# Patient Record
Sex: Female | Born: 1960 | Race: Black or African American | Hispanic: No | Marital: Single | Smoking: Former smoker
Health system: Southern US, Community
[De-identification: ages and names within clinical notes are randomized; demographics above are authoritative.]

## PROBLEM LIST (undated history)

## (undated) DIAGNOSIS — L299 Pruritus, unspecified: Secondary | ICD-10-CM

## (undated) DIAGNOSIS — Z95828 Presence of other vascular implants and grafts: Secondary | ICD-10-CM

## (undated) DIAGNOSIS — K5900 Constipation, unspecified: Secondary | ICD-10-CM

## (undated) DIAGNOSIS — T8859XA Other complications of anesthesia, initial encounter: Secondary | ICD-10-CM

## (undated) DIAGNOSIS — I639 Cerebral infarction, unspecified: Secondary | ICD-10-CM

## (undated) DIAGNOSIS — M1990 Unspecified osteoarthritis, unspecified site: Secondary | ICD-10-CM

## (undated) DIAGNOSIS — Q2112 Patent foramen ovale: Secondary | ICD-10-CM

## (undated) DIAGNOSIS — G4730 Sleep apnea, unspecified: Secondary | ICD-10-CM

## (undated) DIAGNOSIS — Z972 Presence of dental prosthetic device (complete) (partial): Secondary | ICD-10-CM

## (undated) DIAGNOSIS — J189 Pneumonia, unspecified organism: Secondary | ICD-10-CM

## (undated) DIAGNOSIS — G629 Polyneuropathy, unspecified: Secondary | ICD-10-CM

## (undated) DIAGNOSIS — M6281 Muscle weakness (generalized): Secondary | ICD-10-CM

## (undated) DIAGNOSIS — Z9884 Bariatric surgery status: Secondary | ICD-10-CM

## (undated) DIAGNOSIS — Z8719 Personal history of other diseases of the digestive system: Secondary | ICD-10-CM

## (undated) DIAGNOSIS — C50919 Malignant neoplasm of unspecified site of unspecified female breast: Secondary | ICD-10-CM

## (undated) DIAGNOSIS — R109 Unspecified abdominal pain: Secondary | ICD-10-CM

## (undated) DIAGNOSIS — M2550 Pain in unspecified joint: Secondary | ICD-10-CM

## (undated) DIAGNOSIS — I2699 Other pulmonary embolism without acute cor pulmonale: Secondary | ICD-10-CM

## (undated) DIAGNOSIS — Z0389 Encounter for observation for other suspected diseases and conditions ruled out: Secondary | ICD-10-CM

## (undated) DIAGNOSIS — Z923 Personal history of irradiation: Secondary | ICD-10-CM

## (undated) DIAGNOSIS — M797 Fibromyalgia: Secondary | ICD-10-CM

## (undated) DIAGNOSIS — I82629 Acute embolism and thrombosis of deep veins of unspecified upper extremity: Secondary | ICD-10-CM

## (undated) DIAGNOSIS — Z8701 Personal history of pneumonia (recurrent): Secondary | ICD-10-CM

## (undated) DIAGNOSIS — T4145XA Adverse effect of unspecified anesthetic, initial encounter: Secondary | ICD-10-CM

## (undated) DIAGNOSIS — G8929 Other chronic pain: Secondary | ICD-10-CM

## (undated) DIAGNOSIS — Q211 Atrial septal defect: Secondary | ICD-10-CM

## (undated) DIAGNOSIS — R51 Headache: Secondary | ICD-10-CM

## (undated) DIAGNOSIS — M549 Dorsalgia, unspecified: Secondary | ICD-10-CM

## (undated) DIAGNOSIS — D649 Anemia, unspecified: Secondary | ICD-10-CM

## (undated) DIAGNOSIS — Z9289 Personal history of other medical treatment: Secondary | ICD-10-CM

## (undated) DIAGNOSIS — K219 Gastro-esophageal reflux disease without esophagitis: Secondary | ICD-10-CM

## (undated) DIAGNOSIS — Z1379 Encounter for other screening for genetic and chromosomal anomalies: Secondary | ICD-10-CM

## (undated) DIAGNOSIS — Z8619 Personal history of other infectious and parasitic diseases: Secondary | ICD-10-CM

## (undated) DIAGNOSIS — Z98811 Dental restoration status: Secondary | ICD-10-CM

## (undated) DIAGNOSIS — Z9221 Personal history of antineoplastic chemotherapy: Secondary | ICD-10-CM

## (undated) DIAGNOSIS — I10 Essential (primary) hypertension: Secondary | ICD-10-CM

## (undated) HISTORY — PX: UTERINE FIBROID SURGERY: SHX826

## (undated) HISTORY — DX: Muscle weakness (generalized): M62.81

## (undated) HISTORY — DX: Unspecified abdominal pain: R10.9

## (undated) HISTORY — DX: Encounter for observation for other suspected diseases and conditions ruled out: Z03.89

## (undated) HISTORY — DX: Bariatric surgery status: Z98.84

## (undated) HISTORY — DX: Unspecified osteoarthritis, unspecified site: M19.90

## (undated) HISTORY — PX: NAILBED REPAIR: SHX5028

## (undated) HISTORY — DX: Acute embolism and thrombosis of deep veins of unspecified upper extremity: I82.629

## (undated) HISTORY — DX: Essential (primary) hypertension: I10

## (undated) HISTORY — DX: Polyneuropathy, unspecified: G62.9

## (undated) HISTORY — DX: Personal history of other diseases of the digestive system: Z87.19

## (undated) HISTORY — DX: Pruritus, unspecified: L29.9

## (undated) HISTORY — PX: SHOULDER SURGERY: SHX246

## (undated) HISTORY — DX: Patent foramen ovale: Q21.12

## (undated) HISTORY — DX: Constipation, unspecified: K59.00

## (undated) HISTORY — DX: Personal history of pneumonia (recurrent): Z87.01

## (undated) HISTORY — DX: Dorsalgia, unspecified: M54.9

## (undated) HISTORY — DX: Pain in unspecified joint: M25.50

## (undated) HISTORY — PX: CARPAL TUNNEL RELEASE: SHX101

## (undated) HISTORY — DX: Fibromyalgia: M79.7

## (undated) HISTORY — DX: Personal history of other medical treatment: Z92.89

## (undated) HISTORY — DX: Atrial septal defect: Q21.1

## (undated) HISTORY — DX: Encounter for other screening for genetic and chromosomal anomalies: Z13.79

---
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
## 1986-12-20 HISTORY — PX: OTHER SURGICAL HISTORY: SHX169

## 1988-05-21 HISTORY — PX: CHOLECYSTECTOMY: SHX55

## 1993-03-21 HISTORY — PX: HEMORRHOID SURGERY: SHX153

## 1995-07-20 HISTORY — PX: TONSILLECTOMY: SUR1361

## 1997-08-19 HISTORY — PX: HEEL SPUR SURGERY: SHX665

## 1997-11-11 ENCOUNTER — Other Ambulatory Visit: Admission: RE | Admit: 1997-11-11 | Discharge: 1997-11-11 | Payer: Self-pay | Admitting: Gynecology

## 1997-11-18 HISTORY — PX: ABDOMINAL HYSTERECTOMY: SHX81

## 1997-12-14 ENCOUNTER — Encounter: Admission: RE | Admit: 1997-12-14 | Discharge: 1997-12-14 | Payer: Self-pay | Admitting: *Deleted

## 1997-12-14 ENCOUNTER — Inpatient Hospital Stay (HOSPITAL_COMMUNITY): Admission: RE | Admit: 1997-12-14 | Discharge: 1997-12-16 | Payer: Self-pay | Admitting: Gynecology

## 1999-05-03 ENCOUNTER — Encounter: Admission: RE | Admit: 1999-05-03 | Discharge: 1999-05-03 | Payer: Self-pay | Admitting: Family Medicine

## 1999-05-03 ENCOUNTER — Encounter: Payer: Self-pay | Admitting: Family Medicine

## 1999-05-17 ENCOUNTER — Encounter: Payer: Self-pay | Admitting: Family Medicine

## 1999-05-17 ENCOUNTER — Ambulatory Visit (HOSPITAL_COMMUNITY): Admission: RE | Admit: 1999-05-17 | Discharge: 1999-05-17 | Payer: Self-pay | Admitting: Family Medicine

## 1999-12-13 ENCOUNTER — Emergency Department (HOSPITAL_COMMUNITY): Admission: EM | Admit: 1999-12-13 | Discharge: 1999-12-13 | Payer: Self-pay | Admitting: *Deleted

## 2000-02-13 ENCOUNTER — Encounter: Payer: Self-pay | Admitting: Obstetrics and Gynecology

## 2000-02-14 ENCOUNTER — Encounter: Payer: Self-pay | Admitting: Anesthesiology

## 2000-02-14 ENCOUNTER — Observation Stay (HOSPITAL_COMMUNITY): Admission: RE | Admit: 2000-02-14 | Discharge: 2000-02-15 | Payer: Self-pay | Admitting: Obstetrics and Gynecology

## 2000-02-14 HISTORY — PX: LAPAROSCOPIC LYSIS INTESTINAL ADHESIONS: SUR778

## 2000-09-10 ENCOUNTER — Encounter: Payer: Self-pay | Admitting: Family Medicine

## 2000-09-10 ENCOUNTER — Inpatient Hospital Stay (HOSPITAL_COMMUNITY): Admission: EM | Admit: 2000-09-10 | Discharge: 2000-09-13 | Payer: Self-pay | Admitting: Family Medicine

## 2001-05-21 DIAGNOSIS — IMO0001 Reserved for inherently not codable concepts without codable children: Secondary | ICD-10-CM

## 2001-05-21 HISTORY — DX: Reserved for inherently not codable concepts without codable children: IMO0001

## 2001-10-13 ENCOUNTER — Ambulatory Visit (HOSPITAL_BASED_OUTPATIENT_CLINIC_OR_DEPARTMENT_OTHER): Admission: RE | Admit: 2001-10-13 | Discharge: 2001-10-13 | Payer: Self-pay | Admitting: Family Medicine

## 2002-03-02 ENCOUNTER — Encounter: Payer: Self-pay | Admitting: Emergency Medicine

## 2002-03-02 ENCOUNTER — Inpatient Hospital Stay (HOSPITAL_COMMUNITY): Admission: EM | Admit: 2002-03-02 | Discharge: 2002-03-06 | Payer: Self-pay | Admitting: Emergency Medicine

## 2002-03-04 HISTORY — PX: CARDIAC CATHETERIZATION: SHX172

## 2002-03-06 ENCOUNTER — Encounter: Payer: Self-pay | Admitting: Internal Medicine

## 2002-03-13 ENCOUNTER — Encounter: Payer: Self-pay | Admitting: Family Medicine

## 2002-03-13 ENCOUNTER — Ambulatory Visit (HOSPITAL_COMMUNITY): Admission: RE | Admit: 2002-03-13 | Discharge: 2002-03-13 | Payer: Self-pay | Admitting: Family Medicine

## 2002-12-20 DIAGNOSIS — I639 Cerebral infarction, unspecified: Secondary | ICD-10-CM

## 2002-12-20 HISTORY — DX: Cerebral infarction, unspecified: I63.9

## 2003-01-05 ENCOUNTER — Encounter: Payer: Self-pay | Admitting: Emergency Medicine

## 2003-01-06 ENCOUNTER — Inpatient Hospital Stay (HOSPITAL_COMMUNITY): Admission: EM | Admit: 2003-01-06 | Discharge: 2003-01-08 | Payer: Self-pay | Admitting: Emergency Medicine

## 2003-01-06 ENCOUNTER — Encounter (INDEPENDENT_AMBULATORY_CARE_PROVIDER_SITE_OTHER): Payer: Self-pay | Admitting: *Deleted

## 2003-01-07 ENCOUNTER — Encounter: Payer: Self-pay | Admitting: Internal Medicine

## 2003-01-08 ENCOUNTER — Inpatient Hospital Stay (HOSPITAL_COMMUNITY)
Admission: RE | Admit: 2003-01-08 | Discharge: 2003-01-22 | Payer: Self-pay | Admitting: Physical Medicine & Rehabilitation

## 2003-01-20 ENCOUNTER — Encounter: Payer: Self-pay | Admitting: Physical Medicine & Rehabilitation

## 2003-01-27 ENCOUNTER — Encounter
Admission: RE | Admit: 2003-01-27 | Discharge: 2003-04-27 | Payer: Self-pay | Admitting: Physical Medicine & Rehabilitation

## 2003-03-04 ENCOUNTER — Ambulatory Visit: Admission: RE | Admit: 2003-03-04 | Discharge: 2003-03-04 | Payer: Self-pay | Admitting: Family Medicine

## 2003-03-10 ENCOUNTER — Ambulatory Visit (HOSPITAL_COMMUNITY): Admission: RE | Admit: 2003-03-10 | Discharge: 2003-03-10 | Payer: Self-pay | Admitting: Family Medicine

## 2003-03-10 ENCOUNTER — Encounter: Payer: Self-pay | Admitting: Family Medicine

## 2003-03-21 ENCOUNTER — Inpatient Hospital Stay (HOSPITAL_COMMUNITY): Admission: EM | Admit: 2003-03-21 | Discharge: 2003-03-26 | Payer: Self-pay | Admitting: *Deleted

## 2003-06-27 ENCOUNTER — Ambulatory Visit (HOSPITAL_COMMUNITY): Admission: RE | Admit: 2003-06-27 | Discharge: 2003-06-27 | Payer: Self-pay | Admitting: Neurology

## 2003-06-29 ENCOUNTER — Inpatient Hospital Stay (HOSPITAL_COMMUNITY): Admission: EM | Admit: 2003-06-29 | Discharge: 2003-07-02 | Payer: Self-pay | Admitting: Emergency Medicine

## 2003-07-09 ENCOUNTER — Ambulatory Visit (HOSPITAL_COMMUNITY): Admission: RE | Admit: 2003-07-09 | Discharge: 2003-07-09 | Payer: Self-pay | Admitting: Family Medicine

## 2003-07-22 ENCOUNTER — Encounter (INDEPENDENT_AMBULATORY_CARE_PROVIDER_SITE_OTHER): Payer: Self-pay | Admitting: *Deleted

## 2003-07-23 ENCOUNTER — Emergency Department (HOSPITAL_COMMUNITY): Admission: EM | Admit: 2003-07-23 | Discharge: 2003-07-23 | Payer: Self-pay

## 2003-07-26 ENCOUNTER — Encounter: Admission: RE | Admit: 2003-07-26 | Discharge: 2003-10-24 | Payer: Self-pay | Admitting: Family Medicine

## 2003-08-07 ENCOUNTER — Ambulatory Visit (HOSPITAL_COMMUNITY): Admission: RE | Admit: 2003-08-07 | Discharge: 2003-08-07 | Payer: Self-pay | Admitting: Neurosurgery

## 2003-08-10 ENCOUNTER — Ambulatory Visit (HOSPITAL_COMMUNITY): Admission: RE | Admit: 2003-08-10 | Discharge: 2003-08-10 | Payer: Self-pay | Admitting: *Deleted

## 2003-08-10 ENCOUNTER — Encounter (INDEPENDENT_AMBULATORY_CARE_PROVIDER_SITE_OTHER): Payer: Self-pay | Admitting: *Deleted

## 2003-09-06 ENCOUNTER — Ambulatory Visit (HOSPITAL_COMMUNITY): Admission: RE | Admit: 2003-09-06 | Discharge: 2003-09-06 | Payer: Self-pay | Admitting: Internal Medicine

## 2003-11-17 ENCOUNTER — Encounter (HOSPITAL_COMMUNITY): Admission: RE | Admit: 2003-11-17 | Discharge: 2004-02-15 | Payer: Self-pay | Admitting: Family Medicine

## 2003-12-16 ENCOUNTER — Emergency Department (HOSPITAL_COMMUNITY): Admission: EM | Admit: 2003-12-16 | Discharge: 2003-12-17 | Payer: Self-pay | Admitting: Emergency Medicine

## 2003-12-16 ENCOUNTER — Encounter: Admission: RE | Admit: 2003-12-16 | Discharge: 2004-03-15 | Payer: Self-pay | Admitting: *Deleted

## 2004-03-08 ENCOUNTER — Ambulatory Visit (HOSPITAL_COMMUNITY): Admission: RE | Admit: 2004-03-08 | Discharge: 2004-03-08 | Payer: Self-pay | Admitting: Neurosurgery

## 2004-07-20 ENCOUNTER — Ambulatory Visit (HOSPITAL_COMMUNITY): Admission: RE | Admit: 2004-07-20 | Discharge: 2004-07-20 | Payer: Self-pay | Admitting: Family Medicine

## 2004-08-04 ENCOUNTER — Encounter (HOSPITAL_COMMUNITY): Admission: RE | Admit: 2004-08-04 | Discharge: 2004-11-02 | Payer: Self-pay | Admitting: Family Medicine

## 2004-08-09 ENCOUNTER — Observation Stay (HOSPITAL_COMMUNITY): Admission: RE | Admit: 2004-08-09 | Discharge: 2004-08-10 | Payer: Self-pay | Admitting: Orthopedic Surgery

## 2004-08-09 HISTORY — PX: ELBOW SURGERY: SHX618

## 2004-10-05 ENCOUNTER — Encounter: Admission: RE | Admit: 2004-10-05 | Discharge: 2005-01-03 | Payer: Self-pay | Admitting: Neurosurgery

## 2004-11-11 ENCOUNTER — Encounter: Admission: RE | Admit: 2004-11-11 | Discharge: 2004-11-11 | Payer: Self-pay | Admitting: Neurosurgery

## 2004-12-06 ENCOUNTER — Ambulatory Visit: Payer: Self-pay | Admitting: Cardiology

## 2005-01-09 ENCOUNTER — Observation Stay (HOSPITAL_COMMUNITY): Admission: RE | Admit: 2005-01-09 | Discharge: 2005-01-11 | Payer: Self-pay | Admitting: Orthopedic Surgery

## 2005-01-19 HISTORY — PX: CERVICAL SPINE SURGERY: SHX589

## 2005-02-03 ENCOUNTER — Inpatient Hospital Stay (HOSPITAL_COMMUNITY): Admission: AD | Admit: 2005-02-03 | Discharge: 2005-02-08 | Payer: Self-pay | Admitting: Neurosurgery

## 2005-02-05 HISTORY — PX: ANTERIOR CERVICAL DECOMP/DISCECTOMY FUSION: SHX1161

## 2005-02-28 ENCOUNTER — Ambulatory Visit: Payer: Self-pay | Admitting: Cardiology

## 2005-03-13 ENCOUNTER — Encounter: Admission: RE | Admit: 2005-03-13 | Discharge: 2005-03-13 | Payer: Self-pay | Admitting: Neurosurgery

## 2005-06-21 DIAGNOSIS — Z9289 Personal history of other medical treatment: Secondary | ICD-10-CM

## 2005-06-21 HISTORY — DX: Personal history of other medical treatment: Z92.89

## 2005-07-31 ENCOUNTER — Encounter: Admission: RE | Admit: 2005-07-31 | Discharge: 2005-07-31 | Payer: Self-pay | Admitting: Neurosurgery

## 2005-09-26 ENCOUNTER — Ambulatory Visit (HOSPITAL_COMMUNITY): Admission: RE | Admit: 2005-09-26 | Discharge: 2005-09-26 | Payer: Self-pay | Admitting: Family Medicine

## 2005-09-28 ENCOUNTER — Ambulatory Visit (HOSPITAL_COMMUNITY): Admission: RE | Admit: 2005-09-28 | Discharge: 2005-09-28 | Payer: Self-pay | Admitting: Neurosurgery

## 2005-10-25 ENCOUNTER — Emergency Department (HOSPITAL_COMMUNITY): Admission: EM | Admit: 2005-10-25 | Discharge: 2005-10-25 | Payer: Self-pay | Admitting: Emergency Medicine

## 2005-11-19 ENCOUNTER — Ambulatory Visit (HOSPITAL_COMMUNITY): Admission: RE | Admit: 2005-11-19 | Discharge: 2005-11-19 | Payer: Self-pay | Admitting: Orthopedic Surgery

## 2006-04-25 ENCOUNTER — Ambulatory Visit (HOSPITAL_BASED_OUTPATIENT_CLINIC_OR_DEPARTMENT_OTHER): Admission: RE | Admit: 2006-04-25 | Discharge: 2006-04-25 | Payer: Self-pay | Admitting: Orthopedic Surgery

## 2006-04-25 HISTORY — PX: TRIGGER FINGER RELEASE: SHX641

## 2006-04-29 ENCOUNTER — Other Ambulatory Visit: Admission: RE | Admit: 2006-04-29 | Discharge: 2006-04-29 | Payer: Self-pay | Admitting: Family Medicine

## 2006-06-04 ENCOUNTER — Ambulatory Visit (HOSPITAL_COMMUNITY): Admission: RE | Admit: 2006-06-04 | Discharge: 2006-06-04 | Payer: Self-pay | Admitting: Family Medicine

## 2006-06-10 ENCOUNTER — Ambulatory Visit: Payer: Self-pay | Admitting: Cardiology

## 2006-09-30 ENCOUNTER — Ambulatory Visit (HOSPITAL_COMMUNITY): Admission: RE | Admit: 2006-09-30 | Discharge: 2006-09-30 | Payer: Self-pay | Admitting: Family Medicine

## 2007-02-03 ENCOUNTER — Ambulatory Visit: Payer: Self-pay | Admitting: Oncology

## 2007-05-22 HISTORY — PX: GASTRIC ROUX-EN-Y: SHX5262

## 2007-05-22 HISTORY — PX: VENA CAVA FILTER PLACEMENT: SUR1032

## 2007-06-17 ENCOUNTER — Encounter (HOSPITAL_COMMUNITY): Admission: RE | Admit: 2007-06-17 | Discharge: 2007-09-15 | Payer: Self-pay | Admitting: Family Medicine

## 2007-10-02 ENCOUNTER — Ambulatory Visit (HOSPITAL_COMMUNITY): Admission: RE | Admit: 2007-10-02 | Discharge: 2007-10-02 | Payer: Self-pay | Admitting: Family Medicine

## 2007-11-24 ENCOUNTER — Other Ambulatory Visit: Admission: RE | Admit: 2007-11-24 | Discharge: 2007-11-24 | Payer: Self-pay | Admitting: Family Medicine

## 2008-06-16 ENCOUNTER — Encounter: Admission: RE | Admit: 2008-06-16 | Discharge: 2008-06-16 | Payer: Self-pay | Admitting: General Surgery

## 2008-06-16 ENCOUNTER — Encounter (INDEPENDENT_AMBULATORY_CARE_PROVIDER_SITE_OTHER): Payer: Self-pay | Admitting: *Deleted

## 2008-07-16 DIAGNOSIS — Z8669 Personal history of other diseases of the nervous system and sense organs: Secondary | ICD-10-CM | POA: Insufficient documentation

## 2008-07-16 DIAGNOSIS — IMO0001 Reserved for inherently not codable concepts without codable children: Secondary | ICD-10-CM | POA: Insufficient documentation

## 2008-07-16 DIAGNOSIS — Z86711 Personal history of pulmonary embolism: Secondary | ICD-10-CM | POA: Insufficient documentation

## 2008-07-16 DIAGNOSIS — J45909 Unspecified asthma, uncomplicated: Secondary | ICD-10-CM | POA: Insufficient documentation

## 2008-07-16 DIAGNOSIS — D259 Leiomyoma of uterus, unspecified: Secondary | ICD-10-CM | POA: Insufficient documentation

## 2008-07-20 ENCOUNTER — Ambulatory Visit: Payer: Self-pay | Admitting: Internal Medicine

## 2008-07-20 DIAGNOSIS — R109 Unspecified abdominal pain: Secondary | ICD-10-CM | POA: Insufficient documentation

## 2008-09-15 ENCOUNTER — Encounter: Payer: Self-pay | Admitting: Internal Medicine

## 2008-10-06 ENCOUNTER — Ambulatory Visit (HOSPITAL_COMMUNITY): Admission: RE | Admit: 2008-10-06 | Discharge: 2008-10-06 | Payer: Self-pay | Admitting: Family Medicine

## 2008-10-22 ENCOUNTER — Observation Stay (HOSPITAL_COMMUNITY): Admission: RE | Admit: 2008-10-22 | Discharge: 2008-10-25 | Payer: Self-pay | Admitting: Surgery

## 2008-10-22 ENCOUNTER — Encounter (INDEPENDENT_AMBULATORY_CARE_PROVIDER_SITE_OTHER): Payer: Self-pay | Admitting: Surgery

## 2008-10-22 HISTORY — PX: ENTEROLYSIS: SUR452

## 2008-10-22 HISTORY — PX: APPENDECTOMY: SHX54

## 2008-11-05 ENCOUNTER — Encounter: Payer: Self-pay | Admitting: Internal Medicine

## 2009-06-20 ENCOUNTER — Encounter: Admission: RE | Admit: 2009-06-20 | Discharge: 2009-06-20 | Payer: Self-pay | Admitting: Orthopedic Surgery

## 2009-06-21 ENCOUNTER — Ambulatory Visit (HOSPITAL_BASED_OUTPATIENT_CLINIC_OR_DEPARTMENT_OTHER): Admission: RE | Admit: 2009-06-21 | Discharge: 2009-06-21 | Payer: Self-pay | Admitting: Orthopedic Surgery

## 2009-06-21 HISTORY — PX: CARPAL TUNNEL RELEASE: SHX101

## 2009-11-01 ENCOUNTER — Ambulatory Visit (HOSPITAL_COMMUNITY): Admission: RE | Admit: 2009-11-01 | Discharge: 2009-11-01 | Payer: Self-pay | Admitting: Family Medicine

## 2010-06-10 ENCOUNTER — Encounter: Payer: Self-pay | Admitting: Neurology

## 2010-06-20 NOTE — Assessment & Plan Note (Signed)
Summary: LOWER PELVIC PN/FH   History of Present Illness Visit Type: consult Primary GI MD: Yancey Flemings MD Primary Provider: Johny Blamer, MD Requesting Provider: Claud Kelp, MD Chief Complaint: chronic pelvic pain History of Present Illness:   50 year old female sent by Dr. Derrell Lolling regarding chronic pelvic pain. 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 Additionally,The patient has had multiple surgeries including, but not limited to, hysterectomy, cholecystectomy, bilateral salpingo-oophorectomy, laparotomy with lysis of adhesions, and most recently, as mentioned, Roux-en-Y gastric bypass at [facility] in January 2009. She presents now with an 18 month history of chronic daily pelvic pain. The pain, which is principally in the suprapubic and right lower quadrant, is quite similar to problems that she had in 2000 and subsequently underwent laparotomy with lysis of adhesions in 2001 with complete resolution of pain. The current pain is focal and unaffected by activity, meals, urination, or defecation. She denies any active GI symptoms. No true abdominal pain. Bowel habits are regular without bleeding. Colonoscopy in 2000 for this same type of pain was entirely normal(save hemorrhoids). There has been no interval development of colon cancer in the family. She is accompanied by her mother. She has lost 127 pounds since her bypass surgery. Her gynecologist felt that her pain was likely due to adhesions. However, because of her gastric bypass and multiple abdominal surgeries he referred her to general surgery. CT Scan of the abdomen and pelvis was performed and revealed no significant abnormalities. Dr. Derrell Lolling is not planning surgery and has subsequently referred her to this office for GI evaluation.   GI Review of Systems      Denies abdominal pain, acid reflux, belching, bloating, chest  pain, dysphagia with liquids, dysphagia with solids, heartburn, loss of appetite, nausea, vomiting, vomiting blood, weight loss, and  weight gain.        Denies anal fissure, black tarry stools, change in bowel habit, constipation, diarrhea, diverticulosis, fecal incontinence, heme positive stool, hemorrhoids, irritable bowel syndrome, jaundice, light color stool, liver problems, rectal bleeding, and  rectal pain.   Prior Medications Reviewed Using: Patient Recall  Updated Prior Medication List: CITRACAL PETITES/VITAMIN D 200-250 MG-UNIT TABS (CALCIUM CITRATE-VITAMIN D) three times a day CENTRUM  CHEW (MULTIPLE VITAMINS-MINERALS) once daily VITAMIN B-12 2500 MCG SUBL (CYANOCOBALAMIN) once daily B COMPLEX  CAPS (B COMPLEX VITAMINS) two times a day  Current Allergies (reviewed today): ! DARVOCET ! * OXYCODONE ! * OXYCOTIN Past Medical History:    Current Problems:     CARPAL TUNNEL SYNDROME, HX OF (ICD-V12.49)    ASTHMA (ICD-493.90)    FIBROMYALGIA (ICD-729.1)    FIBROIDS, UTERUS (ICD-218.9)    PULMONARY EMBOLISM, HX OF (ICD-V12.51)    CVA (ICD-434.91)    Hypertension    Obesity    Sleep Apnea  Past Surgical History:    Bunionectomy    Open cholecystectomy 1990    Carpal Tunnel Surgery Rt. 1993    Lt. Heal Surgery 1999    Hysterectomy 1999    Laparotomy scar tissue removal 2001    Tonsillectomy 1997    Lt. Carpal Tunnel Surgery 2006    Inferior vana Cava Filter placed in Jan. 2009    Bariatric surgery    Hemorrhoidectomy    Tubal Ligation    Shoulder Surgery   Family History:    Family History of Breast Cancer:Grandmother    Family History of Ovarian CancerGrandmother    Family History of  Diabetes:     Family History of Kidney Disease:Aunt    Family History of Prostate Cancer:Father  Social History:    Occupation: Disabled    Patient has never smoked.     Alcohol Use - no    Daily Caffeine Use -1    Illicit Drug Use - no  Risk Factors:  Tobacco use:   never Drug use:  no Alcohol use:  no  Vital Signs:  Patient Profile:   50 Years Old Female Height:     61 inches Weight:      191.25 pounds BMI:     36.27 Pulse rate:   100 / minute Pulse rhythm:   regular BP sitting:   128 / 82  (left arm)  Vitals Entered By: June McMurray CMA (July 20, 2008 1:22 PM)                  Physical Exam  General:     Well developed, well nourished, no acute distress. Head:     Normocephalic and atraumatic. Eyes:     PERRLA, no icterus. Nose:     No deformity, discharge,  or lesions. Mouth:     No deformity or lesions,  Neck:     Supple; no masses or thyromegaly. Lungs:     Clear throughout to auscultation. Heart:     Regular rate and rhythm; no murmurs, rubs,  or bruits. Abdomen:     obese,Soft, nontender and nondistended. No masses, hepatosplenomegaly or hernias noted. Normal bowel sounds.multiple prior surgical incisions well-healed Msk:     Symmetrical with no gross deformities. Normal posture. Pulses:     Normal pulses noted. Extremities:     brace on her right lower extremity Neurologic:      weakness right lower extremity Skin:     Intact without significant lesions or rashes. Psych:     Alert and cooperative. Normal mood and affect.   Impression & Recommendations:  Problem # 1:  PELVIC PAIN, CHRONIC (ICD-789.09) Chronic pelvic pain most likely due to adhesions. This assessment is based on history and lack of other findings. She does not appear to have any primary gastrointestinal disorder to explain the pain. In terms of considering surgery for lysis of adhesions, it may be best to refer her back to her bariatric surgeon at [facility] if there is understandable reluctance here in [place]. This decision will be left to Dr. Jackelyn Knife. She will return to his care.  Problem # 2:  SCREENING COLORECTAL-CANCER (ICD-V76.51) Negative colonoscopy in 2000. Routine screening due at age 4. Sooner for relevant clinical  issues.   Patient Instructions: 1)  copy to: Dr. Claud Kelp, Dr. Lavina Hamman

## 2010-06-20 NOTE — Letter (Signed)
Summary: [facility]   Imported By: Lester [place] 12/13/2008 07:29:12  _____________________________________________________________________  External Attachment:    Type:   Image     Comment:   External Document

## 2010-06-20 NOTE — Letter (Signed)
Summary: Severe Lower Pelvic Pain/[facility]  Severe Lower Pelvic Pain/[facility]   Imported By: Sherian Rein 10/05/2008 13:41:07  _____________________________________________________________________  External Attachment:    Type:   Image     Comment:   External Document

## 2010-06-20 NOTE — Discharge Summary (Signed)
Summary: Discharge summary   NAME:  [name], [name]                          ACCOUNT NO.:  [id number]   MEDICAL RECORD NO.:  [id number]                   PATIENT TYPE:  INP   LOCATION:  6706                                 FACILITY:  [facility]   PHYSICIAN:  Kela Millin, M.D.             DATE OF BIRTH:  11-Jan-1961   DATE OF ADMISSION:  06/28/2003  DATE OF DISCHARGE:  07/02/2003                                 DISCHARGE SUMMARY   DISCHARGE DIAGNOSES:  1. Chest pain, likely secondary to gastroesophageal reflux disease,     myocardial infarction ruled out.  2. Hypokalemia, resolved.  3. Left arm and leg pain, likely secondary to cervical and lumbar spine     degenerative changes as noted on previous imaging studies.  4. Hypertension.  5. Asthma.  6. History of pulmonary embolus.  7. History of cerebrovascular accident.   PROCEDURES:  1. CT angiogram of chest:  Negative for pulmonary embolus.  2. CT scan of head:  Prior pontine infarct noted, no acute intracranial     findings.  3. Lower extremity ultrasound Dopplers:  Negative for DVT.   CONSULTANTS:  1. Cardiology, Meade Maw, M.D.  2. Neurology, Marlan [name], M.D.   HISTORY:  The patient is a 50 year old female with history of CVA and right-  sided hemiparesis and atypical chest pain, who presented with similar  symptoms.  The patient reported she had suffered a CVA in September 2004,  which left her with right-sided weakness.  Since that time she had been on  Coumadin but continued to have problems with weakness on that right side.  In addition, she had a history of atypical chest pain and had had an  extensive workup including endoscopy.  According to the patient, she had a  cardiac catheterization about two years ago and it was negative.  The  patient also had a history of pulmonary embolus about three months prior to  her admission.  She then reports that since that time she had had problems  with left side pain  and weakness.  She had an MRI and MRA done on day prior  to admission, and it did not show any acute changes.  She continued to have  the left arm and leg pain as well as the chest pain, and so she came to the  ER for further evaluation and management.  Cardiac enzymes were done in the  ER, and those were negative.   On initial exam it was noted that the patient appeared to be in mild  distress, though it was thought that it might have been secondary to fatigue  and pain.  Her neurologic exam showed some weakness approximately 4+ in  terms of her grip, flexion and extension on the left upper and lower  extremities.  She had approximately 1+ pitting edema.  The rest of her exam  was noted to be within normal  limits.   Her lab work showed a PT of 18.5, INR of 1.9, sodium of 141, potassium 3.2,  chloride 107, bicarb of 24, BUN 13, creatinine 1.0, glucose 96.  Cardiac  enzymes:  The CK-MB was less than 80, and the troponin was less than 0.05.  All the subsequent cardiac enzymes were negative.   HOSPITAL COURSE:  Problem 1.  CHEST PAIN:  Myocardial infarction was ruled out by cardiac  enzymes, and cardiology was consulted and Dr. Fraser Din confirmed the patient  had a cardiac catheterization done in 2003 and it was normal, and she did  not recommend any further cardiac workup.  The chest pain was thought to be  secondary to esophagitis/gastroesophageal reflux disease, and the patient  was treated with a proton pump inhibitor.   Problem 2.  HYPOKALEMIA:  Her potassium was replaced during her hospital  stay.   Problem 3.  LEFT ARM AND LEG PAIN:  This was thought to be secondary to  cervical and lumbar spine degenerative changes noted on previous imaging  studies.  Neurology was consulted for further evaluation and  recommendations, and Dr. Anne Hahn stated that he will not pursue any further  workup of the left-sided pain syndrome.  The pain was thus managed  symptomatically during her hospital  stay.  As stated above, the patient had  had an MRI/MRA just prior to admission and also a CT scan of the brain had  been done on admission, and the results are as stated above; no acute  changes.  The patient was to follow up with Dr. Clarisse Gouge, the outpatient  neurologist.   Problem 4.  HYPERTENSION:  Controlled.  The patient was maintained on  Norvasc during her hospital stay.   Problem 5.  ASTHMA:  The patient was maintained on her Advair during her  hospital stay.   Problem 6.  HISTORY OF PULMONARY EMBOLUS:  The patient was maintained no  Coumadin during her hospital stay, and her INR was therapeutic.   Problem 7.  HISTORY OF CEREBROVASCULAR ACCIDENT:  As discussed above.   CONDITION ON DISCHARGE:  Good.   DISCHARGE MEDICATIONS:  1. Patient to continue all preadmission medications.  2. Prevacid 30 mg one p.o. daily.   DISCHARGE DIET:  Low-cholesterol, low-fat diet.   FOLLOW-UP CARE:  1. Holley Bouche, M.D., in one to two weeks.  2. Dr. Fraser Din in three weeks or as scheduled.  3. Rene Kocher, M.D., neurologist, as scheduled.                                                Kela Millin, M.D.    ACV/MEDQ  D:  07/22/2003  T:  07/23/2003  Job:  [id number]   cc:   Juluis Mire, M.D.  [address]  [place]  [place] [address]  Fax: [phone]   Meade Maw, M.D.  [address]  [place]  [place] [address]  Fax: [phone]   Rene Kocher, M.D.  [address]  [place]  [place] [address]  Fax: [phone]

## 2010-08-06 LAB — BASIC METABOLIC PANEL
BUN: 11 mg/dL (ref 6–23)
CO2: 29 mEq/L (ref 19–32)
Calcium: 9.3 mg/dL (ref 8.4–10.5)
Chloride: 103 mEq/L (ref 96–112)
Creatinine, Ser: 0.7 mg/dL (ref 0.4–1.2)
GFR calc Af Amer: >60 mL/min (ref >60)
GFR calc non Af Amer: >60 mL/min (ref >60)
Glucose, Bld: 88 mg/dL (ref 70–99)
Potassium: 4.8 mEq/L (ref 3.5–5.1)
Sodium: 139 mEq/L (ref 135–145)

## 2010-08-09 LAB — POCT HEMOGLOBIN-HEMACUE: Hemoglobin: 12.9 g/dL (ref 12.0–15.0)

## 2010-08-28 LAB — BASIC METABOLIC PANEL
BUN: 8 mg/dL (ref 6–23)
CO2: 29 mEq/L (ref 19–32)
Calcium: 9.3 mg/dL (ref 8.4–10.5)
Chloride: 106 mEq/L (ref 96–112)
Creatinine, Ser: 0.66 mg/dL (ref 0.4–1.2)
GFR calc Af Amer: >60 mL/min (ref >60)
GFR calc non Af Amer: >60 mL/min (ref >60)
Glucose, Bld: 71 mg/dL (ref 70–99)
Potassium: 4.5 mEq/L (ref 3.5–5.1)
Sodium: 141 mEq/L (ref 135–145)

## 2010-08-28 LAB — CBC
HCT: 34.3 % — ABNORMAL LOW (ref 36.0–46.0)
Hemoglobin: 11.1 g/dL — ABNORMAL LOW (ref 12.0–15.0)
MCHC: 32.4 g/dL (ref 30.0–36.0)
MCV: 93.7 fL (ref 78.0–100.0)
Platelets: 141 10*3/uL — ABNORMAL LOW (ref 150–400)
RBC: 3.66 MIL/uL — ABNORMAL LOW (ref 3.87–5.11)
RDW: 14.8 % (ref 11.5–15.5)
WBC: 5.1 10*3/uL (ref 4.0–10.5)

## 2010-08-28 LAB — DIFFERENTIAL
Basophils Absolute: 0 10*3/uL (ref 0.0–0.1)
Basophils Relative: 0 % (ref 0–1)
Eosinophils Absolute: 0.2 10*3/uL (ref 0.0–0.7)
Eosinophils Relative: 3 % (ref 0–5)
Lymphocytes Relative: 43 % (ref 12–46)
Lymphs Abs: 2.2 10*3/uL (ref 0.7–4.0)
Monocytes Absolute: 0.3 10*3/uL (ref 0.1–1.0)
Monocytes Relative: 6 % (ref 3–12)
Neutro Abs: 2.4 10*3/uL (ref 1.7–7.7)
Neutrophils Relative %: 47 % (ref 43–77)

## 2010-08-28 LAB — HEMOGLOBIN AND HEMATOCRIT, BLOOD
HCT: 38.4 % (ref 36.0–46.0)
Hemoglobin: 12.7 g/dL (ref 12.0–15.0)

## 2010-10-03 NOTE — Discharge Summary (Signed)
[name], [name]              ACCOUNT NO.:  [id number]   MEDICAL RECORD NO.:  [id number]          PATIENT TYPE:  OIB   LOCATION:  1535                         FACILITY:  [facility]   PHYSICIAN:  Thornton Park. Daphine Deutscher, MD  DATE OF BIRTH:  Jul 26, 1960   DATE OF ADMISSION:  10/22/2008  DATE OF DISCHARGE:  10/25/2008                               DISCHARGE SUMMARY   PROCEDURE:  October 22, 2008, laparoscopic enterolysis and laparoscopic  appendectomy.   COURSE IN THE HOSPITAL:  [name] is a 49 year old African  American lady who has undergone a laparoscopic Roux-en-Y gastric bypass  over at [facility] and has been having recurrent bouts of lower abdominal  pain.  She has a history of fibromyalgia, migraine headaches patent  foramen ovale, pulmonary embolism and has a permanent vena caval filter.   She underwent laparoscopy and she was found to have significant  adhesions involving the terminal ileum and going down into her pelvis.  These were laparoscopically lysed as well as a laparoscopic appendectomy  performed.  She did well.  She was a little hesitant about leaving until  she had had some more bowel activity but we were able to advance her  diet and she was ready for discharge on postop day #3.  She can return  to the Dilaudid that she normally takes for her fibromyalgia.  Her  incisions were healing nicely and I asked her to come back to see me in  the office in 2-3 weeks.  Condition improved.      Thornton Park Daphine Deutscher, MD  Electronically Signed     MBM/MEDQ  D:  10/25/2008  T:  10/25/2008  Job:  [id number]

## 2010-10-03 NOTE — Op Note (Signed)
[name], [name]              ACCOUNT NO.:  [id number]   MEDICAL RECORD NO.:  [id number]          PATIENT TYPE:  OIB   LOCATION:  0098                         FACILITY:  [facility]   PHYSICIAN:  Thornton Park. Daphine Deutscher, MD  DATE OF BIRTH:  April 01, 1961   DATE OF PROCEDURE:  10/22/2008  DATE OF DISCHARGE:                               OPERATIVE REPORT   PREOPERATIVE INDICATIONS:  A 50 year old African American lady with a  history of fibromyalgia who was seen and has been evaluated for chronic  pelvic pain that gets worse on the right side.  Informed consent was  obtained regarding laparoscopy, possible laparotomy and the patient had  a bowel prep preoperatively.   FINDINGS:  Significant right-sided pelvic adhesions involving the  terminal ileum and a separate dense band involving the distal sigmoid to  the small bowel.   PROCEDURE:  Laparoscopic abdominal enterolysis and laparoscopic  appendectomy.   SURGEON:  Dr. Luretha Murphy.   ASSISTANT:  Lennie Muckle, MD   ANESTHESIA:  General endotracheal.   DESCRIPTION OF PROCEDURE:  [name] was taken to room 1 on Friday, October 22, 2008 and given general anesthesia.  The abdomen was prepped with a  chlorhexidine equivalent and draped sterilely.  Access to the abdomen  was gained with a 5 mm 0 degree Optiview technique through the left  upper quadrant without difficulty.  Once a peritoneum was in place, I  put two ports on the left side and one port on the right side which were  all 5 mm.   Initial finding was marked adhesions from the omentum stuck to the  anterior abdominal wall.  These were taken down with scissors and with  the Harmonic scalpel.  Following that, the patient was rotated head down  and to the left side and I began by looking at the cecum and then  noticing that the whole portion of the abdomen was the only port that  was really walled off.  I began taking down terminal ileum adhesions and  realized they were stuck to  the pelvic sidewall on the right and then  were stuck to the sigmoid colon.  These were lysed and when this  happened, the sigmoid colon fell away revealing yet another lower loop  of terminal ileum had a focal adhesion way down in the pelvis on the  right sidewall.  When I lysed that, I was able to free the terminal  ileum and bring it up and in doing that, I identified yet another band  to the terminal sigmoid and this was lysed.   I then started at the terminal ileum and worked my way backwards.  There  was one S loop of ileum that was stuck together and I freed that I lysed  that with sharp dissection.  I ran this back and identified the JJ, Roux  limb and the BP limb.  There did not appear to be any evidence of a  Peterson's defect or an internal hernia.  I think her symptoms were  explained on the findings in the pelvis.   Because her  appendix seemed to be lying down in that same vicinity, I  went ahead and mobilized the appendix and then transected it at its base  with a Endo-GIA white cartridge and then transected the mesentery with  the Harmonic scalpel.  The appendix was placed in a bag and brought out  through the now 12 mm port which I had up sized in the left upper  quadrant.  I irrigated and no bleeding was noted.  Everything looked to  be in order.  I went and looked at the colon and saw no evidence of any  injuries there or the small bowel where I had done the lysis.  Everything appeared in order and after irrigating and withdrawing the  irrigant, I went ahead and repaired the 12 mm in the left  upper quadrant with an Endo stitch, 2-0 Vicryl with the Storz endoclose  and a 0 Vicryl.  I injected all the ports with Marcaine and closed the  wounds with 4-0 Vicryl with Benzoin and Steri-Strips.  The patient  seemed to tolerate the procedure well and was taken to recovery room in  satisfactory condition.      Thornton Park Daphine Deutscher, MD  Electronically Signed     MBM/MEDQ   D:  10/22/2008  T:  10/22/2008  Job:  [id number]   cc:   Holley Bouche, M.D.  Fax: [phone]   Zenaida Niece, M.D.  Fax: [phone]   Mirian Mo, MD  [facility]

## 2010-10-06 NOTE — Discharge Summary (Signed)
[name], [name]                           ACCOUNT NO.:  [id number]   MEDICAL RECORD NO.:  [id number]                   PATIENT TYPE:  INP   LOCATION:  3739                                 FACILITY:  [facility]   PHYSICIAN:  Corinna L. Lendell Caprice, MD             DATE OF BIRTH:  02-25-1961   DATE OF ADMISSION:  03/20/2003  DATE OF DISCHARGE:  03/26/2003                                 DISCHARGE SUMMARY   PRIMARY CARE PHYSICIAN:  Holley Bouche, M.D.   DISCHARGE DIAGNOSES:  1. Left lower lobe pulmonary embolus, on Coumadin therapy.  2. Status post old cerebrovascular accident, stable.  3. Severe osteoarthritis bilateral knees, right greater than left, stable.  4. Hypertension, stable.  5. Asthma, stable.   DISCHARGE MEDICATIONS:  1. Coumadin 10 mg on the evening of March 26, 2003.  Coumadin 7.5 mg     q.p.m.  2. Norvasc 5 mg daily.  3. Tylox 5/325 one or two every four to six hours for pain.  4. The patient is instructed not to take aspirin, Advil or Celebrex.  5. The patient's Altace is on hold until she sees Dr. Tiburcio Pea for followup.   ALLERGIES:  ASPIRIN and PREDNISONE.   PROCEDURE:  None.   HISTORY OF PRESENT ILLNESS:  This is a 50 year old female with a recent  history of right brain stem CVA and paralysis presenting with left-sided  chest pain starting in the left axilla with migrations to the left side of  her sternum that would come and go, and advance to constant pain with  shortness of breath, worsening with respirations.  In the ER chest CT scan  revealed presence of PE.  The patient was admitted for further treatment.   HISTORY:  1. Cardiac.  The patient was admitted to telemetry unit.  Her EKG shows     normal sinus rhythm with no ST-T wave changes noted, no significant     ectopy.  She remained in sinus rhythm on telemetry throughout her     hospitalization.   1. Pulmonary embolus.  The patient's chest pain improved but has not     completely resolved at the  time of discharge.  It is worse with movement     and with deep inspiration.  Her O2 saturations are 95% on room air.  Her     INR is therapeutic at 2.  She will be discharged to continue Coumadin at     home to follow up with her primary.  The patient at discharge is not to     take her prescribed Celebrex secondary to interaction with Coumadin.  The     patient's hypercoagulable workup requires outpatient followup.   1. Hypertension.  The patient's hypertension remained stable during this     hospitalization.  She was maintained on Norvasc only rather than Norvasc     and Altace.  She was instructed to hold her  Altace until she follows up     with her primary.   1. Degenerative joint disease.  The patient had orthotics fitted for her     right leg.  This will be delivered to her home in approximately a week     from discharge.  Until then she will be ambulating with a walker.   At the time of discharge the patient is free of any signs or symptoms of  distress.  Temperature is 98.5, blood pressure 128/80, heart rate 65,  respirations 18, room air saturation are 95.   DISCHARGE LABORATORY DATA:  PT 19.3, INR 2, prothrombin-2 is negative.  Factor V gene mutation negative.  ANA negative.  Rheumatoid factor negative.  Sed rate 19.  Antiphospholipid-S _________.  PPTLA 51.6.  dRVTT 48.6.  dRVTT  1.23.  PPTLA confirmation 0.0.  Lupus anticoagulant is not detected.  Protein-S 129, protein-C 89.  Cardiac enzymes were negative.  Homocystine is  9.3, antithrombin III is 84, D-dimer 0.45.   CONSULTS:  None.   CONDITION ON DISCHARGE:  Good.   DISPOSITION:  Discharge to home.   FOLLOW UP:  The patient is scheduled for followup with Dr. Laurann Montana on  November __________, 2004 at 3 p.m.  Dr. Tiburcio Pea will not be in the office  that day.      Ellender Hose. Earlene Plater, N.P.                    Corinna L. Lendell Caprice, MD    SMD/MEDQ  D:  03/26/2003  T:  03/27/2003  Job:  [id number]   cc:   Holley Bouche, M.D.  [address]  [place], [place] [address]  Fax: [phone]

## 2010-10-06 NOTE — Op Note (Signed)
[name], [name]              ACCOUNT NO.:  [id number]   MEDICAL RECORD NO.:  [id number]          PATIENT TYPE:  INP   LOCATION:  NA                           FACILITY:  [facility]   PHYSICIAN:  Marlowe Kays, M.D.  DATE OF BIRTH:  1960/08/13   DATE OF PROCEDURE:  01/10/2005  DATE OF DISCHARGE:                                 OPERATIVE REPORT   PREOPERATIVE DIAGNOSES:  1.  Left carpal tunnel syndrome.  2.  Residual painful nail, great toe, bilaterally.   POSTOPERATIVE DIAGNOSES:  1.  Left carpal tunnel syndrome.  2.  Residual painful nail, great toe, bilaterally.   OPERATIONS:  1.  Left carpal tunnel release.  2.  Excision residual nail and matrix, great toe, bilateral.   SURGEON:  Marlowe Kays, M.D.   ASSISTANT:  Nurse.   ANESTHESIA:  General.   INDICATIONS FOR PROCEDURE:  She had signs and symptoms of carpal tunnel  syndrome documented with nerve conduction studies being abnormal for the  median nerve. She has had a previous history of nail and matrix removed from  her great toes with small residual portions of nail at the bases medially  and laterally. She desires to have these removed as well.   PROCEDURE:  Under satisfactory general anesthesia and pneumatic tourniquet,  the left upper extremity, with the arm Esmarched out nonsterilely and  prepped with DuraPrep from mid-forearm to fingertips, was draped into a  sterile field. Both forefeet were prepped also with DuraPrep and draped into  a sterile field.  I first in the left great toe used a 0.5-inch Penrose  drain for hemostasis and made the vertical incisions along the nail bed  lines elevating up the eponychium.  The residual nail was removed with sharp  dissection and then the nail bed scraped with a small curette, removing all  residual nail.  I then closed the flap with interrupted 4-0 nylon, blocked  the toe with 0.25% Marcaine and dressed it with Betadine and Adaptic after  removing the Penrose  tourniquet. I then did an identical procedure on the  right great toe. Following this, I went to the hand where I marked out a  curved incision along the base of the tendon eminence, crossing obliquely  over the flexor crease of wrist and the distal forearm.  The palmaris longus  tendon was identified and retracting radially, I then cautiously dissected  down finding the median nerve.  Potential bleeders were coagulated with  bipolar cautery.  I then began releasing the skin, subcutaneous tissue, and  thick fascia, including some muscle tissue, working as far laterally as I  could to get just the lateral portions of it into the distal palm.  When the  decompression was completed in the mid palm. the median nerve was  conservatively thinned out and discolored. When the decompression was  completed, I released the tourniquet and coagulated several minimal bleeders  with bipolar cautery.  The wound  was irrigated well with sterile saline.  The soft tissue was  infiltrated with 0.25% Marcaine and the wound closed with interrupted 4-0  nylon in the  skin and subcutaneous tissue only. A Betadine and Adaptic  dressing and a volar plaster splint were applied. She tolerated the  procedure well and was taken to the recovery room in satisfactory condition  with no known complications.           ______________________________  Marlowe Kays, M.D.     JA/MEDQ  D:  01/10/2005  T:  01/10/2005  Job:  [id number]

## 2010-10-06 NOTE — Discharge Summary (Signed)
[name], [name]                           ACCOUNT NO.:  [id number]   MEDICAL RECORD NO.:  [id number]                   PATIENT TYPE:  INP   LOCATION:  0382                                 FACILITY:  [facility]   PHYSICIAN:  Sherin Quarry, MD                   DATE OF BIRTH:  03-18-1961   DATE OF ADMISSION:  01/05/2003  DATE OF DISCHARGE:  01/08/2003                                 DISCHARGE SUMMARY   HISTORY:  [name] is a 50 year old lady with a history of  hypertension, who presented on August 17, with a one day history of right-  sided weakness and slurred speech associated with headache.  Her sister  reports that she was dragging her right foot and that she insisted because  of the slurred speech that she go to the emergency room.   PHYSICAL EXAMINATION:  VITAL SIGNS:  In the emergency room, a physical  examination as described by Jackie Plum, M.D., the blood pressure was  190/103.  The pulse was 52, respirations 20, temperature 98.3.  HEENT:  Within normal limits.  CHEST:  Clear.  BACK:  Revealed no CVA or point tenderness.  CARDIOVASCULAR:  Normal S1 and S2 without rubs, murmurs, or gallops.  ABDOMEN:  Obese.  There were normal bowel sounds without masses, tenderness,  or organomegaly.  NEUROLOGIC TESTING AND EXAMINATION OF EXTREMITIES:  Remarkable for the  neurologic exam.  This revealed 3/5 strength in the right deltoid, biceps,  and triceps with diminished grip and difficulty doing finger-to-nose-finger  testing.  The patient also exhibited slurred speech.  The right leg  musculature was also assessed to be about 3/5 strength.   RELEVANT LABORATORY STUDIES OBTAINED:  CBC revealed a white count of 6900,  hemoglobin 12.7.  The PT was within normal limits.  The serum creatinine was  initially reported to be 3 but when this specimen was repeated, the  creatinine was 0.7.  BUN was 11.  Potassium was 3.3.  CO2 was 27.  Liver  profile was normal.  Homocystine level  was 8.8.  Lipid profile revealed a  cholesterol of 187.  The LDL was 114.  The carotid Doppler study revealed  mild plaque throughout bilaterally with no significant stenosis.  Echocardiogram revealed normal systolic function.  Ejection fraction was  estimated to be 55-65%.  No cardiac source of embolism was identified.  EKG  showed a normal sinus rhythm with nonspecific ST and T-wave changes.   RADIOLOGIC STUDIES PERFORMED:  Chest x-ray which showed no acute  cardiopulmonary disorder.  CT scan of the head was negative.  MRI scan of  the brain revealed a 1 cm left pontine paramedian stroke, strongly positive  on diffusion-weighted images for an area of acute infarction.  Scattered  changes of small vessel disease were seen throughout the periventricular and  subcortical white matter, probably due to patient's chronic hypertension.  On admission, the patient was placed on aspirin.  Norvasc, Altace, and Lasix  were continued for blood pressure regulation.  Percocet was administered for  relief of painful headache.  Plavix was subsequently added to the patient's  regimen.  A speech therapy consult was requested as well as a physical  medicine and rehabilitation consult.  It was felt the patient would be a  good candidate for physical medicine and rehabilitation evaluation and  treatment and therefore on August 20, the patient was transferred to the  physical medicine rehabilitation department at [facility].   DIAGNOSES AT THE TIME OF DISCHARGE:  1. 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  2. Poorly controlled hypertension.  3. History of chronic obstructive pulmonary disease.   MEDICATIONS AT THE TIME OF TRANSFER:  1. Norvasc 5 mg daily.  2. Altace 5 mg daily.  3. Lasix 40 mg daily.  4. Tylenol p.r.n. for headache.  5. Senokot-S p.r.n. for constipation.  6. Tylox p.r.n. for severe headache.   CONDITION AT THE TIME OF DISCHARGE:  Fair.                                                Sherin Quarry, MD    SY/MEDQ  D:  01/18/2003  T:  01/19/2003  Job:  [id number]   cc:   Holley Bouche, M.D.  [address]  [place], [place] [address]  Fax: [phone]

## 2010-10-06 NOTE — Op Note (Signed)
[name], [name]              ACCOUNT NO.:  [id number]   MEDICAL RECORD NO.:  [id number]          PATIENT TYPE:  AMB   LOCATION:  DSC                          FACILITY:  [facility]   PHYSICIAN:  Cindee Salt, M.D.       DATE OF BIRTH:  [birth year]   DATE OF PROCEDURE:  04/25/2006  DATE OF DISCHARGE:                               OPERATIVE REPORT   PREOPERATIVE DIAGNOSIS:  Locked stenosing tenosynovitis, left thumb.   POSTOPERATIVE DIAGNOSIS:  Locked stenosing tenosynovitis, left thumb.   OPERATION:  Decompression A1 pulley, left thumb.   SURGEON:  Cindee Salt, M.D.   ASSISTANT:  Carolyne Fiscal R.N.   ANESTHESIA:  Forearm based IV regional.   HISTORY:  The patient is a 50 year old female with a history of a locked  left trigger thumb.  She is desirous of having this released.  She is  aware of risks, complications including infection, recurrence, injury to  arteries, nerves, tendons, incomplete relief of symptoms, dystrophy,  bleeding secondary to her Coumadin.  She has been off her Coumadin for 3  days, admitted now for release.  In the preoperative area questions  encouraged and answered.  She was given a stellate ganglion block.  The  extremity marked by both the patient and surgeon.   PROCEDURE:  The patient was brought to the operating room.  A forearm  based IV regional anesthetic carried out without difficulty.  She was  prepped using DuraPrep, supine position, left arm free, draped.  The  transverse incision was made over the A1 pulley of the left thumb,  carried down through subcutaneous tissue.  Bleeders were  electrocauterized with bipolar.  The locked flexor pollicis longus  tendon was immediately apparent with a large __________ node.  The  radial aspect of the A1 pulley was released, the oblique pulley was left  intact.  The finger was then able the fully extended without difficulty.  The wound was irrigated.  The skin closed with interrupted 4-0 chromic  sutures.   Compression was then held on the area of incision, since one  the tourniquet was deflated, pressure was maintained for five minutes.  No bleeding, hematoma was noted.  Following this, compressive dressing  was applied.  The patient tolerated the procedure well was taken to the  recovery for observation in satisfactory condition.  She is discharged  home to return to the [facility] in one week on [place]  [facility].           ______________________________  Cindee Salt, M.D.     GK/MEDQ  D:  04/25/2006  T:  04/25/2006  Job:  [id number]   cc:   Holley Bouche, M.D.

## 2010-10-06 NOTE — Op Note (Signed)
[facility]  Patient:    [name], [name]                     MRN: [id number] Proc. Date: 02/14/00 Adm. Date:  [id number] Attending:  Michaele Offer                           Operative Report  PREOPERATIVE DIAGNOSES:  Pelvic pain.  POSTOPERATIVE DIAGNOSES:  Pelvic pain and pelvic and abdominal adhesions.  PROCEDURE:  Extensive laparoscopic lysis of adhesions.  SURGEON:  Lavina Hamman, M.D.  ANESTHESIA:  General endotracheal tube.  ESTIMATED BLOOD LOSS:  Approximately 100 cc.  FINDINGS:  Omentum was adherent to the anterior abdominal wall in several areas. The small bowel was adherent to the anterior abdominal wall in the midline. The colon was adherent to the vaginal cuff. There were no pelvic masses in the cervical stump otherwise appeared normal.  COUNTS:  Correct.  CONDITION:  Stable.  DESCRIPTION OF PROCEDURE:  After appropriate informed consent was obtained, the patient was taken to the operating room and placed in the dorsal supine position. General anesthesia was induced and she was placed in mobile stirrups. Her abdomen, perineum, and vagina were then prepped and draped in the usual sterile fashion. A Foley catheter was inserted and an acorn cannula inserted into the cervix for uterine manipulation along with a single tooth tenaculum. Her infraumbilical skin was then infiltrated with 0.25% Marcaine and a 3 cm horizontal incision was made. Significant bleeding was noted from the right side of the incision and this could not be controlled with electrocautery. This was controlled with a suture of 3-0 Vicryl. With significant difficulty, due to the thickness of her abdominal wall, the fascia was eventually identified and entered sharply after being elevated. Eventually, a pursestring suture of #0 Vicryl was placed around this incision and a make shift Hasson cannula was inserted. Again this was very difficult due to the thickness  of her anterior abdominal wall. The abdomen was then insufflated with CO2 gas and inspection with the laparoscope revealed the above mentioned findings. A 5 mm port was placed in the right lower quadrant under direct visualization. Omental adhesions in the right upper quadrant were then taken down with the tripolar device using bipolar cautery and sharp dissection. Omental adhesions in the midline were then taken down likewise. The small bowel was adherent vertically in the midline and with a fairly thin adhesions, this was dissected also sharply with the tripolar device careful to avoid injury to the bowel. Eventually, a 5 mm port was placed in the left lower quadrant again under direct vision. Adhesions over the cervical stump were also taken down sharply to free up omentum and large intestine. The colon was also adherent to the left pelvic side wall and these were taken down sharply. Omental adhesions just beneath the umbilicus and to the left were then also taken down sharply with the tripolar device. Dissection required approximately 3 hours. At the end of the procedure, there were no significant adhesions left and this was felt to be the source of her pain. All sites were inspected and found to be hemostatic. The 5 mm ports were removed under direct vision followed by all gas allowed to deflate from the abdomen. The infraumbilical trocar was removed and gas allowed to deflate from the abdomen. The fascia was reapproximated with a previously placed pursestring suture of #0 Vicryl. There was still a fascial  defect and this was reapproximated with another suture of #0 Vicryl. The deep subcutaneous tissue was then reapproximated with a running suture of 3-0 Vicryl. All skin incisions were closed with subcuticular sutures of 4-0 Vicryl followed by Steri-Strips and band-aids. All instruments were removed from the vagina and the Foley catheter was removed. The patient was extubated in the  operating room and tolerated the procedure well and was taken to the recovery room in stable condition. DD:  02/14/00 TD:  02/15/00 Job: [id number]

## 2010-10-06 NOTE — Op Note (Signed)
[name], [name]              ACCOUNT NO.:  [id number]   MEDICAL RECORD NO.:  [id number]          PATIENT TYPE:  OBV   LOCATION:  0444                         [facility]ACILITY:  [facility]   PHYSICIAN:  Marlowe Kays, M.D.  DATE OF BIRTH:  11-Feb-1961   DATE OF PROCEDURE:  08/09/2004  DATE OF DISCHARGE:                                 OPERATIVE REPORT   PREOPERATIVE DIAGNOSIS:  Ulnar neuropathy, right elbow.   POSTOPERATIVE DIAGNOSIS:  Ulnar neuropathy, right elbow, secondary to local  compression.   OPERATION:  Decompression, ulnar nerve, right elbow.   SURGEON:  Marlowe Kays, M.D.   ASSISTANT:  Nurse.   ANESTHESIA:  General.   PATHOLOGY AND JUSTIFICATION FOR PROCEDURE:  She was having pain and numbness  in the ulnar nerve digits of the right hand.  She had had a carpal tunnel  release many years ago.  Nerve conduction studies demonstrated ulnar  neuropathy at the elbow.  Consequently, she is here for either decompression  or anterior transposition if the ulnar nerve is unstable in the groove.   PROCEDURE:  Satisfactory general anesthesia.  She was cleared for surgery  preoperatively.  A Foley catheter inserted at her request.  Pneumatic  tourniquet with arm esmarched out nonsterilely and prepped from wrist to  tourniquet with DuraPrep and draped in a sterile field.  I made a curved  incision basically following along the ulnar nerve and went down through the  fascia just proximal to the medial epicondyle.  I located the nerve, which  had a little grayish appearance in general.  It was not compressed at this  location, but I freed it up into the distal arm proximal to the elbow.  I  then gently dissected it ou distally and just as it entered the flexor  ulnaris, found that it was tightly compressed there and distally to it, the  nerve was thinned out with an area of indentation in the nerve and, in  retrospect, after the nerve was all freed up, it had a bulbous appearance  proximal to this indentation.  I then flexed and extended the elbow.  The  nerve stayed in the groove so it was not unstable.  I made sure that the  decompression was thoroughly completed and irrigated the wound well.  Bleeders were coagulated throughout the case with regular and bipolar  cautery.  I then closed the subcutaneous tissue deep with 2-0 Vicryl,  superficially with 3-0 Vicryl and the skin with interrupted 4-0 nylon  mattress sutures.  Betadine, Adaptic, dry sterile dressing applied with a  padded dressing.  She was placed in a sling and was on her way to the  recovery room in satisfactory condition at the time of this dictation with  no known complications.      JA/MEDQ  D:  08/09/2004  T:  08/09/2004  Job:  [id number]

## 2010-10-06 NOTE — H&P (Signed)
[name], KUBICKI              ACCOUNT NO.:  [id number]   MEDICAL RECORD NO.:  [id number]            PATIENT TYPE:   LOCATION:                                 FACILITY:   PHYSICIAN:  Donalee Citrin, M.D.             DATE OF BIRTH:   DATE OF ADMISSION:  DATE OF DISCHARGE:                                HISTORY & PHYSICAL   ADMITTING DIAGNOSIS:  Cervical spondylotic radiculopathy.   SECONDARY DIAGNOSIS:  Status post cerebrovascular accident with  hypercoagulable state.   HISTORY OF PRESENT ILLNESS:  Patient is a 50 year old female with a history  of asthma, stroke, PE, with hypercoagulable state who is on chronic Coumadin  treatment who presented to my office with a cervical radiculopathy and  patient has been admitted for discontinuation of her Coumadin and  heparinization preoperatively.  Patient complains predominately of pain that  radiated down her arm into the thumb and forefinger consistent with a C6  nerve root.  The patient had some preop weakness of her triceps and failed  __________ treatment.  So, she is brought in the hospital for the  aforementioned procedure.   PAST MEDICAL HISTORY:  As above.  CVA, asthma, PE.   ALLERGIES INCLUDE:  DARVOCET, ASPIRIN, PREDNISONE, AND LATEX.   MEDICATIONS ON ADMISSION:  Robaxin, albuterol, Lyrica, and Elavil.   PHYSICAL EXAMINATION:  GENERAL:  The patient is very pleasant, awake and  alert female in no acute distress.  HEENT:  Within normal limits.  EXTREMITIES:  Upper extremity strength is 5/5 with some trace weakness of  the triceps.  Lower extremity strength is:  She has some evidence of a  hemiparesis secondary to her stroke on the right side, but otherwise the  patient was cardiopulmonary stable.   Imaging showed severe cervical spondylosis C5-6.  Patient has been preop'd  and consented for surgery and she will undergo discontinuation of her  Coumadin and heparinization preop.  Once the INR and PTT normalize, we will  stop  the heparin 4 hours prior to surgery.           ______________________________  Donalee Citrin, M.D.     GC/MEDQ  D:  11/30/2005  T:  11/30/2005  Job:  [id number]

## 2010-10-06 NOTE — Op Note (Signed)
[name], [name]              ACCOUNT NO.:  [id number]   MEDICAL RECORD NO.:  [id number]          PATIENT TYPE:  INP   LOCATION:  3002                         FACILITY:  [facility]   PHYSICIAN:  Donalee Citrin, M.D.        DATE OF BIRTH:  1960-06-02   DATE OF PROCEDURE:  02/05/2005  DATE OF DISCHARGE:                                 OPERATIVE REPORT   PREOPERATIVE DIAGNOSIS:  Left C6 radiculopathy from cervical spondylosis  with spondylitic compression of the C6 nerve root.   POSTOPERATIVE DIAGNOSIS:  Left C6 radiculopathy from cervical spondylosis  with spondylitic compression of the C6 nerve root.   PROCEDURE:  Anterior cervical discectomy and fusion C5-C6 using a 5 mm  Lifenet patellar wedge and a 22 mm Atlantis plate with four 13 mm variable  angle screws.   SURGEON:  Donalee Citrin, M.D.   ASSISTANT:  Kathaleen Maser. Pool, M.D.   ANESTHESIA:  General endotracheal anesthesia.   HISTORY OF PRESENT ILLNESS:  The patient is a very pleasant 50 year old  female who has had long-standing neck and predominantly left arm pain  radiating down to her thumb and forefinger with numbness, tingling, in the  same distribution and some trace weakness in the left triceps.  Preoperative  imaging showed cervical spondylosis with spinal nerve compression of both  the C6 neural foramina.  The patient has failed all forms of conservative  treatment.  The patient was recommended anterior cervical discectomy and  fusion.  The patient had a history of previous CVA and was on Coumadin.  The  has been admitted to the hospital and discontinued on Coumadin and  maintained on heparin.  The heparin was stopped at approximately 6 a.m. on  the day of surgery and INR preoperatively was 1.3 and the patient was taken  back to the OR.   DESCRIPTION OF PROCEDURE:  The patient was induced under general anesthesia,  positioned supine with the neck in slight extension and 5 pounds of halter  traction.  The left side of the neck  was prepped and draped in the usual  sterile fashion.  A curvilinear incision was made after localization of the  C5-C6 space, the superficial tissues and fascia divided longitudinally.  An  avascular plane between the sternocleidomastoid and strap muscles was  developed and the prevertebral fascia was dissected with Kitners.  There was  noted to be a very large anterior osteophytic spur coming off the C5-C6 disc  space.  The longus colli was then reflected laterally.  An interoperative x-  ray confirmed localization of the C4-C5 disc space above this so the spur  and the level below was bitten off.  Annulotomy was achieved with a scalpel  and Kerrison ronguers were used to clean the anterior margin of the disc  space.  A self-retaining retractor was placed.  The large anterior  osteophytes were coming off the C5-C6 interspace and annulotomy was  continued with #15 blade scalpel.  Then, a high speed drill was used to  drill down the disc space and the entire disc space was cleaned out with a  Leksell rongeur.  Then, the operating microscope was brought onto the field  and under microscopic instrumentation, the remaining posterior annulus and  posterior longitudinal ligament is removed in piecemeal fashion dividing the  process of the C5 vertebral body.  This was all done in a piecemeal fashion  decompressing the thecal sac centrally.  Then, the right C6 neural foramina  was identified and this was noted to be widely patent.  Then, attention was  turned to mapping the left, there was a fair amount of inflammatory response  around the ligament.  This was all bitten off in a piecemeal fashion  decompressing the proximal left C6 nerve root.  This was explored with a  nerve hook and the foramina was opened up so it was positively decompressed.  The was scraped were scraped, copiously irrigated, and a 5 mm Lifenet  patellar wedge was inserted 1 mm deep to the anterior vertebral body line.  A 23  mm Atlantis plate was inserted with four 13 mm variable angle screws.  All screws had excellent purchase.  The set screw was tightened down.  The  wound was copiously irrigated and meticulous hemostasis was maintained.  The  platysma was reapproximated with 3-0 interrupted Vicryl and skin was closed  with running 4-0 subcuticular.  Benzoin and Steri-Strips were applied.  The  patient went to the recovery room in stable condition.  At the end of the  case, the needle count and sponge counts were correct.           ______________________________  Donalee Citrin, M.D.     GC/MEDQ  D:  02/05/2005  T:  02/05/2005  Job:  [id number]

## 2010-10-06 NOTE — Consult Note (Signed)
[name], [name]                          ACCOUNT NO.:  [id number]   MEDICAL RECORD NO.:  [id number]                   PATIENT TYPE:  INP   LOCATION:  6706                                 FACILITY:  [facility]   PHYSICIAN:  [name], M.D.               DATE OF BIRTH:  [birth year]   DATE OF CONSULTATION:  06/29/2003  DATE OF DISCHARGE:                                   CONSULTATION   HISTORY OF PRESENT ILLNESS:  [name] is a 50 year old right handed  black female born [birth year] with a history of cerebrovascular disease  with a documented left pontine infarct by MRI scan on January 05, 2003. The  patient apparently sustained a right hemiparesis from that event and has  been on total disability through SSI disability, getting around in a  wheelchair. The patient has been followed by Dr. Clarisse Gouge for ongoing headaches  since the stroke event. The patient comes into the hospital at this point  complaining of left sided pain involving the leg first, then the arm,  beginning a couple of days prior to this admission. The patient denies any  pain on the left face. Does note pain up the left neck to the ear at times.  The patient will have intermittent spells where she cannot use her left arm  off and on, but this is not persistent. The patient has undergone a MRI  angiogram that is unremarkable. The screening studies of the brain done with  this really show no evidence of brain or brain stem ischemia. The patient  has been thoroughly evaluated with chest x-rays and CT of the chest to rule  out dissection or pulmonary emboli. These have been unremarkable. Neurology  is asked to see this patient for further evaluation of the above  symptomatology.   PAST MEDICAL HISTORY:  1. New onset left sided pain as above.  2. Tonsillectomy.  3. Gallbladder resection.  4. Morbid obesity.  5. Hysterectomy in the past.  6. Surgery for adhesions.  7. __________ bunionectomies.  8. Left heel  surgery.  9. Right carpal tunnel syndrome surgery.  10.      Hypertension.  11.      History of asthma.  12.      History of pulmonary emboli.   HABITS:  The patient does not smoke or drink.   ALLERGIES:  Has no known allergies.   CURRENT MEDICATIONS:  1. Topamax 100 mg q.h.s.  2. Norvasc 5 mg q.h.s.  3. Advair one puff b.i.d.  4. Coumadin 7.5 mg q.h.s.  5. Protonix 40 mg a day.  6. Neurontin 300 mg t.i.d.  7. Tylox if needed.  8. Morphine if needed.   SOCIAL HISTORY:  The patient lives in the [place] area. Is on total  disability, SSI disability. The patient has no children, is single, does not  work.   FAMILY HISTORY:  Notable for the  fact that mother is alive with  hypertension. Father is alive with hypertension, diabetes, prostate cancer.  The patient has two brothers, two sisters. One brother died with HIV.   REVIEW OF SYSTEMS:  Notable for no fevers, chills. The patient does note  ongoing daily headaches. Has double vision, blurred vision that is chronic  since the stroke. Denies neck pain per se other than the left sided pain as  described above. Denies shortness of breath. There is no chest pain,  pressure in the chest. Denies abdominal pain. Has had some nausea. Denies  any problems controlling the bowels. Has some urinary incontinence at times.  Denies any blackout episodes.   PHYSICAL EXAMINATION:  VITAL SIGNS:  Blood pressure is 134/67, heart rate  55, respiratory rate 24, temperature afebrile.  GENERAL:  This patient is a markedly obese black female who is alert and  cooperative at time of examination.  HEENT:  Head is atraumatic. Eyes:  Pupils are equal, round, reactive to  light. Disks are flat bilaterally.  NECK:  Supple. No carotid bruits noted.  RESPIRATORY:  Clear.  CARDIOVASCULAR:  Reveals a regular rate and rhythm. No obvious murmurs or  rubs noted.  EXTREMITIES:  Reveal 1 to 2+ edema.  NEUROLOGICAL:  Cranial nerves as above. Facial asymmetry is  present. The  patient has a symmetric smile. Has decreased pinprick sensation on the right  face compared to the left, splits the midline with vibratory sensation on  the forehead. The patient has again full extraocular movements, no  ophthalmoplegia seen. Speech is well enunciated, not aphasic or dysarthric.  Tongue protrudes in the midline. Motor testing reveals clear give away  weakness of the right arm, right leg. When asked to push down with her right  arm, the patient actually resists gravity, keeps the arm up in the air. The  patient has no pronator drift, although she gives prominent weakness as give  away on direct testing. The patient has good symmetric reflexes, normal  strength on the left side. Toes are neutral bilaterally. Pinprick sensation  reveals decreased pinprick sensation on the right arm and right leg as  compared to the left, vibratory sensation mildly depressed on the right arm  and right leg compared to the left. The patient has fair finger-nose-finger  with the left arm, can perform slowly with the right, will not perform toe  to finger with the right leg. Will perform with the left leg. Although  patient does move the right leg much with pulling the knees together or  apart, develops normal strength in the right leg. The patient was not  ambulated.   LABORATORY VALUES:  Notable for INR of 1.9. CK of 68. Antithrombin-III 98.  Anticardiolipin antibodies pending. Sodium 141, potassium 3.2, chloride 107,  glucose 96, BUN 13, creatinine 1.0.   CT scan of the head done this admission shows what appears to be a prior  pontine infarct. Chest x-ray was unremarkable. EKG reveals sinus  bradycardia, nonspecific T-wave abnormalities.   IMPRESSION:  1. History of left pontine infarct.  2. New onset left sided pain syndrome, etiology unclear.  3. History of pulmonary embolus on anticoagulation.  4. Hypertension.  5. Obesity.  This patient has a clear functional  examination today. The patient has give  away weakness on the right side, likely has very minimal deficit from her  prior stroke event. Given this examination, one is suspect of the current  symptoms with left sided pain syndrome. This new symptomatology may  also be  functional in nature. The patient, however, clearly has some underlying  clinical disease. If not already done, the patient will need a full workup  for  hypercoagulable state. I would not pursue any further workup or management  of her left sided pain syndrome. Will leave this up to Dr. Rene Kocher  upon discharge. The aspects of total disability in this patient, however,  should be revisited as I suspect she probably is not totally disabled.                                               [name], M.D.    CKW/MEDQ  D:  07/01/2003  T:  07/02/2003  Job:  [id number]

## 2010-10-06 NOTE — Discharge Summary (Signed)
[name], [name]              ACCOUNT NO.:  [id number]   MEDICAL RECORD NO.:  [id number]          PATIENT TYPE:  INP   LOCATION:  3002                         FACILITY:  [facility]   PHYSICIAN:  Donalee Citrin, M.D.        DATE OF BIRTH:  [birth year]   DATE OF ADMISSION:  02/03/2005  DATE OF DISCHARGE:  02/08/2005                                 DISCHARGE SUMMARY   ADMISSION DIAGNOSIS:  Cervical spondylitic radiculopathy.   SECONDARY DIAGNOSES:  1.  Protein S deficiency.  2.  Status post cerebrovascular accident, on Coumadin.   The patient was admitted for a preoperative heparinization on September 16.  Came to the hospital.  Her Coumadin was stopped, her heparin was initiated.  She was maintained on heparin until four hours prior to surgery, at which  point it was stopped and her PTT normalized.  The patient underwent the  aforementioned procedure of anterior cervical diskectomy and fusion at C5-6.  Postoperatively she was doing very well and kept on the floor.  On the floor  the patient was afebrile with stable vital signs with complete resolution of  preoperative arm pain, very minimal neck stiffness.  She was reinitiated on  Lovenox and subsequently Coumadin and will be discharged on postop day 1.  She was discharged to start her previous dose of Coumadin without any  additional heparin and sent for INR approximately after the fourth dose.  At  the time of discharge the patient is neurologically stable.   DISCHARGE DIAGNOSES:  1.  Cervical spondylitic radiculopathy.  2.  Status post cerebrovascular accident.           ______________________________  Donalee Citrin, M.D.     GC/MEDQ  D:  04/04/2005  T:  04/04/2005  Job:  [id number]

## 2010-10-06 NOTE — Consult Note (Signed)
[name], [name]                          ACCOUNT NO.:  [id number]   MEDICAL RECORD NO.:  [id number]                   PATIENT TYPE:  INP   LOCATION:                                       FACILITY:  [facility]   PHYSICIAN:  Meade Maw, M.D.                 DATE OF BIRTH:  [birth year]   DATE OF CONSULTATION:  DATE OF DISCHARGE:                                   CONSULTATION   INDICATION FOR CONSULTATION:  Ongoing chest pain.   HISTORY OF PRESENT ILLNESS:  [name] is an unfortunate 50 year old  female who was admitted on June 28, 2002 for ongoing atypical left chest  pain.  Her past medical history is significant for right brain stem  cerebrovascular accident and paralysis in September, 2004.  At that time,  she was started on Plavix and aspirin.  She subsequently had a pulmonary  embolus in October 2004, and was subsequently started on Coumadin.  Since  her pulmonary embolus, she has had ongoing chronic chest pain.  The chest  pain is described as a severe pressure sensation in her left chest and  radiates to her neck.  There was some resolution of the chest pain until  this time with admission.  The chest pain is similar in nature to her chest  pain following history of pulmonary embolus and the chest pain is somewhat  reproducible with pressure.  She has had no nausea, vomiting, diaphoresis.   Her coronary risk factors are significant for remote history of tobacco use,  hypertension, diet controlled diabetes, obesity, surgical menopause.   PAST MEDICAL HISTORY:  1. Asthma, environmental.  2. Allergies.  3. Hypertension.  4. Syncope.  5. Cerebrovascular accident, right brain stem as mentioned above.  6. Left lower lobe pulmonary embolus on Coumadin therapy.  7. Severe osteoarthritis.   ADMISSION MEDICATIONS:  1. Neurontin 300 mg 1-2 capsules three times a day.  2. Topamax 100 mg p.o. q.h.s.  3. Norvasc 5 mg daily.  4. Warfarin 5 mg alternating with 7.5 mg  q.h.s.  5. Advair 100/50, one puff b.i.d.  6. Analgesics for headache.   SOCIAL HISTORY:  Remote history of tobacco use.  There is no history of  alcohol or elicit drug use.  The patient currently is residing with her  parents.   PAST SURGICAL HISTORY:  1. Cholecystectomy.  2. Hysterectomy with bilateral salpingo-oophorectomy in July, 1999.  3. History of extensive adhesions requiring cervical hysterectomy.  4. Infection of multiple toenails.  5. Tonsillectomy.   ALLERGIES:  PREDNISONE, ASPIRIN.  However, with the aspirin, she is found to  have esophagitis.   PHYSICAL EXAMINATION:  GENERAL:  Lying comfortably in bed, in no acute  distress.  VITAL SIGNS:  Blood pressure 107/49, respiratory rate 20, heart rate 54.  She is afebrile.  HEENT:  Unremarkable.  NECK:  Good carotid upstrokes.  No neck vein distention noted.  PULMONARY:  Breath sounds equal and clear to auscultation.  No rubs  noted.  CARDIOVASCULAR:  Normal S1, normal S2, regular rate and rhythm with no rubs,  murmurs or gallops noted.  The patient is noted to have reproducible chest  pain in the left precordial region with pressure.  ABDOMEN:  Soft, nontender, normal bowel sounds.  EXTREMITIES:  No clubbing, cyanosis.  NEUROLOGIC:  Weakness with flexion and extension on her left side.  She is  able to sit without assistance.  SKIN:  Warm and dry.   LABORATORY DATA:  Blood gas on admission revealed a pH of 7.34, pCO2-42,  bicarbonate 23.  Hemoglobin 13, INR 1.9, potassium 3.2.  CK-MB was less than  1.0.  Troponin I was less than 0.05.  Myoglobin 79.   CT scan of the chest has revealed interval resolution of the previously seen  pulmonary emboli in the left lower pulmonary artery system.   EKG reveals sinus rhythm with nonspecific ST changes.  Cardiac  catheterization from October, 2003 revealed no evidence of coronary artery  disease.  She was noted to have normal wall motion with an ejection fraction  of 55%.    IMPRESSIONS:  #85.  A 50 year old female with atypical chest pain, minimal  risk factors as noted above, normal heart catheterization in October, 2003.  While it is possible to have a myocardial infarction following a normal  cardiac catheterization, this is secondary to plaque rupture or to coronary  spasm and would be accompanied by  a bump in her cardiac enzymes.  Due to  the chronicity of the chest pain, normal cardiac enzymes and minimal risk  factors, will not pursue with further cardiac workup.  Suspect the chest  pain may be more related to her pulmonary embolus and that this has been  ongoing since her blood clot.  #2.  Hypertension.  Blood pressure is well controlled.  #3.  History of left lower pulmonary embolus and cerebrovascular accident.  Will defer this to her primary service for further evaluation.                                               Meade Maw, M.D.    HP/MEDQ  D:  06/30/2003  T:  07/01/2003  Job:  [id number]   cc:   Holley Bouche, M.D.  [address]  [place], [place] [address]  Fax: [phone]

## 2010-10-06 NOTE — Assessment & Plan Note (Signed)
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 The patient was  status post cerebrovascular accident in 2004 with residual right sided  hemiparesis felt secondary to an embolic event. She apparently had a  transesophageal echocardiogram in March 2005 that showed a patent  foramen ovale with an atrial septal aneurysm. Her left LV function is  normal. She has had problems with chronic chest pain. She had a cardiac  catheterization by Dr.  Meade Maw in 2003. This revealed an ejection  fraction of 55%. There was no obstructive coronary disease noted. She  had a followup Myoview on January 12, 2004. There was no ischemia or  infarction and her ejection fraction was 60%. Her most recent  echocardiogram was performed on January 12, 2004 and her LV function was  low to normal with an ejection fraction of 50% and trace tricuspid  regurgitation. The patient is scheduled for bariatric surgery and we  were asked to evaluate preoperatively.   She occasionally has mild dyspnea on exertion but this does not appear  to be a significant problem. There is no orthopnea or paroxysmal  nocturnal dyspnea. She occasionally has mild pedal edema. She continues  to have chest pain. This has been a chronic problem and is persistent on  most days of the week. She describes it as someone slamming her left  breast in a door. It is not pleuritic, or positional, nor is it related  to food. It can be  associated with nausea. She does not have  palpitations, presyncope, or syncope. Note, she is confined to a  wheelchair for the most part, other than minimal ambulation at times  with a brace on her right lower extremity secondary to her prior  cerebrovascular accident.   Her medications include Coumadin as directed, Norvasc 5 mg p.o. daily,  Duragesic patch, Neurontin 100 mg p.o. t.i.d., biotin, vitamin B6,  stress B with vitamin C, calcium and Zocor.   She has an allergy to ASPIRIN, PREDNISONE, and DARVOCET.   SOCIAL HISTORY:  She does not smoke nor does she consume alcohol.   FAMILY HISTORY:  Is negative for coronary artery disease.   PAST MEDICAL HISTORY:  Is significant for hypertension and  hyperlipidemia, but there is no diabetes mellitus. She has a remote  history of asthma. She has a history of a patent foramen ovale as  described in the history of present illness. She also has fibromyalgia.  She has had a prior cerebrovascular accident with residual right sided  weakness. She has had a prior tonsillectomy, cholecystectomy,  hysterectomy, and surgery for adhesions. She has had a bunionectomy as  well. She has had carpal tunnel surgery. She does have a history of a  pulmonary embolus in 2004.   REVIEW OF SYSTEMS:  She denies any headaches or fevers or chills. There  is not productive cough or hemoptysis. There is no dysphagia or  odynophagia, melena, hematochezia. There is no dysuria or hematuria.  There is no rash or seizure activity. There is on orthopnea or  paroxysmal nocturnal dyspnea, but she does have pedal edema. She has  residual right sided weakness from her previous cerebrovascular  accident. The remaining systems are negative.   Her physical exam today shows a blood pressure of 151/97 and her pulse  is 71. She weighs 292 pounds. She is well-developed and obese. She is in  no acute distress. Her skin is warm and dry. She does not appear to be  depressed. There  is no peripheral clubbing.  HEENT:  Unremarkable with normal eyelids.  NECK:  Supple with normal upstroke bilaterally.  I do not appreciate  bruits. There is in jugular venous distention and no thyromegaly is  noted.  CHEST:  Clear to auscultation, normal expansion.  CARDIOVASCULAR EXAM:  Reveals a  regular rate and rhythm, normal S1 and  S2. There are no murmurs, rubs, or gallops noted.  ABDOMINAL EXAM:  Shows no tenderness to palpation and I can not  appreciate hepatosplenomegaly, although her exam is difficult. There is  no abdominal bruit.  EXTREMITIES:  Show obesity and trace to 1+ edema. Her distal pulses are  difficult to palpate.  NEUROLOGICAL EXAM:  Is significant for right sided weakness from her  previous cerebrovascular accident. Her back is normal.   Her electrocardiogram today shows a sinus rhythm as a rate of 71. The  axis is normal. There are nonspecific ST changes.   DIAGNOSES:  1. Preoperative evaluation prior to bariatric surgery.  2. 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  3. Hypertension.  4. Hyperlipidemia.  5. Fibromyalgia.  6. Chronic Coumadin therapy.  7. History of pulmonary embolus.   PLAN:  [name] presents for preoperative evaluation prior to bariatric  surgery. She had a normal catheterization in 2003 and a followup Myoview  in 2005 showed no ischemia or infarction and a normal ejection fraction.  She continues to have chest pain that does not sound cardiac. Her  electrocardiogram shows nonspecific changes. I do not think we need to  pursue further cardiac evaluation prior to her surgery. Her blood  pressure is mildly elevated today but she states it typically runs in  the normal range. I have asked her track this at home and if it remains  elevated we could increase her Norvasc in the future. She has asked  about whether she should have her patent foramen ovale closed given her history of  cerebrovascular accident. I have explained to her that if she  could potentially come off of Coumadin from her previous pulmonary  embolism standpoint than we could consider that. If she can not come off  of the Coumadin, from the pulmonary embolism standpoint, than I would  not put her at the risk of the  procedure to close the patent foramina valve. We can discuss this after  her bariatric surgery. I will see her back in 1 year.     Madolyn Frieze Jens Som, MD, [facility]  Electronically Signed    BSC/MedQ  DD: 06/10/2006  DT: 06/10/2006  Job #: [id number]   cc:   Chrissie Noa  Cleda Mccreedy, M.D.  Starla Link, RN NP

## 2010-10-06 NOTE — Discharge Summary (Signed)
NAME:  [name], [name]                        ACCOUNT NO.:  [id number]   MEDICAL RECORD NO.:  [id number]                   PATIENT TYPE:  INP   LOCATION:  2023                                 FACILITY:  [facility]   PHYSICIAN:  Lazaro Arms, MD          DATE OF BIRTH:  17-May-1961   DATE OF ADMISSION:  03/02/2002  DATE OF DISCHARGE:  03/06/2002                                 DISCHARGE SUMMARY   PRIMARY CARE PHYSICIAN:  Holley Bouche, M.D.   DISCHARGE DIAGNOSES:  1. Noncardiac chest pain.  2. Hypertension.  3. History of asthma.   PROCEDURES:  1. Cardiac catheterization on March 04, 2002, which was completely normal     coronary arteries.  2. Upper endoscopy on March 05, 2002, which was remarkable for a     completely normal endoscopy.   DIAGNOSTIC PROCEDURES:  1. Abdominal ultrasound which was completely within normal limits on March 06, 2002.  2. Spiral CT of the chest which was completely within normal limits on     March 06, 2002.   HISTORY OF PRESENT ILLNESS:  The patient presented on the evening of March 02, 2002, with chest pain which began approximately three hours prior.  It  was accompanied by some shortness of breath, diaphoresis, and mild nausea.  She rated the pain 10/10.  Her EKG was suspicious for some lateral T-wave  changes and so she was admitted for rule out myocardial infarction.   HOSPITAL COURSE:  The patient ruled out for MI and cardiology consultation  was obtained.  They felt that given the significance of the chest pain and  the subtle T-wave changes, they had recommended and proceeded with a cardiac  catheterization which was within normal limits.  The patient had significant  chest pain throughout the hospitalization and initially required a lot of  morphine for analgesia.  Given her history of esophagitis, a GI consultation  was obtained and they performed an endoscopy, which as mentioned before was  completely within  normal limits.  They also recommended getting an  ultrasound as gallbladder disease could present atypically in this fashion.  That too was within normal limits.  The patient had been preemptively  started on Protonix on the day of admission and that had been continued  throughout the hospitalization.  Her pain did, however, improve somewhat and  she was much more comfortable on p.o. analgesia on the day of discharge.  She was discharged in stable condition on March 06, 2002.  Her blood  pressure was 110/70, pulse 70, and 95% on room air.  Her exam was all within  normal limits.  Her hematocrit was stable at 35.7.  Her BMP was all within  normal limits.  Her liver functions were all within normal limits, except  for a total cholesterol of 213 and an LDL of 137.   DISPOSITION:  The patient will be discharged home today.  ACTIVITY:  She will be able to return to work on Monday.   FOLLOW-UP:  She will make an appointment next week with Holley Bouche,  M.D., if she has continued pain.  This was discussed at length with Dr.  Tiburcio Pea on the phone.    DISCHARGE MEDICATIONS:  1. (New medication) Protonix 40 mg p.o. q.d.  2. Lisinopril 40 mg p.o. q.d.  3. Lasix 20 mg p.o. q.d.  4. Norvasc 10 mg p.o. q.d.  5. Lopressor 25 mg p.o. b.i.d.  6. Advair two puffs b.i.d.  7. Vicodin one to two tablets p.o. q.6h. p.r.n.                                               Lazaro Arms, MD    AMC/MEDQ  D:  03/06/2002  T:  03/08/2002  Job:  [id number]   cc:   Holley Bouche, M.D.  [address]  [place], [place] [address]  Fax: [phone]

## 2010-10-06 NOTE — H&P (Signed)
[facility]  Patient:    [name], [name]                     MRN: [id number] Adm. Date:  [id number] Attending:  Nolene Ebbs Iv              History and Physical  CHIEF COMPLAINT:  Short of breath.  HISTORY OF PRESENT ILLNESS:  This is an 50 year old black female with a history of asthma, who complains of onset two weeks prior to admission of a nonproductive cough, wheezing, shortness ofbreath, and fever.  She was seen _____, and diagnosed with asthmatic bronchitis and begun on a course of Zithromax Z-Pak and an albuterol inhaleras well as Advair 100/50 one inhalation b.i.d.  On April 16, she presented for the first time and immediately had an apparent syncopal episode, falling to the floor.  She struck her thumb and actually caused an avulsion of the left thumb nail.  She was seen in the office thatday to care forthe nail but noted that she was no better from the asthma standpoint.  Because of the potential reaction, her Advair was discontinued, and she was begun on a prednisone taper 40 mg to 10 mg over eight days as well as Vicodin for the pain.  On April 19, she followed up for the thumb and was still no better from an asthma standpoint, and Singulair 10 mg q.d. was added.  An attempt was made to obtain a home albuterol nebulizer treatment, but for insurance reasons she has not yet received that.  On the day ofadmission, she returned to the clinic feeling very tightand teary, not feeling well and still with a cough, still short of breath, and now complaining of right pleuritic chest pain.  Her fever has resolved.  She has been out of work since April 15.  She has also now developed shingles all on the right buttocks.  This has occurred before with prednisone used, andshe states the rash broke out about two days ago.  PAST MEDICAL HISTORY: 1. Asthma.  She has had hospitalizations previouslybut not in several years,    and she has been  doing well on simply p.r.n.albuterol alone. 2. Hypertension requiring triple-drug therapy. 3.Allergies. 4. Current herpes zoster. 5. Past history of cholecystectomy. 6. History of hysterectomy and BSO in July 1999 secondary to chronic pelvic    pain and a history of polycystic ovary syndrome.  She had extensive    adhesions requiring a supracervical hysterectomy.  Dr. Nicholas Lose has been her    gynecologist.  MEDICATIONS:  Norvasc 10 mg one p.o. q.d., Accupril 40 mg q.d., albuterol inhaler two puffs q.i.d. p.r.n., HCTZ 25 mg q.d., estradiol2 mg q.d., Allegra 180 mg q.d., recent course of prednisone, Zithromax, and Singulair as well as Advair, as above.  ALLERGIES:  ASPIRIN causes esophagitis.  ADVIL causes question of syncope. History of shingles on PREDNISONE as above.  FAMILY HISTORY:  Father has prostate cancer and diabetes.  Mother with hypertension.  Maternal grandmother had ovarian cancer.  Paternal grandmother had cervical cancer.  Aunts with breastcancer.  SOCIAL HISTORY:  No tobacco, no alcohol. Works for [employer].  REVIEW OF SYSTEMS:  As per HPI.  PHYSICAL EXAMINATION:  VITAL SIGNS:  Weight 172 pounds, blood pressure 180/116, pulse 96.  Peak flow is _____, repeat 150 after albuterol nebulizer.  O2 saturation is 97-98%.  GENERAL:  Well-developed, overweight, hoarse, but no acute shortness of breath, though obviously wheezing.  HEENT:  Atraumatic, normocephalic.  Pupils equal and reactiveto light.  TMs normal.  Oropharynx normal.  NECK:  Supple, nontender, no adenopathy.  CHEST:  Lungs show diffuse wheezes without rales.  There is poor air movement.  CARDIAC:  Regular rhythm.  No murmur, gallop, or rub.  ABDOMEN:  Soft, nontender.  EXTREMITIES:  No edema.  Normal peripheral pulses.  Status post left thumbnail avulsion.  SKIN:  Positive herpes zoster on the right buttocks, and this appears early with just afew bumps.  LABORATORY DATA:   Pending.  IMPRESSION: 1. Asthma with underlying viral upper respiratory infection and bronchitis,    apparently treated and the exacerbation.  O2 saturationsare normal with  poor air movement.  I feel we may have a component of anxiety. 2. Hypertension, poorly-controlled secondary to above. 3. Recurrent herpes zoster, right buttocks. 4. Recent avulsion of left thumb nail, appears to be healing.  PLAN: 1. Admission with O2 via nasal cannula, IV steroids, albuterol nebulizer and    medicine.  Will followO2 saturations, check chest x-ray as well as FENA    and CBC. 2. Begin Valtrex 500 mg t.i.d. 3. Home blood pressure medicines to be continued and will monitor blood    pressure closely. DD:  09/10/00 TD:  09/11/00 Job: [id number]

## 2010-10-06 NOTE — Cardiovascular Report (Signed)
NAME:  [name], [name]                        ACCOUNT NO.:  [id number]   MEDICAL RECORD NO.:  [id number]                   PATIENT TYPE:  INP   LOCATION:  2023                                 FACILITY:  [facility]   PHYSICIAN:  Meade Maw, M.D.                 DATE OF BIRTH:  1960/08/09   DATE OF PROCEDURE:  03/04/2002  DATE OF DISCHARGE:                              CARDIAC CATHETERIZATION   PROCEDURE PERFORMED:  Cardiac catheterization.   INDICATIONS FOR PROCEDURE:  Ongoing chest pain with evolving T-wave changes.   DESCRIPTION OF PROCEDURE:  After obtaining written informed consent, the  patient was brought to the cardiac catheterization lab in the postabsorptive  state.  Sedation was achieved using IV Versed.  The right groin was prepped  and draped in the usual sterile fashion.  Local anesthesia was achieved  using 1% Xylocaine.  A 6 French hemostasis sheath was placed into the right  femoral artery using a modified Seldinger technique.  Selective coronary  angiography was performed using a JL4, JR4 Judkins catheters.  Multiple  views were obtained.  All catheter exchanges were made over a guide wire.  A  single plane ventriculogram was performed in the RAO position.   FINDINGS:  The aortic pressure was 169/887.  The LV pressure was 166/24.  There was no gradient noted on pullback.   Single plane ventriculogram revealed normal wall motion and estimated  ejection fraction as 55%.  There was no mitral regurgitation noted.   CORONARY ANGIOGRAPHY:  Left main coronary artery:  The left main coronary  bifurcates into the left anterior descending and circumflex vessel.  There  is no significant disease in the left main coronary artery.   Left anterior descending:  The left anterior descending gave rise to a left  D1 and then tapers down to a small apical branch.  There are multiple small  septals as well as a small diagonal.  There is no disease in the left  anterior descending  or its branches.   Circumflex vessel:  The circumflex vessel gives rise to an early large  obtuse marginal with actually bifurcates and then trifurcates and goes on in  as a large AV groove vessel.  There is no disease in the circumflex or its  branches.   Right coronary artery:  The right coronary artery is a large dominant giving  rise to several RV marginals as a moderate size PDA.  There is no disease in  the right coronary artery or its branches.    FINAL IMPRESSION:  1. Normal single plane ventriculogram.  2. Normal coronary angiography.  3. Other etiologies for her chest pain should be considered.  Meade Maw, M.D.    HP/MEDQ  D:  03/04/2002  T:  03/04/2002  Job:  [id number]   cc:   Lazaro Arms, MD  [address] [place], [place] [address]  Fax: [phone]   Holley Bouche, M.D.  [address]  [place], [place] [address]  Fax: [phone]

## 2010-10-06 NOTE — H&P (Signed)
[name], [name]                          ACCOUNT NO.:  [id number]   MEDICAL RECORD NO.:  [id number]                   PATIENT TYPE:  INP   LOCATION:  1825                                 FACILITY:  [facility]   PHYSICIAN:  Hollice Espy, M.D.            DATE OF BIRTH:  [birth year]   DATE OF ADMISSION:  06/29/2003  DATE OF DISCHARGE:                                HISTORY & PHYSICAL   DIAGNOSES:  Atypical chest pain as well as weakness in the left arm and leg.   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 She presents  again with these symptoms. The patient had suffered a cerebrovascular  accident back in September of 2004, which left her with some left sided  weakness. Since that time, she has been on Coumadin but has continued to  have problems with occasional episodes of increased weakness on the left  side. In addition, she has had a long history apparently of atypical chest  pain and has had an extensive workup including endoscopy. According to the  patient, she has had a cardiac catheterization although I cannot find  records of this in the dictated discharge summary. She has also had a  history of a pulmonary embolus approximately 3 months ago. The patient  started having complaints of left sided weakness. She had an MRA/MRI done  yesterday, June 27, 2003, which results were reported to be normal as  ordered by her neurologist. Since that time, she has been complaining of  some sharp left sided chest pain, which has been continuous in nature since  the MRI. She has also noted again, episodes of left sided pain, which she  says feels like it is deep in her arm and her leg. She came in to the  emergency room on the evening of June 28, 2003. Cardiac enzymes were done  which were negative. Her vitals were  all stable. No other further workup was  done. She continues to have the same pain or so. Otherwise, she is having no  other complaints. She denies any belly pain. She denies any headache. She  denies any vision changes. No shortness of breath. No right sided weakness.  She denies hematuria, dysuria, constipation or diarrhea.   PAST MEDICAL HISTORY:  Recent left lower lobe embolus 3 months ago, on  Coumadin therapy. Status post old cerebrovascular accident. Severe  osteoarthritis of bilateral knees, right greater than left. Hypertension.  Asthma. History of atypical chest pain.   MEDICATIONS:  Currently the patient is on Neurontin 300 mg 1 to 2 capsule 3  times a day p.r.n., Topamax 100 mg p.o. q 8, Norvasc 5 mg q.d., Warfarin 5  mg alternating with 7.5 mg q.h.s. every day. Advair 100/50 1 puff  b.i.d. and  p.r.n. analgesics for headaches.   SOCIAL HISTORY:  The patient currently denies any smoking, drinking or drug  use.   ALLERGIES:  ASPIRIN AND PREDNISONE. However, the aspirin is found to cause  just esophagitis, which is not a true allergy and the prednisone causes a  reactivation of latent shingles.   FAMILY HISTORY:  Noncontributory.   PHYSICAL EXAMINATION:  VITAL SIGNS:  On admission, temperature 97.9, blood  pressure 128/67, heart rate 59, respiratory rate 18. O2 sat is 98% on room  air.  GENERAL:  The patient appears to be in mild distress although this may be  more fatigue than anything else and secondary to pain. She is alert and  oriented x3.  HEENT:  Normocephalic, atraumatic. She has no cranial nerve deficits. Her  mucous membranes are slightly dry.  NECK:  Carotid bruits are negative.  HEART:  Regular rate and rhythm. S1 and S2.  LUNGS:  Clear to auscultation bilaterally.  ABDOMEN:  Soft, nontender, nondistended. Positive bowel sounds.  EXTREMITIES:  No clubbing or cyanosis. She has approximately 1+ pitting  edema.  NEUROLOGIC:  She has some weakness,  approximately a 4+ in terms of grips,  flexion and extension on the left side, upper and lower extremities and she  has subjective pain again on the sides.   LABORATORY DATA:  PT of 18.5, INR of 1.9. Sodium 141, potassium 3.2,  chloride 107, bicarb 24, BUN 13, creatinine 1.0, glucose 96. Her CPK and MB  is 80 and less than 1. The troponin I is less than 0.05.   Her recent MRI/MRA, which was done on June 27, 2003, shows mild  irregularity of the distal middle cerebral artery and posterior cerebral  artery branches, otherwise negative MRA. Her MRI shows no significant  carotid or vertebral disease.   IMPRESSION/PLAN:  1. Atypical chest pain. Given the patient's extensive workup and reported     cardiac catheterization, I do not think that she is having any cardiac     event at this time. Will still go ahead and put her in a telemetry bed     for now until we can obtain records of her cardiac catheterization. If it     is indeed negative from 1 year ago, then feel that this atypical chest     pain may be again a presentation of perhaps her asthma that she has a     history of. Otherwise, she had endoscopy done approximately 1 year ago     and that was normal as well and this may be nothing. Additionally, will     check a spiral CT to rule out  a pulmonary embolus. If it is positive,     could be concerning as the patient is already on Coumadin and within near     therapeutic levels and may end up needing a deep vein thrombosis filter,     but will check a spiral CT first.  2. Left sided weakness. Her MRI/MRA is negative and as per neurologist, from     her previous stroke, will not do any further workup. In terms of her deep     pain, this may be neuropathy and she is on Gabapentin 300 to 600 t.i.d.     p.r.n. This may need to be changed to chronic doses secondary to her    pain. However, would just simply just put this as a recommendation in her     discharge summary and let her  neurologist or Dr. Tiburcio Pea, her primary care     physician, make this recommendation.  3. In terms of the patient's hypertension, continue her Norvasc.  4. In terms of the patient's asthma, continue her Advair inhaler. Will     basically let her CT of her chest dictate her course. If it is negative,     then can attribute her atypical chest pain to possibly her asthma and her     left sided weakness and deep pain secondary to neuropathy and these are     chronic problems, not able to be treated in the acute setting.                                                Hollice Espy, M.D.    SKK/MEDQ  D:  06/29/2003  T:  06/29/2003  Job:  [id number]   cc:   Holley Bouche, M.D.  [address]  [place], [place] [address]  Fax: [phone]

## 2010-10-06 NOTE — Discharge Summary (Signed)
[name], [name]                           ACCOUNT NO.:  [id number]   MEDICAL RECORD NO.:  [id number]                   PATIENT TYPE:  IPS   LOCATION:  4005                                 FACILITY:  [facility]   PHYSICIAN:  Ranelle Oyster, M.D.             DATE OF BIRTH:  11-Dec-1960   DATE OF ADMISSION:  01/08/2003  DATE OF DISCHARGE:  01/22/2003                                 DISCHARGE SUMMARY   DISCHARGE DIAGNOSES:  1. Right brainstem infarct.  2. Hypertension.  3. Right ankle pain.  4. Headaches.  5. Spasticity.   HISTORY OF PRESENT ILLNESS:  [name] is a 50 year old female with a  history of hypertension, asthma, admitted to [facility] January 05, 2003, with complaints of slurred speech and right-sided weakness.  Blood  pressure noted to be elevated 190/103 at admission.  CCT done was negative  for acute changes.  MRI done today was positive for brainstem infarct.  Carotid duplex showed no ICA stenosis.  She is noted to have some mild right  upper extremity weakness with speech that is improving.  Right upper  extremity remains paretic.  Physical therapy initiated, and the patient at  min assist for transfers, min assist ambulating 15-25 feet with cuing.  Need  to incorporate right upper quadrant, right lower quadrant for functional  activity.   PAST MEDICAL HISTORY:  Significant for:  1. Hypertension.  2. Headaches.  3. Constipation.  4. Insomnia.  5. Cholecystectomy.  6. Recent bronchitis.  7. Hysterectomy.  8. Hand surgery.  9. Multiple foot surgeries requiring left heel cord lengthening as well as     rebuilding.  10.      History of pelvic pain, status post lysis of adhesions.  11.      Asthma.  12.      Atypical chest pain.  13.      Zoster right buttock.  14.      Excision of cervical tumors.  15.      Obesity.   ALLERGIES:  ASPIRIN and PREDNISONE.   SOCIAL HISTORY:  The patient lives with sisters in an apartment with 15  steps at entry.   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  She does not use any tobacco or alcohol.   HOSPITAL COURSE:  [name] was admitted to rehabilitation on January 08, 2003, for inpatient therapies to consist of PT, OT daily.  Past  admission she was maintained on enteric-coated aspirin and Plavix for CVA  prophylaxis.  Subcutaneous Lovenox was added for DVT prophylaxis.  Speech  therapy evaluation was initiated, and the patient showed no signs of  aspiration with thin liquids or solid.  She was noted to have mild  dysarthria, and speech has been following the patient along for this.  Anticoagulation studies were done showing ESR at 13, protein S at 78,  protein C at 155.  Homocystine level at 12.54.  Lipid panel showed  cholesterol at 206, triglycerides 145, HDL 60, VLDL 29, LDL 117.  Check of  CBC showed hemoglobin 12.7, hematocrit 37.4, white count 5.9, platelets 193.  Check of electrolytes showed sodium 140, potassium 4.1, chloride 105, CO2  29, BUN 13, creatinine 0.8, glucose 93.  LFTs were within normal limits.  The patient's blood pressures were monitored on a b.i.d. basis and have  shown good control.  She has had some problems with spasticity right upper  extremity greater than her right lower extremity.  She was started on  Flexeril which were scheduled specially to assist with spasticity at  bedtime.  Baclofen p.r.n. was added to help augment this.  By the time of  discharge spasticity was under good control, and the patient was not using  any baclofen.  She has had problems with right ankle pain with question of  OA versus gout versus occult fracture.  X-rays of ankle done showed lateral  soft-tissue swelling, no evidence of acute fracture or dislocation.  A right  ankle AFO was ordered for support as well as Vioxx scheduled to help prevent  arthritic pain.  The patient's family has had much confusion with the use of  Vioxx, and this was changed  over to Celebrex at discharge.  Trial of  colchicine was attempted.  However, the patient was not in favor of this;  therefore, this was discontinued.  The patient's right hemiparesis did  continue at the time of discharge.  The right upper extremity showed some  improving strength.  Right lower extremity showed some strength at hip  flexures.  At the time of discharge the patient was modified independent for  ADL needs, modified independent for toileting, required p.r.n. assist for  ADL needs.  She continued to move very slowly but was able to complete ADLs  at modified independent level.  She is noted to have some significant  trigger pointing right trapezius and scapular region, therefore limiting her  activity.  The patient was independent for use of right upper extremity  initiation.  In terms of mobility, the patient was at supervision for  transfers, supervision for ambulating 60 feet with a rolling walker,  required min assist to navigate stairs.  She will continue to receive follow-  up home health, follow-up outpatient PT/OT at [facility] beginning January 27, 2003, at 3 p.m.  Also, AFO was  ordered for left ankle to help with ambulation.  On January 22, 2003, the  patient is discharged to home.   DISCHARGE MEDICATIONS:  1. Coated aspirin 81 mg a day.  2. Plavix 75 mg a day.  3. Altace 5 mg a day.  4. Norvasc 5 mg a day.  5. Lasix 40 mg a day.  6. Pepcid 20 mg a day.  7. K-Dur 20 mEq a day.  8. Advair 100/50, 1 puff b.i.d.  9. Celebrex 20 mg a day.  10.      Senokot-S 1 q.h.s.  11.      Ultram 50 mg q.i.d.  12.      Flexeril 5 mg three times daily p.r.n. spasm.   ACTIVITY:  Assistance as needed.  Use a wheelchair for now.  Wear brace  right foot for ambulation.   DIET:  Balanced low-fat, low-cholesterol.   SPECIAL INSTRUCTIONS:  No alcohol, no smoking, no driving.  Outpatient PT/OT at [facility] beginning  January 27, 2003, at 3  p.m.    FOLLOW-UP:  Patient to follow up with Dr. Tiburcio Pea for appointment in two  weeks.  Follow up with Dr. Riley Kill February 22, 2003, at 10:30 a.m.      Greg Cutter, P.A.                    Ranelle Oyster, M.D.    PP/MEDQ  D:  02/09/2003  T:  02/10/2003  Job:  [id number]   cc:   Holley Bouche, M.D.  [address]  [place], [place] [address]  Fax: [phone]

## 2010-10-06 NOTE — Consult Note (Signed)
NAME:  [name], [name]                        ACCOUNT NO.:  [id number]   MEDICAL RECORD NO.:  [id number]                   PATIENT TYPE:  INP   LOCATION:  2023                                 FACILITY:  [facility]   PHYSICIAN:  Meade Maw, M.D.                 DATE OF BIRTH:  09-28-1960   DATE OF CONSULTATION:  DATE OF DISCHARGE:                                   CONSULTATION   REASON FOR CONSULTATION:  Chest pain.   HISTORY:  The patient is a 50 year old  female who presented to the  emergency room with ongoing chest pain of more than four days in duration.  The chest pain initially started on Friday while at work and sitting.  The  pain felt as s squeezing sensation in her chest as if someone was squeezing  her heart.  There was mild nausea and questionable shortness of breath, and  diaphoresis.  The pain waxed and waned throughout the weekend, but was  constantly present.   Her coronary risk factors are significant for  hypertension, diet-controlled  dyslipidemia, obesity, surgical menopause, and remote history of tobacco  abuse.  She smoked for 10 years that has been stopped for the past 10 years.   Serial cardiac enzymes have been obtained and are negative.  Her CK is 104  with an MB of 1.3.  Her troponin I is less than 0.01.  She has had serial T  wave changes noted on her EKG.  Her EKG on admission reveals a sinus rhythm,  there are nonspecific ST changes noted in 1 and aVL with T wave inversion  noted in V3 through V6.  ECG today reveals T wave changes in leads 1 and  aVL.  The chest pain was relieved with morphine 8 mg IV.  Minimal relief was  obtained with intravenous nitroglycerin.   PAST MEDICAL HISTORY:  Significant for:  1. Asthma.  2. Environmental allergies.  3. Hypertension.  4. Herpes zoster affecting the right buttock.  5. Syncope.   PAST SURGICAL HISTORY:  Significant for:  1. Cholecystectomy.  2. Hysterectomy with bilateral salpingo-oophorectomy in July  1999 secondary     to chronic pelvic pain.  3. History of extensive adhesions requiring supracervical hysterectomy.  4. Excision of multiple toenails.  5. Hand surgery.  6. Tonsillectomy.   ALLERGIES:  PREDNISONE and ASPIRIN.   CURRENT MEDICATIONS:  Include:  1. Accupril 40 mg daily.  2. Plavix 75 mg daily.  3. Lasix 20 mg p.o. b.i.d.  4. Norvasc 10 mg daily, which is on hold.  5. Lopressor 25 mg b.i.d.  6. Advair in the A.M. and P.M.  7. Sublingual nitroglycerin p.r.n.  8. Tylenol p.r.n.  9. Morphine sulfate p.r.n.  10.      Lovenox 120 mg subcu q12.  11.      Aspirin 325 mg daily.   REVIEW OF SYSTEMS:  She denies change in  bowel or bladder habits.  She  states that she walks three days per week, approximately 30 minutes per day.  She has no change in vision.  She has had no palpitations or  tachyarrhythmia.  She has had one episode of syncope in the past.  This  followed the first administration of Advair.   FAMILY HISTORY:  Parents are alive and well.  No family history of coronary  artery disease.  Father has possible prostate cancer and diabetes.  Mother  has  hypertension.  Maternal grandmother has ovarian cancer.  Paternal  grandmother had cervical cancer.   SOCIAL HISTORY:  She has never been married.  No history of alcohol use.  Remote history of tobacco use 10 years prior.  Works in Clinical biochemist.  Has no children.   PHYSICAL EXAMINATION:   GENERAL:  Physical exam reveals a morbidly obese female who has recently  received morphine sulfate and appears slightly groggy.  She currently states  she is without pain.   VITAL SIGNS:  Her blood pressure is 140/80, heart rate is 60, her  respiratory rate is 20 and O2 sat is 96% on room air.   HEENT:  Unremarkable.   NECK:  She has good carotid upstrokes.  No carotid bruits.  No neck vein  distention.  Thyroid is not palpable.   PULMONARY EXAMINATION:  Reveals breath sounds, which are equal and clear to   auscultation.  No use of accessory muscles.   CARDIOVASCULAR:  Exam reveals a regular rate and rhythm.  No rubs, murmurs  or gallops are noted.   ABDOMEN:  Morbidly obese.  No unusual bruits or pulsations are noted.   EXTREMITIES:  Reveal no peripheral edema.  Distal pulses are equal and  palpable.   NEUROLOGIC EXAMINATION:  Nonfocal.   SKIN:  Warm and dry.   LABORATORY DATA:  The pH is 7.39, pCO2 is 46, pO2 is 30.  White count is  8.5, hemoglobin is 13.1, hematocrit is 39 and platelet count is 226,000.  Her potassium is slightly low at 3.2, creatinine is 0.8.  Serial cardiac  enzymes were as noted above.  Chest x-ray revealed no acute disease.   IMPRESSION:  1. A 50 year old  female with multiple risk factors for coronary artery     disease as noted above.  The chest pain has been ongoing with normal     cardiac enzymes; however, the patient is noted to have evolving T wave     changes.  This may be secondary to the hypokalemia.  In view of her     ongoing chest pain, however, risks, benefits and options were discussed     with the patient.  After considering her options she wishes to proceed     with coronary angiography.  This was then scheduled.   1. Hypertension; blood pressure currently is moderately controlled.  2. Diet-controlled dyslipidemia; recommend cholesterol profile if this has     not recently been performed.   Recommend ongoing Lovenox, IV nitroglycerin as well as aspirin.                                                  Meade Maw, M.D.    HP/MEDQ  D:  03/03/2002  T:  03/04/2002  Job:  [id number]   cc:   Holley Bouche, M.D.  [address]  [place], [place] [address]  Fax: [phone]   Lazaro Arms, MD  [address] [place], [place] [address]  Fax: [phone]

## 2010-10-06 NOTE — Discharge Summary (Signed)
[name], [name]                          ACCOUNT NO.:  [id number]   MEDICAL RECORD NO.:  [id number]                   PATIENT TYPE:  INP   LOCATION:  6706                                 FACILITY:  [facility]   PHYSICIAN:  Kela Millin, M.D.             DATE OF BIRTH:  [birth year]   DATE OF ADMISSION:  06/28/2003  DATE OF DISCHARGE:  07/02/2003                                 DISCHARGE SUMMARY   DISCHARGE DIAGNOSES:  1. Chest pain, likely secondary to gastroesophageal reflux disease,     myocardial infarction ruled out.  2. Hypokalemia, resolved.  3. Left arm and leg pain, likely secondary to cervical and lumbar spine     degenerative changes as noted on previous imaging studies.  4. Hypertension.  5. Asthma.  6. History of pulmonary embolus.  7. History of cerebrovascular accident.   PROCEDURES:  1. CT angiogram of chest:  Negative for pulmonary embolus.  2. CT scan of head:  Prior pontine infarct noted, no acute intracranial     findings.  3. Lower extremity ultrasound Dopplers:  Negative for DVT.   CONSULTANTS:  1. Cardiology, Meade Maw, M.D.  2. Neurology, [name], M.D.   HISTORY:  The patient is a 50 year old female with history of CVA and right-  sided hemiparesis and atypical chest pain, who presented with similar  symptoms.  The patient reported she had suffered a CVA in September 2004,  which left her with right-sided weakness.  Since that time she had been on  Coumadin but continued to have problems with weakness on that right side.  In addition, she had a history of atypical chest pain and had had an  extensive workup including endoscopy.  According to the patient, she had a  cardiac catheterization about two years ago and it was negative.  The  patient also had a history of pulmonary embolus about three months prior to  her admission.  She then reports that since that time she had had problems  with left side pain and weakness.  She had an MRI  and MRA done on day prior  to admission, and it did not show any acute changes.  She continued to have  the left arm and leg pain as well as the chest pain, and so she came to the  ER for further evaluation and management.  Cardiac enzymes were done in the  ER, and those were negative.   On initial exam it was noted that the patient appeared to be in mild  distress, though it was thought that it might have been secondary to fatigue  and pain.  Her neurologic exam showed some weakness approximately 4+ in  terms of her grip, flexion and extension on the left upper and lower  extremities.  She had approximately 1+ pitting edema.  The rest of her exam  was noted to be within normal limits.   Her lab  work showed a PT of 18.5, INR of 1.9, sodium of 141, potassium 3.2,  chloride 107, bicarb of 24, BUN 13, creatinine 1.0, glucose 96.  Cardiac  enzymes:  The CK-MB was less than 80, and the troponin was less than 0.05.  All the subsequent cardiac enzymes were negative.   HOSPITAL COURSE:  Problem 1.  CHEST PAIN:  Myocardial infarction was ruled out by cardiac  enzymes, and cardiology was consulted and Dr. Fraser Din confirmed the patient  had a cardiac catheterization done in 2003 and it was normal, and she did  not recommend any further cardiac workup.  The chest pain was thought to be  secondary to esophagitis/gastroesophageal reflux disease, and the patient  was treated with a proton pump inhibitor.   Problem 2.  HYPOKALEMIA:  Her potassium was replaced during her hospital  stay.   Problem 3.  LEFT ARM AND LEG PAIN:  This was thought to be secondary to  cervical and lumbar spine degenerative changes noted on previous imaging  studies.  Neurology was consulted for further evaluation and  recommendations, and Dr. Anne Hahn stated that he will not pursue any further  workup of the left-sided pain syndrome.  The pain was thus managed  symptomatically during her hospital stay.  As stated above, the  patient had  had an MRI/MRA just prior to admission and also a CT scan of the brain had  been done on admission, and the results are as stated above; no acute  changes.  The patient was to follow up with Dr. Clarisse Gouge, the outpatient  neurologist.   Problem 4.  HYPERTENSION:  Controlled.  The patient was maintained on  Norvasc during her hospital stay.   Problem 5.  ASTHMA:  The patient was maintained on her Advair during her  hospital stay.   Problem 6.  HISTORY OF PULMONARY EMBOLUS:  The patient was maintained no  Coumadin during her hospital stay, and her INR was therapeutic.   Problem 7.  HISTORY OF CEREBROVASCULAR ACCIDENT:  As discussed above.   CONDITION ON DISCHARGE:  Good.   DISCHARGE MEDICATIONS:  1. Patient to continue all preadmission medications.  2. Prevacid 30 mg one p.o. daily.   DISCHARGE DIET:  Low-cholesterol, low-fat diet.   FOLLOW-UP CARE:  1. Holley Bouche, M.D., in one to two weeks.  2. Dr. Fraser Din in three weeks or as scheduled.  3. Rene Kocher, M.D., neurologist, as scheduled.                                                Kela Millin, M.D.    ACV/MEDQ  D:  07/22/2003  T:  07/23/2003  Job:  [id number]   cc:   Juluis Mire, M.D.  [address]  [place]  [place] [address]  Fax: [phone]   Meade Maw, M.D.  [address]  [place]  [place] [address]  Fax: [phone]   Rene Kocher, M.D.  [address]  [place]  [place] [address]  Fax: [phone]

## 2010-10-06 NOTE — H&P (Signed)
[facility]  Patient:    [name], [name]               MRN: [id number] Attending:  Zenaida Niece, M.D.                         History and Physical  CHIEF COMPLAINT:  Pelvic pain.  HISTORY OF PRESENT ILLNESS:  This is a 50 year old black female, gravida 3, para 0-0-3-0, who was referred to me early in September from [facility] for pelvic pain.  The patient describes approximately a one-year history of constant pelvic ache with sharp, stabbing exacerbations three to four times a day.  These exacerbations are not related to bowel movements or urination, which are normal.  She can identify no specific aggravating or relieving factors and is not currently sexually active.  The pain is not currently affecting her work or personal life.  She does take Tylenol with minimal relief and has tried ibuprofen and Aleve without relief.  She does take occasional Darvocet for the pain.  On physical exam, she has a benign abdomen with slight suprapubic tenderness.  On pelvic exam, she is slightly tender in the midline and to the right and has no pelvic masses.  Options were discussed with the patient, including medical and surgical therapy, and she desires to proceed with laparoscopic surgery.  PAST OBSTETRICAL HISTORY:  Significant for spontaneous abortion x 3.  PAST MEDICAL HISTORY:  Significant for hypertension, asthma, and allergies.  PAST SURGICAL HISTORY:  Significant for foot surgery, cholecystectomy, hand surgery, tonsillectomy, heel surgery, myomectomy followed by supracervical hysterectomy with bilateral salpingo-oophorectomy for pain and fibroids.  ALLERGIES:  PREDNISONE and ASPIRIN.  MEDICATIONS:  Lasix, hydrochlorothiazide, Norvasc 20 mg a day, Accupril 40 mg a day, estradiol 1.25 mg a day.  GYNECOLOGIC HISTORY:  Significant for a history of fibroids and no abnormal Pap smears.  FAMILY HISTORY:  Two paternal aunts with breast  cancer.  Paternal grandmother with uterine cancer.  Maternal grandmother with ovarian cancer.  REVIEW OF SYSTEMS:  She complains of some excess hair growth and a possible history of polycystic ovaries.  PHYSICAL EXAMINATION:  VITAL SIGNS:  Weight is 267 pounds, blood pressure was 152/102.  GENERAL:  She is a well-developed, well-nourished black female, who is slightly obese and in no acute distress.  HEENT:  Pupils equally round and reactive to light and accommodation, and her extraocular muscles are intact.  Oropharynx is clear without erythema or exudate.  NECK:  Supple without lymphadenopathy or thyromegaly.  LUNGS:  Clear to auscultation.  HEART:  Regular rate and rhythm without murmurs.  ABDOMEN:  Soft and nondistended with slight tenderness in the suprapubic area, without palpable masses.  She has low transverse and right upper quadrant scars.  EXTREMITIES:  Nontender, no edema, and DTRs are 2/4 and symmetric.  PELVIC:  Pelvic exam externally reveals no lesions.  On speculum exam, there are no lesions.  She has a normal-appearing cervix, and wet prep was normal. On bimanual exam, she has a mild to moderate cervical motion tenderness, and she is tender in the midline and to the right without palpable masses.  ASSESSMENT:  Pelvic pain, possibly due to adhesions from her previous surgery. She also possibly had a urinary tract infection in the office, which was treated with Septra.  We discussed medications, trying pain medications such as tricyclic antidepressants or Neurontin versus a laparoscopic surgery with possible adhesiolysis.  The patient initially elected  to try Elavil but has since decided to proceed with surgery.  Risks of surgery have been discussed with the patient, including bleeding, infection, and damage to surrounding organs.  We have also discussed the fact that if I look in through the laparoscope and there are significant adhesions, she would elect to  proceed with a laparotomy to remove the adhesions and also possibly remove the cervix at the same time.  PLAN:  Admit the patient for an open diagnostic/operative laparoscopy.  There is the possibility of a laparotomy with lysis of adhesions and removal of her cervix. DD:  02/13/00 TD:  02/13/00 Job: [id number]

## 2010-10-06 NOTE — Consult Note (Signed)
NAME:  [name], [name]                        ACCOUNT NO.:  [id number]   MEDICAL RECORD NO.:  [id number]                   PATIENT TYPE:  INP   LOCATION:  2023                                 FACILITY:  [facility]   PHYSICIAN:  Althea Grimmer. Luther Parody, M.D.            DATE OF BIRTH:  July 08, 1960   DATE OF CONSULTATION:  03/05/2002  DATE OF DISCHARGE:                                   CONSULTATION   REASON FOR CONSULTATION:  The patient is a 50 year old female whom I am  asked to see for atypical chest pain associated with nausea.  The nausea is  intermittent but the chest pain has been fairly constant for the last five  days.  She has mild exertional dyspnea possibly related to her asthma, and  she also says that this seems to aggravate the chest discomfort, which is  somewhat relieved by rest and by nitroglycerin.  The discomfort did go down  her left arm and is somewhat located under her left breast.  She is status  post a cholecystectomy in the past.  She denies dysphagia or clinical  heartburn.  She has not been vomiting.  She has no diarrhea or melena.  She  reports to me that several years ago she had both upper and lower  endoscopies which were negative.  She underwent cardiac catheterization that  demonstrated clean coronaries and we are asked to see if there is any GI  origin of her atypical chest pain.   PAST MEDICAL HISTORY:  Past medical history is pertinent for asthma,  obesity, hypercholesterolemia, hypertension, zoster of her right buttock and  syncope.   PAST SURGICAL HISTORY:  Past surgical history for cholecystectomy and  hysterectomy.  It is noted that she had extensive abdominal adhesions  requiring supracervical hysterectomy.   FAMILY HISTORY:  Noncontributory.   SOCIAL HISTORY:  Former smoker, nondrinker.   CURRENT MEDICATIONS:  1. Metoprolol 20 mg b.i.d.  2. Lisinopril 40 mg every day.  3. Plavix 75 mg every day.  4. Lasix 20 mg b.i.d.  5. Norvasc 10 mg every  day.  6. Advair b.i.d.  7. Nitroglycerin p.r.n.  8. Protonix 40 mg every day.   ALLERGIES:  PREDNISONE and ASPIRIN.   REVIEW OF SYSTEMS:  GENERAL:  No weight loss or night sweats.  ENDOCRINE:  No history of diabetes or thyroid problems.  SKIN:  No rash or pruritus.  EYES:  No icterus or change in vision.  ENT:  No aphthous ulcers or chronic  sore throat.  RESPIRATORY:  Positive for asthma, not currently very active.  CARDIAC:  As above.  GI:  As above.  GU:  No dysuria or hematuria.  Remainder of review of systems is negative.   PHYSICAL EXAMINATION:  GENERAL:  On physical exam, she is an obese adult  female in no acute distress, afebrile.  VITAL SIGNS:  Blood pressure 127/90.  HEENT:  Eyes are anicteric.  Oropharynx unremarkable.  SKIN:  Normal.  CHEST:  Clear.  HEART:  Heart sounds are distant, regular rate and rhythm.  ABDOMEN:  Abdomen is morbidly obese without mass, tenderness or  organomegaly.  There is no rebound or hernia.  Bowel sounds are normal.  RECTAL:  Exam is not performed.  EXTREMITIES:  Extremities have trace edema and are obese.   LABORATORY DATA:  Laboratory tests reveal a hemoglobin of 12.1, white blood  count 6.8, platelet count 213,000; prothrombin 13.2; electrolytes normal;  LFTs normal; CPK normal.   IMPRESSION:  Obese female with atypical chest pain.  I suspect this is not  of gastrointestinal origin.  Esophageal spasm should not last unrelentingly  for so long and she does not really describe any dysphagia.  Her pain does  not sound peptic.  It could be related to choledocholithiasis.  She is  status post a cholecystectomy, however, and this is unlikely, since her  liver function tests are normal.   RECOMMENDATIONS:  Upper endoscopy to rule out esophagitis or other peptic  disease or mass in the stomach is recommended and discussed with the patient  in terms of technique, preparation and risks of complications including  bleeding and perforation; she  agrees to proceed.  It will be performed as  soon as possible.  I would also consider repeating her LFTs and doing a  sonogram of the upper abdomen to see if there is anything in that area or  any sign of biliary obstruction.  Further recommendations to follow the  upper endoscopy.                                                 Althea Grimmer. Luther Parody, M.D.    PJS/MEDQ  D:  03/05/2002  T:  03/07/2002  Job:  [id number]   cc:   Lazaro Arms, MD  [address] [place], [place] [address]  Fax: [phone]   Holley Bouche, M.D.  [address]  [place], [place] [address]  Fax: [phone]   Meade Maw, M.D.  [address]  [place]  [place] [address]  Fax: [phone]

## 2010-10-06 NOTE — Discharge Summary (Signed)
[facility]  Patient:    EMME, ROSENAU                     MRN: [id number] Adm. Date:  [id number] Disc. Date: [id number] Attending:  Selina Cooley CC:         Arvella Merles, M.D.   Discharge Summary  DISCHARGE DIAGNOSES:  1. Reactive airway disease exacerbation failing outpatient therapy.  2. Environmental allergies.  3. Hypertension.  4. Herpes zoster affecting the right buttock, recurrent, on oral steroids.  5. Status post cholecystectomy.  6. Status post hysterectomy and bilateral salpingo-oophorectomy in July 1999     secondary to chronic pelvic pain and history of polycystic ovaries     disease.  7. History of extensive adhesions requiring supracervical hysterectomy by     Dr. Nicholas Lose.  8. Obesity.  9. Reported syncopal episode following the first administration of Advair.     However, a repeat of Advair this hospitalization resulted in no adverse     effects.  DISCHARGE MEDICATIONS:  1. Tequin 400 mg p.o. q.d. x 3 additional days.  2. Prednisone taper 40 mg p.o. q.d. x 2 days, 20 mg p.o. q.d. x 3 days, 10 mg     p.o. q.d. x 4 days, 5 mg p.o. q.d. x 5 days, then off.  3. Norvasc 10 mg p.o. q.d.  4. Accupril 40 mg p.o. q.d.  5. Hydrochlorothiazide 25 mg p.o. q.d.  6. Allegra 180 mg p.o. q.d.  7. Valaciclovir 1000 mg t.i.d. x 3 days.  8. Humibid long-acting 600 mg p.o. b.i.d. x 4 days.  9. Cepacol lozenges as needed. 10. Singulair 10 mg p.o. q.h.s. 11. Advair 100/50 mg noe puff b.i.d. 12. Albuterol one to two puffs q.4h. p.r.n. shortness of breath.  FOLLOW-UP:  Dr. Holley Bouche on Thursday, May 9, at 11:40 a.m.  REASON FOR ADMISSION:  The patient is a 50 year old African-American female with a history of reactive airway disease who had progressive symptoms of shortness of breath, wheezing, nonproductive cough.  As an outpatient, she was administered Z-Pak and an albuterol inhaler as well as Advair.  Apparently, she had a syncopal  episode after the first administration of Advair.  This was subsequently discontinued.  Furthermore, on follow-up, she was still complaining of asthmatic symptoms and was started on Singulair.  But, despite these interventions, she still complained of dyspnea on exertion and cough without marked improvement in her symptoms and was therefore admitted to the hospital.  HOSPITAL COURSE: #1 - REACTIVE AIRWAY DISEASE EXACERBATION:  The patient was placed on ______ (Tequin) for possible bronchitis and placed on high-dose IV steroids, albuterol and Atrovent nebs, and peak flows were monitored.  However, there was poor effort involved.  Initial peak flows were in the low 100 range.  The patient did improve symptomatically.  On the day prior to discharge, she was able to ambulate, saturating at 97% without marked dyspnea on exertion.  She did report a persistent cough, however, improvement in her shortness of breath and wheezing.  She was tried on Advair once again under supervision in the hospital without any adverse effects.  Therefore, she was placed back on Advair and it was felt that the syncopal episode was most likely vagally mediated and not directly related to the Advair Diskus ingredients.  She will be discharged on continuing oral antibiotics, Advair, prednisone taper, as well as an antitussive and albuterol rescue inhaler.  #2 - HERPES ZOSTER:  Patient has a history  of having zoster flares on immunosuppression and, indeed, when she was resumed on a prednisone taper as an outpatient, had a flare involving her right buttock.  The lesions were already healed at the time of admission and she was continued on her valaciclovir to complete a full course.  She is not complaining of any pain associated with that flare.  #3 - HYPERTENSION:  Well managed this hospitalization with no need to modify medications.  #4 - SEASONAL ALLERGIES:  Continued on Allegra.  Patient will follow up withDr.  Tiburcio Pea and is to continue to follow a low-fat, low-sodium diet. DD:  09/13/00 TD:  09/15/00 Job: [id number]

## 2010-10-17 ENCOUNTER — Other Ambulatory Visit (HOSPITAL_COMMUNITY): Payer: Self-pay | Admitting: Family Medicine

## 2010-10-17 DIAGNOSIS — Z1231 Encounter for screening mammogram for malignant neoplasm of breast: Secondary | ICD-10-CM

## 2010-11-06 ENCOUNTER — Ambulatory Visit (HOSPITAL_COMMUNITY)
Admission: RE | Admit: 2010-11-06 | Discharge: 2010-11-06 | Disposition: A | Discharge status: Home / Self Care | Payer: Medicare Other | Source: Ambulatory Visit | Attending: Family Medicine | Admitting: Family Medicine

## 2010-11-06 DIAGNOSIS — Z1231 Encounter for screening mammogram for malignant neoplasm of breast: Secondary | ICD-10-CM | POA: Insufficient documentation

## 2010-11-16 ENCOUNTER — Other Ambulatory Visit: Payer: Self-pay | Admitting: Neurosurgery

## 2010-11-16 DIAGNOSIS — M79604 Pain in right leg: Secondary | ICD-10-CM

## 2010-11-16 DIAGNOSIS — M545 Low back pain, unspecified: Secondary | ICD-10-CM

## 2010-11-16 DIAGNOSIS — M25551 Pain in right hip: Secondary | ICD-10-CM

## 2010-11-20 ENCOUNTER — Ambulatory Visit
Admission: RE | Admit: 2010-11-20 | Discharge: 2010-11-20 | Disposition: A | Discharge status: Home / Self Care | Payer: Medicare Other | Source: Ambulatory Visit | Attending: Neurosurgery | Admitting: Neurosurgery

## 2010-11-20 DIAGNOSIS — M545 Low back pain, unspecified: Secondary | ICD-10-CM

## 2010-11-20 DIAGNOSIS — M79604 Pain in right leg: Secondary | ICD-10-CM

## 2010-11-20 DIAGNOSIS — M25551 Pain in right hip: Secondary | ICD-10-CM

## 2010-12-28 ENCOUNTER — Other Ambulatory Visit: Payer: Self-pay | Admitting: Neurosurgery

## 2010-12-28 DIAGNOSIS — M25551 Pain in right hip: Secondary | ICD-10-CM

## 2010-12-31 ENCOUNTER — Ambulatory Visit
Admission: RE | Admit: 2010-12-31 | Discharge: 2010-12-31 | Disposition: A | Discharge status: Home / Self Care | Payer: Medicare Other | Source: Ambulatory Visit | Attending: Neurosurgery | Admitting: Neurosurgery

## 2010-12-31 DIAGNOSIS — M25551 Pain in right hip: Secondary | ICD-10-CM

## 2011-01-11 ENCOUNTER — Other Ambulatory Visit: Payer: Self-pay | Admitting: Neurosurgery

## 2011-01-11 DIAGNOSIS — M545 Low back pain, unspecified: Secondary | ICD-10-CM

## 2011-01-11 DIAGNOSIS — M5137 Other intervertebral disc degeneration, lumbosacral region: Secondary | ICD-10-CM

## 2011-01-11 DIAGNOSIS — M5126 Other intervertebral disc displacement, lumbar region: Secondary | ICD-10-CM

## 2011-01-11 DIAGNOSIS — M47817 Spondylosis without myelopathy or radiculopathy, lumbosacral region: Secondary | ICD-10-CM

## 2011-01-11 DIAGNOSIS — M25559 Pain in unspecified hip: Secondary | ICD-10-CM

## 2011-01-16 ENCOUNTER — Ambulatory Visit
Admission: RE | Admit: 2011-01-16 | Discharge: 2011-01-16 | Disposition: A | Discharge status: Home / Self Care | Payer: Medicare Other | Source: Ambulatory Visit | Attending: Neurosurgery | Admitting: Neurosurgery

## 2011-01-16 DIAGNOSIS — M5137 Other intervertebral disc degeneration, lumbosacral region: Secondary | ICD-10-CM

## 2011-01-16 DIAGNOSIS — M25559 Pain in unspecified hip: Secondary | ICD-10-CM

## 2011-01-16 DIAGNOSIS — M5126 Other intervertebral disc displacement, lumbar region: Secondary | ICD-10-CM

## 2011-01-16 DIAGNOSIS — M545 Low back pain, unspecified: Secondary | ICD-10-CM

## 2011-01-16 DIAGNOSIS — M47817 Spondylosis without myelopathy or radiculopathy, lumbosacral region: Secondary | ICD-10-CM

## 2011-01-16 MED ORDER — IOHEXOL 350 MG/ML SOLN: 165.0000 mL | Freq: Once | INTRAVENOUS | Status: AC | PRN

## 2011-02-16 ENCOUNTER — Emergency Department (HOSPITAL_BASED_OUTPATIENT_CLINIC_OR_DEPARTMENT_OTHER)
Admission: EM | Admit: 2011-02-16 | Discharge: 2011-02-17 | Disposition: A | Discharge status: Other Acute Inpatient Hospital | Payer: Medicare Other | Attending: Emergency Medicine | Admitting: Emergency Medicine

## 2011-02-16 ENCOUNTER — Other Ambulatory Visit: Payer: Self-pay

## 2011-02-16 ENCOUNTER — Emergency Department (INDEPENDENT_AMBULATORY_CARE_PROVIDER_SITE_OTHER): Payer: Medicare Other

## 2011-02-16 DIAGNOSIS — R0602 Shortness of breath: Secondary | ICD-10-CM | POA: Insufficient documentation

## 2011-02-16 DIAGNOSIS — R079 Chest pain, unspecified: Secondary | ICD-10-CM | POA: Insufficient documentation

## 2011-02-16 DIAGNOSIS — Z79899 Other long term (current) drug therapy: Secondary | ICD-10-CM | POA: Insufficient documentation

## 2011-02-16 DIAGNOSIS — R0789 Other chest pain: Secondary | ICD-10-CM

## 2011-02-16 DIAGNOSIS — I517 Cardiomegaly: Secondary | ICD-10-CM

## 2011-02-16 HISTORY — DX: Other pulmonary embolism without acute cor pulmonale: I26.99

## 2011-02-16 HISTORY — DX: Presence of other vascular implants and grafts: Z95.828

## 2011-02-16 HISTORY — DX: Cerebral infarction, unspecified: I63.9

## 2011-02-16 LAB — BASIC METABOLIC PANEL
BUN: 20 mg/dL (ref 6–23)
CO2: 27 mEq/L (ref 19–32)
Calcium: 9.4 mg/dL (ref 8.4–10.5)
Chloride: 101 mEq/L (ref 96–112)
Creatinine, Ser: 0.6 mg/dL (ref 0.50–1.10)
GFR calc Af Amer: >60 mL/min (ref >60)
GFR calc non Af Amer: >60 mL/min (ref >60)
Glucose, Bld: 99 mg/dL (ref 70–99)
Potassium: 4 mEq/L (ref 3.5–5.1)
Sodium: 138 mEq/L (ref 135–145)

## 2011-02-16 LAB — CBC
HCT: 40.4 % (ref 36.0–46.0)
Hemoglobin: 13.5 g/dL (ref 12.0–15.0)
MCH: 30.6 pg (ref 26.0–34.0)
MCHC: 33.4 g/dL (ref 30.0–36.0)
MCV: 91.6 fL (ref 78.0–100.0)
Platelets: 190 10*3/uL (ref 150–400)
RBC: 4.41 MIL/uL (ref 3.87–5.11)
RDW: 14.2 % (ref 11.5–15.5)
WBC: 8.6 10*3/uL (ref 4.0–10.5)

## 2011-02-16 LAB — CARDIAC PANEL(CRET KIN+CKTOT+MB+TROPI)
CK, MB: 1.7 ng/mL (ref 0.3–4.0)
Relative Index: INVALID (ref 0.0–2.5)
Total CK: 52 U/L (ref 7–177)
Troponin I: <0.3 ng/mL (ref <0.30)

## 2011-02-16 MED ORDER — NITROGLYCERIN 2 % TD OINT
1.0000 [in_us] | TOPICAL_OINTMENT | Freq: Once | TRANSDERMAL | Status: AC
  Administered 2011-02-17: 1 [in_us] via TOPICAL
  Filled 2011-02-16: qty 1

## 2011-02-16 MED ORDER — ASPIRIN 81 MG PO CHEW
324.0000 mg | CHEWABLE_TABLET | Freq: Once | ORAL | Status: AC
  Administered 2011-02-17: 324 mg via ORAL
  Filled 2011-02-16: qty 4

## 2011-02-16 NOTE — ED Notes (Signed)
Pt states that she has been intermittently experiencing chest pain since 1130 this morning.  Pt states that this pain comes intermittently feels like a squeezing sensation and then resolves leaving her with a feeling of increased resistance when trying to take a deep breath.  Pt reports hx of PE.

## 2011-02-16 NOTE — ED Notes (Signed)
Pt. In no resp. Distress and is alert and oriented.   Pt. Does have history of CVA with R arm weakness and with R leg weakness.  Noted edema in each leg.

## 2011-02-16 NOTE — ED Notes (Signed)
Pt. Reports to RN she does not want an IV in her if she can just have blood drawn.  No distress noted in the Pt.

## 2011-02-16 NOTE — ED Provider Notes (Addendum)
History     CSN: [id number] Arrival date & time: 02/16/2011  9:35 PM  Chief Complaint  Patient presents with  . Chest Pain  . Shortness of Breath    (Consider location/radiation/quality/duration/timing/severity/associated sxs/prior treatment) Patient is a 50 y.o. female presenting with chest pain and shortness of breath. The history is provided by the patient.  Chest Pain The chest pain began yesterday. Chest pain occurs intermittently. The chest pain is worsening. The severity of the pain is moderate. The quality of the pain is described as aching. The pain does not radiate. Primary symptoms include fatigue, shortness of breath and nausea. Pertinent negatives for primary symptoms include no fever, no cough, no abdominal pain, no vomiting and no dizziness.  Associated symptoms include lower extremity edema.  Pertinent negatives for associated symptoms include no diaphoresis, no near-syncope, no numbness and no weakness. Associated symptoms comments: Edema at baseline. She tried nothing for the symptoms.  Her past medical history is significant for PE and strokes. Past medical history comments: has Greenfield filter  Procedure history is positive for cardiac catheterization. Procedure history comments: last cath over 5 years ago.    Shortness of Breath  Associated symptoms include chest pain and shortness of breath. Pertinent negatives include no fever, no rhinorrhea and no cough. Past medical history comments: has Greenfield filter.    Past Medical History  Diagnosis Date  . Pulmonary embolism   . Stroke   . Embolism - blood clot     in left arm  . Presence of inferior vena cava filter     Past Surgical History  Procedure Date  . Bunionectomy     both feet  . Other surgical history     pt states that she had surgery to unclog her fallopean tubes  . Cholecystectomy   . Carpal tunnel release     right hand  . Hemorrhoid surgery   . Uterine fibroid surgery     x2  .  Tonsillectomy   . Heel spur surgery   . Abdominal hysterectomy   . Laparoscopic lysis intestinal adhesions   . Shoulder surgery   . Right elbow surgery   . Carpal tunnel release     left hand  . Gastric roux-en-y     History reviewed. No pertinent family history.  History  Substance Use Topics  . Smoking status: Never Smoker   . Smokeless tobacco: Never Used  . Alcohol Use: No    OB History    Grav Para Term Preterm Abortions TAB SAB Ect Mult Living                  Review of Systems  Constitutional: Positive for fatigue. Negative for fever, chills and diaphoresis.  HENT: Negative for congestion, rhinorrhea and sneezing.   Eyes: Negative.   Respiratory: Positive for chest tightness and shortness of breath. Negative for cough.   Cardiovascular: Positive for chest pain. Negative for leg swelling and near-syncope.  Gastrointestinal: Positive for nausea. Negative for vomiting, abdominal pain, diarrhea and blood in stool.  Genitourinary: Negative for frequency, hematuria, flank pain and difficulty urinating.  Musculoskeletal: Negative for back pain and arthralgias.  Skin: Negative for rash.  Neurological: Negative for dizziness, speech difficulty, weakness, numbness and headaches.    Allergies  Aspirin; Oxycodone hcl; Propoxyphene n-acetaminophen; Other; and Prednisone  Home Medications   Current Outpatient Rx  Name Route Sig Dispense Refill  . ACETAMINOPHEN 500 MG PO TABS Oral Take 1,000 mg by mouth every 4 (four) hours  as needed. For pain     . ALBUTEROL SULFATE HFA 108 (90 BASE) MCG/ACT IN AERS Inhalation Inhale 2 puffs into the lungs every 6 (six) hours as needed. Shortness of breath and wheezing     . BACLOFEN 20 MG PO TABS Oral Take 20 mg by mouth daily.      Marland Kitchen CALCIUM CITRATE 950 MG PO TABS Oral Take 1 tablet by mouth 2 (two) times daily.      Marland Kitchen CAMPHOR-MENTHOL-METHYL SAL 1.2-5.7-6.3 % EX PTCH Apply externally Apply 1 patch topically every 8 (eight) hours as  needed. For pain     . METHOCARBAMOL 500 MG PO TABS Oral Take 1,000 mg by mouth 4 (four) times daily.      Marland Kitchen ONE-DAILY MULTI VITAMINS PO TABS Oral Take 1 tablet by mouth daily.      Marland Kitchen SOLIFENACIN SUCCINATE 5 MG PO TABS Oral Take 10 mg by mouth daily.      Marland Kitchen VALACYCLOVIR HCL 500 MG PO TABS Oral Take 500 mg by mouth daily.        BP 136/72  Pulse 55  Temp(Src) 98.9 F (37.2 C) (Oral)  Resp 17  Ht 5\' 1"  (1.549 m)  Wt 194 lb (87.998 kg)  BMI 36.66 kg/m2  SpO2 100%  Physical Exam  Constitutional: She is oriented to person, place, and time. She appears well-developed and well-nourished.  HENT:  Head: Normocephalic and atraumatic.  Eyes: Pupils are equal, round, and reactive to light.  Neck: Normal range of motion. Neck supple.  Cardiovascular: Normal rate, regular rhythm and normal heart sounds.   Pulmonary/Chest: Effort normal and breath sounds normal. No respiratory distress. She has no wheezes. She has no rales. She exhibits no tenderness.  Abdominal: Soft. Bowel sounds are normal. There is no tenderness. There is no rebound and no guarding.  Musculoskeletal: Normal range of motion. She exhibits no edema.  Lymphadenopathy:    She has no cervical adenopathy.  Neurological: She is alert and oriented to person, place, and time.  Skin: Skin is warm and dry. No rash noted.  Psychiatric: She has a normal mood and affect.    ED Course  Procedures (including critical care time)  Results for orders placed during the hospital encounter of 02/16/11  CBC      Component Value Range   WBC 8.6  4.0 - 10.5 (K/uL)   RBC 4.41  3.87 - 5.11 (MIL/uL)   Hemoglobin 13.5  12.0 - 15.0 (g/dL)   HCT 44.0  34.7 - 42.5 (%)   MCV 91.6  78.0 - 100.0 (fL)   MCH 30.6  26.0 - 34.0 (pg)   MCHC 33.4  30.0 - 36.0 (g/dL)   RDW 95.6  38.7 - 56.4 (%)   Platelets 190  150 - 400 (K/uL)  BASIC METABOLIC PANEL      Component Value Range   Sodium 138  135 - 145 (mEq/L)   Potassium 4.0  3.5 - 5.1 (mEq/L)    Chloride 101  96 - 112 (mEq/L)   CO2 27  19 - 32 (mEq/L)   Glucose, Bld 99  70 - 99 (mg/dL)   BUN 20  6 - 23 (mg/dL)   Creatinine, Ser 3.32  0.50 - 1.10 (mg/dL)   Calcium 9.4  8.4 - 95.1 (mg/dL)   GFR calc non Af Amer >60  >60 (mL/min)   GFR calc Af Amer >60  >60 (mL/min)  CARDIAC PANEL(CRET KIN+CKTOT+MB+TROPI)      Component Value Range  Total CK 52  7 - 177 (U/L)   CK, MB 1.7  0.3 - 4.0 (ng/mL)   Troponin I <0.30  <0.30 (ng/mL)   Relative Index RELATIVE INDEX IS INVALID  0.0 - 2.5    Dg Chest Portable 1 View  02/16/2011  *RADIOLOGY REPORT*  Clinical Data: Mid chest pain  PORTABLE CHEST - 1 VIEW  Comparison: 06/20/2009  Findings: Lungs are clear. No pleural effusion or pneumothorax.  Mild cardiomegaly.  Cholecystectomy clips.  IMPRESSION: No evidence of acute cardiopulmonary disease.  Mild cardiomegaly.  Original Report Authenticated By: Charline Bills, M.D.   EKG: unchanged from previous tracings, nonspecific ST and T waves changes, sinus bradycardia at rate of 59, normal axis, no ST elevation.  Normal QRS, interpreted by me.   1. Chest pain     Pt does not have true allergy to asa, just gets stomach upset, given in ED, nitro applied.   MDM  Nothing to suggest PE, pneumonia, pleurisy.  Will consult hospitalist for admission for further cardiac evaluation Pt desires to go to [facility], accepted by Dr. Welton Flakes, MD 02/17/11 4540  Rolan Bucco, MD 03/08/11 1420

## 2011-02-17 NOTE — ED Notes (Signed)
Family at bedside. 

## 2011-02-17 NOTE — ED Notes (Signed)
Patient is resting comfortably. 

## 2011-02-17 NOTE — ED Notes (Signed)
Vital signs stable. 

## 2011-02-17 NOTE — ED Notes (Signed)
Pt. In no distress and is alert and oriented. 

## 2011-02-17 NOTE — ED Notes (Signed)
Pt. Is resting with no distress noted

## 2011-02-17 NOTE — ED Notes (Signed)
Will report to Alycia B RN and she will give report to [facility] and if any changes in pt. Status RN Rodney Booze will be notified.

## 2011-06-05 ENCOUNTER — Telehealth: Payer: Self-pay | Admitting: Oncology

## 2011-06-05 NOTE — Telephone Encounter (Signed)
Talked to pt, gave her appt date, time and location, she is aware of office telephone number.

## 2011-06-18 ENCOUNTER — Other Ambulatory Visit: Payer: Self-pay | Admitting: Oncology

## 2011-06-18 ENCOUNTER — Telehealth: Payer: Self-pay | Admitting: Oncology

## 2011-06-18 ENCOUNTER — Encounter: Payer: Self-pay | Admitting: Oncology

## 2011-06-18 DIAGNOSIS — Z95828 Presence of other vascular implants and grafts: Secondary | ICD-10-CM

## 2011-06-18 DIAGNOSIS — I639 Cerebral infarction, unspecified: Secondary | ICD-10-CM

## 2011-06-18 DIAGNOSIS — I82629 Acute embolism and thrombosis of deep veins of unspecified upper extremity: Secondary | ICD-10-CM

## 2011-06-18 DIAGNOSIS — I82409 Acute embolism and thrombosis of unspecified deep veins of unspecified lower extremity: Secondary | ICD-10-CM | POA: Insufficient documentation

## 2011-06-18 DIAGNOSIS — Z8673 Personal history of transient ischemic attack (TIA), and cerebral infarction without residual deficits: Secondary | ICD-10-CM | POA: Insufficient documentation

## 2011-06-18 DIAGNOSIS — Q211 Atrial septal defect: Secondary | ICD-10-CM

## 2011-06-18 DIAGNOSIS — Q2112 Patent foramen ovale: Secondary | ICD-10-CM | POA: Insufficient documentation

## 2011-06-18 DIAGNOSIS — Z9884 Bariatric surgery status: Secondary | ICD-10-CM

## 2011-06-18 NOTE — Telephone Encounter (Signed)
Referred by Dr. Zenovia Jordan Dx- DVT x 3 + PE

## 2011-06-20 ENCOUNTER — Ambulatory Visit: Payer: Medicare Other

## 2011-06-20 ENCOUNTER — Ambulatory Visit (HOSPITAL_BASED_OUTPATIENT_CLINIC_OR_DEPARTMENT_OTHER): Payer: Medicare Other

## 2011-06-20 ENCOUNTER — Other Ambulatory Visit: Payer: Medicare Other | Admitting: Lab

## 2011-06-20 ENCOUNTER — Ambulatory Visit (HOSPITAL_BASED_OUTPATIENT_CLINIC_OR_DEPARTMENT_OTHER): Payer: Medicare Other | Admitting: Oncology

## 2011-06-20 ENCOUNTER — Telehealth: Payer: Self-pay | Admitting: Oncology

## 2011-06-20 VITALS — BP 151/94 | HR 55 | Temp 97.8°F | Ht 61.0 in | Wt 197.9 lb

## 2011-06-20 DIAGNOSIS — I82629 Acute embolism and thrombosis of deep veins of unspecified upper extremity: Secondary | ICD-10-CM

## 2011-06-20 DIAGNOSIS — Z95828 Presence of other vascular implants and grafts: Secondary | ICD-10-CM

## 2011-06-20 DIAGNOSIS — I82409 Acute embolism and thrombosis of unspecified deep veins of unspecified lower extremity: Secondary | ICD-10-CM

## 2011-06-20 DIAGNOSIS — Z86718 Personal history of other venous thrombosis and embolism: Secondary | ICD-10-CM

## 2011-06-20 DIAGNOSIS — I639 Cerebral infarction, unspecified: Secondary | ICD-10-CM

## 2011-06-20 DIAGNOSIS — Q211 Atrial septal defect: Secondary | ICD-10-CM

## 2011-06-20 LAB — CBC & DIFF AND RETIC
BASO%: 0.6 % (ref 0.0–2.0)
Basophils Absolute: 0 10*3/uL (ref 0.0–0.1)
EOS%: 1.8 % (ref 0.0–7.0)
Eosinophils Absolute: 0.1 10*3/uL (ref 0.0–0.5)
HCT: 36 % (ref 34.8–46.6)
HGB: 11.9 g/dL (ref 11.6–15.9)
Immature Retic Fract: 5.4 % (ref 1.60–10.00)
LYMPH%: 42.4 % (ref 14.0–49.7)
MCH: 30.8 pg (ref 25.1–34.0)
MCHC: 33.1 g/dL (ref 31.5–36.0)
MCV: 93.3 fL (ref 79.5–101.0)
MONO#: 0.4 10*3/uL (ref 0.1–0.9)
MONO%: 7.5 % (ref 0.0–14.0)
NEUT#: 2.4 10*3/uL (ref 1.5–6.5)
NEUT%: 47.7 % (ref 38.4–76.8)
Platelets: 163 10*3/uL (ref 145–400)
RBC: 3.86 10*6/uL (ref 3.70–5.45)
RDW: 13.4 % (ref 11.2–14.5)
Retic %: 1.04 % (ref 0.70–2.10)
Retic Ct Abs: 40.14 10*3/uL (ref 33.70–90.70)
WBC: 5.1 10*3/uL (ref 3.9–10.3)
lymph#: 2.1 10*3/uL (ref 0.9–3.3)

## 2011-06-20 LAB — MORPHOLOGY
PLT EST: ADEQUATE
RBC Comments: NORMAL

## 2011-06-20 LAB — CHCC SMEAR

## 2011-06-20 NOTE — Progress Notes (Signed)
New Patient Hematology-Oncology Evaluation   [name] [id number] Sep 27, 1960 51 y.o. 06/20/2011  CC: Dr. Zenovia Jordan; and Dr. Nolene Ebbs; Dr. Olga Millers   Reason for referral: Advice on long-term anticoagulation   HPI: Rather complicated 51 year old woman referred by Dr. Zenovia Jordan, rheumatology, with the question of whether she lady needs to be back on long-term anticoagulation. Her clotting history started back in August of 2004 when she suffered a stroke. She was 52 years old. She was hypertensive at the time. During the course of the evaluation she was found to have a patent foramen ovale she remembers Dr. Jens Som was her cardiologist. She was started on anticoagulation. She had surgery on her left arm in February of 2007. Her Coumadin was stopped prior to the surgery. About 2 days postoperatively she developed a clot in her left arm. She was put back on the Coumadin. She had gastric bypass surgery at [facility] with a Roux-en-Y procedure in 2009. A prophylactic caval filter was placed at that time. She was continued on Coumadin for another 6 months and it was then stopped. She has had no subsequent thrombotic events. I have been able to find lab data back as far as 2004 when she had her initial clotting of that. On 01/08/2003 a functional protein C level was 155% of control (91-147). A total protein S. level was 78% (58-146). Subsequent data from 03/21/2003 showed normal antithrombin level 84% (75-120), normal total protein C 84% (75-120), normal functional protein C 89% (63-153), normal total protein as 129% (58-146), normal functional protein as 87% (81-180). Anticardiolipin antibodies were not detected. A lupus-type anticoagulant was not detected. The prothrombin gene mutation was not detected. Lab done 03/23/2003 negative ANA negative rheumatoid factor. Lab done 06/30/2003 anticardiolipin antibodies again negative. Lab done recently by Dr. Nickola Major office 06/04/2011 shows  negative anticardiolipin antibodies, negative antibodies against beta-2 glycoprotein 1, and negative lupus-type anticoagulant.  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 She was never on birth control pills for any length of time in the past. She is a nonsmoker. Her weight has been controlled since her gastric bypass surgery. She does have significant arthritis problems most of which sounds degenerative. She has required epidural steroid injections of her spine. Steroid injections to her knees. She has both large and small joint pain. She gets cold hands right greater than left but denies any cyanotic changes. She had previous carpal tunnel surgery on her left wrist. No known thyroid disease.  There is no family history of any clotting problems in her mother who is 57 years old her father who is 69 years old who was treated for prostate cancer, her -2 brothers or her 2 sisters. One brother died of HIV disease.    PMH: Past Medical History  Diagnosis Date  . Pulmonary embolism   . Stroke   . Embolism - blood clot     in left arm  . Presence of inferior vena cava filter   . DVT, lower extremity 06/18/2011  . DVT of upper extremity (deep vein thrombosis) 06/18/2011  . Greenfield filter in place 06/18/2011  . Stroke 06/18/2011  . PFO (patent foramen ovale) 06/18/2011  . Status post gastric bypass for obesity 06/18/2011   no history of hepatitis, yellow jaundice, malaria, mononucleosis, she had asthma in the past but no problems since 2002. Hypertension in the past but off medication since 2009. She has migraine headaches about every other  day and uses baclofen and Robaxin for control. She has required blood transfusions in the past with surgery in 2007.  Past Surgical History  Procedure Date  . Bunionectomy     both feet  . Other surgical history     pt states that she had surgery to unclog her fallopean tubes  . Cholecystectomy   .  Carpal tunnel release     right hand  . Hemorrhoid surgery   . Uterine fibroid surgery     x2  . Tonsillectomy   . Heel spur surgery   . Abdominal hysterectomy   . Laparoscopic lysis intestinal adhesions   . Shoulder surgery   . Right elbow surgery   . Carpal tunnel release     left hand  . Gastric roux-en-y     Allergies: Aspirin causes esophagitis Allergies  Allergen Reactions  . Aspirin     Stomach acid splashes back up  . Oxycodone Hcl Diarrhea and Nausea And Vomiting  . Propoxyphene N-Acetaminophen Diarrhea and Nausea And Vomiting  . Other Rash    Adhesive tape Reaction: takes skin off  . Prednisone Rash    Medications: Medications Prior to Admission  Medication Sig Dispense Refill  . acetaminophen (TYLENOL) 500 MG tablet Take 1,000 mg by mouth every 4 (four) hours as needed. For pain       . albuterol (PROVENTIL HFA;VENTOLIN HFA) 108 (90 BASE) MCG/ACT inhaler Inhale 2 puffs into the lungs every 6 (six) hours as needed. Shortness of breath and wheezing       . baclofen (LIORESAL) 20 MG tablet Take 20 mg by mouth daily.        . calcium citrate (CALCITRATE - DOSED IN MG ELEMENTAL CALCIUM) 950 MG tablet Take 1 tablet by mouth 2 (two) times daily.        . Camphor-Menthol-Methyl Sal (SALONPAS) 1.2-5.7-6.3 % PTCH Apply 1 patch topically every 8 (eight) hours as needed. For pain       . methocarbamol (ROBAXIN) 500 MG tablet Take 1,000 mg by mouth 4 (four) times daily.        . Multiple Vitamin (MULTIVITAMIN) tablet Take 1 tablet by mouth daily.        . solifenacin (VESICARE) 5 MG tablet Take 10 mg by mouth daily.        . valACYclovir (VALTREX) 500 MG tablet Take 500 mg by mouth daily.             Social History:   reports that she has never smoked. She has never used smokeless tobacco. She reports that she does not drink alcohol or use illicit drugs. currently disabled. 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 No children. Miscarriage x3 as noted above.  Family  History: See above  Review of Systems: Constitutional symptoms: No fever anorexia or weight loss HEENT: Intermittent migraine headaches occurring every other day relieved with Tylenol  Respiratory: No cough or dyspnea Cardiovascular: No chest pain chest pressure palpitation  Gastrointestinal ROS: No change in bowel habit no abdominal pain no hematochezia or melena Genito-Urinary ROS: Overactive bladder for which she uses Vesicare Hematological and Lymphatic: Musculoskeletal: Chronic muscle and joint pain; cold hands right greater than left. No real Raynaud's phenomenon. Neurologic: No significant headache, change in vision, focal weakness, slurred speech. No paresthesias. Dermatologic: No skin sensitivity no recurrent rashes no spontaneous bruising Remaining ROS negative.  Physical Exam: Blood pressure 151/94, pulse 55, temperature 97.8 F (36.6 C), temperature source Oral, height 5\' 1"  (1.549 m), weight 197  lb 14.4 oz (89.767 kg). Wt Readings from Last 3 Encounters:  06/20/11 197 lb 14.4 oz (89.767 kg)  02/16/11 194 lb (87.998 kg)  07/20/08 191 lb 4 oz (86.75 kg)    General appearance: Well-nourished African American woman Head: Normal Neck: Full range of motion Lymph nodes: No lymphadenopathy Breasts: Not examined Lungs: Clear to auscultation resonant to percussion Heart: Regular cardiac rhythm very faint one over two aortic systolic murmur Abdominal: Abdomen soft nontender no mass no organomegaly GU: Not examined Extremities: No edema no calf tenderness Neurologic: Pupils equal reactive to light optic discs sharp vessels normal no hemorrhage or exudate motor strength is 5 over 5 reflexes 1+ symmetric upper body coordination is normal sensation is intact to vibration over the fingertips by tuning fork exam Skin: No rash or ecchymoses Vascular: Radial pulses are 2+ symmetric ulnar pulses 1+ symmetric there is a scar on the left wrist status post previous carpal tunnel  surgery    Lab Results: Lab Results  Component Value Date   WBC 8.6 02/16/2011   HGB 13.5 02/16/2011   HCT 40.4 02/16/2011   MCV 91.6 02/16/2011   PLT 190 02/16/2011     Chemistry      Component Value Date/Time   NA 138 02/16/2011 2248   K 4.0 02/16/2011 2248   CL 101 02/16/2011 2248   CO2 27 02/16/2011 2248   BUN 20 02/16/2011 2248   CREATININE 0.60 02/16/2011 2248      Component Value Date/Time   CALCIUM 9.4 02/16/2011 2248       Pathology:   Review of peripheral blood film: Normal occasional benign reactive lymphocytes   Radiological Studies:    Impression and Plan: 51 year old woman who had a stroke at age 62 and found to have a PFO. Subsequent left upper extremity thrombosis perioperatively after left shoulder surgery in February 2007. History of miscarriages. No recurrent thrombotic events now for 6 years and off Coumadin for 4 years. There is no evidence that she has the antiphospholipid antibody syndrome. Anticardiolipin antibodies, and lupus anticoagulant testing done repetitively over the last 9 years has always been normal. Recent retesting done earlier this month remains normal including negative antibodies against beta-2 glycoprotein 1 as well as a persistent negative  lupus-type anticoagulant. ANA  and rheumatoid factor negative. Protein S., protein C, antithrombin, all normal. Prothrombin gene mutation not detected. The only thing that not been tested for yet is the factor V Leiden gene mutation which is uncommon in African Americans. I will go ahead and test for this at this time. However even if she is a heterozygote for this gene mutation, it would not obligate her to go back on anticoagulation long-term.  It is very difficult to know whether the PFO was the reason that she had the stroke at age 14. Many people have small PFO  which remains asymptomatic for their entire lives. Only a small proportion of people actually develop paradoxical embolization. I think it  would be reasonable to reevaluate the PFO to see if it is large enough to require surgical repair now that the techniques have improved. I will defer to her cardiologist judgment in this regard.  I told her I don't see any major reason for her to go back on full dose anticoagulation at this time but that there are many things that we still don't understand about clotting tendencies and that certainly if she had another clot in the future then I would put her on long-term anticoagulation at that  point.        Levert Feinstein, MD 06/20/2011, 2:11 PM

## 2011-06-20 NOTE — Telephone Encounter (Signed)
Gv pt appt for [id number].  sent pt to labs

## 2011-06-22 LAB — BETA-2 GLYCOPROTEIN ANTIBODIES
Beta-2 Glyco I IgG: 0 G Units (ref <20)
Beta-2-Glycoprotein I IgA: 4 A Units (ref <20)
Beta-2-Glycoprotein I IgM: 0 M Units (ref <20)

## 2011-06-22 LAB — FACTOR 5 LEIDEN

## 2011-07-20 DIAGNOSIS — Z8619 Personal history of other infectious and parasitic diseases: Secondary | ICD-10-CM

## 2011-07-20 HISTORY — DX: Personal history of other infectious and parasitic diseases: Z86.19

## 2011-08-20 HISTORY — PX: BUNIONECTOMY: SHX129

## 2011-08-22 ENCOUNTER — Other Ambulatory Visit: Payer: Self-pay | Admitting: Orthopedic Surgery

## 2011-10-01 ENCOUNTER — Encounter (HOSPITAL_BASED_OUTPATIENT_CLINIC_OR_DEPARTMENT_OTHER): Payer: Self-pay | Admitting: *Deleted

## 2011-10-01 NOTE — Pre-Procedure Instructions (Signed)
History reviewed by Dr. Gelene Mink, pt. OK to come for surg.

## 2011-10-01 NOTE — Pre-Procedure Instructions (Signed)
To come for BMET 

## 2011-10-02 ENCOUNTER — Encounter (HOSPITAL_BASED_OUTPATIENT_CLINIC_OR_DEPARTMENT_OTHER)
Admission: RE | Admit: 2011-10-02 | Discharge: 2011-10-02 | Disposition: A | Discharge status: Home / Self Care | Payer: Medicare Other | Source: Ambulatory Visit | Attending: Orthopedic Surgery | Admitting: Orthopedic Surgery

## 2011-10-02 LAB — BASIC METABOLIC PANEL
BUN: 9 mg/dL (ref 6–23)
CO2: 30 mEq/L (ref 19–32)
Calcium: 9.1 mg/dL (ref 8.4–10.5)
Chloride: 102 mEq/L (ref 96–112)
Creatinine, Ser: 0.56 mg/dL (ref 0.50–1.10)
GFR calc Af Amer: >90 mL/min (ref >90)
GFR calc non Af Amer: >90 mL/min (ref >90)
Glucose, Bld: 133 mg/dL — ABNORMAL HIGH (ref 70–99)
Potassium: 3.6 mEq/L (ref 3.5–5.1)
Sodium: 139 mEq/L (ref 135–145)

## 2011-10-03 ENCOUNTER — Encounter (HOSPITAL_BASED_OUTPATIENT_CLINIC_OR_DEPARTMENT_OTHER): Payer: Self-pay | Admitting: Anesthesiology

## 2011-10-03 ENCOUNTER — Ambulatory Visit (HOSPITAL_BASED_OUTPATIENT_CLINIC_OR_DEPARTMENT_OTHER)
Admission: RE | Admit: 2011-10-03 | Discharge: 2011-10-03 | Disposition: A | Discharge status: Home / Self Care | Payer: Medicare Other | Source: Ambulatory Visit | Attending: Orthopedic Surgery | Admitting: Orthopedic Surgery

## 2011-10-03 ENCOUNTER — Ambulatory Visit (HOSPITAL_BASED_OUTPATIENT_CLINIC_OR_DEPARTMENT_OTHER): Payer: Medicare Other | Admitting: Anesthesiology

## 2011-10-03 ENCOUNTER — Encounter (HOSPITAL_BASED_OUTPATIENT_CLINIC_OR_DEPARTMENT_OTHER)
Admission: RE | Disposition: A | Discharge status: Home / Self Care | Payer: Self-pay | Source: Ambulatory Visit | Attending: Orthopedic Surgery

## 2011-10-03 ENCOUNTER — Encounter (HOSPITAL_BASED_OUTPATIENT_CLINIC_OR_DEPARTMENT_OTHER): Payer: Self-pay | Admitting: Orthopedic Surgery

## 2011-10-03 DIAGNOSIS — R51 Headache: Secondary | ICD-10-CM | POA: Insufficient documentation

## 2011-10-03 DIAGNOSIS — Z8673 Personal history of transient ischemic attack (TIA), and cerebral infarction without residual deficits: Secondary | ICD-10-CM | POA: Insufficient documentation

## 2011-10-03 DIAGNOSIS — G4730 Sleep apnea, unspecified: Secondary | ICD-10-CM | POA: Insufficient documentation

## 2011-10-03 DIAGNOSIS — J45909 Unspecified asthma, uncomplicated: Secondary | ICD-10-CM | POA: Insufficient documentation

## 2011-10-03 DIAGNOSIS — G5600 Carpal tunnel syndrome, unspecified upper limb: Secondary | ICD-10-CM | POA: Insufficient documentation

## 2011-10-03 HISTORY — DX: Other complications of anesthesia, initial encounter: T88.59XA

## 2011-10-03 HISTORY — DX: Other chronic pain: G89.29

## 2011-10-03 HISTORY — DX: Presence of dental prosthetic device (complete) (partial): Z97.2

## 2011-10-03 HISTORY — DX: Dental restoration status: Z98.811

## 2011-10-03 HISTORY — DX: Adverse effect of unspecified anesthetic, initial encounter: T41.45XA

## 2011-10-03 HISTORY — DX: Unspecified osteoarthritis, unspecified site: M19.90

## 2011-10-03 HISTORY — DX: Sleep apnea, unspecified: G47.30

## 2011-10-03 HISTORY — DX: Dorsalgia, unspecified: M54.9

## 2011-10-03 HISTORY — PX: CARPAL TUNNEL RELEASE: SHX101

## 2011-10-03 HISTORY — DX: Headache: R51

## 2011-10-03 HISTORY — DX: Personal history of other infectious and parasitic diseases: Z86.19

## 2011-10-03 SURGERY — CARPAL TUNNEL RELEASE
Anesthesia: Choice | Site: Wrist | Laterality: Right | Wound class: Clean

## 2011-10-03 MED ORDER — FENTANYL CITRATE 0.05 MG/ML IJ SOLN
INTRAMUSCULAR | Status: DC | PRN
  Administered 2011-10-03: 50 ug via INTRAVENOUS
  Administered 2011-10-03: 100 ug via INTRAVENOUS

## 2011-10-03 MED ORDER — ONDANSETRON HCL 4 MG/2ML IJ SOLN
INTRAMUSCULAR | Status: DC | PRN
  Administered 2011-10-03: 4 mg via INTRAVENOUS

## 2011-10-03 MED ORDER — FENTANYL CITRATE 0.05 MG/ML IJ SOLN: 50.0000 ug | INTRAMUSCULAR | Status: DC | PRN

## 2011-10-03 MED ORDER — CHLORHEXIDINE GLUCONATE 4 % EX LIQD: 60.0000 mL | Freq: Once | CUTANEOUS | Status: DC

## 2011-10-03 MED ORDER — MIDAZOLAM HCL 2 MG/2ML IJ SOLN: 0.5000 mg | INTRAMUSCULAR | Status: DC | PRN

## 2011-10-03 MED ORDER — PROPOFOL 10 MG/ML IV EMUL
INTRAVENOUS | Status: DC | PRN
  Administered 2011-10-03: 200 mg via INTRAVENOUS

## 2011-10-03 MED ORDER — PENTAZOCINE-NALOXONE 50-0.5 MG PO TABS: 1.0000 | ORAL_TABLET | ORAL | Status: AC | PRN

## 2011-10-03 MED ORDER — BUPIVACAINE HCL (PF) 0.25 % IJ SOLN
INTRAMUSCULAR | Status: DC | PRN
  Administered 2011-10-03: 6 mL

## 2011-10-03 MED ORDER — METOCLOPRAMIDE HCL 5 MG/ML IJ SOLN
INTRAMUSCULAR | Status: DC | PRN
  Administered 2011-10-03: 10 mg via INTRAVENOUS

## 2011-10-03 MED ORDER — CEFAZOLIN SODIUM 1-5 GM-% IV SOLN: 1.0000 g | INTRAVENOUS | Status: DC

## 2011-10-03 MED ORDER — CEFAZOLIN SODIUM-DEXTROSE 2-3 GM-% IV SOLR
2.0000 g | INTRAVENOUS | Status: AC
  Administered 2011-10-03: 2 g via INTRAVENOUS

## 2011-10-03 MED ORDER — MIDAZOLAM HCL 5 MG/5ML IJ SOLN
INTRAMUSCULAR | Status: DC | PRN
  Administered 2011-10-03: 2 mg via INTRAVENOUS

## 2011-10-03 MED ORDER — LACTATED RINGERS IV SOLN
INTRAVENOUS | Status: DC
  Administered 2011-10-03 (×3): via INTRAVENOUS

## 2011-10-03 SURGICAL SUPPLY — 38 items
BANDAGE GAUZE ELAST BULKY 4 IN (GAUZE/BANDAGES/DRESSINGS) ×2 IMPLANT
BLADE SURG 15 STRL LF DISP TIS (BLADE) ×1 IMPLANT
BLADE SURG 15 STRL SS (BLADE) ×2
BNDG CMPR 9X4 STRL LF SNTH (GAUZE/BANDAGES/DRESSINGS) ×1
BNDG COHESIVE 3X5 TAN STRL LF (GAUZE/BANDAGES/DRESSINGS) ×2 IMPLANT
BNDG ESMARK 4X9 LF (GAUZE/BANDAGES/DRESSINGS) ×1 IMPLANT
CHLORAPREP W/TINT 26ML (MISCELLANEOUS) ×2 IMPLANT
CLOTH BEACON ORANGE TIMEOUT ST (SAFETY) ×2 IMPLANT
CORDS BIPOLAR (ELECTRODE) ×2 IMPLANT
COVER MAYO STAND STRL (DRAPES) ×2 IMPLANT
COVER TABLE BACK 60X90 (DRAPES) ×2 IMPLANT
CUFF TOURNIQUET SINGLE 18IN (TOURNIQUET CUFF) ×2 IMPLANT
CUFF TOURNIQUET SINGLE 24IN (TOURNIQUET CUFF) ×1 IMPLANT
DRAPE EXTREMITY T 121X128X90 (DRAPE) ×2 IMPLANT
DRAPE SURG 17X23 STRL (DRAPES) ×2 IMPLANT
DRSG KUZMA FLUFF (GAUZE/BANDAGES/DRESSINGS) ×2 IMPLANT
GAUZE XEROFORM 1X8 LF (GAUZE/BANDAGES/DRESSINGS) ×2 IMPLANT
GLOVE BIO SURGEON STRL SZ 6.5 (GLOVE) ×3 IMPLANT
GLOVE SURG ORTHO 8.0 STRL STRW (GLOVE) ×2 IMPLANT
GOWN BRE IMP PREV XXLGXLNG (GOWN DISPOSABLE) ×2 IMPLANT
GOWN PREVENTION PLUS XLARGE (GOWN DISPOSABLE) ×2 IMPLANT
NEEDLE 27GAX1X1/2 (NEEDLE) ×1 IMPLANT
NS IRRIG 1000ML POUR BTL (IV SOLUTION) ×2 IMPLANT
PACK BASIN DAY SURGERY FS (CUSTOM PROCEDURE TRAY) ×2 IMPLANT
PAD CAST 3X4 CTTN HI CHSV (CAST SUPPLIES) ×1 IMPLANT
PADDING CAST ABS 4INX4YD NS (CAST SUPPLIES) ×1
PADDING CAST ABS COTTON 4X4 ST (CAST SUPPLIES) ×1 IMPLANT
PADDING CAST COTTON 3X4 STRL (CAST SUPPLIES) ×2
SPLINT PLASTER CAST XFAST 3X15 (CAST SUPPLIES) IMPLANT
SPLINT PLASTER XTRA FASTSET 3X (CAST SUPPLIES) ×10
SPONGE GAUZE 4X4 12PLY (GAUZE/BANDAGES/DRESSINGS) ×2 IMPLANT
STOCKINETTE 4X48 STRL (DRAPES) ×2 IMPLANT
SUT VICRYL 4-0 PS2 18IN ABS (SUTURE) ×1 IMPLANT
SUT VICRYL RAPIDE 4/0 PS 2 (SUTURE) ×2 IMPLANT
SYR BULB 3OZ (MISCELLANEOUS) ×2 IMPLANT
SYR CONTROL 10ML LL (SYRINGE) ×1 IMPLANT
TOWEL OR 17X24 6PK STRL BLUE (TOWEL DISPOSABLE) ×2 IMPLANT
UNDERPAD 30X30 INCONTINENT (UNDERPADS AND DIAPERS) ×2 IMPLANT

## 2011-10-03 NOTE — Op Note (Signed)
NAMEWYLEE, [name]              ACCOUNT NO.:  [id number]  MEDICAL RECORD NO.:  [id number]  LOCATION:                                 FACILITY:  PHYSICIAN:  Cindee Salt, M.D.            DATE OF BIRTH:  DATE OF PROCEDURE:  10/03/2011 DATE OF DISCHARGE:                              OPERATIVE REPORT   PREOPERATIVE DIAGNOSIS:  Recurrent carpal tunnel syndrome, right hand.  POSTOPERATIVE DIAGNOSIS:  Recurrent carpal tunnel syndrome, right hand.  OPERATION:  Re-release of carpal canal with hypothenar fat pad transfer, right hand.  SURGEON:  Cindee Salt, M.D.  ANESTHESIA:  General with local infiltration.  ANESTHESIOLOGIST:  Janetta Hora. Gelene Mink, M.D.  HISTORY:  The patient is a 51 year old female, complaining of numbness and tingling.  Nerve conduction is positive.  She has undergone carpal tunnel release approximately 4 years ago, had a totally symptom-free interval.  She has had recurrence of symptoms.  Repeat nerve conductions reveal continued changes and an MRI reveals swelling of the nerve. Pre, peri, and postoperative course had been discussed along with risks and complications including the possibility of infection; recurrence; injury to arteries, nerves, and tendons; incomplete relief of symptoms; dystrophy; possibility of further surgical intervention being necessary. In the preoperative area, the patient is seen, the extremity marked by both the patient and surgeon.  Antibiotic given.  PROCEDURE:  The patient was brought to the operating room where a general anesthetic was carried out without difficulty.  She was prepped using ChloraPrep, supine position with the right arm free.  A 3-minute dry time was allowed.  Time-out taken, confirming the patient and procedure.  The limb was exsanguinated with an Esmarch bandage. Tourniquet placed high on the arm was inflated to 250 mmHg.  The old incision was used, carried down through subcutaneous tissue.  At the wrist crease,  this was carried to the ulnar side to the level of the pisiform and then carried down to the distal forearm.  Dissection was carried through a moderate amount of scar.  The median nerve was identified proximally, this was then released in a proximal to distal direction, taken care to protect the entire nerve.  Very significant scarring was present.  Adhesions in the mid-portion of the carpal canal to the flexor retinaculum were noted, these were released, and an epineurolysis was performed.  Motor branch was noted, branches to each of the fingers noted distally.  The ulnar nerve was then identified proximally.  The hypothenar fat pad transfer was then made.  The flap was made to maintain contiguous with the branches from the ulnar artery, released from the dermis on the ulnar aspect and transferred radially. The wound was copiously irrigated with saline.  The median and ulnar nerves were then bathed with 0.25% Marcaine without epinephrine.  The hypothenar fat pad was then transferred over into the carpal canal and sutured to the volar aspect of the flexor retinaculum with interrupted 4- 0 Vicryl sutures.  The wound was again irrigated.  The subcutaneous tissue closed as much as possible with 4-0 Vicryl and the skin with interrupted 4-0 Vicryl Rapide.  A local infiltration was then given with  0.25% Marcaine without epinephrine to the incision area.  A sterile compressive dressing and palmar splint with the fingers free was applied.  On deflation of the tourniquet, all fingers immediately pinked.  She was taken to the recovery room.          ______________________________ Cindee Salt, M.D.     GK/MEDQ  D:  10/03/2011  T:  10/03/2011  Job:  [id number]

## 2011-10-03 NOTE — Anesthesia Postprocedure Evaluation (Signed)
Anesthesia Post Note  Patient: [name]  Procedure(s) Performed: Procedure(s) (LRB): CARPAL TUNNEL RELEASE (Right)  Anesthesia type: General  Patient location: PACU  Post pain: Pain level controlled  Post assessment: Patient's Cardiovascular Status Stable  Last Vitals:  Filed Vitals:   10/03/11 1045  BP: 142/80  Pulse: 65  Temp:   Resp: 12    Post vital signs: Reviewed and stable  Level of consciousness: alert  Complications: No apparent anesthesia complications

## 2011-10-03 NOTE — Anesthesia Preprocedure Evaluation (Signed)
Anesthesia Evaluation  Patient identified by MRN, date of birth, ID band Patient awake    Reviewed: Allergy & Precautions, H&P , NPO status , Patient's Chart, lab work & pertinent test results, reviewed documented beta blocker date and time   Airway Mallampati: II TM Distance: >3 FB Neck ROM: full    Dental   Pulmonary neg pulmonary ROS, asthma , sleep apnea ,          Cardiovascular negative cardio ROS      Neuro/Psych  Headaches,  Neuromuscular disease CVA negative psych ROS   GI/Hepatic negative GI ROS, Neg liver ROS,   Endo/Other  negative endocrine ROS  Renal/GU negative Renal ROS  negative genitourinary   Musculoskeletal   Abdominal   Peds  Hematology negative hematology ROS (+)   Anesthesia Other Findings See surgeon's H&P   Reproductive/Obstetrics negative OB ROS                           Anesthesia Physical Anesthesia Plan  ASA: III  Anesthesia Plan: MAC and Bier Block   Post-op Pain Management:    Induction: Intravenous  Airway Management Planned: Simple Face Mask  Additional Equipment:   Intra-op Plan:   Post-operative Plan: Extubation in OR  Informed Consent: I have reviewed the patients History and Physical, chart, labs and discussed the procedure including the risks, benefits and alternatives for the proposed anesthesia with the patient or authorized representative who has indicated his/her understanding and acceptance.   Dental Advisory Given  Plan Discussed with: CRNA and Surgeon  Anesthesia Plan Comments:         Anesthesia Quick Evaluation

## 2011-10-03 NOTE — Discharge Instructions (Addendum)

## 2011-10-03 NOTE — Op Note (Signed)
Dictated [phone]

## 2011-10-03 NOTE — H&P (Signed)
[name] is a 51 yo female complaining of pain in both hands.  She recently has seen Dr. Murray Hodgkins and Dr. Wynetta Emery for consultation with respect to her back.  She had her nerve conductions repeated by Dr. Murray Hodgkins revealing a sensory delay of 3.3 on the right and 3.2 on the left.  A motor of 4.6 on the right and 4.5 on the left.  She is complaining of some tingling in the hand. She states that following her surgery she had total resolution of her symptoms except for the cold intolerance. She is now 18 months following surgery. She is complaining of catching of her right thumb, discomfort at the metacarpophalangeal joint of her left thumb. She is not taking anything other than Tylenol. She has had her MRI done of her carpal tunnel. 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 The nerve measures 8.5 mm. It is extremely bright on 2 views.  ALLERGIES:   Darvocet, oxycodone, Oxycet.   MEDICATIONS:   Methocarbamol, Vesicare, B-12, Calcium, Centrum one a day.  She has been on Coumadin.  PAST SURGICAL HISTORY:  STS release left thumb, carpal tunnel release right hand.  FAMILY MEDICAL HISTORY/SOCIAL HISTORY/REVIEW OF SYSTEMS:  All unchanged. [name] is an 51 y.o. female.   Chief Complaint: recurrent CTS rt HPI: see above  Past Medical History  Diagnosis Date  . Pulmonary embolism   . Presence of inferior vena cava filter   . PFO (patent foramen ovale)   . Status post gastric bypass for obesity   . Headache     migraines  . Arthritis     osteoarthritis  . Urinary frequency   . Urinary incontinence   . Chronic back pain   . Sleep apnea     used CPAP until after bypass surg.  . Stroke 12/2002    right-sided weakness  . DVT of upper extremity (deep vein thrombosis)   . History of shingles 07/2011  . Complication of anesthesia     states takes more than normal to put her to sleep  . Asthma     states no  asthma attack since 2002  . Dental crowns present   . Dental bridge present     upper    Past Surgical History  Procedure Date  . Bunionectomy     both feet  . Other surgical history     pt states that she had surgery to unclog her fallopean tubes  . Cholecystectomy   . Hemorrhoid surgery   . Uterine fibroid surgery     x2  . Tonsillectomy   . Heel spur surgery     left  . Laparoscopic lysis intestinal adhesions 02/14/2000  . Shoulder surgery     bilat. - (left:  06/2005)  . Carpal tunnel release 06/21/2009    right  . Gastric roux-en-y 2009  . Cardiac catheterization 03/04/2002  . Anterior cervical decomp/discectomy fusion 02/05/2005    C5-6  . Trigger finger release 04/25/2006    decompression A-1 pulley left thumb  . Elbow surgery 08/09/2004    decompression ulnar nerve right elbow  . Enterolysis 10/22/2008    laparoscopic abd. enterolysis  . Appendectomy 10/22/2008    laparoscopic  . Nailbed repair 01/10/2005; 08/2011    exc. matrix bilat. great toe  . Abdominal hysterectomy     complete  . Bunionectomy 08/2011    left foot  . Vena cava filter  placement 2009    during Roux-en-Y surg.  . Carpal tunnel release     left hand    History reviewed. No pertinent family history. Social History:  reports that she has quit smoking. She has never used smokeless tobacco. She reports that she does not drink alcohol or use illicit drugs.  Allergies:  Allergies  Allergen Reactions  . Oxycodone Hcl Diarrhea and Nausea And Vomiting  . Propoxyphene-Acetaminophen Diarrhea and Nausea And Vomiting  . Adhesive (Tape) Rash    PULLS SKIN OFF  . Aspirin Other (See Comments)    ESOPHAGITIS  . Prednisone Rash    No prescriptions prior to admission    Results for orders placed during the hospital encounter of 10/03/11 (from the past 48 hour(s))  BASIC METABOLIC PANEL     Status: Abnormal   Collection Time   10/02/11 11:45 AM      Component Value Range Comment   Sodium 139  135 - 145  (mEq/L)    Potassium 3.6  3.5 - 5.1 (mEq/L)    Chloride 102  96 - 112 (mEq/L)    CO2 30  19 - 32 (mEq/L)    Glucose, Bld 133 (*) 70 - 99 (mg/dL)    BUN 9  6 - 23 (mg/dL)    Creatinine, Ser 1.61  0.50 - 1.10 (mg/dL)    Calcium 9.1  8.4 - 10.5 (mg/dL)    GFR calc non Af Amer >90  >90 (mL/min)    GFR calc Af Amer >90  >90 (mL/min)     No results found.   ROS: Glasses  stroke  Height 5\' 1"  (1.549 m), weight 88.905 kg (196 lb).  General appearance: cooperative and appears stated age Head: Normocephalic, without obvious abnormality Neck: no adenopathy Resp: clear to auscultation bilaterally Cardio: regular rate and rhythm, S1, S2 normal, no murmur, click, rub or gallop GI: soft, non-tender; bowel sounds normal; no masses,  no organomegaly Extremities: extremities normal, atraumatic, no cyanosis or edema Pulses: 2+ and symmetric Skin: Skin color, texture, turgor normal. No rashes or lesions Neurologic: Grossly normal Incision/Wound: na  Assessment/Plan Recurrent CTS We would recommend re-release with hypothenar fat pad transfer. The pre, peri and post op course are discussed along with risks and complications. She is aware there is no guarantee with surgery, possibility of infection, recurrence, injury to arteries, nerves and tendons, incomplete relief of symptoms and dystrophy.  She would like to proceed. She is scheduled for re-release right carpal canal with hypothenar fat pad transfer.  Royal Beirne R 10/03/2011, 5:42 AM

## 2011-10-03 NOTE — Transfer of Care (Signed)
Immediate Anesthesia Transfer of Care Note  Patient: [name]  Procedure(s) Performed: Procedure(s) (LRB): CARPAL TUNNEL RELEASE (Right)  Patient Location: PACU  Anesthesia Type: General  Level of Consciousness: awake and alert   Airway & Oxygen Therapy: Patient Spontanous Breathing and Patient connected to face mask oxygen  Post-op Assessment: Report given to PACU RN and Post -op Vital signs reviewed and stable  Post vital signs: Reviewed and stable  Complications: No apparent anesthesia complications

## 2011-10-03 NOTE — Brief Op Note (Signed)
10/03/2011  9:52 AM  PATIENT:  [name]  51 y.o. female  PRE-OPERATIVE DIAGNOSIS:  RECURRENT CTS RIGHT  POST-OPERATIVE DIAGNOSIS:  RECURRENT CTS RIGHT  PROCEDURE:  Procedure(s) (LRB): CARPAL TUNNEL RELEASE (Right)  SURGEON:  Surgeon(s) and Role:    * Nicki Reaper, MD - Primary  PHYSICIAN ASSISTANT:   ASSISTANTS: none   ANESTHESIA:   local and general  EBL:     BLOOD ADMINISTERED:none  DRAINS: none   LOCAL MEDICATIONS USED:  MARCAINE     SPECIMEN:  No Specimen  DISPOSITION OF SPECIMEN:  N/A  COUNTS:  YES  TOURNIQUET:   Total Tourniquet Time Documented: Forearm (Right) - 40 minutes  DICTATION: .Other Dictation: Dictation Number [id number]  PLAN OF CARE: Discharge to home after PACU  PATIENT DISPOSITION:  PACU - hemodynamically stable.

## 2011-10-05 ENCOUNTER — Encounter (HOSPITAL_BASED_OUTPATIENT_CLINIC_OR_DEPARTMENT_OTHER): Payer: Self-pay | Admitting: Orthopedic Surgery

## 2011-11-05 ENCOUNTER — Encounter: Payer: Self-pay | Admitting: Internal Medicine

## 2011-11-05 ENCOUNTER — Other Ambulatory Visit (HOSPITAL_COMMUNITY): Payer: Self-pay | Admitting: Family Medicine

## 2011-11-05 DIAGNOSIS — Z1231 Encounter for screening mammogram for malignant neoplasm of breast: Secondary | ICD-10-CM

## 2011-11-05 DIAGNOSIS — Z124 Encounter for screening for malignant neoplasm of cervix: Secondary | ICD-10-CM

## 2011-11-27 ENCOUNTER — Ambulatory Visit (HOSPITAL_COMMUNITY)
Admission: RE | Admit: 2011-11-27 | Discharge: 2011-11-27 | Disposition: A | Discharge status: Home / Self Care | Payer: Medicare Other | Source: Ambulatory Visit | Attending: Family Medicine | Admitting: Family Medicine

## 2011-11-27 DIAGNOSIS — Z1231 Encounter for screening mammogram for malignant neoplasm of breast: Secondary | ICD-10-CM | POA: Insufficient documentation

## 2011-12-05 ENCOUNTER — Encounter: Payer: Self-pay | Admitting: Internal Medicine

## 2011-12-05 ENCOUNTER — Ambulatory Visit (INDEPENDENT_AMBULATORY_CARE_PROVIDER_SITE_OTHER): Payer: Medicare Other | Admitting: Internal Medicine

## 2011-12-05 VITALS — BP 122/82 | HR 56 | Ht 61.0 in | Wt 200.4 lb

## 2011-12-05 DIAGNOSIS — R141 Gas pain: Secondary | ICD-10-CM

## 2011-12-05 DIAGNOSIS — R1084 Generalized abdominal pain: Secondary | ICD-10-CM

## 2011-12-05 DIAGNOSIS — Z1211 Encounter for screening for malignant neoplasm of colon: Secondary | ICD-10-CM

## 2011-12-05 DIAGNOSIS — K5900 Constipation, unspecified: Secondary | ICD-10-CM

## 2011-12-05 MED ORDER — MOVIPREP 100 G PO SOLR: 1.0000 | Freq: Once | ORAL | Status: DC

## 2011-12-05 NOTE — Patient Instructions (Addendum)
You have been scheduled for a colonoscopy with propofol. Please follow written instructions given to you at your visit today.  Please pick up your prep kit at the pharmacy within the next 1-3 days. If you use inhalers (even only as needed), please bring them with you on the day of your procedure.  

## 2011-12-05 NOTE — Progress Notes (Signed)
HISTORY OF PRESENT ILLNESS:  [name] is a 51 y.o. female with multiple medical problems including hypertension, prior stroke, pulmonary embolus status post inferior vena cava filter, arthritis, chronic back pain, sleep apnea, chronic pain syndrome, and morbid obesity for which she has undergone Roux-en-Y gastric bypass surgery in January of 2009 at [facility]. The patient has had multiple other surgeries including, but not limited to, hysterectomy, cholecystectomy, bilateral salpingo-oophorectomy,and laparotomy with lysis of adhesions. She was last evaluated in March of 2010 for chronic pelvic pain. See that dictation. The pain was not felt to be a GI disorder. She subsequently underwent laparoscopic abdominal enteroclysis and laparoscopic appendectomy by Dr. Daphine Deutscher in June of 2010. She states that this helped her pelvic discomfort somewhat. She has since been plagued with right-sided mid and upper abdominal discomfort. She has had this for years. She also reports problems with postprandial fullness and bloating. Despite this, weight gain. She has had less frequent bowel movements. Normally 1 PM every day or 2. Currently every 4-5 days. No bleeding. She did undergo colonoscopy in 2000. This was negative. Upper endoscopy in 2005 was normal. Review of blood work from this year reveals normal CBC and basic metabolic panel. Previous imaging studies have been unremarkable.  REVIEW OF SYSTEMS:  All non-GI ROS negative except for arthritis, back pain, fatigue, headaches  Past Medical History  Diagnosis Date  . Pulmonary embolism   . Presence of inferior vena cava filter   . PFO (patent foramen ovale)   . Status post gastric bypass for obesity   . Headache     migraines  . Arthritis     osteoarthritis  . Urinary frequency   . Urinary incontinence   . Chronic back pain   . Sleep apnea     used CPAP until after bypass surg.  . Stroke 12/2002    right-sided weakness  . DVT of upper extremity (deep  vein thrombosis)   . History of shingles 07/2011  . Complication of anesthesia     states takes more than normal to put her to sleep  . Asthma     states no asthma attack since 2002  . Dental crowns present   . Dental bridge present     upper  . Fibromyalgia   . History of gallstones   . HTN (hypertension)   . History of pneumonia   . History of blood transfusion 06/2005    Past Surgical History  Procedure Date  . Bunionectomy 05/1980    both feet  . Other surgical history 12/1986    pt states that she had surgery to unclog her fallopean tubes  . Cholecystectomy 1990  . Hemorrhoid surgery 03/1993  . Uterine fibroid surgery 12/95, 7/96    x2  . Tonsillectomy 07/1995  . Heel spur surgery 08/1997    left  . Laparoscopic lysis intestinal adhesions 02/14/2000  . Shoulder surgery     bilat. - (left:  06/2005)  . Carpal tunnel release 06/21/2009    right  . Gastric roux-en-y 2009  . Cardiac catheterization 03/04/2002  . Anterior cervical decomp/discectomy fusion 02/05/2005    C5-6  . Trigger finger release 04/25/2006    decompression A-1 pulley left thumb  . Elbow surgery 08/09/2004    decompression ulnar nerve right elbow  . Enterolysis 10/22/2008    laparoscopic abd. enterolysis  . Appendectomy 10/22/2008    laparoscopic  . Nailbed repair 01/10/2005; 08/2011    exc. matrix bilat. great toe  . Abdominal hysterectomy  11/1997    complete  . Bunionectomy 08/2011    left foot  . Vena cava filter placement 2009    during Roux-en-Y surg.  . Carpal tunnel release     left hand  . Carpal tunnel release 10/03/2011    Procedure: CARPAL TUNNEL RELEASE;  Surgeon: Nicki Reaper, MD;  Location: [facility];  Service: Orthopedics;  Laterality: Right;  CARPAL TUNNEL WITH HYPOTHENAR FAT PAD TRANSFER  . Cervical spine surgery 01/2005    titanium plate implanted    Social History JAYNE PECKENPAUGH  reports that she has quit smoking. She has never used smokeless tobacco. She reports that  she does not drink alcohol or use illicit drugs.  family history includes Breast cancer in her maternal grandmother, paternal aunt, and paternal grandmother; Colon polyps in her father; Diabetes in her father; and Hypertension in her mother and unspecified family member.  Allergies  Allergen Reactions  . Oxycodone Hcl Diarrhea and Nausea And Vomiting  . Propoxyphene-Acetaminophen Diarrhea and Nausea And Vomiting  . Oxycontin (Oxycodone Hcl Er) Nausea And Vomiting  . Adhesive (Tape) Rash    PULLS SKIN OFF  . Aspirin Other (See Comments)    ESOPHAGITIS  . Prednisone Rash       PHYSICAL EXAMINATION: Vital signs: BP 122/82  Pulse 56  Ht 5\' 1"  (1.549 m)  Wt 200 lb 6 oz (90.89 kg)  BMI 37.86 kg/m2  Constitutional: obese,generally well-appearing, no acute distress Psychiatric: alert and oriented x3, cooperative Eyes: extraocular movements intact, anicteric, conjunctiva pink Mouth: oral pharynx moist, no lesions Neck: supple no lymphadenopathy Cardiovascular: heart regular rate and rhythm, no murmur Lungs: clear to auscultation bilaterally Abdomen: soft,obese, nontender, nondistended, no obvious ascites, no peritoneal signs, normal bowel sounds, no organomegaly. Surgical incisions well-healed. No hernias. Rectal:deferred until colonoscopy Extremities: no lower extremity edema bilaterally Skin: no lesions on visible extremities Neuro: No focal deficits. No asterixis.    ASSESSMENT:  #1. Chronic abdominal pain. Most likely from adhesions #2. Postprandial bloating and fullness #3. Functional constipation #4. Colon cancer screening. Appropriate candidate for followup at this time   PLAN:  #1. Discussion on chronic abdominal pain and adhesions #2. Try MiraLax to achieve bowel movements once or twice daily. Discussion on titration of this agent #3. Schedule colonoscopy to provide colon cancer screening. As well a valuate patient's complaints for her.The nature of the procedure, as  well as the risks, benefits, and alternatives were carefully and thoroughly reviewed with the patient. Ample time for discussion and questions allowed. The patient understood, was satisfied, and agreed to proceed. The patient is higher than baseline risk given her comorbidities and body habitus. CRNA monitored propofol menstruation recommended for sedation. She reports difficulty being sedated with traditional measures such as conscious sedation #4. Movi prep prescribed. The patient instructed on its use

## 2011-12-06 ENCOUNTER — Encounter: Payer: Self-pay | Admitting: Internal Medicine

## 2011-12-07 ENCOUNTER — Telehealth: Payer: Self-pay | Admitting: Internal Medicine

## 2011-12-07 NOTE — Telephone Encounter (Signed)
Dr. [name] the patient would like the coding changed to a screening.  I did explain to her that she did have some symptoms of constipation, abdominal pain, and bloating at the office visit and this is why it was not submitted as a screening. Dr [name] please advise if you would like to change this to a screening procedure?

## 2011-12-07 NOTE — Telephone Encounter (Signed)
The colonoscopy is screening as listed in #4 of my office note. Reassure her. Thanks

## 2011-12-07 NOTE — Telephone Encounter (Signed)
I have discussed with Austin Miles, [facility] Print production planner.  She will investigate further and contact the patient next week.

## 2011-12-10 ENCOUNTER — Encounter: Payer: Self-pay | Admitting: Internal Medicine

## 2011-12-10 ENCOUNTER — Ambulatory Visit (AMBULATORY_SURGERY_CENTER): Payer: Medicare Other | Admitting: Internal Medicine

## 2011-12-10 VITALS — BP 148/75 | HR 53 | Temp 96.2°F | Resp 18 | Ht 61.0 in | Wt 200.0 lb

## 2011-12-10 DIAGNOSIS — R141 Gas pain: Secondary | ICD-10-CM

## 2011-12-10 DIAGNOSIS — K5900 Constipation, unspecified: Secondary | ICD-10-CM

## 2011-12-10 DIAGNOSIS — R142 Eructation: Secondary | ICD-10-CM

## 2011-12-10 DIAGNOSIS — Z1211 Encounter for screening for malignant neoplasm of colon: Secondary | ICD-10-CM

## 2011-12-10 DIAGNOSIS — R1084 Generalized abdominal pain: Secondary | ICD-10-CM

## 2011-12-10 MED ORDER — SODIUM CHLORIDE 0.9 % IV SOLN: 500.0000 mL | INTRAVENOUS | Status: DC

## 2011-12-10 NOTE — Progress Notes (Signed)
Dr. Marina Goodell notified that B/P 181/98, pt is asymptomatic.  States she has "white coat syndrome."   Last B/P prior to discharge 169/89. Dr. Marina Goodell advised pt. To call her primary care if she becomes symptomatic.  Pt. Verbalized undertstandingPatient did not have preoperative order for IV antibiotic SSI prophylaxis. [phone]) Patient did not experience any of the following events: a burn prior to discharge; a fall within the facility; wrong site/side/patient/procedure/implant event; or a hospital transfer or hospital admission upon discharge from the facility. [phone]) Patient did not experience any of the following events: a burn prior to discharge; a fall within the facility; wrong site/side/patient/procedure/implant event; or a hospital transfer or hospital admission upon discharge from the facility. [phone])

## 2011-12-10 NOTE — Op Note (Signed)
[facility] [address] [place], [place]  [address]  COLONOSCOPY PROCEDURE REPORT  PATIENT:  [name], [name]  MR#:  [id number] BIRTHDATE:  01-Apr-1961, 50 yrs. old  GENDER:  female ENDOSCOPIST:  Wilhemina Bonito. Eda Keys, MD REF. BY:  Holley Bouche, M.D. PROCEDURE DATE:  12/10/2011 PROCEDURE:  Average-risk screening colonoscopy G0121 ASA CLASS:  Class III INDICATIONS:  Screening, Abdominal pain, constipation ; negative exam 2000 MEDICATIONS:   MAC sedation, administered by CRNA, propofol (Diprivan) 340 mg IV  DESCRIPTION OF PROCEDURE:   After the risks benefits and alternatives of the procedure were thoroughly explained, informed consent was obtained.  Digital rectal exam was performed and revealed no abnormalities.   The LB [id number] endoscope was introduced through the anus and advanced to the cecum, which was identified by both the appendix and ileocecal valve, without limitations.  The quality of the prep was excellent, using MoviPrep.  The instrument was then slowly withdrawn as the colon was fully examined. <<PROCEDUREIMAGES>>  FINDINGS:  A normal appearing cecum, ileocecal valve, and appendiceal orifice were identified. The ascending, hepatic flexure, transverse, splenic flexure, descending, sigmoid colon, and rectum appeared unremarkable.  No polyps or cancers were seen. Retroflexed views in the rectum revealed no abnormalities.    The time to cecum =  9:12  minutes. The scope was then withdrawn in 12:05  minutes from the cecum and the procedure completed.  COMPLICATIONS:  None  ENDOSCOPIC IMPRESSION: 1) Normal colon 2) No polyps or cancers  RECOMMENDATIONS: 1) Continue current colorectal screening recommendations for "routine risk" patients with a repeat colonoscopy in 10 years. 2) Miralax for bowels as previously recommended 3) Return to the care of your primary provider  ______________________________ Wilhemina Bonito. Eda Keys, MD  CC:  Johny Blamer  MD; The Patient  n. eSIGNED:   Wilhemina Bonito. Eda Keys at 12/10/2011 09:58 AM  Fernande Boyden, [phone]

## 2011-12-10 NOTE — Patient Instructions (Addendum)
YOU HAD AN ENDOSCOPIC PROCEDURE TODAY AT THE [facility]: Refer to the procedure report that was given to you for any specific questions about what was found during the examination.  If the procedure report does not answer your questions, please call your gastroenterologist to clarify.  If you requested that your care partner not be given the details of your procedure findings, then the procedure report has been included in a sealed envelope for you to review at your convenience later.  YOU SHOULD EXPECT: Some feelings of bloating in the abdomen. Passage of more gas than usual.  Walking can help get rid of the air that was put into your GI tract during the procedure and reduce the bloating. If you had a lower endoscopy (such as a colonoscopy or flexible sigmoidoscopy) you may notice spotting of blood in your stool or on the toilet paper. If you underwent a bowel prep for your procedure, then you may not have a normal bowel movement for a few days.  DIET: Your first meal following the procedure should be a light meal and then it is ok to progress to your normal diet.  A half-sandwich or bowl of soup is an example of a good first meal.  Heavy or fried foods are harder to digest and may make you feel nauseous or bloated.  Likewise meals heavy in dairy and vegetables can cause extra gas to form and this can also increase the bloating.  Drink plenty of fluids but you should avoid alcoholic beverages for 24 hours.  ACTIVITY: Your care partner should take you home directly after the procedure.  You should plan to take it easy, moving slowly for the rest of the day.  You can resume normal activity the day after the procedure however you should NOT DRIVE or use heavy machinery for 24 hours (because of the sedation medicines used during the test).    SYMPTOMS TO REPORT IMMEDIATELY: A gastroenterologist can be reached at any hour.  During normal business hours, 8:30 AM to 5:00 PM Monday through Friday,  call [phone].  After hours and on weekends, please call the GI answering service at [phone] who will take a message and have the physician on call contact you.   Following lower endoscopy (colonoscopy or flexible sigmoidoscopy):  Excessive amounts of blood in the stool  Significant tenderness or worsening of abdominal pains  Swelling of the abdomen that is new, acute  Fever of 100F or higher  Following upper endoscopy (EGD)FOLLOW UP: If any biopsies were taken you will be contacted by phone or by letter within the next 1-3 weeks.  Call your gastroenterologist if you have not heard about the biopsies in 3 weeks.  Our staff will call the home number listed on your records the next business day following your procedure to check on you and address any questions or concerns that you may have at that time regarding the information given to you following your procedure. This is a courtesy call and so if there is no answer at the home number and we have not heard from you through the emergency physician on call, we will assume that you have returned to your regular daily activities without incident.  SIGNATURES/CONFIDENTIALITY: You and/or your care partner have signed paperwork which will be entered into your electronic medical record.  These signatures attest to the fact that that the information above on your After Visit Summary has been reviewed and is understood.  Full responsibility of the  confidentiality of this discharge information lies with you and/or your care-partner.   Next colonoscopy in 10 years, 2023.   Miralax for bowels as recommended

## 2011-12-11 ENCOUNTER — Telehealth: Payer: Self-pay | Admitting: *Deleted

## 2011-12-11 NOTE — Telephone Encounter (Signed)
  Follow up Call-  Call back number 12/10/2011  Post procedure Call Back phone  # [phone]  Permission to leave phone message Yes     Patient questions:  Do you have a fever, pain , or abdominal swelling? no Pain Score  0 *  Have you tolerated food without any problems? yes  Have you been able to return to your normal activities? yes  Do you have any questions about your discharge instructions: Diet   no Medications  no Follow up visit  no  Do you have questions or concerns about your Care? no  Actions: * If pain score is 4 or above: No action needed, pain <4.

## 2011-12-18 ENCOUNTER — Other Ambulatory Visit: Payer: Medicare Other | Admitting: Lab

## 2011-12-18 ENCOUNTER — Ambulatory Visit: Payer: Medicare Other | Admitting: Nurse Practitioner

## 2011-12-18 NOTE — Addendum Note (Signed)
Addended by: Rana Snare on: 12/18/2011 03:31 PM   Modules accepted: Level of Service

## 2011-12-18 NOTE — Progress Notes (Signed)
Ms. Narang did not keep her scheduled visit today. She will be rescheduled.

## 2012-11-13 ENCOUNTER — Other Ambulatory Visit (HOSPITAL_COMMUNITY): Payer: Self-pay | Admitting: Family Medicine

## 2012-11-13 DIAGNOSIS — Z1231 Encounter for screening mammogram for malignant neoplasm of breast: Secondary | ICD-10-CM

## 2012-11-28 ENCOUNTER — Ambulatory Visit (HOSPITAL_COMMUNITY): Payer: Medicare Other

## 2012-12-10 ENCOUNTER — Ambulatory Visit (HOSPITAL_COMMUNITY)
Admission: RE | Admit: 2012-12-10 | Discharge: 2012-12-10 | Disposition: A | Discharge status: Home / Self Care | Payer: Medicare Other | Source: Ambulatory Visit | Attending: Family Medicine | Admitting: Family Medicine

## 2012-12-10 DIAGNOSIS — Z1231 Encounter for screening mammogram for malignant neoplasm of breast: Secondary | ICD-10-CM | POA: Insufficient documentation

## 2013-10-20 ENCOUNTER — Other Ambulatory Visit (HOSPITAL_COMMUNITY): Payer: Self-pay | Admitting: Family Medicine

## 2013-10-20 DIAGNOSIS — Z1231 Encounter for screening mammogram for malignant neoplasm of breast: Secondary | ICD-10-CM

## 2013-12-15 ENCOUNTER — Ambulatory Visit (HOSPITAL_COMMUNITY)
Admission: RE | Admit: 2013-12-15 | Discharge: 2013-12-15 | Disposition: A | Discharge status: Home / Self Care | Payer: Medicare Other | Source: Ambulatory Visit | Attending: Family Medicine | Admitting: Family Medicine

## 2013-12-15 ENCOUNTER — Ambulatory Visit (HOSPITAL_COMMUNITY): Payer: Medicare Other

## 2013-12-15 DIAGNOSIS — Z1231 Encounter for screening mammogram for malignant neoplasm of breast: Secondary | ICD-10-CM | POA: Diagnosis present

## 2014-02-15 ENCOUNTER — Emergency Department (HOSPITAL_BASED_OUTPATIENT_CLINIC_OR_DEPARTMENT_OTHER): Payer: Medicare Other

## 2014-02-15 ENCOUNTER — Encounter (HOSPITAL_BASED_OUTPATIENT_CLINIC_OR_DEPARTMENT_OTHER): Payer: Self-pay | Admitting: Emergency Medicine

## 2014-02-15 ENCOUNTER — Emergency Department (HOSPITAL_BASED_OUTPATIENT_CLINIC_OR_DEPARTMENT_OTHER)
Admission: EM | Admit: 2014-02-15 | Discharge: 2014-02-15 | Disposition: A | Discharge status: Home / Self Care | Payer: Medicare Other | Attending: Emergency Medicine | Admitting: Emergency Medicine

## 2014-02-15 DIAGNOSIS — R079 Chest pain, unspecified: Secondary | ICD-10-CM | POA: Insufficient documentation

## 2014-02-15 DIAGNOSIS — Q211 Atrial septal defect: Secondary | ICD-10-CM | POA: Insufficient documentation

## 2014-02-15 DIAGNOSIS — G8929 Other chronic pain: Secondary | ICD-10-CM | POA: Insufficient documentation

## 2014-02-15 DIAGNOSIS — J45909 Unspecified asthma, uncomplicated: Secondary | ICD-10-CM | POA: Diagnosis not present

## 2014-02-15 DIAGNOSIS — Z9889 Other specified postprocedural states: Secondary | ICD-10-CM | POA: Diagnosis not present

## 2014-02-15 DIAGNOSIS — Z9981 Dependence on supplemental oxygen: Secondary | ICD-10-CM | POA: Insufficient documentation

## 2014-02-15 DIAGNOSIS — Z8701 Personal history of pneumonia (recurrent): Secondary | ICD-10-CM | POA: Insufficient documentation

## 2014-02-15 DIAGNOSIS — R0789 Other chest pain: Secondary | ICD-10-CM | POA: Diagnosis not present

## 2014-02-15 DIAGNOSIS — Z86711 Personal history of pulmonary embolism: Secondary | ICD-10-CM | POA: Diagnosis not present

## 2014-02-15 DIAGNOSIS — IMO0001 Reserved for inherently not codable concepts without codable children: Secondary | ICD-10-CM | POA: Insufficient documentation

## 2014-02-15 DIAGNOSIS — M1990 Unspecified osteoarthritis, unspecified site: Secondary | ICD-10-CM | POA: Diagnosis not present

## 2014-02-15 DIAGNOSIS — Z98811 Dental restoration status: Secondary | ICD-10-CM | POA: Insufficient documentation

## 2014-02-15 DIAGNOSIS — G43909 Migraine, unspecified, not intractable, without status migrainosus: Secondary | ICD-10-CM | POA: Diagnosis not present

## 2014-02-15 DIAGNOSIS — I10 Essential (primary) hypertension: Secondary | ICD-10-CM | POA: Insufficient documentation

## 2014-02-15 DIAGNOSIS — Z87891 Personal history of nicotine dependence: Secondary | ICD-10-CM | POA: Diagnosis not present

## 2014-02-15 DIAGNOSIS — Z8619 Personal history of other infectious and parasitic diseases: Secondary | ICD-10-CM | POA: Insufficient documentation

## 2014-02-15 DIAGNOSIS — Q2111 Secundum atrial septal defect: Secondary | ICD-10-CM | POA: Insufficient documentation

## 2014-02-15 DIAGNOSIS — Z9884 Bariatric surgery status: Secondary | ICD-10-CM | POA: Diagnosis not present

## 2014-02-15 DIAGNOSIS — Z86718 Personal history of other venous thrombosis and embolism: Secondary | ICD-10-CM | POA: Diagnosis not present

## 2014-02-15 DIAGNOSIS — Z79899 Other long term (current) drug therapy: Secondary | ICD-10-CM | POA: Diagnosis not present

## 2014-02-15 DIAGNOSIS — Z8673 Personal history of transient ischemic attack (TIA), and cerebral infarction without residual deficits: Secondary | ICD-10-CM | POA: Diagnosis not present

## 2014-02-15 DIAGNOSIS — G4730 Sleep apnea, unspecified: Secondary | ICD-10-CM | POA: Insufficient documentation

## 2014-02-15 LAB — BASIC METABOLIC PANEL
Anion gap: 10 (ref 5–15)
BUN: 11 mg/dL (ref 6–23)
CO2: 28 mEq/L (ref 19–32)
Calcium: 9.3 mg/dL (ref 8.4–10.5)
Chloride: 103 mEq/L (ref 96–112)
Creatinine, Ser: 0.7 mg/dL (ref 0.50–1.10)
GFR calc Af Amer: >90 mL/min (ref >90)
GFR calc non Af Amer: >90 mL/min (ref >90)
Glucose, Bld: 69 mg/dL — ABNORMAL LOW (ref 70–99)
Potassium: 3.7 mEq/L (ref 3.7–5.3)
Sodium: 141 mEq/L (ref 137–147)

## 2014-02-15 LAB — CBC
HCT: 37.6 % (ref 36.0–46.0)
Hemoglobin: 12.3 g/dL (ref 12.0–15.0)
MCH: 30.7 pg (ref 26.0–34.0)
MCHC: 32.7 g/dL (ref 30.0–36.0)
MCV: 93.8 fL (ref 78.0–100.0)
Platelets: 192 10*3/uL (ref 150–400)
RBC: 4.01 MIL/uL (ref 3.87–5.11)
RDW: 13.8 % (ref 11.5–15.5)
WBC: 4.6 10*3/uL (ref 4.0–10.5)

## 2014-02-15 LAB — TROPONIN I: Troponin I: <0.3 ng/mL (ref <0.30)

## 2014-02-15 LAB — CBG MONITORING, ED: Glucose-Capillary: 106 mg/dL — ABNORMAL HIGH (ref 70–99)

## 2014-02-15 MED ORDER — HYDROCODONE-ACETAMINOPHEN 5-325 MG PO TABS: 1.0000 | ORAL_TABLET | ORAL | Status: DC | PRN

## 2014-02-15 MED ORDER — MORPHINE SULFATE 4 MG/ML IJ SOLN
4.0000 mg | Freq: Once | INTRAMUSCULAR | Status: AC
  Administered 2014-02-15: 4 mg via INTRAVENOUS
  Filled 2014-02-15: qty 1

## 2014-02-15 MED ORDER — IOHEXOL 350 MG/ML SOLN
100.0000 mL | Freq: Once | INTRAVENOUS | Status: AC | PRN
  Administered 2014-02-15: 100 mL via INTRAVENOUS

## 2014-02-15 MED ORDER — ONDANSETRON HCL 4 MG/2ML IJ SOLN
4.0000 mg | Freq: Once | INTRAMUSCULAR | Status: AC
  Administered 2014-02-15: 4 mg via INTRAVENOUS
  Filled 2014-02-15: qty 2

## 2014-02-15 NOTE — Discharge Instructions (Signed)
Return to the ED with any concerns including difficulty breathing, fainting, leg swelling, worsening chest pain, decreased level of alertness/lethargy, or any other alarming symptoms °

## 2014-02-15 NOTE — ED Provider Notes (Signed)
CSN: [id number]     Arrival date & time 02/15/14  1250 History   First MD Initiated Contact with Patient 02/15/14 1316     Chief Complaint  Patient presents with  . Chest Pain     (Consider location/radiation/quality/duration/timing/severity/associated sxs/prior Treatment) HPI Pt presenting with c/o three days of constant chest pain.  She states the chest pain began in the midsternal region- over the 3 days the pain has spread to her left breast.  No shortness of breath.  No raidation of pain.  No diaphoresis or nausea.  Pain is not associated with exertion.  No leg swelling.  Pt has hx of PE, with IVC filter in place, not on anticoagulants since 2009.  Pain has been constant and sharp.  There are no other associated systemic symptoms, there are no other alleviating or modifying factors.   Past Medical History  Diagnosis Date  . Pulmonary embolism   . Presence of inferior vena cava filter   . PFO (patent foramen ovale)   . Status post gastric bypass for obesity   . Headache(784.0)     migraines  . Arthritis     osteoarthritis  . Urinary frequency   . Urinary incontinence   . Chronic back pain   . Sleep apnea     used CPAP until after bypass surg.  . Stroke 12/2002    right-sided weakness  . DVT of upper extremity (deep vein thrombosis)   . History of shingles 07/2011  . Complication of anesthesia     states takes more than normal to put her to sleep  . Asthma     states no asthma attack since 2002  . Dental crowns present   . Dental bridge present     upper  . Fibromyalgia   . History of gallstones   . HTN (hypertension)   . History of pneumonia   . History of blood transfusion 06/2005   Past Surgical History  Procedure Laterality Date  . Bunionectomy  05/1980    both feet  . Other surgical history  12/1986    pt states that she had surgery to unclog her fallopean tubes  . Cholecystectomy  1990  . Hemorrhoid surgery  03/1993  . Uterine fibroid surgery  12/95, 7/96     x2  . Tonsillectomy  07/1995  . Heel spur surgery  08/1997    left  . Laparoscopic lysis intestinal adhesions  02/14/2000  . Shoulder surgery      bilat. - (left:  06/2005)  . Carpal tunnel release  06/21/2009    right  . Gastric roux-en-y  2009  . Cardiac catheterization  03/04/2002  . Anterior cervical decomp/discectomy fusion  02/05/2005    C5-6  . Trigger finger release  04/25/2006    decompression A-1 pulley left thumb  . Elbow surgery  08/09/2004    decompression ulnar nerve right elbow  . Enterolysis  10/22/2008    laparoscopic abd. enterolysis  . Appendectomy  10/22/2008    laparoscopic  . Nailbed repair  01/10/2005; 08/2011    exc. matrix bilat. great toe  . Abdominal hysterectomy  11/1997    complete  . Bunionectomy  08/2011    left foot  . Vena cava filter placement  2009    during Roux-en-Y surg.  . Carpal tunnel release      left hand  . Carpal tunnel release  10/03/2011    Procedure: CARPAL TUNNEL RELEASE;  Surgeon: Wynonia Sours, MD;  Location: [facility]  [place];  Service: Orthopedics;  Laterality: Right;  CARPAL TUNNEL WITH HYPOTHENAR FAT PAD TRANSFER  . Cervical spine surgery  01/2005    titanium plate implanted   Family History  Problem Relation Age of Onset  . Breast cancer Paternal Aunt   . Breast cancer Paternal Grandmother   . Breast cancer Maternal Grandmother   . Colon polyps Father   . Diabetes Father     borderline  . Hypertension Mother   . Hypertension     History  Substance Use Topics  . Smoking status: Former Research scientist (life sciences)  . Smokeless tobacco: Never Used     Comment: quit smoking 08/1989  . Alcohol Use: No   OB History   Grav Para Term Preterm Abortions TAB SAB Ect Mult Living                 Review of Systems ROS reviewed and all otherwise negative except for mentioned in HPI    Allergies  Oxycodone hcl; Propoxyphene n-acetaminophen; Oxycontin; Adhesive; Aspirin; and Prednisone  Home Medications   Prior to Admission medications    Medication Sig Start Date End Date Taking? Authorizing Provider  albuterol (PROVENTIL HFA;VENTOLIN HFA) 108 (90 BASE) MCG/ACT inhaler Inhale 2 puffs into the lungs every 6 (six) hours as needed. Shortness of breath and wheezing     Historical Provider, MD  baclofen (LIORESAL) 20 MG tablet Take 20 mg by mouth daily.      Historical Provider, MD  calcium citrate (CALCITRATE - DOSED IN MG ELEMENTAL CALCIUM) 950 MG tablet Take 500 mg by mouth 2 (two) times daily. CHEWABLE    Historical Provider, MD  Camphor-Menthol-Methyl Sal (SALONPAS) 1.2-5.7-6.3 % PTCH Apply 1 patch topically every 8 (eight) hours as needed. For pain    Historical Provider, MD  cyanocobalamin 1000 MCG tablet Place 1,500 mcg under the tongue daily.     Historical Provider, MD  cyclobenzaprine (FLEXERIL) 5 MG tablet Take 5 mg by mouth every 8 (eight) hours as needed.    Historical Provider, MD  HYDROcodone-acetaminophen (NORCO/VICODIN) 5-325 MG per tablet Take 1 tablet by mouth every 4 (four) hours as needed. 02/15/14   Threasa Beards, MD  MOVIPREP 100 G SOLR Take 1 kit (100 g total) by mouth once. 12/05/11   Irene Shipper, MD  Multiple Vitamins-Minerals ([facility] SPECTRAVITE) CHEW Chew 1 tablet by mouth daily.    Historical Provider, MD  solifenacin (VESICARE) 5 MG tablet Take 5 mg by mouth daily. AM    Historical Provider, MD  traMADol (ULTRAM) 50 MG tablet Take 50 mg by mouth every 6 (six) hours as needed.    Historical Provider, MD  valACYclovir (VALTREX) 500 MG tablet Take 500 mg by mouth daily. AM    Historical Provider, MD   BP 124/56  Pulse 64  Temp(Src) 98.5 F (36.9 C) (Oral)  Resp 18  SpO2 100% Vitals reviewed Physical Exam Physical Examination: General appearance - alert, well appearing, and in no distress Mental status - alert, oriented to person, place, and time Eyes - no conjunctival injection, no scleral icterus Mouth - mucous membranes moist, pharynx normal without lesions Chest - clear to auscultation, no wheezes,  rales or rhonchi, symmetric air entry, ttp over midsternal chest and left breast tenderness Heart - normal rate, regular rhythm, normal S1, S2, no murmurs, rubs, clicks or gallops Abdomen - soft, nontender, nondistended, no masses or organomegaly Extremities - peripheral pulses normal, no pedal edema, no clubbing or cyanosis Skin - normal coloration and  turgor, no rashes  ED Course  Procedures (including critical care time) Labs Review Labs Reviewed  BASIC METABOLIC PANEL - Abnormal; Notable for the following:    Glucose, Bld 69 (*)    All other components within normal limits  CBG MONITORING, ED - Abnormal; Notable for the following:    Glucose-Capillary 106 (*)    All other components within normal limits  CBC  TROPONIN I    Imaging Review Ct Angio Chest Pe W/cm &/or Wo Cm  02/15/2014   CLINICAL DATA:  Left-sided chest pain.  EXAM: CT ANGIOGRAPHY CHEST WITH CONTRAST  TECHNIQUE: Multidetector CT imaging of the chest was performed using the standard protocol during bolus administration of intravenous contrast. Multiplanar CT image reconstructions and MIPs were obtained to evaluate the vascular anatomy.  CONTRAST:  113m OMNIPAQUE IOHEXOL 350 MG/ML SOLN  COMPARISON:  02/17/2011.  FINDINGS: No filling defects in the pulmonary arteries to suggest pulmonary emboli. Heart is normal size. Aorta is normal caliber. No mediastinal, hilar, or axillary adenopathy. Chest wall soft tissues are unremarkable.  Minimal ground-glass densities dependently, likely dependent atelectasis. No confluent opacities, nodules or effusions. Imaging into the upper abdomen shows no acute findings. Postoperative changes in the region of the stomach.  No acute bony abnormality.  Review of the MIP images confirms the above findings.  IMPRESSION: No acute findings.  No evidence of pulmonary embolus.   Electronically Signed   By: KRolm BaptiseM.D.   On: 02/15/2014 15:05     EKG Interpretation   Date/Time:  Monday February 15 2014 13:01:59 EDT Ventricular Rate:  54 PR Interval:  160 QRS Duration: 86 QT Interval:  450 QTC Calculation: 426 R Axis:   30 Text Interpretation:  Sinus bradycardia Nonspecific T wave abnormality  Abnormal ECG No significant change since last tracing Confirmed by [facility]  MD, Clanton Emanuelson ([phone] on 02/15/2014 2:45:10 PM      MDM   Final diagnoses:  Other chest pain    Pt presenting with c/o chest pain.  Low suspicion for ACS and initial troponin is negative after 3 days fo constant pain in chest which rules out cardiac damage.  No PE on CT scan.  Discussed results with patient, she will be discharged to arrange for outpatient followup with her doctor.  Discharged with strict return precautions.  Pt agreeable with plan.    MThreasa Beards MD 02/16/14 [phone]

## 2014-02-15 NOTE — ED Notes (Signed)
Patient reports three days of left chest pain that has worsened over that time but is not associated with any other symptoms. Pt has an IVC filter and is not on anti-coagulation. Pt denies SOB. Arrives to ED in private vehicle in NAD, VSS.

## 2014-04-22 ENCOUNTER — Other Ambulatory Visit (INDEPENDENT_AMBULATORY_CARE_PROVIDER_SITE_OTHER): Payer: Self-pay

## 2014-04-22 DIAGNOSIS — R109 Unspecified abdominal pain: Secondary | ICD-10-CM

## 2014-04-28 ENCOUNTER — Other Ambulatory Visit: Payer: Medicare Other

## 2014-05-18 ENCOUNTER — Emergency Department (HOSPITAL_BASED_OUTPATIENT_CLINIC_OR_DEPARTMENT_OTHER): Payer: Medicare Other

## 2014-05-18 ENCOUNTER — Encounter (HOSPITAL_BASED_OUTPATIENT_CLINIC_OR_DEPARTMENT_OTHER): Payer: Self-pay | Admitting: *Deleted

## 2014-05-18 ENCOUNTER — Emergency Department (HOSPITAL_BASED_OUTPATIENT_CLINIC_OR_DEPARTMENT_OTHER)
Admission: EM | Admit: 2014-05-18 | Discharge: 2014-05-18 | Disposition: A | Discharge status: Home / Self Care | Payer: Medicare Other | Attending: Emergency Medicine | Admitting: Emergency Medicine

## 2014-05-18 DIAGNOSIS — Z8701 Personal history of pneumonia (recurrent): Secondary | ICD-10-CM | POA: Insufficient documentation

## 2014-05-18 DIAGNOSIS — Q211 Atrial septal defect: Secondary | ICD-10-CM | POA: Diagnosis not present

## 2014-05-18 DIAGNOSIS — Z9884 Bariatric surgery status: Secondary | ICD-10-CM | POA: Insufficient documentation

## 2014-05-18 DIAGNOSIS — Z98811 Dental restoration status: Secondary | ICD-10-CM | POA: Insufficient documentation

## 2014-05-18 DIAGNOSIS — Z972 Presence of dental prosthetic device (complete) (partial): Secondary | ICD-10-CM | POA: Insufficient documentation

## 2014-05-18 DIAGNOSIS — M1990 Unspecified osteoarthritis, unspecified site: Secondary | ICD-10-CM | POA: Diagnosis not present

## 2014-05-18 DIAGNOSIS — Z9981 Dependence on supplemental oxygen: Secondary | ICD-10-CM | POA: Insufficient documentation

## 2014-05-18 DIAGNOSIS — R05 Cough: Secondary | ICD-10-CM | POA: Diagnosis present

## 2014-05-18 DIAGNOSIS — Z86711 Personal history of pulmonary embolism: Secondary | ICD-10-CM | POA: Diagnosis not present

## 2014-05-18 DIAGNOSIS — Z8673 Personal history of transient ischemic attack (TIA), and cerebral infarction without residual deficits: Secondary | ICD-10-CM | POA: Insufficient documentation

## 2014-05-18 DIAGNOSIS — Z86718 Personal history of other venous thrombosis and embolism: Secondary | ICD-10-CM | POA: Insufficient documentation

## 2014-05-18 DIAGNOSIS — G4730 Sleep apnea, unspecified: Secondary | ICD-10-CM | POA: Insufficient documentation

## 2014-05-18 DIAGNOSIS — Z79899 Other long term (current) drug therapy: Secondary | ICD-10-CM | POA: Diagnosis not present

## 2014-05-18 DIAGNOSIS — Z8619 Personal history of other infectious and parasitic diseases: Secondary | ICD-10-CM | POA: Diagnosis not present

## 2014-05-18 DIAGNOSIS — R059 Cough, unspecified: Secondary | ICD-10-CM

## 2014-05-18 DIAGNOSIS — Z87891 Personal history of nicotine dependence: Secondary | ICD-10-CM | POA: Insufficient documentation

## 2014-05-18 DIAGNOSIS — R0789 Other chest pain: Secondary | ICD-10-CM

## 2014-05-18 DIAGNOSIS — J45901 Unspecified asthma with (acute) exacerbation: Secondary | ICD-10-CM | POA: Insufficient documentation

## 2014-05-18 DIAGNOSIS — Z9889 Other specified postprocedural states: Secondary | ICD-10-CM | POA: Insufficient documentation

## 2014-05-18 DIAGNOSIS — I10 Essential (primary) hypertension: Secondary | ICD-10-CM | POA: Diagnosis not present

## 2014-05-18 DIAGNOSIS — G8929 Other chronic pain: Secondary | ICD-10-CM | POA: Diagnosis not present

## 2014-05-18 DIAGNOSIS — J40 Bronchitis, not specified as acute or chronic: Secondary | ICD-10-CM

## 2014-05-18 LAB — CBC WITH DIFFERENTIAL/PLATELET
Basophils Absolute: 0 10*3/uL (ref 0.0–0.1)
Basophils Relative: 0 % (ref 0–1)
Eosinophils Absolute: 0.1 10*3/uL (ref 0.0–0.7)
Eosinophils Relative: 2 % (ref 0–5)
HCT: 39.1 % (ref 36.0–46.0)
Hemoglobin: 12.9 g/dL (ref 12.0–15.0)
Lymphocytes Relative: 39 % (ref 12–46)
Lymphs Abs: 2.4 10*3/uL (ref 0.7–4.0)
MCH: 30.9 pg (ref 26.0–34.0)
MCHC: 33 g/dL (ref 30.0–36.0)
MCV: 93.5 fL (ref 78.0–100.0)
Monocytes Absolute: 0.5 10*3/uL (ref 0.1–1.0)
Monocytes Relative: 9 % (ref 3–12)
Neutro Abs: 3 10*3/uL (ref 1.7–7.7)
Neutrophils Relative %: 50 % (ref 43–77)
Platelets: 221 10*3/uL (ref 150–400)
RBC: 4.18 MIL/uL (ref 3.87–5.11)
RDW: 13.6 % (ref 11.5–15.5)
WBC: 6 10*3/uL (ref 4.0–10.5)

## 2014-05-18 LAB — BASIC METABOLIC PANEL
Anion gap: 7 (ref 5–15)
BUN: 14 mg/dL (ref 6–23)
CO2: 30 mmol/L (ref 19–32)
Calcium: 9.4 mg/dL (ref 8.4–10.5)
Chloride: 101 mEq/L (ref 96–112)
Creatinine, Ser: 0.7 mg/dL (ref 0.50–1.10)
GFR calc Af Amer: >90 mL/min (ref >90)
GFR calc non Af Amer: >90 mL/min (ref >90)
Glucose, Bld: 112 mg/dL — ABNORMAL HIGH (ref 70–99)
Potassium: 3.7 mmol/L (ref 3.5–5.1)
Sodium: 138 mmol/L (ref 135–145)

## 2014-05-18 LAB — TROPONIN I: Troponin I: <0.03 ng/mL (ref <0.031)

## 2014-05-18 MED ORDER — GUAIFENESIN-CODEINE 100-10 MG/5ML PO SOLN: 5.0000 mL | Freq: Four times a day (QID) | ORAL | Status: DC | PRN

## 2014-05-18 MED ORDER — ALBUTEROL SULFATE HFA 108 (90 BASE) MCG/ACT IN AERS
2.0000 | INHALATION_SPRAY | Freq: Once | RESPIRATORY_TRACT | Status: AC
  Administered 2014-05-18: 2 via RESPIRATORY_TRACT
  Filled 2014-05-18: qty 6.7

## 2014-05-18 MED ORDER — GUAIFENESIN-CODEINE 100-10 MG/5ML PO SOLN
5.0000 mL | Freq: Four times a day (QID) | ORAL | Status: DC | PRN
Start: 2014-05-18 — End: 2014-05-18

## 2014-05-18 NOTE — ED Provider Notes (Signed)
CSN: [id number]     Arrival date & time 05/18/14  1317 History   First MD Initiated Contact with Patient 05/18/14 1443     Chief Complaint  Patient presents with  . URI     (Consider location/radiation/quality/duration/timing/severity/associated sxs/prior Treatment) Patient is a 53 y.o. female presenting with URI. The history is provided by the patient. No language interpreter was used.  URI Presenting symptoms: congestion, cough, fatigue and fever   Presenting symptoms: no rhinorrhea and no sore throat   Congestion:    Location:  Nasal and chest   Interferes with sleep: no     Interferes with eating/drinking: no   Cough:    Cough characteristics:  Productive   Sputum characteristics:  Nondescript   Severity:  Mild   Duration:  2 weeks   Timing:  Constant   Progression:  Unchanged   Chronicity:  New Fever:    Duration:  2 weeks   Timing:  Sporadic   Temp source:  Subjective   Progression:  Waxing and waning Associated symptoms: myalgias and wheezing   Associated symptoms: no arthralgias, no headaches, no neck pain, no sinus pain, no sneezing and no swollen glands   Risk factors: no chronic cardiac disease, no chronic kidney disease, no chronic respiratory disease, no diabetes mellitus, no immunosuppression, no recent illness, no recent travel and no sick contacts     Past Medical History  Diagnosis Date  . Pulmonary embolism   . Presence of inferior vena cava filter   . PFO (patent foramen ovale)   . Status post gastric bypass for obesity   . Headache(784.0)     migraines  . Arthritis     osteoarthritis  . Urinary frequency   . Urinary incontinence   . Chronic back pain   . Sleep apnea     used CPAP until after bypass surg.  . Stroke 12/2002    right-sided weakness  . DVT of upper extremity (deep vein thrombosis)   . History of shingles 07/2011  . Complication of anesthesia     states takes more than normal to put her to sleep  . Asthma     states no asthma  attack since 2002  . Dental crowns present   . Dental bridge present     upper  . Fibromyalgia   . History of gallstones   . HTN (hypertension)   . History of pneumonia   . History of blood transfusion 06/2005   Past Surgical History  Procedure Laterality Date  . Bunionectomy  05/1980    both feet  . Other surgical history  12/1986    pt states that she had surgery to unclog her fallopean tubes  . Cholecystectomy  1990  . Hemorrhoid surgery  03/1993  . Uterine fibroid surgery  12/95, 7/96    x2  . Tonsillectomy  07/1995  . Heel spur surgery  08/1997    left  . Laparoscopic lysis intestinal adhesions  02/14/2000  . Shoulder surgery      bilat. - (left:  06/2005)  . Carpal tunnel release  06/21/2009    right  . Gastric roux-en-y  2009  . Cardiac catheterization  03/04/2002  . Anterior cervical decomp/discectomy fusion  02/05/2005    C5-6  . Trigger finger release  04/25/2006    decompression A-1 pulley left thumb  . Elbow surgery  08/09/2004    decompression ulnar nerve right elbow  . Enterolysis  10/22/2008    laparoscopic abd. enterolysis  .  Appendectomy  10/22/2008    laparoscopic  . Nailbed repair  01/10/2005; 08/2011    exc. matrix bilat. great toe  . Abdominal hysterectomy  11/1997    complete  . Bunionectomy  08/2011    left foot  . Vena cava filter placement  2009    during Roux-en-Y surg.  . Carpal tunnel release      left hand  . Carpal tunnel release  10/03/2011    Procedure: CARPAL TUNNEL RELEASE;  Surgeon: Wynonia Sours, MD;  Location: [facility];  Service: Orthopedics;  Laterality: Right;  CARPAL TUNNEL WITH HYPOTHENAR FAT PAD TRANSFER  . Cervical spine surgery  01/2005    titanium plate implanted   Family History  Problem Relation Age of Onset  . Breast cancer Paternal Aunt   . Breast cancer Paternal Grandmother   . Breast cancer Maternal Grandmother   . Colon polyps Father   . Diabetes Father     borderline  . Hypertension Mother   . Hypertension      History  Substance Use Topics  . Smoking status: Former Research scientist (life sciences)  . Smokeless tobacco: Never Used     Comment: quit smoking 08/1989  . Alcohol Use: No   OB History    No data available     Review of Systems  Constitutional: Positive for fever and fatigue. Negative for chills, diaphoresis, activity change and appetite change.  HENT: Positive for congestion. Negative for facial swelling, rhinorrhea, sneezing and sore throat.   Eyes: Negative for photophobia and discharge.  Respiratory: Positive for cough and wheezing. Negative for chest tightness and shortness of breath.   Cardiovascular: Negative for chest pain, palpitations and leg swelling.  Gastrointestinal: Negative for nausea, vomiting, abdominal pain and diarrhea.  Endocrine: Negative for polydipsia and polyuria.  Genitourinary: Negative for dysuria, frequency, difficulty urinating and pelvic pain.  Musculoskeletal: Positive for myalgias. Negative for back pain, arthralgias, neck pain and neck stiffness.  Skin: Negative for color change and wound.  Allergic/Immunologic: Negative for immunocompromised state.  Neurological: Negative for facial asymmetry, weakness, numbness and headaches.  Hematological: Does not bruise/bleed easily.  Psychiatric/Behavioral: Negative for confusion and agitation.      Allergies  Oxycodone hcl; Propoxyphene n-acetaminophen; Oxycontin; Adhesive; Aspirin; and Prednisone  Home Medications   Prior to Admission medications   Medication Sig Start Date End Date Taking? Authorizing Provider  albuterol (PROVENTIL HFA;VENTOLIN HFA) 108 (90 BASE) MCG/ACT inhaler Inhale 2 puffs into the lungs every 6 (six) hours as needed. Shortness of breath and wheezing     Historical Provider, MD  baclofen (LIORESAL) 20 MG tablet Take 20 mg by mouth daily.      Historical Provider, MD  calcium citrate (CALCITRATE - DOSED IN MG ELEMENTAL CALCIUM) 950 MG tablet Take 500 mg by mouth 2 (two) times daily. CHEWABLE     Historical Provider, MD  Camphor-Menthol-Methyl Sal (SALONPAS) 1.2-5.7-6.3 % PTCH Apply 1 patch topically every 8 (eight) hours as needed. For pain    Historical Provider, MD  cyanocobalamin 1000 MCG tablet Place 1,500 mcg under the tongue daily.     Historical Provider, MD  cyclobenzaprine (FLEXERIL) 5 MG tablet Take 5 mg by mouth every 8 (eight) hours as needed.    Historical Provider, MD  guaiFENesin-codeine 100-10 MG/5ML syrup Take 5 mLs by mouth every 6 (six) hours as needed for cough. 05/18/14   Ernestina Patches, MD  HYDROcodone-acetaminophen (NORCO/VICODIN) 5-325 MG per tablet Take 1 tablet by mouth every 4 (four) hours as needed.  02/15/14   Threasa Beards, MD  MOVIPREP 100 G SOLR Take 1 kit (100 g total) by mouth once. 12/05/11   Irene Shipper, MD  Multiple Vitamins-Minerals ([employer] SPECTRAVITE) CHEW Chew 1 tablet by mouth daily.    Historical Provider, MD  solifenacin (VESICARE) 5 MG tablet Take 5 mg by mouth daily. AM    Historical Provider, MD  traMADol (ULTRAM) 50 MG tablet Take 50 mg by mouth every 6 (six) hours as needed.    Historical Provider, MD  valACYclovir (VALTREX) 500 MG tablet Take 500 mg by mouth daily. AM    Historical Provider, MD   BP 150/100 mmHg  Pulse 74  Temp(Src) 99.1 F (37.3 C)  Resp 18  Ht _0  (1.549 m)  Wt 215 lb (97.523 kg)  BMI 40.64 kg/m2  SpO2 100% Physical Exam  Constitutional: She is oriented to person, place, and time. She appears well-developed and well-nourished. No distress.  HENT:  Head: Normocephalic and atraumatic.  Mouth/Throat: No oropharyngeal exudate.  Eyes: Pupils are equal, round, and reactive to light.  Neck: Normal range of motion. Neck supple.  Cardiovascular: Normal rate, regular rhythm and normal heart sounds.  Exam reveals no gallop and no friction rub.   No murmur heard. Pulmonary/Chest: Effort normal and breath sounds normal. No respiratory distress. She has no wheezes. She has no rales.  Abdominal: Soft. Bowel sounds are  normal. She exhibits no distension and no mass. There is no tenderness. There is no rebound and no guarding.  Musculoskeletal: Normal range of motion. She exhibits no edema or tenderness.  Neurological: She is alert and oriented to person, place, and time.  Skin: Skin is warm and dry.  Psychiatric: She has a normal mood and affect.    ED Course  Procedures (including critical care time) Labs Review Labs Reviewed  BASIC METABOLIC PANEL - Abnormal; Notable for the following:    Glucose, Bld 112 (*)    All other components within normal limits  CBC WITH DIFFERENTIAL  TROPONIN I    Imaging Review Dg Chest 2 View  05/18/2014   CLINICAL DATA:  Chest tightness, congestion, shortness of breath and wheezing.  EXAM: CHEST - 2 VIEW  COMPARISON:  02/16/2011  FINDINGS: The heart size and mediastinal contours are within normal limits. Suggestion of mild bronchial thickening, especially in the right lower lung zone. No focal infiltrate is identified. No evidence of edema, nodule, pneumothorax or pleural fluid. The visualized skeletal structures are unremarkable.  IMPRESSION: Suggestion of bronchial thickening, especially in the right lower lung.   Electronically Signed   By: Aletta Edouard M.D.   On: 05/18/2014 15:21     EKG Interpretation   Date/Time:  Tuesday May 18 2014 15:36:19 EST Ventricular Rate:  51 PR Interval:  162 QRS Duration: 74 QT Interval:  496 QTC Calculation: 457 R Axis:   33 Text Interpretation:  Sinus bradycardia Otherwise normal ECG Borderline T  abnormalities in lateral leads, otherwise unchanged from prior Confirmed  by Conlan Miceli  MD, Cristel Rail [phone]) on 05/18/2014 3:49:30 PM      MDM   Final diagnoses:  Cough  Chest tightness  Bronchitis    Pt is a 53 y.o. female with Pmhx as above who presents with about 2 weeks of congestion, myalgias, fever, chills, SOB/wheezing, and a midsternal chest tightness for 2 days. On PE, pt has low grade temp. Cardiopulm exam  benign.    CXR w/ bronchitis. Troponin is not elevated.  EKG has no acute  ischemic changes. Spoke with patient about use of prednisone, which she says gives her red itchy rash or has made her develop shingles.  Patient will be given albuterol MDI and cough medicine with codeine for continued symptomatic treatment.  She is also been instructed to take antipyretics and increase by mouth hydration. I do not feel that antibiotics would be helpful as bronchitis is likely viral in nature.  This been discussed with the patient     ADALINA DOPSON evaluation in the Emergency Department is complete. It has been determined that no acute conditions requiring further emergency intervention are present at this time. The patient/guardian have been advised of the diagnosis and plan. We have discussed signs and symptoms that warrant return to the ED, such as changes or worsening in symptoms, worsening pain, worsening shortness of breath      Ernestina Patches, MD 05/18/14 1617

## 2014-05-18 NOTE — ED Notes (Signed)
Pt c/o URI symtpoms x 2 weeks

## 2014-05-18 NOTE — Discharge Instructions (Signed)

## 2014-06-07 ENCOUNTER — Encounter (HOSPITAL_BASED_OUTPATIENT_CLINIC_OR_DEPARTMENT_OTHER): Payer: Self-pay | Admitting: *Deleted

## 2014-06-07 ENCOUNTER — Emergency Department (HOSPITAL_BASED_OUTPATIENT_CLINIC_OR_DEPARTMENT_OTHER): Payer: Medicare Other

## 2014-06-07 ENCOUNTER — Emergency Department (HOSPITAL_BASED_OUTPATIENT_CLINIC_OR_DEPARTMENT_OTHER)
Admission: EM | Admit: 2014-06-07 | Discharge: 2014-06-07 | Disposition: A | Discharge status: Home / Self Care | Payer: Medicare Other | Attending: Emergency Medicine | Admitting: Emergency Medicine

## 2014-06-07 DIAGNOSIS — J45901 Unspecified asthma with (acute) exacerbation: Secondary | ICD-10-CM | POA: Insufficient documentation

## 2014-06-07 DIAGNOSIS — Z86718 Personal history of other venous thrombosis and embolism: Secondary | ICD-10-CM | POA: Insufficient documentation

## 2014-06-07 DIAGNOSIS — R509 Fever, unspecified: Secondary | ICD-10-CM | POA: Diagnosis present

## 2014-06-07 DIAGNOSIS — Z8701 Personal history of pneumonia (recurrent): Secondary | ICD-10-CM | POA: Insufficient documentation

## 2014-06-07 DIAGNOSIS — Z79899 Other long term (current) drug therapy: Secondary | ICD-10-CM | POA: Insufficient documentation

## 2014-06-07 DIAGNOSIS — Z8619 Personal history of other infectious and parasitic diseases: Secondary | ICD-10-CM | POA: Insufficient documentation

## 2014-06-07 DIAGNOSIS — R059 Cough, unspecified: Secondary | ICD-10-CM

## 2014-06-07 DIAGNOSIS — Z86711 Personal history of pulmonary embolism: Secondary | ICD-10-CM | POA: Insufficient documentation

## 2014-06-07 DIAGNOSIS — G8929 Other chronic pain: Secondary | ICD-10-CM | POA: Diagnosis not present

## 2014-06-07 DIAGNOSIS — R05 Cough: Secondary | ICD-10-CM

## 2014-06-07 DIAGNOSIS — J069 Acute upper respiratory infection, unspecified: Secondary | ICD-10-CM

## 2014-06-07 DIAGNOSIS — Z791 Long term (current) use of non-steroidal anti-inflammatories (NSAID): Secondary | ICD-10-CM | POA: Diagnosis not present

## 2014-06-07 DIAGNOSIS — R0602 Shortness of breath: Secondary | ICD-10-CM

## 2014-06-07 DIAGNOSIS — Q211 Atrial septal defect: Secondary | ICD-10-CM | POA: Insufficient documentation

## 2014-06-07 DIAGNOSIS — Z8673 Personal history of transient ischemic attack (TIA), and cerebral infarction without residual deficits: Secondary | ICD-10-CM | POA: Diagnosis not present

## 2014-06-07 DIAGNOSIS — M1990 Unspecified osteoarthritis, unspecified site: Secondary | ICD-10-CM | POA: Insufficient documentation

## 2014-06-07 DIAGNOSIS — G4730 Sleep apnea, unspecified: Secondary | ICD-10-CM | POA: Insufficient documentation

## 2014-06-07 DIAGNOSIS — Z9981 Dependence on supplemental oxygen: Secondary | ICD-10-CM | POA: Insufficient documentation

## 2014-06-07 DIAGNOSIS — I10 Essential (primary) hypertension: Secondary | ICD-10-CM | POA: Diagnosis not present

## 2014-06-07 LAB — CBC WITH DIFFERENTIAL/PLATELET
Basophils Absolute: 0 10*3/uL (ref 0.0–0.1)
Basophils Relative: 1 % (ref 0–1)
Eosinophils Absolute: 0.1 10*3/uL (ref 0.0–0.7)
Eosinophils Relative: 2 % (ref 0–5)
HCT: 38 % (ref 36.0–46.0)
Hemoglobin: 12.5 g/dL (ref 12.0–15.0)
Lymphocytes Relative: 41 % (ref 12–46)
Lymphs Abs: 1.8 10*3/uL (ref 0.7–4.0)
MCH: 30.4 pg (ref 26.0–34.0)
MCHC: 32.9 g/dL (ref 30.0–36.0)
MCV: 92.5 fL (ref 78.0–100.0)
Monocytes Absolute: 0.4 10*3/uL (ref 0.1–1.0)
Monocytes Relative: 9 % (ref 3–12)
Neutro Abs: 2.1 10*3/uL (ref 1.7–7.7)
Neutrophils Relative %: 47 % (ref 43–77)
Platelets: 183 10*3/uL (ref 150–400)
RBC: 4.11 MIL/uL (ref 3.87–5.11)
RDW: 13 % (ref 11.5–15.5)
WBC: 4.4 10*3/uL (ref 4.0–10.5)

## 2014-06-07 LAB — COMPREHENSIVE METABOLIC PANEL
ALT: 16 U/L (ref 0–35)
AST: 29 U/L (ref 0–37)
Albumin: 4.6 g/dL (ref 3.5–5.2)
Alkaline Phosphatase: 71 U/L (ref 39–117)
Anion gap: 6 (ref 5–15)
BUN: 12 mg/dL (ref 6–23)
CO2: 29 mmol/L (ref 19–32)
Calcium: 9.2 mg/dL (ref 8.4–10.5)
Chloride: 101 mEq/L (ref 96–112)
Creatinine, Ser: 0.68 mg/dL (ref 0.50–1.10)
GFR calc Af Amer: >90 mL/min (ref >90)
GFR calc non Af Amer: >90 mL/min (ref >90)
Glucose, Bld: 97 mg/dL (ref 70–99)
Potassium: 3.9 mmol/L (ref 3.5–5.1)
Sodium: 136 mmol/L (ref 135–145)
Total Bilirubin: 0.7 mg/dL (ref 0.3–1.2)
Total Protein: 8 g/dL (ref 6.0–8.3)

## 2014-06-07 LAB — I-STAT TROPONIN, ED: Troponin i, poc: 0.01 ng/mL (ref 0.00–0.08)

## 2014-06-07 LAB — D-DIMER, QUANTITATIVE: D-Dimer, Quant: 0.73 ug/mL-FEU — ABNORMAL HIGH (ref 0.00–0.48)

## 2014-06-07 MED ORDER — IOHEXOL 350 MG/ML SOLN
100.0000 mL | Freq: Once | INTRAVENOUS | Status: AC | PRN
  Administered 2014-06-07: 100 mL via INTRAVENOUS

## 2014-06-07 NOTE — ED Notes (Signed)
Awaiting MD to insert IV via ultrasound as pt refused for RN to insert via same route.

## 2014-06-07 NOTE — ED Notes (Signed)
Pts IV blew in CT. Pt refusing to let Scott RN insert another IV. MD Wofford made aware. MD to insert IV with ultrasound guided.

## 2014-06-07 NOTE — Discharge Instructions (Signed)
Cough, Adult ° A cough is a reflex that helps clear your throat and airways. It can help heal the body or may be a reaction to an irritated airway. A cough may only last 2 or 3 weeks (acute) or may last more than 8 weeks (chronic).  °CAUSES °Acute cough: °· Viral or bacterial infections. °Chronic cough: °· Infections. °· Allergies. °· Asthma. °· Post-nasal drip. °· Smoking. °· Heartburn or acid reflux. °· Some medicines. °· Chronic lung problems (COPD). °· Cancer. °SYMPTOMS  °· Cough. °· Fever. °· Chest pain. °· Increased breathing rate. °· High-pitched whistling sound when breathing (wheezing). °· Colored mucus that you cough up (sputum). °TREATMENT  °· A bacterial cough may be treated with antibiotic medicine. °· A viral cough must run its course and will not respond to antibiotics. °· Your caregiver may recommend other treatments if you have a chronic cough. °HOME CARE INSTRUCTIONS  °· Only take over-the-counter or prescription medicines for pain, discomfort, or fever as directed by your caregiver. Use cough suppressants only as directed by your caregiver. °· Use a cold steam vaporizer or humidifier in your bedroom or home to help loosen secretions. °· Sleep in a semi-upright position if your cough is worse at night. °· Rest as needed. °· Stop smoking if you smoke. °SEEK IMMEDIATE MEDICAL CARE IF:  °· You have pus in your sputum. °· Your cough starts to worsen. °· You cannot control your cough with suppressants and are losing sleep. °· You begin coughing up blood. °· You have difficulty breathing. °· You develop pain which is getting worse or is uncontrolled with medicine. °· You have a fever. °MAKE SURE YOU:  °· Understand these instructions. °· Will watch your condition. °· Will get help right away if you are not doing well or get worse. °Document Released: 11/03/2010 Document Revised: 07/30/2011 Document Reviewed: 11/03/2010 °ExitCare® Patient Information ©2015 [employer]. This information is not intended  to replace advice given to you by your health care provider. Make sure you discuss any questions you have with your health care provider. °Upper Respiratory Infection, Adult °An upper respiratory infection (URI) is also sometimes known as the common cold. The upper respiratory tract includes the nose, sinuses, throat, trachea, and bronchi. Bronchi are the airways leading to the lungs. Most people improve within 1 week, but symptoms can last up to 2 weeks. A residual cough may last even longer.  °CAUSES °Many different viruses can infect the tissues lining the upper respiratory tract. The tissues become irritated and inflamed and often become very moist. Mucus production is also common. A cold is contagious. You can easily spread the virus to others by oral contact. This includes kissing, sharing a glass, coughing, or sneezing. Touching your mouth or nose and then touching a surface, which is then touched by another person, can also spread the virus. °SYMPTOMS  °Symptoms typically develop 1 to 3 days after you come in contact with a cold virus. Symptoms vary from person to person. They may include: °· Runny nose. °· Sneezing. °· Nasal congestion. °· Sinus irritation. °· Sore throat. °· Loss of voice (laryngitis). °· Cough. °· Fatigue. °· Muscle aches. °· Loss of appetite. °· Headache. °· Low-grade fever. °DIAGNOSIS  °You might diagnose your own cold based on familiar symptoms, since most people get a cold 2 to 3 times a year. Your caregiver can confirm this based on your exam. Most importantly, your caregiver can check that your symptoms are not due to another disease such   as strep throat, sinusitis, pneumonia, asthma, or epiglottitis. Blood tests, throat tests, and X-rays are not necessary to diagnose a common cold, but they may sometimes be helpful in excluding other more serious diseases. Your caregiver will decide if any further tests are required. °RISKS AND COMPLICATIONS  °You may be at risk for a more severe  case of the common cold if you smoke cigarettes, have chronic heart disease (such as heart failure) or lung disease (such as asthma), or if you have a weakened immune system. The very young and very old are also at risk for more serious infections. Bacterial sinusitis, middle ear infections, and bacterial pneumonia can complicate the common cold. The common cold can worsen asthma and chronic obstructive pulmonary disease (COPD). Sometimes, these complications can require emergency medical care and may be life-threatening. °PREVENTION  °The best way to protect against getting a cold is to practice good hygiene. Avoid oral or hand contact with people with cold symptoms. Wash your hands often if contact occurs. There is no clear evidence that vitamin C, vitamin E, echinacea, or exercise reduces the chance of developing a cold. However, it is always recommended to get plenty of rest and practice good nutrition. °TREATMENT  °Treatment is directed at relieving symptoms. There is no cure. Antibiotics are not effective, because the infection is caused by a virus, not by bacteria. Treatment may include: °· Increased fluid intake. Sports drinks offer valuable electrolytes, sugars, and fluids. °· Breathing heated mist or steam (vaporizer or shower). °· Eating chicken soup or other clear broths, and maintaining good nutrition. °· Getting plenty of rest. °· Using gargles or lozenges for comfort. °· Controlling fevers with ibuprofen or acetaminophen as directed by your caregiver. °· Increasing usage of your inhaler if you have asthma. °Zinc gel and zinc lozenges, taken in the first 24 hours of the common cold, can shorten the duration and lessen the severity of symptoms. Pain medicines may help with fever, muscle aches, and throat pain. A variety of non-prescription medicines are available to treat congestion and runny nose. Your caregiver can make recommendations and may suggest nasal or lung inhalers for other symptoms.  °HOME  CARE INSTRUCTIONS  °· Only take over-the-counter or prescription medicines for pain, discomfort, or fever as directed by your caregiver. °· Use a warm mist humidifier or inhale steam from a shower to increase air moisture. This may keep secretions moist and make it easier to breathe. °· Drink enough water and fluids to keep your urine clear or pale yellow. °· Rest as needed. °· Return to work when your temperature has returned to normal or as your caregiver advises. You may need to stay home longer to avoid infecting others. You can also use a face mask and careful hand washing to prevent spread of the virus. °SEEK MEDICAL CARE IF:  °· After the first few days, you feel you are getting worse rather than better. °· You need your caregiver's advice about medicines to control symptoms. °· You develop chills, worsening shortness of breath, or brown or red sputum. These may be signs of pneumonia. °· You develop yellow or brown nasal discharge or pain in the face, especially when you bend forward. These may be signs of sinusitis. °· You develop a fever, swollen neck glands, pain with swallowing, or white areas in the back of your throat. These may be signs of strep throat. °SEEK IMMEDIATE MEDICAL CARE IF:  °· You have a fever. °· You develop severe or persistent headache, ear   pain, sinus pain, or chest pain. °· You develop wheezing, a prolonged cough, cough up blood, or have a change in your usual mucus (if you have chronic lung disease). °· You develop sore muscles or a stiff neck. °Document Released: 10/31/2000 Document Revised: 07/30/2011 Document Reviewed: 08/12/2013 °ExitCare® Patient Information ©2015 [employer]. This information is not intended to replace advice given to you by your health care provider. Make sure you discuss any questions you have with your health care provider. ° °

## 2014-06-07 NOTE — ED Notes (Signed)
Pt given ginger ale.

## 2014-06-07 NOTE — ED Notes (Signed)
Headache and  dry cough for a month.  Low grade fever earlier in the week.

## 2014-06-07 NOTE — ED Notes (Signed)
Scott RN to bedside to insert another IV

## 2014-06-07 NOTE — ED Provider Notes (Signed)
CSN: [id number]     Arrival date & time 06/07/14  1039 History   First MD Initiated Contact with Patient 06/07/14 1054     Chief Complaint  Patient presents with  . Headache  . Fever     (Consider location/radiation/quality/duration/timing/severity/associated sxs/prior Treatment) Patient is a 54 y.o. female presenting with cough.  Cough Cough characteristics:  Non-productive and dry Severity:  Moderate Onset quality:  Gradual Duration:  4 weeks Timing:  Constant Progression:  Unchanged Relieved by:  Nothing Ineffective treatments:  Rest, beta-agonist inhaler and cough suppressants Associated symptoms: ear pain (left), fever, headaches, shortness of breath (occasionally) and sinus congestion   Associated symptoms: no chest pain     Past Medical History  Diagnosis Date  . Pulmonary embolism   . Presence of inferior vena cava filter   . PFO (patent foramen ovale)   . Status post gastric bypass for obesity   . Headache(784.0)     migraines  . Arthritis     osteoarthritis  . Urinary frequency   . Urinary incontinence   . Chronic back pain   . Sleep apnea     used CPAP until after bypass surg.  . Stroke 12/2002    right-sided weakness  . DVT of upper extremity (deep vein thrombosis)   . History of shingles 07/2011  . Complication of anesthesia     states takes more than normal to put her to sleep  . Asthma     states no asthma attack since 2002  . Dental crowns present   . Dental bridge present     upper  . Fibromyalgia   . History of gallstones   . HTN (hypertension)   . History of pneumonia   . History of blood transfusion 06/2005   Past Surgical History  Procedure Laterality Date  . Bunionectomy  05/1980    both feet  . Other surgical history  12/1986    pt states that she had surgery to unclog her fallopean tubes  . Cholecystectomy  1990  . Hemorrhoid surgery  03/1993  . Uterine fibroid surgery  12/95, 7/96    x2  . Tonsillectomy  07/1995  . Heel spur  surgery  08/1997    left  . Laparoscopic lysis intestinal adhesions  02/14/2000  . Shoulder surgery      bilat. - (left:  06/2005)  . Carpal tunnel release  06/21/2009    right  . Gastric roux-en-y  2009  . Cardiac catheterization  03/04/2002  . Anterior cervical decomp/discectomy fusion  02/05/2005    C5-6  . Trigger finger release  04/25/2006    decompression A-1 pulley left thumb  . Elbow surgery  08/09/2004    decompression ulnar nerve right elbow  . Enterolysis  10/22/2008    laparoscopic abd. enterolysis  . Appendectomy  10/22/2008    laparoscopic  . Nailbed repair  01/10/2005; 08/2011    exc. matrix bilat. great toe  . Abdominal hysterectomy  11/1997    complete  . Bunionectomy  08/2011    left foot  . Vena cava filter placement  2009    during Roux-en-Y surg.  . Carpal tunnel release      left hand  . Carpal tunnel release  10/03/2011    Procedure: CARPAL TUNNEL RELEASE;  Surgeon: Wynonia Sours, MD;  Location: [facility];  Service: Orthopedics;  Laterality: Right;  CARPAL TUNNEL WITH HYPOTHENAR FAT PAD TRANSFER  . Cervical spine surgery  01/2005  titanium plate implanted   Family History  Problem Relation Age of Onset  . Breast cancer Paternal Aunt   . Breast cancer Paternal Grandmother   . Breast cancer Maternal Grandmother   . Colon polyps Father   . Diabetes Father     borderline  . Hypertension Mother   . Hypertension     History  Substance Use Topics  . Smoking status: Former Research scientist (life sciences)  . Smokeless tobacco: Never Used     Comment: quit smoking 08/1989  . Alcohol Use: No   OB History    No data available     Review of Systems  Constitutional: Positive for fever.  HENT: Positive for ear pain (left).   Respiratory: Positive for cough and shortness of breath (occasionally).   Cardiovascular: Negative for chest pain.  Neurological: Positive for headaches.  All other systems reviewed and are negative.     Allergies  Oxycodone hcl; Propoxyphene  n-acetaminophen; Oxycontin; Adhesive; Aspirin; and Prednisone  Home Medications   Prior to Admission medications   Medication Sig Start Date End Date Taking? Authorizing Provider  albuterol (PROVENTIL HFA;VENTOLIN HFA) 108 (90 BASE) MCG/ACT inhaler Inhale 2 puffs into the lungs every 6 (six) hours as needed. Shortness of breath and wheezing     Historical Provider, MD  baclofen (LIORESAL) 20 MG tablet Take 20 mg by mouth daily.      Historical Provider, MD  calcium citrate (CALCITRATE - DOSED IN MG ELEMENTAL CALCIUM) 950 MG tablet Take 500 mg by mouth 2 (two) times daily. CHEWABLE    Historical Provider, MD  Camphor-Menthol-Methyl Sal (SALONPAS) 1.2-5.7-6.3 % PTCH Apply 1 patch topically every 8 (eight) hours as needed. For pain    Historical Provider, MD  cyanocobalamin 1000 MCG tablet Place 1,500 mcg under the tongue daily.     Historical Provider, MD  cyclobenzaprine (FLEXERIL) 5 MG tablet Take 5 mg by mouth every 8 (eight) hours as needed.    Historical Provider, MD  guaiFENesin-codeine 100-10 MG/5ML syrup Take 5 mLs by mouth every 6 (six) hours as needed for cough. 05/18/14   Ernestina Patches, MD  HYDROcodone-acetaminophen (NORCO/VICODIN) 5-325 MG per tablet Take 1 tablet by mouth every 4 (four) hours as needed. 02/15/14   Threasa Beards, MD  MOVIPREP 100 G SOLR Take 1 kit (100 g total) by mouth once. 12/05/11   Irene Shipper, MD  Multiple Vitamins-Minerals ([employer] SPECTRAVITE) CHEW Chew 1 tablet by mouth daily.    Historical Provider, MD  solifenacin (VESICARE) 5 MG tablet Take 5 mg by mouth daily. AM    Historical Provider, MD  traMADol (ULTRAM) 50 MG tablet Take 50 mg by mouth every 6 (six) hours as needed.    Historical Provider, MD  valACYclovir (VALTREX) 500 MG tablet Take 500 mg by mouth daily. AM    Historical Provider, MD   BP 155/87 mmHg  Pulse 63  Temp(Src) 98.5 F (36.9 C) (Oral)  Resp 20  Ht 5' 1" (1.549 m)  Wt 208 lb (94.348 kg)  BMI 39.32 kg/m2  SpO2 100% Physical Exam   Constitutional: She is oriented to person, place, and time. She appears well-developed and well-nourished. No distress.  HENT:  Head: Normocephalic and atraumatic.  Right Ear: Hearing, tympanic membrane, external ear and ear canal normal.  Left Ear: Hearing, tympanic membrane, external ear and ear canal normal.  Nose: No mucosal edema.  Mouth/Throat: Oropharynx is clear and moist. No oropharyngeal exudate, posterior oropharyngeal edema, posterior oropharyngeal erythema or tonsillar abscesses.  Eyes:  Conjunctivae are normal. Pupils are equal, round, and reactive to light. No scleral icterus.  Neck: Neck supple.  Cardiovascular: Normal rate, regular rhythm, normal heart sounds and intact distal pulses.   No murmur heard. Pulmonary/Chest: Effort normal and breath sounds normal. No stridor. No respiratory distress. She has no rales.  Abdominal: Soft. Bowel sounds are normal. She exhibits no distension. There is no tenderness.  Musculoskeletal: Normal range of motion.  Neurological: She is alert and oriented to person, place, and time.  Skin: Skin is warm and dry. No rash noted.  Psychiatric: She has a normal mood and affect. Her behavior is normal.  Nursing note and vitals reviewed.   ED Course  Procedures (including critical care time)  Emergency Ultrasound Study:   Angiocath insertion Performed by: Arbie Cookey  Consent: Verbal consent obtained. Risks and benefits: risks, benefits and alternatives were discussed Immediately prior to procedure the correct patient, procedure, equipment, support staff and site/side marked as needed.  Indication: difficult IV access Preparation: Patient was prepped and draped in the usual sterile fashion. Vein Location: left brachial vein was visualized during assessment for potential access sites and was found to be patent/ easily compressed with linear ultrasound.  The needle was visualized with real-time ultrasound and guided into the  vein. Gauge: 20  Image saved and stored.  Normal blood return.  Patient tolerance: Patient tolerated the procedure well with no immediate complications.     Labs Review Labs Reviewed  D-DIMER, QUANTITATIVE - Abnormal; Notable for the following:    D-Dimer, Quant 0.73 (*)    All other components within normal limits  CBC WITH DIFFERENTIAL  COMPREHENSIVE METABOLIC PANEL  I-STAT TROPOININ, ED    Imaging Review Ct Angio Chest Pe W/cm &/or Wo Cm  06/07/2014   CLINICAL DATA:  Initial encounter for shortness of breath. Chest pain. History of IVC filter. Gastric bypass. Gallstones and asthma.  EXAM: CT ANGIOGRAPHY CHEST WITH CONTRAST  TECHNIQUE: Multidetector CT imaging of the chest was performed using the standard protocol during bolus administration of intravenous contrast. Multiplanar CT image reconstructions and MIPs were obtained to evaluate the vascular anatomy.  CONTRAST:  167m OMNIPAQUE IOHEXOL 350 MG/ML SOLN  COMPARISON:  05/18/2014 CT of 02/15/2014.  FINDINGS: Mediastinum/Nodes: The quality of this exam for evaluation of pulmonary embolism is good to excellent. No evidence of pulmonary embolism.  Normal aortic caliber without dissection. Minimal atherosclerosis is seen within the left subclavian artery. Heart size upper normal, without pericardial effusion. LAD coronary artery atherosclerosis is identified on image 49 of series 6 and more inferiorly on image 50 of series 6. No mediastinal or hilar adenopathy.  Lungs/Pleura: No airspace opacities.  No pleural fluid.  Upper abdomen: Normal imaged portions of the liver, spleen, left adrenal gland and left kidney. Status post gastric bypass.  Musculoskeletal: Lower cervical spine fixation. Lower thoracic spondylosis.  Review of the MIP images confirms the above findings.  IMPRESSION: 1.  No evidence of pulmonary embolism. 2. Age advanced coronary artery atherosclerosis. Recommend assessment of coronary risk factors and consideration of medical  therapy.   Electronically Signed   By: KAbigail MiyamotoM.D.   On: 06/07/2014 14:31  All radiology studies independently viewed by me.      EKG Interpretation   Date/Time:  Monday June 07 2014 11:36:19 EST Ventricular Rate:  45 PR Interval:  162 QRS Duration: 84 QT Interval:  494 QTC Calculation: 427 R Axis:   52 Text Interpretation:  Sinus bradycardia Nonspecific T wave  abnormality  Abnormal ECG borderline t wave abnormality not significantly changed from  prior.  Confirmed by [facility]  MD, TREY ([id number]) on 06/07/2014 1:32:43 PM      MDM   Final diagnoses:  Shortness of breath  Cough  URI (upper respiratory infection)    54 yo female with about 4 weeks of cough, URI symptoms, and intermittent SOB.  All in all, she appears to have a protracted URI.  She has already been treated with azithromycin.  She feels that "something is not right, this isn't me".  She has hx of PE, not currently anticoagulated.  She has had intermittent SOB and difficulty breathing.  Some of her symptoms now remind her of her symptoms when she had a PE.  D Dimer checked and elevated.  Plan CTA.  This will also help evaluate for pneumonia.    CT negative.  Remained well appearing.  Advised continued supportive treatment for cough and URI.  Houston Siren III, MD 06/07/14 [id number]

## 2014-06-07 NOTE — ED Notes (Signed)
MD at bedside. 

## 2015-01-27 ENCOUNTER — Other Ambulatory Visit (INDEPENDENT_AMBULATORY_CARE_PROVIDER_SITE_OTHER): Payer: Self-pay | Admitting: Physician Assistant

## 2015-01-27 DIAGNOSIS — R1033 Periumbilical pain: Secondary | ICD-10-CM

## 2015-01-31 ENCOUNTER — Ambulatory Visit
Admission: RE | Admit: 2015-01-31 | Discharge: 2015-01-31 | Disposition: A | Discharge status: Home / Self Care | Payer: Medicare Other | Source: Ambulatory Visit | Attending: Physician Assistant | Admitting: Physician Assistant

## 2015-01-31 DIAGNOSIS — R1033 Periumbilical pain: Secondary | ICD-10-CM

## 2015-01-31 MED ORDER — IOPAMIDOL (ISOVUE-300) INJECTION 61%
125.0000 mL | Freq: Once | INTRAVENOUS | Status: AC | PRN
  Administered 2015-01-31: 125 mL via INTRAVENOUS

## 2015-02-16 ENCOUNTER — Other Ambulatory Visit: Payer: Self-pay

## 2015-02-16 DIAGNOSIS — Z1231 Encounter for screening mammogram for malignant neoplasm of breast: Secondary | ICD-10-CM

## 2015-03-21 ENCOUNTER — Ambulatory Visit
Admission: RE | Admit: 2015-03-21 | Discharge: 2015-03-21 | Disposition: A | Discharge status: Home / Self Care | Payer: Medicare Other | Source: Ambulatory Visit

## 2015-03-21 DIAGNOSIS — Z1231 Encounter for screening mammogram for malignant neoplasm of breast: Secondary | ICD-10-CM

## 2015-05-24 MED FILL — GABAPENTIN 300 MG CAPSULE: 300 | 30 days supply | Qty: 90 | Fill #1

## 2015-06-14 MED FILL — predniSONE 5 MG (21) TBPK: 5 | 6 days supply | Qty: 21 | Fill #0

## 2015-06-14 MED FILL — POLYETHYLENE GLYCOL 3350 PO: 20 days supply | Qty: 527 | Fill #3

## 2015-06-22 DIAGNOSIS — M797 Fibromyalgia: Secondary | ICD-10-CM | POA: Diagnosis not present

## 2015-06-22 DIAGNOSIS — D649 Anemia, unspecified: Secondary | ICD-10-CM | POA: Diagnosis not present

## 2015-06-22 DIAGNOSIS — K912 Postsurgical malabsorption, not elsewhere classified: Secondary | ICD-10-CM | POA: Diagnosis not present

## 2015-06-22 DIAGNOSIS — J4520 Mild intermittent asthma, uncomplicated: Secondary | ICD-10-CM | POA: Diagnosis not present

## 2015-06-22 DIAGNOSIS — M1990 Unspecified osteoarthritis, unspecified site: Secondary | ICD-10-CM | POA: Diagnosis not present

## 2015-06-22 DIAGNOSIS — B009 Herpesviral infection, unspecified: Secondary | ICD-10-CM | POA: Diagnosis not present

## 2015-06-22 DIAGNOSIS — N3281 Overactive bladder: Secondary | ICD-10-CM | POA: Diagnosis not present

## 2015-06-22 DIAGNOSIS — I6789 Other cerebrovascular disease: Secondary | ICD-10-CM | POA: Diagnosis not present

## 2015-06-22 MED FILL — TOLTERODINE TART ER 4 MG CA: 4 | 30 days supply | Qty: 30 | Fill #0

## 2015-06-23 MED FILL — METHOCARBAMOL 500 MG TABLET: 500 | 30 days supply | Qty: 120 | Fill #0

## 2015-07-07 DIAGNOSIS — Z9884 Bariatric surgery status: Secondary | ICD-10-CM | POA: Diagnosis not present

## 2015-07-07 DIAGNOSIS — M47816 Spondylosis without myelopathy or radiculopathy, lumbar region: Secondary | ICD-10-CM | POA: Diagnosis not present

## 2015-07-07 DIAGNOSIS — M545 Low back pain: Secondary | ICD-10-CM | POA: Diagnosis not present

## 2015-07-07 DIAGNOSIS — M5136 Other intervertebral disc degeneration, lumbar region: Secondary | ICD-10-CM | POA: Diagnosis not present

## 2015-07-18 DIAGNOSIS — M47816 Spondylosis without myelopathy or radiculopathy, lumbar region: Secondary | ICD-10-CM | POA: Diagnosis not present

## 2015-07-20 MED FILL — TOLTERODINE TART ER 4 MG CA: 4 | 30 days supply | Qty: 30 | Fill #1

## 2015-07-26 DIAGNOSIS — M47816 Spondylosis without myelopathy or radiculopathy, lumbar region: Secondary | ICD-10-CM | POA: Diagnosis not present

## 2015-08-01 MED FILL — SSD 1% CREAM: 1 | 30 days supply | Qty: 400 | Fill #0

## 2015-08-04 MED FILL — traMADol HCL 50 MG TABS: 50 | 30 days supply | Qty: 90 | Fill #0

## 2015-08-15 MED FILL — TOLTERODINE TART ER 4 MG CA: 4 | 30 days supply | Qty: 30 | Fill #2

## 2015-09-14 MED FILL — OXYBUTYNIN 5 MG TABLET: 5 | 30 days supply | Qty: 60 | Fill #0

## 2015-09-21 DIAGNOSIS — I160 Hypertensive urgency: Secondary | ICD-10-CM | POA: Diagnosis not present

## 2015-09-21 DIAGNOSIS — J4520 Mild intermittent asthma, uncomplicated: Secondary | ICD-10-CM | POA: Diagnosis not present

## 2015-09-21 DIAGNOSIS — M47817 Spondylosis without myelopathy or radiculopathy, lumbosacral region: Secondary | ICD-10-CM | POA: Diagnosis not present

## 2015-09-21 DIAGNOSIS — M47816 Spondylosis without myelopathy or radiculopathy, lumbar region: Secondary | ICD-10-CM | POA: Diagnosis not present

## 2015-09-21 MED FILL — LISINOPRIL 20 MG TABLET: 20 | 30 days supply | Qty: 30 | Fill #0

## 2015-09-21 MED FILL — AMLODIPINE BESYLATE 5 MG TA: 5 | 30 days supply | Qty: 30 | Fill #0

## 2015-09-21 MED FILL — VENTOLIN HFA 90 MCG INHALER: 108 (90 BAS | 30 days supply | Qty: 18 | Fill #0

## 2015-09-27 ENCOUNTER — Telehealth: Payer: Self-pay | Admitting: Cardiology

## 2015-09-27 NOTE — Telephone Encounter (Signed)
09/27/2015 Received faxed referral packet from [facility] for upcoming appointment with Dr. Stanford Breed. Records given to [facility]. cbr

## 2015-09-29 ENCOUNTER — Encounter (HOSPITAL_BASED_OUTPATIENT_CLINIC_OR_DEPARTMENT_OTHER): Payer: Self-pay | Admitting: *Deleted

## 2015-09-29 ENCOUNTER — Observation Stay (HOSPITAL_BASED_OUTPATIENT_CLINIC_OR_DEPARTMENT_OTHER)
Admission: EM | Admit: 2015-09-29 | Discharge: 2015-10-01 | Disposition: A | Discharge status: Home / Self Care | Payer: Medicare Other | Attending: Family Medicine | Admitting: Family Medicine

## 2015-09-29 ENCOUNTER — Emergency Department (HOSPITAL_BASED_OUTPATIENT_CLINIC_OR_DEPARTMENT_OTHER): Payer: Medicare Other

## 2015-09-29 DIAGNOSIS — Z87891 Personal history of nicotine dependence: Secondary | ICD-10-CM | POA: Insufficient documentation

## 2015-09-29 DIAGNOSIS — I10 Essential (primary) hypertension: Secondary | ICD-10-CM | POA: Insufficient documentation

## 2015-09-29 DIAGNOSIS — R0789 Other chest pain: Principal | ICD-10-CM | POA: Insufficient documentation

## 2015-09-29 DIAGNOSIS — R079 Chest pain, unspecified: Secondary | ICD-10-CM

## 2015-09-29 DIAGNOSIS — Z9884 Bariatric surgery status: Secondary | ICD-10-CM | POA: Diagnosis not present

## 2015-09-29 DIAGNOSIS — Z86711 Personal history of pulmonary embolism: Secondary | ICD-10-CM | POA: Diagnosis not present

## 2015-09-29 DIAGNOSIS — Z79899 Other long term (current) drug therapy: Secondary | ICD-10-CM | POA: Insufficient documentation

## 2015-09-29 DIAGNOSIS — Z86718 Personal history of other venous thrombosis and embolism: Secondary | ICD-10-CM | POA: Diagnosis not present

## 2015-09-29 DIAGNOSIS — R519 Headache, unspecified: Secondary | ICD-10-CM

## 2015-09-29 DIAGNOSIS — M7989 Other specified soft tissue disorders: Secondary | ICD-10-CM

## 2015-09-29 DIAGNOSIS — I82629 Acute embolism and thrombosis of deep veins of unspecified upper extremity: Secondary | ICD-10-CM | POA: Diagnosis present

## 2015-09-29 DIAGNOSIS — R51 Headache: Secondary | ICD-10-CM

## 2015-09-29 DIAGNOSIS — Z8673 Personal history of transient ischemic attack (TIA), and cerebral infarction without residual deficits: Secondary | ICD-10-CM | POA: Diagnosis present

## 2015-09-29 LAB — CBC WITH DIFFERENTIAL/PLATELET
Basophils Absolute: 0 10*3/uL (ref 0.0–0.1)
Basophils Relative: 1 %
Eosinophils Absolute: 0.1 10*3/uL (ref 0.0–0.7)
Eosinophils Relative: 2 %
HCT: 34.7 % — ABNORMAL LOW (ref 36.0–46.0)
Hemoglobin: 11.6 g/dL — ABNORMAL LOW (ref 12.0–15.0)
Lymphocytes Relative: 56 %
Lymphs Abs: 2.9 10*3/uL (ref 0.7–4.0)
MCH: 31.2 pg (ref 26.0–34.0)
MCHC: 33.4 g/dL (ref 30.0–36.0)
MCV: 93.3 fL (ref 78.0–100.0)
Monocytes Absolute: 0.5 10*3/uL (ref 0.1–1.0)
Monocytes Relative: 9 %
Neutro Abs: 1.6 10*3/uL — ABNORMAL LOW (ref 1.7–7.7)
Neutrophils Relative %: 32 %
Platelets: 173 10*3/uL (ref 150–400)
RBC: 3.72 MIL/uL — ABNORMAL LOW (ref 3.87–5.11)
RDW: 13.5 % (ref 11.5–15.5)
WBC: 5.1 10*3/uL (ref 4.0–10.5)

## 2015-09-29 LAB — COMPREHENSIVE METABOLIC PANEL
ALT: 15 U/L (ref 14–54)
AST: 18 U/L (ref 15–41)
Albumin: 4.1 g/dL (ref 3.5–5.0)
Alkaline Phosphatase: 57 U/L (ref 38–126)
Anion gap: 4 — ABNORMAL LOW (ref 5–15)
BUN: 14 mg/dL (ref 6–20)
CO2: 29 mmol/L (ref 22–32)
Calcium: 9.1 mg/dL (ref 8.9–10.3)
Chloride: 104 mmol/L (ref 101–111)
Creatinine, Ser: 0.68 mg/dL (ref 0.44–1.00)
GFR calc Af Amer: >60 mL/min (ref >60)
GFR calc non Af Amer: >60 mL/min (ref >60)
Glucose, Bld: 93 mg/dL (ref 65–99)
Potassium: 3.7 mmol/L (ref 3.5–5.1)
Sodium: 137 mmol/L (ref 135–145)
Total Bilirubin: 0.7 mg/dL (ref 0.3–1.2)
Total Protein: 7.3 g/dL (ref 6.5–8.1)

## 2015-09-29 LAB — TROPONIN I: Troponin I: <0.03 ng/mL (ref <0.031)

## 2015-09-29 MED ORDER — NITROGLYCERIN 0.4 MG SL SUBL
0.4000 mg | SUBLINGUAL_TABLET | SUBLINGUAL | Status: DC | PRN
  Administered 2015-09-29 – 2015-09-30 (×6): 0.4 mg via SUBLINGUAL
  Filled 2015-09-29: qty 1
  Filled 2015-09-29: qty 25
  Filled 2015-09-29: qty 1

## 2015-09-29 MED ORDER — GI COCKTAIL ~~LOC~~
30.0000 mL | Freq: Once | ORAL | Status: AC
  Administered 2015-09-29: 30 mL via ORAL
  Filled 2015-09-29: qty 30

## 2015-09-29 MED ORDER — ASPIRIN 81 MG PO CHEW
324.0000 mg | CHEWABLE_TABLET | Freq: Once | ORAL | Status: AC
  Administered 2015-09-29: 324 mg via ORAL
  Filled 2015-09-29: qty 4

## 2015-09-29 MED ORDER — ACETAMINOPHEN 325 MG PO TABS
650.0000 mg | ORAL_TABLET | Freq: Once | ORAL | Status: AC
  Administered 2015-09-29: 650 mg via ORAL
  Filled 2015-09-29: qty 2

## 2015-09-29 NOTE — ED Notes (Signed)
Pt c/o left sided chest pain x 2 weeks which radiates down left arm

## 2015-09-29 NOTE — ED Notes (Signed)
MD at bedside. 

## 2015-09-29 NOTE — ED Notes (Addendum)
Attempted to obtain venous labs in patient's left ac. Patient stated the needle hurt and to stop. Patient then stated she did not want any labs taken. Procedure stopped and dressing applied to puncture site. RN's [employer].

## 2015-09-29 NOTE — ED Provider Notes (Signed)
CSN: [id number]     Arrival date & time 09/29/15  2014 History  By signing my name below, I, Rowan Blase, attest that this documentation has been prepared under the direction and in the presence of Quintella Reichert, MD . Electronically Signed: Rowan Blase, Scribe. 09/29/2015. 8:47 PM.   Chief Complaint  Patient presents with  . Chest Pain   The history is provided by the patient. No language interpreter was used.   HPI Comments:  [name] is a 55 y.o. female with PMHx of PE, HTN and inferior vena cava filter who presents to the Emergency Department complaining of intermittent, aching left side chest pain for the past 2 weeks, worsening in the last few days. She states pain occasionally feels like "electricity"; pain last night felt like heartburn lasting ~20 minutes. Pt reports associated aching left arm pain, left-side neck pain, abdominal pain, nausea, headache, dry cough, and intermittent diaphoresis. No alleviating or exacerbating factors noted. Denies fever, shortness of breath, leg swelling, leg pain, use of blood thinners, h/o DM, or h/o heart disease.  Past Medical History  Diagnosis Date  . Pulmonary embolism ([place])   . Presence of inferior vena cava filter   . PFO (patent foramen ovale)   . Status post gastric bypass for obesity   . Headache(784.0)     migraines  . Arthritis     osteoarthritis  . Urinary frequency   . Urinary incontinence   . Chronic back pain   . Sleep apnea     used CPAP until after bypass surg.  . Stroke ([place]) 12/2002    right-sided weakness  . DVT of upper extremity (deep vein thrombosis) ([place])   . History of shingles 07/2011  . Complication of anesthesia     states takes more than normal to put her to sleep  . Asthma     states no asthma attack since 2002  . Dental crowns present   . Dental bridge present     upper  . Fibromyalgia   . History of gallstones   . HTN (hypertension)   . History of pneumonia   . History of blood  transfusion 06/2005   Past Surgical History  Procedure Laterality Date  . Bunionectomy  05/1980    both feet  . Other surgical history  12/1986    pt states that she had surgery to unclog her fallopean tubes  . Cholecystectomy  1990  . Hemorrhoid surgery  03/1993  . Uterine fibroid surgery  12/95, 7/96    x2  . Tonsillectomy  07/1995  . Heel spur surgery  08/1997    left  . Laparoscopic lysis intestinal adhesions  02/14/2000  . Shoulder surgery      bilat. - (left:  06/2005)  . Carpal tunnel release  06/21/2009    right  . Gastric roux-en-y  2009  . Cardiac catheterization  03/04/2002  . Anterior cervical decomp/discectomy fusion  02/05/2005    C5-6  . Trigger finger release  04/25/2006    decompression A-1 pulley left thumb  . Elbow surgery  08/09/2004    decompression ulnar nerve right elbow  . Enterolysis  10/22/2008    laparoscopic abd. enterolysis  . Appendectomy  10/22/2008    laparoscopic  . Nailbed repair  01/10/2005; 08/2011    exc. matrix bilat. great toe  . Abdominal hysterectomy  11/1997    complete  . Bunionectomy  08/2011    left foot  . Vena cava filter placement  2009  during Roux-en-Y surg.  . Carpal tunnel release      left hand  . Carpal tunnel release  10/03/2011    Procedure: CARPAL TUNNEL RELEASE;  Surgeon: Wynonia Sours, MD;  Location: [facility];  Service: Orthopedics;  Laterality: Right;  CARPAL TUNNEL WITH HYPOTHENAR FAT PAD TRANSFER  . Cervical spine surgery  01/2005    titanium plate implanted   Family History  Problem Relation Age of Onset  . Breast cancer Paternal Aunt   . Breast cancer Paternal Grandmother   . Breast cancer Maternal Grandmother   . Colon polyps Father   . Diabetes Father     borderline  . Hypertension Mother   . Hypertension     Social History  Substance Use Topics  . Smoking status: Former Research scientist (life sciences)  . Smokeless tobacco: Never Used     Comment: quit smoking 08/1989  . Alcohol Use: No   OB History    No data  available     Review of Systems  Constitutional: Positive for diaphoresis. Negative for fever.  Respiratory: Positive for cough. Negative for shortness of breath.   Cardiovascular: Positive for chest pain. Negative for leg swelling.  Gastrointestinal: Positive for nausea and abdominal pain.  Musculoskeletal: Positive for arthralgias and neck pain.  Neurological: Positive for headaches.  All other systems reviewed and are negative.  Allergies  Oxycodone hcl; Propoxyphene n-acetaminophen; Oxycontin; Adhesive; Aspirin; and Prednisone  Home Medications   Prior to Admission medications   Medication Sig Start Date End Date Taking? Authorizing Provider  albuterol (PROVENTIL HFA;VENTOLIN HFA) 108 (90 BASE) MCG/ACT inhaler Inhale 2 puffs into the lungs every 6 (six) hours as needed. Shortness of breath and wheezing     Historical Provider, MD  baclofen (LIORESAL) 20 MG tablet Take 20 mg by mouth daily.      Historical Provider, MD  calcium citrate (CALCITRATE - DOSED IN MG ELEMENTAL CALCIUM) 950 MG tablet Take 500 mg by mouth 2 (two) times daily. CHEWABLE    Historical Provider, MD  Camphor-Menthol-Methyl Sal (SALONPAS) 1.2-5.7-6.3 % PTCH Apply 1 patch topically every 8 (eight) hours as needed. For pain    Historical Provider, MD  cyanocobalamin 1000 MCG tablet Place 1,500 mcg under the tongue daily.     Historical Provider, MD  cyclobenzaprine (FLEXERIL) 5 MG tablet Take 5 mg by mouth every 8 (eight) hours as needed.    Historical Provider, MD  guaiFENesin-codeine 100-10 MG/5ML syrup Take 5 mLs by mouth every 6 (six) hours as needed for cough. 05/18/14   Ernestina Patches, MD  HYDROcodone-acetaminophen (NORCO/VICODIN) 5-325 MG per tablet Take 1 tablet by mouth every 4 (four) hours as needed. 02/15/14   Alfonzo Beers, MD  MOVIPREP 100 G SOLR Take 1 kit (100 g total) by mouth once. 12/05/11   Irene Shipper, MD  Multiple Vitamins-Minerals ([employer] SPECTRAVITE) CHEW Chew 1 tablet by mouth daily.    Historical  Provider, MD  solifenacin (VESICARE) 5 MG tablet Take 5 mg by mouth daily. AM    Historical Provider, MD  traMADol (ULTRAM) 50 MG tablet Take 50 mg by mouth every 6 (six) hours as needed.    Historical Provider, MD  valACYclovir (VALTREX) 500 MG tablet Take 500 mg by mouth daily. AM    Historical Provider, MD   BP 121/73 mmHg  Pulse 58  Temp(Src) 98.5 F (36.9 C) (Oral)  Resp 19  Ht _0  (1.549 m)  Wt 205 lb (92.987 kg)  BMI 38.75 kg/m2  SpO2  100% Physical Exam  Constitutional: She is oriented to person, place, and time. She appears well-developed and well-nourished.  HENT:  Head: Normocephalic and atraumatic.  Cardiovascular: Regular rhythm.  Bradycardia present.   No murmur heard. Pulmonary/Chest: Effort normal and breath sounds normal. No respiratory distress.  Abdominal: Soft. There is no tenderness. There is no rebound and no guarding.  Musculoskeletal: She exhibits no tenderness.  Non-pitting edema BL LE  Neurological: She is alert and oriented to person, place, and time.  Skin: Skin is warm and dry.  Psychiatric: She has a normal mood and affect. Her behavior is normal.  Nursing note and vitals reviewed.   ED Course  Procedures  DIAGNOSTIC STUDIES:  Oxygen Saturation is 100% on RA, normal by my interpretation.    COORDINATION OF CARE:  8:46 PM Discussed treatment plan with pt at bedside and pt agreed to plan.  Labs Review Labs Reviewed  COMPREHENSIVE METABOLIC PANEL - Abnormal; Notable for the following:    Anion gap 4 (*)    All other components within normal limits  CBC WITH DIFFERENTIAL/PLATELET - Abnormal; Notable for the following:    RBC 3.72 (*)    Hemoglobin 11.6 (*)    HCT 34.7 (*)    Neutro Abs 1.6 (*)    All other components within normal limits  TROPONIN I  TROPONIN I    Imaging Review Dg Chest 2 View  09/29/2015  CLINICAL DATA:  Intermittent left-sided chest pain for 2 weeks, worse over the past few days. EXAM: CHEST  2 VIEW COMPARISON:   05/18/2014 FINDINGS: The lungs are clear. The pulmonary vasculature is normal. Heart size is normal. Hilar and mediastinal contours are unremarkable. There is no pleural effusion. IMPRESSION: No active cardiopulmonary disease. Electronically Signed   By: Andreas Newport M.D.   On: 09/29/2015 21:25   I have personally reviewed and evaluated these images and lab results as part of my medical decision-making.   EKG Interpretation   Date/Time:  Friday Sep 30 2015 01:21:06 EDT Ventricular Rate:  58 PR Interval:  160 QRS Duration: 98 QT Interval:  442 QTC Calculation: 434 R Axis:   23 Text Interpretation:  Sinus rhythm Normal EKG Unchanged Confirmed by  Florina Ou  MD, Jenny Reichmann ([id number]) on 09/30/2015 1:33:48 AM Also confirmed by Florina Ou   MD, Jenny Reichmann ([id number]), editor [place]  CCT, [place] ([id number])  on 09/30/2015  7:19:31 AM      MDM   Final diagnoses:  Chest pain, unspecified chest pain type   Patient here for evaluation of 2 weeks of waxing and waning left-sided chest pain. EKG with new modalities and initial troponin is negative. Reviewed prior hospital visit records and she has had a CT scan of her chest a year ago that did show atherosclerosis. Discussed with patient option for observation with possible stress testing in the morning versus repeat troponin and patient prefers repeat troponin with outpatient cardiology follow-up. Current presentation is not consistent with pulmonary embolism or dissection. Plan to check repeat troponin and if this is within normal limits DC home with close cardiology follow-up.  I personally performed the services described in this documentation, which was scribed in my presence. The recorded information has been reviewed and is accurate.    Quintella Reichert, MD 09/30/15 [phone]

## 2015-09-30 ENCOUNTER — Observation Stay (HOSPITAL_COMMUNITY): Payer: Medicare Other

## 2015-09-30 ENCOUNTER — Encounter (HOSPITAL_COMMUNITY): Payer: Self-pay | Admitting: Radiology

## 2015-09-30 DIAGNOSIS — R079 Chest pain, unspecified: Secondary | ICD-10-CM | POA: Diagnosis present

## 2015-09-30 DIAGNOSIS — R51 Headache: Secondary | ICD-10-CM | POA: Diagnosis not present

## 2015-09-30 DIAGNOSIS — R072 Precordial pain: Secondary | ICD-10-CM

## 2015-09-30 LAB — LIPID PANEL
Cholesterol: 199 mg/dL (ref 0–200)
HDL: 77 mg/dL (ref >40)
LDL Cholesterol: 110 mg/dL — ABNORMAL HIGH (ref 0–99)
Total CHOL/HDL Ratio: 2.6 RATIO
Triglycerides: 58 mg/dL (ref <150)
VLDL: 12 mg/dL (ref 0–40)

## 2015-09-30 LAB — TROPONIN I
Troponin I: <0.03 ng/mL (ref <0.031)
Troponin I: <0.03 ng/mL (ref <0.031)
Troponin I: <0.03 ng/mL (ref <0.031)

## 2015-09-30 LAB — D-DIMER, QUANTITATIVE: D-Dimer, Quant: 4.55 ug/mL-FEU — ABNORMAL HIGH (ref 0.00–0.50)

## 2015-09-30 MED ORDER — NITROGLYCERIN 0.4 MG SL SUBL
0.4000 mg | SUBLINGUAL_TABLET | SUBLINGUAL | Status: DC | PRN
  Filled 2015-09-30: qty 25

## 2015-09-30 MED ORDER — ASPIRIN EC 325 MG PO TBEC
325.0000 mg | DELAYED_RELEASE_TABLET | Freq: Every day | ORAL | Status: DC
  Administered 2015-09-30 – 2015-10-01 (×2): 325 mg via ORAL
  Filled 2015-09-30 (×2): qty 1

## 2015-09-30 MED ORDER — IOPAMIDOL (ISOVUE-370) INJECTION 76%
INTRAVENOUS | Status: AC
  Administered 2015-09-30: 100 mL
  Filled 2015-09-30: qty 100

## 2015-09-30 MED ORDER — NITROGLYCERIN 2 % TD OINT
1.0000 [in_us] | TOPICAL_OINTMENT | Freq: Once | TRANSDERMAL | Status: AC
  Administered 2015-09-30: 1 [in_us] via TOPICAL
  Filled 2015-09-30: qty 1

## 2015-09-30 MED ORDER — ACETAMINOPHEN 325 MG PO TABS
650.0000 mg | ORAL_TABLET | Freq: Once | ORAL | Status: AC
  Administered 2015-09-30: 650 mg via ORAL
  Filled 2015-09-30: qty 2

## 2015-09-30 MED ORDER — ENOXAPARIN SODIUM 40 MG/0.4ML ~~LOC~~ SOLN
40.0000 mg | SUBCUTANEOUS | Status: DC
  Administered 2015-09-30: 40 mg via SUBCUTANEOUS
  Filled 2015-09-30: qty 0.4

## 2015-09-30 MED ORDER — ASPIRIN EC 81 MG PO TBEC: 81.0000 mg | DELAYED_RELEASE_TABLET | Freq: Every day | ORAL | Status: DC

## 2015-09-30 MED ORDER — GABAPENTIN 300 MG PO CAPS
300.0000 mg | ORAL_CAPSULE | Freq: Three times a day (TID) | ORAL | Status: DC
  Administered 2015-09-30 – 2015-10-01 (×2): 300 mg via ORAL
  Filled 2015-09-30 (×2): qty 1

## 2015-09-30 MED ORDER — HEPARIN (PORCINE) IN NACL 100-0.45 UNIT/ML-% IJ SOLN
1200.0000 [IU]/h | INTRAMUSCULAR | Status: DC
  Administered 2015-10-01: 1200 [IU]/h via INTRAVENOUS
  Filled 2015-09-30 (×2): qty 250

## 2015-09-30 MED ORDER — HEPARIN BOLUS VIA INFUSION
2500.0000 [IU] | Freq: Once | INTRAVENOUS | Status: AC
  Administered 2015-09-30: 2500 [IU] via INTRAVENOUS
  Filled 2015-09-30: qty 2500

## 2015-09-30 MED ORDER — ENOXAPARIN SODIUM 40 MG/0.4ML ~~LOC~~ SOLN: 40.0000 mg | SUBCUTANEOUS | Status: DC

## 2015-09-30 MED ORDER — METOPROLOL TARTRATE 12.5 MG HALF TABLET
12.5000 mg | ORAL_TABLET | Freq: Two times a day (BID) | ORAL | Status: DC
  Administered 2015-09-30 – 2015-10-01 (×2): 12.5 mg via ORAL
  Filled 2015-09-30 (×2): qty 1

## 2015-09-30 MED ORDER — ALBUTEROL SULFATE (2.5 MG/3ML) 0.083% IN NEBU: 2.5000 mg | INHALATION_SOLUTION | Freq: Four times a day (QID) | RESPIRATORY_TRACT | Status: DC | PRN

## 2015-09-30 MED ORDER — OXYBUTYNIN CHLORIDE 5 MG PO TABS
5.0000 mg | ORAL_TABLET | Freq: Two times a day (BID) | ORAL | Status: DC
  Administered 2015-09-30 – 2015-10-01 (×2): 5 mg via ORAL
  Filled 2015-09-30 (×2): qty 1

## 2015-09-30 MED ORDER — PANTOPRAZOLE SODIUM 40 MG PO TBEC
40.0000 mg | DELAYED_RELEASE_TABLET | Freq: Every day | ORAL | Status: DC
  Administered 2015-09-30 – 2015-10-01 (×2): 40 mg via ORAL
  Filled 2015-09-30: qty 1

## 2015-09-30 MED ORDER — ONDANSETRON HCL 4 MG/2ML IJ SOLN: 4.0000 mg | Freq: Four times a day (QID) | INTRAMUSCULAR | Status: DC | PRN

## 2015-09-30 MED ORDER — ACETAMINOPHEN 325 MG PO TABS: 650.0000 mg | ORAL_TABLET | ORAL | Status: DC | PRN

## 2015-09-30 MED ORDER — METHOCARBAMOL 500 MG PO TABS
500.0000 mg | ORAL_TABLET | Freq: Four times a day (QID) | ORAL | Status: DC
  Administered 2015-09-30 – 2015-10-01 (×2): 500 mg via ORAL
  Filled 2015-09-30 (×2): qty 1

## 2015-09-30 MED ORDER — GI COCKTAIL ~~LOC~~: 30.0000 mL | Freq: Four times a day (QID) | ORAL | Status: DC | PRN

## 2015-09-30 NOTE — ED Provider Notes (Signed)
2:00 AM Patient's pain returned which she rated as a 7 out of 10. She was given 3 sublingual nitroglycerin with complete relief of pain. EKG is unchanged. We will have patient admitted.   EKG Interpretation  Date/Time:  Friday Sep 30 2015 01:21:06 EDT Ventricular Rate:  58 PR Interval:  160 QRS Duration: 98 QT Interval:  442 QTC Calculation: 434 R Axis:   23 Text Interpretation:  Sinus rhythm Normal EKG Unchanged Confirmed by Florina Ou  MD, Jenny Reichmann ([id number]) on 09/30/2015 1:33:48 AM        Shanon Rosser, MD 09/30/15 0201

## 2015-09-30 NOTE — Progress Notes (Signed)
ANTICOAGULATION CONSULT NOTE - Initial Consult  Pharmacy Consult for heparin Indication: pulmonary embolus suspected  Allergies  Allergen Reactions  . Oxycodone Hcl Diarrhea and Nausea And Vomiting  . Propoxyphene N-Acetaminophen Diarrhea and Nausea And Vomiting  . Tramadol Other (See Comments)    Cause migraines  . Adhesive [Tape] Rash and Other (See Comments)    Pulls skin off - please use paper tape  . Aspirin Other (See Comments)    ESOPHAGITIS  . Prednisone Rash    Patient Measurements: Height: 5\' 1"  (154.9 cm) Weight: 206 lb 12.7 oz (93.8 kg) IBW/kg (Calculated) : 47.8 Heparin Dosing Weight: 70kg  Vital Signs: Temp: 98.2 F (36.8 C) (05/12 2048) Temp Source: Oral (05/12 1500) BP: 126/75 mmHg (05/12 2048) Pulse Rate: 67 (05/12 2115)  Labs:  Recent Labs  09/29/15 2122 09/30/15 0020 09/30/15 1839  HGB 11.6*  --   --   HCT 34.7*  --   --   PLT 173  --   --   CREATININE 0.68  --   --   TROPONINI <0.03 <0.03 <0.03    Estimated Creatinine Clearance: 85 mL/min (by C-G formula based on Cr of 0.68).   Medical History: Past Medical History  Diagnosis Date  . Pulmonary embolism ([name])   . Presence of inferior vena cava filter   . PFO (patent foramen ovale)   . Status post gastric bypass for obesity   . Headache(784.0)     migraines  . Arthritis     osteoarthritis  . Urinary frequency   . Urinary incontinence   . Chronic back pain   . Sleep apnea     used CPAP until after bypass surg.  . Stroke ([place]) 12/2002    right-sided weakness  . DVT of upper extremity (deep vein thrombosis) ([place])   . History of shingles 07/2011  . Complication of anesthesia     states takes more than normal to put her to sleep  . Asthma     states no asthma attack since 2002  . Dental crowns present   . Dental bridge present     upper  . Fibromyalgia   . History of gallstones   . HTN (hypertension)   . History of pneumonia   . History of blood transfusion 06/2005     Assessment: 68 yof with suspected PE, history of DVT/PE s/p IVC filter, not on AC since 2009 per chart notes. Pharmacy consulted to dose heparin. Hg 11.6, plt wnl, no bleed documented. Given prophylactic dose of lovenox at ~2130 tonight, so will give ~1/2 bolus.  Goal of Therapy:  Heparin level 0.3-0.7 units/ml Monitor platelets by anticoagulation protocol: Yes   Plan:  Heparin 2500 unit bolus Start heparin at 1200 units/h 6h HL Daily HL/CBC Mon s/sx bleeding   Elicia Lamp, PharmD, [facility] Clinical Pharmacist Pager [phone] 09/30/2015 10:47 PM

## 2015-09-30 NOTE — ED Notes (Signed)
Report received from [place], [place]

## 2015-09-30 NOTE — H&P (Addendum)
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 Radiation of pain to the left arm and left side of the neck. She denied any nausea vomiting abdominal pain. Symptoms have been waxing and waning for the last 2 weeks with increasing frequency and duration. Symptoms not particularly related to physical activity. Troponin negative 2. Patient states that she has been hypertensive for the last couple of weeks. She had radiofrequency ablation to her lower back on 5/3 . During that procedure her systolic was [id number]. Patient's heart score is calculated to be around 5. Patient denies any fever chills or rigors,      Review of Systems: negative for the following  Constitutional: Denies fever, chills, diaphoresis, appetite change and fatigue.  HEENT: Denies photophobia, eye pain, redness, hearing loss, ear pain, congestion, sore throat, rhinorrhea, sneezing, mouth sores, trouble swallowing, neck pain, neck stiffness and tinnitus.  Respiratory: Denies SOB, DOE, cough, chest tightness, and wheezing.  Cardiovascular: Positive for chest pain, palpitations and leg swelling.  Gastrointestinal: Denies nausea, vomiting, abdominal pain, diarrhea, constipation, blood in stool and abdominal distention.  Genitourinary: Denies dysuria, urgency, frequency,  hematuria, flank pain and difficulty urinating.  Musculoskeletal: Denies myalgias, back pain, joint swelling, arthralgias and gait problem.  Skin: Denies pallor, rash and wound.  Neurological: Denies dizziness, seizures, syncope, weakness, light-headedness, numbness and headaches.  Hematological: Denies adenopathy. Easy bruising, personal or family bleeding history  Psychiatric/Behavioral: Denies suicidal ideation, mood changes, confusion, nervousness, sleep disturbance and agitation       Past Medical History  Diagnosis Date  . Pulmonary embolism ([place])   . Presence of inferior vena cava filter   . PFO (patent foramen ovale)   . Status post gastric bypass for obesity   . Headache(784.0)     migraines  . Arthritis     osteoarthritis  . Urinary frequency   . Urinary incontinence   . Chronic back pain   . Sleep apnea     used CPAP until after bypass surg.  . Stroke ([place]) 12/2002    right-sided weakness  . DVT of upper extremity (deep vein thrombosis) ([place])   . History of shingles 07/2011  . Complication of anesthesia     states takes more than normal to put her to sleep  . Asthma     states no asthma attack since 2002  . Dental crowns present   . Dental bridge present     upper  . Fibromyalgia   . History of gallstones   . HTN (hypertension)   . History of pneumonia   . History of blood transfusion 06/2005     Past Surgical History  Procedure Laterality Date  . Bunionectomy  05/1980    both feet  . Other surgical history  12/1986    pt states that she had surgery to unclog her fallopean tubes  . Cholecystectomy  1990  . Hemorrhoid surgery  03/1993  .  Uterine fibroid surgery  12/95, 7/96    x2  . Tonsillectomy  07/1995  . Heel spur surgery  08/1997    left  . Laparoscopic lysis intestinal adhesions  02/14/2000  . Shoulder surgery      bilat. - (left:  06/2005)  . Carpal tunnel release  06/21/2009    right  . Gastric roux-en-y  2009  . Cardiac catheterization   03/04/2002  . Anterior cervical decomp/discectomy fusion  02/05/2005    C5-6  . Trigger finger release  04/25/2006    decompression A-1 pulley left thumb  . Elbow surgery  08/09/2004    decompression ulnar nerve right elbow  . Enterolysis  10/22/2008    laparoscopic abd. enterolysis  . Appendectomy  10/22/2008    laparoscopic  . Nailbed repair  01/10/2005; 08/2011    exc. matrix bilat. great toe  . Abdominal hysterectomy  11/1997    complete  . Bunionectomy  08/2011    left foot  . Vena cava filter placement  2009    during Roux-en-Y surg.  . Carpal tunnel release      left hand  . Carpal tunnel release  10/03/2011    Procedure: CARPAL TUNNEL RELEASE;  Surgeon: Wynonia Sours, MD;  Location: [place];  Service: Orthopedics;  Laterality: Right;  CARPAL TUNNEL WITH HYPOTHENAR FAT PAD TRANSFER  . Cervical spine surgery  01/2005    titanium plate implanted      Social History:  reports that she has quit smoking. She has never used smokeless tobacco. She reports that she does not drink alcohol or use illicit drugs.    Allergies  Allergen Reactions  . Oxycodone Hcl Diarrhea and Nausea And Vomiting  . Propoxyphene N-Acetaminophen Diarrhea and Nausea And Vomiting  . Tramadol Other (See Comments)    Cause migraines  . Adhesive [Tape] Rash and Other (See Comments)    Pulls skin off - please use paper tape  . Aspirin Other (See Comments)    ESOPHAGITIS  . Prednisone Rash    Family History  Problem Relation Age of Onset  . Breast cancer Paternal Aunt   . Breast cancer Paternal Grandmother   . Breast cancer Maternal Grandmother   . Colon polyps Father   . Diabetes Father     borderline  . Hypertension Mother   . Hypertension          Prior to Admission medications   Medication Sig Start Date End Date Taking? Authorizing Provider  acetaminophen (TYLENOL) 500 MG tablet Take 1,000 mg by mouth every 6 (six) hours as needed (pain).   Yes Historical Provider, MD   albuterol (PROAIR HFA) 108 (90 Base) MCG/ACT inhaler Inhale 2 puffs into the lungs every 6 (six) hours as needed for wheezing or shortness of breath.   Yes Historical Provider, MD  Calcium Citrate-Vitamin D (CALCIUM CITRATE +D PO) Take 1 tablet by mouth daily. Calcium 600 mg   Yes Historical Provider, MD  gabapentin (NEURONTIN) 300 MG capsule Take 300 mg by mouth 3 (three) times daily.   Yes Historical Provider, MD  lisinopril (PRINIVIL,ZESTRIL) 20 MG tablet Take 20 mg by mouth daily.   Yes Historical Provider, MD  methocarbamol (ROBAXIN) 500 MG tablet Take 500 mg by mouth 4 (four) times daily.   Yes Historical Provider, MD  mometasone (ELOCON) 0.1 % cream Apply 1 application topically daily as needed (rash - summer eczema). Apply to arms   Yes Historical Provider, MD  Multiple Vitamin (MULTIVITAMIN WITH  MINERALS) TABS tablet Take 1 tablet by mouth at bedtime.   Yes Historical Provider, MD  Multiple Vitamins-Iron (MULTI-VITAMIN/IRON PO) Take 1 tablet by mouth daily.   Yes Historical Provider, MD  oxybutynin (DITROPAN) 5 MG tablet Take 5 mg by mouth 2 (two) times daily.   Yes Historical Provider, MD  silver sulfADIAZINE (SILVADENE) 1 % cream Apply 1 application topically 2 (two) times daily as needed (stomach tears).   Yes Historical Provider, MD  guaiFENesin-codeine 100-10 MG/5ML syrup Take 5 mLs by mouth every 6 (six) hours as needed for cough. Patient not taking: Reported on 09/30/2015 05/18/14   Ernestina Patches, MD  HYDROcodone-acetaminophen (NORCO/VICODIN) 5-325 MG per tablet Take 1 tablet by mouth every 4 (four) hours as needed. Patient not taking: Reported on 09/30/2015 02/15/14   Alfonzo Beers, MD     Physical Exam: Filed Vitals:   09/30/15 1100 09/30/15 1300 09/30/15 1340 09/30/15 1500  BP: 116/67 141/82 121/73 140/76  Pulse: 58 54 58 58  Temp:    98.2 F (36.8 C)  TempSrc:    Oral  Resp: 21 12 19 18   Height:    5\' 1"  (1.549 m)  Weight:    93.8 kg (206 lb 12.7 oz)  SpO2: 97% 96%  100% 99%      Constitutional: NAD, calm, comfortable Filed Vitals:   09/30/15 1100 09/30/15 1300 09/30/15 1340 09/30/15 1500  BP: 116/67 141/82 121/73 140/76  Pulse: 58 54 58 58  Temp:    98.2 F (36.8 C)  TempSrc:    Oral  Resp: 21 12 19 18   Height:    5\' 1"  (1.549 m)  Weight:    93.8 kg (206 lb 12.7 oz)  SpO2: 97% 96% 100% 99%   Eyes: PERRL, lids and conjunctivae normal ENMT: Mucous membranes are moist. Posterior pharynx clear of any exudate or lesions.Normal dentition.  Neck: normal, supple, no masses, no thyromegaly Respiratory: clear to auscultation bilaterally, no wheezing, no crackles. Normal respiratory effort. No accessory muscle use.  Cardiovascular: Regular rate and rhythm, Bradycardia,   no murmurs / rubs / gallops. No extremity edema. 2+ pedal pulses. No carotid bruits.  Abdomen: no tenderness, no masses palpated. No hepatosplenomegaly. Bowel sounds positive.  Musculoskeletal: no clubbing / cyanosis. No joint deformity upper and lower extremities. Good ROM, no contractures. Normal muscle tone.  Skin: no rashes, lesions, ulcers. No induration Neurologic: CN 2-12 grossly intact. Sensation intact, DTR normal. Strength 5/5 in all 4.  Psychiatric: Normal judgment and insight. Alert and oriented x 3. Normal mood.     Labs on Admission: I have personally reviewed following labs and imaging studies  CBC:  Recent Labs Lab 09/29/15 2122  WBC 5.1  NEUTROABS 1.6*  HGB 11.6*  HCT 34.7*  MCV 93.3  PLT [id number]    Basic Metabolic Panel:  Recent Labs Lab 09/29/15 2122  NA 137  K 3.7  CL 104  CO2 29  GLUCOSE 93  BUN 14  CREATININE 0.68  CALCIUM 9.1    GFR: Estimated Creatinine Clearance: 85 mL/min (by C-G formula based on Cr of 0.68).  Liver Function Tests:  Recent Labs Lab 09/29/15 2122  AST 18  ALT 15  ALKPHOS 57  BILITOT 0.7  PROT 7.3  ALBUMIN 4.1   No results for input(s): LIPASE, AMYLASE in the last 168 hours. No results for input(s): AMMONIA in  the last 168 hours.  Coagulation Profile: No results for input(s): INR, PROTIME in the last 168 hours. No results for input(s): DDIMER  in the last 72 hours.  Cardiac Enzymes:  Recent Labs Lab 09/29/15 2122 09/30/15 0020  TROPONINI <0.03 <0.03    BNP (last 3 results) No results for input(s): PROBNP in the last 8760 hours.  HbA1C: No results for input(s): HGBA1C in the last 72 hours. No results found for: HGBA1C   CBG: No results for input(s): GLUCAP in the last 168 hours.  Lipid Profile: No results for input(s): CHOL, HDL, LDLCALC, TRIG, CHOLHDL, LDLDIRECT in the last 72 hours.  Thyroid Function Tests: No results for input(s): TSH, T4TOTAL, FREET4, T3FREE, THYROIDAB in the last 72 hours.  Anemia Panel: No results for input(s): VITAMINB12, FOLATE, FERRITIN, TIBC, IRON, RETICCTPCT in the last 72 hours.  Urine analysis: No results found for: COLORURINE, APPEARANCEUR, LABSPEC, PHURINE, GLUCOSEU, HGBUR, BILIRUBINUR, KETONESUR, PROTEINUR, UROBILINOGEN, NITRITE, LEUKOCYTESUR  Sepsis Labs: @LABRCNTIP (procalcitonin:4,lacticidven:4) )No results found for this or any previous visit (from the past 240 hour(s)).       Radiological Exams on Admission: Dg Chest 2 View  09/29/2015  CLINICAL DATA:  Intermittent left-sided chest pain for 2 weeks, worse over the past few days. EXAM: CHEST  2 VIEW COMPARISON:  05/18/2014 FINDINGS: The lungs are clear. The pulmonary vasculature is normal. Heart size is normal. Hilar and mediastinal contours are unremarkable. There is no pleural effusion. IMPRESSION: No active cardiopulmonary disease. Electronically Signed   By: Andreas Newport M.D.   On: 09/29/2015 21:25   Dg Chest 2 View  09/29/2015  CLINICAL DATA:  Intermittent left-sided chest pain for 2 weeks, worse over the past few days. EXAM: CHEST  2 VIEW COMPARISON:  05/18/2014 FINDINGS: The lungs are clear. The pulmonary vasculature is normal. Heart size is normal. Hilar and mediastinal  contours are unremarkable. There is no pleural effusion. IMPRESSION: No active cardiopulmonary disease. Electronically Signed   By: Andreas Newport M.D.   On: 09/29/2015 21:25      EKG: Independently reviewed.  Date/Time: Friday Sep 30 2015 01:21:06 EDT Ventricular Rate: 58 PR Interval: 160 QRS Duration: 98 QT Interval: 442 QTC Calculation: 434 R Axis: 23 Text Interpretation: Sinus rhythm Normal EKG Unchanged   Assessment/Plan Principal Problem:   Chest pain, probable unstable angina Heart score of 5, discussed with Dr. Oval Linsey She recommends lexiscan myoview  in the morning Will start patient on aspirin, beta blocker, hold off on the heparin drip as cardiac enzymes have been negative 2 so far  If the cardiac stress test is low risk, patient may be discharged tomorrow if not then cardiology will formally consult  History of  DVT of upper extremity (deep vein thrombosis)/lower extremity (HCC)-check d-dimer as the patient recently had surgery last week on her back, extensive history of DVTs, if elevated will check CT angiogram of the chest to rule out pulmonary embolism, start on a heparin gtt as d-dimer significantly elevated pending further work up in terms of CT chest     Stroke (HCC)-patient states that her symptoms have been associated with a headache, prior history of stroke in the setting of PFO, therefore will check CT head without contrast, start patient on aspirin   Chronic back pain Patient to be continued on her muscle relaxant  DVT prophylaxis: Lovenox        Family Communication: discussed with patient , No family By the bedside   Disposition Plan:  Anticipate discharge tomorrow if stress is negative  Consults called: Dr. Oval Linsey  Admission status: Observation  Total time spent 55 minutes.Greater than 50% of this time was spent in  counseling, explanation of diagnosis, planning of further management, and coordination of care  [facility]  MD [facility] Pager [phone]  If 7PM-7AM, please contact night-coverage [email or web address] Password [facility]  09/30/2015, 5:57 PM

## 2015-09-30 NOTE — ED Notes (Signed)
I came to room to discharge pt and she stated that her CP started back at 0047.  7/10 at this time.  We discussed her options.  She has decided that she would like to do whatever is "medically necessary".  Since she is having CP again she is willing to talk about admission.  Would like to see the EDP to discuss this.  Will notify Dr. Florina Ou, who is the EDP at this time.

## 2015-09-30 NOTE — ED Notes (Signed)
Pt requesting medication for headache.  Denies CP at this time. Resting comfortably and quietly with sister at bedside.

## 2015-09-30 NOTE — Progress Notes (Signed)
[name] 55 yo F hx of HTN, stroke, PE not currently anticoagulated, per chart.  P/w 2 weeks worsening L sided CP, heartburn in character, with arm discomfort, diaphoresis, relieved with nitroglycerin.    CP OBS admission recommended initially, but pt refused reportedly and repeat TNI negative.  However, then CP returned, relieved with NTG again, and so patient requested admission.  EDP did not have suspicion for PE, dimer not done.  To tele OBS.

## 2015-09-30 NOTE — ED Notes (Signed)
Reports headache is decreasing. Multiple sisters at bedside.  Patient eating [place].  Stacy RN in to speak to patient at this time per patients request.

## 2015-09-30 NOTE — ED Notes (Signed)
Pt sleeping. Sister at bedside introduced self. No complaints at this time.  SB on monitor.  Will continue to monitor.

## 2015-09-30 NOTE — ED Notes (Signed)
MD at bedside discussing admission

## 2015-10-01 ENCOUNTER — Observation Stay (HOSPITAL_COMMUNITY): Payer: Medicare Other

## 2015-10-01 ENCOUNTER — Observation Stay (HOSPITAL_BASED_OUTPATIENT_CLINIC_OR_DEPARTMENT_OTHER): Payer: Medicare Other

## 2015-10-01 DIAGNOSIS — M7989 Other specified soft tissue disorders: Secondary | ICD-10-CM | POA: Diagnosis not present

## 2015-10-01 DIAGNOSIS — Z86718 Personal history of other venous thrombosis and embolism: Secondary | ICD-10-CM | POA: Diagnosis not present

## 2015-10-01 DIAGNOSIS — R079 Chest pain, unspecified: Secondary | ICD-10-CM

## 2015-10-01 DIAGNOSIS — I10 Essential (primary) hypertension: Secondary | ICD-10-CM

## 2015-10-01 DIAGNOSIS — R0789 Other chest pain: Secondary | ICD-10-CM | POA: Diagnosis not present

## 2015-10-01 LAB — CBC
HCT: 35.2 % — ABNORMAL LOW (ref 36.0–46.0)
Hemoglobin: 11.6 g/dL — ABNORMAL LOW (ref 12.0–15.0)
MCH: 30.9 pg (ref 26.0–34.0)
MCHC: 33 g/dL (ref 30.0–36.0)
MCV: 93.6 fL (ref 78.0–100.0)
Platelets: 161 10*3/uL (ref 150–400)
RBC: 3.76 MIL/uL — ABNORMAL LOW (ref 3.87–5.11)
RDW: 14 % (ref 11.5–15.5)
WBC: 4.8 10*3/uL (ref 4.0–10.5)

## 2015-10-01 LAB — TROPONIN I: Troponin I: <0.03 ng/mL (ref <0.031)

## 2015-10-01 LAB — COMPREHENSIVE METABOLIC PANEL
ALT: 12 U/L — ABNORMAL LOW (ref 14–54)
AST: 16 U/L (ref 15–41)
Albumin: 3.3 g/dL — ABNORMAL LOW (ref 3.5–5.0)
Alkaline Phosphatase: 51 U/L (ref 38–126)
Anion gap: 9 (ref 5–15)
BUN: 8 mg/dL (ref 6–20)
CO2: 29 mmol/L (ref 22–32)
Calcium: 9.1 mg/dL (ref 8.9–10.3)
Chloride: 104 mmol/L (ref 101–111)
Creatinine, Ser: 0.67 mg/dL (ref 0.44–1.00)
GFR calc Af Amer: >60 mL/min (ref >60)
GFR calc non Af Amer: >60 mL/min (ref >60)
Glucose, Bld: 94 mg/dL (ref 65–99)
Potassium: 3.8 mmol/L (ref 3.5–5.1)
Sodium: 142 mmol/L (ref 135–145)
Total Bilirubin: 0.8 mg/dL (ref 0.3–1.2)
Total Protein: 6.5 g/dL (ref 6.5–8.1)

## 2015-10-01 LAB — ECHOCARDIOGRAM COMPLETE
Height: 61 in
Weight: 3270.4 oz

## 2015-10-01 LAB — HEPARIN LEVEL (UNFRACTIONATED): Heparin Unfractionated: 1.23 IU/mL — ABNORMAL HIGH (ref 0.30–0.70)

## 2015-10-01 MED ORDER — TECHNETIUM TC 99M SESTAMIBI - CARDIOLITE
30.0000 | Freq: Once | INTRAVENOUS | Status: AC | PRN
  Administered 2015-10-01: 11:00:00 30 via INTRAVENOUS

## 2015-10-01 MED ORDER — HEPARIN (PORCINE) IN NACL 100-0.45 UNIT/ML-% IJ SOLN: 1000.0000 [IU]/h | INTRAMUSCULAR | Status: DC

## 2015-10-01 MED ORDER — REGADENOSON 0.4 MG/5ML IV SOLN
INTRAVENOUS | Status: AC
  Administered 2015-10-01: 0.4 mg
  Filled 2015-10-01: qty 5

## 2015-10-01 MED ORDER — TECHNETIUM TC 99M SESTAMIBI GENERIC - CARDIOLITE
10.0000 | Freq: Once | INTRAVENOUS | Status: AC | PRN
  Administered 2015-10-01: 10 via INTRAVENOUS

## 2015-10-01 NOTE — Progress Notes (Addendum)
Patient ID: [name], female   DOB: 1961/02/09, 55 y.o.   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 Pain radiated to the left arm and left side of the neck. Symptoms have been waxing and waning for the last 2 weeks with increasing frequency and duration.   Pt was hemodynamically stable on admission, troponin negative 2. Hear score around 5 so admitted for further evaluation of chest pain.   Assessment & Plan:   Principal Problem:   Chest pain - The troponin level WNL x 3 - The 12 lead EKG showed sinus rhythm - Appreciate cardio consult and recommendations; possible stress test - Continue daily aspirin - Stop heparin since CT angio chest and LE doppler all negative for PE or DVT respectively. No evidence of STEMI or NSTEMI - 2 D ECHO pending  Active Problems:   DVT of upper extremity (deep vein thrombosis) (HCC) - LE doppler negative for DVT - Has IV C filter     Essential hypertension - Continue metoprolol    DVT prophylaxis: was on heparin drip; stop it today since no PE, use SCD's Code Status: full code  Family Communication: no family at the bedside this am Disposition Plan: home if stress test negative    Consultants:   Cardiology   Procedures:   Stress test - 5/13  Antimicrobials:   None    Subjective: Has chest pressure although better since admission.   Objective: Filed Vitals:   10/01/15 1029 10/01/15 1101 10/01/15 1103 10/01/15 1104  BP: 135/90 149/75 143/76 146/75  Pulse: 46 80 72 69  Temp:        TempSrc:      Resp:      Height:      Weight:      SpO2:        Intake/Output Summary (Last 24 hours) at 10/01/15 1312 Last data filed at 10/01/15 0600  Gross per 24 hour  Intake  183.6 ml  Output      0 ml  Net  183.6 ml   Filed Weights   09/29/15 2021 09/30/15 1500 10/01/15 0500  Weight: 92.987 kg (205 lb) 93.8 kg (206 lb 12.7 oz) 92.715 kg (204 lb 6.4 oz)    Examination:  General exam: Appears calm and comfortable  Respiratory system: Clear to auscultation. Respiratory effort normal. Cardiovascular system: S1 & S2 heard, RRR. No JVD, murmurs, rubs, gallops or clicks. No pedal edema. Gastrointestinal system: Abdomen is nondistended, soft and nontender. No organomegaly or masses felt. Normal bowel sounds heard. Central nervous system: Alert and oriented. No focal neurological deficits. Extremities: Symmetric 5 x 5 power. Skin: No rashes, lesions or ulcers Psychiatry: Judgement and insight appear normal. Mood & affect appropriate.   Data Reviewed: I have personally reviewed following labs and imaging studies  CBC:  Recent Labs Lab 09/29/15 2122 10/01/15 0633  WBC 5.1 4.8  NEUTROABS 1.6*  --   HGB 11.6* 11.6*  HCT 34.7* 35.2*  MCV 93.3 93.6  PLT 173 [id number]   Basic Metabolic Panel:  Recent Labs Lab 09/29/15 2122 10/01/15 [id number]  NA 137 142  K 3.7 3.8  CL 104 104  CO2 29 29  GLUCOSE 93 94  BUN 14 8  CREATININE 0.68 0.67  CALCIUM 9.1 9.1   GFR: Estimated Creatinine Clearance: 84.5 mL/min (by C-G formula based on Cr of 0.67). Liver Function Tests:  Recent Labs Lab 09/29/15 2122 10/01/15 0633  AST 18 16  ALT 15 12*  ALKPHOS 57 51  BILITOT 0.7 0.8  PROT 7.3 6.5  ALBUMIN 4.1 3.3*   No results for input(s): LIPASE, AMYLASE in the last 168 hours. No results for input(s): AMMONIA in the last 168 hours. Coagulation Profile: No results for input(s): INR, PROTIME in the last 168 hours. Cardiac Enzymes:  Recent Labs Lab 09/29/15 2122 09/30/15 0020  09/30/15 1839 09/30/15 2156 10/01/15 0032  TROPONINI <0.03 <0.03 <0.03 <0.03 <0.03   BNP (last 3 results) No results for input(s): PROBNP in the last 8760 hours. HbA1C: No results for input(s): HGBA1C in the last 72 hours. CBG: No results for input(s): GLUCAP in the last 168 hours. Lipid Profile:  Recent Labs  09/30/15 1838  CHOL 199  HDL 77  LDLCALC 110*  TRIG 58  CHOLHDL 2.6   Thyroid Function Tests: No results for input(s): TSH, T4TOTAL, FREET4, T3FREE, THYROIDAB in the last 72 hours. Anemia Panel: No results for input(s): VITAMINB12, FOLATE, FERRITIN, TIBC, IRON, RETICCTPCT in the last 72 hours. Urine analysis: No results found for: COLORURINE, APPEARANCEUR, LABSPEC, PHURINE, GLUCOSEU, HGBUR, BILIRUBINUR, KETONESUR, PROTEINUR, UROBILINOGEN, NITRITE, LEUKOCYTESUR Sepsis Labs: @LABRCNTIP (procalcitonin:4,lacticidven:4)   )No results found for this or any previous visit (from the past 240 hour(s)).    Radiology Studies: Dg Chest 2 View 09/29/2015   No active cardiopulmonary disease.   Ct Head Wo Contrast 09/30/2015  Negative noncontrast head CT.  Ct Angio Chest Pe W/cm &/or Wo Cm 10/01/2015  No CT evidence of pulmonary embolism.     Scheduled Meds: . aspirin EC  325 mg Oral Daily  . gabapentin  300 mg Oral TID  . methocarbamol  500 mg Oral QID  . metoprolol tartrate  12.5 mg Oral BID  . oxybutynin  5 mg Oral BID  . pantoprazole  40 mg Oral Daily   Continuous Infusions: . heparin 1,000 Units/hr (10/01/15 0910)      Time spent: 15 minutes  Greater than 50% of the time spent on counseling and coordinating the care.   Leisa Lenz, MD [facility] Pager [phone]  If 7PM-7AM, please contact night-coverage [email or web address] Password [email or web address] 10/01/2015, 1:12 PM

## 2015-10-01 NOTE — Progress Notes (Signed)
VASCULAR LAB PRELIMINARY  PRELIMINARY  PRELIMINARY  PRELIMINARY  Bilateral lower extremity venous duplex  completed.    Preliminary report:  Bilateral:  No evidence of DVT, superficial thrombosis, or Baker's Cyst.    Rashmi Tallent, RVT 10/01/2015, 12:31 PM

## 2015-10-01 NOTE — Discharge Summary (Signed)
Physician Discharge Summary  [name] [id number] DOB: 1960/12/14 DOA: 09/29/2015  PCP: [name], MD  Admit date: 09/29/2015 Discharge date: 10/01/2015  Recommendations for Outpatient Follow-up:  1. 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 Pain radiated to the left arm and left side of the neck. Symptoms have been waxing and waning for the last 2 weeks with increasing frequency and duration.   Pt was hemodynamically stable on admission, troponin negative 2. Hear score around 5 so admitted for further evaluation of chest pain.   Hospital Course:   Assessment & Plan:  Principal Problem:  Chest pain - The troponin level WNL x 3 - The 12 lead EKG showed sinus rhythm - Stress test - low risk study  - Stopped heparin since CT angio chest and LE doppler all negative for PE or DVT respectively. No evidence of STEMI or NSTEMI - 2 D ECHO - EF 60%  Active Problems:  DVT of upper extremity (deep vein thrombosis) (HCC) - LE doppler negative for DVT - Has IV C filter    Essential hypertension - BP 137/53; no need for metoprolol on discharge, HR 57 - Outpt monitoring of BP   DVT prophylaxis: was on heparin drip; stopped it today since no PE, use SCD's Code Status: full code  Family Communication: no family at the bedside this am   Consultants:    Cardiology  Procedures:   Stress test - 5/13  Antimicrobials:   None   Signed:  Leisa Lenz, MD  [facility] 10/01/2015, 4:48 PM  Pager #: [phone]     Discharge Exam: Filed Vitals:   10/01/15 1104 10/01/15 1448  BP: 146/75 137/53  Pulse: 69 57  Temp:  98.2 F (36.8 C)  Resp:     Filed Vitals:   10/01/15 1101 10/01/15 1103 10/01/15 1104 10/01/15 1448  BP: 149/75 143/76 146/75 137/53  Pulse: 80 72 69 57  Temp:    98.2 F (36.8 C)  TempSrc:    Oral  Resp:      Height:      Weight:      SpO2:    100%    General: Pt is alert, follows commands appropriately, not in acute distress Cardiovascular: Regular rate and rhythm, S1/S2 +, no murmurs Respiratory: Clear to auscultation bilaterally, no wheezing, no crackles, no rhonchi Abdominal: Soft, non tender, non distended, bowel sounds +, no guarding Extremities: no edema, no cyanosis, pulses palpable bilaterally DP and PT Neuro: Grossly nonfocal  Discharge Instructions  Discharge Instructions    Call MD for:  hives    Complete by:  As directed      Call MD for:  persistant dizziness or light-headedness    Complete by:  As directed      Call MD for:  persistant nausea and vomiting    Complete by:  As directed      Call MD for:  redness, tenderness, or signs of infection (pain, swelling, redness, odor or green/yellow discharge around incision site)    Complete by:  As directed      Diet - low sodium heart healthy    Complete by:  As directed      Discharge instructions    Complete by:  As directed   Stress test - Low risk study  Cardiac enzymes within normal limit     Increase activity slowly    Complete by:  As directed             Medication List    STOP taking these medications        guaiFENesin-codeine 100-10 MG/5ML syrup     HYDROcodone-acetaminophen 5-325 MG tablet  Commonly known as:  NORCO/VICODIN      TAKE these medications        acetaminophen 500 MG tablet   Commonly known as:  TYLENOL  Take 1,000 mg by mouth every 6 (six) hours as needed (pain).     CALCIUM CITRATE +D PO  Take 1 tablet by mouth daily. Calcium 600 mg     gabapentin 300 MG capsule  Commonly known as:  NEURONTIN  Take 300 mg by mouth 3 (three) times daily.     lisinopril 20 MG tablet  Commonly known as:  PRINIVIL,ZESTRIL  Take 20 mg by mouth daily.     methocarbamol 500 MG tablet  Commonly known as:  ROBAXIN  Take 500 mg by mouth 4 (four) times daily.     mometasone 0.1 % cream  Commonly known as:  ELOCON  Apply 1 application topically daily as needed (rash - summer eczema). Apply to arms     MULTI-VITAMIN/IRON PO  Take 1 tablet by mouth daily.     multivitamin with minerals Tabs tablet  Take 1 tablet by mouth at bedtime.     oxybutynin 5 MG tablet  Commonly known as:  DITROPAN  Take 5 mg by mouth 2 (two) times daily.     PROAIR HFA 108 (90 Base) MCG/ACT inhaler  Generic drug:  albuterol  Inhale 2 puffs into the lungs every 6 (six) hours as needed for wheezing or shortness of breath.     silver sulfADIAZINE 1 % cream  Commonly known as:  SILVADENE  Apply 1 application topically 2 (two) times daily as needed (stomach tears).           Follow-up Information    Schedule an appointment as soon as possible for a visit with Kirk Ruths, MD.   Specialty:  Cardiology   Contact information:   [address] [name] [address] [phone]       Follow up with [name], MD. Schedule an appointment as soon as possible for a visit in 1 week.   Specialty:  Family Medicine   Why:  Follow up appt after recent hospitalization   Contact information:   [place] [address] [phone]        The results of significant diagnostics from this hospitalization (including imaging, microbiology, ancillary and laboratory) are listed below for reference.    Significant Diagnostic Studies: Dg Chest 2  View  09/29/2015  CLINICAL DATA:  Intermittent left-sided chest pain for 2 weeks, worse over the past few days. EXAM: CHEST  2 VIEW COMPARISON:  05/18/2014 FINDINGS: The lungs are clear. The pulmonary vasculature is normal. Heart size is normal. Hilar and mediastinal contours are unremarkable. There is no pleural effusion.  IMPRESSION: No active cardiopulmonary disease. Electronically Signed   By: Andreas Newport M.D.   On: 09/29/2015 21:25   Ct Head Wo Contrast  09/30/2015  CLINICAL DATA:  Frontal headache for 2 days.  Hypertension. EXAM: CT HEAD WITHOUT CONTRAST TECHNIQUE: Contiguous axial images were obtained from the base of the skull through the vertex without intravenous contrast. COMPARISON:  None. FINDINGS: No evidence of intracranial hemorrhage, brain edema, or other signs of acute infarction. No evidence of intracranial mass lesion or mass effect. No abnormal extraaxial fluid collections identified. Ventricles are normal in size. No skull abnormality identified. IMPRESSION: Negative noncontrast head CT. Electronically Signed   By: Earle Gell M.D.   On: 09/30/2015 19:42   Ct Angio Chest Pe W/cm &/or Wo Cm  10/01/2015  CLINICAL DATA:  55 year old female with chest pain EXAM: CT ANGIOGRAPHY CHEST WITH CONTRAST TECHNIQUE: Multidetector CT imaging of the chest was performed using the standard protocol during bolus administration of intravenous contrast. Multiplanar CT image reconstructions and MIPs were obtained to evaluate the vascular anatomy. CONTRAST:  100 cc Isovue 370 COMPARISON:  Chest radiograph dated 09/29/2015 FINDINGS: Minimal bibasilar dependent atelectatic changes of the lungs. The lungs are otherwise clear. There is no pleural effusion or pneumothorax. The central airways are patent. The thoracic aorta appears unremarkable. No CT evidence of pulmonary embolism. There is mild cardiomegaly. No pericardial effusion. No hilar or mediastinal adenopathy. The esophagus and the thyroid gland  appear unremarkable. There is no axillary adenopathy. The chest wall soft tissues appear unremarkable. There is mild degenerative changes of the spine. No acute fracture. Cholecystectomy. Postsurgical changes of gastric bypass. The visualized upper abdomen is otherwise unremarkable. Review of the MIP images confirms the above findings. IMPRESSION: No CT evidence of pulmonary embolism. Electronically Signed   By: Anner Crete M.D.   On: 10/01/2015 00:02   Nm Myocar Multi W/spect W/wall Motion / Ef  10/01/2015  CLINICAL DATA:  Acute left chest pain intermittently for 2 weeks EXAM: MYOCARDIAL IMAGING WITH SPECT (REST AND PHARMACOLOGIC-STRESS) GATED LEFT VENTRICULAR WALL MOTION STUDY LEFT VENTRICULAR EJECTION FRACTION TECHNIQUE: Standard myocardial SPECT imaging was performed after resting intravenous injection of 10 mCi Tc-25m sestamibi. Subsequently, intravenous infusion of Lexiscan was performed under the supervision of the Cardiology staff. At peak effect of the drug, 30 mCi Tc-77m sestamibi was injected intravenously and standard myocardial SPECT imaging was performed. Quantitative gated imaging was also performed to evaluate left ventricular wall motion, and estimate left ventricular ejection fraction. COMPARISON:  None. FINDINGS: Perfusion: Allowing for inferior attenuation artifact, No significant decreased activity in the left ventricle on stress imaging to suggest reversible ischemia or infarction. Wall Motion: Normal left ventricular wall motion. No left ventricular dilation. Left Ventricular Ejection Fraction: 56 % End diastolic volume [id number] ml End systolic volume 50 ml IMPRESSION: 1. No reversible ischemia or infarction. 2. Normal left ventricular wall motion. 3. Left ventricular ejection fraction 56% 4. Low-risk stress test findings*. *2012 Appropriate Use Criteria for Coronary Revascularization Focused Update: J Am Coll Cardiol. [id number].  http://content.[email or web address].aspx?articleid=[email or web address] Electronically Signed   By: Jerilynn Mages.  Shick M.D.   On: 10/01/2015 16:03    Microbiology: No results found for this or any previous visit (from the past 240 hour(s)).   Labs: Basic Metabolic Panel:  Recent Labs Lab 09/29/15 2122 10/01/15 0633  NA 137 142  K 3.7 3.8  CL 104 104  CO2 29 29  GLUCOSE 93 94  BUN 14 8  CREATININE 0.68 0.67  CALCIUM 9.1 9.1  Liver Function Tests:  Recent Labs Lab 09/29/15 2122 10/01/15 0633  AST 18 16  ALT 15 12*  ALKPHOS 57 51  BILITOT 0.7 0.8  PROT 7.3 6.5  ALBUMIN 4.1 3.3*   No results for input(s): LIPASE, AMYLASE in the last 168 hours. No results for input(s): AMMONIA in the last 168 hours. CBC:  Recent Labs Lab 09/29/15 2122 10/01/15 0633  WBC 5.1 4.8  NEUTROABS 1.6*  --   HGB 11.6* 11.6*  HCT 34.7* 35.2*  MCV 93.3 93.6  PLT 173 161   Cardiac Enzymes:  Recent Labs Lab 09/29/15 2122 09/30/15 0020 09/30/15 1839 09/30/15 2156 10/01/15 0032  TROPONINI <0.03 <0.03 <0.03 <0.03 <0.03   BNP: BNP (last 3 results) No results for input(s): BNP in the last 8760 hours.  ProBNP (last 3 results) No results for input(s): PROBNP in the last 8760 hours.  CBG: No results for input(s): GLUCAP in the last 168 hours.

## 2015-10-01 NOTE — Progress Notes (Signed)
  Echocardiogram 2D Echocardiogram has been performed.  Jennette Dubin 10/01/2015, 12:45 PM

## 2015-10-01 NOTE — Progress Notes (Signed)
ANTICOAGULATION CONSULT NOTE - Initial Consult  Pharmacy Consult for heparin Indication: pulmonary embolus suspected  Allergies  Allergen Reactions  . Oxycodone Hcl Diarrhea and Nausea And Vomiting  . Propoxyphene N-Acetaminophen Diarrhea and Nausea And Vomiting  . Tramadol Other (See Comments)    Cause migraines  . Adhesive [Tape] Rash and Other (See Comments)    Pulls skin off - please use paper tape  . Aspirin Other (See Comments)    ESOPHAGITIS  . Prednisone Rash    Patient Measurements: Height: 5\' 1"  (154.9 cm) Weight: 204 lb 6.4 oz (92.715 kg) IBW/kg (Calculated) : 47.8 Heparin Dosing Weight: 70kg  Vital Signs: Temp: 98 F (36.7 C) (05/13 0500) BP: 137/57 mmHg (05/13 0500) Pulse Rate: 54 (05/13 0500)  Labs:  Recent Labs  09/29/15 2122  09/30/15 1839 09/30/15 2156 10/01/15 0032 10/01/15 0633  HGB 11.6*  --   --   --   --  11.6*  HCT 34.7*  --   --   --   --  35.2*  PLT 173  --   --   --   --  161  HEPARINUNFRC  --   --   --   --   --  1.23*  CREATININE 0.68  --   --   --   --  0.67  TROPONINI <0.03  < > <0.03 <0.03 <0.03  --   < > = values in this interval not displayed.  Estimated Creatinine Clearance: 84.5 mL/min (by C-G formula based on Cr of 0.67).   Medical History: Past Medical History  Diagnosis Date  . Pulmonary embolism ([place])   . Presence of inferior vena cava filter   . PFO (patent foramen ovale)   . Status post gastric bypass for obesity   . Headache(784.0)     migraines  . Arthritis     osteoarthritis  . Urinary frequency   . Urinary incontinence   . Chronic back pain   . Sleep apnea     used CPAP until after bypass surg.  . Stroke ([place]) 12/2002    right-sided weakness  . DVT of upper extremity (deep vein thrombosis) ([place])   . History of shingles 07/2011  . Complication of anesthesia     states takes more than normal to put her to sleep  . Asthma     states no asthma attack since 2002  . Dental crowns present   . Dental bridge  present     upper  . Fibromyalgia   . History of gallstones   . HTN (hypertension)   . History of pneumonia   . History of blood transfusion 06/2005    Assessment: 37 yof admitted 09/29/2015 with CP. Pharmacy consulted to start heparin for suspected PE.  PMH HTN, hx CVA, hx VTE s/p inferior vena cava filter, morbid obesity,Roux-en-Y gastric bypass surgery (05/2007), fibromyalgia, chronic pelvic pain, recent radiofrequency ablation to lower back (09/2015)  HL supratherapeutic to 1.23 on heparin 1200 units/h. H/H, plt stable from yesterday. D-dimer 4.53 and CT chest yesterday negative for PE. Consider discontinuation of heparin d/t neg PE findings.      Goal of Therapy:  Heparin level 0.3-0.7 units/ml Monitor platelets by anticoagulation protocol: Yes   Plan:  Hold heparin x 1 hour Restart heparin at 1000 units/h if heparin still indicated Monitor s/sx bleeding   Heloise Ochoa, Pharm.D., BCPS PGY2 Cardiology Pharmacy Resident Pager: [phone]  10/01/2015 8:02 AM

## 2015-10-01 NOTE — Discharge Instructions (Signed)
Nonspecific Chest Pain  °Chest pain can be caused by many different conditions. There is always a chance that your pain could be related to something serious, such as a heart attack or a blood clot in your lungs. Chest pain can also be caused by conditions that are not life-threatening. If you have chest pain, it is very important to follow up with your health care provider. °CAUSES  °Chest pain can be caused by: °· Heartburn. °· Pneumonia or bronchitis. °· Anxiety or stress. °· Inflammation around your heart (pericarditis) or lung (pleuritis or pleurisy). °· A blood clot in your lung. °· A collapsed lung (pneumothorax). It can develop suddenly on its own (spontaneous pneumothorax) or from trauma to the chest. °· Shingles infection (varicella-zoster virus). °· Heart attack. °· Damage to the bones, muscles, and cartilage that make up your chest wall. This can include: °¨ Bruised bones due to injury. °¨ Strained muscles or cartilage due to frequent or repeated coughing or overwork. °¨ Fracture to one or more ribs. °¨ Sore cartilage due to inflammation (costochondritis). °RISK FACTORS  °Risk factors for chest pain may include: °· Activities that increase your risk for trauma or injury to your chest. °· Respiratory infections or conditions that cause frequent coughing. °· Medical conditions or overeating that can cause heartburn. °· Heart disease or family history of heart disease. °· Conditions or health behaviors that increase your risk of developing a blood clot. °· Having had chicken pox (varicella zoster). °SIGNS AND SYMPTOMS °Chest pain can feel like: °· Burning or tingling on the surface of your chest or deep in your chest. °· Crushing, pressure, aching, or squeezing pain. °· Dull or sharp pain that is worse when you move, cough, or take a deep breath. °· Pain that is also felt in your back, neck, shoulder, or arm, or pain that spreads to any of these areas. °Your chest pain may come and go, or it may stay  constant. °DIAGNOSIS °Lab tests or other studies may be needed to find the cause of your pain. Your health care provider may have you take a test called an ambulatory ECG (electrocardiogram). An ECG records your heartbeat patterns at the time the test is performed. You may also have other tests, such as: °· Transthoracic echocardiogram (TTE). During echocardiography, sound waves are used to create a picture of all of the heart structures and to look at how blood flows through your heart. °· Transesophageal echocardiogram (TEE). This is a more advanced imaging test that obtains images from inside your body. It allows your health care provider to see your heart in finer detail. °· Cardiac monitoring. This allows your health care provider to monitor your heart rate and rhythm in real time. °· Holter monitor. This is a portable device that records your heartbeat and can help to diagnose abnormal heartbeats. It allows your health care provider to track your heart activity for several days, if needed. °· Stress tests. These can be done through exercise or by taking medicine that makes your heart beat more quickly. °· Blood tests. °· Imaging tests. °TREATMENT  °Your treatment depends on what is causing your chest pain. Treatment may include: °· Medicines. These may include: °¨ Acid blockers for heartburn. °¨ Anti-inflammatory medicine. °¨ Pain medicine for inflammatory conditions. °¨ Antibiotic medicine, if an infection is present. °¨ Medicines to dissolve blood clots. °¨ Medicines to treat coronary artery disease. °· Supportive care for conditions that do not require medicines. This may include: °¨ Resting. °¨ Applying heat   or cold packs to injured areas. °¨ Limiting activities until pain decreases. °HOME CARE INSTRUCTIONS °· If you were prescribed an antibiotic medicine, finish it all even if you start to feel better. °· Avoid any activities that bring on chest pain. °· Do not use any tobacco products, including  cigarettes, chewing tobacco, or electronic cigarettes. If you need help quitting, ask your health care provider. °· Do not drink alcohol. °· Take medicines only as directed by your health care provider. °· Keep all follow-up visits as directed by your health care provider. This is important. This includes any further testing if your chest pain does not go away. °· If heartburn is the cause for your chest pain, you may be told to keep your head raised (elevated) while sleeping. This reduces the chance that acid will go from your stomach into your esophagus. °· Make lifestyle changes as directed by your health care provider. These may include: °¨ Getting regular exercise. Ask your health care provider to suggest some activities that are safe for you. °¨ Eating a heart-healthy diet. A registered dietitian can help you to learn healthy eating options. °¨ Maintaining a healthy weight. °¨ Managing diabetes, if necessary. °¨ Reducing stress. °SEEK MEDICAL CARE IF: °· Your chest pain does not go away after treatment. °· You have a rash with blisters on your chest. °· You have a fever. °SEEK IMMEDIATE MEDICAL CARE IF:  °· Your chest pain is worse. °· You have an increasing cough, or you cough up blood. °· You have severe abdominal pain. °· You have severe weakness. °· You faint. °· You have chills. °· You have sudden, unexplained chest discomfort. °· You have sudden, unexplained discomfort in your arms, back, neck, or jaw. °· You have shortness of breath at any time. °· You suddenly start to sweat, or your skin gets clammy. °· You feel nauseous or you vomit. °· You suddenly feel light-headed or dizzy. °· Your heart begins to beat quickly, or it feels like it is skipping beats. °These symptoms may represent a serious problem that is an emergency. Do not wait to see if the symptoms will go away. Get medical help right away. Call your local emergency services (911 in the [place] Do not drive yourself to the hospital. °  °This  information is not intended to replace advice given to you by your health care provider. Make sure you discuss any questions you have with your health care provider. °  °Document Released: 02/14/2005 Document Revised: 05/28/2014 Document Reviewed: 12/11/2013 °Elsevier Interactive Patient Education ©2016 [employer] ° °

## 2015-10-05 DIAGNOSIS — I10 Essential (primary) hypertension: Secondary | ICD-10-CM | POA: Diagnosis not present

## 2015-10-05 DIAGNOSIS — K219 Gastro-esophageal reflux disease without esophagitis: Secondary | ICD-10-CM | POA: Diagnosis not present

## 2015-10-05 MED FILL — OMEPRAZOLE DR 20 MG CAPSULE: 20 | 30 days supply | Qty: 30 | Fill #0

## 2015-10-05 MED FILL — raNITIdine HCL 300 MG TABS: 300 | 30 days supply | Qty: 30 | Fill #0

## 2015-10-19 MED FILL — OXYBUTYNIN 5 MG TABLET: 5 | 30 days supply | Qty: 60 | Fill #1

## 2015-10-19 MED FILL — LISINOPRIL 20 MG TABLET: 20 | 90 days supply | Qty: 90 | Fill #0

## 2015-10-20 DIAGNOSIS — M47816 Spondylosis without myelopathy or radiculopathy, lumbar region: Secondary | ICD-10-CM | POA: Diagnosis not present

## 2015-10-20 DIAGNOSIS — M5136 Other intervertebral disc degeneration, lumbar region: Secondary | ICD-10-CM | POA: Diagnosis not present

## 2015-10-20 DIAGNOSIS — M545 Low back pain: Secondary | ICD-10-CM | POA: Diagnosis not present

## 2015-11-07 MED FILL — raNITIdine HCL 300 MG TABS: 300 | 30 days supply | Qty: 30 | Fill #1

## 2015-11-07 MED FILL — OMEPRAZOLE DR 20 MG CAPSULE: 20 | 30 days supply | Qty: 30 | Fill #1

## 2015-11-14 DIAGNOSIS — K219 Gastro-esophageal reflux disease without esophagitis: Secondary | ICD-10-CM | POA: Diagnosis not present

## 2015-11-24 MED FILL — OXYBUTYNIN 5 MG TABLET: 5 | 30 days supply | Qty: 60 | Fill #2

## 2015-11-24 MED FILL — METHOCARBAMOL 500 MG TABLET: 500 | 30 days supply | Qty: 120 | Fill #1

## 2015-11-24 MED FILL — GABAPENTIN 300 MG CAPSULE: 300 | 30 days supply | Qty: 90 | Fill #2

## 2015-11-25 ENCOUNTER — Ambulatory Visit: Payer: Medicare Other | Admitting: Cardiology

## 2015-11-30 MED FILL — raNITIdine HCL 300 MG TABS: 300 | 30 days supply | Qty: 30 | Fill #2

## 2015-11-30 MED FILL — OMEPRAZOLE DR 20 MG CAPSULE: 20 | 30 days supply | Qty: 30 | Fill #2

## 2015-12-08 ENCOUNTER — Emergency Department (HOSPITAL_BASED_OUTPATIENT_CLINIC_OR_DEPARTMENT_OTHER)
Admission: EM | Admit: 2015-12-08 | Discharge: 2015-12-08 | Disposition: A | Discharge status: Home / Self Care | Payer: Medicare Other | Attending: Emergency Medicine | Admitting: Emergency Medicine

## 2015-12-08 ENCOUNTER — Encounter (HOSPITAL_BASED_OUTPATIENT_CLINIC_OR_DEPARTMENT_OTHER): Payer: Self-pay

## 2015-12-08 ENCOUNTER — Emergency Department (HOSPITAL_BASED_OUTPATIENT_CLINIC_OR_DEPARTMENT_OTHER): Payer: Medicare Other

## 2015-12-08 DIAGNOSIS — J45909 Unspecified asthma, uncomplicated: Secondary | ICD-10-CM | POA: Diagnosis not present

## 2015-12-08 DIAGNOSIS — M179 Osteoarthritis of knee, unspecified: Secondary | ICD-10-CM | POA: Diagnosis not present

## 2015-12-08 DIAGNOSIS — Z8673 Personal history of transient ischemic attack (TIA), and cerebral infarction without residual deficits: Secondary | ICD-10-CM | POA: Insufficient documentation

## 2015-12-08 DIAGNOSIS — Z87891 Personal history of nicotine dependence: Secondary | ICD-10-CM | POA: Diagnosis not present

## 2015-12-08 DIAGNOSIS — M1990 Unspecified osteoarthritis, unspecified site: Secondary | ICD-10-CM | POA: Diagnosis not present

## 2015-12-08 DIAGNOSIS — I10 Essential (primary) hypertension: Secondary | ICD-10-CM | POA: Diagnosis not present

## 2015-12-08 DIAGNOSIS — M25562 Pain in left knee: Secondary | ICD-10-CM | POA: Diagnosis not present

## 2015-12-08 MED ORDER — KETOROLAC TROMETHAMINE 60 MG/2ML IM SOLN
60.0000 mg | Freq: Once | INTRAMUSCULAR | Status: AC
Start: 2015-12-08 — End: 2015-12-08
  Administered 2015-12-08: 60 mg via INTRAMUSCULAR
  Filled 2015-12-08: qty 2

## 2015-12-08 NOTE — ED Provider Notes (Signed)
CSN: [id number]     Arrival date & time 12/08/15  1918 History  By signing my name below, I, [name], attest that this documentation has been prepared under the direction and in the presence of [name], PA-C. Electronically Signed: Gwenlyn [name], ED Scribe. 12/08/2015. 8:22 PM.   Chief Complaint  Patient presents with  . Knee Pain   The history is provided by the patient. No language interpreter was used.    HPI Comments: [name] is a 55 y.o. female with PMHx of PE, DVT, Stroke, and HTN who presents to the Emergency Department complaining of gradual worsening, moderate, intermittent exacerbation of chronic left knee pain onset 2 weeks ago. Pt states she went walking 2 weeks ago and then woke up the following morning and it was sore. Pt states it has started to gradually worsen and ache more last night. She reports using extra strength Tylenol, Gabapentin and Robaxin with no relief of symptoms. Pt states she uses a cane at baseline due to right sided deficits from her stroke. Pt states she has not seen an orthopedic surgeon, but has an appointment for January 13, 2016 with [facility]. Patient denies neuro deficits, falls/injuries, fever/chills, or any other complaints.    Past Medical History  Diagnosis Date  . Pulmonary embolism ([place])   . Presence of inferior vena cava filter   . PFO (patent foramen ovale)   . Status post gastric bypass for obesity   . Headache(784.0)     migraines  . Arthritis     osteoarthritis  . Urinary frequency   . Urinary incontinence   . Chronic back pain   . Sleep apnea     used CPAP until after bypass surg.  . Stroke ([place]) 12/2002    right-sided weakness  . DVT of upper extremity (deep vein thrombosis) ([place])   . History of shingles 07/2011  . Complication of anesthesia     states takes more than normal to put her to sleep  . Asthma     states no asthma attack since 2002  . Dental crowns present   . Dental bridge present      upper  . Fibromyalgia   . History of gallstones   . HTN (hypertension)   . History of pneumonia   . History of blood transfusion 06/2005   Past Surgical History  Procedure Laterality Date  . Bunionectomy  05/1980    both feet  . Other surgical history  12/1986    pt states that she had surgery to unclog her fallopean tubes  . Cholecystectomy  1990  . Hemorrhoid surgery  03/1993  . Uterine fibroid surgery  12/95, 7/96    x2  . Tonsillectomy  07/1995  . Heel spur surgery  08/1997    left  . Laparoscopic lysis intestinal adhesions  02/14/2000  . Shoulder surgery      bilat. - (left:  06/2005)  . Carpal tunnel release  06/21/2009    right  . Gastric roux-en-y  2009  . Cardiac catheterization  03/04/2002  . Anterior cervical decomp/discectomy fusion  02/05/2005    C5-6  . Trigger finger release  04/25/2006    decompression A-1 pulley left thumb  . Elbow surgery  08/09/2004    decompression ulnar nerve right elbow  . Enterolysis  10/22/2008    laparoscopic abd. enterolysis  . Appendectomy  10/22/2008    laparoscopic  . Nailbed repair  01/10/2005; 08/2011    exc. matrix bilat. great  toe  . Abdominal hysterectomy  11/1997    complete  . Bunionectomy  08/2011    left foot  . Vena cava filter placement  2009    during Roux-en-Y surg.  . Carpal tunnel release      left hand  . Carpal tunnel release  10/03/2011    Procedure: CARPAL TUNNEL RELEASE;  Surgeon: Wynonia Sours, MD;  Location: [place];  Service: Orthopedics;  Laterality: Right;  CARPAL TUNNEL WITH HYPOTHENAR FAT PAD TRANSFER  . Cervical spine surgery  01/2005    titanium plate implanted   Family History  Problem Relation Age of Onset  . Breast cancer Paternal Aunt   . Breast cancer Paternal Grandmother   . Breast cancer Maternal Grandmother   . Colon polyps Father   . Diabetes Father     borderline  . Hypertension Mother   . Hypertension     Social History  Substance Use Topics  . Smoking status: Former  Research scientist (life sciences)  . Smokeless tobacco: Never Used     Comment: quit smoking 08/1989  . Alcohol Use: No   OB History    No data available     Review of Systems  Constitutional: Negative for fever and chills.  Musculoskeletal: Positive for arthralgias.  Neurological: Negative for weakness and numbness.    Allergies  Oxycodone hcl; Propoxyphene n-acetaminophen; Tramadol; Adhesive; Aspirin; and Prednisone  Home Medications   Prior to Admission medications   Medication Sig Start Date End Date Taking? Authorizing Provider  acetaminophen (TYLENOL) 500 MG tablet Take 1,000 mg by mouth every 6 (six) hours as needed (pain).    Historical Provider, MD  albuterol (PROAIR HFA) 108 (90 Base) MCG/ACT inhaler Inhale 2 puffs into the lungs every 6 (six) hours as needed for wheezing or shortness of breath.    Historical Provider, MD  Calcium Citrate-Vitamin D (CALCIUM CITRATE +D PO) Take 1 tablet by mouth daily. Calcium 600 mg    Historical Provider, MD  gabapentin (NEURONTIN) 300 MG capsule Take 300 mg by mouth 3 (three) times daily.    Historical Provider, MD  lisinopril (PRINIVIL,ZESTRIL) 20 MG tablet Take 20 mg by mouth daily.    Historical Provider, MD  methocarbamol (ROBAXIN) 500 MG tablet Take 500 mg by mouth 4 (four) times daily.    Historical Provider, MD  mometasone (ELOCON) 0.1 % cream Apply 1 application topically daily as needed (rash - summer eczema). Apply to arms    Historical Provider, MD  Multiple Vitamin (MULTIVITAMIN WITH MINERALS) TABS tablet Take 1 tablet by mouth at bedtime.    Historical Provider, MD  Multiple Vitamins-Iron (MULTI-VITAMIN/IRON PO) Take 1 tablet by mouth daily.    Historical Provider, MD  oxybutynin (DITROPAN) 5 MG tablet Take 5 mg by mouth 2 (two) times daily.    Historical Provider, MD  silver sulfADIAZINE (SILVADENE) 1 % cream Apply 1 application topically 2 (two) times daily as needed (stomach tears).    Historical Provider, MD   BP 121/71 mmHg  Pulse 72  Temp(Src)  99.5 F (37.5 C) (Oral)  Resp 18  Ht 5\' 3"  (1.6 m)  Wt 87.998 kg  BMI 34.37 kg/m2  SpO2 100% Physical Exam  Constitutional: She appears well-developed and well-nourished. No distress.  HENT:  Head: Normocephalic and atraumatic.  Eyes: Conjunctivae are normal.  Neck: Neck supple.  Cardiovascular: Normal rate, regular rhythm and intact distal pulses.   Pulmonary/Chest: Effort normal. No respiratory distress.  Abdominal: She exhibits no distension.  Musculoskeletal: Normal  range of motion.  Tenderness to the medial left knee ROM intact No discernable effusion or swelling No erythema or increased warmth No deformity, crepitus, or laxity   Neurological: She is alert. She has normal reflexes. No sensory deficit.  No sensory deficits in the distal left leg. Strength is 5 out of 5 at the left hip, knee, and ankle.  Skin: Skin is warm and dry. She is not diaphoretic. No erythema.  Psychiatric: She has a normal mood and affect. Her behavior is normal.  Nursing note and vitals reviewed.   ED Course  Procedures (including critical care time)  DIAGNOSTIC STUDIES: Oxygen Saturation is 100% on RA, normal by my interpretation.    COORDINATION OF CARE: 8:13 PM Discussed treatment plan with pt at bedside which includes Toradol and DG Knee Complete 4 Views Left and pt agreed to plan.  Imaging Review Dg Knee Complete 4 Views Left  12/08/2015  CLINICAL DATA:  55 year old female with progressive intermittent left knee pain for 1 week after exertion. Initial encounter. EXAM: LEFT KNEE - COMPLETE 4+ VIEW COMPARISON:  06/01/2011 left knee series. FINDINGS: Severe tricompartmental degenerative changes with bulky osteophytosis. Joint space loss may be maximal in the patellofemoral compartment. No definite joint effusion. Findings are chronic but progressed. Patella intact. No acute osseous abnormality identified. IMPRESSION: Chronic, progressed and severe tricompartmental degenerative changes at the  left knee. No acute osseous abnormality identified. Electronically Signed   By: Genevie Ann M.D.   On: 12/08/2015 21:00   I have personally reviewed and evaluated these images  as part of my medical decision-making.   EKG Interpretation None      MDM   Final diagnoses:  Arthritis   BARI MOZIE presents with chronic left knee pain.  Patient with progression of her chronic arthritis on x-ray. Physical exam and x-ray findings do not give suspicion for septic joint. Patient to follow-up with orthopedics as soon as possible. The patient was given instructions for home care as well as return precautions. Patient voices understanding of these instructions, accepts the plan, and is comfortable with discharge.  Filed Vitals:   12/08/15 1926 12/08/15 2126  BP: 117/64 121/71  Pulse: 65 72  Temp: 99.5 F (37.5 C)   TempSrc: Oral   Resp: 18 18  Height: 5\' 3"  (1.6 m)   Weight: 87.998 kg   SpO2: 100% 100%   I personally performed the services described in this documentation, which was scribed in my presence. The recorded information has been reviewed and is accurate.        Lorayne Bender, PA-C 12/09/15 0138   Orlie Dakin, MD 12/12/15 [phone]

## 2015-12-08 NOTE — Discharge Instructions (Signed)
You have been seen today for knee pain. Your x-ray showed progression of your arthritis. Follow-up with orthopedics for reevaluation and chronic management of this issue. Call the number provided the set up an appointment. Use ibuprofen, naproxen, or Tylenol for pain. Take these medications with food to avoid upset stomach.

## 2015-12-08 NOTE — ED Notes (Addendum)
C/o left knee pain x 2 weeks-pain started while walking-denies injury-NAD-limping gait with cane

## 2015-12-20 DIAGNOSIS — M1990 Unspecified osteoarthritis, unspecified site: Secondary | ICD-10-CM | POA: Diagnosis not present

## 2015-12-20 DIAGNOSIS — M797 Fibromyalgia: Secondary | ICD-10-CM | POA: Diagnosis not present

## 2015-12-20 DIAGNOSIS — D649 Anemia, unspecified: Secondary | ICD-10-CM | POA: Diagnosis not present

## 2015-12-20 DIAGNOSIS — E6609 Other obesity due to excess calories: Secondary | ICD-10-CM | POA: Diagnosis not present

## 2015-12-20 DIAGNOSIS — K219 Gastro-esophageal reflux disease without esophagitis: Secondary | ICD-10-CM | POA: Diagnosis not present

## 2015-12-20 DIAGNOSIS — I6789 Other cerebrovascular disease: Secondary | ICD-10-CM | POA: Diagnosis not present

## 2015-12-20 DIAGNOSIS — J4520 Mild intermittent asthma, uncomplicated: Secondary | ICD-10-CM | POA: Diagnosis not present

## 2015-12-20 DIAGNOSIS — N3281 Overactive bladder: Secondary | ICD-10-CM | POA: Diagnosis not present

## 2015-12-20 DIAGNOSIS — K912 Postsurgical malabsorption, not elsewhere classified: Secondary | ICD-10-CM | POA: Diagnosis not present

## 2015-12-20 DIAGNOSIS — Z6839 Body mass index (BMI) 39.0-39.9, adult: Secondary | ICD-10-CM | POA: Diagnosis not present

## 2015-12-27 MED FILL — raNITIdine HCL 300 MG TABS: 300 | 30 days supply | Qty: 30 | Fill #3

## 2015-12-27 MED FILL — OXYBUTYNIN 5 MG TABLET: 5 | 30 days supply | Qty: 60 | Fill #3

## 2015-12-27 MED FILL — OMEPRAZOLE DR 20 MG CAPSULE: 20 | 30 days supply | Qty: 30 | Fill #3

## 2016-01-13 DIAGNOSIS — M1711 Unilateral primary osteoarthritis, right knee: Secondary | ICD-10-CM | POA: Diagnosis not present

## 2016-01-13 DIAGNOSIS — M1712 Unilateral primary osteoarthritis, left knee: Secondary | ICD-10-CM | POA: Diagnosis not present

## 2016-01-13 DIAGNOSIS — M25561 Pain in right knee: Secondary | ICD-10-CM | POA: Diagnosis not present

## 2016-01-13 DIAGNOSIS — M170 Bilateral primary osteoarthritis of knee: Secondary | ICD-10-CM | POA: Diagnosis not present

## 2016-01-20 DIAGNOSIS — B349 Viral infection, unspecified: Secondary | ICD-10-CM | POA: Diagnosis not present

## 2016-01-24 MED FILL — AMOXICILLIN 875 MG TABLET: 875 | 5 days supply | Qty: 10 | Fill #0

## 2016-01-26 MED FILL — LISINOPRIL 20 MG TABLET: 20 | 90 days supply | Qty: 90 | Fill #1

## 2016-01-26 MED FILL — OXYBUTYNIN 5 MG TABLET: 5 | 30 days supply | Qty: 60 | Fill #4

## 2016-03-12 MED FILL — OXYBUTYNIN 5 MG TABLET: 5 | 30 days supply | Qty: 60 | Fill #5

## 2016-03-28 DIAGNOSIS — M170 Bilateral primary osteoarthritis of knee: Secondary | ICD-10-CM | POA: Diagnosis not present

## 2016-03-28 DIAGNOSIS — Z23 Encounter for immunization: Secondary | ICD-10-CM | POA: Diagnosis not present

## 2016-03-28 DIAGNOSIS — M25561 Pain in right knee: Secondary | ICD-10-CM | POA: Diagnosis not present

## 2016-03-28 DIAGNOSIS — G8929 Other chronic pain: Secondary | ICD-10-CM | POA: Diagnosis not present

## 2016-03-28 DIAGNOSIS — M25562 Pain in left knee: Secondary | ICD-10-CM | POA: Diagnosis not present

## 2016-04-05 DIAGNOSIS — M19012 Primary osteoarthritis, left shoulder: Secondary | ICD-10-CM | POA: Diagnosis not present

## 2016-04-23 MED FILL — OXYBUTYNIN 5 MG TABLET: 5 | 30 days supply | Qty: 60 | Fill #6

## 2016-04-24 MED FILL — GABAPENTIN 300 MG CAPSULE: 300 | 30 days supply | Qty: 90 | Fill #0

## 2016-04-26 DIAGNOSIS — M47816 Spondylosis without myelopathy or radiculopathy, lumbar region: Secondary | ICD-10-CM | POA: Diagnosis not present

## 2016-04-26 DIAGNOSIS — M5136 Other intervertebral disc degeneration, lumbar region: Secondary | ICD-10-CM | POA: Diagnosis not present

## 2016-04-26 DIAGNOSIS — M791 Myalgia: Secondary | ICD-10-CM | POA: Diagnosis not present

## 2016-04-26 DIAGNOSIS — M545 Low back pain: Secondary | ICD-10-CM | POA: Diagnosis not present

## 2016-04-26 MED FILL — CYCLOBENZAPRINE 5 MG TABLET: 5 | 30 days supply | Qty: 90 | Fill #0

## 2016-05-11 MED FILL — LISINOPRIL 20 MG TABLET: 20 | 30 days supply | Qty: 30 | Fill #1

## 2016-05-15 ENCOUNTER — Other Ambulatory Visit: Payer: Self-pay | Admitting: Family Medicine

## 2016-05-15 DIAGNOSIS — Z1231 Encounter for screening mammogram for malignant neoplasm of breast: Secondary | ICD-10-CM

## 2016-05-21 DIAGNOSIS — Z9221 Personal history of antineoplastic chemotherapy: Secondary | ICD-10-CM

## 2016-05-21 DIAGNOSIS — Z923 Personal history of irradiation: Secondary | ICD-10-CM

## 2016-05-21 HISTORY — DX: Personal history of irradiation: Z92.3

## 2016-05-21 HISTORY — DX: Personal history of antineoplastic chemotherapy: Z92.21

## 2016-06-11 ENCOUNTER — Ambulatory Visit
Admission: RE | Admit: 2016-06-11 | Discharge: 2016-06-11 | Disposition: A | Discharge status: Home / Self Care | Payer: Medicare Other | Source: Ambulatory Visit | Attending: Family Medicine | Admitting: Family Medicine

## 2016-06-11 DIAGNOSIS — Z1231 Encounter for screening mammogram for malignant neoplasm of breast: Secondary | ICD-10-CM

## 2016-06-12 ENCOUNTER — Other Ambulatory Visit: Payer: Self-pay | Admitting: Family Medicine

## 2016-06-12 DIAGNOSIS — N644 Mastodynia: Secondary | ICD-10-CM

## 2016-06-12 DIAGNOSIS — N63 Unspecified lump in unspecified breast: Secondary | ICD-10-CM

## 2016-06-12 MED FILL — OXYBUTYNIN 5 MG TABLET: 5 | 30 days supply | Qty: 60 | Fill #0

## 2016-06-12 MED FILL — CYCLOBENZAPRINE 5 MG TABLET: 5 | 30 days supply | Qty: 90 | Fill #1

## 2016-06-12 MED FILL — LISINOPRIL 20 MG TABLET: 20 | 90 days supply | Qty: 90 | Fill #0

## 2016-06-21 DIAGNOSIS — E7800 Pure hypercholesterolemia, unspecified: Secondary | ICD-10-CM | POA: Diagnosis not present

## 2016-06-21 DIAGNOSIS — I10 Essential (primary) hypertension: Secondary | ICD-10-CM | POA: Diagnosis not present

## 2016-06-21 DIAGNOSIS — M797 Fibromyalgia: Secondary | ICD-10-CM | POA: Diagnosis not present

## 2016-06-21 DIAGNOSIS — N3281 Overactive bladder: Secondary | ICD-10-CM | POA: Diagnosis not present

## 2016-06-22 ENCOUNTER — Other Ambulatory Visit: Payer: Self-pay | Admitting: Family Medicine

## 2016-06-22 ENCOUNTER — Ambulatory Visit
Admission: RE | Admit: 2016-06-22 | Discharge: 2016-06-22 | Disposition: A | Discharge status: Home / Self Care | Payer: Medicare Other | Source: Ambulatory Visit | Attending: Family Medicine | Admitting: Family Medicine

## 2016-06-22 DIAGNOSIS — N6321 Unspecified lump in the left breast, upper outer quadrant: Secondary | ICD-10-CM | POA: Diagnosis not present

## 2016-06-22 DIAGNOSIS — N644 Mastodynia: Secondary | ICD-10-CM

## 2016-06-22 DIAGNOSIS — N63 Unspecified lump in unspecified breast: Secondary | ICD-10-CM

## 2016-06-22 DIAGNOSIS — N6320 Unspecified lump in the left breast, unspecified quadrant: Secondary | ICD-10-CM

## 2016-06-26 ENCOUNTER — Other Ambulatory Visit: Payer: Self-pay | Admitting: Family Medicine

## 2016-06-26 ENCOUNTER — Ambulatory Visit
Admission: RE | Admit: 2016-06-26 | Discharge: 2016-06-26 | Disposition: A | Discharge status: Home / Self Care | Payer: Medicare Other | Source: Ambulatory Visit | Attending: Family Medicine | Admitting: Family Medicine

## 2016-06-26 DIAGNOSIS — N644 Mastodynia: Secondary | ICD-10-CM

## 2016-06-26 DIAGNOSIS — N6321 Unspecified lump in the left breast, upper outer quadrant: Secondary | ICD-10-CM | POA: Diagnosis not present

## 2016-06-26 DIAGNOSIS — C50812 Malignant neoplasm of overlapping sites of left female breast: Secondary | ICD-10-CM | POA: Diagnosis not present

## 2016-06-26 DIAGNOSIS — N63 Unspecified lump in unspecified breast: Secondary | ICD-10-CM

## 2016-06-26 DIAGNOSIS — N6323 Unspecified lump in the left breast, lower outer quadrant: Secondary | ICD-10-CM | POA: Diagnosis not present

## 2016-06-26 DIAGNOSIS — N6320 Unspecified lump in the left breast, unspecified quadrant: Secondary | ICD-10-CM

## 2016-07-02 ENCOUNTER — Ambulatory Visit: Payer: Self-pay | Admitting: Surgery

## 2016-07-02 DIAGNOSIS — C50412 Malignant neoplasm of upper-outer quadrant of left female breast: Secondary | ICD-10-CM

## 2016-07-02 DIAGNOSIS — C50912 Malignant neoplasm of unspecified site of left female breast: Secondary | ICD-10-CM | POA: Diagnosis not present

## 2016-07-02 DIAGNOSIS — Z170 Estrogen receptor positive status [ER+]: Principal | ICD-10-CM

## 2016-07-02 NOTE — H&P (Signed)
[name] 07/02/2016 9:23 AM Location: [facility] Patient #: [id number] DOB: [birth year] Single / Language: [name] / Race: Black or African American Female  History of Present Illness Alicia Moores A. Scorpio Fortin MD; 07/02/2016 10:38 AM) Patient words: Patient sent at the request of Dr. Radford Pax from the [facility] due to poor mammographic abnormality in the left breast. Patient's been having pain in her left breast since December. Mammogram and ultrasound were obtained which showed a 1.8 cm mass left breast upper quadrant with a negative axilla. 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 Her sister had breast cancer in 2016 and I follow her as well. She states the areas been sore often on prior to the biopsy. She states sometimes her breast feels like it's going to explode. She has a history of chronic left breast pain 20 years ago with nipple discharge in the past. She's had no recent nipple discharge recently.                               ADDITIONAL INFORMATION: PROGNOSTIC INDICATORS Results: IMMUNOHISTOCHEMICAL AND MORPHOMETRIC ANALYSIS PERFORMED MANUALLY Estrogen Receptor: 80%, POSITIVE, STRONG STAINING INTENSITY Progesterone Receptor: 20%, POSITIVE, STRONG STAINING INTENSITY Proliferation Marker Ki67: 60% REFERENCE RANGE ESTROGEN RECEPTOR NEGATIVE 0% POSITIVE =>1% REFERENCE RANGE PROGESTERONE RECEPTOR NEGATIVE 0% POSITIVE =>1% All controls stained appropriately Enid Cutter MD Pathologist, Electronic Signature ( Signed 06/28/2016) FINAL DIAGNOSIS Diagnosis Breast, left, needle core biopsy, 3:00 o'clock 6 cmfn - INVASIVE DUCTAL CARCINOMA. - DUCTAL CARCINOMA IN SITU. - SEE COMMENT. 1 of 2 FINAL for [place], [name] [phone]) Microscopic Comment Although definitive grading of breast carcinoma is best done on excision, the features of the invasive  tumor from the left 3 o'clock breast biopsy 6 cm from the nipple are compatible with a grade 3 breast carcinoma. Breast prognostic markers will be performed and reported in an addendum. Findings are called to the [place] on 06/27/2016. Dr. Lyndon Code has seen this case in consultation with agreement. (RH:ecj 06/27/2016) Willeen Niece MD Pathologist, Electronic Signature (Case signed 06/27/2016) Specimen Gross and Clinical Information Specimen Comment In formalin 4:30 pm extracted less than 1 min; suspicious mass 3:00 left breast 1.8 cm Specimen(s) Obtained: Breast, left, needle core biopsy, 3:00 o'clock 6 cmfn Specimen Clinical Information Invasive mammary carcinoma Gross Received labeled "[name],[name]" and "Left breast 3:00" (TIF 1630 CIT <78mn) are 3 cores of yellow red to gray white soft to firm tissue, ranging from 0.6 x 0.15 x 0.1 cm to 1.5 x 0.15 x 0.1 cm. One block submitted. (SSW 2/6) Stain(s) used in Diagnosis: The following stain(s) were used in diagnosing           CLINICAL DATA: 56year old presenting with a palpable lump in the outer left breast at posterior depth. Annual evaluation, right breast. Patient also complains of diffuse left breast pain.  EXAM: 2D DIGITAL DIAGNOSTIC BILATERAL MAMMOGRAM WITH CAD AND ADJUNCT TOMO  ULTRASOUND LEFT BREAST  COMPARISON: Mammography 03/21/2015, 12/16/2013 and earlier. No prior ultrasound.  ACR Breast Density Category [name]: The breast tissue is extremely dense, which lowers the sensitivity of mammography.  FINDINGS: Standard 2D and tomosynthesis full field CC and MLO views of both breasts were obtained. A standard and tomosynthesis spot tangential view of the area of concern in the left breast was also obtained.  Corresponding to the area of palpable concern is a new focal  asymmetry or mass in the outer left breast at posterior depth. No suspicious findings elsewhere in the left breast.  No findings  suspicious for malignancy in the right breast.  Mammographic images were processed with CAD.  On physical exam, there is a palpable approximate 1-2 cm lump in the outer left breast corresponding to what the patient is feeling.  Targeted left breast ultrasound is performed, showing a hypoechoic mass with irregular margins demonstrating internal power Doppler flow and slight acoustic enhancement at the 3 o'clock position approximately 6 cm from the nipple measuring approximately 0.9 x 1.2 x 1.8 cm, corresponding to the palpable concern and the mammographic abnormality.  Sonographic evaluation of the left axilla demonstrates no pathologic lymphadenopathy.  IMPRESSION: 1. Suspicious approximate 1.8 cm mass involving the outer left breast at the 3 o'clock position approximately 6 cm from the nipple corresponding to the palpable concern. 2. No pathologic left axillary lymphadenopathy. 3. No mammographic evidence of malignancy, right breast.  RECOMMENDATION: Ultrasound-guided core needle biopsy of the suspicious left breast mass.  The ultrasound biopsy procedure was discussed with the patient and her questions were answered. She has agreed to proceed and this has been scheduled for Tuesday, February 6.  I have discussed the findings and recommendations with the patient. Results were also provided in writing at the conclusion of the visit.  BI-RADS CATEGORY 5: Highly suggestive of malignancy.   Electronically Signed By: Evangeline Dakin M.[name]. On: 06/22/2016 14:44           ADDITIONAL INFORMATION: PROGNOSTIC INDICATORS Results: IMMUNOHISTOCHEMICAL AND MORPHOMETRIC ANALYSIS PERFORMED MANUALLY Estrogen Receptor: 80%, POSITIVE, STRONG STAINING INTENSITY Progesterone Receptor: 20%, POSITIVE, STRONG STAINING INTENSITY Proliferation Marker Ki67: 60% REFERENCE RANGE ESTROGEN RECEPTOR NEGATIVE 0% POSITIVE =>1% REFERENCE RANGE PROGESTERONE RECEPTOR NEGATIVE 0% POSITIVE  =>1% All controls stained appropriately Enid Cutter MD Pathologist, Electronic Signature ( Signed 06/28/2016) FINAL DIAGNOSIS Diagnosis Breast, left, needle core biopsy, 3:00 o'clock 6 cmfn - INVASIVE DUCTAL CARCINOMA. - DUCTAL CARCINOMA IN SITU. - SEE COMMENT. 1 of 2 FINAL for [place], [name] [phone]) Microscopic Comment Although definitive grading of breast carcinoma is best done on excision, the features of the invasive tumor from the left 3 o'clock breast biopsy 6 cm from the nipple are compatible with a grade 3 breast carcinoma. Breast prognostic markers will be performed and reported in an addendum. Findings are called to the [place] on 06/27/2016. Dr. Lyndon Code has seen this case in consultation with agreement. (RH:ecj 06/27/2016) Willeen Niece MD Pathologist, Electronic Signature (Case signed 06/27/2016) Specimen Gross and Clinical Information Specimen Comment In formalin 4:30 pm extracted less than 1 min; suspicious mass 3:00 left breast 1.8 cm Specimen(s) Obtained: Breast, left, needle core biopsy, 3:00 o'clock 6 cmfn Specimen Clinical Information Invasive mammary carcinoma Gross Received labeled "[name],[name]" and "Left breast 3:00" (TIF 1630 CIT <62mn) are 3 cores of yellow red to gray white soft to firm tissue, ranging from 0.6 x 0.15 x 0.1 cm to 1.5 x 0.15 x 0.1 cm. One block submitted. (SSW 2/6) Stain(s) used in Diagnosis: The following stain(s) were used in diagnosing the case: Her2 FISH, ER-ACIS, PR-ACIS, KI-67-ACIS. The control(s) stained appropriately. Disclaimer PR progesterone receptor (16), immunohistochemical stains are performed on formalin fixed, paraffin embedded tissue using a 3,3"-diaminobenzidine (DAB) chromogen and Leica Bond Autostainer System. The staining intensity of the nucleus is scored manually and is reported as the percentage of tumor cell nuclei demonstrating specific nuclear staining. Estrogen receptor (6F11),  immunohistochemical stains are performed on formalin fixed, paraffin  embedded tissue using a 3,3"-diaminobenzidine (DAB) chromogen and Leica Bond Autostainer System. The staining intensity of the nucleus is scored manually and is reported as the percentage of tumor cell nuclei demonstrating specific nuclear staining. HER2 IQFISH pharmDX (code [id number]) is a direct fluorescence in-situ hybridization assay designed to quantitatively determine HER2 gene amplification in formalin-fixed, paraffin-embedded tissue specimens. It is performed at [facility] and is reported using ASCO/CAP scoring criteria published in 2013. Ki-67 (MM1), immunohistochemical stains are performed on formalin fixed, paraffin embedded tissue using a 3,3"-diaminobenzidine (DAB) chromogen and Leica Bond Autostainer System. The staining intensity of the nucleus is scored manually and is reported as the percentage of tumor cell nuclei demonstrating specific nuclear staining.  The patient is a 56 year old female.   Allergies (Armen Eulas Post, CMA; 07/02/2016 9:24 AM) OxyCODONE HCl *ANALGESICS - OPIOID* Propoxyphene Compound *ANALGESICS - OPIOID* Adhesive Tape Aspir-81 *ANALGESICS - NonNarcotic* PredniSONE *CORTICOSTEROIDS*  Medication History (Armen Glenn, CMA; 07/02/2016 9:25 AM) Lisinopril (20MG Tablet, Oral) Active. Oxybutynin Chloride (5MG Tablet, Oral) Active. Multivitamins/Minerals (Oral) Active.    Vitals (Armen Glenn CMA; 07/02/2016 9:24 AM) 07/02/2016 9:23 AM Weight: 199.13 lb Height: 61in Body Surface Area: 1.89 m Body Mass Index: 37.62 kg/m  Temp.: 33F  Pulse: 108 (Regular)  P.OX: 96% (Room air) BP: 138/74 (Sitting, Left Arm, Standard)      Physical Exam (Nycere Presley A. Any Mcneice MD; 07/02/2016 10:27 AM)  General Mental Status-Alert. General Appearance-Consistent with stated age. Hydration-Well hydrated. Voice-Normal.  Head and Neck Head-normocephalic, atraumatic with no  lesions or palpable masses. Trachea-midline. Thyroid Gland Characteristics - normal size and consistency.  Chest and Lung Exam Chest and lung exam reveals -quiet, even and easy respiratory effort with no use of accessory muscles and on auscultation, normal breath sounds, no adventitious sounds and normal vocal resonance. Inspection Chest Wall - Normal. Back - normal.  Breast Note: Left breast tender upper quadrant were Steri-Stripped from biopsy noted. Roughly a 1 cm mobile mass. Right breast is normal.  Cardiovascular Cardiovascular examination reveals -normal heart sounds, regular rate and rhythm with no murmurs and normal pedal pulses bilaterally.  Neurologic Neurologic evaluation reveals -alert and oriented x 3 with no impairment of recent or remote memory. Mental Status-Normal.  Musculoskeletal Note: Walks with a cane.  Lymphatic Head & Neck  General Head & Neck Lymphatics: Bilateral - Description - Normal. Axillary  General Axillary Region: Bilateral - Description - Normal. Tenderness - Non Tender.    Assessment & Plan (Byran Bilotti A. Bobbye Reinitz MD; 07/02/2016 10:41 AM)  BREAST CANCER, LEFT (C50.912) Impression: Refer to medical radiation oncology. Discussed breast conservation versus mastectomy with the patient. Risks, benefits and alternatives to each surgery and/or treatments were discussed at great length. Long-term expectations and potential other treatments and/or surgeries discussed. The patient opted for left breast needle localized partial mastectomy and left axillary sentinel lymph node mapping. Risk of lumpectomy include bleeding, infection, seroma, more surgery, use of seed/wire, wound care, cosmetic deformity and the need for other treatments, death , blood clots, death. Pt agrees to proceed. Risk of sentinel lymph node mapping include bleeding, infection, lymphedema, shoulder pain. stiffness, dye allergy. cosmetic deformity , blood clots, death, need for  more surgery. Pt agres to proceed.  Current Plans You are being scheduled for surgery- Our schedulers will call you.  You should hear from our office's scheduling department within 5 working days about the location, date, and time of surgery. We try to make accommodations for patient's preferences in scheduling surgery, but sometimes the OR schedule  or the surgeon's schedule prevents [name] from making those accommodations.  If you have not heard from our office [phone]) in 5 working days, call the office and ask for your surgeon's nurse.  If you have other questions about your diagnosis, plan, or surgery, call the office and ask for your surgeon's nurse.  Pt Education - [facility] Breast Cancer Information Given - [facility] "Breast Journey" Package We discussed the staging and pathophysiology of breast cancer. We discussed all of the different options for treatment for breast cancer including surgery, chemotherapy, radiation therapy, Herceptin, and antiestrogen therapy. We discussed a sentinel lymph node biopsy as she does not appear to having lymph node involvement right now. We discussed the performance of that with injection of radioactive tracer and blue dye. We discussed that she would have an incision underneath her axillary hairline. We discussed that there is a bout a 10-20% chance of having a positive node with a sentinel lymph node biopsy and we will await the permanent pathology to make any other first further decisions in terms of her treatment. One of these options might be to return to the operating room to perform an axillary lymph node dissection. We discussed about a 1-2% risk lifetime of chronic shoulder pain as well as lymphedema associated with a sentinel lymph node biopsy. We discussed the options for treatment of the breast cancer which included lumpectomy versus a mastectomy. We discussed the performance of the lumpectomy with a wire placement. We discussed a 10-20% chance of a  positive margin requiring reexcision in the operating room. We also discussed that she may need radiation therapy or antiestrogen therapy or both if she undergoes lumpectomy. We discussed the mastectomy and the postoperative care for that as well. We discussed that there is no difference in her survival whether she undergoes lumpectomy with radiation therapy or antiestrogen therapy versus a mastectomy. There is a slight difference in the local recurrence rate being 3-5% with lumpectomy and about 1% with a mastectomy. We discussed the risks of operation including bleeding, infection, possible reoperation. She understands her further therapy will be based on what her stages at the time of her operation.  Pt Education - Pamphlet Given - Breast Biopsy: discussed with patient and provided information. Pt Education - ABC (After Breast Cancer) Class Info: discussed with patient and provided information.

## 2016-07-04 ENCOUNTER — Telehealth: Payer: Self-pay | Admitting: Hematology and Oncology

## 2016-07-04 ENCOUNTER — Encounter: Payer: Self-pay | Admitting: Hematology and Oncology

## 2016-07-04 NOTE — Telephone Encounter (Signed)
Appt has been scheduled for the pt to see Dr. Lindi Adie on 2/20 at 345pm. Pt agreed to the appt date and time. Demographics verified. Letter mailed.

## 2016-07-06 ENCOUNTER — Encounter: Payer: Self-pay | Admitting: Radiation Oncology

## 2016-07-06 NOTE — Progress Notes (Signed)
Location of Breast Cancer:Left Breast  3:00 o'clock position   Histology per Pathology Report: Diagnosis 06/26/16:Dr. Erroll Luna, MD Breast, left, needle core biopsy, 3:00 o'clock 6 cmfn - INVASIVE DUCTAL CARCINOMA. - DUCTAL CARCINOMA IN SITU. - SEE COMMENT.  Receptor Status: ER(80%+, PR (20%+), Her2-neu (neg ratio =1.18), Ki-67(60%)  Did patient present with symptoms (if so, please note symptoms) or was this found on screening mammography?:  Patient having pains in breast,left since December, hx nipple discharge in the past,  Past/Anticipated interventions by surgeon, if any: Dr. Erroll Luna, MD seen 07/02/16  Past/Anticipated interventions by medical oncology, if any: Chemotherapy Dr. Lindi Adie seen 07/10/16  Lymphedema issues, if any:  No  Pain issues, if any: occasional discomfort, sharp pain in chest wall, left and goes to he nipple stated  SAFETY ISSUES: ambulates with a cane,  Prior radiation? No  Pacemaker/ICD? No  Possible current pregnancy? No  Is the patient on methotrexate? NO  Current Complaints / other details:  Single, G0Po, menses age 56,  Sister breast cancer 2016,Dx 2016, right, lumpectomy, radiation 2017,   Dr. Pablo Ledger,  Father prostate cancer, Paternal aunt breast cancer >40 years ago, living, paternal grandm,other cervical cancer, maternal grandmother ovarian cancer, paternal uncle jaw cancer,  BP 121/67 (BP Location: Right Arm, Patient Position: Sitting, Cuff Size: Large)   Pulse 62   Temp 98.7 F (37.1 C) (Oral)   Resp 18   Ht 5' 1"  (1.549 m)   Wt 200 lb 9.6 oz (91 kg)   BMI 37.90 kg/m   Wt Readings from Last 3 Encounters:  07/12/16 200 lb 9.6 oz (91 kg)  07/10/16 197 lb 14.4 oz (89.8 kg)  12/08/15 194 lb (88 kg)   Rebecca Eaton, RN 07/06/2016,1:43 PM

## 2016-07-10 ENCOUNTER — Ambulatory Visit (HOSPITAL_BASED_OUTPATIENT_CLINIC_OR_DEPARTMENT_OTHER): Payer: Medicare Other | Admitting: Hematology and Oncology

## 2016-07-10 ENCOUNTER — Encounter: Payer: Self-pay | Admitting: Hematology and Oncology

## 2016-07-10 DIAGNOSIS — Z170 Estrogen receptor positive status [ER+]: Secondary | ICD-10-CM | POA: Insufficient documentation

## 2016-07-10 DIAGNOSIS — C50512 Malignant neoplasm of lower-outer quadrant of left female breast: Secondary | ICD-10-CM | POA: Insufficient documentation

## 2016-07-10 DIAGNOSIS — C50812 Malignant neoplasm of overlapping sites of left female breast: Secondary | ICD-10-CM | POA: Diagnosis not present

## 2016-07-10 NOTE — Assessment & Plan Note (Signed)
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 After reviewing the pathology in detail, we proceeded to discuss the different treatment options between surgery, radiation, chemotherapy, antiestrogen therapies.  Recommendations: 1. Breast conserving surgery followed by 2. Oncotype DX testing to determine if chemotherapy would be of any benefit followed by 3. Adjuvant radiation therapy followed by 4. Adjuvant antiestrogen therapy  Oncotype counseling: I discussed Oncotype DX test. I explained to the patient that this is a 21 gene panel to evaluate patient tumors DNA to calculate recurrence score. This would help determine whether patient has high risk or intermediate risk or low risk breast cancer. She understands that if her tumor was found to be high risk, she would benefit from systemic chemotherapy. If low risk, no need of chemotherapy. If she was found to be intermediate risk, we would need to evaluate the score as well as other risk factors and determine if an abbreviated chemotherapy may be of benefit.  Return to clinic after surgery to discuss final pathology report and then determine if Oncotype DX testing will need to be sent.

## 2016-07-10 NOTE — Progress Notes (Signed)
[facility] CONSULT NOTE  Patient Care Team: [name], MD as PCP - General (Family Medicine)  CHIEF COMPLAINTS/PURPOSE OF CONSULTATION:  Newly diagnosed breast cancer  HISTORY OF PRESENTING ILLNESS:  [name] 56 y.o. female is here because of recent diagnosis of left breast cancer. Patient felt a lump in the left breast accompanied by some discomfort. This led to mammogram that revealed a 1.8 cm lesion left breast. She subsequently underwent ultrasound. Ultrasound-guided biopsy was performed. 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 She was seen by Dr. Brantley Stage who recommended lumpectomy. She was sent to me for discussion regarding adjuvant treatment options. She is here today accompanied by her sister who is a breast cancer survivor and was receiving treatment with Dr. Burr Medico.  I reviewed her records extensively and collaborated the history with the patient.  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 Arthritis    osteoarthritis  . Asthma    states no asthma attack since 2002  . Chronic back pain   . Complication of anesthesia    states takes more than normal to put her to sleep  . Dental bridge present    upper  . Dental crowns present   . DVT of upper extremity (deep vein thrombosis) ([place])   . Fibromyalgia   . Headache(784.0)    migraines  . History of blood transfusion 06/2005  . History of gallstones   . History of pneumonia   . History of shingles 07/2011  . HTN (hypertension)   . PFO  (patent foramen ovale)   . Presence of inferior vena cava filter   . Pulmonary embolism ([place])   . Sleep apnea    used CPAP until after bypass surg.  . Status post gastric bypass for obesity   . Stroke [facility]) 12/2002   right-sided weakness  . Urinary frequency   . Urinary incontinence     SURGICAL HISTORY: Past Surgical History:  Procedure Laterality Date  . ABDOMINAL HYSTERECTOMY  11/1997   complete  . ANTERIOR CERVICAL DECOMP/DISCECTOMY FUSION  02/05/2005   C5-6  . APPENDECTOMY  10/22/2008   laparoscopic  . BUNIONECTOMY  05/1980   both feet  . BUNIONECTOMY  08/2011   left foot  . CARDIAC CATHETERIZATION  03/04/2002  . CARPAL TUNNEL RELEASE  06/21/2009   right  . CARPAL TUNNEL RELEASE     left hand  . CARPAL TUNNEL RELEASE  10/03/2011   Procedure: CARPAL TUNNEL RELEASE;  Surgeon: Wynonia Sours, MD;  Location: [facility];  Service: Orthopedics;  Laterality: Right;  CARPAL TUNNEL WITH HYPOTHENAR FAT PAD TRANSFER  . CERVICAL SPINE SURGERY  01/2005   titanium plate implanted  . CHOLECYSTECTOMY  1990  . ELBOW SURGERY  08/09/2004   decompression ulnar nerve right elbow  . ENTEROLYSIS  10/22/2008   laparoscopic abd. enterolysis  . GASTRIC ROUX-EN-Y  2009  . HEEL SPUR SURGERY  08/1997   left  . HEMORRHOID SURGERY  03/1993  .  LAPAROSCOPIC LYSIS INTESTINAL ADHESIONS  02/14/2000  . NAILBED REPAIR  01/10/2005; 08/2011   exc. matrix bilat. great toe  . OTHER SURGICAL HISTORY  12/1986   pt states that she had surgery to unclog her fallopean tubes  . SHOULDER SURGERY     bilat. - (left:  06/2005)  . TONSILLECTOMY  07/1995  . TRIGGER FINGER RELEASE  04/25/2006   decompression A-1 pulley left thumb  . UTERINE FIBROID SURGERY  12/95, 7/96   x2  . VENA CAVA FILTER PLACEMENT  2009   during Roux-en-Y surg.    SOCIAL HISTORY: Social History   Social History  . Marital status: Single    Spouse name: N/A  . Number of children: 0  . Years of education: N/A   Occupational History  .  disabled    Social History Main Topics  . Smoking status: Former Research scientist (life sciences)  . Smokeless tobacco: Never Used     Comment: quit smoking 08/1989  . Alcohol use No  . Drug use: No  . Sexual activity: Not on file   Other Topics Concern  . Not on file   Social History Narrative  . No narrative on file    FAMILY HISTORY: Family History  Problem Relation Age of Onset  . Breast cancer Paternal Aunt   . Breast cancer Paternal Grandmother   . Breast cancer Maternal Grandmother   . Colon polyps Father   . Diabetes Father     borderline  . Hypertension Mother   . Hypertension      ALLERGIES:  is allergic to oxycodone hcl; propoxyphene n-acetaminophen; tramadol; adhesive [tape]; aspirin; and prednisone.  MEDICATIONS:  Current Outpatient Prescriptions  Medication Sig Dispense Refill  . acetaminophen (TYLENOL) 500 MG tablet Take 1,000 mg by mouth every 6 (six) hours as needed (pain).    Marland Kitchen albuterol (PROAIR HFA) 108 (90 Base) MCG/ACT inhaler Inhale 2 puffs into the lungs every 6 (six) hours as needed for wheezing or shortness of breath.    . Calcium Citrate-Vitamin D (CALCIUM CITRATE +D PO) Take 1 tablet by mouth daily. Calcium 600 mg    . gabapentin (NEURONTIN) 300 MG capsule Take 300 mg by mouth 3 (three) times daily.    Marland Kitchen lisinopril (PRINIVIL,ZESTRIL) 20 MG tablet Take 20 mg by mouth daily.    . methocarbamol (ROBAXIN) 500 MG tablet Take 500 mg by mouth 4 (four) times daily.    . mometasone (ELOCON) 0.1 % cream Apply 1 application topically daily as needed (rash - summer eczema). Apply to arms    . Multiple Vitamin (MULTIVITAMIN WITH MINERALS) TABS tablet Take 1 tablet by mouth at bedtime.    . Multiple Vitamins-Iron (MULTI-VITAMIN/IRON PO) Take 1 tablet by mouth daily.    Marland Kitchen oxybutynin (DITROPAN) 5 MG tablet Take 5 mg by mouth 2 (two) times daily.    . silver sulfADIAZINE (SILVADENE) 1 % cream Apply 1 application topically 2 (two) times daily as needed (stomach tears).     No current  facility-administered medications for this visit.     REVIEW OF SYSTEMS:   Constitutional: Denies fevers, chills or abnormal night sweats Eyes: Denies blurriness of vision, double vision or watery eyes Ears, nose, mouth, throat, and face: Denies mucositis or sore throat Respiratory: Denies cough, dyspnea or wheezes Cardiovascular: Denies palpitation, chest discomfort or lower extremity swelling Gastrointestinal:  Denies nausea, heartburn or change in bowel habits Skin: Denies abnormal skin rashes Lymphatics: Denies new lymphadenopathy or easy bruising Neurological:Denies numbness, tingling or new weaknesses Behavioral/Psych:  Mood is stable, no new changes  Breast: Palpable lump in left breast with pain and discomfort All other systems were reviewed with the patient and are negative.  PHYSICAL EXAMINATION: ECOG PERFORMANCE STATUS: 1 - Symptomatic but completely ambulatory  Vitals:   07/10/16 1540  BP: (!) 117/52  Pulse: 98  Resp: 18  Temp: 98.2 F (36.8 C)   Filed Weights   07/10/16 1540  Weight: 197 lb 14.4 oz (89.8 kg)    GENERAL:alert, no distress and comfortable SKIN: skin color, texture, turgor are normal, no rashes or significant lesions EYES: normal, conjunctiva are pink and non-injected, sclera clear OROPHARYNX:no exudate, no erythema and lips, buccal mucosa, and tongue normal  NECK: supple, thyroid normal size, non-tender, without nodularity LYMPH:  no palpable lymphadenopathy in the cervical, axillary or inguinal LUNGS: clear to auscultation and percussion with normal breathing effort HEART: regular rate & rhythm and no murmurs and no lower extremity edema ABDOMEN:abdomen soft, non-tender and normal bowel sounds Musculoskeletal:no cyanosis of digits and no clubbing  PSYCH: alert & oriented x 3 with fluent speech NEURO: no focal motor/sensory deficits BREAST:Palpable lump left breast. No palpable axillary or supraclavicular lymphadenopathy (exam performed in the  presence of a chaperone)   LABORATORY DATA:  I have reviewed the data as listed Lab Results  Component Value Date   WBC 4.8 10/01/2015   HGB 11.6 (L) 10/01/2015   HCT 35.2 (L) 10/01/2015   MCV 93.6 10/01/2015   PLT 161 10/01/2015   Lab Results  Component Value Date   NA 142 10/01/2015   K 3.8 10/01/2015   CL 104 10/01/2015   CO2 29 10/01/2015    RADIOGRAPHIC STUDIES: I have personally reviewed the radiological reports and agreed with the findings in the report.  ASSESSMENT AND PLAN:  Malignant neoplasm of lower-outer quadrant of left breast of female, estrogen receptor positive ([place]) 06/26/2016: Left breast biopsy 3:00: IDC with DCIS, grade 3, ER 80%, PR 20%, Ki-67 60%, HER-2 negative ratio 1.18; palpable lump: 1.8 cm lesion, no lymph nodes, T1 cN0 stage I a clinical stage  Pathology and radiology counseling:Discussed with the patient, the details of pathology including the type of breast cancer,the clinical staging, the significance of ER, PR and HER-2/neu receptors and the implications for treatment. After reviewing the pathology in detail, we proceeded to discuss the different treatment options between surgery, radiation, chemotherapy, antiestrogen therapies.  Recommendations: 1. Breast conserving surgery followed by 2. Oncotype DX testing to determine if chemotherapy would be of any benefit followed by 3. Adjuvant radiation therapy followed by 4. Adjuvant antiestrogen therapy  Oncotype counseling: I discussed Oncotype DX test. I explained to the patient that this is a 21 gene panel to evaluate patient tumors DNA to calculate recurrence score. This would help determine whether patient has high risk or intermediate risk or low risk breast cancer. She understands that if her tumor was found to be high risk, she would benefit from systemic chemotherapy. If low risk, no need of chemotherapy. If she was found to be intermediate risk, we would need to evaluate the score as well as  other risk factors and determine if an abbreviated chemotherapy may be of benefit.  Patient is not a great candidate for chemotherapy because of prior history of stroke. However if she were to be high risk we might consider doing chemotherapy on her. Patient also does not want to undergo chemotherapy unless it is absolutely essential.  Return to clinic after surgery to discuss final pathology report and  then determine if Oncotype DX testing will need to be sent.  All questions were answered. The patient knows to call the clinic with any problems, questions or concerns.    Rulon Eisenmenger, MD 07/10/16

## 2016-07-11 ENCOUNTER — Encounter: Payer: Self-pay | Admitting: *Deleted

## 2016-07-12 ENCOUNTER — Ambulatory Visit
Admission: RE | Admit: 2016-07-12 | Discharge: 2016-07-12 | Disposition: A | Discharge status: Home / Self Care | Payer: Medicare Other | Source: Ambulatory Visit | Attending: Radiation Oncology | Admitting: Radiation Oncology

## 2016-07-12 ENCOUNTER — Other Ambulatory Visit: Payer: Self-pay | Admitting: Surgery

## 2016-07-12 ENCOUNTER — Encounter: Payer: Self-pay | Admitting: Radiation Oncology

## 2016-07-12 DIAGNOSIS — Z86711 Personal history of pulmonary embolism: Secondary | ICD-10-CM | POA: Diagnosis not present

## 2016-07-12 DIAGNOSIS — C50512 Malignant neoplasm of lower-outer quadrant of left female breast: Secondary | ICD-10-CM | POA: Diagnosis not present

## 2016-07-12 DIAGNOSIS — Z803 Family history of malignant neoplasm of breast: Secondary | ICD-10-CM | POA: Diagnosis not present

## 2016-07-12 DIAGNOSIS — M1990 Unspecified osteoarthritis, unspecified site: Secondary | ICD-10-CM | POA: Diagnosis not present

## 2016-07-12 DIAGNOSIS — G4730 Sleep apnea, unspecified: Secondary | ICD-10-CM | POA: Insufficient documentation

## 2016-07-12 DIAGNOSIS — Z8673 Personal history of transient ischemic attack (TIA), and cerebral infarction without residual deficits: Secondary | ICD-10-CM | POA: Diagnosis not present

## 2016-07-12 DIAGNOSIS — Z833 Family history of diabetes mellitus: Secondary | ICD-10-CM | POA: Insufficient documentation

## 2016-07-12 DIAGNOSIS — Z8042 Family history of malignant neoplasm of prostate: Secondary | ICD-10-CM | POA: Insufficient documentation

## 2016-07-12 DIAGNOSIS — Z170 Estrogen receptor positive status [ER+]: Secondary | ICD-10-CM | POA: Diagnosis not present

## 2016-07-12 DIAGNOSIS — M797 Fibromyalgia: Secondary | ICD-10-CM | POA: Insufficient documentation

## 2016-07-12 DIAGNOSIS — Z79899 Other long term (current) drug therapy: Secondary | ICD-10-CM | POA: Insufficient documentation

## 2016-07-12 DIAGNOSIS — Z888 Allergy status to other drugs, medicaments and biological substances status: Secondary | ICD-10-CM | POA: Insufficient documentation

## 2016-07-12 DIAGNOSIS — C50412 Malignant neoplasm of upper-outer quadrant of left female breast: Secondary | ICD-10-CM

## 2016-07-12 DIAGNOSIS — Z8249 Family history of ischemic heart disease and other diseases of the circulatory system: Secondary | ICD-10-CM | POA: Insufficient documentation

## 2016-07-12 DIAGNOSIS — F17211 Nicotine dependence, cigarettes, in remission: Secondary | ICD-10-CM | POA: Diagnosis not present

## 2016-07-12 DIAGNOSIS — Z9884 Bariatric surgery status: Secondary | ICD-10-CM | POA: Insufficient documentation

## 2016-07-12 DIAGNOSIS — Z885 Allergy status to narcotic agent status: Secondary | ICD-10-CM | POA: Diagnosis not present

## 2016-07-12 HISTORY — DX: Malignant neoplasm of unspecified site of unspecified female breast: C50.919

## 2016-07-12 NOTE — Progress Notes (Signed)
Please see the Nurse Progress Note in the MD Initial Consult Encounter for this patient. 

## 2016-07-12 NOTE — Progress Notes (Signed)
Radiation Oncology         [phone] ________________________________  Name: [name] MRN: [id number]  Date: 07/12/2016  DOB: 1961-02-08  WN:[id number], [name], MD  Erroll Luna, MD     REFERRING PHYSICIAN: Erroll Luna, MD   DIAGNOSIS: The encounter diagnosis was Malignant neoplasm of lower-outer quadrant of left breast of female, estrogen receptor positive ([place]). Invasive ductal carcinoma with DCIS of the left breast, ER/PR+, Her2-, grade 3  HISTORY OF PRESENT ILLNESS: [name] is a 56 y.o. female seen at the request of Dr. Brantley Stage. The patient palpated a lump in her left breast accompanied by discomfort in December 2017. She proceeded to have a mammogram on 06/22/16. This revealed a 1.8 cm lesion of the left breast at the 3 o'clock position, 6 cm from the nipple. Ultrasound-guided biopsy on 06/26/16 showed invasive ductal carcinoma with DCIS, grade 3. Receptor status was ER (80%) PR (20%) Her2 (-) and Ki-67(60%). She was seen by Dr. Brantley Stage on 07/02/16 who recommended lumpectomy which is scheduled for 07/26/16. She was seen by Dr. Lindi Adie on 07/10/16 who recommended oncotype testing to determine the benefit of chemotherapy. She comes today to discuss the role of adjuvant radiotherapy.   PREVIOUS RADIATION THERAPY: No   PAST MEDICAL HISTORY:  Past Medical History:  Diagnosis Date  . Arthritis    osteoarthritis  . Asthma    states no asthma attack since 2002  . Breast cancer ([place]) 06/26/16 bx   left breast  . Chronic back pain   . Complication of anesthesia    states takes more than normal to put her to sleep  . Dental bridge present    upper  . Dental crowns present   . DVT of upper extremity (deep vein thrombosis) ([place])   . Fibromyalgia   . Headache(784.0)    migraines  . History of blood transfusion 06/2005  . History of gallstones   . History of pneumonia   . History of shingles 07/2011  . HTN (hypertension)   . PFO (patent foramen ovale)   .  Presence of inferior vena cava filter   . Pulmonary embolism ([place])   . Sleep apnea    used CPAP until after bypass surg.  . Status post gastric bypass for obesity   . Stroke [facility]) 12/2002   right-sided weakness  . Urinary frequency   . Urinary incontinence        PAST SURGICAL HISTORY: Past Surgical History:  Procedure Laterality Date  . ABDOMINAL HYSTERECTOMY  11/1997   complete  . ANTERIOR CERVICAL DECOMP/DISCECTOMY FUSION  02/05/2005   C5-6  . APPENDECTOMY  10/22/2008   laparoscopic  . BUNIONECTOMY  05/1980   both feet  . BUNIONECTOMY  08/2011   left foot  . CARDIAC CATHETERIZATION  03/04/2002  . CARPAL TUNNEL RELEASE  06/21/2009   right  . CARPAL TUNNEL RELEASE     left hand  . CARPAL TUNNEL RELEASE  10/03/2011   Procedure: CARPAL TUNNEL RELEASE;  Surgeon: Wynonia Sours, MD;  Location: [place];  Service: Orthopedics;  Laterality: Right;  CARPAL TUNNEL WITH HYPOTHENAR FAT PAD TRANSFER  . CERVICAL SPINE SURGERY  01/2005   titanium plate implanted  . CHOLECYSTECTOMY  1990  . ELBOW SURGERY  08/09/2004   decompression ulnar nerve right elbow  . ENTEROLYSIS  10/22/2008   laparoscopic abd. enterolysis  . GASTRIC ROUX-EN-Y  2009  . HEEL SPUR SURGERY  08/1997   left  . HEMORRHOID SURGERY  03/1993  .  LAPAROSCOPIC LYSIS INTESTINAL ADHESIONS  02/14/2000  . NAILBED REPAIR  01/10/2005; 08/2011   exc. matrix bilat. great toe  . OTHER SURGICAL HISTORY  12/1986   pt states that she had surgery to unclog her fallopean tubes  . SHOULDER SURGERY     bilat. - (left:  06/2005)  . TONSILLECTOMY  07/1995  . TRIGGER FINGER RELEASE  04/25/2006   decompression A-1 pulley left thumb  . UTERINE FIBROID SURGERY  12/95, 7/96   x2  . VENA CAVA FILTER PLACEMENT  2009   during Roux-en-Y surg.     FAMILY HISTORY:  Family History  Problem Relation Age of Onset  . Breast cancer Paternal Aunt   . Breast cancer Paternal Grandmother   . Breast cancer Maternal Grandmother   . Colon polyps  Father   . Diabetes Father     borderline  . Hypertension Mother   . Hypertension     Her father had prostate cancer, paternal aunt had breast cancer, paternal grandmother had cervical cancer, maternal grandmother had ovarian cancer, and her sister had breast cancer. Sister reports receiving genetic testing in 2016 herself and the results were negative.   SOCIAL HISTORY:  reports that she has quit smoking. She has never used smokeless tobacco. She reports that she does not drink alcohol or use drugs. The patient is single and lives in [place].   ALLERGIES: Oxycodone hcl; Propoxyphene n-acetaminophen; Tramadol; Adhesive [tape]; Aspirin; and Prednisone   MEDICATIONS:  Current Outpatient Prescriptions  Medication Sig Dispense Refill  . acetaminophen (TYLENOL) 500 MG tablet Take 1,000 mg by mouth every 6 (six) hours as needed (pain).    Marland Kitchen albuterol (PROAIR HFA) 108 (90 Base) MCG/ACT inhaler Inhale 2 puffs into the lungs every 6 (six) hours as needed for wheezing or shortness of breath.    . Calcium Citrate-Vitamin D (CALCIUM CITRATE +D PO) Take 1 tablet by mouth daily. Calcium 600 mg    . gabapentin (NEURONTIN) 300 MG capsule Take 300 mg by mouth 3 (three) times daily.    Marland Kitchen lisinopril (PRINIVIL,ZESTRIL) 20 MG tablet Take 20 mg by mouth daily.    . mometasone (ELOCON) 0.1 % cream Apply 1 application topically daily as needed (rash - summer eczema). Apply to arms    . Multiple Vitamin (MULTIVITAMIN WITH MINERALS) TABS tablet Take 1 tablet by mouth at bedtime.    . Multiple Vitamins-Iron (MULTI-VITAMIN/IRON PO) Take 1 tablet by mouth daily.    Marland Kitchen oxybutynin (DITROPAN) 5 MG tablet Take 5 mg by mouth 2 (two) times daily.    . silver sulfADIAZINE (SILVADENE) 1 % cream Apply 1 application topically 2 (two) times daily as needed (stomach tears).    . methocarbamol (ROBAXIN) 500 MG tablet Take 500 mg by mouth 4 (four) times daily.     No current facility-administered medications for this encounter.       REVIEW OF SYSTEMS: On review of systems, the patient reports that she is doing well overall. She denies any chest pain, shortness of breath, cough, fevers, chills, night sweats, unintended weight changes. She denies any bowel or bladder disturbances, and denies abdominal pain, nausea or vomiting. She denies any new musculoskeletal or joint aches or pains. Patient reports occasional sharp pain in the left chest wall radiating to the nipple. She denies lymphedema at this time. She denies bleeding or discharge of nipples, or signs of infection. A complete review of systems is obtained and is otherwise negative.   PHYSICAL EXAM:  Wt Readings from Last 3  Encounters:  07/12/16 200 lb 9.6 oz (91 kg)  07/10/16 197 lb 14.4 oz (89.8 kg)  12/08/15 194 lb (88 kg)   Temp Readings from Last 3 Encounters:  07/12/16 98.7 F (37.1 C) (Oral)  07/10/16 98.2 F (36.8 C) (Oral)  12/08/15 99.5 F (37.5 C) (Oral)   BP Readings from Last 3 Encounters:  07/12/16 121/67  07/10/16 (!) 117/52  12/08/15 121/71   Pulse Readings from Last 3 Encounters:  07/12/16 62  07/10/16 98  12/08/15 72   Pain Assessment Pain Score: 0-No pain/10  In general this is a well appearing african american woman in no acute distress. She's alert and oriented x4 and appropriate throughout the examination. Cardiopulmonary assessment is negative for acute distress and she exhibits normal effort. She is ambulatory with a cane. The patient declined additonal exam today.  ECOG = 1  0 - Asymptomatic (Fully active, able to carry on all predisease activities without restriction)  1 - Symptomatic but completely ambulatory (Restricted in physically strenuous activity but ambulatory and able to carry out work of a light or sedentary nature. For example, light housework, office work)  2 - Symptomatic, <50% in bed during the day (Ambulatory and capable of all self care but unable to carry out any work activities. Up and about more than  50% of waking hours)  3 - Symptomatic, >50% in bed, but not bedbound (Capable of only limited self-care, confined to bed or chair 50% or more of waking hours)  4 - Bedbound (Completely disabled. Cannot carry on any self-care. Totally confined to bed or chair)  5 - Death   Eustace Pen MM, Creech RH, Tormey DC, et al. [phone]). "Toxicity and response criteria of the [facility]". [place] Oncol. 5 (6): 649-55    LABORATORY DATA:  Lab Results  Component Value Date   WBC 4.8 10/01/2015   HGB 11.6 (L) 10/01/2015   HCT 35.2 (L) 10/01/2015   MCV 93.6 10/01/2015   PLT 161 10/01/2015   Lab Results  Component Value Date   NA 142 10/01/2015   K 3.8 10/01/2015   CL 104 10/01/2015   CO2 29 10/01/2015   Lab Results  Component Value Date   ALT 12 (L) 10/01/2015   AST 16 10/01/2015   ALKPHOS 51 10/01/2015   BILITOT 0.8 10/01/2015      RADIOGRAPHY: US Breast Ltd Uni Left Inc Axilla  Result Date: 06/22/2016 CLINICAL DATA:  56 year old presenting with a palpable lump in the outer left breast at posterior depth. Annual evaluation, right breast. Patient also complains of diffuse left breast pain. EXAM: 2D DIGITAL DIAGNOSTIC BILATERAL MAMMOGRAM WITH CAD AND ADJUNCT TOMO ULTRASOUND LEFT BREAST COMPARISON:  Mammography 03/21/2015, 12/16/2013 and earlier. No prior ultrasound. ACR Breast Density Category d: The breast tissue is extremely dense, which lowers the sensitivity of mammography. FINDINGS: Standard 2D and tomosynthesis full field CC and MLO views of both breasts were obtained. A standard and tomosynthesis spot tangential view of the area of concern in the left breast was also obtained. Corresponding to the area of palpable concern is a new focal asymmetry or mass in the outer left breast at posterior depth. No suspicious findings elsewhere in the left breast. No findings suspicious for malignancy in the right breast. Mammographic images were processed with CAD. On physical  exam, there is a palpable approximate 1-2 cm lump in the outer left breast corresponding to what the patient is feeling. Targeted left breast ultrasound is performed, showing a  hypoechoic mass with irregular margins demonstrating internal power Doppler flow and slight acoustic enhancement at the 3 o'clock position approximately 6 cm from the nipple measuring approximately 0.9 x 1.2 x 1.8 cm, corresponding to the palpable concern and the mammographic abnormality. Sonographic evaluation of the left axilla demonstrates no pathologic lymphadenopathy. IMPRESSION: 1. Suspicious approximate 1.8 cm mass involving the outer left breast at the 3 o'clock position approximately 6 cm from the nipple corresponding to the palpable concern. 2. No pathologic left axillary lymphadenopathy. 3. No mammographic evidence of malignancy, right breast. RECOMMENDATION: Ultrasound-guided core needle biopsy of the suspicious left breast mass. The ultrasound biopsy procedure was discussed with the patient and her questions were answered. She has agreed to proceed and this has been scheduled for Tuesday, February 6. I have discussed the findings and recommendations with the patient. Results were also provided in writing at the conclusion of the visit. BI-RADS CATEGORY  5: Highly suggestive of malignancy. Electronically Signed   By: Evangeline Dakin M.D.   On: 06/22/2016 14:44   Mm Diag Breast Tomo Bilateral  Result Date: 06/22/2016 CLINICAL DATA:  56 year old presenting with a palpable lump in the outer left breast at posterior depth. Annual evaluation, right breast. Patient also complains of diffuse left breast pain. EXAM: 2D DIGITAL DIAGNOSTIC BILATERAL MAMMOGRAM WITH CAD AND ADJUNCT TOMO ULTRASOUND LEFT BREAST COMPARISON:  Mammography 03/21/2015, 12/16/2013 and earlier. No prior ultrasound. ACR Breast Density Category d: The breast tissue is extremely dense, which lowers the sensitivity of mammography. FINDINGS: Standard 2D and  tomosynthesis full field CC and MLO views of both breasts were obtained. A standard and tomosynthesis spot tangential view of the area of concern in the left breast was also obtained. Corresponding to the area of palpable concern is a new focal asymmetry or mass in the outer left breast at posterior depth. No suspicious findings elsewhere in the left breast. No findings suspicious for malignancy in the right breast. Mammographic images were processed with CAD. On physical exam, there is a palpable approximate 1-2 cm lump in the outer left breast corresponding to what the patient is feeling. Targeted left breast ultrasound is performed, showing a hypoechoic mass with irregular margins demonstrating internal power Doppler flow and slight acoustic enhancement at the 3 o'clock position approximately 6 cm from the nipple measuring approximately 0.9 x 1.2 x 1.8 cm, corresponding to the palpable concern and the mammographic abnormality. Sonographic evaluation of the left axilla demonstrates no pathologic lymphadenopathy. IMPRESSION: 1. Suspicious approximate 1.8 cm mass involving the outer left breast at the 3 o'clock position approximately 6 cm from the nipple corresponding to the palpable concern. 2. No pathologic left axillary lymphadenopathy. 3. No mammographic evidence of malignancy, right breast. RECOMMENDATION: Ultrasound-guided core needle biopsy of the suspicious left breast mass. The ultrasound biopsy procedure was discussed with the patient and her questions were answered. She has agreed to proceed and this has been scheduled for Tuesday, February 6. I have discussed the findings and recommendations with the patient. Results were also provided in writing at the conclusion of the visit. BI-RADS CATEGORY  5: Highly suggestive of malignancy. Electronically Signed   By: Evangeline Dakin M.D.   On: 06/22/2016 14:44   Mm Clip Placement Left  Result Date: 06/26/2016 CLINICAL DATA:  Ultrasound-guided biopsy was  performed of a suspicious left breast mass. EXAM: DIAGNOSTIC LEFT MAMMOGRAM POST ULTRASOUND BIOPSY COMPARISON:  Previous exam(s). FINDINGS: Mammographic images were obtained following ultrasound guided biopsy of a left breast mass at 3 o'clock position.  A ribbon shaped biopsy clip is satisfactorily positioned within the mass. IMPRESSION: Satisfactory position of biopsy clip. Final Assessment: Post Procedure Mammograms for Marker Placement Electronically Signed   By: Curlene Dolphin M.D.   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 This was found to be concordant by Dr. Curlene Dolphin. Pathology results were discussed with the patient by telephone. The patient reported doing well after the biopsy. Post biopsy instructions and care were reviewed and questions were answered. The patient was encouraged to call [facility] for any additional concerns. Surgical consultation has been arranged with Dr. Erroll Luna at [facility] on July 02, 2016. Pathology results reported by Susa Raring RN, BSN on 06/28/2016. Electronically Signed   By: Curlene Dolphin M.D.   On: 06/28/2016 07:53   Result Date: 06/29/2016 CLINICAL DATA:  Suspicious mass 3 o'clock position left breast 6 cm from the nipple, for which biopsy was recommended. EXAM: ULTRASOUND GUIDED LEFT BREAST CORE NEEDLE BIOPSY COMPARISON:  Previous exam(s). FINDINGS: I met with the patient and we discussed the procedure of ultrasound-guided biopsy, including benefits and alternatives. We discussed the high likelihood of a successful procedure. We discussed the risks of the procedure, including infection, bleeding, tissue injury, clip migration, and inadequate sampling. Informed written consent was given.  The usual time-out protocol was performed immediately prior to the procedure. Using sterile technique and 1% Lidocaine as local anesthetic, under direct ultrasound visualization, a 14 gauge spring-loaded device was used to perform biopsy of a 1.8 x 1.2 x 0.9 cm mass at 3 o'clock position using a lateral to medial approach. At the conclusion of the procedure a tissue marker clip was deployed into the biopsy cavity. Follow up 2 view mammogram was performed and dictated separately. IMPRESSION: Ultrasound guided biopsy of the left breast. No apparent complications. Electronically Signed: By: Curlene Dolphin M.D. On: 06/26/2016 16:38       IMPRESSION/PLAN: 1. Stage IA, cT1c, N0, Mx , ER/PR positive grade 3 invasive ductal carcinoma of the left breast. Dr. Lisbeth Renshaw discusses the pathology findings and reviews the nature of invasive breast disease. The consensus from the breast conference include breast conservation with lumpectomy with sentinel mapping. Dr. Lindi Adie has recommended oncotype testing which will be performed on her pathology specimen. This information would determine if there is a role for chemotherapy. Provided that chemotherapy is not indicated, the patient's course would then be followed by external radiotherapy to the breast followed by antiestrogen therapy. We discussed the risks, benefits, short, and long term effects of radiotherapy, and the patient is interested in proceeding. Dr. Lisbeth Renshaw discusses the delivery and logistics of radiotherapy.We would anticipate a course of 6 1/2 weeks of treatment, and use breath hold technique to avoid exposure to the heart. We will see her back about 2-3 weeks after surgery or chemotherapy if needed to move forward with the simulation and planning process and anticipate starting radiotherapy about 4 weeks after her surgical or systemic therapy.  2. Possible genetic predisposition to malignancy. The patient's family and personal history were reviewed. Although her  sister had negative genetic testing in 2016, we will reach out to our genetic counselor, Roma Kayser to determine if she needs additional testing. Referral will be made according to recommendations,  and the patient is in agreement.  The above documentation reflects my direct findings during this shared patient visit. Please see the separate note by Dr. Lisbeth Renshaw on this date for the remainder of the patient's plan of care.    Carola Rhine, PAC  This document serves as a record of services personally performed by Shona Simpson, PA-C and Kyung Rudd, MD. It was created on their behalf by Bethann Humble, a trained medical scribe. The creation of this record is based on the scribe's personal observations and the provider's statements to them. This document has been checked and approved by the attending provider.

## 2016-07-16 ENCOUNTER — Telehealth: Payer: Self-pay | Admitting: Radiation Oncology

## 2016-07-16 DIAGNOSIS — C50512 Malignant neoplasm of lower-outer quadrant of left female breast: Secondary | ICD-10-CM

## 2016-07-16 DIAGNOSIS — Z170 Estrogen receptor positive status [ER+]: Secondary | ICD-10-CM

## 2016-07-16 NOTE — Telephone Encounter (Signed)
-----   Message from Clarene Essex, Counselor sent at 07/12/2016  2:45 PM EST ----- Yes, I would test her.  Thanks! ----- Message ----- From: Hayden Pedro, PA-C Sent: 07/12/2016   9:51 AM To: Clarene Essex, Counselor  Santiago Glad- can you look at my note and her history to see if she needs testing? Her sister is Jovon Magadan DOB 01/31/63 (i have her permission to bring this up per Delcie Roch) whom you've previously tested and who was negative. Thanks, Bryson Ha

## 2016-07-16 NOTE — Telephone Encounter (Signed)
LM for pt regarding referral to Genetic counseling. Orders placed and scheduling notified.

## 2016-07-18 ENCOUNTER — Encounter (HOSPITAL_BASED_OUTPATIENT_CLINIC_OR_DEPARTMENT_OTHER): Payer: Self-pay | Admitting: *Deleted

## 2016-07-18 NOTE — Progress Notes (Signed)
Patient will need cardiac clearance from Dr Stanford Breed before surgery. She has hx PFO, has IVC filter in place and was seen in ED for chest pain in May 2017 with instructions to followup with cards and did not. Stephanie at Dr [employer] office notified.

## 2016-07-19 ENCOUNTER — Telehealth: Payer: Self-pay

## 2016-07-19 ENCOUNTER — Telehealth: Payer: Self-pay | Admitting: Cardiology

## 2016-07-19 HISTORY — PX: BREAST LUMPECTOMY: SHX2

## 2016-07-19 NOTE — Telephone Encounter (Signed)
07/19/2016 Received faxed records from [facility] for upcoming appointment with Kerin Ransom, PA-C

## 2016-07-19 NOTE — Telephone Encounter (Signed)
Pt saw Shona Simpson on 2/26 and she was told she would be scheduled for genetics. She has not heard back yet. Saw Alison's note that order placed and scheduler notified. Instructed pt to wait a few more days for scheduler to call.

## 2016-07-23 ENCOUNTER — Encounter: Payer: Self-pay | Admitting: Cardiology

## 2016-07-23 ENCOUNTER — Ambulatory Visit (INDEPENDENT_AMBULATORY_CARE_PROVIDER_SITE_OTHER): Payer: Medicare Other | Admitting: Cardiology

## 2016-07-23 DIAGNOSIS — Z9884 Bariatric surgery status: Secondary | ICD-10-CM

## 2016-07-23 DIAGNOSIS — I10 Essential (primary) hypertension: Secondary | ICD-10-CM

## 2016-07-23 DIAGNOSIS — Q211 Atrial septal defect: Secondary | ICD-10-CM

## 2016-07-23 DIAGNOSIS — C50512 Malignant neoplasm of lower-outer quadrant of left female breast: Secondary | ICD-10-CM

## 2016-07-23 DIAGNOSIS — Q2112 Patent foramen ovale: Secondary | ICD-10-CM

## 2016-07-23 DIAGNOSIS — R0789 Other chest pain: Secondary | ICD-10-CM | POA: Diagnosis not present

## 2016-07-23 DIAGNOSIS — Z95828 Presence of other vascular implants and grafts: Secondary | ICD-10-CM | POA: Diagnosis not present

## 2016-07-23 DIAGNOSIS — Z01818 Encounter for other preprocedural examination: Secondary | ICD-10-CM | POA: Insufficient documentation

## 2016-07-23 DIAGNOSIS — Z8673 Personal history of transient ischemic attack (TIA), and cerebral infarction without residual deficits: Secondary | ICD-10-CM | POA: Diagnosis not present

## 2016-07-23 DIAGNOSIS — Z170 Estrogen receptor positive status [ER+]: Secondary | ICD-10-CM

## 2016-07-23 DIAGNOSIS — R079 Chest pain, unspecified: Secondary | ICD-10-CM

## 2016-07-23 DIAGNOSIS — Z86711 Personal history of pulmonary embolism: Secondary | ICD-10-CM | POA: Diagnosis not present

## 2016-07-23 NOTE — Assessment & Plan Note (Signed)
History of PFO

## 2016-07-23 NOTE — Assessment & Plan Note (Signed)
Pt seen for pre op clearance prior to partial mastectomy

## 2016-07-23 NOTE — Patient Instructions (Addendum)
Medication Instructions:  Your physician recommends that you continue on your current medications as directed. Please refer to the Current Medication list given to you today.  Labwork: none  Testing/Procedures: none  Follow-Up: Your physician wants you to follow-up in: 1 year ov You will receive a reminder letter in the mail two months in advance. If you don't receive a letter, please call our office to schedule the follow-up appointment.  Any Other Special Instructions Will Be Listed Below (If Applicable). YOU ARE CLEARED FROM A CARDIAC STANDPOINT FOR YOUR UPCOMING SURGERY WITH DR Brantley Stage  If you need a refill on your cardiac medications before your next appointment, please call your pharmacy.

## 2016-07-23 NOTE — Assessment & Plan Note (Signed)
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

## 2016-07-23 NOTE — Assessment & Plan Note (Signed)
IVC filter placed in 2009 prior to bariatric surgery

## 2016-07-23 NOTE — Assessment & Plan Note (Signed)
Pt has atypical chest pain, no exertional symptoms. Normal coronaries in 2003, negative Myoview May 2017.

## 2016-07-23 NOTE — Assessment & Plan Note (Signed)
For partial mastectomy and lymph node biopsy

## 2016-07-23 NOTE — Assessment & Plan Note (Signed)
Echo 2017- EF 60-65% with grade 2 DD

## 2016-07-23 NOTE — Progress Notes (Signed)
07/23/2016 [name]   08-Oct-1960  [id number]  Primary Physician [name], MD Primary Cardiologist: Dr Stanford Breed  HPI:  Pleasant 56 y/o AA female seen  By Dr Stanford Breed in the past. She had a cath in 2003 that was normal per records in [facility]. 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 Around this time she was diagnosed with a PFO but those records are not available to me. In Oct 2004 she had a PE/DVT and was placed on Coumadin. She remained on Coumadin till 2009. At that time she was taken off Coumadin and an IVC filter was placed prior to bariatric surgery. She had done well since in May 2017 she was admitted for chest pain. Myoview was low risk (no ischemia) and an echo showed normal LVF with grade 2 DD (no mention of a PFO).   She is in the office today for pre op clearance prior to partial mastectomy and lymph node biopsy. She occasionally gets chest pain, sometimes a "grabbing pain", sometimes a "sharp" pain. Her symptoms are not exertional and she does not feel limited in daily activity.    Current Outpatient Prescriptions  Medication Sig Dispense Refill  . acetaminophen (TYLENOL) 500 MG tablet Take 1,000 mg by mouth every 6 (six) hours as needed (pain).    Marland Kitchen albuterol (PROAIR HFA) 108 (90 Base) MCG/ACT inhaler Inhale 2 puffs into the lungs every 6 (six) hours as needed for wheezing or shortness of breath.    . Calcium Citrate-Vitamin D (CALCIUM CITRATE +D PO) Take 1 tablet by mouth daily. Calcium 600 mg    . cyclobenzaprine (FLEXERIL) 5 MG tablet Take 5 mg by mouth 3 (three) times daily as needed for muscle spasms.    Marland Kitchen gabapentin (NEURONTIN) 300 MG capsule Take 300 mg by mouth 3 (three) times daily.    Marland Kitchen lisinopril (PRINIVIL,ZESTRIL) 20 MG tablet Take 20 mg by mouth daily.    . mometasone (ELOCON) 0.1 % cream Apply 1 application topically daily as needed (rash - summer eczema). Apply to arms    . Multiple Vitamin (MULTIVITAMIN WITH MINERALS) TABS tablet Take  1 tablet by mouth at bedtime.    . Multiple Vitamins-Iron (MULTI-VITAMIN/IRON PO) Take 1 tablet by mouth daily.    Marland Kitchen oxybutynin (DITROPAN) 5 MG tablet Take 5 mg by mouth 2 (two) times daily.    . silver sulfADIAZINE (SILVADENE) 1 % cream Apply 1 application topically 2 (two) times daily as needed (stomach tears).     No current facility-administered medications for this visit.     Allergies  Allergen Reactions  . Oxycodone Hcl Diarrhea and Nausea And Vomiting  . Propoxyphene N-Acetaminophen Diarrhea and Nausea And Vomiting  . Tramadol Other (See Comments)    Cause migraines  . Adhesive [Tape] Rash and Other (See Comments)    Pulls skin off - please use paper tape  . Aspirin Other (See Comments)    ESOPHAGITIS  . Prednisone Rash    Past Medical History:  Diagnosis Date  . Arthritis    osteoarthritis  . Asthma    states no asthma attack since 2002  . Breast cancer ([place]) 06/26/16 bx   left breast  . Chronic back pain   . Complication of anesthesia    states takes more than normal to put her to sleep  . Dental bridge present    upper  . Dental crowns present   . DVT of upper extremity (deep vein thrombosis) ([place])   .  Fibromyalgia   . Headache(784.0)    migraines  . History of blood transfusion 06/2005  . History of gallstones   . History of pneumonia   . History of shingles 07/2011  . HTN (hypertension)   . Normal coronary arteries 2003  . PFO (patent foramen ovale)   . Presence of inferior vena cava filter   . Pulmonary embolism ([place])   . Sleep apnea    used CPAP until after bypass surg.  . Status post gastric bypass for obesity   . Stroke [facility]) 12/2002   right-sided weakness  . Urinary incontinence     Social History   Social History  . Marital status: Single    Spouse name: N/A  . Number of children: 0  . Years of education: N/A   Occupational History  . disabled    Social History Main Topics  . Smoking status: Former Research scientist (life sciences)  . Smokeless tobacco: Never  Used     Comment: quit smoking 08/1989  . Alcohol use No  . Drug use: No  . Sexual activity: No   Other Topics Concern  . Not on file   Social History Narrative  . No narrative on file     Family History  Problem Relation Age of Onset  . Breast cancer Paternal Aunt   . Breast cancer Paternal Grandmother   . Breast cancer Maternal Grandmother   . Colon polyps Father   . Diabetes Father     borderline  . Hypertension Mother   . Hypertension       Review of Systems: General: negative for chills, fever, night sweats or weight changes.  Cardiovascular: negative for dyspnea on exertion, edema, orthopnea, palpitations, paroxysmal nocturnal dyspnea or shortness of breath Dermatological: negative for rash Respiratory: negative for cough or wheezing Urologic: negative for hematuria Abdominal: negative for nausea, vomiting, diarrhea, bright red blood per rectum, melena, or hematemesis Neurologic: negative for visual changes, syncope, or dizziness, Rt leg weakness, uses a cane All other systems reviewed and are otherwise negative except as noted above.    There were no vitals taken for this visit.  General appearance: alert, cooperative, no distress and mildly obese Neck: no carotid bruit and no JVD Lungs: clear to auscultation bilaterally Heart: regular rate and rhythm Abdomen: soft, non-tender; bowel sounds normal; no masses,  no organomegaly and RUQ surgical scar Extremities: extremities normal, atraumatic, no cyanosis or edema and trace LE edema Pulses: 2+ and symmetric Skin: Skin color, texture, turgor normal. No rashes or lesions Neurologic: Grossly normal  EKG NSR-HR 60  ASSESSMENT AND PLAN:   Pre-operative clearance Pt seen for pre op clearance prior to partial mastectomy  Chest pain Pt has atypical chest pain, no exertional symptoms. Normal coronaries in 2003, negative Myoview May 2017.  History of stroke Lt brain stroke 2004. Residual Rt leg weakness, uses a  cane  History of pulmonary embolus (PE) PE Oct 2004- (3 months after her CVA)-on Coumadin till 2009. Chest CTA May 2017 negative for PE  [place] filter in place IVC filter placed in 2009 prior to bariatric surgery  Essential hypertension Echo 2017- EF 60-65% with grade 2 DD  Status post gastric bypass for obesity 2009- Coumadin stopped and IVC filter placed at that time  Malignant neoplasm of lower-outer quadrant of left breast of female, estrogen receptor positive ([place]) For partial mastectomy and lymph node biopsy  PFO (patent foramen ovale) History of PFO   PLAN  Discussed with Dr Stanford Breed in the office today. She  is an acceptable risk for surgery from a cardiovascular standpoint. With her diastolic dysfunction would try and avoid excess volume in there perioperative period. We will be available if needed. She'll f/u with Dr Stanford Breed in a year for HTN, diastolic dysfunction, and PFO/stroke history.    Kerin Ransom PA-C 07/23/2016 9:06 AM

## 2016-07-23 NOTE — Assessment & Plan Note (Addendum)
PE Oct 2004- (3 months after her CVA)-on Coumadin till 2009. Chest CTA May 2017 negative for PE

## 2016-07-23 NOTE — Assessment & Plan Note (Signed)
2009- Coumadin stopped and IVC filter placed at that time

## 2016-07-24 ENCOUNTER — Ambulatory Visit
Admission: RE | Admit: 2016-07-24 | Discharge: 2016-07-24 | Disposition: A | Discharge status: Home / Self Care | Payer: Medicare Other | Source: Ambulatory Visit | Attending: Surgery | Admitting: Surgery

## 2016-07-24 DIAGNOSIS — Z170 Estrogen receptor positive status [ER+]: Principal | ICD-10-CM

## 2016-07-24 DIAGNOSIS — C50412 Malignant neoplasm of upper-outer quadrant of left female breast: Secondary | ICD-10-CM

## 2016-07-24 DIAGNOSIS — R928 Other abnormal and inconclusive findings on diagnostic imaging of breast: Secondary | ICD-10-CM | POA: Diagnosis not present

## 2016-07-24 NOTE — Progress Notes (Signed)
Boost drink given with instructions to complete by 0800 dos/ pt verbalized understanding.

## 2016-07-25 NOTE — Progress Notes (Signed)
Pt called to come in for PAT labs, unable to come today. Will draw dos, Dr Cornett's office aware.

## 2016-07-26 ENCOUNTER — Encounter (HOSPITAL_BASED_OUTPATIENT_CLINIC_OR_DEPARTMENT_OTHER): Payer: Self-pay | Admitting: *Deleted

## 2016-07-26 ENCOUNTER — Encounter (HOSPITAL_BASED_OUTPATIENT_CLINIC_OR_DEPARTMENT_OTHER)
Admission: RE | Disposition: A | Discharge status: Home / Self Care | Payer: Self-pay | Source: Ambulatory Visit | Attending: Surgery

## 2016-07-26 ENCOUNTER — Ambulatory Visit (HOSPITAL_BASED_OUTPATIENT_CLINIC_OR_DEPARTMENT_OTHER)
Admission: RE | Admit: 2016-07-26 | Discharge: 2016-07-26 | Disposition: A | Discharge status: Home / Self Care | Payer: Medicare Other | Source: Ambulatory Visit | Attending: Surgery | Admitting: Surgery

## 2016-07-26 ENCOUNTER — Ambulatory Visit
Admission: RE | Admit: 2016-07-26 | Discharge: 2016-07-26 | Disposition: A | Discharge status: Home / Self Care | Payer: Medicare Other | Source: Ambulatory Visit | Attending: Surgery | Admitting: Surgery

## 2016-07-26 ENCOUNTER — Ambulatory Visit (HOSPITAL_BASED_OUTPATIENT_CLINIC_OR_DEPARTMENT_OTHER): Payer: Medicare Other | Admitting: Anesthesiology

## 2016-07-26 ENCOUNTER — Encounter (HOSPITAL_COMMUNITY)
Admission: RE | Admit: 2016-07-26 | Discharge: 2016-07-26 | Disposition: A | Discharge status: Home / Self Care | Payer: Medicare Other | Source: Ambulatory Visit | Attending: Surgery | Admitting: Surgery

## 2016-07-26 DIAGNOSIS — Z803 Family history of malignant neoplasm of breast: Secondary | ICD-10-CM | POA: Diagnosis not present

## 2016-07-26 DIAGNOSIS — C50412 Malignant neoplasm of upper-outer quadrant of left female breast: Secondary | ICD-10-CM

## 2016-07-26 DIAGNOSIS — G4730 Sleep apnea, unspecified: Secondary | ICD-10-CM | POA: Diagnosis not present

## 2016-07-26 DIAGNOSIS — E669 Obesity, unspecified: Secondary | ICD-10-CM | POA: Insufficient documentation

## 2016-07-26 DIAGNOSIS — R928 Other abnormal and inconclusive findings on diagnostic imaging of breast: Secondary | ICD-10-CM | POA: Diagnosis not present

## 2016-07-26 DIAGNOSIS — C50912 Malignant neoplasm of unspecified site of left female breast: Secondary | ICD-10-CM | POA: Diagnosis not present

## 2016-07-26 DIAGNOSIS — G8918 Other acute postprocedural pain: Secondary | ICD-10-CM | POA: Diagnosis not present

## 2016-07-26 DIAGNOSIS — Z8673 Personal history of transient ischemic attack (TIA), and cerebral infarction without residual deficits: Secondary | ICD-10-CM | POA: Insufficient documentation

## 2016-07-26 DIAGNOSIS — J45909 Unspecified asthma, uncomplicated: Secondary | ICD-10-CM | POA: Diagnosis not present

## 2016-07-26 DIAGNOSIS — I739 Peripheral vascular disease, unspecified: Secondary | ICD-10-CM | POA: Insufficient documentation

## 2016-07-26 DIAGNOSIS — Z170 Estrogen receptor positive status [ER+]: Secondary | ICD-10-CM | POA: Diagnosis not present

## 2016-07-26 DIAGNOSIS — Z6837 Body mass index (BMI) 37.0-37.9, adult: Secondary | ICD-10-CM | POA: Diagnosis not present

## 2016-07-26 DIAGNOSIS — Z79899 Other long term (current) drug therapy: Secondary | ICD-10-CM | POA: Insufficient documentation

## 2016-07-26 DIAGNOSIS — Z9989 Dependence on other enabling machines and devices: Secondary | ICD-10-CM | POA: Diagnosis not present

## 2016-07-26 DIAGNOSIS — I10 Essential (primary) hypertension: Secondary | ICD-10-CM | POA: Insufficient documentation

## 2016-07-26 DIAGNOSIS — Z87891 Personal history of nicotine dependence: Secondary | ICD-10-CM | POA: Insufficient documentation

## 2016-07-26 HISTORY — PX: BREAST LUMPECTOMY WITH RADIOACTIVE SEED AND SENTINEL LYMPH NODE BIOPSY: SHX6550

## 2016-07-26 LAB — COMPREHENSIVE METABOLIC PANEL
ALT: 14 U/L (ref 14–54)
AST: 23 U/L (ref 15–41)
Albumin: 4.3 g/dL (ref 3.5–5.0)
Alkaline Phosphatase: 51 U/L (ref 38–126)
Anion gap: 11 (ref 5–15)
BUN: 16 mg/dL (ref 6–20)
CO2: 22 mmol/L (ref 22–32)
Calcium: 9 mg/dL (ref 8.9–10.3)
Chloride: 106 mmol/L (ref 101–111)
Creatinine, Ser: 0.64 mg/dL (ref 0.44–1.00)
GFR calc Af Amer: >60 mL/min (ref >60)
GFR calc non Af Amer: >60 mL/min (ref >60)
Glucose, Bld: 63 mg/dL — ABNORMAL LOW (ref 65–99)
Potassium: 3.2 mmol/L — ABNORMAL LOW (ref 3.5–5.1)
Sodium: 139 mmol/L (ref 135–145)
Total Bilirubin: 0.9 mg/dL (ref 0.3–1.2)
Total Protein: 7.2 g/dL (ref 6.5–8.1)

## 2016-07-26 LAB — CBC WITH DIFFERENTIAL/PLATELET
Basophils Absolute: 0 10*3/uL (ref 0.0–0.1)
Basophils Relative: 0 %
Eosinophils Absolute: 0.1 10*3/uL (ref 0.0–0.7)
Eosinophils Relative: 1 %
HCT: 38.4 % (ref 36.0–46.0)
Hemoglobin: 12.5 g/dL (ref 12.0–15.0)
Lymphocytes Relative: 30 %
Lymphs Abs: 1.6 10*3/uL (ref 0.7–4.0)
MCH: 31.4 pg (ref 26.0–34.0)
MCHC: 32.6 g/dL (ref 30.0–36.0)
MCV: 96.5 fL (ref 78.0–100.0)
Monocytes Absolute: 0.3 10*3/uL (ref 0.1–1.0)
Monocytes Relative: 6 %
Neutro Abs: 3.4 10*3/uL (ref 1.7–7.7)
Neutrophils Relative %: 63 %
Platelets: 192 10*3/uL (ref 150–400)
RBC: 3.98 MIL/uL (ref 3.87–5.11)
RDW: 13.6 % (ref 11.5–15.5)
WBC: 5.4 10*3/uL (ref 4.0–10.5)

## 2016-07-26 SURGERY — BREAST LUMPECTOMY WITH RADIOACTIVE SEED AND SENTINEL LYMPH NODE BIOPSY
Anesthesia: General | Site: Breast | Laterality: Left

## 2016-07-26 MED ORDER — BUPIVACAINE-EPINEPHRINE (PF) 0.5% -1:200000 IJ SOLN
INTRAMUSCULAR | Status: DC | PRN
  Administered 2016-07-26: 10 mL

## 2016-07-26 MED ORDER — MIDAZOLAM HCL 2 MG/2ML IJ SOLN
1.0000 mg | INTRAMUSCULAR | Status: DC | PRN
  Administered 2016-07-26: 2 mg via INTRAVENOUS

## 2016-07-26 MED ORDER — 0.9 % SODIUM CHLORIDE (POUR BTL) OPTIME
TOPICAL | Status: DC | PRN
  Administered 2016-07-26: 300 mL

## 2016-07-26 MED ORDER — LIDOCAINE 2% (20 MG/ML) 5 ML SYRINGE
INTRAMUSCULAR | Status: AC
  Filled 2016-07-26: qty 5

## 2016-07-26 MED ORDER — FENTANYL CITRATE (PF) 100 MCG/2ML IJ SOLN
INTRAMUSCULAR | Status: AC
  Filled 2016-07-26: qty 2

## 2016-07-26 MED ORDER — NALOXONE HCL 0.4 MG/ML IJ SOLN
INTRAMUSCULAR | Status: AC
  Filled 2016-07-26: qty 1

## 2016-07-26 MED ORDER — SCOPOLAMINE 1 MG/3DAYS TD PT72: 1.0000 | MEDICATED_PATCH | Freq: Once | TRANSDERMAL | Status: DC | PRN

## 2016-07-26 MED ORDER — FENTANYL CITRATE (PF) 100 MCG/2ML IJ SOLN
50.0000 ug | INTRAMUSCULAR | Status: DC | PRN
  Administered 2016-07-26: 25 ug via INTRAVENOUS

## 2016-07-26 MED ORDER — NALOXONE HCL 0.4 MG/ML IJ SOLN
0.1000 mg | INTRAMUSCULAR | Status: DC | PRN
  Administered 2016-07-26: 0.1 mg via INTRAVENOUS

## 2016-07-26 MED ORDER — CHLORHEXIDINE GLUCONATE CLOTH 2 % EX PADS: 6.0000 | MEDICATED_PAD | Freq: Once | CUTANEOUS | Status: DC

## 2016-07-26 MED ORDER — LIDOCAINE 2% (20 MG/ML) 5 ML SYRINGE
INTRAMUSCULAR | Status: DC | PRN
  Administered 2016-07-26: 100 mg via INTRAVENOUS

## 2016-07-26 MED ORDER — DEXTROSE 5 % IV SOLN
3.0000 g | INTRAVENOUS | Status: AC
  Administered 2016-07-26: 2 g via INTRAVENOUS

## 2016-07-26 MED ORDER — MIDAZOLAM HCL 2 MG/2ML IJ SOLN
INTRAMUSCULAR | Status: AC
  Filled 2016-07-26: qty 2

## 2016-07-26 MED ORDER — GABAPENTIN 300 MG PO CAPS
ORAL_CAPSULE | ORAL | Status: AC
  Filled 2016-07-26: qty 1

## 2016-07-26 MED ORDER — METOCLOPRAMIDE HCL 5 MG/ML IJ SOLN: 10.0000 mg | Freq: Once | INTRAMUSCULAR | Status: DC | PRN

## 2016-07-26 MED ORDER — CEFAZOLIN SODIUM-DEXTROSE 2-4 GM/100ML-% IV SOLN
INTRAVENOUS | Status: AC
  Filled 2016-07-26: qty 100

## 2016-07-26 MED ORDER — GABAPENTIN 300 MG PO CAPS
300.0000 mg | ORAL_CAPSULE | ORAL | Status: AC
  Administered 2016-07-26: 300 mg via ORAL

## 2016-07-26 MED ORDER — FENTANYL CITRATE (PF) 100 MCG/2ML IJ SOLN
25.0000 ug | INTRAMUSCULAR | Status: DC | PRN
  Administered 2016-07-26 (×4): 50 ug via INTRAVENOUS

## 2016-07-26 MED ORDER — TECHNETIUM TC 99M SULFUR COLLOID FILTERED
1.0000 | Freq: Once | INTRAVENOUS | Status: AC | PRN
  Administered 2016-07-26: 1 via INTRADERMAL

## 2016-07-26 MED ORDER — MIDAZOLAM HCL 2 MG/2ML IJ SOLN: 1.0000 mg | INTRAMUSCULAR | Status: DC | PRN

## 2016-07-26 MED ORDER — LACTATED RINGERS IV SOLN: INTRAVENOUS | Status: DC

## 2016-07-26 MED ORDER — CELECOXIB 100 MG PO CAPS: 100.0000 mg | ORAL_CAPSULE | Freq: Two times a day (BID) | ORAL | 0 refills | Status: DC

## 2016-07-26 MED ORDER — HYDROCODONE-ACETAMINOPHEN 7.5-325 MG PO TABS: 1.0000 | ORAL_TABLET | Freq: Once | ORAL | Status: DC | PRN

## 2016-07-26 MED ORDER — ONDANSETRON HCL 4 MG/2ML IJ SOLN
INTRAMUSCULAR | Status: DC | PRN
  Administered 2016-07-26: 4 mg via INTRAVENOUS

## 2016-07-26 MED ORDER — SUCCINYLCHOLINE CHLORIDE 200 MG/10ML IV SOSY
PREFILLED_SYRINGE | INTRAVENOUS | Status: AC
  Filled 2016-07-26: qty 10

## 2016-07-26 MED ORDER — LACTATED RINGERS IV SOLN
INTRAVENOUS | Status: DC
  Administered 2016-07-26: 11:00:00 via INTRAVENOUS

## 2016-07-26 MED ORDER — BUPIVACAINE-EPINEPHRINE (PF) 0.5% -1:200000 IJ SOLN
INTRAMUSCULAR | Status: DC | PRN
  Administered 2016-07-26: 30 mL via PERINEURAL

## 2016-07-26 MED ORDER — FENTANYL CITRATE (PF) 100 MCG/2ML IJ SOLN
50.0000 ug | INTRAMUSCULAR | Status: DC | PRN
  Administered 2016-07-26: 100 ug via INTRAVENOUS

## 2016-07-26 MED ORDER — PROPOFOL 10 MG/ML IV BOLUS
INTRAVENOUS | Status: DC | PRN
  Administered 2016-07-26: 150 mg via INTRAVENOUS

## 2016-07-26 MED ORDER — MEPERIDINE HCL 25 MG/ML IJ SOLN: 6.2500 mg | INTRAMUSCULAR | Status: DC | PRN

## 2016-07-26 SURGICAL SUPPLY — 45 items
ADH SKN CLS APL DERMABOND .7 (GAUZE/BANDAGES/DRESSINGS) ×1
APPLIER CLIP 9.375 MED OPEN (MISCELLANEOUS) ×2
APR CLP MED 9.3 20 MLT OPN (MISCELLANEOUS) ×1
BINDER BREAST XXLRG (GAUZE/BANDAGES/DRESSINGS) ×1 IMPLANT
BLADE SURG 15 STRL LF DISP TIS (BLADE) ×1 IMPLANT
BLADE SURG 15 STRL SS (BLADE) ×2
CANISTER SUCT 1200ML W/VALVE (MISCELLANEOUS) ×2 IMPLANT
CHLORAPREP W/TINT 26ML (MISCELLANEOUS) ×2 IMPLANT
CLIP APPLIE 9.375 MED OPEN (MISCELLANEOUS) ×1 IMPLANT
COVER BACK TABLE 60X90IN (DRAPES) ×2 IMPLANT
COVER MAYO STAND STRL (DRAPES) ×2 IMPLANT
COVER PROBE W GEL 5X96 (DRAPES) ×2 IMPLANT
COVER SURGICAL LIGHT HANDLE (MISCELLANEOUS) ×1 IMPLANT
DERMABOND ADVANCED (GAUZE/BANDAGES/DRESSINGS) ×1
DERMABOND ADVANCED .7 DNX12 (GAUZE/BANDAGES/DRESSINGS) ×1 IMPLANT
DEVICE DUBIN W/COMP PLATE 8390 (MISCELLANEOUS) ×2 IMPLANT
DRAPE LAPAROSCOPIC ABDOMINAL (DRAPES) ×2 IMPLANT
DRAPE UTILITY XL STRL (DRAPES) ×2 IMPLANT
ELECT COATED BLADE 2.86 ST (ELECTRODE) ×2 IMPLANT
ELECT REM PT RETURN 9FT ADLT (ELECTROSURGICAL) ×2
ELECTRODE REM PT RTRN 9FT ADLT (ELECTROSURGICAL) ×1 IMPLANT
GLOVE BIO SURGEON STRL SZ 6.5 (GLOVE) ×1 IMPLANT
GLOVE BIOGEL PI IND STRL 7.0 (GLOVE) IMPLANT
GLOVE BIOGEL PI IND STRL 8 (GLOVE) ×1 IMPLANT
GLOVE BIOGEL PI INDICATOR 7.0 (GLOVE) ×1
GLOVE BIOGEL PI INDICATOR 8 (GLOVE) ×1
GLOVE ECLIPSE 8.0 STRL XLNG CF (GLOVE) ×2 IMPLANT
GOWN STRL REUS W/ TWL LRG LVL3 (GOWN DISPOSABLE) ×2 IMPLANT
GOWN STRL REUS W/TWL LRG LVL3 (GOWN DISPOSABLE) ×4
HEMOSTAT SNOW SURGICEL 2X4 (HEMOSTASIS) ×2 IMPLANT
KIT MARKER MARGIN INK (KITS) ×2 IMPLANT
NDL HYPO 25X1 1.5 SAFETY (NEEDLE) ×1 IMPLANT
NEEDLE HYPO 25X1 1.5 SAFETY (NEEDLE) ×2 IMPLANT
NS IRRIG 1000ML POUR BTL (IV SOLUTION) ×2 IMPLANT
PACK BASIN DAY SURGERY FS (CUSTOM PROCEDURE TRAY) ×2 IMPLANT
PENCIL BUTTON HOLSTER BLD 10FT (ELECTRODE) ×2 IMPLANT
SLEEVE SCD COMPRESS KNEE MED (MISCELLANEOUS) ×2 IMPLANT
SPONGE LAP 4X18 X RAY DECT (DISPOSABLE) ×2 IMPLANT
SUT MNCRL AB 4-0 PS2 18 (SUTURE) ×2 IMPLANT
SUT VICRYL 3-0 CR8 SH (SUTURE) ×2 IMPLANT
SYR CONTROL 10ML LL (SYRINGE) ×2 IMPLANT
TOWEL OR 17X24 6PK STRL BLUE (TOWEL DISPOSABLE) ×2 IMPLANT
TOWEL OR NON WOVEN STRL DISP B (DISPOSABLE) ×2 IMPLANT
TUBE CONNECTING 20X1/4 (TUBING) ×2 IMPLANT
YANKAUER SUCT BULB TIP NO VENT (SUCTIONS) ×2 IMPLANT

## 2016-07-26 NOTE — H&P (View-Only) (Signed)
MELL MELLOTT 07/02/2016 9:23 AM Location: [facility] Patient #: [id number] DOB: [birth year] Single / Language: [name] / Race: Black or African American Female  History of Present Illness [name] A. Tobey Schmelzle MD; 07/02/2016 10:38 AM) Patient words: Patient sent at the request of Dr. Radford Pax from the [facility] due to poor mammographic abnormality in the left breast. Patient's been having pain in her left breast since December. Mammogram and ultrasound were obtained which showed a 1.8 cm mass left breast upper quadrant with a negative axilla. 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 Her sister had breast cancer in 2016 and I follow her as well. She states the areas been sore often on prior to the biopsy. She states sometimes her breast feels like it's going to explode. She has a history of chronic left breast pain 20 years ago with nipple discharge in the past. She's had no recent nipple discharge recently.                               ADDITIONAL INFORMATION: PROGNOSTIC INDICATORS Results: IMMUNOHISTOCHEMICAL AND MORPHOMETRIC ANALYSIS PERFORMED MANUALLY Estrogen Receptor: 80%, POSITIVE, STRONG STAINING INTENSITY Progesterone Receptor: 20%, POSITIVE, STRONG STAINING INTENSITY Proliferation Marker Ki67: 60% REFERENCE RANGE ESTROGEN RECEPTOR NEGATIVE 0% POSITIVE =>1% REFERENCE RANGE PROGESTERONE RECEPTOR NEGATIVE 0% POSITIVE =>1% All controls stained appropriately Enid Cutter MD Pathologist, Electronic Signature ( Signed 06/28/2016) FINAL DIAGNOSIS Diagnosis Breast, left, needle core biopsy, 3:00 o'clock 6 cmfn - INVASIVE DUCTAL CARCINOMA. - DUCTAL CARCINOMA IN SITU. - SEE COMMENT. 1 of 2 FINAL for Ipock, Christon D [phone]) Microscopic Comment Although definitive grading of breast carcinoma is best done on excision, the features of the invasive  tumor from the left 3 o'clock breast biopsy 6 cm from the nipple are compatible with a grade 3 breast carcinoma. Breast prognostic markers will be performed and reported in an addendum. Findings are called to the [place] on 06/27/2016. Dr. Lyndon Code has seen this case in consultation with agreement. (RH:ecj 06/27/2016) Willeen Niece MD Pathologist, Electronic Signature (Case signed 06/27/2016) Specimen Gross and Clinical Information Specimen Comment In formalin 4:30 pm extracted less than 1 min; suspicious mass 3:00 left breast 1.8 cm Specimen(s) Obtained: Breast, left, needle core biopsy, 3:00 o'clock 6 cmfn Specimen Clinical Information Invasive mammary carcinoma Gross Received labeled "Celia,Tieisha" and "Left breast 3:00" (TIF 1630 CIT <78mn) are 3 cores of yellow red to gray white soft to firm tissue, ranging from 0.6 x 0.15 x 0.1 cm to 1.5 x 0.15 x 0.1 cm. One block submitted. (SSW 2/6) Stain(s) used in Diagnosis: The following stain(s) were used in diagnosing           CLINICAL DATA: 56year old presenting with a palpable lump in the outer left breast at posterior depth. Annual evaluation, right breast. Patient also complains of diffuse left breast pain.  EXAM: 2D DIGITAL DIAGNOSTIC BILATERAL MAMMOGRAM WITH CAD AND ADJUNCT TOMO  ULTRASOUND LEFT BREAST  COMPARISON: Mammography 03/21/2015, 12/16/2013 and earlier. No prior ultrasound.  ACR Breast Density Category d: The breast tissue is extremely dense, which lowers the sensitivity of mammography.  FINDINGS: Standard 2D and tomosynthesis full field CC and MLO views of both breasts were obtained. A standard and tomosynthesis spot tangential view of the area of concern in the left breast was also obtained.  Corresponding to the area of palpable concern is a new focal  asymmetry or mass in the outer left breast at posterior depth. No suspicious findings elsewhere in the left breast.  No findings  suspicious for malignancy in the right breast.  Mammographic images were processed with CAD.  On physical exam, there is a palpable approximate 1-2 cm lump in the outer left breast corresponding to what the patient is feeling.  Targeted left breast ultrasound is performed, showing a hypoechoic mass with irregular margins demonstrating internal power Doppler flow and slight acoustic enhancement at the 3 o'clock position approximately 6 cm from the nipple measuring approximately 0.9 x 1.2 x 1.8 cm, corresponding to the palpable concern and the mammographic abnormality.  Sonographic evaluation of the left axilla demonstrates no pathologic lymphadenopathy.  IMPRESSION: 1. Suspicious approximate 1.8 cm mass involving the outer left breast at the 3 o'clock position approximately 6 cm from the nipple corresponding to the palpable concern. 2. No pathologic left axillary lymphadenopathy. 3. No mammographic evidence of malignancy, right breast.  RECOMMENDATION: Ultrasound-guided core needle biopsy of the suspicious left breast mass.  The ultrasound biopsy procedure was discussed with the patient and her questions were answered. She has agreed to proceed and this has been scheduled for Tuesday, February 6.  I have discussed the findings and recommendations with the patient. Results were also provided in writing at the conclusion of the visit.  BI-RADS CATEGORY 5: Highly suggestive of malignancy.   Electronically Signed By: Evangeline Dakin M.D. On: 06/22/2016 14:44           ADDITIONAL INFORMATION: PROGNOSTIC INDICATORS Results: IMMUNOHISTOCHEMICAL AND MORPHOMETRIC ANALYSIS PERFORMED MANUALLY Estrogen Receptor: 80%, POSITIVE, STRONG STAINING INTENSITY Progesterone Receptor: 20%, POSITIVE, STRONG STAINING INTENSITY Proliferation Marker Ki67: 60% REFERENCE RANGE ESTROGEN RECEPTOR NEGATIVE 0% POSITIVE =>1% REFERENCE RANGE PROGESTERONE RECEPTOR NEGATIVE 0% POSITIVE  =>1% All controls stained appropriately Enid Cutter MD Pathologist, Electronic Signature ( Signed 06/28/2016) FINAL DIAGNOSIS Diagnosis Breast, left, needle core biopsy, 3:00 o'clock 6 cmfn - INVASIVE DUCTAL CARCINOMA. - DUCTAL CARCINOMA IN SITU. - SEE COMMENT. 1 of 2 FINAL for Porco, Tyesha D [phone]) Microscopic Comment Although definitive grading of breast carcinoma is best done on excision, the features of the invasive tumor from the left 3 o'clock breast biopsy 6 cm from the nipple are compatible with a grade 3 breast carcinoma. Breast prognostic markers will be performed and reported in an addendum. Findings are called to the [place] on 06/27/2016. Dr. Lyndon Code has seen this case in consultation with agreement. (RH:ecj 06/27/2016) Willeen Niece MD Pathologist, Electronic Signature (Case signed 06/27/2016) Specimen Gross and Clinical Information Specimen Comment In formalin 4:30 pm extracted less than 1 min; suspicious mass 3:00 left breast 1.8 cm Specimen(s) Obtained: Breast, left, needle core biopsy, 3:00 o'clock 6 cmfn Specimen Clinical Information Invasive mammary carcinoma Gross Received labeled "Morren,Kellianne" and "Left breast 3:00" (TIF 1630 CIT <62mn) are 3 cores of yellow red to gray white soft to firm tissue, ranging from 0.6 x 0.15 x 0.1 cm to 1.5 x 0.15 x 0.1 cm. One block submitted. (SSW 2/6) Stain(s) used in Diagnosis: The following stain(s) were used in diagnosing the case: Her2 FISH, ER-ACIS, PR-ACIS, KI-67-ACIS. The control(s) stained appropriately. Disclaimer PR progesterone receptor (16), immunohistochemical stains are performed on formalin fixed, paraffin embedded tissue using a 3,3"-diaminobenzidine (DAB) chromogen and Leica Bond Autostainer System. The staining intensity of the nucleus is scored manually and is reported as the percentage of tumor cell nuclei demonstrating specific nuclear staining. Estrogen receptor (6F11),  immunohistochemical stains are performed on formalin fixed, paraffin  embedded tissue using a 3,3"-diaminobenzidine (DAB) chromogen and Leica Bond Autostainer System. The staining intensity of the nucleus is scored manually and is reported as the percentage of tumor cell nuclei demonstrating specific nuclear staining. HER2 IQFISH pharmDX (code [id number]) is a direct fluorescence in-situ hybridization assay designed to quantitatively determine HER2 gene amplification in formalin-fixed, paraffin-embedded tissue specimens. It is performed at [facility] and is reported using ASCO/CAP scoring criteria published in 2013. Ki-67 (MM1), immunohistochemical stains are performed on formalin fixed, paraffin embedded tissue using a 3,3"-diaminobenzidine (DAB) chromogen and Leica Bond Autostainer System. The staining intensity of the nucleus is scored manually and is reported as the percentage of tumor cell nuclei demonstrating specific nuclear staining.  The patient is a 56 year old female.   Allergies (Armen Eulas Post, CMA; 07/02/2016 9:24 AM) OxyCODONE HCl *ANALGESICS - OPIOID* Propoxyphene Compound *ANALGESICS - OPIOID* Adhesive Tape Aspir-81 *ANALGESICS - NonNarcotic* PredniSONE *CORTICOSTEROIDS*  Medication History (Armen Glenn, CMA; 07/02/2016 9:25 AM) Lisinopril (20MG Tablet, Oral) Active. Oxybutynin Chloride (5MG Tablet, Oral) Active. Multivitamins/Minerals (Oral) Active.    Vitals (Armen Glenn CMA; 07/02/2016 9:24 AM) 07/02/2016 9:23 AM Weight: 199.13 lb Height: 61in Body Surface Area: 1.89 m Body Mass Index: 37.62 kg/m  Temp.: 33F  Pulse: 108 (Regular)  P.OX: 96% (Room air) BP: 138/74 (Sitting, Left Arm, Standard)      Physical Exam (Dock Baccam A. Yachet Mattson MD; 07/02/2016 10:27 AM)  General Mental Status-Alert. General Appearance-Consistent with stated age. Hydration-Well hydrated. Voice-Normal.  Head and Neck Head-normocephalic, atraumatic with no  lesions or palpable masses. Trachea-midline. Thyroid Gland Characteristics - normal size and consistency.  Chest and Lung Exam Chest and lung exam reveals -quiet, even and easy respiratory effort with no use of accessory muscles and on auscultation, normal breath sounds, no adventitious sounds and normal vocal resonance. Inspection Chest Wall - Normal. Back - normal.  Breast Note: Left breast tender upper quadrant were Steri-Stripped from biopsy noted. Roughly a 1 cm mobile mass. Right breast is normal.  Cardiovascular Cardiovascular examination reveals -normal heart sounds, regular rate and rhythm with no murmurs and normal pedal pulses bilaterally.  Neurologic Neurologic evaluation reveals -alert and oriented x 3 with no impairment of recent or remote memory. Mental Status-Normal.  Musculoskeletal Note: Walks with a cane.  Lymphatic Head & Neck  General Head & Neck Lymphatics: Bilateral - Description - Normal. Axillary  General Axillary Region: Bilateral - Description - Normal. Tenderness - Non Tender.    Assessment & Plan (Boone Gear A. Ferlando Lia MD; 07/02/2016 10:41 AM)  BREAST CANCER, LEFT (C50.912) Impression: Refer to medical radiation oncology. Discussed breast conservation versus mastectomy with the patient. Risks, benefits and alternatives to each surgery and/or treatments were discussed at great length. Long-term expectations and potential other treatments and/or surgeries discussed. The patient opted for left breast needle localized partial mastectomy and left axillary sentinel lymph node mapping. Risk of lumpectomy include bleeding, infection, seroma, more surgery, use of seed/wire, wound care, cosmetic deformity and the need for other treatments, death , blood clots, death. Pt agrees to proceed. Risk of sentinel lymph node mapping include bleeding, infection, lymphedema, shoulder pain. stiffness, dye allergy. cosmetic deformity , blood clots, death, need for  more surgery. Pt agres to proceed.  Current Plans You are being scheduled for surgery- Our schedulers will call you.  You should hear from our office's scheduling department within 5 working days about the location, date, and time of surgery. We try to make accommodations for patient's preferences in scheduling surgery, but sometimes the OR schedule  or the surgeon's schedule prevents [name] from making those accommodations.  If you have not heard from our office [phone]) in 5 working days, call the office and ask for your surgeon's nurse.  If you have other questions about your diagnosis, plan, or surgery, call the office and ask for your surgeon's nurse.  Pt Education - [facility] Breast Cancer Information Given - [facility] "Breast Journey" Package We discussed the staging and pathophysiology of breast cancer. We discussed all of the different options for treatment for breast cancer including surgery, chemotherapy, radiation therapy, Herceptin, and antiestrogen therapy. We discussed a sentinel lymph node biopsy as she does not appear to having lymph node involvement right now. We discussed the performance of that with injection of radioactive tracer and blue dye. We discussed that she would have an incision underneath her axillary hairline. We discussed that there is a bout a 10-20% chance of having a positive node with a sentinel lymph node biopsy and we will await the permanent pathology to make any other first further decisions in terms of her treatment. One of these options might be to return to the operating room to perform an axillary lymph node dissection. We discussed about a 1-2% risk lifetime of chronic shoulder pain as well as lymphedema associated with a sentinel lymph node biopsy. We discussed the options for treatment of the breast cancer which included lumpectomy versus a mastectomy. We discussed the performance of the lumpectomy with a wire placement. We discussed a 10-20% chance of a  positive margin requiring reexcision in the operating room. We also discussed that she may need radiation therapy or antiestrogen therapy or both if she undergoes lumpectomy. We discussed the mastectomy and the postoperative care for that as well. We discussed that there is no difference in her survival whether she undergoes lumpectomy with radiation therapy or antiestrogen therapy versus a mastectomy. There is a slight difference in the local recurrence rate being 3-5% with lumpectomy and about 1% with a mastectomy. We discussed the risks of operation including bleeding, infection, possible reoperation. She understands her further therapy will be based on what her stages at the time of her operation.  Pt Education - Pamphlet Given - Breast Biopsy: discussed with patient and provided information. Pt Education - ABC (After Breast Cancer) Class Info: discussed with patient and provided information.

## 2016-07-26 NOTE — Anesthesia Postprocedure Evaluation (Signed)
Anesthesia Post Note  Patient: [name]  Procedure(s) Performed: Procedure(s) (LRB): BREAST LUMPECTOMY WITH RADIOACTIVE SEED AND SENTINEL LYMPH NODE BIOPSY (Left)  Patient location during evaluation: PACU Anesthesia Type: General Level of consciousness: awake and alert and oriented Pain management: pain level controlled Vital Signs Assessment: post-procedure vital signs reviewed and stable Respiratory status: spontaneous breathing, nonlabored ventilation, respiratory function stable and patient connected to nasal cannula oxygen Cardiovascular status: blood pressure returned to baseline and stable Postop Assessment: no signs of nausea or vomiting Comments: Patient developed respiratory depression after transfer to phase II. Taken back to phase I, respiration assisted with ambu and 100% O2. Patient given Narcan 0.1mg  with return of normal respiratory rate. Will continue to monitor in Phase I for next 1 1/2 hours.       Last Vitals:  Vitals:   07/26/16 1445 07/26/16 1500  BP: (!) 146/93 (!) 145/93  Pulse: 69 64  Resp: 12 (!) 8  Temp:      Last Pain:  Vitals:   07/26/16 1500  TempSrc:   PainSc: Asleep                 Heriberto Stmartin A.

## 2016-07-26 NOTE — Discharge Instructions (Signed)
[facility] °Office Phone Number [phone] ° °BREAST BIOPSY/ PARTIAL MASTECTOMY: POST OP INSTRUCTIONS ° °Always review your discharge instruction sheet given to you by the facility where your surgery was performed. ° °IF YOU HAVE DISABILITY OR FAMILY LEAVE FORMS, YOU MUST BRING THEM TO THE OFFICE FOR PROCESSING.  DO NOT GIVE THEM TO YOUR DOCTOR. ° °1. A prescription for pain medication may be given to you upon discharge.  Take your pain medication as prescribed, if needed.  If narcotic pain medicine is not needed, then you may take acetaminophen (Tylenol) or ibuprofen (Advil) as needed. °2. Take your usually prescribed medications unless otherwise directed °3. If you need a refill on your pain medication, please contact your pharmacy.  They will contact our office to request authorization.  Prescriptions will not be filled after 5pm or on week-ends. °4. You should eat very light the first 24 hours after surgery, such as soup, crackers, pudding, etc.  Resume your normal diet the day after surgery. °5. Most patients will experience some swelling and bruising in the breast.  Ice packs and a good support bra will help.  Swelling and bruising can take several days to resolve.  °6. It is common to experience some constipation if taking pain medication after surgery.  Increasing fluid intake and taking a stool softener will usually help or prevent this problem from occurring.  A mild laxative (Milk of Magnesia or Miralax) should be taken according to package directions if there are no bowel movements after 48 hours. °7. Unless discharge instructions indicate otherwise, you may remove your bandages 24-48 hours after surgery, and you may shower at that time.  You may have steri-strips (small skin tapes) in place directly over the incision.  These strips should be left on the skin for 7-10 days.  If your surgeon used skin glue on the incision, you may shower in 24 hours.  The glue will flake off over the  next 2-3 weeks.  Any sutures or staples will be removed at the office during your follow-up visit. °8. ACTIVITIES:  You may resume regular daily activities (gradually increasing) beginning the next day.  Wearing a good support bra or sports bra minimizes pain and swelling.  You may have sexual intercourse when it is comfortable. °a. You may drive when you no longer are taking prescription pain medication, you can comfortably wear a seatbelt, and you can safely maneuver your car and apply brakes. °b. RETURN TO WORK:  ______________________________________________________________________________________ °9. You should see your doctor in the office for a follow-up appointment approximately two weeks after your surgery.  Your doctor’s nurse will typically make your follow-up appointment when she calls you with your pathology report.  Expect your pathology report 2-3 business days after your surgery.  You may call to check if you do not hear from us after three days. °10. OTHER INSTRUCTIONS: _______________________________________________________________________________________________ _____________________________________________________________________________________________________________________________________ °_____________________________________________________________________________________________________________________________________ °_____________________________________________________________________________________________________________________________________ ° °WHEN TO CALL YOUR DOCTOR: °1. Fever over 101.0 °2. Nausea and/or vomiting. °3. Extreme swelling or bruising. °4. Continued bleeding from incision. °5. Increased pain, redness, or drainage from the incision. ° °The clinic staff is available to answer your questions during regular business hours.  Please don’t hesitate to call and ask to speak to one of the nurses for clinical concerns.  If you have a medical emergency, go to the nearest  emergency room or call 911.  A surgeon from [facility] is always on call at the hospital. ° °For further questions, please visit [email or web address]  ° ° ° °  Post Anesthesia Home Care Instructions ° °Activity: °Get plenty of rest for the remainder of the day. A responsible adult should stay with you for 24 hours following the procedure.  °For the next 24 hours, DO NOT: °-Drive a car °-Operate machinery °-Drink alcoholic beverages °-Take any medication unless instructed by your physician °-Make any legal decisions or sign important papers. ° °Meals: °Start with liquid foods such as gelatin or soup. Progress to regular foods as tolerated. Avoid greasy, spicy, heavy foods. If nausea and/or vomiting occur, drink only clear liquids until the nausea and/or vomiting subsides. Call your physician if vomiting continues. ° °Special Instructions/Symptoms: °Your throat may feel dry or sore from the anesthesia or the breathing tube placed in your throat during surgery. If this causes discomfort, gargle with warm salt water. The discomfort should disappear within 24 hours. ° °If you had a scopolamine patch placed behind your ear for the management of post- operative nausea and/or vomiting: ° °1. The medication in the patch is effective for 72 hours, after which it should be removed.  Wrap patch in a tissue and discard in the trash. Wash hands thoroughly with soap and water. °2. You may remove the patch earlier than 72 hours if you experience unpleasant side effects which may include dry mouth, dizziness or visual disturbances. °3. Avoid touching the patch. Wash your hands with soap and water after contact with the patch. °  ° °

## 2016-07-26 NOTE — Transfer of Care (Signed)
Immediate Anesthesia Transfer of Care Note  Patient: [name]  Procedure(s) Performed: Procedure(s): BREAST LUMPECTOMY WITH RADIOACTIVE SEED AND SENTINEL LYMPH NODE BIOPSY (Left)  Patient Location: PACU  Anesthesia Type:General  Level of Consciousness: awake  Airway & Oxygen Therapy: Patient Spontanous Breathing and Patient connected to face mask oxygen  Post-op Assessment: Report given to RN and Post -op Vital signs reviewed and stable  Post vital signs: Reviewed and stable  Last Vitals:  Vitals:   07/26/16 1127 07/26/16 1128  BP:    Pulse: 80 84  Resp: 17 17  Temp:      Last Pain:  Vitals:   07/26/16 1041  TempSrc: Oral  PainSc: 4       Patients Stated Pain Goal: 2 (50/09/38 1829)  Complications: No apparent anesthesia complications

## 2016-07-26 NOTE — Progress Notes (Signed)
Emotional support during breast injections °

## 2016-07-26 NOTE — Interval H&P Note (Signed)
History and Physical Interval Note:  07/26/2016 11:30 AM  [name] Begin  has presented today for surgery, with the diagnosis of LEFT BREAST CANCER  The various methods of treatment have been discussed with the patient and family. After consideration of risks, benefits and other options for treatment, the patient has consented to  Procedure(s): BREAST LUMPECTOMY WITH RADIOACTIVE SEED AND SENTINEL LYMPH NODE BIOPSY (Left) as a surgical intervention .  The patient's history has been reviewed, patient examined, no change in status, stable for surgery.  I have reviewed the patient's chart and labs.  Questions were answered to the patient's satisfaction.      The [place] narcotic database has been quieried and no conflicts identified.    Shawndrea Rutkowski A.

## 2016-07-26 NOTE — Progress Notes (Signed)
Assisted Dr. Royce Macadamia with left, ultrasound guided, pectoralis block. Side rails up, monitors on throughout procedure. See vital signs in flow sheet. Tolerated Procedure well.

## 2016-07-26 NOTE — Addendum Note (Signed)
Addendum  created 07/26/16 1512 by Josephine Igo, MD   Sign clinical note

## 2016-07-26 NOTE — Anesthesia Preprocedure Evaluation (Signed)
Anesthesia Evaluation  Patient identified by MRN, date of birth, ID band Patient awake    History of Anesthesia Complications (+) history of anesthetic complications  Airway Mallampati: II  TM Distance: >3 FB Neck ROM: Full    Dental no notable dental hx. (+) Caps   Pulmonary asthma , sleep apnea and Continuous Positive Airway Pressure Ventilation , former smoker,    Pulmonary exam normal breath sounds clear to auscultation       Cardiovascular hypertension, Pt. on medications + Peripheral Vascular Disease  Normal cardiovascular exam Rhythm:Regular Rate:Normal     Neuro/Psych  Headaches,  Neuromuscular disease CVA, No Residual Symptoms    GI/Hepatic negative GI ROS, Neg liver ROS,   Endo/Other  Obesity Left Breast Ca  Renal/GU negative Renal ROS  negative genitourinary   Musculoskeletal  (+) Arthritis , Osteoarthritis,  Fibromyalgia -  Abdominal (+) + obese,   Peds  Hematology negative hematology ROS (+)   Anesthesia Other Findings   Reproductive/Obstetrics                             Anesthesia Physical Anesthesia Plan  ASA: III  Anesthesia Plan: General and Regional   Post-op Pain Management:  Regional for Post-op pain   Induction: Intravenous  Airway Management Planned: LMA  Additional Equipment:   Intra-op Plan:   Post-operative Plan: Extubation in OR  Informed Consent: I have reviewed the patients History and Physical, chart, labs and discussed the procedure including the risks, benefits and alternatives for the proposed anesthesia with the patient or authorized representative who has indicated his/her understanding and acceptance.   Dental advisory given  Plan Discussed with: Anesthesiologist, CRNA and Surgeon  Anesthesia Plan Comments:         Anesthesia Quick Evaluation

## 2016-07-26 NOTE — Anesthesia Postprocedure Evaluation (Signed)
Anesthesia Post Note  Patient: [name]  Procedure(s) Performed: Procedure(s) (LRB): BREAST LUMPECTOMY WITH RADIOACTIVE SEED AND SENTINEL LYMPH NODE BIOPSY (Left)  Patient location during evaluation: PACU Anesthesia Type: General Level of consciousness: awake and alert and oriented Pain management: pain level controlled Vital Signs Assessment: post-procedure vital signs reviewed and stable Respiratory status: spontaneous breathing, nonlabored ventilation and respiratory function stable Cardiovascular status: blood pressure returned to baseline and stable Postop Assessment: no signs of nausea or vomiting Anesthetic complications: no       Last Vitals:  Vitals:   07/26/16 1345 07/26/16 1400  BP: (!) 147/92 140/80  Pulse: 60 64  Resp: (!) 7 16  Temp:      Last Pain:  Vitals:   07/26/16 1400  TempSrc:   PainSc: 5                  Myrtha Tonkovich A.

## 2016-07-26 NOTE — Anesthesia Procedure Notes (Addendum)
Anesthesia Regional Block: Pectoralis block   Pre-Anesthetic Checklist: ,, timeout performed, Correct Patient, Correct Site, Correct Laterality, Correct Procedure, Correct Position, site marked, Risks and benefits discussed,  Surgical consent,  Pre-op evaluation,  At surgeon's request and post-op pain management  Laterality: Left  Prep: chloraprep       Needles:  Injection technique: Single-shot  Needle Type: Echogenic Stimulator Needle     Needle Length: 9cm  Needle Gauge: 21   Needle insertion depth: 6 cm   Additional Needles:   Procedures: ultrasound guided,,,,,,,,  Narrative:  Start time: 07/26/2016 11:07 AM End time: 07/26/2016 11:12 AM Injection made incrementally with aspirations every 5 mL.  Performed by: Personally  Anesthesiologist: Josephine Igo  Additional Notes: Timeout performed. Patient sedated. Relevant anatomy ID'd using [name]. Incremental 3-73ml injection with frequent aspiration. Patient tolerated procedure well.

## 2016-07-26 NOTE — Anesthesia Procedure Notes (Signed)
Procedure Name: LMA Insertion Date/Time: 07/26/2016 12:00 PM Performed by: Lieutenant Diego Pre-anesthesia Checklist: Patient identified, Emergency Drugs available, Suction available and Patient being monitored Patient Re-evaluated:Patient Re-evaluated prior to inductionOxygen Delivery Method: Circle system utilized Preoxygenation: Pre-oxygenation with 100% oxygen Intubation Type: IV induction Ventilation: Mask ventilation without difficulty LMA: LMA inserted LMA Size: 4.0 Number of attempts: 1 Airway Equipment and Method: Bite block Placement Confirmation: positive ETCO2 and breath sounds checked- equal and bilateral Tube secured with: Tape Dental Injury: Teeth and Oropharynx as per pre-operative assessment

## 2016-07-26 NOTE — Op Note (Signed)
Preoperative diagnosis: Stage I left breast cancer upper-outer quadrant   Postoperative diagnosis: Same   Procedure: Left breast seed localized lumpectomy with left axillary sentinel lymph node mapping deep axillary lymph node   Surgeon: Erroll Luna M.D.   Anesthesia: LMA with pectoral block anesthesia and local   EBL: 20 cc   Specimen: Left breast mass with clip and seed to pathology and two  axillary sentinel node hot    Drains: None    Indications for procedure: Patient presents for treatment of her left breast cancer. She has opted for breast conservation after lengthy discussion of treatment options to include breast conservation surgery and mastectomy and reconstruction. Risks, benefits and alternatives discussed with the patient.The procedure has been discussed with the patient. Alternatives to surgery have been discussed with the patient. Risks of surgery include bleeding, Infection, Seroma formation, death, and the need for further surgery. The patient understands and wishes to proceed.Sentinel lymph node mapping and dissection has been discussed with the patient. Risk of bleeding, Infection, Seroma formation, Additional procedures,, Shoulder weakness , Shoulder stiffness, Nerve and blood vessel injury and reaction to the mapping dyes have been discussed. Alternatives to surgery have been discussed with the patient. The patient agrees to proceed.     Description of procedure: Patient underwent placement of left breast need a radiology earlier in the week. She presents to the holding area and questions are answered. Neoprobe was used to verify clip placement in the left breast. Patient underwent technetium sulfur colloid injection per protocol. Questions answered. Patient taken back to operating room and placed supine on the operating room table. Patient received 2 g of Ancef. After induction of LMA anesthesia left breast was prepped and draped in a sterile fashion and 4 cc of  methylene blue dye were injected in a subareolar position. Of note, patient had pectoral block by anesthesia prior to this. Neoprobe was used to identify the radioactive seen in the left upper-outer quadrant.  Local anesthesia injected. Curvilinear incision made and dissection was carried around to excise all tissue around both the clip and seen. Radiograph showed the mass with gross negative margins. Both seed  and clip were in the specimen. Specimen sent to pathology.   Neoprobe was switched to the technetium sulfur colloid setting. Hot spot identify the left axilla. Access to axilla obtained through the breast incision. 2   Hot sentinel  lymph nodes identified and excised. Background counts approached 0. Wound was irrigated,  Made hemostatic,  Clipped  and closed with 3-0 Vicryl and 4-0 Monocryl. Dermabond applied. All final counts sponge, needle instruments found to be correct at this point. Patient awoke, extubate and  taken to recovery in satisfactory condition.

## 2016-07-29 ENCOUNTER — Encounter (HOSPITAL_BASED_OUTPATIENT_CLINIC_OR_DEPARTMENT_OTHER): Payer: Self-pay | Admitting: Surgery

## 2016-07-30 MED FILL — OXYBUTYNIN 5 MG TABLET: 5 | 90 days supply | Qty: 180 | Fill #0

## 2016-07-31 ENCOUNTER — Telehealth: Payer: Self-pay | Admitting: *Deleted

## 2016-07-31 NOTE — Telephone Encounter (Signed)
Scheduled and confirmed genetics appt on 09/12/16 at 0900. Relate doing well after sx and without complaints.

## 2016-08-01 NOTE — Addendum Note (Signed)
Addendum  created 08/01/16 1210 by Josephine Igo, MD   Anesthesia Intra Blocks edited, Sign clinical note

## 2016-08-02 ENCOUNTER — Encounter: Payer: Self-pay | Admitting: Hematology and Oncology

## 2016-08-02 ENCOUNTER — Ambulatory Visit (HOSPITAL_BASED_OUTPATIENT_CLINIC_OR_DEPARTMENT_OTHER): Payer: Medicare Other | Admitting: Hematology and Oncology

## 2016-08-02 ENCOUNTER — Telehealth: Payer: Self-pay | Admitting: *Deleted

## 2016-08-02 DIAGNOSIS — C50512 Malignant neoplasm of lower-outer quadrant of left female breast: Secondary | ICD-10-CM

## 2016-08-02 DIAGNOSIS — Z170 Estrogen receptor positive status [ER+]: Secondary | ICD-10-CM | POA: Diagnosis not present

## 2016-08-02 NOTE — Progress Notes (Signed)
Patient Care Team: [name], MD as PCP - General (Family Medicine)  DIAGNOSIS:  Encounter Diagnosis  Name Primary?  . Malignant neoplasm of lower-outer quadrant of left breast of female, estrogen receptor positive ([place])     SUMMARY OF ONCOLOGIC HISTORY:   Malignant neoplasm of lower-outer quadrant of left breast of female, estrogen receptor positive ([place])   06/26/2016 Initial Diagnosis    Left breast biopsy 3:00: IDC with DCIS, grade 3, ER 80%, PR 20%, Ki-67 60%, HER-2 negative ratio 1.18; palpable lump: 1.8 cm lesion, no lymph nodes, T1 cN0 stage I a clinical stage      07/26/2016 Surgery    Left lumpectomy: IDC 1.8 cm, with DCIS, margins negative, 0/2 lymph nodes, ER 80%, PR 20%, HER-2 negative ratio 1.18, Ki-67 60%, T1cN0 stage IA       CHIEF COMPLIANT: Follow-up after left lumpectomy  INTERVAL HISTORY: [name] is a 56 year old with above-mentioned history left breast cancer treated with lumpectomy and is here today to discuss the pathology report. She is healing and recovering from the recent surgery. She complains of numb feeling on the dorsum of her hands.  REVIEW OF SYSTEMS:   Constitutional: Denies fevers, chills or abnormal weight loss Eyes: Denies blurriness of vision Ears, nose, mouth, throat, and face: Denies mucositis or sore throat Respiratory: Denies cough, dyspnea or wheezes Cardiovascular: Denies palpitation, chest discomfort Gastrointestinal:  Denies nausea, heartburn or change in bowel habits Skin: Denies abnormal skin rashes Lymphatics: Denies new lymphadenopathy or easy bruising Neurological:Denies numbness, tingling or new weaknesses Behavioral/Psych: Mood is stable, no new changes  Extremities: No lower extremity edema Breast: Left lumpectomy All other systems were reviewed with the patient and are negative.  I have reviewed the past medical history, past surgical history, social history and family history with the patient and they are  unchanged from previous note.  ALLERGIES:  is allergic to oxycodone hcl; propoxyphene n-acetaminophen; tramadol; adhesive [tape]; aspirin; and prednisone.  MEDICATIONS:  Current Outpatient Prescriptions  Medication Sig Dispense Refill  . acetaminophen (TYLENOL) 500 MG tablet Take 1,000 mg by mouth every 6 (six) hours as needed (pain).    Marland Kitchen albuterol (PROAIR HFA) 108 (90 Base) MCG/ACT inhaler Inhale 2 puffs into the lungs every 6 (six) hours as needed for wheezing or shortness of breath.    . Calcium Citrate-Vitamin D (CALCIUM CITRATE +D PO) Take 1 tablet by mouth daily. Calcium 600 mg    . celecoxib (CELEBREX) 100 MG capsule Take 1 capsule (100 mg total) by mouth 2 (two) times daily. 30 capsule 0  . cyclobenzaprine (FLEXERIL) 5 MG tablet Take 5 mg by mouth 3 (three) times daily as needed for muscle spasms.    Marland Kitchen gabapentin (NEURONTIN) 300 MG capsule Take 300 mg by mouth 3 (three) times daily.    Marland Kitchen lisinopril (PRINIVIL,ZESTRIL) 20 MG tablet Take 20 mg by mouth daily.    . mometasone (ELOCON) 0.1 % cream Apply 1 application topically daily as needed (rash - summer eczema). Apply to arms    . Multiple Vitamin (MULTIVITAMIN WITH MINERALS) TABS tablet Take 1 tablet by mouth at bedtime.    . Multiple Vitamins-Iron (MULTI-VITAMIN/IRON PO) Take 1 tablet by mouth daily.    Marland Kitchen oxybutynin (DITROPAN) 5 MG tablet Take 5 mg by mouth 2 (two) times daily.    . silver sulfADIAZINE (SILVADENE) 1 % cream Apply 1 application topically 2 (two) times daily as needed (stomach tears).     No current facility-administered medications for this visit.  PHYSICAL EXAMINATION: ECOG PERFORMANCE STATUS: 1 - Symptomatic but completely ambulatory  Vitals:   08/02/16 1219  BP: 108/66  Pulse: 63  Resp: 18  Temp: 97.7 F (36.5 C)   Filed Weights   08/02/16 1219  Weight: 200 lb 14.4 oz (91.1 kg)    GENERAL:alert, no distress and comfortable SKIN: skin color, texture, turgor are normal, no rashes or significant  lesions EYES: normal, Conjunctiva are pink and non-injected, sclera clear OROPHARYNX:no exudate, no erythema and lips, buccal mucosa, and tongue normal  NECK: supple, thyroid normal size, non-tender, without nodularity LYMPH:  no palpable lymphadenopathy in the cervical, axillary or inguinal LUNGS: clear to auscultation and percussion with normal breathing effort HEART: regular rate & rhythm and no murmurs and no lower extremity edema ABDOMEN:abdomen soft, non-tender and normal bowel sounds MUSCULOSKELETAL:no cyanosis of digits and no clubbing  NEURO: alert & oriented x 3 with fluent speech, no focal motor/sensory deficits EXTREMITIES: No lower extremity edema  LABORATORY DATA:  I have reviewed the data as listed   Chemistry      Component Value Date/Time   NA 139 07/26/2016 1030   K 3.2 (L) 07/26/2016 1030   CL 106 07/26/2016 1030   CO2 22 07/26/2016 1030   BUN 16 07/26/2016 1030   CREATININE 0.64 07/26/2016 1030      Component Value Date/Time   CALCIUM 9.0 07/26/2016 1030   ALKPHOS 51 07/26/2016 1030   AST 23 07/26/2016 1030   ALT 14 07/26/2016 1030   BILITOT 0.9 07/26/2016 1030       Lab Results  Component Value Date   WBC 5.4 07/26/2016   HGB 12.5 07/26/2016   HCT 38.4 07/26/2016   MCV 96.5 07/26/2016   PLT 192 07/26/2016   NEUTROABS 3.4 07/26/2016    ASSESSMENT & PLAN:  Malignant neoplasm of lower-outer quadrant of left breast of female, estrogen receptor positive ([place]) 07/26/2016: Left lumpectomy: IDC 1.8 cm, with DCIS, margins negative, 0/2 lymph nodes, ER 80%, PR 20%, HER-2 negative ratio 1.18, Ki-67 60%, T1cN0 stage IA  Pathology counseling: I discussed the final pathology report of the patient provided  a copy of this report. I discussed the margins as well as lymph node surgeries. We also discussed the final staging along with previously performed ER/PR and HER-2/neu testing.  Recommendation: 1. Oncotype DX testing to determine if chemotherapy would be of  any benefit followed by 2. Adjuvant radiation therapy followed by 3. Adjuvant antiestrogen therapy  Patient is not a great candidate for chemotherapy because of prior history of stroke. However if she were to be high risk we might consider doing chemotherapy on her. Patient also does not want to undergo chemotherapy unless it is absolutely essential.  Return to clinic based upon Oncotype DX testing   I spent 25 minutes talking to the patient of which more than half was spent in counseling and coordination of care.  No orders of the defined types were placed in this encounter.  The patient has a good understanding of the overall plan. she agrees with it. she will call with any problems that may develop before the next visit here.   Rulon Eisenmenger, MD 08/02/16

## 2016-08-02 NOTE — Telephone Encounter (Signed)
Received order for oncotype testing. Requisition sent to pathology. Received by Varney Biles. PAC sent to [facility]

## 2016-08-02 NOTE — Assessment & Plan Note (Signed)
07/26/2016: Left lumpectomy: IDC 1.8 cm, with DCIS, margins negative, 0/2 lymph nodes, ER 80%, PR 20%, HER-2 negative ratio 1.18, Ki-67 60%, T1cN0 stage IA  Pathology counseling: I discussed the final pathology report of the patient provided  a copy of this report. I discussed the margins as well as lymph node surgeries. We also discussed the final staging along with previously performed ER/PR and HER-2/neu testing.  Recommendation: 1. Oncotype DX testing to determine if chemotherapy would be of any benefit followed by 2. Adjuvant radiation therapy followed by 3. Adjuvant antiestrogen therapy  Patient is not a great candidate for chemotherapy because of prior history of stroke. However if she were to be high risk we might consider doing chemotherapy on her. Patient also does not want to undergo chemotherapy unless it is absolutely essential.  Return to clinic based upon Oncotype DX testing

## 2016-08-15 DIAGNOSIS — M47816 Spondylosis without myelopathy or radiculopathy, lumbar region: Secondary | ICD-10-CM | POA: Diagnosis not present

## 2016-08-17 DIAGNOSIS — C50512 Malignant neoplasm of lower-outer quadrant of left female breast: Secondary | ICD-10-CM | POA: Diagnosis not present

## 2016-08-20 ENCOUNTER — Telehealth: Payer: Self-pay | Admitting: *Deleted

## 2016-08-20 ENCOUNTER — Encounter: Payer: Self-pay | Admitting: Genetics

## 2016-08-20 NOTE — Telephone Encounter (Signed)
Received Oncotype score of 51. Physician team notified.

## 2016-08-20 NOTE — Telephone Encounter (Signed)
Pt called to r/s genetics appt d/t insurance. New appt scheduled for 4/3 at 0900. Denies further needs at this time.

## 2016-08-21 ENCOUNTER — Encounter: Payer: Self-pay | Admitting: Genetics

## 2016-08-21 ENCOUNTER — Other Ambulatory Visit: Payer: Medicare Other

## 2016-08-21 ENCOUNTER — Ambulatory Visit (HOSPITAL_BASED_OUTPATIENT_CLINIC_OR_DEPARTMENT_OTHER): Payer: Medicare Other | Admitting: Genetics

## 2016-08-21 DIAGNOSIS — Z803 Family history of malignant neoplasm of breast: Secondary | ICD-10-CM

## 2016-08-21 DIAGNOSIS — Z8042 Family history of malignant neoplasm of prostate: Secondary | ICD-10-CM | POA: Diagnosis not present

## 2016-08-21 DIAGNOSIS — C50512 Malignant neoplasm of lower-outer quadrant of left female breast: Secondary | ICD-10-CM

## 2016-08-21 DIAGNOSIS — Z7183 Encounter for nonprocreative genetic counseling: Secondary | ICD-10-CM | POA: Diagnosis not present

## 2016-08-21 DIAGNOSIS — Z8041 Family history of malignant neoplasm of ovary: Secondary | ICD-10-CM | POA: Diagnosis not present

## 2016-08-21 NOTE — Progress Notes (Signed)
REFERRING PROVIDER: Nicholas Lose, MD [place], [place] [address]  PRIMARY PROVIDER:  [name], MD  PRIMARY REASON FOR VISIT:  1. Family history of breast cancer   2. Family history of ovarian cancer   3. Family history of prostate cancer     HISTORY OF PRESENT ILLNESS:   [name], a 56 y.o. female, was seen for a [facility] cancer genetics consultation at the request of Dr. Lindi Adie due to a personal and family history of cancer.  [name] presents to clinic today to discuss the possibility of a hereditary predisposition to cancer, genetic testing, and to further clarify her future cancer risks, as well as potential cancer risks for family members. [name] was accompanied to her appointment by her sister, [name].  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 This was treated with lumpectomy and she plans radiation and hormone therapy. Oncotype is pending.  CANCER HISTORY:    Malignant neoplasm of lower-outer quadrant of left breast of female, estrogen receptor positive ([place])   06/26/2016 Initial Diagnosis    Left breast biopsy 3:00: IDC with DCIS, grade 3, ER 80%, PR 20%, Ki-67 60%, HER-2 negative ratio 1.18; palpable lump: 1.8 cm lesion, no lymph nodes, T1 cN0 stage I a clinical stage      07/26/2016 Surgery    Left lumpectomy: IDC 1.8 cm, with DCIS, margins negative, 0/2 lymph nodes, ER 80%, PR 20%, HER-2 negative ratio 1.18, Ki-67 60%, T1cN0 stage IA        HORMONAL RISK FACTORS:  Menarche was at age 45.  First live birth at age nulliparous.  OCP use for approximately 1 years.  Ovaries intact: no.  Hysterectomy: yes.  Menopausal status: postmenopausal.  HRT use: 0 years. Colonoscopy: yes; normal. Mammogram within the last year: yes. Number of breast biopsies: 3. Up to date with pelvic exams:  Reports that she still has her cervix and last pap test was more than  5-6 years ago. Advised to consult her PCP or gynecologist regarding need for pap test. Any excessive radiation exposure in the past:  no  Past Medical History:  Diagnosis Date  . Arthritis    osteoarthritis  . Asthma    states no asthma attack since 2002  . Breast cancer ([place]) 06/26/16 bx   left breast  . Chronic back pain   . Complication of anesthesia    states takes more than normal to put her to sleep  . Dental bridge present    upper  . Dental crowns present   . DVT of upper extremity (deep vein thrombosis) ([place])   . Fibromyalgia   . Headache(784.0)    migraines  . History of blood transfusion 06/2005  . History of gallstones   . History of pneumonia   . History of shingles 07/2011  . HTN (hypertension)   . Normal coronary arteries 2003  . PFO (patent foramen ovale)   . Presence of inferior vena cava filter   . Pulmonary embolism ([place])   . Sleep apnea    used CPAP until after bypass surg.  . Status post gastric bypass for obesity   . Stroke [facility]) 12/2002   right-sided weakness  . Urinary incontinence     Past Surgical History:  Procedure Laterality Date  . ABDOMINAL HYSTERECTOMY  11/1997   complete  . ANTERIOR CERVICAL DECOMP/DISCECTOMY FUSION  02/05/2005   C5-6  . APPENDECTOMY  10/22/2008   laparoscopic  . BREAST LUMPECTOMY WITH RADIOACTIVE SEED AND SENTINEL LYMPH NODE BIOPSY Left 07/26/2016   Procedure: BREAST LUMPECTOMY WITH RADIOACTIVE SEED AND SENTINEL LYMPH NODE BIOPSY;  Surgeon: Erroll Luna, MD;  Location: [facility];  Service: General;  Laterality: Left;  . BUNIONECTOMY  05/1980   both feet  . BUNIONECTOMY  08/2011   left foot  . CARDIAC CATHETERIZATION  03/04/2002  . CARPAL TUNNEL RELEASE  06/21/2009   right  . CARPAL TUNNEL RELEASE     left hand  . CARPAL TUNNEL RELEASE  10/03/2011   Procedure: CARPAL TUNNEL RELEASE;  Surgeon: Wynonia Sours, MD;  Location: [facility];  Service: Orthopedics;  Laterality: Right;  CARPAL TUNNEL  WITH HYPOTHENAR FAT PAD TRANSFER  . CERVICAL SPINE SURGERY  01/2005   titanium plate implanted  . CHOLECYSTECTOMY  1990  . ELBOW SURGERY  08/09/2004   decompression ulnar nerve right elbow  . ENTEROLYSIS  10/22/2008   laparoscopic abd. enterolysis  . GASTRIC ROUX-EN-Y  2009  . HEEL SPUR SURGERY  08/1997   left  . HEMORRHOID SURGERY  03/1993  . LAPAROSCOPIC LYSIS INTESTINAL ADHESIONS  02/14/2000  . NAILBED REPAIR  01/10/2005; 08/2011   exc. matrix bilat. great toe  . OTHER SURGICAL HISTORY  12/1986   pt states that she had surgery to unclog her fallopean tubes  . SHOULDER SURGERY     bilat. - (left:  06/2005)  . TONSILLECTOMY  07/1995  . TRIGGER FINGER RELEASE  04/25/2006   decompression A-1 pulley left thumb  . UTERINE FIBROID SURGERY  12/95, 7/96   x2  . VENA CAVA FILTER PLACEMENT  2009   during Roux-en-Y surg.    Social History   Social History  . Marital status: Single    Spouse name: N/A  . Number of children: 0  . Years of education: N/A   Occupational History  . disabled    Social History Main Topics  . Smoking status: Former Research scientist (life sciences)  . Smokeless tobacco: Never Used     Comment: quit smoking 08/1989  . Alcohol use No  . Drug use: No  . Sexual activity: No   Other Topics Concern  . Not on file   Social History Narrative  . No narrative on file     FAMILY HISTORY:  We obtained a detailed, 4-generation family history.  Significant diagnoses are listed below: Family History  Problem Relation Age of Onset  . Breast cancer Paternal Aunt 81  . Cervical cancer Paternal Grandmother 1    d.40s  . Ovarian cancer Maternal Grandmother 23    d.23  . Colon polyps Father   . Diabetes Father     borderline  . Prostate cancer Father   . Hypertension Mother   . Hypertension    . Breast cancer Sister 20    treated with neoadjuvant chemo/radiation and lumpectomy   [name] has no children. She has 2 full-sisters, one full-brother, and one paternal half-brother. One of her  full-sisters, [name], is currently 74 and was daignosed with breast cancer at age 11. She received neoadjuvant chemo/radiation and underwent lumpectomy. With her permission, we accessed [name]'s genetic testing records and found that she tested negative for mutations in 21 genes analyzed by [facility]'s Breast/Gyn panel on 07/21/2014. The following genes were tested: ATM, BARD1, BRCA1, BRCA2, BRIP1, CDH1, CHEK2, EPCAM, FANCC, MLH1, MSH2, MSH6, NBN, PALB2, PMS2, PTEN, RAD51C, RAD51D, STK11, TP53, and XRCC2. Ms. Brocks other full-sister is 64  without cancers. Her full-brother died at 49 from AIDS. Her paternal half-brother is 41 without cancers.  Ms. Weikel mother is 46 without cancers. Her mother has a paternal half-sister who is 51 without cancers and a maternal half-brother who died at 85 with possible cancer. Ms. Staub maternal grandmother died at age 86 of reported ovarian cancer. However, Ms. Vath noted that her maternal and paternal grandmothers both died of gynecologic cancers. One had ovarian and one had cervical and she sometimes can't remember which had which.  Ms. Swiney father is 36 and had prostate cancer more than 10 years ago. He had 6 sisters and 4 brothers. One sister is currently 44 and had breast cancer in her 48s. One brother died at an unknown age with jaw cancer. Ms. Perdew paternal grandmother died in her 53s with reported cervical cancer, though, as noted above, could have been ovarian cancer. A female paternal first-cousin has an unspecified form of cancer.  Ms. Kobler sister, Addalyne Vandehei, was previously tested for hereditary breast and gynecologic cancer syndromes through [facility] and was negative for mutations in March 2016. Ms. Levitan did not know of any other previous family history of genetic testing for hereditary cancer risks. Patient's maternal ancestors are of [place] and [place] descent, and paternal ancestors are of African American/black descent. There is  no reported Ashkenazi Jewish ancestry. There is no known consanguinity.  GENETIC COUNSELING ASSESSMENT: PAHOLA DIMMITT is a 56 y.o. female with a personal and family history which is suggestive of a hereditary cancer syndrome and predisposition to cancer. We, therefore, discussed and recommended the following at today's visit.   DISCUSSION: We reviewed the characteristics, features and inheritance patterns of hereditary cancer syndromes. We also discussed genetic testing, including the appropriate family members to test, the process of testing, insurance coverage and turn-around-time for results. We discussed the implications of a negative, positive and/or variant of uncertain significant result. We recommended Ms. Raker pursue genetic testing for the 46-gene Common Hereditary Cancers Panel offered by [facility].   Based on Ms. Guile's personal and family history of cancer, she meets medical criteria for genetic testing. Despite that she meets criteria, she may still have an out of pocket cost. We discussed that if her out of pocket cost for testing is over $100, the laboratory will call and confirm whether she wants to proceed with testing.  If the out of pocket cost of testing is less than $100 she will be billed by the genetic testing laboratory.   PLAN: After considering the risks, benefits, and limitations, Ms. Ferrall  provided informed consent to pursue genetic testing and the blood sample was sent to [facility] for analysis of the 46-gene Common Hereditary Cancer Panel. Results should be available within approximately 3 weeks' time, at which point they will be disclosed by telephone to Ms. Mogan, as will any additional recommendations warranted by these results. Ms. Brenner will receive a summary of her genetic counseling visit and a copy of her results once available. This information will also be available in Epic.   Lastly, we encouraged Ms. Hegel to remain in contact with cancer genetics  annually so that we can continuously update the family history and inform her of any changes in cancer genetics and testing that may be of benefit for this family.   Ms.  Magner questions were answered to her satisfaction today. Our contact information was provided should additional questions or concerns arise. Thank you for the referral and allowing [name] to share  in the care of your patient.   Mal Misty, MS, [facility] Certified Naval [email or web address] phone: [phone]  The patient was seen for a total of 60 minutes in face-to-face genetic counseling.  This patient was discussed with Drs. Magrinat, Lindi Adie and/or Burr Medico who agrees with the above.    _______________________________________________________________________ For Office Staff:  Number of people involved in session: 2 Was an Intern/ student involved with case: no

## 2016-08-27 ENCOUNTER — Telehealth: Payer: Self-pay | Admitting: *Deleted

## 2016-08-27 NOTE — Telephone Encounter (Signed)
Called pt with oncotype score of 51. Scheduled and confirmed appt with Dr. Lindi Adie on 08/31/16 at 1:30.

## 2016-08-28 ENCOUNTER — Encounter (HOSPITAL_COMMUNITY): Payer: Self-pay

## 2016-08-29 DIAGNOSIS — M47816 Spondylosis without myelopathy or radiculopathy, lumbar region: Secondary | ICD-10-CM | POA: Diagnosis not present

## 2016-08-31 ENCOUNTER — Ambulatory Visit (HOSPITAL_BASED_OUTPATIENT_CLINIC_OR_DEPARTMENT_OTHER): Payer: Medicare Other | Admitting: Hematology and Oncology

## 2016-08-31 DIAGNOSIS — Z170 Estrogen receptor positive status [ER+]: Secondary | ICD-10-CM

## 2016-08-31 DIAGNOSIS — C50512 Malignant neoplasm of lower-outer quadrant of left female breast: Secondary | ICD-10-CM

## 2016-08-31 MED ORDER — LETROZOLE 2.5 MG PO TABS: 2.5000 mg | ORAL_TABLET | Freq: Every day | ORAL | 3 refills | Status: DC

## 2016-08-31 MED FILL — LETROZOLE 2.5 MG TABLET: 2.5 | 30 days supply | Qty: 30 | Fill #0 | Status: TO

## 2016-08-31 NOTE — Assessment & Plan Note (Signed)
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 Adjuvant chemotherapy with Taxotere and Cytoxan every 3 weeks 4 cycles 2. Adjuvant radiation therapy followed by 3. Adjuvant antiestrogen therapy  Chemotherapy Counseling: I discussed the risks and benefits of chemotherapy including the risks of nausea/ vomiting, risk of infection from low WBC count, fatigue due to chemo or anemia, bruising or bleeding due to low platelets, mouth sores, loss/ change in taste and decreased appetite. Liver and kidney function will be monitored through out chemotherapy as abnormalities in liver and kidney function may be a side effect of treatment. Risk of permanent bone marrow dysfunction and leukemia due to chemo were also discussed.  Patient is not a great candidate for chemotherapy because of prior history of stroke. However, given the high risk of recurrence, I still recommend systemic chemotherapy.  Patient will need a port, chemotherapy class and plan to start chemotherapy in 2 weeks.

## 2016-08-31 NOTE — Progress Notes (Signed)
Patient Care Team: [name], MD as PCP - General (Family Medicine)  DIAGNOSIS:  Encounter Diagnosis  Name Primary?  . Malignant neoplasm of lower-outer quadrant of left breast of female, estrogen receptor positive ([place])     SUMMARY OF ONCOLOGIC HISTORY:   Malignant neoplasm of lower-outer quadrant of left breast of female, estrogen receptor positive ([place])   06/26/2016 Initial Diagnosis    Left breast biopsy 3:00: IDC with DCIS, grade 3, ER 80%, PR 20%, Ki-67 60%, HER-2 negative ratio 1.18; palpable lump: 1.8 cm lesion, no lymph nodes, T1 cN0 stage I a clinical stage      07/26/2016 Surgery    Left lumpectomy: IDC 1.8 cm, with DCIS, margins negative, 0/2 lymph nodes, ER 80%, PR 20%, HER-2 negative ratio 1.18, Ki-67 60%, T1cN0 stage IA       08/21/2016 Oncotype testing    Oncotype DX recurrence score 51, risk of recurrence 34%       CHIEF COMPLIANT: Follow-up to discuss Oncotype DX test  INTERVAL HISTORY: [name] is a 56 year old with above-mentioned history left breast cancer treated with lumpectomy and had Oncotype DX testing. She is here today accompanied by her family to discuss the test results.   REVIEW OF SYSTEMS:   Constitutional: Denies fevers, chills or abnormal weight loss Eyes: Denies blurriness of vision Ears, nose, mouth, throat, and face: Denies mucositis or sore throat Respiratory: Denies cough, dyspnea or wheezes Cardiovascular: Denies palpitation, chest discomfort Gastrointestinal:  Denies nausea, heartburn or change in bowel habits Skin: Denies abnormal skin rashes Lymphatics: Denies new lymphadenopathy or easy bruising Neurological:Denies numbness, tingling or new weaknesses Behavioral/Psych: Mood is stable, no new changes  Extremities: No lower extremity edema  All other systems were reviewed with the patient and are negative.  I have reviewed the past medical history, past surgical history, social history and family history with the  patient and they are unchanged from previous note.  ALLERGIES:  is allergic to oxycodone hcl; propoxyphene n-acetaminophen; tramadol; adhesive [tape]; aspirin; and prednisone.  MEDICATIONS:  Current Outpatient Prescriptions  Medication Sig Dispense Refill  . acetaminophen (TYLENOL) 500 MG tablet Take 1,000 mg by mouth every 6 (six) hours as needed (pain).    Marland Kitchen albuterol (PROAIR HFA) 108 (90 Base) MCG/ACT inhaler Inhale 2 puffs into the lungs every 6 (six) hours as needed for wheezing or shortness of breath.    . Calcium Citrate-Vitamin D (CALCIUM CITRATE +D PO) Take 1 tablet by mouth daily. Calcium 600 mg    . celecoxib (CELEBREX) 100 MG capsule Take 1 capsule (100 mg total) by mouth 2 (two) times daily. 30 capsule 0  . cyclobenzaprine (FLEXERIL) 5 MG tablet Take 5 mg by mouth 3 (three) times daily as needed for muscle spasms.    Marland Kitchen gabapentin (NEURONTIN) 300 MG capsule Take 300 mg by mouth 3 (three) times daily.    Marland Kitchen letrozole (FEMARA) 2.5 MG tablet Take 1 tablet (2.5 mg total) by mouth daily. 90 tablet 3  . lisinopril (PRINIVIL,ZESTRIL) 20 MG tablet Take 20 mg by mouth daily.    . mometasone (ELOCON) 0.1 % cream Apply 1 application topically daily as needed (rash - summer eczema). Apply to arms    . Multiple Vitamin (MULTIVITAMIN WITH MINERALS) TABS tablet Take 1 tablet by mouth at bedtime.    . Multiple Vitamins-Iron (MULTI-VITAMIN/IRON PO) Take 1 tablet by mouth daily.    Marland Kitchen oxybutynin (DITROPAN) 5 MG tablet Take 5 mg by mouth 2 (two) times daily.    Marland Kitchen  silver sulfADIAZINE (SILVADENE) 1 % cream Apply 1 application topically 2 (two) times daily as needed (stomach tears).     No current facility-administered medications for this visit.     PHYSICAL EXAMINATION: ECOG PERFORMANCE STATUS: 1 - Symptomatic but completely ambulatory  Vitals:   08/31/16 1339  BP: (!) 132/53  Pulse: 74  Resp: 20  Temp: 98.3 F (36.8 C)   Filed Weights   08/31/16 1339  Weight: 203 lb 6.4 oz (92.3 kg)     GENERAL:alert, no distress and comfortable SKIN: skin color, texture, turgor are normal, no rashes or significant lesions EYES: normal, Conjunctiva are pink and non-injected, sclera clear OROPHARYNX:no exudate, no erythema and lips, buccal mucosa, and tongue normal  NECK: supple, thyroid normal size, non-tender, without nodularity LYMPH:  no palpable lymphadenopathy in the cervical, axillary or inguinal LUNGS: clear to auscultation and percussion with normal breathing effort HEART: regular rate & rhythm and no murmurs and no lower extremity edema ABDOMEN:abdomen soft, non-tender and normal bowel sounds MUSCULOSKELETAL:no cyanosis of digits and no clubbing  NEURO: alert & oriented x 3 with fluent speech, chronic leg weakness related to prior stroke EXTREMITIES: No lower extremity edema  LABORATORY DATA:  I have reviewed the data as listed   Chemistry      Component Value Date/Time   NA 139 07/26/2016 1030   K 3.2 (L) 07/26/2016 1030   CL 106 07/26/2016 1030   CO2 22 07/26/2016 1030   BUN 16 07/26/2016 1030   CREATININE 0.64 07/26/2016 1030      Component Value Date/Time   CALCIUM 9.0 07/26/2016 1030   ALKPHOS 51 07/26/2016 1030   AST 23 07/26/2016 1030   ALT 14 07/26/2016 1030   BILITOT 0.9 07/26/2016 1030       Lab Results  Component Value Date   WBC 5.4 07/26/2016   HGB 12.5 07/26/2016   HCT 38.4 07/26/2016   MCV 96.5 07/26/2016   PLT 192 07/26/2016   NEUTROABS 3.4 07/26/2016    ASSESSMENT & PLAN:  Malignant neoplasm of lower-outer quadrant of left breast of female, estrogen receptor positive ([place]) 07/26/2016: Left lumpectomy: IDC 1.8 cm, with DCIS, margins negative, 0/2 lymph nodes, ER 80%, PR 20%, HER-2 negative ratio 1.18, Ki-67 60%, T1cN0 stage IA  Oncotype DX score 51: 10 year risk of recurrence greater than 34%, extremely high risk  Recommendation: 1. Adjuvant chemotherapy with Adriamycin and Cytoxan 4 followed by Taxol weekly 12 2. Adjuvant  radiation therapy followed by 3. Adjuvant antiestrogen therapy  Chemotherapy Counseling: I discussed the risks and benefits of chemotherapy including the risks of nausea/ vomiting, risk of infection from low WBC count, fatigue due to chemo or anemia, bruising or bleeding due to low platelets, mouth sores, loss/ change in taste and decreased appetite. Liver and kidney function will be monitored through out chemotherapy as abnormalities in liver and kidney function may be a side effect of treatment. Risk of cardiac toxicity from Adriamycin was also discussed. Risk of permanent bone marrow dysfunction and leukemia due to chemo were also discussed.  After discussing this whole treatment plan entirety, there was a question raised about the accuracy of the Oncotype DX test because it suggested that she was ER/PR negative. Also her clinical findings appear to suggest low risk disease. Because of these reasons we would like to recheck the Oncotype test and another sample. We will call her with the result of this test.  Patient will need a port, chemotherapy class and plan to start chemotherapy.  However patient is not willing to start chemotherapy right away. She has a plan to go on vacation in June and would like to start chemotherapy only after she returns back from that vacation.  In the interim, I would like her to start letrozole Letrozole counseling provided  I spent 25 minutes talking to the patient of which more than half was spent in counseling and coordination of care.  No orders of the defined types were placed in this encounter.  The patient has a good understanding of the overall plan. she agrees with it. she will call with any problems that may develop before the next visit here.   Rulon Eisenmenger, MD 08/31/16

## 2016-09-03 ENCOUNTER — Telehealth: Payer: Self-pay | Admitting: *Deleted

## 2016-09-03 ENCOUNTER — Ambulatory Visit: Payer: Self-pay | Admitting: Surgery

## 2016-09-03 NOTE — H&P (Signed)
ELFRIDA PIXLEY 08/13/2016 1:55 PM Location: [facility] Patient #: [id number] DOB: [birth year] Single / Language: [name] / Race: Black or African American Female   History of Present Illness [name] A. Eilee Schader MD; 08/13/2016 2:31 PM) Patient words: po lumpectomy   Patient returns 3 weeks after left breast lumpectomy for stage I left breast cancer. Margins and nodes were clear. She is doing well.    1. Breast, lumpectomy, Left w/seed - INVASIVE AND IN SITU DUCTAL CARCINOMA, 1.8 CM. - MARGINS NOT INVOLVED. - INVASIVE AND IN SITU CARCINOMA 0.3 CM FROM INFERIOR MARGIN. - FIBROCYSTIC CHANGES. - SEE ONCOLOGY TABLE. 2. Lymph node, sentinel, biopsy, Left Axillary - ONE BENIGN LYMPH NODE (0/1) 3. Lymph node, sentinel, biopsy, Left - ONE BENIGN LYMPH NODE (0/1) Microscopic Comment 1. BREAST, INVASIVE TUMOR Procedure: Localized lumpectomy and two sentinel lymph node biopsies. Laterality: Left breast. Tumor Size: 1.8 cm Histologic Type: Ductal. Grade: 3 Tubular Differentiation: 3 Nuclear Pleomorphism: 3 Mitotic Count: 3 Ductal Carcinoma in Situ (DCIS): Present, high grade necrosis. Extent of Tumor: Skin: N/A Nipple: N/A Skeletal muscle: N/A Margins: Invasive carcinoma, distance from closest margin: 0.3 cm from inferior margin. DCIS, distance from closest margin: 0.3 cm from inferior. Regional Lymph Nodes: Number of Lymph Nodes Examined: 2 Number of Sentinel Lymph Nodes Examined: 2 1 of 3 FINAL for Soulliere, Shawnie D ([id number]) Microscopic Comment(continued) Lymph Nodes with Macrometastases: 0 Lymph Nodes with Micrometastases: 0 Lymph Nodes with Isolated Tumor Cells: 0 Breast Prognostic Profile: [id number] Estrogen Receptor: 80%, positive, strong staining. Progesterone Receptor: 20%, positive, strong staining. Her2: Negative, ratio 1.18. Ki-67: 60%. Pathologic Stage Classification (pTNM, AJCC 8th Edition): Primary Tumor (pT): pT1c Regional Lymph Nodes (pN):  pN0 Distant Metastases (pM): pMX (JDP:gt, 07/30/16) Claudette Laws MD Pathologist, Electronic Signature (Case signed 07/30/2016) Specimen Gross and Clinical Information Specimen(s) Obtained: 1. Breast, lumpectomy, Left w/seed 2. Lymph node, sentinel, biopsy, Left Axillary 3. Lymph node, sentinel, biopsy, Left Specimen Clinical Information 1. Left breast cancer (nt) Gross 1. Specimen type: Left breast lumpectomy.  The patient is a 56 year old female.   Problem List/Past Medical 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, CMA; 08/13/2016 1:56 PM) Appendectomy  Foot Surgery  Bilateral. Gallbladder Surgery - Open  Gastric Bypass  Hemorrhoidectomy  Hysterectomy (not due to cancer) - Complete  Oral Surgery  Resection of Stomach  Shoulder Surgery  Bilateral. Spinal Surgery - Neck  Tonsillectomy   Diagnostic Studies History Sharyn Lull R. [name], CMA; 08/13/2016 1:56 PM) Colonoscopy  within last year Mammogram  within last year Pap Smear  1-5 years ago  Allergies [name], CMA; 08/13/2016 1:56 PM) OxyCODONE HCl *ANALGESICS - OPIOID*  Propoxyphene Compound *ANALGESICS - OPIOID*  Adhesive Tape  Aspir-81 *ANALGESICS - NonNarcotic*  PredniSONE *CORTICOSTEROIDS*   Medication History ([name], CMA;  08/13/2016 1:56 PM) Gabapentin (100MG Capsule, Oral) Active. Lisinopril (20MG Tablet, Oral) Active. Oxybutynin Chloride (5MG Tablet, Oral) Active. Multivitamins/Minerals (Oral) Active. VESIcare (5MG Tablet, Oral) Active. Robaxin (  500MG Tablet, Oral) Active. Mobic (7.5MG Tablet, Oral) Active. Polyethylene Glycol 3350 (Oral) Active. Calcium Citrate (950MG Tablet, Oral) Active.  Social History [name], CMA; 08/13/2016 1:56 PM) Alcohol use  Remotely quit alcohol use. No caffeine use  No drug use  Tobacco use  Former smoker.  Family History [name], CMA; 08/13/2016 1:56 PM) Alcohol Abuse  Father. Arthritis  Father, Mother. Breast Cancer  Family Members In General. Cervical Cancer  Family Members In General. Colon Polyps  Mother. Hypertension  Father, Mother, Sister. Migraine Headache  Sister. Ovarian Cancer  Family Members In General. Prostate Cancer  Father.  Pregnancy / Birth History Sharyn Lull R. [name], CMA; 08/13/2016 1:56 PM) Age at menarche  41 years. Age of menopause  <46 Contraceptive History  Oral contraceptives. Gravida  3 Maternal age  6-20 Para  0  Other Problems [name], CMA; 08/13/2016 1:56 PM) Arthritis  Asthma  Back Pain  Bladder Problems  Cerebrovascular Accident  Chest pain  Cholelithiasis  Hemorrhoids  High blood pressure  Migraine Headache  Oophorectomy  Bilateral. Pulmonary Embolism / Blood Clot in Legs  Sleep Apnea  Transfusion history   Vitals [name] CMA; 08/13/2016 1:55 PM) 08/13/2016 1:55 PM Weight: 202.5 lb Height: 61in Body Surface Area: 1.9 m Body Mass Index: 38.26 kg/m  BP: 128/76 (Sitting, Left Arm, Standard)       Physical Exam (Ajay Strubel A. Everlyn Farabaugh MD; 08/13/2016 2:31 PM) Breast Note: Left breast incision clean dry and intact. Mild volume loss noted laterally. No signs of seroma or infection.     Assessment & Plan (Mindy Behnken A.  Halo Shevlin MD; 08/13/2016 2:32 PM) POST-OPERATIVE STATE [phone]) Impression: needs a port for chemotherapy  Risk of bleeding  Infection collapse lung injury to mediastinum cardiac injury death blood clots and other access options discussed  She agrees to proceed

## 2016-09-03 NOTE — Telephone Encounter (Signed)
Repeat oncotype sent per Dr. Lindi Adie. Requisition sent to pathology. Received by Tammy.

## 2016-09-04 ENCOUNTER — Telehealth: Payer: Self-pay | Admitting: *Deleted

## 2016-09-04 NOTE — Telephone Encounter (Signed)
"  My throat is swollen.  I started letrozole 09-01-2016.  Last night I had sore throat.  It feels like a golf ball in my throat when I try to eat or drink.  I'm drinking hot tea.  My mom said my lymph nodes in my neck are swollen and my voice has changed.  Should I take the letrozole this morning?"  Verbal order received and read back from Dr. Lindi Adie for patient to stop letrozole for a week until symptoms resolve and resume.  Symptoms not likely from this medicine but hold it.  Order given to patient at this time.  Asked if she should call back.  Yes.  Please call with update on symptoms if better or worse.  No further questions or needs.

## 2016-09-11 ENCOUNTER — Telehealth: Payer: Self-pay | Admitting: Hematology and Oncology

## 2016-09-11 ENCOUNTER — Telehealth: Payer: Self-pay | Admitting: *Deleted

## 2016-09-11 NOTE — Telephone Encounter (Signed)
Called pt to discuss repeat oncotype results of 42. Discussed this is high risk and she will need chemotherapy. Confirmed chemo start date on 6/15. Scheduled chemo ed class for 5/1 and referral placed for nutrition consult. Msg sent to Dr. Brantley Stage regarding port placement.

## 2016-09-11 NOTE — Telephone Encounter (Signed)
Patient was informed of the repeat testing off Oncotype DX showing hiatus disease.

## 2016-09-12 ENCOUNTER — Encounter: Payer: Medicare Other | Admitting: Genetic Counselor

## 2016-09-12 ENCOUNTER — Other Ambulatory Visit: Payer: Medicare Other

## 2016-09-14 NOTE — Pre-Procedure Instructions (Signed)
    [name]  09/14/2016      [place], [address] [place] [address] Phone: [phone] Fax: [phone] Rhunette Croft, [facility] [address] [place] [address] Phone: [phone] Fax: [phone]  [place], [name] [address] Phone: [phone] Fax: [phone]    Your procedure is scheduled on 09/24/16.  Report to [facility] Admitting at 1030 A.M.  Call this number if you have problems the morning of surgery:  [phone]   Remember:  Do not eat food or drink liquids after midnight.  Take these medicines the morning of surgery with A SIP OF WATER ---tylenol,all inhalers,neurontin   Do not wear jewelry, make-up or nail polish.  Do not wear lotions, powders, or perfumes, or deoderant.  Do not shave 48 hours prior to surgery.  Men may shave face and neck.  Do not bring valuables to the hospital.  [facility] is not responsible for any belongings or valuables.  Contacts, dentures or bridgework may not be worn into surgery.  Leave your suitcase in the car.  After surgery it may be brought to your room.  For patients admitted to the hospital, discharge time will be determined by your treatment team.  Patients discharged the day of surgery will not be allowed to drive home.   Name and phone number of your driver:   Special instructions:  Do not take any aspirin,anti-inflammatories,vitamins,or herbal supplements 5-7 days prior to surgery.  Please read over the following fact sheets that you were given.

## 2016-09-17 ENCOUNTER — Encounter (HOSPITAL_COMMUNITY)
Admission: RE | Admit: 2016-09-17 | Discharge: 2016-09-17 | Disposition: A | Discharge status: Home / Self Care | Payer: Medicare Other | Source: Ambulatory Visit | Attending: Surgery | Admitting: Surgery

## 2016-09-17 ENCOUNTER — Telehealth: Payer: Self-pay | Admitting: Hematology and Oncology

## 2016-09-17 ENCOUNTER — Other Ambulatory Visit: Payer: Self-pay | Admitting: Hematology and Oncology

## 2016-09-17 ENCOUNTER — Encounter (HOSPITAL_COMMUNITY): Payer: Self-pay

## 2016-09-17 ENCOUNTER — Other Ambulatory Visit: Payer: Self-pay | Admitting: *Deleted

## 2016-09-17 DIAGNOSIS — C50512 Malignant neoplasm of lower-outer quadrant of left female breast: Secondary | ICD-10-CM

## 2016-09-17 DIAGNOSIS — Z170 Estrogen receptor positive status [ER+]: Secondary | ICD-10-CM

## 2016-09-17 DIAGNOSIS — Z01812 Encounter for preprocedural laboratory examination: Secondary | ICD-10-CM | POA: Insufficient documentation

## 2016-09-17 HISTORY — DX: Anemia, unspecified: D64.9

## 2016-09-17 LAB — CBC WITH DIFFERENTIAL/PLATELET
Basophils Absolute: 0 10*3/uL (ref 0.0–0.1)
Basophils Relative: 1 %
Eosinophils Absolute: 0.1 10*3/uL (ref 0.0–0.7)
Eosinophils Relative: 2 %
HCT: 35.7 % — ABNORMAL LOW (ref 36.0–46.0)
Hemoglobin: 11.8 g/dL — ABNORMAL LOW (ref 12.0–15.0)
Lymphocytes Relative: 49 %
Lymphs Abs: 2.3 10*3/uL (ref 0.7–4.0)
MCH: 31.4 pg (ref 26.0–34.0)
MCHC: 33.1 g/dL (ref 30.0–36.0)
MCV: 94.9 fL (ref 78.0–100.0)
Monocytes Absolute: 0.3 10*3/uL (ref 0.1–1.0)
Monocytes Relative: 6 %
Neutro Abs: 1.9 10*3/uL (ref 1.7–7.7)
Neutrophils Relative %: 42 %
Platelets: 192 10*3/uL (ref 150–400)
RBC: 3.76 MIL/uL — ABNORMAL LOW (ref 3.87–5.11)
RDW: 13.7 % (ref 11.5–15.5)
WBC: 4.6 10*3/uL (ref 4.0–10.5)

## 2016-09-17 LAB — COMPREHENSIVE METABOLIC PANEL
ALT: 11 U/L — ABNORMAL LOW (ref 14–54)
AST: 18 U/L (ref 15–41)
Albumin: 4.1 g/dL (ref 3.5–5.0)
Alkaline Phosphatase: 56 U/L (ref 38–126)
Anion gap: 7 (ref 5–15)
BUN: 13 mg/dL (ref 6–20)
CO2: 26 mmol/L (ref 22–32)
Calcium: 9.3 mg/dL (ref 8.9–10.3)
Chloride: 105 mmol/L (ref 101–111)
Creatinine, Ser: 0.72 mg/dL (ref 0.44–1.00)
GFR calc Af Amer: >60 mL/min (ref >60)
GFR calc non Af Amer: >60 mL/min (ref >60)
Glucose, Bld: 105 mg/dL — ABNORMAL HIGH (ref 65–99)
Potassium: 4.1 mmol/L (ref 3.5–5.1)
Sodium: 138 mmol/L (ref 135–145)
Total Bilirubin: 0.6 mg/dL (ref 0.3–1.2)
Total Protein: 7 g/dL (ref 6.5–8.1)

## 2016-09-17 MED ORDER — DEXAMETHASONE 4 MG PO TABS: 4.0000 mg | ORAL_TABLET | Freq: Every day | ORAL | 0 refills | Status: DC

## 2016-09-17 MED ORDER — ONDANSETRON HCL 8 MG PO TABS: 8.0000 mg | ORAL_TABLET | Freq: Two times a day (BID) | ORAL | 1 refills | Status: DC | PRN

## 2016-09-17 MED ORDER — LORAZEPAM 0.5 MG PO TABS: 0.5000 mg | ORAL_TABLET | Freq: Every day | ORAL | 0 refills | Status: DC

## 2016-09-17 MED ORDER — CHLORHEXIDINE GLUCONATE CLOTH 2 % EX PADS: 6.0000 | MEDICATED_PAD | Freq: Once | CUTANEOUS | Status: DC

## 2016-09-17 MED ORDER — CHLORHEXIDINE GLUCONATE CLOTH 2 % EX PADS
6.0000 | MEDICATED_PAD | Freq: Once | CUTANEOUS | Status: DC
Start: 2016-09-17 — End: 2016-09-18

## 2016-09-17 MED ORDER — LIDOCAINE-PRILOCAINE 2.5-2.5 % EX CREA: TOPICAL_CREAM | CUTANEOUS | 3 refills | Status: DC

## 2016-09-17 MED ORDER — PROCHLORPERAZINE MALEATE 10 MG PO TABS: 10.0000 mg | ORAL_TABLET | Freq: Four times a day (QID) | ORAL | 1 refills | Status: DC | PRN

## 2016-09-17 NOTE — Progress Notes (Signed)
START ON PATHWAY REGIMEN - Breast   Doxorubicin + Cyclophosphamide [facility]):   A cycle is every 21 days:     Doxorubicin      Cyclophosphamide   **Always confirm dose/schedule in your pharmacy ordering system**    Paclitaxel 80 mg/m2 Weekly:   Administer weekly:     Paclitaxel   **Always confirm dose/schedule in your pharmacy ordering system**    Patient Characteristics: Postoperative without Neoadjuvant Therapy (Pathologic Staging), Invasive Disease, Adjuvant Therapy, Node Negative, HER2 Negative/Unknown/Equivocal, ER Positive, Oncotype High Risk (>= 31) Therapeutic Status: Postoperative without Neoadjuvant Therapy (Pathologic Staging) AJCC Grade: G3 AJCC N Category: pN0 AJCC M Category: cM0 ER Status: Positive (+) AJCC 8 Stage Grouping: IA HER2 Status: Negative (-) Oncotype Dx Recurrence Score: 34 AJCC T Category: pT1b PR Status: Positive (+) Has this patient completed genomic testing? Yes - Oncotype DX(R)  Intent of Therapy: Curative Intent, Discussed with Patient

## 2016-09-17 NOTE — Telephone Encounter (Signed)
lvm to inform pt of Nutrition appt 5/24 per LOS

## 2016-09-18 ENCOUNTER — Telehealth: Payer: Self-pay | Admitting: Genetics

## 2016-09-18 ENCOUNTER — Ambulatory Visit: Payer: Self-pay | Admitting: Genetics

## 2016-09-18 ENCOUNTER — Other Ambulatory Visit: Payer: Medicare Other

## 2016-09-18 ENCOUNTER — Encounter: Payer: Self-pay | Admitting: *Deleted

## 2016-09-18 ENCOUNTER — Telehealth (HOSPITAL_COMMUNITY): Payer: Self-pay | Admitting: Vascular Surgery

## 2016-09-18 DIAGNOSIS — Z1379 Encounter for other screening for genetic and chromosomal anomalies: Secondary | ICD-10-CM

## 2016-09-18 NOTE — Telephone Encounter (Deleted)
-----   Message from Mal Misty sent at 08/21/2016 11:32 AM EDT ----- Regarding: Call Results Call with results. Discuss testing for other family members (father, paternal aunt, mother). Route to Dr. Lindi Adie.

## 2016-09-18 NOTE — Telephone Encounter (Signed)
Left pt message to make NP brst early June

## 2016-09-19 ENCOUNTER — Encounter: Payer: Self-pay | Admitting: Genetics

## 2016-09-19 DIAGNOSIS — Z1379 Encounter for other screening for genetic and chromosomal anomalies: Secondary | ICD-10-CM

## 2016-09-19 HISTORY — DX: Encounter for other screening for genetic and chromosomal anomalies: Z13.79

## 2016-09-19 NOTE — Progress Notes (Signed)
HPI: Ms. Inthavong was previously seen in the [facility] clinic due to a personal and family history of cancer and concerns regarding a hereditary predisposition to cancer. Please refer to our prior cancer genetics clinic note for more information regarding Ms. Choyce's medical, social and family histories, and our assessment and recommendations, at the time. Ms. Taylor recent genetic test results were disclosed to her, as were recommendations warranted by these results. These results and recommendations are discussed in more detail below.  CANCER HISTORY:    Malignant neoplasm of lower-outer quadrant of left breast of female, estrogen receptor positive ([place])   06/26/2016 Initial Diagnosis    Left breast biopsy 3:00: IDC with DCIS, grade 3, ER 80%, PR 20%, Ki-67 60%, HER-2 negative ratio 1.18; palpable lump: 1.8 cm lesion, no lymph nodes, T1 cN0 stage I a clinical stage      07/26/2016 Surgery    Left lumpectomy: IDC 1.8 cm, with DCIS, margins negative, 0/2 lymph nodes, ER 80%, PR 20%, HER-2 negative ratio 1.18, Ki-67 60%, T1cN0 stage IA       08/21/2016 Oncotype testing    Oncotype DX recurrence score 51, risk of recurrence 34%      08/21/2016 Genetic Testing    Genetic counseling and testing for hereditary cancer syndromes performed on 08/21/2016. Results are negative for pathogenic mutations in 46 genes analyzed by [facility]'s Common Hereditary Cancers Panel. Results are dated 09/14/2016. Genes tested: APC, ATM, AXIN2, BARD1, BMPR1A, BRCA1, BRCA2, BRIP1, CDH1, CDKN2A, CHEK2, CTNNA1, DICER1, EPCAM, GREM1, HOXB13, KIT, MEN1, MLH1, MSH2, MSH3, MSH6, MUTYH, NBN, NF1, NTHL1, PALB2, PDGFRA, PMS2, POLD1, POLE, PTEN, RAD50, RAD51C, RAD51D, SDHA, SDHB, SDHC, SDHD, SMAD4, SMARCA4, STK11, TP53, TSC1, TSC2, and VHL.          FAMILY HISTORY:  We obtained a detailed, 4-generation family history.  Significant diagnoses are listed below: Family History  Problem Relation Age of Onset  . Breast  cancer Paternal Aunt 84  . Cervical cancer Paternal Grandmother 81    d.40s  . Ovarian cancer Maternal Grandmother 23    d.23  . Colon polyps Father   . Diabetes Father     borderline  . Prostate cancer Father   . Hypertension Mother   . Hypertension    . Breast cancer Sister 31    treated with neoadjuvant chemo/radiation and lumpectomy   Ms. Thurston has no children. She has 2 full-sisters, one full-brother, and one paternal half-brother. One of her full-sisters, Delcie Roch, is currently 82 and was daignosed with breast cancer at age 21. She received neoadjuvant chemo/radiation and underwent lumpectomy. With her permission, we accessed Monika's genetic testing records and found that she tested negative for mutations in 21 genes analyzed by [facility]'s Breast/Gyn panel on 07/21/2014. The following genes were tested: ATM, BARD1, BRCA1, BRCA2, BRIP1, CDH1, CHEK2, EPCAM, FANCC, MLH1, MSH2, MSH6, NBN, PALB2, PMS2, PTEN, RAD51C, RAD51D, STK11, TP53, and XRCC2. Ms. Mincy other full-sister is 39 without cancers. Her full-brother died at 55 from AIDS. Her paternal half-brother is 67 without cancers.  Ms. Luckadoo mother is 67 without cancers. Her mother has a paternal half-sister who is 57 without cancers and a maternal half-brother who died at 62 with possible cancer. Ms. Baruch maternal grandmother died at age 72 of reported ovarian cancer. However, Ms. Barman noted that her maternal and paternal grandmothers both died of gynecologic cancers. One had ovarian and one had cervical and she sometimes can't remember which had which.  Ms. Renken father is 99 and had  prostate cancer more than 10 years ago. He had 6 sisters and 4 brothers. One sister is currently 46 and had breast cancer in her 67s. One brother died at an unknown age with jaw cancer. Ms. Brathwaite paternal grandmother died in her 93s with reported cervical cancer, though, as noted above, could have been ovarian cancer. A female paternal first-cousin has  an unspecified form of cancer.  Ms. Aaron sister, Marqueta Pulley, was previously tested for hereditary breast and gynecologic cancer syndromes through [facility] and was negative for mutations in March 2016. Ms. Lapre did not know of any other previous family history of genetic testing for hereditary cancer risks. Patient's maternal ancestors are of [place] descent, and paternal ancestors are of African American/black descent. There is no reported Ashkenazi Jewish ancestry. There is no known consanguinity.  GENETIC TEST RESULTS: Genetic testing performed through [facility]'s Common Hereditary Cancers Panel reported out on 09/14/2016 showed no deleterious mutations. [facility] Common Hereditary Cancers Panel includes analysis of the following 46 genes: APC, ATM, AXIN2, BARD1, BMPR1A, BRCA1, BRCA2, BRIP1, CDH1, CDKN2A, CHEK2, CTNNA1, DICER1, EPCAM, GREM1, HOXB13, KIT, MEN1, MLH1, MSH2, MSH3, MSH6, MUTYH, NBN, NF1, NTHL1, PALB2, PDGFRA, PMS2, POLD1, POLE, PTEN, RAD50, RAD51C, RAD51D, SDHA, SDHB, SDHC, SDHD, SMAD4, SMARCA4, STK11, TP53, TSC1, TSC2, and VHL.  The test report will be scanned into EPIC and will be located under the Molecular Pathology section of the Results Review tab.A portion of the result report is included below for reference.    We discussed with Ms. Caruth that since the current genetic testing is not perfect, it is possible there may be a gene mutation in one of these genes that current testing cannot detect, but that chance is small. We also discussed that it is possible that another gene that has not yet been discovered, or that we have not yet tested, is responsible for the cancer diagnoses in the family. Therefore, important to remain in touch with cancer genetics in the future so that we can continue to offer Ms. Holtry the most up to date genetic testing.   CANCER SCREENING RECOMMENDATIONS: Given Ms. Casebolt's personal and family histories, we must interpret these  negative results with some caution. Families with features suggestive of hereditary risk for cancer tend to have multiple family members with cancer, diagnoses in multiple generations and diagnoses before the age of 60. Ms. Hodge family exhibits some of these features. Thus this result may simply reflect our current inability to detect all mutations within these genes or there may be a different gene that has not yet been discovered or tested. However, since no causative or actionable mutations were identified, Ms. Sabree's breast cancer treatments and cancer surveillance should be based on other aspects of her diagnosis and health rather than these genetic testing results. Her genetic testing results do not prompt any changes to her care at this time.  RECOMMENDATIONS FOR FAMILY MEMBERS: Women in this family might be at some increased risk of developing cancer, over the general population risk, simply due to the family history of cancer. We recommended women in this family have a yearly mammogram beginning at age 65, or 27 years younger than the earliest onset of cancer, an annual clinical breast exam, and perform monthly breast self-exams. Women in this family should also have a gynecological exam as recommended by their primary provider. All family members should have a colonoscopy by age 46.  FOLLOW-UP: Lastly, we discussed with Ms. Thomassen that cancer genetics is a rapidly advancing  field and it is possible that new genetic tests will be appropriate for her and/or her family members in the future. We encouraged her to remain in contact with cancer genetics on an annual basis so we can update her personal and family histories and let her know of advances in cancer genetics that may benefit this family.   Our contact number was provided. Ms. Coppa questions were answered to her satisfaction, and she knows she is welcome to call us at anytime with additional questions or concerns.   Mal Misty, MS,  [facility] Certified Naval [email or web address]

## 2016-09-19 NOTE — Telephone Encounter (Signed)
I called Ms. Nofziger to disclose genetic testing results. She did not answer, so I left a voicemail requesting a return call. Ms. Hinger returned my call and left me a voicemail. Before I could return her call, she stopped by my office and I disclosed results in person. For more detailed discussion of results, please see genetic counseling encounter from 09/18/2016.

## 2016-09-20 ENCOUNTER — Encounter: Payer: Self-pay | Admitting: Hematology and Oncology

## 2016-09-20 DIAGNOSIS — M79604 Pain in right leg: Secondary | ICD-10-CM | POA: Diagnosis not present

## 2016-09-20 DIAGNOSIS — M25561 Pain in right knee: Secondary | ICD-10-CM | POA: Diagnosis not present

## 2016-09-20 NOTE — Progress Notes (Signed)
Submitted auth request for Lidocaine-Prilocaine today and it was approved from 09/20/16 - 09/20/17.

## 2016-09-20 NOTE — Progress Notes (Signed)
Per [employer] rep, pt's Ondansetron was approved from 09/17/16 - 09/17/17.  Approval letters were mailed to the pt and to the provider's office on 09/18/16.

## 2016-09-21 MED ORDER — CEFAZOLIN SODIUM-DEXTROSE 2-4 GM/100ML-% IV SOLN
2.0000 g | INTRAVENOUS | Status: AC
  Administered 2016-09-24: 2 g via INTRAVENOUS
  Filled 2016-09-21: qty 100

## 2016-09-24 ENCOUNTER — Ambulatory Visit (HOSPITAL_COMMUNITY): Payer: Medicare Other

## 2016-09-24 ENCOUNTER — Ambulatory Visit (HOSPITAL_COMMUNITY): Payer: Medicare Other | Admitting: Certified Registered Nurse Anesthetist

## 2016-09-24 ENCOUNTER — Ambulatory Visit (HOSPITAL_COMMUNITY)
Admission: RE | Admit: 2016-09-24 | Discharge: 2016-09-24 | Disposition: A | Discharge status: Home / Self Care | Payer: Medicare Other | Source: Ambulatory Visit | Attending: Surgery | Admitting: Surgery

## 2016-09-24 ENCOUNTER — Encounter (HOSPITAL_COMMUNITY): Payer: Self-pay | Admitting: Certified Registered Nurse Anesthetist

## 2016-09-24 ENCOUNTER — Encounter (HOSPITAL_COMMUNITY)
Admission: RE | Disposition: A | Discharge status: Home / Self Care | Payer: Self-pay | Source: Ambulatory Visit | Attending: Surgery

## 2016-09-24 DIAGNOSIS — Z170 Estrogen receptor positive status [ER+]: Secondary | ICD-10-CM | POA: Insufficient documentation

## 2016-09-24 DIAGNOSIS — Z803 Family history of malignant neoplasm of breast: Secondary | ICD-10-CM | POA: Insufficient documentation

## 2016-09-24 DIAGNOSIS — Z87891 Personal history of nicotine dependence: Secondary | ICD-10-CM | POA: Insufficient documentation

## 2016-09-24 DIAGNOSIS — Z86711 Personal history of pulmonary embolism: Secondary | ICD-10-CM | POA: Insufficient documentation

## 2016-09-24 DIAGNOSIS — Z8049 Family history of malignant neoplasm of other genital organs: Secondary | ICD-10-CM | POA: Insufficient documentation

## 2016-09-24 DIAGNOSIS — Z6838 Body mass index (BMI) 38.0-38.9, adult: Secondary | ICD-10-CM | POA: Diagnosis not present

## 2016-09-24 DIAGNOSIS — Z90710 Acquired absence of both cervix and uterus: Secondary | ICD-10-CM | POA: Insufficient documentation

## 2016-09-24 DIAGNOSIS — M1990 Unspecified osteoarthritis, unspecified site: Secondary | ICD-10-CM | POA: Diagnosis not present

## 2016-09-24 DIAGNOSIS — Z79899 Other long term (current) drug therapy: Secondary | ICD-10-CM | POA: Insufficient documentation

## 2016-09-24 DIAGNOSIS — C50512 Malignant neoplasm of lower-outer quadrant of left female breast: Secondary | ICD-10-CM | POA: Diagnosis not present

## 2016-09-24 DIAGNOSIS — Z419 Encounter for procedure for purposes other than remedying health state, unspecified: Secondary | ICD-10-CM

## 2016-09-24 DIAGNOSIS — Z8041 Family history of malignant neoplasm of ovary: Secondary | ICD-10-CM | POA: Insufficient documentation

## 2016-09-24 DIAGNOSIS — I739 Peripheral vascular disease, unspecified: Secondary | ICD-10-CM | POA: Diagnosis not present

## 2016-09-24 DIAGNOSIS — C50912 Malignant neoplasm of unspecified site of left female breast: Secondary | ICD-10-CM | POA: Diagnosis not present

## 2016-09-24 DIAGNOSIS — I10 Essential (primary) hypertension: Secondary | ICD-10-CM | POA: Diagnosis not present

## 2016-09-24 DIAGNOSIS — Z95828 Presence of other vascular implants and grafts: Secondary | ICD-10-CM

## 2016-09-24 DIAGNOSIS — J45909 Unspecified asthma, uncomplicated: Secondary | ICD-10-CM | POA: Diagnosis not present

## 2016-09-24 DIAGNOSIS — Z452 Encounter for adjustment and management of vascular access device: Secondary | ICD-10-CM | POA: Diagnosis not present

## 2016-09-24 DIAGNOSIS — Z9884 Bariatric surgery status: Secondary | ICD-10-CM | POA: Diagnosis not present

## 2016-09-24 HISTORY — PX: PORTACATH PLACEMENT: SHX2246

## 2016-09-24 SURGERY — INSERTION, TUNNELED CENTRAL VENOUS DEVICE, WITH PORT
Anesthesia: General | Site: Chest

## 2016-09-24 MED ORDER — EPHEDRINE 5 MG/ML INJ
INTRAVENOUS | Status: AC
  Filled 2016-09-24: qty 10

## 2016-09-24 MED ORDER — MEPERIDINE HCL 25 MG/ML IJ SOLN: 6.2500 mg | INTRAMUSCULAR | Status: DC | PRN

## 2016-09-24 MED ORDER — SODIUM CHLORIDE 0.9 % IV SOLN
INTRAVENOUS | Status: DC | PRN
  Administered 2016-09-24: 13:00:00

## 2016-09-24 MED ORDER — CELECOXIB 100 MG PO CAPS: 100.0000 mg | ORAL_CAPSULE | Freq: Two times a day (BID) | ORAL | 0 refills | Status: DC

## 2016-09-24 MED ORDER — LACTATED RINGERS IV SOLN
INTRAVENOUS | Status: DC
  Administered 2016-09-24: 11:00:00 via INTRAVENOUS

## 2016-09-24 MED ORDER — FENTANYL CITRATE (PF) 250 MCG/5ML IJ SOLN
INTRAMUSCULAR | Status: AC
  Filled 2016-09-24: qty 5

## 2016-09-24 MED ORDER — BUPIVACAINE HCL (PF) 0.25 % IJ SOLN
INTRAMUSCULAR | Status: AC
  Filled 2016-09-24: qty 30

## 2016-09-24 MED ORDER — BUPIVACAINE HCL (PF) 0.25 % IJ SOLN
INTRAMUSCULAR | Status: DC | PRN
  Administered 2016-09-24: 12 mL

## 2016-09-24 MED ORDER — MIDAZOLAM HCL 5 MG/5ML IJ SOLN
INTRAMUSCULAR | Status: DC | PRN
  Administered 2016-09-24: 2 mg via INTRAVENOUS

## 2016-09-24 MED ORDER — MIDAZOLAM HCL 2 MG/2ML IJ SOLN
INTRAMUSCULAR | Status: AC
  Filled 2016-09-24: qty 2

## 2016-09-24 MED ORDER — ONDANSETRON HCL 4 MG/2ML IJ SOLN
INTRAMUSCULAR | Status: DC | PRN
  Administered 2016-09-24: 4 mg via INTRAVENOUS

## 2016-09-24 MED ORDER — HEPARIN SOD (PORK) LOCK FLUSH 100 UNIT/ML IV SOLN
INTRAVENOUS | Status: DC | PRN
  Administered 2016-09-24: 500 [IU] via INTRAVENOUS

## 2016-09-24 MED ORDER — HYDROMORPHONE HCL 1 MG/ML IJ SOLN: 0.2500 mg | INTRAMUSCULAR | Status: DC | PRN

## 2016-09-24 MED ORDER — PROMETHAZINE HCL 25 MG/ML IJ SOLN: 6.2500 mg | INTRAMUSCULAR | Status: DC | PRN

## 2016-09-24 MED ORDER — PROPOFOL 10 MG/ML IV BOLUS
INTRAVENOUS | Status: AC
  Filled 2016-09-24: qty 20

## 2016-09-24 MED ORDER — PROPOFOL 10 MG/ML IV BOLUS
INTRAVENOUS | Status: DC | PRN
  Administered 2016-09-24: 200 mg via INTRAVENOUS

## 2016-09-24 MED ORDER — FENTANYL CITRATE (PF) 100 MCG/2ML IJ SOLN
INTRAMUSCULAR | Status: DC | PRN
  Administered 2016-09-24 (×2): 50 ug via INTRAVENOUS

## 2016-09-24 MED ORDER — ONDANSETRON HCL 4 MG/2ML IJ SOLN
INTRAMUSCULAR | Status: AC
  Filled 2016-09-24: qty 2

## 2016-09-24 MED ORDER — LIDOCAINE HCL (CARDIAC) 20 MG/ML IV SOLN
INTRAVENOUS | Status: DC | PRN
  Administered 2016-09-24: 60 mg via INTRAVENOUS

## 2016-09-24 MED ORDER — HEPARIN SOD (PORK) LOCK FLUSH 100 UNIT/ML IV SOLN
INTRAVENOUS | Status: AC
  Filled 2016-09-24: qty 5

## 2016-09-24 MED ORDER — 0.9 % SODIUM CHLORIDE (POUR BTL) OPTIME
TOPICAL | Status: DC | PRN
  Administered 2016-09-24: 1000 mL

## 2016-09-24 MED ORDER — OXYCODONE HCL 5 MG PO TABS: 5.0000 mg | ORAL_TABLET | Freq: Four times a day (QID) | ORAL | 0 refills | Status: DC | PRN

## 2016-09-24 MED ORDER — SUCCINYLCHOLINE CHLORIDE 200 MG/10ML IV SOSY
PREFILLED_SYRINGE | INTRAVENOUS | Status: AC
  Filled 2016-09-24: qty 10

## 2016-09-24 MED FILL — CELECOXIB 100 MG CAPSULE: 100 | 10 days supply | Qty: 20 | Fill #0

## 2016-09-24 MED FILL — PROCHLORPERAZINE 10 MG TAB: 10 | 8 days supply | Qty: 30 | Fill #0

## 2016-09-24 MED FILL — LIDOCAINE-PRILOCAINE CREAM: 2.5-2.5 | 30 days supply | Qty: 30 | Fill #0

## 2016-09-24 SURGICAL SUPPLY — 49 items
ADH SKN CLS APL DERMABOND .7 (GAUZE/BANDAGES/DRESSINGS) ×1
BAG DECANTER FOR FLEXI CONT (MISCELLANEOUS) ×2 IMPLANT
BLADE SURG 11 STRL SS (BLADE) ×2 IMPLANT
BLADE SURG 15 STRL LF DISP TIS (BLADE) ×1 IMPLANT
BLADE SURG 15 STRL SS (BLADE) ×2
CHLORAPREP W/TINT 26ML (MISCELLANEOUS) ×2 IMPLANT
COVER MAYO STAND STRL (DRAPES) ×2 IMPLANT
COVER SURGICAL LIGHT HANDLE (MISCELLANEOUS) ×2 IMPLANT
COVER TRANSDUCER ULTRASND GEL (DRAPE) ×2 IMPLANT
CRADLE DONUT ADULT HEAD (MISCELLANEOUS) ×2 IMPLANT
DERMABOND ADVANCED (GAUZE/BANDAGES/DRESSINGS) ×1
DERMABOND ADVANCED .7 DNX12 (GAUZE/BANDAGES/DRESSINGS) ×1 IMPLANT
DRAPE C-ARM 42X72 X-RAY (DRAPES) ×2 IMPLANT
DRAPE LAPAROSCOPIC ABDOMINAL (DRAPES) ×2 IMPLANT
DRAPE UTILITY XL STRL (DRAPES) ×2 IMPLANT
ELECT CAUTERY BLADE 6.4 (BLADE) ×2 IMPLANT
ELECT REM PT RETURN 9FT ADLT (ELECTROSURGICAL) ×2
ELECTRODE REM PT RTRN 9FT ADLT (ELECTROSURGICAL) ×1 IMPLANT
GAUZE SPONGE 4X4 16PLY XRAY LF (GAUZE/BANDAGES/DRESSINGS) ×2 IMPLANT
GEL ULTRASOUND 20GR AQUASONIC (MISCELLANEOUS) IMPLANT
GLOVE BIO SURGEON STRL SZ8 (GLOVE) ×2 IMPLANT
GLOVE BIOGEL PI IND STRL 8 (GLOVE) ×1 IMPLANT
GLOVE BIOGEL PI INDICATOR 8 (GLOVE) ×1
GOWN STRL REUS W/ TWL LRG LVL3 (GOWN DISPOSABLE) ×1 IMPLANT
GOWN STRL REUS W/ TWL XL LVL3 (GOWN DISPOSABLE) ×1 IMPLANT
GOWN STRL REUS W/TWL LRG LVL3 (GOWN DISPOSABLE) ×2
GOWN STRL REUS W/TWL XL LVL3 (GOWN DISPOSABLE) ×2
INTRODUCER COOK 11FR (CATHETERS) IMPLANT
KIT BASIN OR (CUSTOM PROCEDURE TRAY) ×2 IMPLANT
KIT PORT POWER 8FR ISP CVUE (Catheter) ×1 IMPLANT
KIT ROOM TURNOVER OR (KITS) ×2 IMPLANT
NDL HYPO 25GX1X1/2 BEV (NEEDLE) ×1 IMPLANT
NEEDLE HYPO 25GX1X1/2 BEV (NEEDLE) ×2 IMPLANT
NS IRRIG 1000ML POUR BTL (IV SOLUTION) ×2 IMPLANT
PACK SURGICAL SETUP 50X90 (CUSTOM PROCEDURE TRAY) ×2 IMPLANT
PAD ARMBOARD 7.5X6 YLW CONV (MISCELLANEOUS) ×2 IMPLANT
PENCIL BUTTON HOLSTER BLD 10FT (ELECTRODE) ×2 IMPLANT
SET INTRODUCER 12FR PACEMAKER (SHEATH) IMPLANT
SET SHEATH INTRODUCER 10FR (MISCELLANEOUS) IMPLANT
SHEATH COOK PEEL AWAY SET 9F (SHEATH) IMPLANT
SUT MNCRL AB 4-0 PS2 18 (SUTURE) ×2 IMPLANT
SUT PROLENE 2 0 SH 30 (SUTURE) ×2 IMPLANT
SUT VIC AB 3-0 SH 27 (SUTURE) ×2
SUT VIC AB 3-0 SH 27X BRD (SUTURE) ×1 IMPLANT
SYR 20ML ECCENTRIC (SYRINGE) ×4 IMPLANT
SYR 5ML LUER SLIP (SYRINGE) ×2 IMPLANT
SYR CONTROL 10ML LL (SYRINGE) ×2 IMPLANT
TOWEL OR 17X24 6PK STRL BLUE (TOWEL DISPOSABLE) ×2 IMPLANT
TOWEL OR 17X26 10 PK STRL BLUE (TOWEL DISPOSABLE) ×2 IMPLANT

## 2016-09-24 NOTE — Discharge Instructions (Signed)
    PORT-A-CATH: POST OP INSTRUCTIONS  Always review your discharge instruction sheet given to you by the facility where your surgery was performed.   1. A prescription for pain medication may be given to you upon discharge. Take your pain medication as prescribed, if needed. If narcotic pain medicine is not needed, then you make take acetaminophen (Tylenol) or ibuprofen (Advil) as needed.  2. Take your usually prescribed medications unless otherwise directed. 3. If you need a refill on your pain medication, please contact our office. All narcotic pain medicine now requires a paper prescription.  Phoned in and fax refills are no longer allowed by law.  Prescriptions will not be filled after 5 pm or on weekends.  4. You should follow a light diet for the remainder of the day after your procedure. 5. Most patients will experience some mild swelling and/or bruising in the area of the incision. It may take several days to resolve. 6. It is common to experience some constipation if taking pain medication after surgery. Increasing fluid intake and taking a stool softener (such as Colace) will usually help or prevent this problem from occurring. A mild laxative (Milk of Magnesia or Miralax) should be taken according to package directions if there are no bowel movements after 48 hours.  7. Unless discharge instructions indicate otherwise, you may remove your bandages 48 hours after surgery, and you may shower at that time. You may have steri-strips (small white skin tapes) in place directly over the incision.  These strips should be left on the skin for 7-10 days.  If your surgeon used Dermabond (skin glue) on the incision, you may shower in 24 hours.  The glue will flake off over the next 2-3 weeks.  8. If your port is left accessed at the end of surgery (needle left in port), the dressing cannot get wet and should only by changed by a healthcare professional. When the port is no longer accessed (when the  needle has been removed), follow step 7.   9. ACTIVITIES:  Limit activity involving your arms for the next 72 hours. Do no strenuous exercise or activity for 1 week. You may drive when you are no longer taking prescription pain medication, you can comfortably wear a seatbelt, and you can maneuver your car. 10.You may need to see your doctor in the office for a follow-up appointment.  Please       check with your doctor.  11.When you receive a new Port-a-Cath, you will get a product guide and        ID card.  Please keep them in case you need them.  WHEN TO CALL YOUR DOCTOR ([phone]): 1. Fever over 101.0 2. Chills 3. Continued bleeding from incision 4. Increased redness and tenderness at the site 5. Shortness of breath, difficulty breathing   The clinic staff is available to answer your questions during regular business hours. Please don't hesitate to call and ask to speak to one of the nurses or medical assistants for clinical concerns. If you have a medical emergency, go to the nearest emergency room or call 911.  A surgeon from [facility] is always on call at the hospital.     For further information, please visit [email or web address]      

## 2016-09-24 NOTE — Interval H&P Note (Signed)
History and Physical Interval Note:  09/24/2016 11:27 AM  [name]  has presented today for surgery, with the diagnosis of BREAST CANCER  The various methods of treatment have been discussed with the patient and family. After consideration of risks, benefits and other options for treatment, the patient has consented to  Procedure(s): INSERTION PORT-A-CATH WITH [name] (N/A) as a surgical intervention .  The patient's history has been reviewed, patient examined, no change in status, stable for surgery.  I have reviewed the patient's chart and labs.  Questions were answered to the patient's satisfaction.    Risk of bleeding  Infection  Collapse lung   Organ injury catheter migration,  Death,  DVT,  Catheter malfunction   Alternatives discussed  She agrees to proceed    Chrishawn Kring A.

## 2016-09-24 NOTE — H&P (View-Only) (Signed)
[name] 08/13/2016 1:55 PM Location: [facility] Patient #: [id number] DOB: 07/14/1960 Single / Language: English / Race: Black or African American Female   History of Present Illness ([name] MD; 08/13/2016 2:31 PM) Patient words: po lumpectomy   Patient returns 3 weeks after left breast lumpectomy for stage I left breast cancer. Margins and nodes were clear. She is doing well.    1. Breast, lumpectomy, Left w/seed - INVASIVE AND IN SITU DUCTAL CARCINOMA, 1.8 CM. - MARGINS NOT INVOLVED. - INVASIVE AND IN SITU CARCINOMA 0.3 CM FROM INFERIOR MARGIN. - FIBROCYSTIC CHANGES. - SEE ONCOLOGY TABLE. 2. Lymph node, sentinel, biopsy, Left Axillary - ONE BENIGN LYMPH NODE (0/1) 3. Lymph node, sentinel, biopsy, Left - ONE BENIGN LYMPH NODE (0/1) Microscopic Comment 1. BREAST, INVASIVE TUMOR Procedure: Localized lumpectomy and two sentinel lymph node biopsies. Laterality: Left breast. Tumor Size: 1.8 cm Histologic Type: Ductal. Grade: 3 Tubular Differentiation: 3 Nuclear Pleomorphism: 3 Mitotic Count: 3 Ductal Carcinoma in Situ (DCIS): Present, high grade necrosis. Extent of Tumor: Skin: N/A Nipple: N/A Skeletal muscle: N/A Margins: Invasive carcinoma, distance from closest margin: 0.3 cm from inferior margin. DCIS, distance from closest margin: 0.3 cm from inferior. Regional Lymph Nodes: Number of Lymph Nodes Examined: 2 Number of Sentinel Lymph Nodes Examined: 2 1 of 3 FINAL for Alicia, Braileigh [name] ([id number]) Microscopic Comment(continued) Lymph Nodes with Macrometastases: 0 Lymph Nodes with Micrometastases: 0 Lymph Nodes with Isolated Tumor Cells: 0 Breast Prognostic Profile: [id number] Estrogen Receptor: 80%, positive, strong staining. Progesterone Receptor: 20%, positive, strong staining. Her2: Negative, ratio 1.18. Ki-67: 60%. Pathologic Stage Classification (pTNM, AJCC 8th Edition): Primary Tumor (pT): pT1c Regional Lymph Nodes (pN):  pN0 Distant Metastases (pM): pMX (JDP:gt, 07/30/16) JOHN PATRICK MD Pathologist, Electronic Signature (Case signed 07/30/2016) Specimen Gross and Clinical Information Specimen(s) Obtained: 1. Breast, lumpectomy, Left w/seed 2. Lymph node, sentinel, biopsy, Left Axillary 3. Lymph node, sentinel, biopsy, Left Specimen Clinical Information 1. Left breast cancer (nt) Gross 1. Specimen type: Left breast lumpectomy.  The patient is a 56 year old female.   Problem List/Past Medical ([name], CMA; 08/13/2016 1:56 PM) ASTHMA (J45.909)  ARTHRITIS (M19.90)  MALNUTRITION FOLLOWING GASTROINTESTINAL SURGERY (K91.2)  ABDOMINAL PAIN, ACUTE, PERIUMBILICAL (R10.33)  WEIGHT GAIN FOLLOWING GASTRIC BYPASS SURGERY (R63.5)  HTN (HYPERTENSION) (I10)  GASTRIC BYPASS STATUS FOR OBESITY (Z98.84)  Check "glycemic index of food"--want foods that are complex carbohydrates Check blood work for vitamin deficiency Take "Coromega" 1 pkg daily BREAST CANCER, LEFT (C50.912)  MORBID OBESITY, UNSPECIFIED OBESITY TYPE (E66.01)  MATERNAL PULMONARY EMBOLUS (PE), HISTORY OF (Z86.711, Z87.59)  CVA (CEREBRAL VASCULAR ACCIDENT) (I63.9)   Past Surgical History ([name], CMA; 08/13/2016 1:56 PM) Appendectomy  Foot Surgery  Bilateral. Gallbladder Surgery - Open  Gastric Bypass  Hemorrhoidectomy  Hysterectomy (not due to cancer) - Complete  Oral Surgery  Resection of Stomach  Shoulder Surgery  Bilateral. Spinal Surgery - Neck  Tonsillectomy   Diagnostic Studies History ([name], CMA; 08/13/2016 1:56 PM) Colonoscopy  within last year Mammogram  within last year Pap Smear  1-5 years ago  Allergies ([name], CMA; 08/13/2016 1:56 PM) OxyCODONE HCl *ANALGESICS - OPIOID*  Propoxyphene Compound *ANALGESICS - OPIOID*  Adhesive Tape  Aspir-81 *ANALGESICS - NonNarcotic*  PredniSONE *CORTICOSTEROIDS*   Medication History ([name], CMA;  08/13/2016 1:56 PM) Gabapentin (100MG Capsule, Oral) Active. Lisinopril (20MG Tablet, Oral) Active. Oxybutynin Chloride (5MG Tablet, Oral) Active. Multivitamins/Minerals (Oral) Active. VESIcare (5MG Tablet, Oral) Active. Robaxin (  500MG Tablet, Oral) Active. Mobic (7.5MG Tablet, Oral) Active. Polyethylene Glycol 3350 (Oral) Active. Calcium Citrate (950MG Tablet, Oral) Active.  Social History [name], CMA; 08/13/2016 1:56 PM) Alcohol use  Remotely quit alcohol use. No caffeine use  No drug use  Tobacco use  Former smoker.  Family History [name], CMA; 08/13/2016 1:56 PM) Alcohol Abuse  Father. Arthritis  Father, Mother. Breast Cancer  Family Members In General. Cervical Cancer  Family Members In General. Colon Polyps  Mother. Hypertension  Father, Mother, Sister. Migraine Headache  Sister. Ovarian Cancer  Family Members In General. Prostate Cancer  Father.  Pregnancy / Birth History Sharyn Lull R. [name], CMA; 08/13/2016 1:56 PM) Age at menarche  41 years. Age of menopause  <46 Contraceptive History  Oral contraceptives. Gravida  3 Maternal age  6-20 Para  0  Other Problems [name], CMA; 08/13/2016 1:56 PM) Arthritis  Asthma  Back Pain  Bladder Problems  Cerebrovascular Accident  Chest pain  Cholelithiasis  Hemorrhoids  High blood pressure  Migraine Headache  Oophorectomy  Bilateral. Pulmonary Embolism / Blood Clot in Legs  Sleep Apnea  Transfusion history   Vitals [name] CMA; 08/13/2016 1:55 PM) 08/13/2016 1:55 PM Weight: 202.5 lb Height: 61in Body Surface Area: 1.9 m Body Mass Index: 38.26 kg/m  BP: 128/76 (Sitting, Left Arm, Standard)       Physical Exam ([name] MD; 08/13/2016 2:31 PM) Breast Note: Left breast incision clean dry and intact. Mild volume loss noted laterally. No signs of seroma or infection.     Assessment & Plan (Ranyah Groeneveld A.  Laydon Martis MD; 08/13/2016 2:32 PM) POST-OPERATIVE STATE [phone]) Impression: needs a port for chemotherapy  Risk of bleeding  Infection collapse lung injury to mediastinum cardiac injury death blood clots and other access options discussed  She agrees to proceed

## 2016-09-24 NOTE — Transfer of Care (Signed)
Immediate Anesthesia Transfer of Care Note  Patient: [name]  Procedure(s) Performed: Procedure(s): INSERTION PORT-A-CATH WITH [name] (N/A)  Patient Location: PACU  Anesthesia Type:General  Level of Consciousness: awake, alert , oriented and patient cooperative  Airway & Oxygen Therapy: Patient Spontanous Breathing and Patient connected to nasal cannula oxygen  Post-op Assessment: Report given to RN, Post -op Vital signs reviewed and stable and Patient moving all extremities X 4  Post vital signs: Reviewed and stable  Last Vitals:  Vitals:   09/24/16 1116  BP: (!) 144/69  Pulse: 61  Resp: 20  Temp: 36.8 C    Last Pain:  Vitals:   09/24/16 1116  TempSrc: Oral      Patients Stated Pain Goal: 3 (20/72/18 2883)  Complications: No apparent anesthesia complications

## 2016-09-24 NOTE — Anesthesia Preprocedure Evaluation (Addendum)
Anesthesia Evaluation  Patient identified by MRN, date of birth, ID band Patient awake    Reviewed: Allergy & Precautions, NPO status , Patient's Chart, lab work & pertinent test results  History of Anesthesia Complications (+) history of anesthetic complications  Airway Mallampati: II  TM Distance: >3 FB Neck ROM: Full    Dental no notable dental hx. (+) Caps, Dental Advisory Given   Pulmonary asthma , sleep apnea and Continuous Positive Airway Pressure Ventilation , former smoker,    Pulmonary exam normal breath sounds clear to auscultation       Cardiovascular hypertension, Pt. on medications + Peripheral Vascular Disease  Normal cardiovascular exam Rhythm:Regular Rate:Normal     Neuro/Psych  Headaches,  Neuromuscular disease CVA, Residual Symptoms    GI/Hepatic negative GI ROS, Neg liver ROS,   Endo/Other  Morbid obesityObesity Left Breast Ca  Renal/GU negative Renal ROS  negative genitourinary   Musculoskeletal  (+) Arthritis , Osteoarthritis,  Fibromyalgia -  Abdominal (+) + obese,   Peds  Hematology negative hematology ROS (+) anemia ,   Anesthesia Other Findings Weakness Right leg and difficulty finding some words at times post CVA  Reproductive/Obstetrics                            Anesthesia Physical  Anesthesia Plan  ASA: III  Anesthesia Plan: General   Post-op Pain Management:  Regional for Post-op pain   Induction: Intravenous  Airway Management Planned: LMA  Additional Equipment:   Intra-op Plan:   Post-operative Plan: Extubation in OR  Informed Consent: I have reviewed the patients History and Physical, chart, labs and discussed the procedure including the risks, benefits and alternatives for the proposed anesthesia with the patient or authorized representative who has indicated his/her understanding and acceptance.   Dental advisory given  Plan Discussed  with: Anesthesiologist, CRNA and Surgeon  Anesthesia Plan Comments:         Anesthesia Quick Evaluation

## 2016-09-24 NOTE — Anesthesia Procedure Notes (Signed)
Procedure Name: LMA Insertion Date/Time: 09/24/2016 12:25 PM Performed by: Carney Living Pre-anesthesia Checklist: Patient identified, Emergency Drugs available, Suction available, Patient being monitored and Timeout performed Patient Re-evaluated:Patient Re-evaluated prior to inductionOxygen Delivery Method: Circle system utilized Preoxygenation: Pre-oxygenation with 100% oxygen Intubation Type: IV induction LMA: LMA inserted LMA Size: 4.0 Number of attempts: 1 Placement Confirmation: positive ETCO2 and breath sounds checked- equal and bilateral Tube secured with: Tape Dental Injury: Teeth and Oropharynx as per pre-operative assessment

## 2016-09-24 NOTE — Anesthesia Postprocedure Evaluation (Signed)
Anesthesia Post Note  Patient: [name]  Procedure(s) Performed: Procedure(s) (LRB): INSERTION PORT-A-CATH WITH [name] (N/A)  Patient location during evaluation: PACU Anesthesia Type: General Level of consciousness: awake Pain management: pain level controlled Vital Signs Assessment: post-procedure vital signs reviewed and stable Cardiovascular status: stable Anesthetic complications: no       Last Vitals:  Vitals:   09/24/16 1116  BP: (!) 144/69  Pulse: 61  Resp: 20  Temp: 36.8 C    Last Pain:  Vitals:   09/24/16 1116  TempSrc: Oral                 Deniel Mcquiston

## 2016-09-24 NOTE — Op Note (Signed)
Preoperative diagnosis: PAC needed for chemotherapy  Postoperative diagnosis: Same  Procedure: Portacath Placement with U/S and C arm guidence  Surgeon: Turner Daniels, MD, FACS  Anesthesia: General and 0.25 % marcaine with epinephrine  Clinical History and Indications: The patient is getting ready to begin chemotherapy for her cancer. She  needs a Port-A-Cath for venous access. Risk of bleeding, infection,  Collapse lung,  Death,  DVT,  Organ injury,  Mediastinal injury,  Injury to heart,  Injury to blood vessels,  Nerves,  Migration of catheter,  Embolization of catheter and the need for more surgery.  Description of Procedure: I have seen the patient in the holding area and confirmed the plans for the procedure as noted above. I reviewed the risks and complications again and the patient has no further questions. She wishes to proceed.   The patient was then taken to the operating room. After satisfactory general  anesthesia had been obtained the upper chest and lower neck were prepped and draped as a sterile field. The timeout was done.  The right internal jugular vein  was entered under U/S guidance  and the guidewire threaded into the superior vena cava right atrial area under fluoroscopic guidance. An incision was then made on the anterior chest wall and a subcutaneous pocket fashioned for the port reservoir.  The port tubing was then brought through a subcutaneous tunnel from the port site to the guidewire site.  The port and catheter were attached, locked  and flushed. The catheter was measured and cut to appropriate length.The dilator and peel-away sheath were then advanced over the guidewire while monitoring this with fluoroscopy. The guidewire and dilator were removed and the tubing threaded to approximately 20  cm. The peel-away sheath was then removed. The catheter aspirated and flushed easily. Using fluoroscopy the tip was in the superior vena cava right atrial junction area. It  aspirated and flushed easily. That aspirated and flushed easily.  The reservoir was secured to the fascia with 1 sutures of 2-0 Prolene. A final check with fluoroscopy was done to make sure we had no kinks and good positioning of the tip of the catheter. Everything appeared to be okay. The catheter was aspirated, flushed with dilute heparin and then concentrated aqueous heparin.  The incision was then closed with interrupted 3-0 Vicryl, and 4-0 Monocryl subcuticular with Dermabond on the skin.  There were no operative complications. Estimated blood loss was minimal. All counts were correct. The patient tolerated the procedure well.  Turner Daniels, MD, FACS

## 2016-09-25 ENCOUNTER — Encounter (HOSPITAL_COMMUNITY): Payer: Self-pay | Admitting: Surgery

## 2016-09-25 ENCOUNTER — Telehealth: Payer: Self-pay | Admitting: *Deleted

## 2016-09-25 ENCOUNTER — Other Ambulatory Visit: Payer: Self-pay | Admitting: *Deleted

## 2016-09-25 DIAGNOSIS — C50919 Malignant neoplasm of unspecified site of unspecified female breast: Secondary | ICD-10-CM

## 2016-09-25 DIAGNOSIS — Z170 Estrogen receptor positive status [ER+]: Principal | ICD-10-CM

## 2016-09-25 NOTE — Telephone Encounter (Signed)
Schedule appt for echo on 10/11/16 at 10:45. Left detailed msg for pt regarding appt and request return call.

## 2016-10-05 MED FILL — LETROZOLE 2.5 MG TABLET: 2.5 | 30 days supply | Qty: 30 | Fill #0

## 2016-10-08 MED FILL — AMOXICILLIN 875 MG TABLET: 875 | 7 days supply | Qty: 14 | Fill #0

## 2016-10-08 MED FILL — CYCLOBENZAPRINE 5 MG TABLET: 5 | 30 days supply | Qty: 90 | Fill #0

## 2016-10-08 MED FILL — LISINOPRIL 20 MG TABLET: 20 | 90 days supply | Qty: 90 | Fill #0

## 2016-10-08 MED FILL — ACETAMINOPHEN/COD #3 TABLET: 300-30 | 4 days supply | Qty: 20 | Fill #0

## 2016-10-11 ENCOUNTER — Ambulatory Visit: Payer: Medicare Other | Admitting: Nutrition

## 2016-10-11 ENCOUNTER — Ambulatory Visit (HOSPITAL_COMMUNITY)
Admission: RE | Admit: 2016-10-11 | Discharge: 2016-10-11 | Disposition: A | Discharge status: Home / Self Care | Payer: Medicare Other | Source: Ambulatory Visit | Attending: Hematology and Oncology | Admitting: Hematology and Oncology

## 2016-10-11 DIAGNOSIS — C50919 Malignant neoplasm of unspecified site of unspecified female breast: Secondary | ICD-10-CM | POA: Diagnosis not present

## 2016-10-11 DIAGNOSIS — Z170 Estrogen receptor positive status [ER+]: Secondary | ICD-10-CM | POA: Diagnosis not present

## 2016-10-11 NOTE — Progress Notes (Signed)
56 year old female diagnosed with ER positive breast cancer.  She is a patient of Dr. Lindi Adie and is starting chemotherapy on June 15.  Past medical history includes a stroke, gastric bypass in 2009, hypertension, and DVT.  Medications include calcium citrate, vitamin D, Celebrex, Ativan, multivitamin, Zofran, and Compazine.  Labs include glucose 105.  Height: 5 feet 1 inch. Weight: 199.6 pounds on April 30. Usual body weight: 200 pounds. BMI: 37.71.  Patient has questions on how to eat during chemotherapy with a history of gastric bypass.  Nutrition diagnosis:  Food and nutrition related knowledge deficit related to new diagnosis of breast cancer and associated treatments as evidenced by no prior need for nutrition related information.  Intervention: Educated patient to consume small meals with high protein foods 6 times daily. Encouraged plant-based diet as tolerated. Enforced importance of taking vitamins and minerals after bypass surgery. Encouraged patient to continue high-protein supplements as needed. Provided fact sheets.  Questions were answered.  Teach back method used.  Contact information given.  Monitoring, evaluation, goals: Patient will tolerate adequate calories and protein for weight stabilization, during treatment.  Next visit: Patient will contact me for follow-up as needed.  **Disclaimer: This note was dictated with voice recognition software. Similar sounding words can inadvertently be transcribed and this note may contain transcription errors which may not have been corrected upon publication of note.**

## 2016-10-11 NOTE — Progress Notes (Signed)
  Echocardiogram 2D Echocardiogram has been performed.  [name] 10/11/2016, 12:19 PM

## 2016-10-12 DIAGNOSIS — M25561 Pain in right knee: Secondary | ICD-10-CM | POA: Diagnosis not present

## 2016-10-12 DIAGNOSIS — M25562 Pain in left knee: Secondary | ICD-10-CM | POA: Diagnosis not present

## 2016-10-12 DIAGNOSIS — M170 Bilateral primary osteoarthritis of knee: Secondary | ICD-10-CM | POA: Diagnosis not present

## 2016-10-12 DIAGNOSIS — G8929 Other chronic pain: Secondary | ICD-10-CM | POA: Diagnosis not present

## 2016-10-18 MED FILL — OXYBUTYNIN 5 MG TABLET: 5 | 90 days supply | Qty: 180 | Fill #1

## 2016-11-01 NOTE — Assessment & Plan Note (Addendum)
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 Adjuvant chemotherapy with Taxotere and Cytoxan every 3 weeks 4 cycles 2. Adjuvant radiation therapy followed by 3. Adjuvant antiestrogen therapy --------------------------------------------------------------------------------------------------------------------------------  [name] will proceed with chemotherapy today.  I reviewed with her the chemotherapy regimen, common adverse effects, her anti-emetics, and other tips.  She will receive onpro and I have instructed her to take claritin starting the day after treatment.  Her labs are normal today, I reviewed them with her in detail and gave her a copy of the lab results.    [name] will return in one week for labs and evaluation.

## 2016-11-01 NOTE — Progress Notes (Signed)
[facility] Cancer Follow up:    Harris, William, MD [address] [place] [address]   DIAGNOSIS: Cancer Staging Malignant neoplasm of lower-outer quadrant of left breast of female, estrogen receptor positive (HCC) Staging form: Breast, AJCC 8th Edition - Clinical: Stage IA (cT1c, cN0(sn), cM0, G3, ER: Positive, PR: Positive, HER2: Negative, Oncotype DX score: 51) - Unsigned   SUMMARY OF ONCOLOGIC HISTORY:   Malignant neoplasm of lower-outer quadrant of left breast of female, estrogen receptor positive (HCC)   06/26/2016 Initial Diagnosis    Left breast biopsy 3:00: IDC with DCIS, grade 3, ER 80%, PR 20%, Ki-67 60%, HER-2 negative ratio 1.18; palpable lump: 1.8 cm lesion, no lymph nodes, T1 cN0 stage I a clinical stage      07/26/2016 Surgery    Left lumpectomy: IDC 1.8 cm, with DCIS, margins negative, 0/2 lymph nodes, ER 80%, PR 20%, HER-2 negative ratio 1.18, Ki-67 60%, T1cN0 stage IA       08/21/2016 Oncotype testing    Oncotype DX recurrence score 51, risk of recurrence 34%      08/21/2016 Genetic Testing    Genetic counseling and testing for hereditary cancer syndromes performed on 08/21/2016. Results are negative for pathogenic mutations in 46 genes analyzed by [facility]'s Common Hereditary Cancers Panel. Results are dated 09/14/2016. Genes tested: APC, ATM, AXIN2, BARD1, BMPR1A, BRCA1, BRCA2, BRIP1, CDH1, CDKN2A, CHEK2, CTNNA1, DICER1, EPCAM, GREM1, HOXB13, KIT, MEN1, MLH1, MSH2, MSH3, MSH6, MUTYH, NBN, NF1, NTHL1, PALB2, PDGFRA, PMS2, POLD1, POLE, PTEN, RAD50, RAD51C, RAD51D, SDHA, SDHB, SDHC, SDHD, SMAD4, SMARCA4, STK11, TP53, TSC1, TSC2, and VHL.         CURRENT THERAPY: Doxorubicin and Cyclophosphamide cycle 1 day 1  INTERVAL HISTORY: Sheritha D Delatorre 56 y.o. female returns for evaluation prior to receiving her first cycle of chemotherapy with Doxorubicin and Cyclophosphamide.  She is doing very well today.  She says she doesn't have any  questions about chemotherapy.  She has picked up biotene, she has compazine, and she understands how to take her anti-emetics.    Patient Active Problem List   Diagnosis Date Noted  . Port catheter in place 11/02/2016  . Genetic testing 09/19/2016  . Pre-operative clearance 07/23/2016  . Malignant neoplasm of lower-outer quadrant of left breast of female, estrogen receptor positive (HCC) 07/10/2016  . Essential hypertension   . Chest pain 09/30/2015  . DVT, lower extremity (HCC) 06/18/2011  . DVT of upper extremity (deep vein thrombosis) (HCC) 06/18/2011  . [place] filter in place 06/18/2011  . History of stroke 06/18/2011  . PFO (patent foramen ovale) 06/18/2011  . Status post gastric bypass for obesity 06/18/2011  . PELVIC PAIN, CHRONIC 07/20/2008  . FIBROIDS, UTERUS 07/16/2008  . ASTHMA 07/16/2008  . FIBROMYALGIA 07/16/2008  . CARPAL TUNNEL SYNDROME, HX OF 07/16/2008  . History of pulmonary embolus (PE) 07/16/2008    is allergic to aspirin; oxycodone hcl; propoxyphene n-acetaminophen; tramadol; adhesive [tape]; and prednisone.  MEDICAL HISTORY: Past Medical History:  Diagnosis Date  . Anemia    since bypass  . Arthritis    osteoarthritis  . Asthma    states no asthma attack since 2002  . Breast cancer (HCC) 06/26/16 bx   left breast  . Chronic back pain   . Complication of anesthesia    states takes more than normal to put her to sleep  . Dental bridge present    upper  . Dental crowns present   . DVT of upper extremity (  deep vein thrombosis) (HCC)   . Fibromyalgia   . Genetic testing 09/19/2016   Ms. Pastrana underwent genetic counseling and testing for hereditary cancer syndromes on 08/21/2016. Her results were negative for mutations in all 46 genes analyzed by [facility]'s 46-gene Common Hereditary Cancers Panel. Genes analyzed include: APC, ATM, AXIN2, BARD1, BMPR1A, BRCA1, BRCA2, BRIP1, CDH1, CDKN2A, CHEK2, CTNNA1, DICER1, EPCAM, GREM1, HOXB13, KIT, MEN1, MLH1, MSH2,  MSH3, MSH6, MUTYH, NBN,   . Headache(784.0)    migraines  . History of blood transfusion 06/2005  . History of gallstones   . History of pneumonia   . History of shingles 07/2011  . HTN (hypertension)   . Normal coronary arteries 2003  . PFO (patent foramen ovale)   . Presence of inferior vena cava filter   . Pulmonary embolism (HCC)   . Sleep apnea    used CPAP until after bypass surg.  . Status post gastric bypass for obesity   . Stroke (HCC) 12/2002   right-sided weakness  . Urinary incontinence     SURGICAL HISTORY: Past Surgical History:  Procedure Laterality Date  . ABDOMINAL HYSTERECTOMY  11/1997   complete  . ANTERIOR CERVICAL DECOMP/DISCECTOMY FUSION  02/05/2005   C5-6  . APPENDECTOMY  10/22/2008   laparoscopic  . BREAST LUMPECTOMY WITH RADIOACTIVE SEED AND SENTINEL LYMPH NODE BIOPSY Left 07/26/2016   Procedure: BREAST LUMPECTOMY WITH RADIOACTIVE SEED AND SENTINEL LYMPH NODE BIOPSY;  Surgeon: Thomas Cornett, MD;  Location: [facility];  Service: General;  Laterality: Left;  . BUNIONECTOMY  05/1980   both feet  . BUNIONECTOMY  08/2011   left foot  . CARDIAC CATHETERIZATION  03/04/2002  . CARPAL TUNNEL RELEASE  06/21/2009   right  . CARPAL TUNNEL RELEASE     left hand  . CARPAL TUNNEL RELEASE  10/03/2011   Procedure: CARPAL TUNNEL RELEASE;  Surgeon: Gary R Kuzma, MD;  Location: [facility];  Service: Orthopedics;  Laterality: Right;  CARPAL TUNNEL WITH HYPOTHENAR FAT PAD TRANSFER  . CERVICAL SPINE SURGERY  01/2005   titanium plate implanted  . CHOLECYSTECTOMY  1990  . ELBOW SURGERY  08/09/2004   decompression ulnar nerve right elbow  . ENTEROLYSIS  10/22/2008   laparoscopic abd. enterolysis  . GASTRIC ROUX-EN-Y  2009  . HEEL SPUR SURGERY  08/1997   left  . HEMORRHOID SURGERY  03/1993  . LAPAROSCOPIC LYSIS INTESTINAL ADHESIONS  02/14/2000  . NAILBED REPAIR  01/10/2005; 08/2011   exc. matrix bilat. great toe  . OTHER SURGICAL HISTORY  12/1986   pt  states that she had surgery to unclog her fallopean tubes  . PORTACATH PLACEMENT N/A 09/24/2016   Procedure: INSERTION PORT-A-CATH WITH US;  Surgeon: Cornett, Thomas, MD;  Location: [facility] OR;  Service: General;  Laterality: N/A;  . SHOULDER SURGERY     bilat. - (left:  06/2005)  . TONSILLECTOMY  07/1995  . TRIGGER FINGER RELEASE  04/25/2006   decompression A-1 pulley left thumb  . UTERINE FIBROID SURGERY  12/95, 7/96   x2  . VENA CAVA FILTER PLACEMENT  2009   during Roux-en-Y surg.    SOCIAL HISTORY: Social History   Social History  . Marital status: Single    Spouse name: N/A  . Number of children: 0  . Years of education: N/A   Occupational History  . disabled    Social History Main Topics  . Smoking status: Former Smoker  . Smokeless tobacco: Never Used     Comment: quit   smoking 08/1989  . Alcohol use No  . Drug use: No  . Sexual activity: No   Other Topics Concern  . Not on file   Social History Narrative  . No narrative on file    FAMILY HISTORY: Family History  Problem Relation Age of Onset  . Breast cancer Paternal Aunt 40  . Cervical cancer Paternal Grandmother 40       d.40s  . Ovarian cancer Maternal Grandmother 23       d.23  . Colon polyps Father   . Diabetes Father        borderline  . Prostate cancer Father   . Hypertension Mother   . Hypertension Unknown   . Breast cancer Sister 51       treated with neoadjuvant chemo/radiation and lumpectomy    Review of Systems  Constitutional: Negative for appetite change, chills, diaphoresis, fatigue, fever and unexpected weight change.  HENT:   Negative for hearing loss, lump/mass and mouth sores.   Eyes: Negative for eye problems and icterus.  Respiratory: Negative for chest tightness, cough and shortness of breath.   Cardiovascular: Negative for chest pain, leg swelling and palpitations.  Gastrointestinal: Negative for abdominal distention, abdominal pain, constipation, diarrhea, nausea and vomiting.   Genitourinary: Negative for difficulty urinating.   Musculoskeletal: Negative for arthralgias.  Skin: Negative for itching and rash.  Neurological: Negative for dizziness, extremity weakness and headaches.  Hematological: Negative for adenopathy. Does not bruise/bleed easily.  Psychiatric/Behavioral: Negative for depression. The patient is not nervous/anxious.       PHYSICAL EXAMINATION  ECOG PERFORMANCE STATUS: 0 - Asymptomatic  Vitals:   11/02/16 1105  BP: (!) 113/48  Pulse: 64  Resp: 18  Temp: 98.4 F (36.9 C)    Physical Exam  Constitutional: She is oriented to person, place, and time and well-developed, well-nourished, and in no distress.  HENT:  Head: Normocephalic and atraumatic.  Mouth/Throat: Oropharynx is clear and moist. No oropharyngeal exudate.  Eyes: Pupils are equal, round, and reactive to light. No scleral icterus.  Neck: Neck supple.  Cardiovascular: Normal rate, regular rhythm and normal heart sounds.   Pulmonary/Chest: Effort normal and breath sounds normal. No respiratory distress. She has no wheezes. She has no rales.  Abdominal: Soft. Bowel sounds are normal. She exhibits no distension. There is no tenderness.  Musculoskeletal: She exhibits no edema.  Lymphadenopathy:    She has no cervical adenopathy.  Neurological: She is alert and oriented to person, place, and time.  Skin: Skin is warm and dry.  Psychiatric: Mood and affect normal.    LABORATORY DATA:  CBC    Component Value Date/Time   WBC 4.8 11/02/2016 1039   WBC 4.6 09/17/2016 1316   RBC 3.73 11/02/2016 1039   RBC 3.76 (L) 09/17/2016 1316   HGB 11.7 11/02/2016 1039   HCT 35.3 11/02/2016 1039   PLT 191 11/02/2016 1039   MCV 94.5 11/02/2016 1039   MCH 31.3 11/02/2016 1039   MCH 31.4 09/17/2016 1316   MCHC 33.1 11/02/2016 1039   MCHC 33.1 09/17/2016 1316   RDW 14.3 11/02/2016 1039   LYMPHSABS 1.7 11/02/2016 1039   MONOABS 0.5 11/02/2016 1039   EOSABS 0.1 11/02/2016 1039    BASOSABS 0.1 11/02/2016 1039    CMP     Component Value Date/Time   NA 141 11/02/2016 1039   K 4.2 11/02/2016 1039   CL 105 09/17/2016 1316   CO2 26 11/02/2016 1039   GLUCOSE 75 11/02/2016 1039     BUN 18.5 11/02/2016 1039   CREATININE 0.8 11/02/2016 1039   CALCIUM 9.5 11/02/2016 1039   PROT 7.1 11/02/2016 1039   ALBUMIN 4.0 11/02/2016 1039   AST 21 11/02/2016 1039   ALT 15 11/02/2016 1039   ALKPHOS 66 11/02/2016 1039   BILITOT 0.55 11/02/2016 1039   GFRNONAA >60 09/17/2016 1316   GFRAA >60 09/17/2016 1316        ASSESSMENT and PLAN:   Malignant neoplasm of lower-outer quadrant of left breast of female, estrogen receptor positive ([place]) 07/26/2016: Left lumpectomy: IDC 1.8 cm, with DCIS, margins negative, 0/2 lymph nodes, ER 80%, PR 20%, HER-2 negative ratio 1.18, Ki-67 60%, T1cN0 stage IA  Oncotype DX score 51: 10 year risk of recurrence greater than 34%, extremely high risk  Recommendation: 1. Adjuvant chemotherapy with Taxotere and Cytoxan every 3 weeks 4 cycles 2. Adjuvant radiation therapy followed by 3. Adjuvant antiestrogen therapy --------------------------------------------------------------------------------------------------------------------------------  [name] will proceed with chemotherapy today.  I reviewed with her the chemotherapy regimen, common adverse effects, her anti-emetics, and other tips.  She will receive onpro and I have instructed her to take claritin starting the day after treatment.  Her labs are normal today, I reviewed them with her in detail and gave her a copy of the lab results.    Salah will return in one week for labs and evaluation.        All questions were answered. The patient knows to call the clinic with any problems, questions or concerns. We can certainly see the patient much sooner if necessary.  A total of (30) minutes of face-to-face time was spent with this patient with greater than 50% of that time in counseling  and care-coordination.  This note was electronically signed. Scot Dock, NP 11/03/2016

## 2016-11-02 ENCOUNTER — Ambulatory Visit: Payer: Medicare Other

## 2016-11-02 ENCOUNTER — Other Ambulatory Visit (HOSPITAL_BASED_OUTPATIENT_CLINIC_OR_DEPARTMENT_OTHER): Payer: Medicare Other

## 2016-11-02 ENCOUNTER — Ambulatory Visit (HOSPITAL_BASED_OUTPATIENT_CLINIC_OR_DEPARTMENT_OTHER): Payer: Medicare Other | Admitting: Adult Health

## 2016-11-02 ENCOUNTER — Encounter: Payer: Self-pay | Admitting: *Deleted

## 2016-11-02 ENCOUNTER — Ambulatory Visit (HOSPITAL_BASED_OUTPATIENT_CLINIC_OR_DEPARTMENT_OTHER): Payer: Medicare Other

## 2016-11-02 VITALS — BP 113/48 | HR 64 | Temp 98.4°F | Resp 18 | Ht 61.0 in | Wt 197.4 lb

## 2016-11-02 DIAGNOSIS — Z170 Estrogen receptor positive status [ER+]: Secondary | ICD-10-CM

## 2016-11-02 DIAGNOSIS — Z5189 Encounter for other specified aftercare: Secondary | ICD-10-CM | POA: Diagnosis not present

## 2016-11-02 DIAGNOSIS — Z5111 Encounter for antineoplastic chemotherapy: Secondary | ICD-10-CM | POA: Diagnosis not present

## 2016-11-02 DIAGNOSIS — C50512 Malignant neoplasm of lower-outer quadrant of left female breast: Secondary | ICD-10-CM

## 2016-11-02 DIAGNOSIS — Z95828 Presence of other vascular implants and grafts: Secondary | ICD-10-CM

## 2016-11-02 LAB — COMPREHENSIVE METABOLIC PANEL
ALT: 15 U/L (ref 0–55)
AST: 21 U/L (ref 5–34)
Albumin: 4 g/dL (ref 3.5–5.0)
Alkaline Phosphatase: 66 U/L (ref 40–150)
Anion Gap: 9 mEq/L (ref 3–11)
BUN: 18.5 mg/dL (ref 7.0–26.0)
CO2: 26 mEq/L (ref 22–29)
Calcium: 9.5 mg/dL (ref 8.4–10.4)
Chloride: 107 mEq/L (ref 98–109)
Creatinine: 0.8 mg/dL (ref 0.6–1.1)
EGFR: >90 mL/min/{1.73_m2} (ref >90)
Glucose: 75 mg/dl (ref 70–140)
Potassium: 4.2 mEq/L (ref 3.5–5.1)
Sodium: 141 mEq/L (ref 136–145)
Total Bilirubin: 0.55 mg/dL (ref 0.20–1.20)
Total Protein: 7.1 g/dL (ref 6.4–8.3)

## 2016-11-02 LAB — CBC WITH DIFFERENTIAL/PLATELET
BASO%: 1.1 % (ref 0.0–2.0)
Basophils Absolute: 0.1 10*3/uL (ref 0.0–0.1)
EOS%: 1.7 % (ref 0.0–7.0)
Eosinophils Absolute: 0.1 10*3/uL (ref 0.0–0.5)
HCT: 35.3 % (ref 34.8–46.6)
HGB: 11.7 g/dL (ref 11.6–15.9)
LYMPH%: 35.5 % (ref 14.0–49.7)
MCH: 31.3 pg (ref 25.1–34.0)
MCHC: 33.1 g/dL (ref 31.5–36.0)
MCV: 94.5 fL (ref 79.5–101.0)
MONO#: 0.5 10*3/uL (ref 0.1–0.9)
MONO%: 10.3 % (ref 0.0–14.0)
NEUT#: 2.5 10*3/uL (ref 1.5–6.5)
NEUT%: 51.4 % (ref 38.4–76.8)
Platelets: 191 10*3/uL (ref 145–400)
RBC: 3.73 10*6/uL (ref 3.70–5.45)
RDW: 14.3 % (ref 11.2–14.5)
WBC: 4.8 10*3/uL (ref 3.9–10.3)
lymph#: 1.7 10*3/uL (ref 0.9–3.3)

## 2016-11-02 MED ORDER — SODIUM CHLORIDE 0.9 % IV SOLN
600.0000 mg/m2 | Freq: Once | INTRAVENOUS | Status: AC
  Administered 2016-11-02: 1200 mg via INTRAVENOUS
  Filled 2016-11-02: qty 60

## 2016-11-02 MED ORDER — SODIUM CHLORIDE 0.9% FLUSH
10.0000 mL | Freq: Once | INTRAVENOUS | Status: AC
  Administered 2016-11-02: 10 mL
  Filled 2016-11-02: qty 10

## 2016-11-02 MED ORDER — PALONOSETRON HCL INJECTION 0.25 MG/5ML
INTRAVENOUS | Status: AC
  Filled 2016-11-02: qty 5

## 2016-11-02 MED ORDER — SODIUM CHLORIDE 0.9% FLUSH
10.0000 mL | INTRAVENOUS | Status: DC | PRN
Start: 2016-11-02 — End: 2016-11-02
  Administered 2016-11-02: 10 mL
  Filled 2016-11-02: qty 10

## 2016-11-02 MED ORDER — HEPARIN SOD (PORK) LOCK FLUSH 100 UNIT/ML IV SOLN
500.0000 [IU] | Freq: Once | INTRAVENOUS | Status: AC | PRN
  Administered 2016-11-02: 500 [IU]
  Filled 2016-11-02: qty 5

## 2016-11-02 MED ORDER — PEGFILGRASTIM 6 MG/0.6ML ~~LOC~~ PSKT
6.0000 mg | PREFILLED_SYRINGE | Freq: Once | SUBCUTANEOUS | Status: AC
  Administered 2016-11-02: 6 mg via SUBCUTANEOUS
  Filled 2016-11-02: qty 0.6

## 2016-11-02 MED ORDER — PALONOSETRON HCL INJECTION 0.25 MG/5ML
0.2500 mg | Freq: Once | INTRAVENOUS | Status: AC
  Administered 2016-11-02: 0.25 mg via INTRAVENOUS

## 2016-11-02 MED ORDER — SODIUM CHLORIDE 0.9 % IV SOLN
Freq: Once | INTRAVENOUS | Status: AC
  Administered 2016-11-02: 12:00:00 via INTRAVENOUS
  Filled 2016-11-02: qty 5

## 2016-11-02 MED ORDER — DOXORUBICIN HCL CHEMO IV INJECTION 2 MG/ML
60.0000 mg/m2 | Freq: Once | INTRAVENOUS | Status: AC
  Administered 2016-11-02: 120 mg via INTRAVENOUS
  Filled 2016-11-02: qty 60

## 2016-11-02 MED ORDER — SODIUM CHLORIDE 0.9 % IV SOLN
Freq: Once | INTRAVENOUS | Status: AC
  Administered 2016-11-02: 12:00:00 via INTRAVENOUS

## 2016-11-02 NOTE — Patient Instructions (Signed)
Cyclophosphamide injection What is this medicine? CYCLOPHOSPHAMIDE (sye kloe FOSS fa mide) is a chemotherapy drug. It slows the growth of cancer cells. This medicine is used to treat many types of cancer like lymphoma, myeloma, leukemia, breast cancer, and ovarian cancer, to name a few. This medicine may be used for other purposes; ask your health care provider or pharmacist if you have questions. COMMON BRAND NAME(S): Cytoxan, Neosar What should I tell my health care provider before I take this medicine? They need to know if you have any of these conditions: -blood disorders -history of other chemotherapy -infection -kidney disease -liver disease -recent or ongoing radiation therapy -tumors in the bone marrow -an unusual or allergic reaction to cyclophosphamide, other chemotherapy, other medicines, foods, dyes, or preservatives -pregnant or trying to get pregnant -breast-feeding How should I use this medicine? This drug is usually given as an injection into a vein or muscle or by infusion into a vein. It is administered in a hospital or clinic by a specially trained health care professional. Talk to your pediatrician regarding the use of this medicine in children. Special care may be needed. Overdosage: If you think you have taken too much of this medicine contact a poison control center or emergency room at once. NOTE: This medicine is only for you. Do not share this medicine with others. What if I miss a dose? It is important not to miss your dose. Call your doctor or health care professional if you are unable to keep an appointment. What may interact with this medicine? This medicine may interact with the following medications: -amiodarone -amphotericin B -azathioprine -certain antiviral medicines for HIV or AIDS such as protease inhibitors (e.g., indinavir, ritonavir) and zidovudine -certain blood pressure medications such as benazepril, captopril, enalapril, fosinopril,  lisinopril, moexipril, monopril, perindopril, quinapril, ramipril, trandolapril -certain cancer medications such as anthracyclines (e.g., daunorubicin, doxorubicin), busulfan, cytarabine, paclitaxel, pentostatin, tamoxifen, trastuzumab -certain diuretics such as chlorothiazide, chlorthalidone, hydrochlorothiazide, indapamide, metolazone -certain medicines that treat or prevent blood clots like warfarin -certain muscle relaxants such as succinylcholine -cyclosporine -etanercept -indomethacin -medicines to increase blood counts like filgrastim, pegfilgrastim, sargramostim -medicines used as general anesthesia -metronidazole -natalizumab This list may not describe all possible interactions. Give your health care provider a list of all the medicines, herbs, non-prescription drugs, or dietary supplements you use. Also tell them if you smoke, drink alcohol, or use illegal drugs. Some items may interact with your medicine. What should I watch for while using this medicine? Visit your doctor for checks on your progress. This drug may make you feel generally unwell. This is not uncommon, as chemotherapy can affect healthy cells as well as cancer cells. Report any side effects. Continue your course of treatment even though you feel ill unless your doctor tells you to stop. Drink water or other fluids as directed. Urinate often, even at night. In some cases, you may be given additional medicines to help with side effects. Follow all directions for their use. Call your doctor or health care professional for advice if you get a fever, chills or sore throat, or other symptoms of a cold or flu. Do not treat yourself. This drug decreases your body's ability to fight infections. Try to avoid being around people who are sick. This medicine may increase your risk to bruise or bleed. Call your doctor or health care professional if you notice any unusual bleeding. Be careful brushing and flossing your teeth or using a  toothpick because you may get an infection or bleed   more easily. If you have any dental work done, tell your dentist you are receiving this medicine. You may get drowsy or dizzy. Do not drive, use machinery, or do anything that needs mental alertness until you know how this medicine affects you. Do not become pregnant while taking this medicine or for 1 year after stopping it. Women should inform their doctor if they wish to become pregnant or think they might be pregnant. Men should not father a child while taking this medicine and for 4 months after stopping it. There is a potential for serious side effects to an unborn child. Talk to your health care professional or pharmacist for more information. Do not breast-feed an infant while taking this medicine. This medicine may interfere with the ability to have a child. This medicine has caused ovarian failure in some women. This medicine has caused reduced sperm counts in some men. You should talk with your doctor or health care professional if you are concerned about your fertility. If you are going to have surgery, tell your doctor or health care professional that you have taken this medicine. What side effects may I notice from receiving this medicine? Side effects that you should report to your doctor or health care professional as soon as possible: -allergic reactions like skin rash, itching or hives, swelling of the face, lips, or tongue -low blood counts - this medicine may decrease the number of white blood cells, red blood cells and platelets. You may be at increased risk for infections and bleeding. -signs of infection - fever or chills, cough, sore throat, pain or difficulty passing urine -signs of decreased platelets or bleeding - bruising, pinpoint red spots on the skin, black, tarry stools, blood in the urine -signs of decreased red blood cells - unusually weak or tired, fainting spells, lightheadedness -breathing problems -dark  urine -dizziness -palpitations -swelling of the ankles, feet, hands -trouble passing urine or change in the amount of urine -weight gain -yellowing of the eyes or skin Side effects that usually do not require medical attention (report to your doctor or health care professional if they continue or are bothersome): -changes in nail or skin color -hair loss -missed menstrual periods -mouth sores -nausea, vomiting This list may not describe all possible side effects. Call your doctor for medical advice about side effects. You may report side effects to FDA at 1-800-FDA-1088. Where should I keep my medicine? This drug is given in a hospital or clinic and will not be stored at home. NOTE: This sheet is a summary. It may not cover all possible information. If you have questions about this medicine, talk to your doctor, pharmacist, or health care provider.  2018 [employer] Standard (2012-03-21 16:22:58) Doxorubicin injection What is this medicine? DOXORUBICIN (dox oh ROO bi sin) is a chemotherapy drug. It is used to treat many kinds of cancer like leukemia, lymphoma, neuroblastoma, sarcoma, and Wilms' tumor. It is also used to treat bladder cancer, breast cancer, lung cancer, ovarian cancer, stomach cancer, and thyroid cancer. This medicine may be used for other purposes; ask your health care provider or pharmacist if you have questions. COMMON BRAND NAME(S): Adriamycin, Adriamycin PFS, Adriamycin RDF, Rubex What should I tell my health care provider before I take this medicine? They need to know if you have any of these conditions: -heart disease -history of low blood counts caused by a medicine -liver disease -recent or ongoing radiation therapy -an unusual or allergic reaction to doxorubicin, other chemotherapy agents, other medicines, foods,   dyes, or preservatives -pregnant or trying to get pregnant -breast-feeding How should I use this medicine? This drug is given as an infusion  into a vein. It is administered in a hospital or clinic by a specially trained health care professional. If you have pain, swelling, burning or any unusual feeling around the site of your injection, tell your health care professional right away. Talk to your pediatrician regarding the use of this medicine in children. Special care may be needed. Overdosage: If you think you have taken too much of this medicine contact a poison control center or emergency room at once. NOTE: This medicine is only for you. Do not share this medicine with others. What if I miss a dose? It is important not to miss your dose. Call your doctor or health care professional if you are unable to keep an appointment. What may interact with this medicine? This medicine may interact with the following medications: -6-mercaptopurine -paclitaxel -phenytoin -St. John's Wort -trastuzumab -verapamil This list may not describe all possible interactions. Give your health care provider a list of all the medicines, herbs, non-prescription drugs, or dietary supplements you use. Also tell them if you smoke, drink alcohol, or use illegal drugs. Some items may interact with your medicine. What should I watch for while using this medicine? This drug may make you feel generally unwell. This is not uncommon, as chemotherapy can affect healthy cells as well as cancer cells. Report any side effects. Continue your course of treatment even though you feel ill unless your doctor tells you to stop. There is a maximum amount of this medicine you should receive throughout your life. The amount depends on the medical condition being treated and your overall health. Your doctor will watch how much of this medicine you receive in your lifetime. Tell your doctor if you have taken this medicine before. You may need blood work done while you are taking this medicine. Your urine may turn red for a few days after your dose. This is not blood. If your urine  is dark or brown, call your doctor. In some cases, you may be given additional medicines to help with side effects. Follow all directions for their use. Call your doctor or health care professional for advice if you get a fever, chills or sore throat, or other symptoms of a cold or flu. Do not treat yourself. This drug decreases your body's ability to fight infections. Try to avoid being around people who are sick. This medicine may increase your risk to bruise or bleed. Call your doctor or health care professional if you notice any unusual bleeding. Talk to your doctor about your risk of cancer. You may be more at risk for certain types of cancers if you take this medicine. Do not become pregnant while taking this medicine or for 6 months after stopping it. Women should inform their doctor if they wish to become pregnant or think they might be pregnant. Men should not father a child while taking this medicine and for 6 months after stopping it. There is a potential for serious side effects to an unborn child. Talk to your health care professional or pharmacist for more information. Do not breast-feed an infant while taking this medicine. This medicine has caused ovarian failure in some women and reduced sperm counts in some men This medicine may interfere with the ability to have a child. Talk with your doctor or health care professional if you are concerned about your fertility. What side effects   may I notice from receiving this medicine? Side effects that you should report to your doctor or health care professional as soon as possible: -allergic reactions like skin rash, itching or hives, swelling of the face, lips, or tongue -breathing problems -chest pain -fast or irregular heartbeat -low blood counts - this medicine may decrease the number of white blood cells, red blood cells and platelets. You may be at increased risk for infections and bleeding. -pain, redness, or irritation at site where  injected -signs of infection - fever or chills, cough, sore throat, pain or difficulty passing urine -signs of decreased platelets or bleeding - bruising, pinpoint red spots on the skin, black, tarry stools, blood in the urine -swelling of the ankles, feet, hands -tiredness -weakness Side effects that usually do not require medical attention (report to your doctor or health care professional if they continue or are bothersome): -diarrhea -hair loss -mouth sores -nail discoloration or damage -nausea -red colored urine -vomiting This list may not describe all possible side effects. Call your doctor for medical advice about side effects. You may report side effects to FDA at 1-800-FDA-1088. Where should I keep my medicine? This drug is given in a hospital or clinic and will not be stored at home. NOTE: This sheet is a summary. It may not cover all possible information. If you have questions about this medicine, talk to your doctor, pharmacist, or health care provider.  2018 [employer]/Gold Standard (2015-07-04 11:28:51) Pegfilgrastim injection What is this medicine? PEGFILGRASTIM (PEG fil gra stim) is a long-acting granulocyte colony-stimulating factor that stimulates the growth of neutrophils, a type of white blood cell important in the body's fight against infection. It is used to reduce the incidence of fever and infection in patients with certain types of cancer who are receiving chemotherapy that affects the bone marrow, and to increase survival after being exposed to high doses of radiation. This medicine may be used for other purposes; ask your health care provider or pharmacist if you have questions. COMMON BRAND NAME(S): Neulasta What should I tell my health care provider before I take this medicine? They need to know if you have any of these conditions: -kidney disease -latex allergy -ongoing radiation therapy -sickle cell disease -skin reactions to acrylic adhesives (On-Body  Injector only) -an unusual or allergic reaction to pegfilgrastim, filgrastim, other medicines, foods, dyes, or preservatives -pregnant or trying to get pregnant -breast-feeding How should I use this medicine? This medicine is for injection under the skin. If you get this medicine at home, you will be taught how to prepare and give the pre-filled syringe or how to use the On-body Injector. Refer to the patient Instructions for Use for detailed instructions. Use exactly as directed. Tell your healthcare provider immediately if you suspect that the On-body Injector may not have performed as intended or if you suspect the use of the On-body Injector resulted in a missed or partial dose. It is important that you put your used needles and syringes in a special sharps container. Do not put them in a trash can. If you do not have a sharps container, call your pharmacist or healthcare provider to get one. Talk to your pediatrician regarding the use of this medicine in children. While this drug may be prescribed for selected conditions, precautions do apply. Overdosage: If you think you have taken too much of this medicine contact a poison control center or emergency room at once. NOTE: This medicine is only for you. Do not share this medicine  with others. What if I miss a dose? It is important not to miss your dose. Call your doctor or health care professional if you miss your dose. If you miss a dose due to an On-body Injector failure or leakage, a new dose should be administered as soon as possible using a single prefilled syringe for manual use. What may interact with this medicine? Interactions have not been studied. Give your health care provider a list of all the medicines, herbs, non-prescription drugs, or dietary supplements you use. Also tell them if you smoke, drink alcohol, or use illegal drugs. Some items may interact with your medicine. This list may not describe all possible interactions. Give  your health care provider a list of all the medicines, herbs, non-prescription drugs, or dietary supplements you use. Also tell them if you smoke, drink alcohol, or use illegal drugs. Some items may interact with your medicine. What should I watch for while using this medicine? You may need blood work done while you are taking this medicine. If you are going to need a MRI, CT scan, or other procedure, tell your doctor that you are using this medicine (On-Body Injector only). What side effects may I notice from receiving this medicine? Side effects that you should report to your doctor or health care professional as soon as possible: -allergic reactions like skin rash, itching or hives, swelling of the face, lips, or tongue -dizziness -fever -pain, redness, or irritation at site where injected -pinpoint red spots on the skin -red or dark-brown urine -shortness of breath or breathing problems -stomach or side pain, or pain at the shoulder -swelling -tiredness -trouble passing urine or change in the amount of urine Side effects that usually do not require medical attention (report to your doctor or health care professional if they continue or are bothersome): -bone pain -muscle pain This list may not describe all possible side effects. Call your doctor for medical advice about side effects. You may report side effects to FDA at 1-800-FDA-1088. Where should I keep my medicine? Keep out of the reach of children. Store pre-filled syringes in a refrigerator between 2 and 8 degrees C (36 and 46 degrees F). Do not freeze. Keep in carton to protect from light. Throw away this medicine if it is left out of the refrigerator for more than 48 hours. Throw away any unused medicine after the expiration date. NOTE: This sheet is a summary. It may not cover all possible information. If you have questions about this medicine, talk to your doctor, pharmacist, or health care provider.  2018 [employer]/Gold  Standard (2016-05-03 12:58:03)

## 2016-11-03 ENCOUNTER — Encounter: Payer: Self-pay | Admitting: Adult Health

## 2016-11-08 NOTE — Assessment & Plan Note (Addendum)
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 Adjuvant chemotherapy with Adriamycin/Cytoxan x 4, then weekly Taxol x 12 2. Adjuvant radiation therapy followed by 3. Adjuvant antiestrogen therapy -------------------------------------------------------------------------------------------------------------------------------- [name] is doing well today.  She is dehydrated from treatment.  She is slightly neutropenic and precautions were reviewed with her in detail.  She will receive 1 liter of IV fluids today.  She will return in one week for labs and evaluation prior to cycle 2 of chemotherapy.

## 2016-11-08 NOTE — Progress Notes (Signed)
[facility] Follow up:    Alicia Frees, MD [address] [place] [address]   DIAGNOSIS: Cancer Staging Malignant neoplasm of lower-outer quadrant of left breast of female, estrogen receptor positive ([place]) Staging form: Breast, AJCC 8th Edition - Clinical: Stage IA (cT1c, cN0(sn), cM0, G3, ER: Positive, PR: Positive, HER2: Negative, Oncotype DX score: 51) - Unsigned   SUMMARY OF ONCOLOGIC HISTORY:   Malignant neoplasm of lower-outer quadrant of left breast of female, estrogen receptor positive ([place])   06/26/2016 Initial Diagnosis    Left breast biopsy 3:00: IDC with DCIS, grade 3, ER 80%, PR 20%, Ki-67 60%, HER-2 negative ratio 1.18; palpable lump: 1.8 cm lesion, no lymph nodes, T1 cN0 stage I a clinical stage      07/26/2016 Surgery    Left lumpectomy: IDC 1.8 cm, with DCIS, margins negative, 0/2 lymph nodes, ER 80%, PR 20%, HER-2 negative ratio 1.18, Ki-67 60%, T1cN0 stage IA       08/21/2016 Oncotype testing    Oncotype DX recurrence score 51, risk of recurrence 34%      08/21/2016 Genetic Testing    Genetic counseling and testing for hereditary cancer syndromes performed on 08/21/2016. Results are negative for pathogenic mutations in 46 genes analyzed by [employer]'s Common Hereditary Cancers Panel. Results are dated 09/14/2016. Genes tested: APC, ATM, AXIN2, BARD1, BMPR1A, BRCA1, BRCA2, BRIP1, CDH1, CDKN2A, CHEK2, CTNNA1, DICER1, EPCAM, GREM1, HOXB13, KIT, MEN1, MLH1, MSH2, MSH3, MSH6, MUTYH, NBN, NF1, NTHL1, PALB2, PDGFRA, PMS2, POLD1, POLE, PTEN, RAD50, RAD51C, RAD51D, SDHA, SDHB, SDHC, SDHD, SMAD4, SMARCA4, STK11, TP53, TSC1, TSC2, and VHL.         CURRENT THERAPY: Adriamycin/Cytoxan cycle 1 day 8  INTERVAL HISTORY: [name] 56 y.o. female returns for evaluation after receiving her first cycle of AC last week.  She is doing well today.  She has been having flash headaches since receiving chemotherapy.  She has had mild nausea as  well.  She has had decreased PO intake.  She does feel slightly dizzy or swimmy headed with position changes.  She says her legs sometimes may turn to jello.     Patient Active Problem List   Diagnosis Date Noted  . Port catheter in place 11/02/2016  . Genetic testing 09/19/2016  . Pre-operative clearance 07/23/2016  . Malignant neoplasm of lower-outer quadrant of left breast of female, estrogen receptor positive ([place]) 07/10/2016  . Essential hypertension   . Chest pain 09/30/2015  . DVT, lower extremity ([place]) 06/18/2011  . DVT of upper extremity (deep vein thrombosis) ([place]) 06/18/2011  . Greenfield filter in place 06/18/2011  . History of stroke 06/18/2011  . PFO (patent foramen ovale) 06/18/2011  . Status post gastric bypass for obesity 06/18/2011  . PELVIC PAIN, CHRONIC 07/20/2008  . FIBROIDS, UTERUS 07/16/2008  . ASTHMA 07/16/2008  . FIBROMYALGIA 07/16/2008  . CARPAL TUNNEL SYNDROME, HX OF 07/16/2008  . History of pulmonary embolus (PE) 07/16/2008    is allergic to aspirin; oxycodone hcl; propoxyphene n-acetaminophen; tramadol; adhesive [tape]; and prednisone.  MEDICAL HISTORY: Past Medical History:  Diagnosis Date  . Anemia    since bypass  . Arthritis    osteoarthritis  . Asthma    states no asthma attack since 2002  . Breast cancer ([place]) 06/26/16 bx   left breast  . Chronic back pain   . Complication of anesthesia    states takes more than normal to put her to sleep  . Dental bridge present  upper  . Dental crowns present   . DVT of upper extremity (deep vein thrombosis) ([facility])   . Fibromyalgia   . Genetic testing 09/19/2016   [name] underwent genetic counseling and testing for hereditary cancer syndromes on 08/21/2016. Her results were negative for mutations in all 46 genes analyzed by [employer]'s 46-gene Common Hereditary Cancers Panel. Genes analyzed include: APC, ATM, AXIN2, BARD1, BMPR1A, BRCA1, BRCA2, BRIP1, CDH1, CDKN2A, CHEK2, CTNNA1, DICER1, EPCAM, GREM1,  HOXB13, KIT, MEN1, MLH1, MSH2, MSH3, MSH6, MUTYH, NBN,   . Headache(784.0)    migraines  . History of blood transfusion 06/2005  . History of gallstones   . History of pneumonia   . History of shingles 07/2011  . HTN (hypertension)   . Normal coronary arteries 2003  . PFO (patent foramen ovale)   . Presence of inferior vena cava filter   . Pulmonary embolism ([facility])   . Sleep apnea    used CPAP until after bypass surg.  . Status post gastric bypass for obesity   . Stroke [facility]) 12/2002   right-sided weakness  . Urinary incontinence     SURGICAL HISTORY: Past Surgical History:  Procedure Laterality Date  . ABDOMINAL HYSTERECTOMY  11/1997   complete  . ANTERIOR CERVICAL DECOMP/DISCECTOMY FUSION  02/05/2005   C5-6  . APPENDECTOMY  10/22/2008   laparoscopic  . BREAST LUMPECTOMY WITH RADIOACTIVE SEED AND SENTINEL LYMPH NODE BIOPSY Left 07/26/2016   Procedure: BREAST LUMPECTOMY WITH RADIOACTIVE SEED AND SENTINEL LYMPH NODE BIOPSY;  Surgeon: Harriette Bouillon, MD;  Location: [facility];  Service: General;  Laterality: Left;  . BUNIONECTOMY  05/1980   both feet  . BUNIONECTOMY  08/2011   left foot  . CARDIAC CATHETERIZATION  03/04/2002  . CARPAL TUNNEL RELEASE  06/21/2009   right  . CARPAL TUNNEL RELEASE     left hand  . CARPAL TUNNEL RELEASE  10/03/2011   Procedure: CARPAL TUNNEL RELEASE;  Surgeon: Nicki Reaper, MD;  Location: [facility];  Service: Orthopedics;  Laterality: Right;  CARPAL TUNNEL WITH HYPOTHENAR FAT PAD TRANSFER  . CERVICAL SPINE SURGERY  01/2005   titanium plate implanted  . CHOLECYSTECTOMY  1990  . ELBOW SURGERY  08/09/2004   decompression ulnar nerve right elbow  . ENTEROLYSIS  10/22/2008   laparoscopic abd. enterolysis  . GASTRIC ROUX-EN-Y  2009  . HEEL SPUR SURGERY  08/1997   left  . HEMORRHOID SURGERY  03/1993  . LAPAROSCOPIC LYSIS INTESTINAL ADHESIONS  02/14/2000  . NAILBED REPAIR  01/10/2005; 08/2011   exc. matrix bilat. great toe  . OTHER  SURGICAL HISTORY  12/1986   pt states that she had surgery to unclog her fallopean tubes  . PORTACATH PLACEMENT N/A 09/24/2016   Procedure: INSERTION PORT-A-CATH WITH [name];  Surgeon: Harriette Bouillon, MD;  Location: [facility];  Service: General;  Laterality: N/A;  . SHOULDER SURGERY     bilat. - (left:  06/2005)  . TONSILLECTOMY  07/1995  . TRIGGER FINGER RELEASE  04/25/2006   decompression A-1 pulley left thumb  . UTERINE FIBROID SURGERY  12/95, 7/96   x2  . VENA CAVA FILTER PLACEMENT  2009   during Roux-en-Y surg.    SOCIAL HISTORY: Social History   Social History  . Marital status: Single    Spouse name: N/A  . Number of children: 0  . Years of education: N/A   Occupational History  . disabled    Social History Main Topics  . Smoking status: Former  Smoker  . Smokeless tobacco: Never Used     Comment: quit smoking 08/1989  . Alcohol use No  . Drug use: No  . Sexual activity: No   Other Topics Concern  . Not on file   Social History Narrative  . No narrative on file    FAMILY HISTORY: Family History  Problem Relation Age of Onset  . Breast cancer Paternal Aunt 40  . Cervical cancer Paternal Grandmother 40       d.40s  . Ovarian cancer Maternal Grandmother 23       d.23  . Colon polyps Father   . Diabetes Father        borderline  . Prostate cancer Father   . Hypertension Mother   . Hypertension Unknown   . Breast cancer Sister 23       treated with neoadjuvant chemo/radiation and lumpectomy    Review of Systems  Constitutional: Positive for fatigue. Negative for appetite change, chills, fever and unexpected weight change.  HENT:   Negative for hearing loss and lump/mass.   Eyes: Negative for eye problems and icterus.  Respiratory: Negative for chest tightness, cough and shortness of breath.   Cardiovascular: Negative for chest pain, leg swelling and palpitations.  Gastrointestinal: Positive for nausea. Negative for abdominal distention, abdominal pain,  constipation, diarrhea and vomiting.  Endocrine: Negative for hot flashes.  Neurological: Positive for dizziness and headaches. Negative for extremity weakness and numbness.  Hematological: Negative for adenopathy. Does not bruise/bleed easily.  Psychiatric/Behavioral: Negative for depression. The patient is not nervous/anxious.       PHYSICAL EXAMINATION  ECOG PERFORMANCE STATUS: 2 - Symptomatic, <50% confined to bed  Vitals:   11/09/16 0836  BP: 114/65  Pulse: 68  Resp: 18  Temp: 98.6 F (37 C)    Physical Exam  Constitutional: She is oriented to person, place, and time and well-developed, well-nourished, and in no distress.  HENT:  Head: Normocephalic and atraumatic.  Mouth/Throat: Oropharynx is clear and moist. No oropharyngeal exudate.  Eyes: Pupils are equal, round, and reactive to light. No scleral icterus.  Neck: Neck supple.  Cardiovascular: Normal rate, regular rhythm and normal heart sounds.   Pulmonary/Chest: Effort normal and breath sounds normal. No respiratory distress. She has no wheezes. She has no rales.  Abdominal: Soft. Bowel sounds are normal. She exhibits no distension. There is no tenderness.  Musculoskeletal: She exhibits no edema.  Lymphadenopathy:    She has no cervical adenopathy.  Neurological: She is alert and oriented to person, place, and time.  Skin: Skin is warm and dry. No rash noted.  Psychiatric: Mood and affect normal.    LABORATORY DATA:  CBC    Component Value Date/Time   WBC 1.4 (L) 11/09/2016 0821   WBC 4.6 09/17/2016 1316   RBC 3.76 11/09/2016 0821   RBC 3.76 (L) 09/17/2016 1316   HGB 11.7 11/09/2016 0821   HCT 35.2 11/09/2016 0821   PLT 117 (L) 11/09/2016 0821   MCV 93.6 11/09/2016 0821   MCH 31.1 11/09/2016 0821   MCH 31.4 09/17/2016 1316   MCHC 33.2 11/09/2016 0821   MCHC 33.1 09/17/2016 1316   RDW 13.6 11/09/2016 0821   LYMPHSABS 0.6 (L) 11/09/2016 0821   MONOABS 0.1 11/09/2016 0821   EOSABS 0.1 11/09/2016 0821    BASOSABS 0.0 11/09/2016 0821    CMP     Component Value Date/Time   NA 134 (L) 11/09/2016 0822   K 4.4 11/09/2016 0822   CL 105  09/17/2016 1316   CO2 26 11/09/2016 0822   GLUCOSE 99 11/09/2016 0822   BUN 16.3 11/09/2016 0822   CREATININE 0.7 11/09/2016 0822   CALCIUM 9.5 11/09/2016 0822   PROT 7.0 11/09/2016 0822   ALBUMIN 3.9 11/09/2016 0822   AST 13 11/09/2016 0822   ALT 14 11/09/2016 0822   ALKPHOS 80 11/09/2016 0822   BILITOT 0.79 11/09/2016 0822   GFRNONAA >60 09/17/2016 1316   GFRAA >60 09/17/2016 1316         ASSESSMENT and PLAN:   Malignant neoplasm of lower-outer quadrant of left breast of female, estrogen receptor positive ([place]) 07/26/2016: Left lumpectomy: IDC 1.8 cm, with DCIS, margins negative, 0/2 lymph nodes, ER 80%, PR 20%, HER-2 negative ratio 1.18, Ki-67 60%, T1cN0 stage IA  Oncotype DX score 51: 10 year risk of recurrence greater than 34%, extremely high risk  Recommendation: 1. Adjuvant chemotherapy with Adriamycin/Cytoxan x 4, then weekly Taxol x 12 2. Adjuvant radiation therapy followed by 3. Adjuvant antiestrogen therapy -------------------------------------------------------------------------------------------------------------------------------- [name] is doing well today.  She is dehydrated from treatment.  She is slightly neutropenic and precautions were reviewed with her in detail.  She will receive 1 liter of IV fluids today.  She will return in one week for labs and evaluation prior to cycle 2 of chemotherapy.     All questions were answered. The patient knows to call the clinic with any problems, questions or concerns. We can certainly see the patient much sooner if necessary.  A total of (30) minutes of face-to-face time was spent with this patient with greater than 50% of that time in counseling and care-coordination.  This note was electronically signed. Scot Dock, NP 11/09/2016

## 2016-11-09 ENCOUNTER — Other Ambulatory Visit (HOSPITAL_BASED_OUTPATIENT_CLINIC_OR_DEPARTMENT_OTHER): Payer: Medicare Other

## 2016-11-09 ENCOUNTER — Ambulatory Visit (HOSPITAL_BASED_OUTPATIENT_CLINIC_OR_DEPARTMENT_OTHER): Payer: Medicare Other | Admitting: Adult Health

## 2016-11-09 ENCOUNTER — Ambulatory Visit (HOSPITAL_COMMUNITY)
Admission: RE | Admit: 2016-11-09 | Discharge: 2016-11-09 | Disposition: A | Discharge status: Home / Self Care | Payer: Medicare Other | Source: Ambulatory Visit | Attending: Family Medicine | Admitting: Family Medicine

## 2016-11-09 ENCOUNTER — Encounter: Payer: Self-pay | Admitting: Adult Health

## 2016-11-09 ENCOUNTER — Other Ambulatory Visit: Payer: Self-pay | Admitting: Emergency Medicine

## 2016-11-09 VITALS — BP 114/65 | HR 68 | Temp 98.6°F | Resp 18 | Ht 61.0 in | Wt 191.6 lb

## 2016-11-09 DIAGNOSIS — D709 Neutropenia, unspecified: Secondary | ICD-10-CM | POA: Diagnosis not present

## 2016-11-09 DIAGNOSIS — C50512 Malignant neoplasm of lower-outer quadrant of left female breast: Secondary | ICD-10-CM | POA: Diagnosis not present

## 2016-11-09 DIAGNOSIS — E860 Dehydration: Secondary | ICD-10-CM

## 2016-11-09 DIAGNOSIS — Z170 Estrogen receptor positive status [ER+]: Secondary | ICD-10-CM | POA: Diagnosis not present

## 2016-11-09 LAB — COMPREHENSIVE METABOLIC PANEL
ALT: 14 U/L (ref 0–55)
AST: 13 U/L (ref 5–34)
Albumin: 3.9 g/dL (ref 3.5–5.0)
Alkaline Phosphatase: 80 U/L (ref 40–150)
Anion Gap: 9 mEq/L (ref 3–11)
BUN: 16.3 mg/dL (ref 7.0–26.0)
CO2: 26 mEq/L (ref 22–29)
Calcium: 9.5 mg/dL (ref 8.4–10.4)
Chloride: 99 mEq/L (ref 98–109)
Creatinine: 0.7 mg/dL (ref 0.6–1.1)
EGFR: >90 mL/min/{1.73_m2} (ref >90)
Glucose: 99 mg/dl (ref 70–140)
Potassium: 4.4 mEq/L (ref 3.5–5.1)
Sodium: 134 mEq/L — ABNORMAL LOW (ref 136–145)
Total Bilirubin: 0.79 mg/dL (ref 0.20–1.20)
Total Protein: 7 g/dL (ref 6.4–8.3)

## 2016-11-09 LAB — CBC WITH DIFFERENTIAL/PLATELET
BASO%: 2.1 % — ABNORMAL HIGH (ref 0.0–2.0)
Basophils Absolute: 0 10*3/uL (ref 0.0–0.1)
EOS%: 8.4 % — ABNORMAL HIGH (ref 0.0–7.0)
Eosinophils Absolute: 0.1 10*3/uL (ref 0.0–0.5)
HCT: 35.2 % (ref 34.8–46.6)
HGB: 11.7 g/dL (ref 11.6–15.9)
LYMPH%: 41.3 % (ref 14.0–49.7)
MCH: 31.1 pg (ref 25.1–34.0)
MCHC: 33.2 g/dL (ref 31.5–36.0)
MCV: 93.6 fL (ref 79.5–101.0)
MONO#: 0.1 10*3/uL (ref 0.1–0.9)
MONO%: 8.4 % (ref 0.0–14.0)
NEUT#: 0.6 10*3/uL — ABNORMAL LOW (ref 1.5–6.5)
NEUT%: 39.8 % (ref 38.4–76.8)
Platelets: 117 10*3/uL — ABNORMAL LOW (ref 145–400)
RBC: 3.76 10*6/uL (ref 3.70–5.45)
RDW: 13.6 % (ref 11.2–14.5)
WBC: 1.4 10*3/uL — ABNORMAL LOW (ref 3.9–10.3)
lymph#: 0.6 10*3/uL — ABNORMAL LOW (ref 0.9–3.3)

## 2016-11-09 MED ORDER — SODIUM CHLORIDE 0.9 % IV SOLN: INTRAVENOUS | Status: DC

## 2016-11-09 MED ORDER — SODIUM CHLORIDE 0.9% FLUSH
10.0000 mL | INTRAVENOUS | Status: AC | PRN
  Administered 2016-11-09: 10 mL

## 2016-11-09 MED ORDER — HEPARIN SOD (PORK) LOCK FLUSH 100 UNIT/ML IV SOLN
500.0000 [IU] | INTRAVENOUS | Status: AC | PRN
  Administered 2016-11-09: 500 [IU]
  Filled 2016-11-09: qty 5

## 2016-11-09 MED ORDER — SILVER SULFADIAZINE 1 % EX CREA: 1.0000 "application " | TOPICAL_CREAM | Freq: Two times a day (BID) | CUTANEOUS | 0 refills | Status: DC | PRN

## 2016-11-09 MED ORDER — SODIUM CHLORIDE 0.9 % IV SOLN
INTRAVENOUS | Status: DC
  Administered 2016-11-09: 11:00:00 via INTRAVENOUS

## 2016-11-09 NOTE — Discharge Instructions (Signed)
Today, you received an IV infusion of normal saline.  Please see Dr. Geralyn Flash office for further follow-up.

## 2016-11-09 NOTE — Procedures (Signed)
[facility]  Procedure Note  PATTON SWISHER FOY:[id number] DOB: 08/01/1960 DOA: 11/09/2016   Ordering Provider: Gardenia Phlegm, NP  Associated Diagnosis: Malignant neoplasm of lower-outer quadrant of left breast of female, estrogen receptor positive ([place]) (C50.512 , Z17.0)  Procedure Note: IV infusion of 1 liter NS   Condition During Procedure:  Pt tolerated well; no complications noted   Condition at Discharge: Pt alert, ambulatory   Nigel Sloop, [place]

## 2016-11-09 NOTE — Progress Notes (Signed)
Pt arrived for IV infusion of 1 liter of normal saline; no complications noted

## 2016-11-12 MED FILL — VALACYCLOVIR HCL 500 MG TAB: 500 | 90 days supply | Qty: 180 | Fill #0

## 2016-11-13 MED FILL — SSD 1% CREAM: 1 | 30 days supply | Qty: 400 | Fill #0

## 2016-11-14 ENCOUNTER — Other Ambulatory Visit: Payer: Self-pay | Admitting: *Deleted

## 2016-11-14 MED ORDER — RANITIDINE HCL 300 MG PO CAPS: 300.0000 mg | ORAL_CAPSULE | Freq: Two times a day (BID) | ORAL | 3 refills | Status: DC

## 2016-11-14 MED FILL — SF 5000 PLUS CREAM: 1.1 | 30 days supply | Qty: 51 | Fill #0

## 2016-11-14 MED FILL — raNITIdine HCL 300 MG TABS: 300 | 30 days supply | Qty: 60 | Fill #0

## 2016-11-15 NOTE — Progress Notes (Signed)
[facility] Follow up:    Alicia Frees, MD [address] [address] [place] [address]   DIAGNOSIS: Cancer Staging Malignant neoplasm of lower-outer quadrant of left breast of female, estrogen receptor positive ([place]) Staging form: Breast, AJCC 8th Edition - Clinical: Stage IA (cT1c, cN0(sn), cM0, G3, ER: Positive, PR: Positive, HER2: Negative, Oncotype DX score: 51) - Unsigned   SUMMARY OF ONCOLOGIC HISTORY:   Malignant neoplasm of lower-outer quadrant of left breast of female, estrogen receptor positive ([place])   06/26/2016 Initial Diagnosis    Left breast biopsy 3:00: IDC with DCIS, grade 3, ER 80%, PR 20%, Ki-67 60%, HER-2 negative ratio 1.18; palpable lump: 1.8 cm lesion, no lymph nodes, T1 cN0 stage I a clinical stage      07/26/2016 Surgery    Left lumpectomy: IDC 1.8 cm, with DCIS, margins negative, 0/2 lymph nodes, ER 80%, PR 20%, HER-2 negative ratio 1.18, Ki-67 60%, T1cN0 stage IA       08/21/2016 Oncotype testing    Oncotype DX recurrence score 51, risk of recurrence 34%      08/21/2016 Genetic Testing    Genetic counseling and testing for hereditary cancer syndromes performed on 08/21/2016. Results are negative for pathogenic mutations in 46 genes analyzed by [facility]'s Common Hereditary Cancers Panel. Results are dated 09/14/2016. Genes tested: APC, ATM, AXIN2, BARD1, BMPR1A, BRCA1, BRCA2, BRIP1, CDH1, CDKN2A, CHEK2, CTNNA1, DICER1, EPCAM, GREM1, HOXB13, KIT, MEN1, MLH1, MSH2, MSH3, MSH6, MUTYH, NBN, NF1, NTHL1, PALB2, PDGFRA, PMS2, POLD1, POLE, PTEN, RAD50, RAD51C, RAD51D, SDHA, SDHB, SDHC, SDHD, SMAD4, SMARCA4, STK11, TP53, TSC1, TSC2, and VHL.         CURRENT THERAPY: Doxorubicin/Cyclophosphamide cycle 2 day 1.    INTERVAL HISTORY: [name] 56 y.o. female returns for evaluation prior to receiving her second cycle of chemotherapy.  She is doing moderately well today.  Her blood pressure is slightly low and she occasionally gets dizzy  with position changes.  She is taking Lisinopril 55m daily.  She does not have a cuff at home to check her blood pressure with.  She denies any other issues today.     Patient Active Problem List   Diagnosis Date Noted  . Port catheter in place 11/02/2016  . Genetic testing 09/19/2016  . Pre-operative clearance 07/23/2016  . Malignant neoplasm of lower-outer quadrant of left breast of female, estrogen receptor positive ([place] 07/10/2016  . Essential hypertension   . Chest pain 09/30/2015  . DVT, lower extremity ([place] 06/18/2011  . DVT of upper extremity (deep vein thrombosis) ([place] 06/18/2011  . [place] filter in place 06/18/2011  . History of stroke 06/18/2011  . PFO (patent foramen ovale) 06/18/2011  . Status post gastric bypass for obesity 06/18/2011  . PELVIC PAIN, CHRONIC 07/20/2008  . FIBROIDS, UTERUS 07/16/2008  . ASTHMA 07/16/2008  . FIBROMYALGIA 07/16/2008  . CARPAL TUNNEL SYNDROME, HX OF 07/16/2008  . History of pulmonary embolus (PE) 07/16/2008    is allergic to aspirin; oxycodone hcl; propoxyphene n-acetaminophen; tramadol; adhesive [tape]; and prednisone.  MEDICAL HISTORY: Past Medical History:  Diagnosis Date  . Anemia    since bypass  . Arthritis    osteoarthritis  . Asthma    states no asthma attack since 2002  . Breast cancer ([place] 06/26/16 bx   left breast  . Chronic back pain   . Complication of anesthesia    states takes more than normal to put her to sleep  . Dental bridge present  upper  . Dental crowns present   . DVT of upper extremity (deep vein thrombosis) ([place])   . Fibromyalgia   . Genetic testing 09/19/2016   [name] underwent genetic counseling and testing for hereditary cancer syndromes on 08/21/2016. Her results were negative for mutations in all 46 genes analyzed by [employer]'s 46-gene Common Hereditary Cancers Panel. Genes analyzed include: APC, ATM, AXIN2, BARD1, BMPR1A, BRCA1, BRCA2, BRIP1, CDH1, CDKN2A, CHEK2, CTNNA1, DICER1, EPCAM,  GREM1, HOXB13, KIT, MEN1, MLH1, MSH2, MSH3, MSH6, MUTYH, NBN,   . Headache(784.0)    migraines  . History of blood transfusion 06/2005  . History of gallstones   . History of pneumonia   . History of shingles 07/2011  . HTN (hypertension)   . Normal coronary arteries 2003  . PFO (patent foramen ovale)   . Presence of inferior vena cava filter   . Pulmonary embolism ([place])   . Sleep apnea    used CPAP until after bypass surg.  . Status post gastric bypass for obesity   . Stroke [facility]) 12/2002   right-sided weakness  . Urinary incontinence     SURGICAL HISTORY: Past Surgical History:  Procedure Laterality Date  . ABDOMINAL HYSTERECTOMY  11/1997   complete  . ANTERIOR CERVICAL DECOMP/DISCECTOMY FUSION  02/05/2005   C5-6  . APPENDECTOMY  10/22/2008   laparoscopic  . BREAST LUMPECTOMY WITH RADIOACTIVE SEED AND SENTINEL LYMPH NODE BIOPSY Left 07/26/2016   Procedure: BREAST LUMPECTOMY WITH RADIOACTIVE SEED AND SENTINEL LYMPH NODE BIOPSY;  Surgeon: Erroll Luna, MD;  Location: [facility];  Service: General;  Laterality: Left;  . BUNIONECTOMY  05/1980   both feet  . BUNIONECTOMY  08/2011   left foot  . CARDIAC CATHETERIZATION  03/04/2002  . CARPAL TUNNEL RELEASE  06/21/2009   right  . CARPAL TUNNEL RELEASE     left hand  . CARPAL TUNNEL RELEASE  10/03/2011   Procedure: CARPAL TUNNEL RELEASE;  Surgeon: Wynonia Sours, MD;  Location: [facility];  Service: Orthopedics;  Laterality: Right;  CARPAL TUNNEL WITH HYPOTHENAR FAT PAD TRANSFER  . CERVICAL SPINE SURGERY  01/2005   titanium plate implanted  . CHOLECYSTECTOMY  1990  . ELBOW SURGERY  08/09/2004   decompression ulnar nerve right elbow  . ENTEROLYSIS  10/22/2008   laparoscopic abd. enterolysis  . GASTRIC ROUX-EN-Y  2009  . HEEL SPUR SURGERY  08/1997   left  . HEMORRHOID SURGERY  03/1993  . LAPAROSCOPIC LYSIS INTESTINAL ADHESIONS  02/14/2000  . NAILBED REPAIR  01/10/2005; 08/2011   exc. matrix bilat. great toe  .  OTHER SURGICAL HISTORY  12/1986   pt states that she had surgery to unclog her fallopean tubes  . PORTACATH PLACEMENT N/A 09/24/2016   Procedure: INSERTION PORT-A-CATH WITH [name];  Surgeon: Erroll Luna, MD;  Location: [facility];  Service: General;  Laterality: N/A;  . SHOULDER SURGERY     bilat. - (left:  06/2005)  . TONSILLECTOMY  07/1995  . TRIGGER FINGER RELEASE  04/25/2006   decompression A-1 pulley left thumb  . UTERINE FIBROID SURGERY  12/95, 7/96   x2  . VENA CAVA FILTER PLACEMENT  2009   during Roux-en-Y surg.    SOCIAL HISTORY: Social History   Social History  . Marital status: Single    Spouse name: N/A  . Number of children: 0  . Years of education: N/A   Occupational History  . disabled    Social History Main Topics  . Smoking status: Former  Smoker  . Smokeless tobacco: Never Used     Comment: quit smoking 08/1989  . Alcohol use No  . Drug use: No  . Sexual activity: No   Other Topics Concern  . Not on file   Social History Narrative  . No narrative on file    FAMILY HISTORY: Family History  Problem Relation Age of Onset  . Breast cancer Paternal Aunt 66  . Cervical cancer Paternal Grandmother 9       d.40s  . Ovarian cancer Maternal Grandmother 23       d.23  . Colon polyps Father   . Diabetes Father        borderline  . Prostate cancer Father   . Hypertension Mother   . Hypertension Unknown   . Breast cancer Sister 54       treated with neoadjuvant chemo/radiation and lumpectomy    Review of Systems  Constitutional: Negative for appetite change, chills, fatigue and fever.  HENT:   Negative for hearing loss and lump/mass.   Eyes: Negative for eye problems and icterus.  Respiratory: Negative for chest tightness, cough and shortness of breath.   Cardiovascular: Negative for chest pain, leg swelling and palpitations.  Gastrointestinal: Negative for abdominal distention and abdominal pain.  Endocrine: Negative for hot flashes.  Genitourinary:  Negative for difficulty urinating.   Musculoskeletal: Negative for arthralgias and gait problem.  Skin: Negative for itching and rash.  Neurological: Positive for dizziness. Negative for extremity weakness and gait problem.  Hematological: Does not bruise/bleed easily.  Psychiatric/Behavioral: Negative for depression. The patient is not nervous/anxious.       PHYSICAL EXAMINATION  ECOG PERFORMANCE STATUS: 1 - Symptomatic but completely ambulatory  Vitals:   11/16/16 1429 11/16/16 1500  BP: (!) 92/55 108/62  Pulse: 63   Resp: 18   Temp: 98.3 F (36.8 C)     Physical Exam  Constitutional: She is oriented to person, place, and time and well-developed, well-nourished, and in no distress.  HENT:  Head: Normocephalic and atraumatic.  Mouth/Throat: Oropharynx is clear and moist. No oropharyngeal exudate.  Eyes: Pupils are equal, round, and reactive to light. No scleral icterus.  Neck: Neck supple.  Cardiovascular: Normal rate, regular rhythm and normal heart sounds.   Pulmonary/Chest: Effort normal and breath sounds normal. No respiratory distress. She has no wheezes.  Abdominal: Soft. Bowel sounds are normal. She exhibits no distension and no mass. There is no tenderness. There is no rebound and no guarding.  Musculoskeletal: She exhibits no edema.  Lymphadenopathy:    She has no cervical adenopathy.  Neurological: She is alert and oriented to person, place, and time.  Skin: Skin is warm and dry. No rash noted.  Psychiatric: Mood and affect normal.    LABORATORY DATA:  CBC    Component Value Date/Time   WBC 8.6 11/16/2016 1353   WBC 4.6 09/17/2016 1316   RBC 3.35 (L) 11/16/2016 1353   RBC 3.76 (L) 09/17/2016 1316   HGB 10.5 (L) 11/16/2016 1353   HCT 31.4 (L) 11/16/2016 1353   PLT 238 11/16/2016 1353   MCV 93.7 11/16/2016 1353   MCH 31.3 11/16/2016 1353   MCH 31.4 09/17/2016 1316   MCHC 33.4 11/16/2016 1353   MCHC 33.1 09/17/2016 1316   RDW 13.9 11/16/2016 1353    LYMPHSABS 1.9 11/16/2016 1353   MONOABS 0.8 11/16/2016 1353   EOSABS 0.0 11/16/2016 1353   BASOSABS 0.0 11/16/2016 1353    CMP     Component  Value Date/Time   NA 138 11/16/2016 1353   K 4.1 11/16/2016 1353   CL 105 09/17/2016 1316   CO2 27 11/16/2016 1353   GLUCOSE 88 11/16/2016 1353   BUN 13.0 11/16/2016 1353   CREATININE 0.7 11/16/2016 1353   CALCIUM 9.3 11/16/2016 1353   PROT 7.0 11/16/2016 1353   ALBUMIN 4.0 11/16/2016 1353   AST 15 11/16/2016 1353   ALT 11 11/16/2016 1353   ALKPHOS 84 11/16/2016 1353   BILITOT <0.22 11/16/2016 1353   GFRNONAA >60 09/17/2016 1316   GFRAA >60 09/17/2016 1316        ASSESSMENT and THERAPY PLAN:   Malignant neoplasm of lower-outer quadrant of left breast of female, estrogen receptor positive ([place]) 07/26/2016: Left lumpectomy: IDC 1.8 cm, with DCIS, margins negative, 0/2 lymph nodes, ER 80%, PR 20%, HER-2 negative ratio 1.18, Ki-67 60%, T1cN0 stage IA  Oncotype DX score 51: 10 year risk of recurrence greater than 34%, extremely high risk  Recommendation: 1. Adjuvant chemotherapy with Adriamycin/Cytoxan x 4, then weekly Taxol x 12 2. Adjuvant radiation therapy followed by 3. Adjuvant antiestrogen therapy -------------------------------------------------------------------------------------------------------------------------------- Christabella is doing well today.  Her labs are normal today.  She will proceed with treatment.  Due to her mild hypotension and occasional dizziness with position changes, she will decrease her lisinopril from 76m daily to 116mdaily and will receive 50040mS in the treatment room today in addition to her chemotherapy.    She will return in one week for labs, an appt, and likely IV fluids.    All questions were answered. The patient knows to call the clinic with any problems, questions or concerns. We can certainly see the patient much sooner if necessary.  A total of (30) minutes of face-to-face time was  spent with this patient with greater than 50% of that time in counseling and care-coordination.  This note was electronically signed. LinScot DockP 11/16/2016

## 2016-11-15 NOTE — Assessment & Plan Note (Addendum)
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 Adjuvant chemotherapy with Adriamycin/Cytoxan x 4, then weekly Taxol x 12 2. Adjuvant radiation therapy followed by 3. Adjuvant antiestrogen therapy -------------------------------------------------------------------------------------------------------------------------------- [name] is doing well today.  Her labs are normal today.  She will proceed with treatment.  Due to her mild hypotension and occasional dizziness with position changes, she will decrease her lisinopril from 34m daily to 176mdaily and will receive 50040mS in the treatment room today in addition to her chemotherapy.    She will return in one week for labs, an appt, and likely IV fluids.

## 2016-11-16 ENCOUNTER — Other Ambulatory Visit: Payer: Medicare Other

## 2016-11-16 ENCOUNTER — Ambulatory Visit: Payer: Medicare Other

## 2016-11-16 ENCOUNTER — Ambulatory Visit (HOSPITAL_BASED_OUTPATIENT_CLINIC_OR_DEPARTMENT_OTHER): Payer: Medicare Other | Admitting: Adult Health

## 2016-11-16 ENCOUNTER — Encounter: Payer: Self-pay | Admitting: Adult Health

## 2016-11-16 ENCOUNTER — Other Ambulatory Visit (HOSPITAL_BASED_OUTPATIENT_CLINIC_OR_DEPARTMENT_OTHER): Payer: Medicare Other

## 2016-11-16 ENCOUNTER — Ambulatory Visit (HOSPITAL_BASED_OUTPATIENT_CLINIC_OR_DEPARTMENT_OTHER): Payer: Medicare Other

## 2016-11-16 VITALS — BP 116/54 | HR 63

## 2016-11-16 VITALS — BP 108/62 | HR 63 | Temp 98.3°F | Resp 18 | Ht 61.0 in | Wt 193.9 lb

## 2016-11-16 DIAGNOSIS — C50512 Malignant neoplasm of lower-outer quadrant of left female breast: Secondary | ICD-10-CM

## 2016-11-16 DIAGNOSIS — Z170 Estrogen receptor positive status [ER+]: Secondary | ICD-10-CM | POA: Diagnosis not present

## 2016-11-16 DIAGNOSIS — Z95828 Presence of other vascular implants and grafts: Secondary | ICD-10-CM

## 2016-11-16 DIAGNOSIS — Z5189 Encounter for other specified aftercare: Secondary | ICD-10-CM

## 2016-11-16 DIAGNOSIS — Z5111 Encounter for antineoplastic chemotherapy: Secondary | ICD-10-CM | POA: Diagnosis not present

## 2016-11-16 LAB — COMPREHENSIVE METABOLIC PANEL
ALT: 11 U/L (ref 0–55)
AST: 15 U/L (ref 5–34)
Albumin: 4 g/dL (ref 3.5–5.0)
Alkaline Phosphatase: 84 U/L (ref 40–150)
Anion Gap: 7 mEq/L (ref 3–11)
BUN: 13 mg/dL (ref 7.0–26.0)
CO2: 27 mEq/L (ref 22–29)
Calcium: 9.3 mg/dL (ref 8.4–10.4)
Chloride: 104 mEq/L (ref 98–109)
Creatinine: 0.7 mg/dL (ref 0.6–1.1)
EGFR: >90 mL/min/{1.73_m2} (ref >90)
Glucose: 88 mg/dl (ref 70–140)
Potassium: 4.1 mEq/L (ref 3.5–5.1)
Sodium: 138 mEq/L (ref 136–145)
Total Bilirubin: <0.22 mg/dL (ref 0.20–1.20)
Total Protein: 7 g/dL (ref 6.4–8.3)

## 2016-11-16 LAB — CBC WITH DIFFERENTIAL/PLATELET
BASO%: 0.1 % (ref 0.0–2.0)
Basophils Absolute: 0 10*3/uL (ref 0.0–0.1)
EOS%: 0.2 % (ref 0.0–7.0)
Eosinophils Absolute: 0 10*3/uL (ref 0.0–0.5)
HCT: 31.4 % — ABNORMAL LOW (ref 34.8–46.6)
HGB: 10.5 g/dL — ABNORMAL LOW (ref 11.6–15.9)
LYMPH%: 22.6 % (ref 14.0–49.7)
MCH: 31.3 pg (ref 25.1–34.0)
MCHC: 33.4 g/dL (ref 31.5–36.0)
MCV: 93.7 fL (ref 79.5–101.0)
MONO#: 0.8 10*3/uL (ref 0.1–0.9)
MONO%: 9.7 % (ref 0.0–14.0)
NEUT#: 5.8 10*3/uL (ref 1.5–6.5)
NEUT%: 67.4 % (ref 38.4–76.8)
Platelets: 238 10*3/uL (ref 145–400)
RBC: 3.35 10*6/uL — ABNORMAL LOW (ref 3.70–5.45)
RDW: 13.9 % (ref 11.2–14.5)
WBC: 8.6 10*3/uL (ref 3.9–10.3)
lymph#: 1.9 10*3/uL (ref 0.9–3.3)

## 2016-11-16 MED ORDER — SODIUM CHLORIDE 0.9 % IV SOLN
Freq: Once | INTRAVENOUS | Status: AC
  Administered 2016-11-16: 16:00:00 via INTRAVENOUS

## 2016-11-16 MED ORDER — SODIUM CHLORIDE 0.9 % IV SOLN
600.0000 mg/m2 | Freq: Once | INTRAVENOUS | Status: AC
  Administered 2016-11-16: 1200 mg via INTRAVENOUS
  Filled 2016-11-16: qty 60

## 2016-11-16 MED ORDER — PALONOSETRON HCL INJECTION 0.25 MG/5ML
0.2500 mg | Freq: Once | INTRAVENOUS | Status: AC
  Administered 2016-11-16: 0.25 mg via INTRAVENOUS

## 2016-11-16 MED ORDER — DOXORUBICIN HCL CHEMO IV INJECTION 2 MG/ML
60.0000 mg/m2 | Freq: Once | INTRAVENOUS | Status: AC
  Administered 2016-11-16: 120 mg via INTRAVENOUS
  Filled 2016-11-16: qty 60

## 2016-11-16 MED ORDER — SODIUM CHLORIDE 0.9 % IV SOLN
INTRAVENOUS | Status: DC
  Administered 2016-11-16: 15:00:00 via INTRAVENOUS

## 2016-11-16 MED ORDER — PEGFILGRASTIM 6 MG/0.6ML ~~LOC~~ PSKT
6.0000 mg | PREFILLED_SYRINGE | Freq: Once | SUBCUTANEOUS | Status: AC
  Administered 2016-11-16: 6 mg via SUBCUTANEOUS
  Filled 2016-11-16: qty 0.6

## 2016-11-16 MED ORDER — FOSAPREPITANT DIMEGLUMINE INJECTION 150 MG
Freq: Once | INTRAVENOUS | Status: AC
  Administered 2016-11-16: 16:00:00 via INTRAVENOUS
  Filled 2016-11-16: qty 5

## 2016-11-16 MED ORDER — SODIUM CHLORIDE 0.9% FLUSH
10.0000 mL | Freq: Once | INTRAVENOUS | Status: AC
  Administered 2016-11-16: 10 mL
  Filled 2016-11-16: qty 10

## 2016-11-16 MED ORDER — SODIUM CHLORIDE 0.9% FLUSH
10.0000 mL | INTRAVENOUS | Status: DC | PRN
  Administered 2016-11-16: 10 mL
  Filled 2016-11-16: qty 10

## 2016-11-16 MED ORDER — PALONOSETRON HCL INJECTION 0.25 MG/5ML
INTRAVENOUS | Status: AC
  Filled 2016-11-16: qty 5

## 2016-11-16 MED ORDER — HEPARIN SOD (PORK) LOCK FLUSH 100 UNIT/ML IV SOLN
500.0000 [IU] | Freq: Once | INTRAVENOUS | Status: AC | PRN
  Administered 2016-11-16: 500 [IU]
  Filled 2016-11-16: qty 5

## 2016-11-16 NOTE — Patient Instructions (Signed)
[facility] Discharge Instructions for Patients Receiving Chemotherapy  Today you received the following chemotherapy agents Adriamycin and Cytoxan.   To help prevent nausea and vomiting after your treatment, we encourage you to take your nausea medication as directed. BUT NO ZOFRAN FOR 3 DAYS AFTER CHEMO.   If you develop nausea and vomiting that is not controlled by your nausea medication, call the clinic.   BELOW ARE SYMPTOMS THAT SHOULD BE REPORTED IMMEDIATELY:  *FEVER GREATER THAN 100.5 F  *CHILLS WITH OR WITHOUT FEVER  NAUSEA AND VOMITING THAT IS NOT CONTROLLED WITH YOUR NAUSEA MEDICATION  *UNUSUAL SHORTNESS OF BREATH  *UNUSUAL BRUISING OR BLEEDING  TENDERNESS IN MOUTH AND THROAT WITH OR WITHOUT PRESENCE OF ULCERS  *URINARY PROBLEMS  *BOWEL PROBLEMS  UNUSUAL RASH Items with * indicate a potential emergency and should be followed up as soon as possible.  Feel free to call the clinic you have any questions or concerns. The clinic phone number is [phone].  Please show the CHEMO ALERT CARD at check-in to the Emergency Department and triage nurse.   

## 2016-11-19 ENCOUNTER — Telehealth: Payer: Self-pay

## 2016-11-19 NOTE — Telephone Encounter (Signed)
Called pt and told pt to try pepcid BID for a few days. Gave some dietary instructions on trigger foods with acid reflux.

## 2016-11-19 NOTE — Progress Notes (Signed)
Spoke with pt and told pt to try pepcid BID for her acid reflux. Pt may get this over the counter. It may take 2-3 days to have full improvement of symptoms. Pt states that her heartburn and cp happens after eating. She is on ranitidine but is not helping. Gave pt diet education on food triggers with acid reflux and to spread her meals into 5-6 small meals per day. Told pt to take pepcid on empty stomach in the morning before eating and before bedtime. Told pt that the reflux can be side effect of chemo, especially a week after treatment. Pt states that she is eating and hydrating well, but just keeps having acid come up. Told pt to try pepcid for a few days and if her symptoms doesn't improve, advised that she call our office. Denies any sob, numbness tingling, or chest pain (cardiac related symptoms), dizziness and cold sweat. Told pt if these symptoms occur,she should go to er for evaluation. Pt verbalized understanding and will try pepcid twice a day to see if it helps with her acid reflux.

## 2016-11-19 NOTE — Telephone Encounter (Signed)
Pt had adria and cytoxan on Friday. She has been stomach cramping and pain or heartburn ever since. No vomiting. No constipation nor diarrhea. She is also feeling a little jittery.  She was able to drink 40 oz fluids yesterday. Encouraged her to use her nausea meds and continue fluids.  She is using tylenol 1000 mg and ranitidine. What else can she do?

## 2016-11-22 NOTE — Assessment & Plan Note (Addendum)
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 Adjuvant chemotherapy with Adriamycin/Cytoxan x 4, then weekly Taxol x 12 2. Adjuvant radiation therapy followed by 3. Adjuvant antiestrogen therapy -------------------------------------------------------------------------------------------------------------------------------- [name] is not feeling well today.  She is discouraged with her fatigue and abdominal discomfort.  I prescribed Omeprazole 40mg daily, Carafate four times a day, and hopefully that will help alleviate her post prandial epigastric pain.  In regards to her urinary frequency, I increased her Oxybutynin to TID, and recommended that she decrease her PO fluid intake int he evening.  She may need a referral to Cheryl Grey, PT for pelvic floor rehabilitation.  She will receive one liter of IV fluids today.  She has very mild thrush and I will send in Diflucan 150mg po x 2.   

## 2016-11-22 NOTE — Progress Notes (Signed)
[facility] Follow up:    Alicia Frees, MD [address] [place] [address]   DIAGNOSIS: Cancer Staging Malignant neoplasm of lower-outer quadrant of left breast of female, estrogen receptor positive ([place]) Staging form: Breast, AJCC 8th Edition - Clinical: Stage IA (cT1c, cN0(sn), cM0, G3, ER: Positive, PR: Positive, HER2: Negative, Oncotype DX score: 51) - Unsigned   SUMMARY OF ONCOLOGIC HISTORY:   Malignant neoplasm of lower-outer quadrant of left breast of female, estrogen receptor positive ([place])   06/26/2016 Initial Diagnosis    Left breast biopsy 3:00: IDC with DCIS, grade 3, ER 80%, PR 20%, Ki-67 60%, HER-2 negative ratio 1.18; palpable lump: 1.8 cm lesion, no lymph nodes, T1 cN0 stage I a clinical stage      07/26/2016 Surgery    Left lumpectomy: IDC 1.8 cm, with DCIS, margins negative, 0/2 lymph nodes, ER 80%, PR 20%, HER-2 negative ratio 1.18, Ki-67 60%, T1cN0 stage IA       08/21/2016 Oncotype testing    Oncotype DX recurrence score 51, risk of recurrence 34%      08/21/2016 Genetic Testing    Genetic counseling and testing for hereditary cancer syndromes performed on 08/21/2016. Results are negative for pathogenic mutations in 46 genes analyzed by [facility]'s Common Hereditary Cancers Panel. Results are dated 09/14/2016. Genes tested: APC, ATM, AXIN2, BARD1, BMPR1A, BRCA1, BRCA2, BRIP1, CDH1, CDKN2A, CHEK2, CTNNA1, DICER1, EPCAM, GREM1, HOXB13, KIT, MEN1, MLH1, MSH2, MSH3, MSH6, MUTYH, NBN, NF1, NTHL1, PALB2, PDGFRA, PMS2, POLD1, POLE, PTEN, RAD50, RAD51C, RAD51D, SDHA, SDHB, SDHC, SDHD, SMAD4, SMARCA4, STK11, TP53, TSC1, TSC2, and VHL.         CURRENT THERAPY: Cycle 2 day 8 of dose dense AC  INTERVAL HISTORY: [name] 56 y.o. female returns for evaluation about a week after receiving her second cycle of dose dense AC.  She is very fatigued, and has had some epigastric discomfort this past week.  This pain, is worse after  eating.  She has had decreased PO intake due to this and some days has skipped her medications.  She was dizzy last week, so we backed down her Lisinopril to 28m daily.  She says that backing it down has helped.  She also has had an increase in urinary frequency, worse at night.  She has this chronically which has previously been controlled on Oxybutynin, however since starting chemotherapy, it has been getting worse, particularly at night and she says she is getting up hourly to urinate.     Patient Active Problem List   Diagnosis Date Noted  . Port catheter in place 11/02/2016  . Genetic testing 09/19/2016  . Pre-operative clearance 07/23/2016  . Malignant neoplasm of lower-outer quadrant of left breast of female, estrogen receptor positive ([facility] 07/10/2016  . Essential hypertension   . Chest pain 09/30/2015  . DVT, lower extremity ([facility] 06/18/2011  . DVT of upper extremity (deep vein thrombosis) ([facility] 06/18/2011  . [place] filter in place 06/18/2011  . History of stroke 06/18/2011  . PFO (patent foramen ovale) 06/18/2011  . Status post gastric bypass for obesity 06/18/2011  . PELVIC PAIN, CHRONIC 07/20/2008  . FIBROIDS, UTERUS 07/16/2008  . ASTHMA 07/16/2008  . FIBROMYALGIA 07/16/2008  . CARPAL TUNNEL SYNDROME, HX OF 07/16/2008  . History of pulmonary embolus (PE) 07/16/2008    is allergic to aspirin; oxycodone hcl; propoxyphene n-acetaminophen; tramadol; adhesive [tape]; and prednisone.  MEDICAL HISTORY: Past Medical History:  Diagnosis Date  . Anemia  since bypass  . Arthritis    osteoarthritis  . Asthma    states no asthma attack since 2002  . Breast cancer ([place]) 06/26/16 bx   left breast  . Chronic back pain   . Complication of anesthesia    states takes more than normal to put her to sleep  . Dental bridge present    upper  . Dental crowns present   . DVT of upper extremity (deep vein thrombosis) ([place])   . Fibromyalgia   . Genetic testing 09/19/2016   Ms.  [name] underwent genetic counseling and testing for hereditary cancer syndromes on 08/21/2016. Her results were negative for mutations in all 46 genes analyzed by [facility]'s 46-gene Common Hereditary Cancers Panel. Genes analyzed include: APC, ATM, AXIN2, BARD1, BMPR1A, BRCA1, BRCA2, BRIP1, CDH1, CDKN2A, CHEK2, CTNNA1, DICER1, EPCAM, GREM1, HOXB13, KIT, MEN1, MLH1, MSH2, MSH3, MSH6, MUTYH, NBN,   . Headache(784.0)    migraines  . History of blood transfusion 06/2005  . History of gallstones   . History of pneumonia   . History of shingles 07/2011  . HTN (hypertension)   . Normal coronary arteries 2003  . PFO (patent foramen ovale)   . Presence of inferior vena cava filter   . Pulmonary embolism ([place])   . Sleep apnea    used CPAP until after bypass surg.  . Status post gastric bypass for obesity   . Stroke [facility]) 12/2002   right-sided weakness  . Urinary incontinence     SURGICAL HISTORY: Past Surgical History:  Procedure Laterality Date  . ABDOMINAL HYSTERECTOMY  11/1997   complete  . ANTERIOR CERVICAL DECOMP/DISCECTOMY FUSION  02/05/2005   C5-6  . APPENDECTOMY  10/22/2008   laparoscopic  . BREAST LUMPECTOMY WITH RADIOACTIVE SEED AND SENTINEL LYMPH NODE BIOPSY Left 07/26/2016   Procedure: BREAST LUMPECTOMY WITH RADIOACTIVE SEED AND SENTINEL LYMPH NODE BIOPSY;  Surgeon: Erroll Luna, MD;  Location: [place];  Service: General;  Laterality: Left;  . BUNIONECTOMY  05/1980   both feet  . BUNIONECTOMY  08/2011   left foot  . CARDIAC CATHETERIZATION  03/04/2002  . CARPAL TUNNEL RELEASE  06/21/2009   right  . CARPAL TUNNEL RELEASE     left hand  . CARPAL TUNNEL RELEASE  10/03/2011   Procedure: CARPAL TUNNEL RELEASE;  Surgeon: Wynonia Sours, MD;  Location: [place];  Service: Orthopedics;  Laterality: Right;  CARPAL TUNNEL WITH HYPOTHENAR FAT PAD TRANSFER  . CERVICAL SPINE SURGERY  01/2005   titanium plate implanted  . CHOLECYSTECTOMY  1990  . ELBOW SURGERY   08/09/2004   decompression ulnar nerve right elbow  . ENTEROLYSIS  10/22/2008   laparoscopic abd. enterolysis  . GASTRIC ROUX-EN-Y  2009  . HEEL SPUR SURGERY  08/1997   left  . HEMORRHOID SURGERY  03/1993  . LAPAROSCOPIC LYSIS INTESTINAL ADHESIONS  02/14/2000  . NAILBED REPAIR  01/10/2005; 08/2011   exc. matrix bilat. great toe  . OTHER SURGICAL HISTORY  12/1986   pt states that she had surgery to unclog her fallopean tubes  . PORTACATH PLACEMENT N/A 09/24/2016   Procedure: INSERTION PORT-A-CATH WITH [name];  Surgeon: Erroll Luna, MD;  Location: [place];  Service: General;  Laterality: N/A;  . SHOULDER SURGERY     bilat. - (left:  06/2005)  . TONSILLECTOMY  07/1995  . TRIGGER FINGER RELEASE  04/25/2006   decompression A-1 pulley left thumb  . UTERINE FIBROID SURGERY  12/95, 7/96   x2  .  VENA CAVA FILTER PLACEMENT  2009   during Roux-en-Y surg.    SOCIAL HISTORY: Social History   Social History  . Marital status: Single    Spouse name: N/A  . Number of children: 0  . Years of education: N/A   Occupational History  . disabled    Social History Main Topics  . Smoking status: Former Research scientist (life sciences)  . Smokeless tobacco: Never Used     Comment: quit smoking 08/1989  . Alcohol use No  . Drug use: No  . Sexual activity: No   Other Topics Concern  . Not on file   Social History Narrative  . No narrative on file    FAMILY HISTORY: Family History  Problem Relation Age of Onset  . Breast cancer Paternal Aunt 25  . Cervical cancer Paternal Grandmother 60       d.40s  . Ovarian cancer Maternal Grandmother 23       d.23  . Colon polyps Father   . Diabetes Father        borderline  . Prostate cancer Father   . Hypertension Mother   . Hypertension Unknown   . Breast cancer Sister 79       treated with neoadjuvant chemo/radiation and lumpectomy    Review of Systems  Constitutional: Positive for fatigue. Negative for appetite change, chills, fever and unexpected weight change.  HENT:    Negative for hearing loss and lump/mass.   Eyes: Negative for eye problems and icterus.  Respiratory: Negative for chest tightness, cough and shortness of breath.   Cardiovascular: Negative for chest pain, leg swelling and palpitations.  Gastrointestinal: Negative for abdominal distention, abdominal pain, constipation, diarrhea, nausea and vomiting.  Endocrine: Negative for hot flashes.  Genitourinary: Positive for bladder incontinence (urge) and frequency. Negative for difficulty urinating, dysuria and hematuria.   Musculoskeletal: Negative for arthralgias and gait problem.  Skin: Negative for itching and rash.  Neurological: Negative for dizziness, extremity weakness, gait problem, headaches and numbness.  Hematological: Negative for adenopathy. Does not bruise/bleed easily.  Psychiatric/Behavioral: The patient is not nervous/anxious.       PHYSICAL EXAMINATION  ECOG PERFORMANCE STATUS: 2 - Symptomatic, <50% confined to bed  Vitals:   11/23/16 0846  BP: 119/66  Pulse: 72  Resp: 18  Temp: 97.8 F (36.6 C)    Physical Exam  Constitutional: She is oriented to person, place, and time and well-developed, well-nourished, and in no distress.  HENT:  Head: Normocephalic and atraumatic.  Mouth/Throat: Oropharynx is clear and moist.  Three very small white patches on left buccal mucosa  Eyes: Pupils are equal, round, and reactive to light. No scleral icterus.  Neck: Neck supple.  Cardiovascular: Normal rate, regular rhythm and normal heart sounds.   Pulmonary/Chest: Effort normal and breath sounds normal.  Abdominal: Soft. Bowel sounds are normal. She exhibits no distension. There is no tenderness. There is no rebound and no guarding.  Musculoskeletal: She exhibits no edema.  Lymphadenopathy:    She has no cervical adenopathy.  Neurological: She is alert and oriented to person, place, and time.  Skin: Skin is warm and dry. No rash noted.  Psychiatric:  Tearful throughout  conversation    LABORATORY DATA:  CBC    Component Value Date/Time   WBC 1.7 (L) 11/23/2016 0831   WBC 4.6 09/17/2016 1316   RBC 3.60 (L) 11/23/2016 0831   RBC 3.76 (L) 09/17/2016 1316   HGB 11.2 (L) 11/23/2016 0831   HCT 33.6 (L) 11/23/2016 0831  PLT 205 11/23/2016 0831   MCV 93.3 11/23/2016 0831   MCH 31.1 11/23/2016 0831   MCH 31.4 09/17/2016 1316   MCHC 33.3 11/23/2016 0831   MCHC 33.1 09/17/2016 1316   RDW 14.1 11/23/2016 0831   LYMPHSABS 0.4 (L) 11/23/2016 0831   MONOABS 0.1 11/23/2016 0831   EOSABS 0.0 11/23/2016 0831   BASOSABS 0.0 11/23/2016 0831    CMP     Component Value Date/Time   NA 136 11/23/2016 0831   K 4.2 11/23/2016 0831   CL 105 09/17/2016 1316   CO2 26 11/23/2016 0831   GLUCOSE 107 11/23/2016 0831   BUN 17.0 11/23/2016 0831   CREATININE 0.7 11/23/2016 0831   CALCIUM 9.7 11/23/2016 0831   PROT 7.3 11/23/2016 0831   ALBUMIN 4.0 11/23/2016 0831   AST 13 11/23/2016 0831   ALT 8 11/23/2016 0831   ALKPHOS 118 11/23/2016 0831   BILITOT 0.66 11/23/2016 0831   GFRNONAA >60 09/17/2016 1316   GFRAA >60 09/17/2016 1316       ASSESSMENT and PLAN:   Malignant neoplasm of lower-outer quadrant of left breast of female, estrogen receptor positive ([place]) 07/26/2016: Left lumpectomy: IDC 1.8 cm, with DCIS, margins negative, 0/2 lymph nodes, ER 80%, PR 20%, HER-2 negative ratio 1.18, Ki-67 60%, T1cN0 stage IA  Oncotype DX score 51: 10 year risk of recurrence greater than 34%, extremely high risk  Recommendation: 1. Adjuvant chemotherapy with Adriamycin/Cytoxan x 4, then weekly Taxol x 12 2. Adjuvant radiation therapy followed by 3. Adjuvant antiestrogen therapy -------------------------------------------------------------------------------------------------------------------------------- Nimrit is not feeling well today.  She is discouraged with her fatigue and abdominal discomfort.  I prescribed Omeprazole 48m daily, Carafate four times a day, and  hopefully that will help alleviate her post prandial epigastric pain.  In regards to her urinary frequency, I increased her Oxybutynin to TID, and recommended that she decrease her PO fluid intake int he evening.  She may need a referral to [facility] for pelvic floor rehabilitation.  She will receive one liter of IV fluids today.  She has very mild thrush and I will send in Diflucan 1540mpo x 2.     Orders Placed This Encounter  Procedures  . Urine Culture    Standing Status:   Future    Number of Occurrences:   1    Standing Expiration Date:   11/23/2017  . Urinalysis with microscopic    Standing Status:   Future    Number of Occurrences:   1    Standing Expiration Date:   11/23/2017    All questions were answered. The patient knows to call the clinic with any problems, questions or concerns. We can certainly see the patient much sooner if necessary.  A total of (30) minutes of face-to-face time was spent with this patient with greater than 50% of that time in counseling and care-coordination.  This note was electronically signed. LiScot DockNP 11/23/2016

## 2016-11-23 ENCOUNTER — Encounter: Payer: Self-pay | Admitting: Adult Health

## 2016-11-23 ENCOUNTER — Ambulatory Visit (HOSPITAL_BASED_OUTPATIENT_CLINIC_OR_DEPARTMENT_OTHER): Payer: Medicare Other | Admitting: Adult Health

## 2016-11-23 ENCOUNTER — Other Ambulatory Visit (HOSPITAL_BASED_OUTPATIENT_CLINIC_OR_DEPARTMENT_OTHER): Payer: Medicare Other

## 2016-11-23 ENCOUNTER — Other Ambulatory Visit: Payer: Self-pay | Admitting: Adult Health

## 2016-11-23 ENCOUNTER — Ambulatory Visit (HOSPITAL_BASED_OUTPATIENT_CLINIC_OR_DEPARTMENT_OTHER): Payer: Medicare Other

## 2016-11-23 VITALS — BP 106/65 | HR 62 | Temp 98.6°F | Resp 16

## 2016-11-23 VITALS — BP 119/66 | HR 72 | Temp 97.8°F | Resp 18 | Ht 61.0 in | Wt 188.5 lb

## 2016-11-23 DIAGNOSIS — R109 Unspecified abdominal pain: Secondary | ICD-10-CM

## 2016-11-23 DIAGNOSIS — R350 Frequency of micturition: Secondary | ICD-10-CM

## 2016-11-23 DIAGNOSIS — C50512 Malignant neoplasm of lower-outer quadrant of left female breast: Secondary | ICD-10-CM

## 2016-11-23 DIAGNOSIS — B370 Candidal stomatitis: Secondary | ICD-10-CM

## 2016-11-23 DIAGNOSIS — R1013 Epigastric pain: Secondary | ICD-10-CM

## 2016-11-23 DIAGNOSIS — Z170 Estrogen receptor positive status [ER+]: Secondary | ICD-10-CM

## 2016-11-23 DIAGNOSIS — A5900 Urogenital trichomoniasis, unspecified: Secondary | ICD-10-CM

## 2016-11-23 LAB — URINALYSIS, MICROSCOPIC - CHCC
Bilirubin (Urine): NEGATIVE
Glucose: NEGATIVE mg/dL
Ketones: NEGATIVE mg/dL
Leukocyte Esterase: NEGATIVE
Nitrite: NEGATIVE
Protein: NEGATIVE mg/dL
Specific Gravity, Urine: 1.025 (ref 1.003–1.035)
Urobilinogen, UR: 0.2 mg/dL (ref 0.2–1)
WBC, UA: NEGATIVE (ref 0–?)
pH: 6 (ref 4.6–8.0)

## 2016-11-23 LAB — CBC WITH DIFFERENTIAL/PLATELET
BASO%: 2.3 % — ABNORMAL HIGH (ref 0.0–2.0)
Basophils Absolute: 0 10*3/uL (ref 0.0–0.1)
EOS%: 0.6 % (ref 0.0–7.0)
Eosinophils Absolute: 0 10*3/uL (ref 0.0–0.5)
HCT: 33.6 % — ABNORMAL LOW (ref 34.8–46.6)
HGB: 11.2 g/dL — ABNORMAL LOW (ref 11.6–15.9)
LYMPH%: 20.7 % (ref 14.0–49.7)
MCH: 31.1 pg (ref 25.1–34.0)
MCHC: 33.3 g/dL (ref 31.5–36.0)
MCV: 93.3 fL (ref 79.5–101.0)
MONO#: 0.1 10*3/uL (ref 0.1–0.9)
MONO%: 6.9 % (ref 0.0–14.0)
NEUT#: 1.2 10*3/uL — ABNORMAL LOW (ref 1.5–6.5)
NEUT%: 69.5 % (ref 38.4–76.8)
Platelets: 205 10*3/uL (ref 145–400)
RBC: 3.6 10*6/uL — ABNORMAL LOW (ref 3.70–5.45)
RDW: 14.1 % (ref 11.2–14.5)
WBC: 1.7 10*3/uL — ABNORMAL LOW (ref 3.9–10.3)
lymph#: 0.4 10*3/uL — ABNORMAL LOW (ref 0.9–3.3)

## 2016-11-23 LAB — COMPREHENSIVE METABOLIC PANEL
ALT: 8 U/L (ref 0–55)
AST: 13 U/L (ref 5–34)
Albumin: 4 g/dL (ref 3.5–5.0)
Alkaline Phosphatase: 118 U/L (ref 40–150)
Anion Gap: 8 mEq/L (ref 3–11)
BUN: 17 mg/dL (ref 7.0–26.0)
CO2: 26 mEq/L (ref 22–29)
Calcium: 9.7 mg/dL (ref 8.4–10.4)
Chloride: 102 mEq/L (ref 98–109)
Creatinine: 0.7 mg/dL (ref 0.6–1.1)
EGFR: >90 mL/min/{1.73_m2} (ref >90)
Glucose: 107 mg/dl (ref 70–140)
Potassium: 4.2 mEq/L (ref 3.5–5.1)
Sodium: 136 mEq/L (ref 136–145)
Total Bilirubin: 0.66 mg/dL (ref 0.20–1.20)
Total Protein: 7.3 g/dL (ref 6.4–8.3)

## 2016-11-23 MED ORDER — FLUCONAZOLE 150 MG PO TABS: 150.0000 mg | ORAL_TABLET | Freq: Every day | ORAL | 2 refills | Status: DC

## 2016-11-23 MED ORDER — METRONIDAZOLE 500 MG PO TABS: 2000.0000 mg | ORAL_TABLET | Freq: Once | ORAL | 0 refills | Status: AC

## 2016-11-23 MED ORDER — SODIUM CHLORIDE 0.9 % IV SOLN
Freq: Once | INTRAVENOUS | Status: AC
  Administered 2016-11-23: 10:00:00 via INTRAVENOUS

## 2016-11-23 MED ORDER — OXYBUTYNIN CHLORIDE 5 MG PO TABS: 5.0000 mg | ORAL_TABLET | Freq: Three times a day (TID) | ORAL | 0 refills | Status: DC

## 2016-11-23 MED ORDER — SODIUM CHLORIDE 0.9% FLUSH
10.0000 mL | Freq: Once | INTRAVENOUS | Status: AC
  Administered 2016-11-23: 10 mL via INTRAVENOUS
  Filled 2016-11-23: qty 10

## 2016-11-23 MED ORDER — SUCRALFATE 1 G PO TABS: 1.0000 g | ORAL_TABLET | Freq: Three times a day (TID) | ORAL | 0 refills | Status: DC

## 2016-11-23 MED ORDER — HEPARIN SOD (PORK) LOCK FLUSH 100 UNIT/ML IV SOLN
500.0000 [IU] | Freq: Once | INTRAVENOUS | Status: AC
  Administered 2016-11-23: 500 [IU] via INTRAVENOUS
  Filled 2016-11-23: qty 5

## 2016-11-23 MED ORDER — METRONIDAZOLE 500 MG PO TABS: 2000.0000 mg | ORAL_TABLET | Freq: Three times a day (TID) | ORAL | 0 refills | Status: DC

## 2016-11-23 MED ORDER — OMEPRAZOLE 40 MG PO CPDR: 40.0000 mg | DELAYED_RELEASE_CAPSULE | Freq: Every day | ORAL | 5 refills | Status: DC

## 2016-11-23 MED ORDER — FLUCONAZOLE 150 MG PO TABS: 150.0000 mg | ORAL_TABLET | Freq: Every day | ORAL | 2 refills | Status: AC

## 2016-11-23 MED FILL — SUCRALFATE 1 GM TABLET: 1 | 30 days supply | Qty: 120 | Fill #0

## 2016-11-23 MED FILL — metroNIDAZOLE 500 MG TABS: 500 | 1 days supply | Qty: 4 | Fill #0

## 2016-11-23 MED FILL — OMEPRAZOLE DR 40 MG CAPSULE: 40 | 30 days supply | Qty: 30 | Fill #0

## 2016-11-23 MED FILL — FLUCONAZOLE 150 MG TABLET: 150 | 2 days supply | Qty: 2 | Fill #0

## 2016-11-23 NOTE — Patient Instructions (Signed)
Dehydration, Adult Dehydration is a condition in which there is not enough fluid or water in the body. This happens when you lose more fluids than you take in. Important organs, such as the kidneys, brain, and heart, cannot function without a proper amount of fluids. Any loss of fluids from the body can lead to dehydration. Dehydration can range from mild to severe. This condition should be treated right away to prevent it from becoming severe. What are the causes? This condition may be caused by:  Vomiting.  Diarrhea.  Excessive sweating, such as from heat exposure or exercise.  Not drinking enough fluid, especially: ? When ill. ? While doing activity that requires a lot of energy.  Excessive urination.  Fever.  Infection.  Certain medicines, such as medicines that cause the body to lose excess fluid (diuretics).  Inability to access safe drinking water.  Reduced physical ability to get adequate water and food.  What increases the risk? This condition is more likely to develop in people:  Who have a poorly controlled long-term (chronic) illness, such as diabetes, heart disease, or kidney disease.  Who are age 65 or older.  Who are disabled.  Who live in a place with high altitude.  Who play endurance sports.  What are the signs or symptoms? Symptoms of mild dehydration may include:  Thirst.  Dry lips.  Slightly dry mouth.  Dry, warm skin.  Dizziness. Symptoms of moderate dehydration may include:  Very dry mouth.  Muscle cramps.  Dark urine. Urine may be the color of tea.  Decreased urine production.  Decreased tear production.  Heartbeat that is irregular or faster than normal (palpitations).  Headache.  Light-headedness, especially when you stand up from a sitting position.  Fainting (syncope). Symptoms of severe dehydration may include:  Changes in skin, such as: ? Cold and clammy skin. ? Blotchy (mottled) or pale skin. ? Skin that does  not quickly return to normal after being lightly pinched and released (poor skin turgor).  Changes in body fluids, such as: ? Extreme thirst. ? No tear production. ? Inability to sweat when body temperature is high, such as in hot weather. ? Very little urine production.  Changes in vital signs, such as: ? Weak pulse. ? Pulse that is more than 100 beats a minute when sitting still. ? Rapid breathing. ? Low blood pressure.  Other changes, such as: ? Sunken eyes. ? Cold hands and feet. ? Confusion. ? Lack of energy (lethargy). ? Difficulty waking up from sleep. ? Short-term weight loss. ? Unconsciousness. How is this diagnosed? This condition is diagnosed based on your symptoms and a physical exam. Blood and urine tests may be done to help confirm the diagnosis. How is this treated? Treatment for this condition depends on the severity. Mild or moderate dehydration can often be treated at home. Treatment should be started right away. Do not wait until dehydration becomes severe. Severe dehydration is an emergency and it needs to be treated in a hospital. Treatment for mild dehydration may include:  Drinking more fluids.  Replacing salts and minerals in your blood (electrolytes) that you may have lost. Treatment for moderate dehydration may include:  Drinking an oral rehydration solution (ORS). This is a drink that helps you replace fluids and electrolytes (rehydrate). It can be found at pharmacies and retail stores. Treatment for severe dehydration may include:  Receiving fluids through an IV tube.  Receiving an electrolyte solution through a feeding tube that is passed through your nose   and into your stomach (nasogastric tube, or NG tube).  Correcting any abnormalities in electrolytes.  Treating the underlying cause of dehydration. Follow these instructions at home:  If directed by your health care provider, drink an ORS: ? Make an ORS by following instructions on the  package. ? Start by drinking small amounts, about  cup (120 mL) every 5-10 minutes. ? Slowly increase how much you drink until you have taken the amount recommended by your health care provider.  Drink enough clear fluid to keep your urine clear or pale yellow. If you were told to drink an ORS, finish the ORS first, then start slowly drinking other clear fluids. Drink fluids such as: ? Water. Do not drink only water. Doing that can lead to having too little salt (sodium) in the body (hyponatremia). ? Ice chips. ? Fruit juice that you have added water to (diluted fruit juice). ? Low-calorie sports drinks.  Avoid: ? Alcohol. ? Drinks that contain a lot of sugar. These include high-calorie sports drinks, fruit juice that is not diluted, and soda. ? Caffeine. ? Foods that are greasy or contain a lot of fat or sugar.  Take over-the-counter and prescription medicines only as told by your health care provider.  Do not take sodium tablets. This can lead to having too much sodium in the body (hypernatremia).  Eat foods that contain a healthy balance of electrolytes, such as bananas, oranges, potatoes, tomatoes, and spinach.  Keep all follow-up visits as told by your health care provider. This is important. Contact a health care provider if:  You have abdominal pain that: ? Gets worse. ? Stays in one area (localizes).  You have a rash.  You have a stiff neck.  You are more irritable than usual.  You are sleepier or more difficult to wake up than usual.  You feel weak or dizzy.  You feel very thirsty.  You have urinated only a small amount of very dark urine over 6-8 hours. Get help right away if:  You have symptoms of severe dehydration.  You cannot drink fluids without vomiting.  Your symptoms get worse with treatment.  You have a fever.  You have a severe headache.  You have vomiting or diarrhea that: ? Gets worse. ? Does not go away.  You have blood or green matter  (bile) in your vomit.  You have blood in your stool. This may cause stool to look black and tarry.  You have not urinated in 6-8 hours.  You faint.  Your heart rate while sitting still is over 100 beats a minute.  You have trouble breathing. This information is not intended to replace advice given to you by your health care provider. Make sure you discuss any questions you have with your health care provider. Document Released: 05/07/2005 Document Revised: 12/02/2015 Document Reviewed: 07/01/2015 Elsevier Interactive Patient Education  2018 [employer]  

## 2016-11-24 LAB — URINE CULTURE

## 2016-11-29 ENCOUNTER — Telehealth: Payer: Self-pay

## 2016-11-29 NOTE — Telephone Encounter (Signed)
Pt calling to report some soreness (intermittent) from her R port. Pt states that it started last night in her sleep and early this morning. She is not having discomfort currently and would like to find out if this is safe to use tomorrow for treatment. Denies any swelling, redness, or pain upon deep breathing. Pt states that the soreness comes and goes. Advised pt to take some ibuprofen or tylenol. It may have been from sleeping or overuse of extremity. Pt remembers doing some crochet yesterday, and that she normally lays on her left side, but because of arthritic pain, she must have slept on the R side. Told pt to monitor closely for changes and will assess tomorrow when she comes for treatment. Pt thankful for time and has no further questions/concerns at this time.

## 2016-11-30 ENCOUNTER — Encounter: Payer: Self-pay | Admitting: Hematology and Oncology

## 2016-11-30 ENCOUNTER — Ambulatory Visit (HOSPITAL_BASED_OUTPATIENT_CLINIC_OR_DEPARTMENT_OTHER): Payer: Medicare Other

## 2016-11-30 ENCOUNTER — Encounter: Payer: Self-pay | Admitting: *Deleted

## 2016-11-30 ENCOUNTER — Other Ambulatory Visit: Payer: Medicare Other

## 2016-11-30 ENCOUNTER — Ambulatory Visit (HOSPITAL_BASED_OUTPATIENT_CLINIC_OR_DEPARTMENT_OTHER): Payer: Medicare Other | Admitting: Hematology and Oncology

## 2016-11-30 ENCOUNTER — Ambulatory Visit: Payer: Medicare Other

## 2016-11-30 ENCOUNTER — Other Ambulatory Visit (HOSPITAL_BASED_OUTPATIENT_CLINIC_OR_DEPARTMENT_OTHER): Payer: Medicare Other

## 2016-11-30 VITALS — BP 115/46 | HR 68 | Temp 98.0°F | Resp 20 | Ht 61.0 in | Wt 196.1 lb

## 2016-11-30 DIAGNOSIS — Z170 Estrogen receptor positive status [ER+]: Secondary | ICD-10-CM | POA: Diagnosis not present

## 2016-11-30 DIAGNOSIS — Z5111 Encounter for antineoplastic chemotherapy: Secondary | ICD-10-CM | POA: Diagnosis not present

## 2016-11-30 DIAGNOSIS — Z5189 Encounter for other specified aftercare: Secondary | ICD-10-CM | POA: Diagnosis not present

## 2016-11-30 DIAGNOSIS — C50512 Malignant neoplasm of lower-outer quadrant of left female breast: Secondary | ICD-10-CM

## 2016-11-30 LAB — COMPREHENSIVE METABOLIC PANEL
ALT: 10 U/L (ref 0–55)
AST: 12 U/L (ref 5–34)
Albumin: 3.9 g/dL (ref 3.5–5.0)
Alkaline Phosphatase: 78 U/L (ref 40–150)
Anion Gap: 6 mEq/L (ref 3–11)
BUN: 13.8 mg/dL (ref 7.0–26.0)
CO2: 25 mEq/L (ref 22–29)
Calcium: 8.9 mg/dL (ref 8.4–10.4)
Chloride: 108 mEq/L (ref 98–109)
Creatinine: 0.6 mg/dL (ref 0.6–1.1)
EGFR: >90 mL/min/{1.73_m2} (ref >90)
Glucose: 89 mg/dl (ref 70–140)
Potassium: 4 mEq/L (ref 3.5–5.1)
Sodium: 140 mEq/L (ref 136–145)
Total Bilirubin: <0.22 mg/dL (ref 0.20–1.20)
Total Protein: 6.3 g/dL — ABNORMAL LOW (ref 6.4–8.3)

## 2016-11-30 LAB — CBC WITH DIFFERENTIAL/PLATELET
BASO%: 0.3 % (ref 0.0–2.0)
Basophils Absolute: 0 10*3/uL (ref 0.0–0.1)
EOS%: 0.1 % (ref 0.0–7.0)
Eosinophils Absolute: 0 10*3/uL (ref 0.0–0.5)
HCT: 29.2 % — ABNORMAL LOW (ref 34.8–46.6)
HGB: 9.8 g/dL — ABNORMAL LOW (ref 11.6–15.9)
LYMPH%: 11.3 % — ABNORMAL LOW (ref 14.0–49.7)
MCH: 31.6 pg (ref 25.1–34.0)
MCHC: 33.6 g/dL (ref 31.5–36.0)
MCV: 93.9 fL (ref 79.5–101.0)
MONO#: 1 10*3/uL — ABNORMAL HIGH (ref 0.1–0.9)
MONO%: 12.2 % (ref 0.0–14.0)
NEUT#: 6.1 10*3/uL (ref 1.5–6.5)
NEUT%: 76.1 % (ref 38.4–76.8)
Platelets: 194 10*3/uL (ref 145–400)
RBC: 3.11 10*6/uL — ABNORMAL LOW (ref 3.70–5.45)
RDW: 14 % (ref 11.2–14.5)
WBC: 8 10*3/uL (ref 3.9–10.3)
lymph#: 0.9 10*3/uL (ref 0.9–3.3)

## 2016-11-30 MED ORDER — DOXORUBICIN HCL CHEMO IV INJECTION 2 MG/ML
50.0000 mg/m2 | Freq: Once | INTRAVENOUS | Status: AC
  Administered 2016-11-30: 100 mg via INTRAVENOUS
  Filled 2016-11-30: qty 50

## 2016-11-30 MED ORDER — SODIUM CHLORIDE 0.9% FLUSH
10.0000 mL | Freq: Once | INTRAVENOUS | Status: AC
  Administered 2016-11-30: 10 mL via INTRAVENOUS
  Filled 2016-11-30: qty 10

## 2016-11-30 MED ORDER — SODIUM CHLORIDE 0.9 % IV SOLN
Freq: Once | INTRAVENOUS | Status: AC
  Administered 2016-11-30: 15:00:00 via INTRAVENOUS
  Filled 2016-11-30: qty 5

## 2016-11-30 MED ORDER — SODIUM CHLORIDE 0.9 % IV SOLN
Freq: Once | INTRAVENOUS | Status: AC
  Administered 2016-11-30: 15:00:00 via INTRAVENOUS

## 2016-11-30 MED ORDER — HEPARIN SOD (PORK) LOCK FLUSH 100 UNIT/ML IV SOLN
500.0000 [IU] | Freq: Once | INTRAVENOUS | Status: AC | PRN
  Administered 2016-11-30: 500 [IU]
  Filled 2016-11-30: qty 5

## 2016-11-30 MED ORDER — SODIUM CHLORIDE 0.9 % IV SOLN
500.0000 mg/m2 | Freq: Once | INTRAVENOUS | Status: AC
  Administered 2016-11-30: 1000 mg via INTRAVENOUS
  Filled 2016-11-30: qty 50

## 2016-11-30 MED ORDER — PEGFILGRASTIM 6 MG/0.6ML ~~LOC~~ PSKT
6.0000 mg | PREFILLED_SYRINGE | Freq: Once | SUBCUTANEOUS | Status: AC
  Administered 2016-11-30: 6 mg via SUBCUTANEOUS
  Filled 2016-11-30: qty 0.6

## 2016-11-30 MED ORDER — PALONOSETRON HCL INJECTION 0.25 MG/5ML
INTRAVENOUS | Status: AC
  Filled 2016-11-30: qty 5

## 2016-11-30 MED ORDER — PALONOSETRON HCL INJECTION 0.25 MG/5ML
0.2500 mg | Freq: Once | INTRAVENOUS | Status: AC
  Administered 2016-11-30: 0.25 mg via INTRAVENOUS

## 2016-11-30 MED ORDER — SODIUM CHLORIDE 0.9% FLUSH
10.0000 mL | INTRAVENOUS | Status: DC | PRN
  Administered 2016-11-30: 10 mL
  Filled 2016-11-30: qty 10

## 2016-11-30 NOTE — Patient Instructions (Signed)
[facility] Discharge Instructions for Patients Receiving Chemotherapy  Today you received the following chemotherapy agents Adriamycin and Cytoxan.   To help prevent nausea and vomiting after your treatment, we encourage you to take your nausea medication as directed. BUT NO ZOFRAN FOR 3 DAYS AFTER CHEMO.   If you develop nausea and vomiting that is not controlled by your nausea medication, call the clinic.   BELOW ARE SYMPTOMS THAT SHOULD BE REPORTED IMMEDIATELY:  *FEVER GREATER THAN 100.5 F  *CHILLS WITH OR WITHOUT FEVER  NAUSEA AND VOMITING THAT IS NOT CONTROLLED WITH YOUR NAUSEA MEDICATION  *UNUSUAL SHORTNESS OF BREATH  *UNUSUAL BRUISING OR BLEEDING  TENDERNESS IN MOUTH AND THROAT WITH OR WITHOUT PRESENCE OF ULCERS  *URINARY PROBLEMS  *BOWEL PROBLEMS  UNUSUAL RASH Items with * indicate a potential emergency and should be followed up as soon as possible.  Feel free to call the clinic you have any questions or concerns. The clinic phone number is [phone].  Please show the CHEMO ALERT CARD at check-in to the Emergency Department and triage nurse.   

## 2016-11-30 NOTE — Progress Notes (Signed)
Patient Care Team: [name], MD as PCP - General (Family Medicine) Nicholas Lose, MD as Consulting Physician (Hematology and Oncology)  DIAGNOSIS:  Encounter Diagnosis  Name Primary?  . Malignant neoplasm of lower-outer quadrant of left breast of female, estrogen receptor positive ([place])     SUMMARY OF ONCOLOGIC HISTORY:   Malignant neoplasm of lower-outer quadrant of left breast of female, estrogen receptor positive ([place])   06/26/2016 Initial Diagnosis    Left breast biopsy 3:00: IDC with DCIS, grade 3, ER 80%, PR 20%, Ki-67 60%, HER-2 negative ratio 1.18; palpable lump: 1.8 cm lesion, no lymph nodes, T1 cN0 stage I a clinical stage      07/26/2016 Surgery    Left lumpectomy: IDC 1.8 cm, with DCIS, margins negative, 0/2 lymph nodes, ER 80%, PR 20%, HER-2 negative ratio 1.18, Ki-67 60%, T1cN0 stage IA       08/21/2016 Oncotype testing    Oncotype DX recurrence score 51, risk of recurrence 34%      08/21/2016 Genetic Testing    Genetic counseling and testing for hereditary cancer syndromes performed on 08/21/2016. Results are negative for pathogenic mutations in 46 genes analyzed by [employer] Common Hereditary Cancers Panel. Results are dated 09/14/2016. Genes tested: APC, ATM, AXIN2, BARD1, BMPR1A, BRCA1, BRCA2, BRIP1, CDH1, CDKN2A, CHEK2, CTNNA1, DICER1, EPCAM, GREM1, HOXB13, KIT, MEN1, MLH1, MSH2, MSH3, MSH6, MUTYH, NBN, NF1, NTHL1, PALB2, PDGFRA, PMS2, POLD1, POLE, PTEN, RAD50, RAD51C, RAD51D, SDHA, SDHB, SDHC, SDHD, SMAD4, SMARCA4, STK11, TP53, TSC1, TSC2, and VHL.         CHIEF COMPLIANT: Cycle 3 dose dense Adriamycin Cytoxan  INTERVAL HISTORY: [name] is a 56-year-old with above-mentioned history of left breast cancer treated with lumpectomy and is currently on adjuvant chemotherapy. Today is cycle 3 of dose dense Adriamycin and Cytoxan.patient feels extremely fatigued as a result of chemotherapy.  REVIEW OF SYSTEMS:  , Constitutional: Denies fevers, chills or  abnormal weight loss, complains of severe fatigue Eyes: Denies blurriness of vision Ears, nose, mouth, throat, and face: Denies mucositis or sore throat Respiratory: Denies cough, dyspnea or wheezes Cardiovascular: Denies palpitation, chest discomfort Gastrointestinal:  Denies nausea, heartburn or change in bowel habits Skin: Denies abnormal skin rashes Lymphatics: Denies new lymphadenopathy or easy bruising Neurological:Denies numbness, tingling or new weaknesses Behavioral/Psych: Mood is stable, no new changes  Extremities: No lower extremity edema  All other systems were reviewed with the patient and are negative.  I have reviewed the past medical history, past surgical history, social history and family history with the patient and they are unchanged from previous note.  ALLERGIES:  is allergic to aspirin; oxycodone hcl; propoxyphene n-acetaminophen; tramadol; adhesive [tape]; and prednisone.  MEDICATIONS:  Current Outpatient Prescriptions  Medication Sig Dispense Refill  . acetaminophen (TYLENOL) 500 MG tablet Take 1,000 mg by mouth every 6 (six) hours as needed (pain).    Marland Kitchen albuterol (PROAIR HFA) 108 (90 Base) MCG/ACT inhaler Inhale 2 puffs into the lungs every 6 (six) hours as needed for wheezing or shortness of breath.    . Calcium Citrate-Vitamin D (CALCIUM CITRATE +D PO) Take 2 tablets by mouth daily. Calcium 600 mg     . celecoxib (CELEBREX) 100 MG capsule Take 1 capsule (100 mg total) by mouth 2 (two) times daily. (Patient not taking: Reported on 11/02/2016) 20 capsule 0  . cyclobenzaprine (FLEXERIL) 5 MG tablet Take 5 mg by mouth 3 (three) times daily as needed for muscle spasms.    Marland Kitchen dexamethasone (DECADRON) 4 MG tablet  Take 1 tablet (4 mg total) by mouth daily. Take 1 tablets by mouth once a day on the day after chemotherapy for 2 days. Take with food. (Patient not taking: Reported on 11/02/2016) 10 tablet 0  . gabapentin (NEURONTIN) 300 MG capsule Take 300 mg by mouth 3  (three) times daily.    Marland Kitchen lidocaine-prilocaine (EMLA) cream Apply to affected area once 30 g 3  . lisinopril (PRINIVIL,ZESTRIL) 20 MG tablet Take 20 mg by mouth daily.    Marland Kitchen LORazepam (ATIVAN) 0.5 MG tablet Take 1 tablet (0.5 mg total) by mouth at bedtime. As needed only for sleep (Patient not taking: Reported on 11/02/2016) 30 tablet 0  . mometasone (ELOCON) 0.1 % cream Apply 1 application topically daily as needed (rash - summer eczema). Apply to arms    . Multiple Vitamin (MULTIVITAMIN WITH MINERALS) TABS tablet Take 1 tablet by mouth at bedtime.    . Multiple Vitamins-Iron (MULTI-VITAMIN/IRON PO) Take 1 tablet by mouth daily.    Marland Kitchen omeprazole (PRILOSEC) 40 MG capsule Take 1 capsule (40 mg total) by mouth daily. 30 capsule 5  . ondansetron (ZOFRAN) 8 MG tablet Take 1 tablet (8 mg total) by mouth 2 (two) times daily as needed. Start on the third day after chemotherapy. (Patient not taking: Reported on 11/02/2016) 30 tablet 1  . oxybutynin (DITROPAN) 5 MG tablet Take 1 tablet (5 mg total) by mouth 3 (three) times daily. 90 tablet 0  . prochlorperazine (COMPAZINE) 10 MG tablet Take 1 tablet (10 mg total) by mouth every 6 (six) hours as needed (Nausea or vomiting). (Patient not taking: Reported on 11/16/2016) 30 tablet 1  . ranitidine (ZANTAC) 300 MG capsule Take 1 capsule (300 mg total) by mouth 2 (two) times daily. 60 capsule 3  . silver sulfADIAZINE (SILVADENE) 1 % cream Apply 1 application topically 2 (two) times daily as needed (stomach tears). 50 g 0  . sucralfate (CARAFATE) 1 g tablet Take 1 tablet (1 g total) by mouth 4 (four) times daily -  with meals and at bedtime. 120 tablet 0   No current facility-administered medications for this visit.     PHYSICAL EXAMINATION: ECOG PERFORMANCE STATUS: 1 - Symptomatic but completely ambulatory  Vitals:   11/30/16 1337  BP: (!) 105/56  Pulse: 68  Resp: 18  Temp: 98 F (36.7 C)   Filed Weights   11/30/16 1337  Weight: 196 lb 9.6 oz (89.2 kg)     GENERAL:alert, no distress and comfortable SKIN: skin color, texture, turgor are normal, no rashes or significant lesions EYES: normal, Conjunctiva are pink and non-injected, sclera clear OROPHARYNX:no exudate, no erythema and lips, buccal mucosa, and tongue normal  NECK: supple, thyroid normal size, non-tender, without nodularity LYMPH:  no palpable lymphadenopathy in the cervical, axillary or inguinal LUNGS: clear to auscultation and percussion with normal breathing effort HEART: regular rate & rhythm and no murmurs and no lower extremity edema ABDOMEN:abdomen soft, non-tender and normal bowel sounds MUSCULOSKELETAL:no cyanosis of digits and no clubbing  NEURO: alert & oriented x 3 with fluent speech, no focal motor/sensory deficits EXTREMITIES: No lower extremity edema  LABORATORY DATA:  I have reviewed the data as listed   Chemistry      Component Value Date/Time   NA 136 11/23/2016 0831   K 4.2 11/23/2016 0831   CL 105 09/17/2016 1316   CO2 26 11/23/2016 0831   BUN 17.0 11/23/2016 0831   CREATININE 0.7 11/23/2016 0831      Component Value Date/Time  CALCIUM 9.7 11/23/2016 0831   ALKPHOS 118 11/23/2016 0831   AST 13 11/23/2016 0831   ALT 8 11/23/2016 0831   BILITOT 0.66 11/23/2016 0831       Lab Results  Component Value Date   WBC 1.7 (L) 11/23/2016   HGB 11.2 (L) 11/23/2016   HCT 33.6 (L) 11/23/2016   MCV 93.3 11/23/2016   PLT 205 11/23/2016   NEUTROABS 1.2 (L) 11/23/2016    ASSESSMENT & PLAN:  Malignant neoplasm of lower-outer quadrant of left 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 Adjuvant chemotherapy with Adriamycin/Cytoxan x 4, then weekly Taxol x 12 2. Adjuvant radiation  therapy followed by 3. Adjuvant antiestrogen therapy -------------------------------------------------------------------------------------------------------------------------------- Current treatment: Cycle 3 dose dense Adriamycin Cytoxan  Chemotherapy toxicities: 1. Alopecia  2. Fatigue 3. Abdominal discomfort 4. Thrush: Resolved with Diflucan  Will reduce the dosage of the current chemotherapy. Return to clinic in 2 weeks for cycle 4   I spent 25 minutes talking to the patient of which more than half was spent in counseling and coordination of care.  No orders of the defined types were placed in this encounter.  The patient has a good understanding of the overall plan. she agrees with it. she will call with any problems that may develop before the next visit here.   Rulon Eisenmenger, MD 11/30/16

## 2016-11-30 NOTE — Assessment & Plan Note (Signed)
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 Adjuvant chemotherapy with Adriamycin/Cytoxan x 4, then weekly Taxol x 12 2. Adjuvant radiation therapy followed by 3. Adjuvant antiestrogen therapy -------------------------------------------------------------------------------------------------------------------------------- Current treatment: Cycle 3 dose dense Adriamycin Cytoxan  Chemotherapy toxicities: 1. Alopecia  2. Fatigue 3. Abdominal discomfort 4. Thrush: Resolved with Diflucan  Will reduce the dosage of the current chemotherapy. Return to clinic in 2 weeks for cycle 4

## 2016-11-30 NOTE — Patient Instructions (Signed)
Implanted Port Home Guide An implanted port is a type of central line that is placed under the skin. Central lines are used to provide IV access when treatment or nutrition needs to be given through a person's veins. Implanted ports are used for long-term IV access. An implanted port may be placed because:  You need IV medicine that would be irritating to the small veins in your hands or arms.  You need long-term IV medicines, such as antibiotics.  You need IV nutrition for a long period.  You need frequent blood draws for lab tests.  You need dialysis.  Implanted ports are usually placed in the chest area, but they can also be placed in the upper arm, the abdomen, or the leg. An implanted port has two main parts:  Reservoir. The reservoir is round and will appear as a small, raised area under your skin. The reservoir is the part where a needle is inserted to give medicines or draw blood.  Catheter. The catheter is a thin, flexible tube that extends from the reservoir. The catheter is placed into a large vein. Medicine that is inserted into the reservoir goes into the catheter and then into the vein.  How will I care for my incision site? Do not get the incision site wet. Bathe or shower as directed by your health care provider. How is my port accessed? Special steps must be taken to access the port:  Before the port is accessed, a numbing cream can be placed on the skin. This helps numb the skin over the port site.  Your health care provider uses a sterile technique to access the port. ? Your health care provider must put on a mask and sterile gloves. ? The skin over your port is cleaned carefully with an antiseptic and allowed to dry. ? The port is gently pinched between sterile gloves, and a needle is inserted into the port.  Only "non-coring" port needles should be used to access the port. Once the port is accessed, a blood return should be checked. This helps ensure that the port  is in the vein and is not clogged.  If your port needs to remain accessed for a constant infusion, a clear (transparent) bandage will be placed over the needle site. The bandage and needle will need to be changed every week, or as directed by your health care provider.  Keep the bandage covering the needle clean and dry. Do not get it wet. Follow your health care provider's instructions on how to take a shower or bath while the port is accessed.  If your port does not need to stay accessed, no bandage is needed over the port.  What is flushing? Flushing helps keep the port from getting clogged. Follow your health care provider's instructions on how and when to flush the port. Ports are usually flushed with saline solution or a medicine called heparin. The need for flushing will depend on how the port is used.  If the port is used for intermittent medicines or blood draws, the port will need to be flushed: ? After medicines have been given. ? After blood has been drawn. ? As part of routine maintenance.  If a constant infusion is running, the port may not need to be flushed.  How long will my port stay implanted? The port can stay in for as long as your health care provider thinks it is needed. When it is time for the port to come out, surgery will be   done to remove it. The procedure is similar to the one performed when the port was put in. When should I seek immediate medical care? When you have an implanted port, you should seek immediate medical care if:  You notice a bad smell coming from the incision site.  You have swelling, redness, or drainage at the incision site.  You have more swelling or pain at the port site or the surrounding area.  You have a fever that is not controlled with medicine.  This information is not intended to replace advice given to you by your health care provider. Make sure you discuss any questions you have with your health care provider. Document  Released: 05/07/2005 Document Revised: 10/13/2015 Document Reviewed: 01/12/2013 Elsevier Interactive Patient Education  2017 [employer]  

## 2016-12-04 ENCOUNTER — Telehealth: Payer: Self-pay

## 2016-12-04 NOTE — Telephone Encounter (Signed)
Pt called to report feeling fatigue, diarrhea x1, and abdominal cramping. Pt states that she had received her 3rd cycle of AC last firday and Dr.Gudena reduced the dose due to her side effects. Pt states that she is still getting the same side effects as the last few cycles of AC. Pt states that she is eating some and trying to drink as much fluids as she can, but it is difficult with her abdominal cramping. Told pt to encourage herself to keep taking in fluids to keep herself hydrated and to monitor diarrhea symptoms. Pt denies any nausea or vomiting at this time. Advised pt to eat smaller meals throughout the day and to take imodium when she starts to have multiple loose stools. Told pt that she may take over the counter pepcid or gasx to help with abdominal cramping. Told pt to go to ED if symptoms worsen, especially after hours or if pt develops sudden bleeding, n/v, blood in the stool, or if pt unable to keep any food or drink down. Pt verbalized understanding and will monitor symptoms closely. Told pt that side effects should improve over the next few days.

## 2016-12-13 ENCOUNTER — Encounter (HOSPITAL_COMMUNITY): Payer: Self-pay

## 2016-12-13 ENCOUNTER — Emergency Department (HOSPITAL_COMMUNITY)
Admission: EM | Admit: 2016-12-13 | Discharge: 2016-12-14 | Disposition: A | Discharge status: Home / Self Care | Payer: Medicare Other | Attending: Emergency Medicine | Admitting: Emergency Medicine

## 2016-12-13 DIAGNOSIS — Z86711 Personal history of pulmonary embolism: Secondary | ICD-10-CM | POA: Insufficient documentation

## 2016-12-13 DIAGNOSIS — Z170 Estrogen receptor positive status [ER+]: Secondary | ICD-10-CM | POA: Insufficient documentation

## 2016-12-13 DIAGNOSIS — K1121 Acute sialoadenitis: Secondary | ICD-10-CM | POA: Diagnosis not present

## 2016-12-13 DIAGNOSIS — R220 Localized swelling, mass and lump, head: Secondary | ICD-10-CM | POA: Diagnosis not present

## 2016-12-13 DIAGNOSIS — Z79899 Other long term (current) drug therapy: Secondary | ICD-10-CM | POA: Diagnosis not present

## 2016-12-13 DIAGNOSIS — C50512 Malignant neoplasm of lower-outer quadrant of left female breast: Secondary | ICD-10-CM | POA: Diagnosis not present

## 2016-12-13 DIAGNOSIS — K1120 Sialoadenitis, unspecified: Secondary | ICD-10-CM

## 2016-12-13 DIAGNOSIS — I10 Essential (primary) hypertension: Secondary | ICD-10-CM | POA: Diagnosis not present

## 2016-12-13 DIAGNOSIS — Z86718 Personal history of other venous thrombosis and embolism: Secondary | ICD-10-CM | POA: Diagnosis not present

## 2016-12-13 DIAGNOSIS — D649 Anemia, unspecified: Secondary | ICD-10-CM | POA: Insufficient documentation

## 2016-12-13 DIAGNOSIS — Z87891 Personal history of nicotine dependence: Secondary | ICD-10-CM | POA: Diagnosis not present

## 2016-12-13 NOTE — ED Triage Notes (Signed)
Pt presents with left sided neck and  jaw swelling that began 1 hours ago. Pt states the left side of her face is sore 5/10. Pt left side of face and jaw has swelling.

## 2016-12-14 ENCOUNTER — Other Ambulatory Visit: Payer: Medicare Other

## 2016-12-14 ENCOUNTER — Telehealth: Payer: Self-pay | Admitting: *Deleted

## 2016-12-14 ENCOUNTER — Encounter (HOSPITAL_COMMUNITY): Payer: Self-pay

## 2016-12-14 ENCOUNTER — Ambulatory Visit: Payer: Medicare Other

## 2016-12-14 ENCOUNTER — Ambulatory Visit: Payer: Medicare Other | Admitting: Hematology and Oncology

## 2016-12-14 ENCOUNTER — Emergency Department (HOSPITAL_COMMUNITY): Payer: Medicare Other

## 2016-12-14 DIAGNOSIS — R220 Localized swelling, mass and lump, head: Secondary | ICD-10-CM | POA: Diagnosis not present

## 2016-12-14 LAB — CBC WITH DIFFERENTIAL/PLATELET
Basophils Absolute: 0.1 10*3/uL (ref 0.0–0.1)
Basophils Relative: 1 %
Eosinophils Absolute: 0 10*3/uL (ref 0.0–0.7)
Eosinophils Relative: 0 %
HCT: 28.6 % — ABNORMAL LOW (ref 36.0–46.0)
Hemoglobin: 9.7 g/dL — ABNORMAL LOW (ref 12.0–15.0)
Lymphocytes Relative: 13 %
Lymphs Abs: 1 10*3/uL (ref 0.7–4.0)
MCH: 31 pg (ref 26.0–34.0)
MCHC: 33.9 g/dL (ref 30.0–36.0)
MCV: 91.4 fL (ref 78.0–100.0)
Monocytes Absolute: 0.9 10*3/uL (ref 0.1–1.0)
Monocytes Relative: 13 %
Neutro Abs: 5.4 10*3/uL (ref 1.7–7.7)
Neutrophils Relative %: 73 %
Platelets: 261 10*3/uL (ref 150–400)
RBC: 3.13 MIL/uL — ABNORMAL LOW (ref 3.87–5.11)
RDW: 16.3 % — ABNORMAL HIGH (ref 11.5–15.5)
WBC: 7.5 10*3/uL (ref 4.0–10.5)

## 2016-12-14 LAB — BASIC METABOLIC PANEL
Anion gap: 8 (ref 5–15)
BUN: 12 mg/dL (ref 6–20)
CO2: 27 mmol/L (ref 22–32)
Calcium: 9.4 mg/dL (ref 8.9–10.3)
Chloride: 104 mmol/L (ref 101–111)
Creatinine, Ser: 0.69 mg/dL (ref 0.44–1.00)
GFR calc Af Amer: >60 mL/min (ref >60)
GFR calc non Af Amer: >60 mL/min (ref >60)
Glucose, Bld: 97 mg/dL (ref 65–99)
Potassium: 3.6 mmol/L (ref 3.5–5.1)
Sodium: 139 mmol/L (ref 135–145)

## 2016-12-14 LAB — SEDIMENTATION RATE: Sed Rate: 15 mm/hr (ref 0–22)

## 2016-12-14 MED ORDER — CLINDAMYCIN PHOSPHATE 600 MG/50ML IV SOLN
600.0000 mg | Freq: Once | INTRAVENOUS | Status: AC
  Administered 2016-12-14: 600 mg via INTRAVENOUS
  Filled 2016-12-14: qty 50

## 2016-12-14 MED ORDER — IOPAMIDOL (ISOVUE-300) INJECTION 61%
INTRAVENOUS | Status: AC
  Administered 2016-12-14: 75 mL via INTRAVENOUS
  Filled 2016-12-14: qty 75

## 2016-12-14 MED ORDER — IOPAMIDOL (ISOVUE-300) INJECTION 61%
75.0000 mL | Freq: Once | INTRAVENOUS | Status: AC | PRN
  Administered 2016-12-14: 75 mL via INTRAVENOUS

## 2016-12-14 MED ORDER — CLINDAMYCIN HCL 300 MG PO CAPS: 300.0000 mg | ORAL_CAPSULE | Freq: Four times a day (QID) | ORAL | 0 refills | Status: DC

## 2016-12-14 MED ORDER — ONDANSETRON HCL 4 MG/2ML IJ SOLN
4.0000 mg | Freq: Once | INTRAMUSCULAR | Status: AC
  Administered 2016-12-14: 4 mg via INTRAVENOUS
  Filled 2016-12-14: qty 2

## 2016-12-14 MED ORDER — ACETAMINOPHEN-CODEINE #3 300-30 MG PO TABS: 1.0000 | ORAL_TABLET | Freq: Four times a day (QID) | ORAL | 0 refills | Status: DC | PRN

## 2016-12-14 MED ORDER — MORPHINE SULFATE (PF) 2 MG/ML IV SOLN
4.0000 mg | Freq: Once | INTRAVENOUS | Status: AC
  Administered 2016-12-14: 4 mg via INTRAVENOUS
  Filled 2016-12-14: qty 2

## 2016-12-14 MED FILL — ACETAMINOPHEN/COD #3 TABLET: 300-30 | 5 days supply | Qty: 20 | Fill #0

## 2016-12-14 MED FILL — CLINDAMYCIN HCL 300 MG CAP: 300 | 7 days supply | Qty: 28 | Fill #0

## 2016-12-14 NOTE — ED Provider Notes (Signed)
[facility] DEPT Provider Note   CSN: [id number] Arrival date & time: 12/13/16  2340     History   Chief Complaint Chief Complaint  Patient presents with  . Facial Swelling    HPI BILLEE BALCERZAK is a 56 y.o. female.  The history is provided by the patient.  She has a history of breast cancer and is currently getting chemotherapy. Also history of stroke and pulmonary embolism. She noted onset this evening pain and swelling in the left side of her neck and left side of her jaw. Pain is rated at 7/10. She denies fever or chills. Pain is worse with movement and palpation. She denies any recent trauma. Last chemotherapy infusion was 2 weeks ago.  Past Medical History:  Diagnosis Date  . Anemia    since bypass  . Arthritis    osteoarthritis  . Asthma    states no asthma attack since 2002  . Breast cancer ([place]) 06/26/16 bx   left breast  . Chronic back pain   . Complication of anesthesia    states takes more than normal to put her to sleep  . Dental bridge present    upper  . Dental crowns present   . DVT of upper extremity (deep vein thrombosis) ([place])   . Fibromyalgia   . Genetic testing 09/19/2016   Ms. Trammel underwent genetic counseling and testing for hereditary cancer syndromes on 08/21/2016. Her results were negative for mutations in all 46 genes analyzed by [facility]'s 46-gene Common Hereditary Cancers Panel. Genes analyzed include: APC, ATM, AXIN2, BARD1, BMPR1A, BRCA1, BRCA2, BRIP1, CDH1, CDKN2A, CHEK2, CTNNA1, DICER1, EPCAM, GREM1, HOXB13, KIT, MEN1, MLH1, MSH2, MSH3, MSH6, MUTYH, NBN,   . Headache(784.0)    migraines  . History of blood transfusion 06/2005  . History of gallstones   . History of pneumonia   . History of shingles 07/2011  . HTN (hypertension)   . Normal coronary arteries 2003  . PFO (patent foramen ovale)   . Presence of inferior vena cava filter   . Pulmonary embolism ([place])   . Sleep apnea    used CPAP until after bypass surg.  . Status post  gastric bypass for obesity   . Stroke [facility]) 12/2002   right-sided weakness  . Urinary incontinence     Patient Active Problem List   Diagnosis Date Noted  . Port catheter in place 11/02/2016  . Genetic testing 09/19/2016  . Pre-operative clearance 07/23/2016  . Malignant neoplasm of lower-outer quadrant of left breast of female, estrogen receptor positive ([place]) 07/10/2016  . Essential hypertension   . Chest pain 09/30/2015  . DVT, lower extremity ([place]) 06/18/2011  . DVT of upper extremity (deep vein thrombosis) ([place]) 06/18/2011  . [place] filter in place 06/18/2011  . History of stroke 06/18/2011  . PFO (patent foramen ovale) 06/18/2011  . Status post gastric bypass for obesity 06/18/2011  . PELVIC PAIN, CHRONIC 07/20/2008  . FIBROIDS, UTERUS 07/16/2008  . ASTHMA 07/16/2008  . FIBROMYALGIA 07/16/2008  . CARPAL TUNNEL SYNDROME, HX OF 07/16/2008  . History of pulmonary embolus ([place]) 07/16/2008    Past Surgical History:  Procedure Laterality Date  . ABDOMINAL HYSTERECTOMY  11/1997   complete  . ANTERIOR CERVICAL DECOMP/DISCECTOMY FUSION  02/05/2005   C5-6  . APPENDECTOMY  10/22/2008   laparoscopic  . BREAST LUMPECTOMY WITH RADIOACTIVE SEED AND SENTINEL LYMPH NODE BIOPSY Left 07/26/2016   Procedure: BREAST LUMPECTOMY WITH RADIOACTIVE SEED AND SENTINEL LYMPH NODE BIOPSY;  Surgeon: Erroll Luna, MD;  Location: [facility];  Service: General;  Laterality: Left;  . BUNIONECTOMY  05/1980   both feet  . BUNIONECTOMY  08/2011   left foot  . CARDIAC CATHETERIZATION  03/04/2002  . CARPAL TUNNEL RELEASE  06/21/2009   right  . CARPAL TUNNEL RELEASE     left hand  . CARPAL TUNNEL RELEASE  10/03/2011   Procedure: CARPAL TUNNEL RELEASE;  Surgeon: Wynonia Sours, MD;  Location: [facility];  Service: Orthopedics;  Laterality: Right;  CARPAL TUNNEL WITH HYPOTHENAR FAT PAD TRANSFER  . CERVICAL SPINE SURGERY  01/2005   titanium plate implanted  . CHOLECYSTECTOMY  1990    . ELBOW SURGERY  08/09/2004   decompression ulnar nerve right elbow  . ENTEROLYSIS  10/22/2008   laparoscopic abd. enterolysis  . GASTRIC ROUX-EN-Y  2009  . HEEL SPUR SURGERY  08/1997   left  . HEMORRHOID SURGERY  03/1993  . LAPAROSCOPIC LYSIS INTESTINAL ADHESIONS  02/14/2000  . NAILBED REPAIR  01/10/2005; 08/2011   exc. matrix bilat. great toe  . OTHER SURGICAL HISTORY  12/1986   pt states that she had surgery to unclog her fallopean tubes  . PORTACATH PLACEMENT N/A 09/24/2016   Procedure: INSERTION PORT-A-CATH WITH [name];  Surgeon: Erroll Luna, MD;  Location: [facility];  Service: General;  Laterality: N/A;  . SHOULDER SURGERY     bilat. - (left:  06/2005)  . TONSILLECTOMY  07/1995  . TRIGGER FINGER RELEASE  04/25/2006   decompression A-1 pulley left thumb  . UTERINE FIBROID SURGERY  12/95, 7/96   x2  . VENA CAVA FILTER PLACEMENT  2009   during Roux-en-Y surg.    OB History    No data available       Home Medications    Prior to Admission medications   Medication Sig Start Date End Date Taking? Authorizing Provider  acetaminophen (TYLENOL) 500 MG tablet Take 1,000 mg by mouth every 6 (six) hours as needed (pain).   Yes [provider]  albuterol (PROAIR HFA) 108 (90 Base) MCG/ACT inhaler Inhale 2 puffs into the lungs every 6 (six) hours as needed for wheezing or shortness of breath.   Yes [provider]  Calcium Citrate-Vitamin D (CALCIUM CITRATE +D PO) Take 2 tablets by mouth daily. Calcium 600 mg    Yes [provider]  cyclobenzaprine (FLEXERIL) 5 MG tablet Take 5 mg by mouth 3 (three) times daily as needed for muscle spasms.   Yes [provider]  gabapentin (NEURONTIN) 300 MG capsule Take 300 mg by mouth 3 (three) times daily.   Yes [provider]  lidocaine-prilocaine (EMLA) cream Apply to affected area once 09/17/16  Yes Nicholas Lose, MD  lisinopril (PRINIVIL,ZESTRIL) 20 MG tablet Take 20 mg by mouth daily.   Yes [provider]  mometasone (ELOCON) 0.1 % cream Apply 1 application topically daily as needed (rash - summer eczema). Apply to arms   Yes [provider]  Multiple Vitamin (MULTIVITAMIN WITH MINERALS) TABS tablet Take 1 tablet by mouth at bedtime.   Yes [provider]  Multiple Vitamins-Iron (MULTI-VITAMIN/IRON PO) Take 1 tablet by mouth daily.   Yes [provider]  omeprazole (PRILOSEC) 40 MG capsule Take 1 capsule (40 mg total) by mouth daily. 11/23/16  Yes Causey, Charlestine Massed, NP  oxybutynin (DITROPAN) 5 MG tablet Take 1 tablet (5 mg total) by mouth 3 (three) times daily. 11/23/16  Yes Causey, Charlestine Massed, NP  prochlorperazine (COMPAZINE) 10 MG  tablet Take 1 tablet (10 mg total) by mouth every 6 (six) hours as needed (Nausea or vomiting). 09/17/16  Yes Nicholas Lose, MD  ranitidine (ZANTAC) 300 MG capsule Take 1 capsule (300 mg total) by mouth 2 (two) times daily. 11/14/16  Yes Causey, Charlestine Massed, NP  silver sulfADIAZINE (SILVADENE) 1 % cream Apply 1 application topically 2 (two) times daily as needed (stomach tears). 11/09/16  Yes Causey, Charlestine Massed, NP  sucralfate (CARAFATE) 1 g tablet Take 1 tablet (1 g total) by mouth 4 (four) times daily -  with meals and at bedtime. 11/23/16  Yes Causey, Charlestine Massed, NP  celecoxib (CELEBREX) 100 MG capsule Take 1 capsule (100 mg total) by mouth 2 (two) times daily. Patient not taking: Reported on 11/02/2016 09/24/16   Erroll Luna, MD  dexamethasone (DECADRON) 4 MG tablet Take 1 tablet (4 mg total) by mouth daily. Take 1 tablets by mouth once a day on the day after chemotherapy for 2 days. Take with food. Patient not taking: Reported on 11/02/2016 09/17/16   Nicholas Lose, MD  LORazepam (ATIVAN) 0.5 MG tablet Take 1 tablet (0.5 mg total) by mouth at bedtime. As needed only for sleep Patient not taking: Reported on 11/02/2016 09/17/16   Nicholas Lose, MD  ondansetron (ZOFRAN) 8 MG tablet Take 1 tablet (8 mg total) by mouth 2  (two) times daily as needed. Start on the third day after chemotherapy. Patient not taking: Reported on 11/02/2016 09/17/16   Nicholas Lose, MD    Family History Family History  Problem Relation Age of Onset  . Breast cancer Paternal Aunt 2  . Cervical cancer Paternal Grandmother 44       d.40s  . Ovarian cancer Maternal Grandmother 23       d.23  . Colon polyps Father   . Diabetes Father        borderline  . Prostate cancer Father   . Hypertension Mother   . Hypertension Unknown   . Breast cancer Sister 65       treated with neoadjuvant chemo/radiation and lumpectomy    Social History Social History  Substance Use Topics  . Smoking status: Former Research scientist (life sciences)  . Smokeless tobacco: Never Used     Comment: quit smoking 08/1989  . Alcohol use No     Allergies   Aspirin; Oxycodone hcl; Propoxyphene n-acetaminophen; Tramadol; Adhesive [tape]; and Prednisone   Review of Systems Review of Systems  All other systems reviewed and are negative.    Physical Exam Updated Vital Signs BP 121/63 (BP Location: Left Arm)   Pulse 62   Temp 98.4 F (36.9 C) (Oral)   Resp 18   SpO2 100%   Physical Exam  Nursing note and vitals reviewed.  56 year old female, resting comfortably and in no acute distress. Vital signs are normal. Oxygen saturation is 100%, which is normal. Head is normocephalic and atraumatic. PERRLA, EOMI. Oropharynx is clear. Mild to moderate swelling is noted along the left side of the face from the angle of the mandible up to the preauricular area. Swelling does extend into the left side of the neck. There is no erythema or warmth or induration. Area is tender to palpation. Neck is nontender and supple without adenopathy or JVD. Swelling as noted above. Back is nontender and there is no CVA tenderness. Lungs are clear without rales, wheezes, or rhonchi. Chest is nontender. Mediport is present in the right anterior chest. Heart has regular rate and rhythm without  murmur. Abdomen  is soft, flat, nontender without masses or hepatosplenomegaly and peristalsis is normoactive. Extremities have no cyanosis or edema, full range of motion is present. Skin is warm and dry without rash. Neurologic: Mental status is normal, cranial nerves are intact, there are no motor or sensory deficits.  ED Treatments / Results  Labs (all labs ordered are listed, but only abnormal results are displayed) Labs Reviewed  CBC WITH DIFFERENTIAL/PLATELET - Abnormal; Notable for the following:       Result Value   RBC 3.13 (*)    Hemoglobin 9.7 (*)    HCT 28.6 (*)    RDW 16.3 (*)    All other components within normal limits  BASIC METABOLIC PANEL  SEDIMENTATION RATE   Radiology Ct Maxillofacial W Contrast  Result Date: 12/14/2016 CLINICAL DATA:  56 y/o F; left facial swelling going down the neck for 3-4 hours. EXAM: CT MAXILLOFACIAL WITH CONTRAST TECHNIQUE: Multidetector CT imaging of the maxillofacial structures was performed with intravenous contrast. Multiplanar CT image reconstructions were also generated. CONTRAST:  75 cc Isovue-300 COMPARISON:  None. FINDINGS: Osseous: No fracture or mandibular dislocation. No destructive process. Orbits: Negative. No traumatic or inflammatory finding. Sinuses: Clear. Soft tissues: There is thickening of the left superficial parotid fascia and stranding within left-sided subcutaneous fat. Additionally there is mild asymmetric enhancement of the left parotid gland. Findings probably represent acute left-sided parotiditis. No dilatation or obstructing mass/ stone of the parotid duct is identified. Limited intracranial: No significant or unexpected finding. IMPRESSION: Inflammatory changes of left parotid gland and superficial soft tissues compatible with acute parotiditis. No dilatation or obstructing mass/ stone of the parotid duct. No abscess. Electronically Signed   By: Kristine Garbe M.D.   On: 12/14/2016 03:52     Procedures Procedures (including critical care time)  Medications Ordered in ED Medications  clindamycin (CLEOCIN) IVPB 600 mg (not administered)  morphine 2 MG/ML injection 4 mg (not administered)  morphine 2 MG/ML injection 4 mg (4 mg Intravenous Given 12/14/16 0127)  ondansetron (ZOFRAN) injection 4 mg (4 mg Intravenous Given 12/14/16 0127)  iopamidol (ISOVUE-300) 61 % injection 75 mL (75 mLs Intravenous Contrast Given 12/14/16 0256)     Initial Impression / Assessment and Plan / ED Course  I have reviewed the triage vital signs and the nursing notes.  Pertinent labs & imaging results that were available during my care of the patient were reviewed by me and considered in my medical decision making (see chart for details).  Swelling of the face and neck of uncertain cause. This is not have the appearance of angioedema. Old records reviewed confirming chemotherapy for breast cancer - Palonosetron, fosaprepitant, doxorubicin, cyclophosphamide, pegfilgrastim.  CT shows evidence of parotitis with no sign of abscess. Noted workup is unremarkable. WBC is adequate with no left shift, anemia is present and unchanged from baseline, sedimentation rate is normal. She is due for chemotherapy this afternoon. I discussed the case with Dr. Jana Hakim, who recommends delaying chemotherapy for 1 week. She is given clindamycin intravenously and will be sent home with prescription for clindamycin and acetaminophen with codeine. Referred to ENT for follow-up.  Final Clinical Impressions(s) / ED Diagnoses   Final diagnoses:  Parotitis, acute  Normochromic normocytic anemia  Malignant neoplasm of lower-outer quadrant of left breast of female, estrogen receptor positive (HCC)    New Prescriptions New Prescriptions   ACETAMINOPHEN-CODEINE (TYLENOL #3) 300-30 MG TABLET    Take 1 tablet by mouth every 6 (six) hours as needed for moderate pain.  CLINDAMYCIN (CLEOCIN) 300 MG CAPSULE    Take 1 capsule (300  mg total) by mouth 4 (four) times daily. X 7 days     Delora Fuel, MD 20/60/15 [phone]

## 2016-12-14 NOTE — Discharge Instructions (Signed)
Return if symptoms are getting worse. °

## 2016-12-14 NOTE — Telephone Encounter (Signed)
Noted pt in ED for facial swelling. Per Dr. Lindi Adie hold chemo x1 wk. Cancel chemo and appts for 7/27. Called pt and left detailed msg for no appts on 7/27. Lab and Dr. Lindi Adie appt on 7/30 and chemo on 8/3. Contact information provided.

## 2016-12-17 ENCOUNTER — Ambulatory Visit (HOSPITAL_BASED_OUTPATIENT_CLINIC_OR_DEPARTMENT_OTHER): Payer: Medicare Other | Admitting: Hematology and Oncology

## 2016-12-17 ENCOUNTER — Encounter: Payer: Self-pay | Admitting: Hematology and Oncology

## 2016-12-17 ENCOUNTER — Other Ambulatory Visit (HOSPITAL_BASED_OUTPATIENT_CLINIC_OR_DEPARTMENT_OTHER): Payer: Medicare Other

## 2016-12-17 DIAGNOSIS — C50512 Malignant neoplasm of lower-outer quadrant of left female breast: Secondary | ICD-10-CM

## 2016-12-17 DIAGNOSIS — Z170 Estrogen receptor positive status [ER+]: Secondary | ICD-10-CM

## 2016-12-17 LAB — COMPREHENSIVE METABOLIC PANEL
ALT: 8 U/L (ref 0–55)
AST: 15 U/L (ref 5–34)
Albumin: 3.8 g/dL (ref 3.5–5.0)
Alkaline Phosphatase: 79 U/L (ref 40–150)
Anion Gap: 7 mEq/L (ref 3–11)
BUN: 10.8 mg/dL (ref 7.0–26.0)
CO2: 27 mEq/L (ref 22–29)
Calcium: 9.3 mg/dL (ref 8.4–10.4)
Chloride: 106 mEq/L (ref 98–109)
Creatinine: 0.7 mg/dL (ref 0.6–1.1)
EGFR: >90 mL/min/{1.73_m2} (ref >90)
Glucose: 91 mg/dl (ref 70–140)
Potassium: 3.9 mEq/L (ref 3.5–5.1)
Sodium: 139 mEq/L (ref 136–145)
Total Bilirubin: 0.34 mg/dL (ref 0.20–1.20)
Total Protein: 6.7 g/dL (ref 6.4–8.3)

## 2016-12-17 LAB — CBC WITH DIFFERENTIAL/PLATELET
BASO%: 0.6 % (ref 0.0–2.0)
Basophils Absolute: 0 10*3/uL (ref 0.0–0.1)
EOS%: 0.7 % (ref 0.0–7.0)
Eosinophils Absolute: 0 10*3/uL (ref 0.0–0.5)
HCT: 30.2 % — ABNORMAL LOW (ref 34.8–46.6)
HGB: 9.9 g/dL — ABNORMAL LOW (ref 11.6–15.9)
LYMPH%: 14 % (ref 14.0–49.7)
MCH: 31.2 pg (ref 25.1–34.0)
MCHC: 32.8 g/dL (ref 31.5–36.0)
MCV: 95.3 fL (ref 79.5–101.0)
MONO#: 0.7 10*3/uL (ref 0.1–0.9)
MONO%: 12.8 % (ref 0.0–14.0)
NEUT#: 3.9 10*3/uL (ref 1.5–6.5)
NEUT%: 71.9 % (ref 38.4–76.8)
Platelets: 285 10*3/uL (ref 145–400)
RBC: 3.17 10*6/uL — ABNORMAL LOW (ref 3.70–5.45)
RDW: 16.5 % — ABNORMAL HIGH (ref 11.2–14.5)
WBC: 5.4 10*3/uL (ref 3.9–10.3)
lymph#: 0.8 10*3/uL — ABNORMAL LOW (ref 0.9–3.3)

## 2016-12-17 MED ORDER — CLINDAMYCIN HCL 300 MG PO CAPS: 300.0000 mg | ORAL_CAPSULE | Freq: Four times a day (QID) | ORAL | 0 refills | Status: DC

## 2016-12-17 NOTE — Progress Notes (Signed)
Patient Care Team: [name], MD as PCP - General (Family Medicine) Nicholas Lose, MD as Consulting Physician (Hematology and Oncology)  DIAGNOSIS:  Encounter Diagnosis  Name Primary?  . Malignant neoplasm of lower-outer quadrant of left breast of female, estrogen receptor positive ([place])     SUMMARY OF ONCOLOGIC HISTORY:   Malignant neoplasm of lower-outer quadrant of left breast of female, estrogen receptor positive ([place])   06/26/2016 Initial Diagnosis    Left breast biopsy 3:00: IDC with DCIS, grade 3, ER 80%, PR 20%, Ki-67 60%, HER-2 negative ratio 1.18; palpable lump: 1.8 cm lesion, no lymph nodes, T1 cN0 stage I a clinical stage      07/26/2016 Surgery    Left lumpectomy: IDC 1.8 cm, with DCIS, margins negative, 0/2 lymph nodes, ER 80%, PR 20%, HER-2 negative ratio 1.18, Ki-67 60%, T1cN0 stage IA       08/21/2016 Oncotype testing    Oncotype DX recurrence score 51, risk of recurrence 34%      08/21/2016 Genetic Testing    Genetic counseling and testing for hereditary cancer syndromes performed on 08/21/2016. Results are negative for pathogenic mutations in 46 genes analyzed by [facility]'s Common Hereditary Cancers Panel. Results are dated 09/14/2016. Genes tested: APC, ATM, AXIN2, BARD1, BMPR1A, BRCA1, BRCA2, BRIP1, CDH1, CDKN2A, CHEK2, CTNNA1, DICER1, EPCAM, GREM1, HOXB13, KIT, MEN1, MLH1, MSH2, MSH3, MSH6, MUTYH, NBN, NF1, NTHL1, PALB2, PDGFRA, PMS2, POLD1, POLE, PTEN, RAD50, RAD51C, RAD51D, SDHA, SDHB, SDHC, SDHD, SMAD4, SMARCA4, STK11, TP53, TSC1, TSC2, and VHL.        11/02/2016 -  Chemotherapy    Dose dense Adriamycin and Cytoxan 4 followed by Taxol weekly 12       CHIEF COMPLIANT: Holding chemotherapy for recent infection of the parotid gland  INTERVAL HISTORY: [name] is a 56 year old with above-mentioned history of left breast cancer currently on adjuvant chemotherapy. After cycle 3 of chemotherapy patient had severe pain in both her ears. It turns out  that on the CT scan she was found to have parotitis. She was put on clindamycin antibiotic and was asked to see [name] today. She is scheduled for chemotherapy this Friday. This will be postponed until the following Tuesday.  REVIEW OF SYSTEMS:   Constitutional: Denies fevers, chills or abnormal weight loss Eyes: Denies blurriness of vision Ears, nose, mouth, throat, and face: Parotitis with bilateral ear pain and throat pain Respiratory: Denies cough, dyspnea or wheezes Cardiovascular: Denies palpitation, chest discomfort Gastrointestinal:  Denies nausea, heartburn or change in bowel habits Skin: Denies abnormal skin rashes Lymphatics: Denies new lymphadenopathy or easy bruising Neurological:Denies numbness, tingling or new weaknesses Behavioral/Psych: Mood is stable, no new changes  Extremities: No lower extremity edema  All other systems were reviewed with the patient and are negative.  I have reviewed the past medical history, past surgical history, social history and family history with the patient and they are unchanged from previous note.  ALLERGIES:  is allergic to aspirin; oxycodone hcl; propoxyphene n-acetaminophen; tramadol; adhesive [tape]; and prednisone.  MEDICATIONS:  Current Outpatient Prescriptions  Medication Sig Dispense Refill  . acetaminophen (TYLENOL) 500 MG tablet Take 1,000 mg by mouth every 6 (six) hours as needed (pain).    Marland Kitchen acetaminophen-codeine (TYLENOL #3) 300-30 MG tablet Take 1 tablet by mouth every 6 (six) hours as needed for moderate pain. 20 tablet 0  . albuterol (PROAIR HFA) 108 (90 Base) MCG/ACT inhaler Inhale 2 puffs into the lungs every 6 (six) hours as needed for wheezing or shortness of  breath.    . Calcium Citrate-Vitamin D (CALCIUM CITRATE +D PO) Take 2 tablets by mouth daily. Calcium 600 mg     . clindamycin (CLEOCIN) 300 MG capsule Take 1 capsule (300 mg total) by mouth 4 (four) times daily. X 7 days 28 capsule 0  . cyclobenzaprine (FLEXERIL) 5 MG  tablet Take 5 mg by mouth 3 (three) times daily as needed for muscle spasms.    Marland Kitchen gabapentin (NEURONTIN) 300 MG capsule Take 300 mg by mouth 3 (three) times daily.    Marland Kitchen lidocaine-prilocaine (EMLA) cream Apply to affected area once 30 g 3  . lisinopril (PRINIVIL,ZESTRIL) 20 MG tablet Take 20 mg by mouth daily.    . mometasone (ELOCON) 0.1 % cream Apply 1 application topically daily as needed (rash - summer eczema). Apply to arms    . Multiple Vitamin (MULTIVITAMIN WITH MINERALS) TABS tablet Take 1 tablet by mouth at bedtime.    . Multiple Vitamins-Iron (MULTI-VITAMIN/IRON PO) Take 1 tablet by mouth daily.    Marland Kitchen omeprazole (PRILOSEC) 40 MG capsule Take 1 capsule (40 mg total) by mouth daily. 30 capsule 5  . ondansetron (ZOFRAN) 8 MG tablet Take 1 tablet (8 mg total) by mouth 2 (two) times daily as needed. Start on the third day after chemotherapy. (Patient not taking: Reported on 11/02/2016) 30 tablet 1  . oxybutynin (DITROPAN) 5 MG tablet Take 1 tablet (5 mg total) by mouth 3 (three) times daily. 90 tablet 0  . prochlorperazine (COMPAZINE) 10 MG tablet Take 1 tablet (10 mg total) by mouth every 6 (six) hours as needed (Nausea or vomiting). 30 tablet 1  . ranitidine (ZANTAC) 300 MG capsule Take 1 capsule (300 mg total) by mouth 2 (two) times daily. 60 capsule 3  . silver sulfADIAZINE (SILVADENE) 1 % cream Apply 1 application topically 2 (two) times daily as needed (stomach tears). 50 g 0  . sucralfate (CARAFATE) 1 g tablet Take 1 tablet (1 g total) by mouth 4 (four) times daily -  with meals and at bedtime. 120 tablet 0   No current facility-administered medications for this visit.     PHYSICAL EXAMINATION: ECOG PERFORMANCE STATUS: 1 - Symptomatic but completely ambulatory  Vitals:   12/17/16 1046  BP: 108/65  Pulse: (!) 55  Resp: 18  Temp: 98.4 F (36.9 C)   Filed Weights   12/17/16 1046  Weight: 194 lb 8 oz (88.2 kg)    GENERAL:alert, no distress and comfortable SKIN: skin color,  texture, turgor are normal, no rashes or significant lesions EYES: normal, Conjunctiva are pink and non-injected, sclera clear OROPHARYNX:Fullness in bilateral ears  NECK: supple, thyroid normal size, non-tender, without nodularity LYMPH:  no palpable lymphadenopathy in the cervical, axillary or inguinal LUNGS: clear to auscultation and percussion with normal breathing effort HEART: regular rate & rhythm and no murmurs and no lower extremity edema ABDOMEN:abdomen soft, non-tender and normal bowel sounds MUSCULOSKELETAL:no cyanosis of digits and no clubbing  NEURO: alert & oriented x 3 with fluent speech, no focal motor/sensory deficits EXTREMITIES: No lower extremity edema  LABORATORY DATA:  I have reviewed the data as listed   Chemistry      Component Value Date/Time   NA 139 12/14/2016 0120   NA 140 11/30/2016 1303   K 3.6 12/14/2016 0120   K 4.0 11/30/2016 1303   CL 104 12/14/2016 0120   CO2 27 12/14/2016 0120   CO2 25 11/30/2016 1303   BUN 12 12/14/2016 0120   BUN 13.8 11/30/2016  1303   CREATININE 0.69 12/14/2016 0120   CREATININE 0.6 11/30/2016 1303      Component Value Date/Time   CALCIUM 9.4 12/14/2016 0120   CALCIUM 8.9 11/30/2016 1303   ALKPHOS 78 11/30/2016 1303   AST 12 11/30/2016 1303   ALT 10 11/30/2016 1303   BILITOT <0.22 11/30/2016 1303       Lab Results  Component Value Date   WBC 5.4 12/17/2016   HGB 9.9 (L) 12/17/2016   HCT 30.2 (L) 12/17/2016   MCV 95.3 12/17/2016   PLT 285 12/17/2016   NEUTROABS 3.9 12/17/2016    ASSESSMENT & PLAN:  Malignant neoplasm of lower-outer quadrant of left breast of female, estrogen receptor positive ([place]) 07/26/2016: Left lumpectomy: IDC 1.8 cm, with DCIS, margins negative, 0/2 lymph nodes, ER 80%, PR 20%, HER-2 negative ratio 1.18, Ki-67 60%, T1cN0 stage IA  Oncotype DX score 51: 10 year risk of recurrence greater than 34%, extremely high risk  Recommendation: 1. Adjuvant chemotherapy with Adriamycin/Cytoxan  x 4, then weekly Taxol x 12 2. Adjuvant radiation therapy followed by 3. Adjuvant antiestrogen therapy -------------------------------------------------------------------------------------------------------------------------------- Current treatment: Holding Cycle 4 dose dense Adriamycin Cytoxan  Chemotherapy toxicities: 1. Alopecia  2. Fatigue: Quite severe 3. Abdominal discomfort: With abdominal cramps 4. Thrush: Resolved with Diflucan 5. Parotitis: Currently on clindamycin oral antibiotic. I instructed her to continue it for 10 days. I would like to postpone her chemotherapy to following Tuesday. reduced the dosage of the current chemotherapy.  Return to clinic next Tuesday for follow-up appointment with labs  I spent 25 minutes talking to the patient of which more than half was spent in counseling and coordination of care.  No orders of the defined types were placed in this encounter.  The patient has a good understanding of the overall plan. she agrees with it. she will call with any problems that may develop before the next visit here.   Rulon Eisenmenger, MD 12/17/16

## 2016-12-17 NOTE — Assessment & Plan Note (Signed)
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 Adjuvant chemotherapy with Adriamycin/Cytoxan x 4, then weekly Taxol x 12 2. Adjuvant radiation therapy followed by 3. Adjuvant antiestrogen therapy -------------------------------------------------------------------------------------------------------------------------------- Current treatment: Cycle 4 dose dense Adriamycin Cytoxan  Chemotherapy toxicities: 1. Alopecia  2. Fatigue 3. Abdominal discomfort 4. Thrush: Resolved with Diflucan  Will reduce the dosage of the current chemotherapy. Return to clinic in 2 weeks for cycle 1 Taxol

## 2016-12-18 LAB — VITAMIN B12: Vitamin B12: >2000 pg/mL — ABNORMAL HIGH (ref 232–1245)

## 2016-12-19 ENCOUNTER — Other Ambulatory Visit: Payer: Self-pay

## 2016-12-19 MED ORDER — CLINDAMYCIN HCL 300 MG PO CAPS: 300.0000 mg | ORAL_CAPSULE | Freq: Four times a day (QID) | ORAL | 0 refills | Status: DC

## 2016-12-19 MED FILL — CLINDAMYCIN HCL 300 MG CAPS: 300 | 7 days supply | Qty: 28 | Fill #0

## 2016-12-21 ENCOUNTER — Ambulatory Visit: Payer: Medicare Other

## 2016-12-25 ENCOUNTER — Encounter: Payer: Self-pay | Admitting: Hematology and Oncology

## 2016-12-25 ENCOUNTER — Encounter: Payer: Self-pay | Admitting: *Deleted

## 2016-12-25 ENCOUNTER — Ambulatory Visit (HOSPITAL_BASED_OUTPATIENT_CLINIC_OR_DEPARTMENT_OTHER): Payer: Medicare Other

## 2016-12-25 ENCOUNTER — Ambulatory Visit (HOSPITAL_BASED_OUTPATIENT_CLINIC_OR_DEPARTMENT_OTHER): Payer: Medicare Other | Admitting: Hematology and Oncology

## 2016-12-25 ENCOUNTER — Other Ambulatory Visit (HOSPITAL_BASED_OUTPATIENT_CLINIC_OR_DEPARTMENT_OTHER): Payer: Medicare Other

## 2016-12-25 ENCOUNTER — Ambulatory Visit: Payer: Medicare Other

## 2016-12-25 DIAGNOSIS — C50512 Malignant neoplasm of lower-outer quadrant of left female breast: Secondary | ICD-10-CM

## 2016-12-25 DIAGNOSIS — Z5111 Encounter for antineoplastic chemotherapy: Secondary | ICD-10-CM

## 2016-12-25 DIAGNOSIS — Z95828 Presence of other vascular implants and grafts: Secondary | ICD-10-CM

## 2016-12-25 DIAGNOSIS — Z170 Estrogen receptor positive status [ER+]: Secondary | ICD-10-CM

## 2016-12-25 DIAGNOSIS — Z5189 Encounter for other specified aftercare: Secondary | ICD-10-CM

## 2016-12-25 LAB — COMPREHENSIVE METABOLIC PANEL
ALT: 10 U/L (ref 0–55)
AST: 15 U/L (ref 5–34)
Albumin: 3.8 g/dL (ref 3.5–5.0)
Alkaline Phosphatase: 60 U/L (ref 40–150)
Anion Gap: 7 mEq/L (ref 3–11)
BUN: 17.9 mg/dL (ref 7.0–26.0)
CO2: 26 mEq/L (ref 22–29)
Calcium: 9 mg/dL (ref 8.4–10.4)
Chloride: 105 mEq/L (ref 98–109)
Creatinine: 0.7 mg/dL (ref 0.6–1.1)
EGFR: >90 mL/min/{1.73_m2} (ref >90)
Glucose: 93 mg/dl (ref 70–140)
Potassium: 4.1 mEq/L (ref 3.5–5.1)
Sodium: 137 mEq/L (ref 136–145)
Total Bilirubin: 0.37 mg/dL (ref 0.20–1.20)
Total Protein: 6.5 g/dL (ref 6.4–8.3)

## 2016-12-25 LAB — CBC WITH DIFFERENTIAL/PLATELET
BASO%: 0.9 % (ref 0.0–2.0)
Basophils Absolute: 0 10*3/uL (ref 0.0–0.1)
EOS%: 1.1 % (ref 0.0–7.0)
Eosinophils Absolute: 0.1 10*3/uL (ref 0.0–0.5)
HCT: 29.5 % — ABNORMAL LOW (ref 34.8–46.6)
HGB: 9.9 g/dL — ABNORMAL LOW (ref 11.6–15.9)
LYMPH%: 15.4 % (ref 14.0–49.7)
MCH: 32 pg (ref 25.1–34.0)
MCHC: 33.5 g/dL (ref 31.5–36.0)
MCV: 95.5 fL (ref 79.5–101.0)
MONO#: 0.7 10*3/uL (ref 0.1–0.9)
MONO%: 15.1 % — ABNORMAL HIGH (ref 0.0–14.0)
NEUT#: 3.3 10*3/uL (ref 1.5–6.5)
NEUT%: 67.5 % (ref 38.4–76.8)
Platelets: 257 10*3/uL (ref 145–400)
RBC: 3.09 10*6/uL — ABNORMAL LOW (ref 3.70–5.45)
RDW: 17.7 % — ABNORMAL HIGH (ref 11.2–14.5)
WBC: 4.9 10*3/uL (ref 3.9–10.3)
lymph#: 0.8 10*3/uL — ABNORMAL LOW (ref 0.9–3.3)

## 2016-12-25 MED ORDER — SODIUM CHLORIDE 0.9 % IV SOLN
Freq: Once | INTRAVENOUS | Status: AC
  Administered 2016-12-25: 15:00:00 via INTRAVENOUS

## 2016-12-25 MED ORDER — SODIUM CHLORIDE 0.9 % IV SOLN
500.0000 mg/m2 | Freq: Once | INTRAVENOUS | Status: AC
  Administered 2016-12-25: 1000 mg via INTRAVENOUS
  Filled 2016-12-25: qty 50

## 2016-12-25 MED ORDER — PALONOSETRON HCL INJECTION 0.25 MG/5ML
INTRAVENOUS | Status: AC
  Filled 2016-12-25: qty 5

## 2016-12-25 MED ORDER — HEPARIN SOD (PORK) LOCK FLUSH 100 UNIT/ML IV SOLN
500.0000 [IU] | Freq: Once | INTRAVENOUS | Status: AC | PRN
  Administered 2016-12-25: 500 [IU]
  Filled 2016-12-25: qty 5

## 2016-12-25 MED ORDER — PEGFILGRASTIM 6 MG/0.6ML ~~LOC~~ PSKT
6.0000 mg | PREFILLED_SYRINGE | Freq: Once | SUBCUTANEOUS | Status: AC
  Administered 2016-12-25: 6 mg via SUBCUTANEOUS
  Filled 2016-12-25: qty 0.6

## 2016-12-25 MED ORDER — SODIUM CHLORIDE 0.9% FLUSH
10.0000 mL | Freq: Once | INTRAVENOUS | Status: AC
  Administered 2016-12-25: 10 mL
  Filled 2016-12-25: qty 10

## 2016-12-25 MED ORDER — DOXORUBICIN HCL CHEMO IV INJECTION 2 MG/ML
50.0000 mg/m2 | Freq: Once | INTRAVENOUS | Status: AC
  Administered 2016-12-25: 100 mg via INTRAVENOUS
  Filled 2016-12-25: qty 50

## 2016-12-25 MED ORDER — PALONOSETRON HCL INJECTION 0.25 MG/5ML
0.2500 mg | Freq: Once | INTRAVENOUS | Status: AC
  Administered 2016-12-25: 0.25 mg via INTRAVENOUS

## 2016-12-25 MED ORDER — SODIUM CHLORIDE 0.9 % IV SOLN
Freq: Once | INTRAVENOUS | Status: AC
  Administered 2016-12-25: 15:00:00 via INTRAVENOUS
  Filled 2016-12-25: qty 5

## 2016-12-25 MED ORDER — SODIUM CHLORIDE 0.9% FLUSH
10.0000 mL | INTRAVENOUS | Status: DC | PRN
  Administered 2016-12-25: 10 mL
  Filled 2016-12-25: qty 10

## 2016-12-25 NOTE — Assessment & Plan Note (Signed)
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 Adjuvant chemotherapy with Adriamycin/Cytoxan x 4, then weekly Taxol x 12 2. Adjuvant radiation therapy followed by 3. Adjuvant antiestrogen therapy -------------------------------------------------------------------------------------------------------------------------------- Current treatment: Cycle 4 dose dense Adriamycin Cytoxan  Chemotherapy toxicities: 1. Alopecia  2.Fatigue: Quite severe 3. Abdominal discomfort: With abdominal cramps 4. Thrush: Resolved with Diflucan 5. Parotitis: Received oral clindamycin. reduced the dosage of the current chemotherapy.  Return to clinic in 2 weeks for cycle 1 of Taxol

## 2016-12-25 NOTE — Patient Instructions (Signed)

## 2016-12-25 NOTE — Progress Notes (Signed)
Patient Care Team: [name], MD as PCP - General (Family Medicine) Nicholas Lose, MD as Consulting Physician (Hematology and Oncology)  DIAGNOSIS:  Encounter Diagnosis  Name Primary?  . Malignant neoplasm of lower-outer quadrant of left breast of female, estrogen receptor positive ([place])     SUMMARY OF ONCOLOGIC HISTORY:   Malignant neoplasm of lower-outer quadrant of left breast of female, estrogen receptor positive ([place])   06/26/2016 Initial Diagnosis    Left breast biopsy 3:00: IDC with DCIS, grade 3, ER 80%, PR 20%, Ki-67 60%, HER-2 negative ratio 1.18; palpable lump: 1.8 cm lesion, no lymph nodes, T1 cN0 stage I a clinical stage      07/26/2016 Surgery    Left lumpectomy: IDC 1.8 cm, with DCIS, margins negative, 0/2 lymph nodes, ER 80%, PR 20%, HER-2 negative ratio 1.18, Ki-67 60%, T1cN0 stage IA       08/21/2016 Oncotype testing    Oncotype DX recurrence score 51, risk of recurrence 34%      08/21/2016 Genetic Testing    Genetic counseling and testing for hereditary cancer syndromes performed on 08/21/2016. Results are negative for pathogenic mutations in 46 genes analyzed by [facility] Common Hereditary Cancers Panel. Results are dated 09/14/2016. Genes tested: APC, ATM, AXIN2, BARD1, BMPR1A, BRCA1, BRCA2, BRIP1, CDH1, CDKN2A, CHEK2, CTNNA1, DICER1, EPCAM, GREM1, HOXB13, KIT, MEN1, MLH1, MSH2, MSH3, MSH6, MUTYH, NBN, NF1, NTHL1, PALB2, PDGFRA, PMS2, POLD1, POLE, PTEN, RAD50, RAD51C, RAD51D, SDHA, SDHB, SDHC, SDHD, SMAD4, SMARCA4, STK11, TP53, TSC1, TSC2, and VHL.        11/02/2016 -  Chemotherapy    Dose dense Adriamycin and Cytoxan 4 followed by Taxol weekly 12       CHIEF COMPLIANT: Cycle 4 dose dense Adriamycin Cytoxan  INTERVAL HISTORY: [name] is a 56 year old with above-mentioned history of left breast cancer treated with lumpectomy and is currently on adjuvant chemotherapy. Today is cycle 4 of dose dense Adriamycin Cytoxan. She had parotid gland  infection for which she received clindamycin oral antibiotic. She continues to have mild discomfort underneath the ears.  REVIEW OF SYSTEMS:   Constitutional: Denies fevers, chills or abnormal weight loss Eyes: Denies blurriness of vision Ears, nose, mouth, throat, and face: Denies mucositis or sore throat Respiratory: Denies cough, dyspnea or wheezes Cardiovascular: Denies palpitation, chest discomfort Gastrointestinal:  Denies nausea, heartburn or change in bowel habits Skin: Denies abnormal skin rashes Lymphatics: Denies new lymphadenopathy or easy bruising Neurological:Denies numbness, tingling or new weaknesses Behavioral/Psych: Mood is stable, no new changes  Extremities: No lower extremity edema Breast:  denies any pain or lumps or nodules in either breasts All other systems were reviewed with the patient and are negative.  I have reviewed the past medical history, past surgical history, social history and family history with the patient and they are unchanged from previous note.  ALLERGIES:  is allergic to aspirin; oxycodone hcl; propoxyphene n-acetaminophen; tramadol; adhesive [tape]; and prednisone.  MEDICATIONS:  Current Outpatient Prescriptions  Medication Sig Dispense Refill  . acetaminophen (TYLENOL) 500 MG tablet Take 1,000 mg by mouth every 6 (six) hours as needed (pain).    Marland Kitchen acetaminophen-codeine (TYLENOL #3) 300-30 MG tablet Take 1 tablet by mouth every 6 (six) hours as needed for moderate pain. 20 tablet 0  . albuterol (PROAIR HFA) 108 (90 Base) MCG/ACT inhaler Inhale 2 puffs into the lungs every 6 (six) hours as needed for wheezing or shortness of breath.    . Calcium Citrate-Vitamin D (CALCIUM CITRATE +D PO) Take 2 tablets  by mouth daily. Calcium 600 mg     . clindamycin (CLEOCIN) 300 MG capsule Take 1 capsule (300 mg total) by mouth 4 (four) times daily. X 7 days 28 capsule 0  . cyclobenzaprine (FLEXERIL) 5 MG tablet Take 5 mg by mouth 3 (three) times daily as  needed for muscle spasms.    Marland Kitchen gabapentin (NEURONTIN) 300 MG capsule Take 300 mg by mouth 3 (three) times daily.    Marland Kitchen lidocaine-prilocaine (EMLA) cream Apply to affected area once 30 g 3  . lisinopril (PRINIVIL,ZESTRIL) 20 MG tablet Take 20 mg by mouth daily.    . mometasone (ELOCON) 0.1 % cream Apply 1 application topically daily as needed (rash - summer eczema). Apply to arms    . Multiple Vitamin (MULTIVITAMIN WITH MINERALS) TABS tablet Take 1 tablet by mouth at bedtime.    . Multiple Vitamins-Iron (MULTI-VITAMIN/IRON PO) Take 1 tablet by mouth daily.    Marland Kitchen omeprazole (PRILOSEC) 40 MG capsule Take 1 capsule (40 mg total) by mouth daily. 30 capsule 5  . ondansetron (ZOFRAN) 8 MG tablet Take 1 tablet (8 mg total) by mouth 2 (two) times daily as needed. Start on the third day after chemotherapy. (Patient not taking: Reported on 11/02/2016) 30 tablet 1  . oxybutynin (DITROPAN) 5 MG tablet Take 1 tablet (5 mg total) by mouth 3 (three) times daily. 90 tablet 0  . prochlorperazine (COMPAZINE) 10 MG tablet Take 1 tablet (10 mg total) by mouth every 6 (six) hours as needed (Nausea or vomiting). 30 tablet 1  . ranitidine (ZANTAC) 300 MG capsule Take 1 capsule (300 mg total) by mouth 2 (two) times daily. 60 capsule 3  . silver sulfADIAZINE (SILVADENE) 1 % cream Apply 1 application topically 2 (two) times daily as needed (stomach tears). 50 g 0  . sucralfate (CARAFATE) 1 g tablet Take 1 tablet (1 g total) by mouth 4 (four) times daily -  with meals and at bedtime. 120 tablet 0   No current facility-administered medications for this visit.     PHYSICAL EXAMINATION: ECOG PERFORMANCE STATUS: 1 - Symptomatic but completely ambulatory  Vitals:   12/25/16 1420  BP: (!) 114/57  Pulse: 61  Resp: 20  Temp: 98.1 F (36.7 C)   Filed Weights   12/25/16 1420  Weight: 195 lb 3.2 oz (88.5 kg)    GENERAL:alert, no distress and comfortable SKIN: skin color, texture, turgor are normal, no rashes or significant  lesions EYES: normal, Conjunctiva are pink and non-injected, sclera clear OROPHARYNX:no exudate, no erythema and lips, buccal mucosa, and tongue normal  NECK: supple, thyroid normal size, non-tender, without nodularity LYMPH:  no palpable lymphadenopathy in the cervical, axillary or inguinal LUNGS: clear to auscultation and percussion with normal breathing effort HEART: regular rate & rhythm and no murmurs and no lower extremity edema ABDOMEN:abdomen soft, non-tender and normal bowel sounds MUSCULOSKELETAL:no cyanosis of digits and no clubbing  NEURO: alert & oriented x 3 with fluent speech, no focal motor/sensory deficits EXTREMITIES: No lower extremity edema  LABORATORY DATA:  I have reviewed the data as listed   Chemistry      Component Value Date/Time   NA 139 12/17/2016 1032   K 3.9 12/17/2016 1032   CL 104 12/14/2016 0120   CO2 27 12/17/2016 1032   BUN 10.8 12/17/2016 1032   CREATININE 0.7 12/17/2016 1032      Component Value Date/Time   CALCIUM 9.3 12/17/2016 1032   ALKPHOS 79 12/17/2016 1032   AST 15 12/17/2016  1032   ALT 8 12/17/2016 1032   BILITOT 0.34 12/17/2016 1032       Lab Results  Component Value Date   WBC 4.9 12/25/2016   HGB 9.9 (L) 12/25/2016   HCT 29.5 (L) 12/25/2016   MCV 95.5 12/25/2016   PLT 257 12/25/2016   NEUTROABS 3.3 12/25/2016    ASSESSMENT & PLAN:  Malignant neoplasm of lower-outer quadrant of left breast of female, estrogen receptor positive ([place]) 07/26/2016: Left lumpectomy: IDC 1.8 cm, with DCIS, margins negative, 0/2 lymph nodes, ER 80%, PR 20%, HER-2 negative ratio 1.18, Ki-67 60%, T1cN0 stage IA  Oncotype DX score 51: 10 year risk of recurrence greater than 34%, extremely high risk  Recommendation: 1. Adjuvant chemotherapy with Adriamycin/Cytoxan x 4, then weekly Taxol x 12 2. Adjuvant radiation therapy followed by 3. Adjuvant antiestrogen  therapy -------------------------------------------------------------------------------------------------------------------------------- Current treatment: Cycle 4 dose dense Adriamycin Cytoxan  Chemotherapy toxicities: 1. Alopecia  2.Fatigue: Quite severe 3. Abdominal discomfort: With abdominal cramps 4. Thrush: Resolved with Diflucan 5. Parotitis: Received oral clindamycin. reduced the dosage of the current chemotherapy.  Return to clinic in 2 weeks for cycle 1 of Taxol Patient wants to take off for vacation the week of 8/24 We will postpone her Taxol chemotherapy to the following week and I will see her at that time.   I spent 25 minutes talking to the patient of which more than half was spent in counseling and coordination of care.  No orders of the defined types were placed in this encounter.  The patient has a good understanding of the overall plan. she agrees with it. she will call with any problems that may develop before the next visit here.   Rulon Eisenmenger, MD 12/25/16

## 2016-12-25 NOTE — Progress Notes (Signed)
Adriamycin administered through a free dripping normal saline line. Positive blood return noted before, every 5 mL during, and after Adriamycin administration. Patient tolerated well.

## 2016-12-25 NOTE — Patient Instructions (Signed)
Cancer Center Discharge Instructions for Patients Receiving Chemotherapy  Today you received the following chemotherapy agents Adriamycin/Cytoxan.  To help prevent nausea and vomiting after your treatment, we encourage you to take your nausea medication as prescribed.    If you develop nausea and vomiting that is not controlled by your nausea medication, call the clinic.   BELOW ARE SYMPTOMS THAT SHOULD BE REPORTED IMMEDIATELY:  *FEVER GREATER THAN 100.5 F  *CHILLS WITH OR WITHOUT FEVER  NAUSEA AND VOMITING THAT IS NOT CONTROLLED WITH YOUR NAUSEA MEDICATION  *UNUSUAL SHORTNESS OF BREATH  *UNUSUAL BRUISING OR BLEEDING  TENDERNESS IN MOUTH AND THROAT WITH OR WITHOUT PRESENCE OF ULCERS  *URINARY PROBLEMS  *BOWEL PROBLEMS  UNUSUAL RASH Items with * indicate a potential emergency and should be followed up as soon as possible.  Feel free to call the clinic you have any questions or concerns. The clinic phone number is [phone].  Please show the CHEMO ALERT CARD at check-in to the Emergency Department and triage nurse.   

## 2016-12-27 ENCOUNTER — Telehealth: Payer: Self-pay | Admitting: *Deleted

## 2016-12-27 NOTE — Telephone Encounter (Signed)
Pt called to relate her chemo on 8/17 was cancelled. Orders placed for chemo to be scheduled again on 8/17 and requested scheduling to call pt with appt.  Called pt to apologize that chemo was cancelled and informed orders were placed and she will receive a call with a new appt time for 8/17. Denies further questions at this time.

## 2017-01-01 ENCOUNTER — Telehealth: Payer: Self-pay | Admitting: *Deleted

## 2017-01-01 NOTE — Telephone Encounter (Signed)
Received call from patient stating she is not feeling well. She states she was in the ED at the end of July for parotitis and was given antibiotics which have seemed to help but still feels as she calls "off".  She is unable to get into her ENT until September and would like for someone to see her here before her treatment on Friday.  I have her scheduled for labs and see Altamese Dilling, NP 01/02/17.

## 2017-01-02 ENCOUNTER — Encounter: Payer: Self-pay | Admitting: Oncology

## 2017-01-02 ENCOUNTER — Ambulatory Visit (HOSPITAL_BASED_OUTPATIENT_CLINIC_OR_DEPARTMENT_OTHER): Payer: Medicare Other | Admitting: Oncology

## 2017-01-02 ENCOUNTER — Other Ambulatory Visit (HOSPITAL_BASED_OUTPATIENT_CLINIC_OR_DEPARTMENT_OTHER): Payer: Medicare Other

## 2017-01-02 VITALS — BP 110/50 | HR 57 | Temp 99.1°F | Resp 20 | Ht 61.0 in | Wt 192.3 lb

## 2017-01-02 DIAGNOSIS — C50512 Malignant neoplasm of lower-outer quadrant of left female breast: Secondary | ICD-10-CM

## 2017-01-02 DIAGNOSIS — K1120 Sialoadenitis, unspecified: Secondary | ICD-10-CM

## 2017-01-02 DIAGNOSIS — R109 Unspecified abdominal pain: Secondary | ICD-10-CM | POA: Diagnosis not present

## 2017-01-02 DIAGNOSIS — R530 Neoplastic (malignant) related fatigue: Secondary | ICD-10-CM

## 2017-01-02 DIAGNOSIS — L658 Other specified nonscarring hair loss: Secondary | ICD-10-CM | POA: Diagnosis not present

## 2017-01-02 DIAGNOSIS — Z170 Estrogen receptor positive status [ER+]: Secondary | ICD-10-CM

## 2017-01-02 LAB — COMPREHENSIVE METABOLIC PANEL
ALT: 7 U/L (ref 0–55)
AST: 15 U/L (ref 5–34)
Albumin: 4 g/dL (ref 3.5–5.0)
Alkaline Phosphatase: 85 U/L (ref 40–150)
Anion Gap: 10 mEq/L (ref 3–11)
BUN: 10.3 mg/dL (ref 7.0–26.0)
CO2: 25 mEq/L (ref 22–29)
Calcium: 9.4 mg/dL (ref 8.4–10.4)
Chloride: 101 mEq/L (ref 98–109)
Creatinine: 0.7 mg/dL (ref 0.6–1.1)
EGFR: >90 mL/min/{1.73_m2} (ref >90)
Glucose: 102 mg/dl (ref 70–140)
Potassium: 4.2 mEq/L (ref 3.5–5.1)
Sodium: 137 mEq/L (ref 136–145)
Total Bilirubin: 0.43 mg/dL (ref 0.20–1.20)
Total Protein: 6.8 g/dL (ref 6.4–8.3)

## 2017-01-02 LAB — CBC WITH DIFFERENTIAL/PLATELET
BASO%: 0.9 % (ref 0.0–2.0)
Basophils Absolute: 0 10*3/uL (ref 0.0–0.1)
EOS%: 3.2 % (ref 0.0–7.0)
Eosinophils Absolute: 0.2 10*3/uL (ref 0.0–0.5)
HCT: 27.9 % — ABNORMAL LOW (ref 34.8–46.6)
HGB: 9.3 g/dL — ABNORMAL LOW (ref 11.6–15.9)
LYMPH%: 11 % — ABNORMAL LOW (ref 14.0–49.7)
MCH: 32 pg (ref 25.1–34.0)
MCHC: 33.3 g/dL (ref 31.5–36.0)
MCV: 96.2 fL (ref 79.5–101.0)
MONO#: 0.3 10*3/uL (ref 0.1–0.9)
MONO%: 6.6 % (ref 0.0–14.0)
NEUT#: 3.8 10*3/uL (ref 1.5–6.5)
NEUT%: 78.3 % — ABNORMAL HIGH (ref 38.4–76.8)
Platelets: 119 10*3/uL — ABNORMAL LOW (ref 145–400)
RBC: 2.91 10*6/uL — ABNORMAL LOW (ref 3.70–5.45)
RDW: 17.3 % — ABNORMAL HIGH (ref 11.2–14.5)
WBC: 4.9 10*3/uL (ref 3.9–10.3)
lymph#: 0.5 10*3/uL — ABNORMAL LOW (ref 0.9–3.3)

## 2017-01-02 NOTE — Progress Notes (Signed)
[facility] Follow up:    [name], MD [address] [place] [address]   DIAGNOSIS: Cancer Staging Malignant neoplasm of lower-outer quadrant of left breast of female, estrogen receptor positive ([place]) Staging form: Breast, AJCC 8th Edition - Clinical: Stage IA (cT1c, cN0(sn), cM0, G3, ER: Positive, PR: Positive, HER2: Negative, Oncotype DX score: 51) - Unsigned   SUMMARY OF ONCOLOGIC HISTORY:   Malignant neoplasm of lower-outer quadrant of left breast of female, estrogen receptor positive ([place])   06/26/2016 Initial Diagnosis    Left breast biopsy 3:00: IDC with DCIS, grade 3, ER 80%, PR 20%, Ki-67 60%, HER-2 negative ratio 1.18; palpable lump: 1.8 cm lesion, no lymph nodes, T1 cN0 stage I a clinical stage      07/26/2016 Surgery    Left lumpectomy: IDC 1.8 cm, with DCIS, margins negative, 0/2 lymph nodes, ER 80%, PR 20%, HER-2 negative ratio 1.18, Ki-67 60%, T1cN0 stage IA       08/21/2016 Oncotype testing    Oncotype DX recurrence score 51, risk of recurrence 34%      08/21/2016 Genetic Testing    Genetic counseling and testing for hereditary cancer syndromes performed on 08/21/2016. Results are negative for pathogenic mutations in 46 genes analyzed by [facility]'s Common Hereditary Cancers Panel. Results are dated 09/14/2016. Genes tested: APC, ATM, AXIN2, BARD1, BMPR1A, BRCA1, BRCA2, BRIP1, CDH1, CDKN2A, CHEK2, CTNNA1, DICER1, EPCAM, GREM1, HOXB13, KIT, MEN1, MLH1, MSH2, MSH3, MSH6, MUTYH, NBN, NF1, NTHL1, PALB2, PDGFRA, PMS2, POLD1, POLE, PTEN, RAD50, RAD51C, RAD51D, SDHA, SDHB, SDHC, SDHD, SMAD4, SMARCA4, STK11, TP53, TSC1, TSC2, and VHL.        11/02/2016 -  Chemotherapy    Dose dense Adriamycin and Cytoxan 4 followed by Taxol weekly 12       CURRENT THERAPY: cycle 4 day 8 of dose dense Adriamycin and Cytoxan. Patient is due to begin weekly Taxol following her Adriamycin and Cytoxan  INTERVAL HISTORY: [name] 56 y.o.  female returns for a work in appointment per the breast oncology navigator. The patient contacted our office today saying that she did not feel well. She told the nurse that she was treated for parotits recently [name] still was not feeling well. She was unable to get an ENT appointment until sometime in September. She asked to be seen for further evaluation. Patient reports that she has ongoing fatigue. She denies fevers and chills. Denies chest pain, shortness of breath, cough, hemoptysis. Denies abdominal pain but has intermittent nausea and vomited yesterday. Reports that she feels dizzy at times. Neck still feels swollen and she has pain near her ears. Appetite is poor and she has lost about 3 pounds since her last visit. States that she is eating primarily eggs and grits. Trying to drink fluids as much as she can.   Patient Active Problem List   Diagnosis Date Noted  . Port catheter in place 11/02/2016  . Genetic testing 09/19/2016  . Pre-operative clearance 07/23/2016  . Malignant neoplasm of lower-outer quadrant of left breast of female, estrogen receptor positive ([place]) 07/10/2016  . Essential hypertension   . Chest pain 09/30/2015  . DVT, lower extremity ([place]) 06/18/2011  . DVT of upper extremity (deep vein thrombosis) ([place]) 06/18/2011  . [place] filter in place 06/18/2011  . History of stroke 06/18/2011  . PFO (patent foramen ovale) 06/18/2011  . Status post gastric bypass for obesity 06/18/2011  . PELVIC PAIN, CHRONIC 07/20/2008  . FIBROIDS, UTERUS 07/16/2008  . ASTHMA  07/16/2008  . FIBROMYALGIA 07/16/2008  . CARPAL TUNNEL SYNDROME, HX OF 07/16/2008  . History of pulmonary embolus (PE) 07/16/2008    is allergic to aspirin; oxycodone hcl; propoxyphene n-acetaminophen; tramadol; adhesive [tape]; and prednisone.  MEDICAL HISTORY: Past Medical History:  Diagnosis Date  . Anemia    since bypass  . Arthritis    osteoarthritis  . Asthma    states no asthma attack since 2002  .  Breast cancer ([place]) 06/26/16 bx   left breast  . Chronic back pain   . Complication of anesthesia    states takes more than normal to put her to sleep  . Dental bridge present    upper  . Dental crowns present   . DVT of upper extremity (deep vein thrombosis) ([place])   . Fibromyalgia   . Genetic testing 09/19/2016   Ms. Weill underwent genetic counseling and testing for hereditary cancer syndromes on 08/21/2016. Her results were negative for mutations in all 46 genes analyzed by [facility]'s 46-gene Common Hereditary Cancers Panel. Genes analyzed include: APC, ATM, AXIN2, BARD1, BMPR1A, BRCA1, BRCA2, BRIP1, CDH1, CDKN2A, CHEK2, CTNNA1, DICER1, EPCAM, GREM1, HOXB13, KIT, MEN1, MLH1, MSH2, MSH3, MSH6, MUTYH, NBN,   . Headache(784.0)    migraines  . History of blood transfusion 06/2005  . History of gallstones   . History of pneumonia   . History of shingles 07/2011  . HTN (hypertension)   . Normal coronary arteries 2003  . PFO (patent foramen ovale)   . Presence of inferior vena cava filter   . Pulmonary embolism ([place])   . Sleep apnea    used CPAP until after bypass surg.  . Status post gastric bypass for obesity   . Stroke [facility]) 12/2002   right-sided weakness  . Urinary incontinence     SURGICAL HISTORY: Past Surgical History:  Procedure Laterality Date  . ABDOMINAL HYSTERECTOMY  11/1997   complete  . ANTERIOR CERVICAL DECOMP/DISCECTOMY FUSION  02/05/2005   C5-6  . APPENDECTOMY  10/22/2008   laparoscopic  . BREAST LUMPECTOMY WITH RADIOACTIVE SEED AND SENTINEL LYMPH NODE BIOPSY Left 07/26/2016   Procedure: BREAST LUMPECTOMY WITH RADIOACTIVE SEED AND SENTINEL LYMPH NODE BIOPSY;  Surgeon: Erroll Luna, MD;  Location: [facility];  Service: General;  Laterality: Left;  . BUNIONECTOMY  05/1980   both feet  . BUNIONECTOMY  08/2011   left foot  . CARDIAC CATHETERIZATION  03/04/2002  . CARPAL TUNNEL RELEASE  06/21/2009   right  . CARPAL TUNNEL RELEASE     left hand  . CARPAL  TUNNEL RELEASE  10/03/2011   Procedure: CARPAL TUNNEL RELEASE;  Surgeon: Wynonia Sours, MD;  Location: [facility];  Service: Orthopedics;  Laterality: Right;  CARPAL TUNNEL WITH HYPOTHENAR FAT PAD TRANSFER  . CERVICAL SPINE SURGERY  01/2005   titanium plate implanted  . CHOLECYSTECTOMY  1990  . ELBOW SURGERY  08/09/2004   decompression ulnar nerve right elbow  . ENTEROLYSIS  10/22/2008   laparoscopic abd. enterolysis  . GASTRIC ROUX-EN-Y  2009  . HEEL SPUR SURGERY  08/1997   left  . HEMORRHOID SURGERY  03/1993  . LAPAROSCOPIC LYSIS INTESTINAL ADHESIONS  02/14/2000  . NAILBED REPAIR  01/10/2005; 08/2011   exc. matrix bilat. great toe  . OTHER SURGICAL HISTORY  12/1986   pt states that she had surgery to unclog her fallopean tubes  . PORTACATH PLACEMENT N/A 09/24/2016   Procedure: INSERTION PORT-A-CATH WITH [name];  Surgeon: Erroll Luna, MD;  Location: [facility]  OR;  Service: General;  Laterality: N/A;  . SHOULDER SURGERY     bilat. - (left:  06/2005)  . TONSILLECTOMY  07/1995  . TRIGGER FINGER RELEASE  04/25/2006   decompression A-1 pulley left thumb  . UTERINE FIBROID SURGERY  12/95, 7/96   x2  . VENA CAVA FILTER PLACEMENT  2009   during Roux-en-Y surg.    SOCIAL HISTORY: Social History   Social History  . Marital status: Single    Spouse name: N/A  . Number of children: 0  . Years of education: N/A   Occupational History  . disabled    Social History Main Topics  . Smoking status: Former Research scientist (life sciences)  . Smokeless tobacco: Never Used     Comment: quit smoking 08/1989  . Alcohol use No  . Drug use: No  . Sexual activity: No   Other Topics Concern  . Not on file   Social History Narrative  . No narrative on file    FAMILY HISTORY: Family History  Problem Relation Age of Onset  . Breast cancer Paternal Aunt 65  . Cervical cancer Paternal Grandmother 34       d.40s  . Ovarian cancer Maternal Grandmother 23       d.23  . Colon polyps Father   . Diabetes Father         borderline  . Prostate cancer Father   . Hypertension Mother   . Hypertension Unknown   . Breast cancer Sister 77       treated with neoadjuvant chemo/radiation and lumpectomy    Review of Systems  Constitutional: Positive for appetite change and fatigue. Negative for chills and fever.  HENT:   Negative for mouth sores.        Sore throat at times and states that it feels that she swallowing class. Pain in her ears. Denies difficulty swallowing.  Eyes: Negative.   Respiratory: Negative.   Cardiovascular: Negative.   Gastrointestinal: Positive for nausea and vomiting. Negative for constipation and diarrhea.  Endocrine: Negative.   Genitourinary: Negative.    Musculoskeletal: Negative.   Skin: Negative.   Neurological: Negative.   Hematological: Negative.   Psychiatric/Behavioral: Negative.       PHYSICAL EXAMINATION  ECOG PERFORMANCE STATUS: 1 - Symptomatic but completely ambulatory  Vitals:   01/02/17 1446  BP: (!) 110/50  Pulse: (!) 57  Resp: 20  Temp: 99.1 F (37.3 C)  SpO2: 100%    Physical Exam  Constitutional: She is oriented to person, place, and time and well-developed, well-nourished, and in no distress.  HENT:  Head: Normocephalic and atraumatic.  Right Ear: External ear normal.  Left Ear: External ear normal.  Mouth/Throat: Oropharynx is clear and moist. No oropharyngeal exudate.  Eyes: Conjunctivae are normal. Right eye exhibits no discharge. Left eye exhibits no discharge. No scleral icterus.  Neck: Normal range of motion. Neck supple.  Cardiovascular: Normal rate, regular rhythm, normal heart sounds and intact distal pulses.   Pulmonary/Chest: Effort normal and breath sounds normal. No respiratory distress. She has no wheezes. She has no rales.  Abdominal: Soft. Bowel sounds are normal. She exhibits no distension and no mass. There is no tenderness.  Musculoskeletal: Normal range of motion. She exhibits no edema.  Lymphadenopathy:    She has no  cervical adenopathy.  Neurological: She is alert and oriented to person, place, and time. Gait normal.  Skin: Skin is warm and dry. No rash noted. She is not diaphoretic. No erythema. No pallor.  Psychiatric: Mood, memory, affect and judgment normal.  Vitals reviewed.   LABORATORY DATA:  CBC    Component Value Date/Time   WBC 4.9 01/02/2017 1435   WBC 7.5 12/14/2016 0120   RBC 2.91 (L) 01/02/2017 1435   RBC 3.13 (L) 12/14/2016 0120   HGB 9.3 (L) 01/02/2017 1435   HCT 27.9 (L) 01/02/2017 1435   PLT 119 (L) 01/02/2017 1435   MCV 96.2 01/02/2017 1435   MCH 32.0 01/02/2017 1435   MCH 31.0 12/14/2016 0120   MCHC 33.3 01/02/2017 1435   MCHC 33.9 12/14/2016 0120   RDW 17.3 (H) 01/02/2017 1435   LYMPHSABS 0.5 (L) 01/02/2017 1435   MONOABS 0.3 01/02/2017 1435   EOSABS 0.2 01/02/2017 1435   BASOSABS 0.0 01/02/2017 1435    CMP     Component Value Date/Time   NA 137 01/02/2017 1435   K 4.2 01/02/2017 1435   CL 104 12/14/2016 0120   CO2 25 01/02/2017 1435   GLUCOSE 102 01/02/2017 1435   BUN 10.3 01/02/2017 1435   CREATININE 0.7 01/02/2017 1435   CALCIUM 9.4 01/02/2017 1435   PROT 6.8 01/02/2017 1435   ALBUMIN 4.0 01/02/2017 1435   AST 15 01/02/2017 1435   ALT 7 01/02/2017 1435   ALKPHOS 85 01/02/2017 1435   BILITOT 0.43 01/02/2017 1435   GFRNONAA >60 12/14/2016 0120   GFRAA >60 12/14/2016 0120    RADIOGRAPHIC STUDIES:  No results found.  ASSESSMENT and THERAPY PLAN:   Malignant neoplasm of lower-outer quadrant of left breast of female, estrogen receptor positive ([place]) 07/26/2016: Left lumpectomy: IDC 1.8 cm, with DCIS, margins negative, 0/2 lymph nodes, ER 80%, PR 20%, HER-2 negative ratio 1.18, Ki-67 60%, T1cN0 stage IA  Oncotype DX score 51: 10 year risk of recurrence greater than 34%, extremely high risk  Recommendation: 1. Adjuvant chemotherapy with Adriamycin/Cytoxan x 4, then weekly Taxol x 12 2. Adjuvant radiation therapy followed by 3. Adjuvant  antiestrogen therapy -------------------------------------------------------------------------------------------------------------------------------- Current treatment: Cycle 4day 8 dose dense Adriamycin Cytoxan  Chemotherapy toxicities: 1. Alopecia  2.Fatigue: Quite severe 3. Abdominal discomfort: With abdominal cramps 4. Thrush: Resolved with Diflucan 5. Parotitis: Received oral clindamycin. reducedthe dosage of the current chemotherapy.  The patient is still having significant fatigue from her recent chemotherapy. She is she due to begin Taxol on August 17, but this is not a full 2 weeks following her Adriamycin and Cytoxan. We discussed allowing her chemotherapy until she is a full 2 weeks out from her last cycle of Adriamycin Cytoxan. She is on vacation next week which would be when the Taxol should start. We discussed delaying the start of her Taxol until after she returns from vacation. This will give her time to feel better and hopefully be stronger for the treatment.  We contacted Dr. Noreene Filbert office on the patient's behalf. We were able to get an appointment with Dr. Redmond Baseman for her tomorrow morning at 9:30 AM for evaluation of her parotitis.   The patient will keep her follow-up with Dr. Lindi Adie as scheduled on August 31 to begin her Taxol.   No orders of the defined types were placed in this encounter.   All questions were answered. The patient knows to call the clinic with any problems, questions or concerns. We can certainly see the patient much sooner if necessary.  Mikey Bussing, NP 01/02/2017

## 2017-01-02 NOTE — Progress Notes (Signed)
Per Mikey Bussing, NP, patient scheduled for ENT appointment with [facility] on 8/16.

## 2017-01-02 NOTE — Assessment & Plan Note (Signed)
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 Adjuvant chemotherapy with Adriamycin/Cytoxan x 4, then weekly Taxol x 12 2. Adjuvant radiation therapy followed by 3. Adjuvant antiestrogen therapy -------------------------------------------------------------------------------------------------------------------------------- Current treatment: Cycle 4day 8 dose dense Adriamycin Cytoxan  Chemotherapy toxicities: 1. Alopecia  2.Fatigue: Quite severe 3. Abdominal discomfort: With abdominal cramps 4. Thrush: Resolved with Diflucan 5. Parotitis: Received oral clindamycin. reducedthe dosage of the current chemotherapy.  The patient is still having significant fatigue from her recent chemotherapy. She is she due to begin Taxol on August 17, but this is not a full 2 weeks following her Adriamycin and Cytoxan. We discussed allowing her chemotherapy until she is a full 2 weeks out from her last cycle of Adriamycin Cytoxan. She is on vacation next week which would be when the Taxol should start. We discussed delaying the start of her Taxol until after she returns from vacation. This will give her time to feel better and hopefully be stronger for the treatment.  We contacted Dr. Noreene Filbert office on the patient's behalf. We were able to get an appointment with Dr. Redmond Baseman for her tomorrow morning at 9:30 AM for evaluation of her parotitis.   The patient will keep her follow-up with Dr. Lindi Adie as scheduled on August 31 to begin her Taxol.

## 2017-01-03 ENCOUNTER — Other Ambulatory Visit: Payer: Self-pay | Admitting: *Deleted

## 2017-01-03 DIAGNOSIS — K1120 Sialoadenitis, unspecified: Secondary | ICD-10-CM | POA: Diagnosis not present

## 2017-01-04 ENCOUNTER — Other Ambulatory Visit: Payer: Medicare Other

## 2017-01-04 ENCOUNTER — Ambulatory Visit: Payer: Medicare Other

## 2017-01-18 ENCOUNTER — Ambulatory Visit (HOSPITAL_BASED_OUTPATIENT_CLINIC_OR_DEPARTMENT_OTHER): Payer: Medicare Other

## 2017-01-18 ENCOUNTER — Ambulatory Visit: Payer: Medicare Other

## 2017-01-18 ENCOUNTER — Ambulatory Visit (HOSPITAL_BASED_OUTPATIENT_CLINIC_OR_DEPARTMENT_OTHER): Payer: Medicare Other | Admitting: Hematology and Oncology

## 2017-01-18 ENCOUNTER — Encounter: Payer: Self-pay | Admitting: Hematology and Oncology

## 2017-01-18 ENCOUNTER — Other Ambulatory Visit (HOSPITAL_BASED_OUTPATIENT_CLINIC_OR_DEPARTMENT_OTHER): Payer: Medicare Other

## 2017-01-18 VITALS — BP 126/71 | HR 57 | Temp 98.5°F | Resp 16

## 2017-01-18 DIAGNOSIS — Z170 Estrogen receptor positive status [ER+]: Secondary | ICD-10-CM

## 2017-01-18 DIAGNOSIS — C50512 Malignant neoplasm of lower-outer quadrant of left female breast: Secondary | ICD-10-CM

## 2017-01-18 DIAGNOSIS — Z5111 Encounter for antineoplastic chemotherapy: Secondary | ICD-10-CM

## 2017-01-18 DIAGNOSIS — Z95828 Presence of other vascular implants and grafts: Secondary | ICD-10-CM

## 2017-01-18 LAB — CBC WITH DIFFERENTIAL/PLATELET
BASO%: 0.7 % (ref 0.0–2.0)
Basophils Absolute: 0 10*3/uL (ref 0.0–0.1)
EOS%: 1.3 % (ref 0.0–7.0)
Eosinophils Absolute: 0.1 10*3/uL (ref 0.0–0.5)
HCT: 28.1 % — ABNORMAL LOW (ref 34.8–46.6)
HGB: 9.2 g/dL — ABNORMAL LOW (ref 11.6–15.9)
LYMPH%: 16.8 % (ref 14.0–49.7)
MCH: 32.3 pg (ref 25.1–34.0)
MCHC: 32.7 g/dL (ref 31.5–36.0)
MCV: 98.6 fL (ref 79.5–101.0)
MONO#: 0.8 10*3/uL (ref 0.1–0.9)
MONO%: 17.7 % — ABNORMAL HIGH (ref 0.0–14.0)
NEUT#: 2.9 10*3/uL (ref 1.5–6.5)
NEUT%: 63.5 % (ref 38.4–76.8)
Platelets: 257 10*3/uL (ref 145–400)
RBC: 2.85 10*6/uL — ABNORMAL LOW (ref 3.70–5.45)
RDW: 16.7 % — ABNORMAL HIGH (ref 11.2–14.5)
WBC: 4.6 10*3/uL (ref 3.9–10.3)
lymph#: 0.8 10*3/uL — ABNORMAL LOW (ref 0.9–3.3)

## 2017-01-18 LAB — COMPREHENSIVE METABOLIC PANEL
ALT: 8 U/L (ref 0–55)
AST: 15 U/L (ref 5–34)
Albumin: 3.6 g/dL (ref 3.5–5.0)
Alkaline Phosphatase: 64 U/L (ref 40–150)
Anion Gap: 7 mEq/L (ref 3–11)
BUN: 14 mg/dL (ref 7.0–26.0)
CO2: 26 mEq/L (ref 22–29)
Calcium: 9.2 mg/dL (ref 8.4–10.4)
Chloride: 107 mEq/L (ref 98–109)
Creatinine: 0.6 mg/dL (ref 0.6–1.1)
EGFR: >90 mL/min/{1.73_m2} (ref >90)
Glucose: 85 mg/dl (ref 70–140)
Potassium: 3.9 mEq/L (ref 3.5–5.1)
Sodium: 139 mEq/L (ref 136–145)
Total Bilirubin: 0.39 mg/dL (ref 0.20–1.20)
Total Protein: 6.6 g/dL (ref 6.4–8.3)

## 2017-01-18 MED ORDER — FAMOTIDINE IN NACL 20-0.9 MG/50ML-% IV SOLN
INTRAVENOUS | Status: AC
  Filled 2017-01-18: qty 50

## 2017-01-18 MED ORDER — SODIUM CHLORIDE 0.9% FLUSH
10.0000 mL | INTRAVENOUS | Status: DC | PRN
  Administered 2017-01-18: 10 mL
  Filled 2017-01-18: qty 10

## 2017-01-18 MED ORDER — DIPHENHYDRAMINE HCL 50 MG/ML IJ SOLN
INTRAMUSCULAR | Status: AC
  Filled 2017-01-18: qty 1

## 2017-01-18 MED ORDER — PACLITAXEL CHEMO INJECTION 300 MG/50ML
80.0000 mg/m2 | Freq: Once | INTRAVENOUS | Status: AC
  Administered 2017-01-18: 162 mg via INTRAVENOUS
  Filled 2017-01-18: qty 27

## 2017-01-18 MED ORDER — SODIUM CHLORIDE 0.9% FLUSH
10.0000 mL | Freq: Once | INTRAVENOUS | Status: AC
  Administered 2017-01-18: 10 mL
  Filled 2017-01-18: qty 10

## 2017-01-18 MED ORDER — HEPARIN SOD (PORK) LOCK FLUSH 100 UNIT/ML IV SOLN
500.0000 [IU] | Freq: Once | INTRAVENOUS | Status: AC | PRN
  Administered 2017-01-18: 500 [IU]
  Filled 2017-01-18: qty 5

## 2017-01-18 MED ORDER — FAMOTIDINE IN NACL 20-0.9 MG/50ML-% IV SOLN
20.0000 mg | Freq: Once | INTRAVENOUS | Status: AC
  Administered 2017-01-18: 20 mg via INTRAVENOUS

## 2017-01-18 MED ORDER — SODIUM CHLORIDE 0.9 % IV SOLN
Freq: Once | INTRAVENOUS | Status: AC
  Administered 2017-01-18: 13:00:00 via INTRAVENOUS

## 2017-01-18 MED ORDER — DIPHENHYDRAMINE HCL 50 MG/ML IJ SOLN
50.0000 mg | Freq: Once | INTRAMUSCULAR | Status: AC
  Administered 2017-01-18: 50 mg via INTRAVENOUS

## 2017-01-18 MED ORDER — SODIUM CHLORIDE 0.9 % IV SOLN
20.0000 mg | Freq: Once | INTRAVENOUS | Status: AC
  Administered 2017-01-18: 20 mg via INTRAVENOUS
  Filled 2017-01-18: qty 2

## 2017-01-18 NOTE — Patient Instructions (Signed)
Implanted Port Home Guide An implanted port is a type of central line that is placed under the skin. Central lines are used to provide IV access when treatment or nutrition needs to be given through a person's veins. Implanted ports are used for long-term IV access. An implanted port may be placed because:  You need IV medicine that would be irritating to the small veins in your hands or arms.  You need long-term IV medicines, such as antibiotics.  You need IV nutrition for a long period.  You need frequent blood draws for lab tests.  You need dialysis.  Implanted ports are usually placed in the chest area, but they can also be placed in the upper arm, the abdomen, or the leg. An implanted port has two main parts:  Reservoir. The reservoir is round and will appear as a small, raised area under your skin. The reservoir is the part where a needle is inserted to give medicines or draw blood.  Catheter. The catheter is a thin, flexible tube that extends from the reservoir. The catheter is placed into a large vein. Medicine that is inserted into the reservoir goes into the catheter and then into the vein.  How will I care for my incision site? Do not get the incision site wet. Bathe or shower as directed by your health care provider. How is my port accessed? Special steps must be taken to access the port:  Before the port is accessed, a numbing cream can be placed on the skin. This helps numb the skin over the port site.  Your health care provider uses a sterile technique to access the port. ? Your health care provider must put on a mask and sterile gloves. ? The skin over your port is cleaned carefully with an antiseptic and allowed to dry. ? The port is gently pinched between sterile gloves, and a needle is inserted into the port.  Only "non-coring" port needles should be used to access the port. Once the port is accessed, a blood return should be checked. This helps ensure that the port  is in the vein and is not clogged.  If your port needs to remain accessed for a constant infusion, a clear (transparent) bandage will be placed over the needle site. The bandage and needle will need to be changed every week, or as directed by your health care provider.  Keep the bandage covering the needle clean and dry. Do not get it wet. Follow your health care provider's instructions on how to take a shower or bath while the port is accessed.  If your port does not need to stay accessed, no bandage is needed over the port.  What is flushing? Flushing helps keep the port from getting clogged. Follow your health care provider's instructions on how and when to flush the port. Ports are usually flushed with saline solution or a medicine called heparin. The need for flushing will depend on how the port is used.  If the port is used for intermittent medicines or blood draws, the port will need to be flushed: ? After medicines have been given. ? After blood has been drawn. ? As part of routine maintenance.  If a constant infusion is running, the port may not need to be flushed.  How long will my port stay implanted? The port can stay in for as long as your health care provider thinks it is needed. When it is time for the port to come out, surgery will be   done to remove it. The procedure is similar to the one performed when the port was put in. When should I seek immediate medical care? When you have an implanted port, you should seek immediate medical care if:  You notice a bad smell coming from the incision site.  You have swelling, redness, or drainage at the incision site.  You have more swelling or pain at the port site or the surrounding area.  You have a fever that is not controlled with medicine.  This information is not intended to replace advice given to you by your health care provider. Make sure you discuss any questions you have with your health care provider. Document  Released: 05/07/2005 Document Revised: 10/13/2015 Document Reviewed: 01/12/2013 Elsevier Interactive Patient Education  2017 [employer]  

## 2017-01-18 NOTE — Progress Notes (Signed)
Patient Care Team: [name], MD as PCP - General (Family Medicine) Nicholas Lose, MD as Consulting Physician (Hematology and Oncology)  DIAGNOSIS:  Encounter Diagnosis  Name Primary?  . Malignant neoplasm of lower-outer quadrant of left breast of female, estrogen receptor positive ([place])     SUMMARY OF ONCOLOGIC HISTORY:   Malignant neoplasm of lower-outer quadrant of left breast of female, estrogen receptor positive ([place])   06/26/2016 Initial Diagnosis    Left breast biopsy 3:00: IDC with DCIS, grade 3, ER 80%, PR 20%, Ki-67 60%, HER-2 negative ratio 1.18; palpable lump: 1.8 cm lesion, no lymph nodes, T1 cN0 stage I a clinical stage      07/26/2016 Surgery    Left lumpectomy: IDC 1.8 cm, with DCIS, margins negative, 0/2 lymph nodes, ER 80%, PR 20%, HER-2 negative ratio 1.18, Ki-67 60%, T1cN0 stage IA       08/21/2016 Oncotype testing    Oncotype DX recurrence score 51, risk of recurrence 34%      08/21/2016 Genetic Testing    Genetic counseling and testing for hereditary cancer syndromes performed on 08/21/2016. Results are negative for pathogenic mutations in 46 genes analyzed by [facility]'s Common Hereditary Cancers Panel. Results are dated 09/14/2016. Genes tested: APC, ATM, AXIN2, BARD1, BMPR1A, BRCA1, BRCA2, BRIP1, CDH1, CDKN2A, CHEK2, CTNNA1, DICER1, EPCAM, GREM1, HOXB13, KIT, MEN1, MLH1, MSH2, MSH3, MSH6, MUTYH, NBN, NF1, NTHL1, PALB2, PDGFRA, PMS2, POLD1, POLE, PTEN, RAD50, RAD51C, RAD51D, SDHA, SDHB, SDHC, SDHD, SMAD4, SMARCA4, STK11, TP53, TSC1, TSC2, and VHL.        11/02/2016 -  Chemotherapy    Dose dense Adriamycin and Cytoxan 4 followed by Taxol weekly 12       CHIEF COMPLIANT: Cycle 1 Taxol today  INTERVAL HISTORY: [name] is a 27 with above-mentioned history of left breast cancer currently on adjuvant chemotherapy. She finished 4 cycles of dose dense Adriamycin Cytoxan. After the last cycle she felt profoundly fatigued and weak and had to wait an  extra week to get stronger. She went on a vacation to the beach and that has also helped her spirits. She denies any nausea vomiting. She does have diffuse body aches and pains. She also has postnasal drip and also a dry chronic cough.  REVIEW OF SYSTEMS:   Constitutional: Denies fevers, chills or abnormal weight loss Eyes: Denies blurriness of vision Ears, nose, mouth, throat, and face: Denies mucositis or sore throat Respiratory: Dry chronic cough Cardiovascular: Denies palpitation, chest discomfort Gastrointestinal:  Denies nausea, heartburn or change in bowel habits Skin: Denies abnormal skin rashes Lymphatics: Denies new lymphadenopathy or easy bruising Neurological:Denies numbness, tingling or new weaknesses Behavioral/Psych: Mood is stable, no new changes  Extremities: No lower extremity edema  All other systems were reviewed with the patient and are negative.  I have reviewed the past medical history, past surgical history, social history and family history with the patient and they are unchanged from previous note.  ALLERGIES:  is allergic to aspirin; oxycodone hcl; propoxyphene n-acetaminophen; tramadol; adhesive [tape]; and prednisone.  MEDICATIONS:  Current Outpatient Prescriptions  Medication Sig Dispense Refill  . acetaminophen (TYLENOL) 500 MG tablet Take 1,000 mg by mouth every 6 (six) hours as needed (pain).    Marland Kitchen acetaminophen-codeine (TYLENOL #3) 300-30 MG tablet Take 1 tablet by mouth every 6 (six) hours as needed for moderate pain. 20 tablet 0  . albuterol (PROAIR HFA) 108 (90 Base) MCG/ACT inhaler Inhale 2 puffs into the lungs every 6 (six) hours as needed for wheezing  or shortness of breath.    . Calcium Citrate-Vitamin D (CALCIUM CITRATE +D PO) Take 2 tablets by mouth daily. Calcium 600 mg     . clindamycin (CLEOCIN) 300 MG capsule Take 1 capsule (300 mg total) by mouth 4 (four) times daily. X 7 days 28 capsule 0  . cyclobenzaprine (FLEXERIL) 5 MG tablet Take 5 mg  by mouth 3 (three) times daily as needed for muscle spasms.    Marland Kitchen gabapentin (NEURONTIN) 300 MG capsule Take 300 mg by mouth 3 (three) times daily.    Marland Kitchen lidocaine-prilocaine (EMLA) cream Apply to affected area once 30 g 3  . lisinopril (PRINIVIL,ZESTRIL) 20 MG tablet Take 20 mg by mouth daily.    . mometasone (ELOCON) 0.1 % cream Apply 1 application topically daily as needed (rash - summer eczema). Apply to arms    . Multiple Vitamin (MULTIVITAMIN WITH MINERALS) TABS tablet Take 1 tablet by mouth at bedtime.    . Multiple Vitamins-Iron (MULTI-VITAMIN/IRON PO) Take 1 tablet by mouth daily.    Marland Kitchen omeprazole (PRILOSEC) 40 MG capsule Take 1 capsule (40 mg total) by mouth daily. 30 capsule 5  . ondansetron (ZOFRAN) 8 MG tablet Take 1 tablet (8 mg total) by mouth 2 (two) times daily as needed. Start on the third day after chemotherapy. (Patient not taking: Reported on 11/02/2016) 30 tablet 1  . oxybutynin (DITROPAN) 5 MG tablet Take 1 tablet (5 mg total) by mouth 3 (three) times daily. 90 tablet 0  . prochlorperazine (COMPAZINE) 10 MG tablet Take 1 tablet (10 mg total) by mouth every 6 (six) hours as needed (Nausea or vomiting). 30 tablet 1  . ranitidine (ZANTAC) 300 MG capsule Take 1 capsule (300 mg total) by mouth 2 (two) times daily. 60 capsule 3  . silver sulfADIAZINE (SILVADENE) 1 % cream Apply 1 application topically 2 (two) times daily as needed (stomach tears). 50 g 0  . sucralfate (CARAFATE) 1 g tablet Take 1 tablet (1 g total) by mouth 4 (four) times daily -  with meals and at bedtime. 120 tablet 0   No current facility-administered medications for this visit.     PHYSICAL EXAMINATION: ECOG PERFORMANCE STATUS: 1 - Symptomatic but completely ambulatory  Vitals:   01/18/17 1134  BP: 136/68  Pulse: (!) 58  Resp: 18  Temp: 98.4 F (36.9 C)  SpO2: 100%   Filed Weights   01/18/17 1134  Weight: 198 lb 6.4 oz (90 kg)    GENERAL:alert, no distress and comfortable SKIN: skin color,  texture, turgor are normal, no rashes or significant lesions EYES: normal, Conjunctiva are pink and non-injected, sclera clear OROPHARYNX:no exudate, no erythema and lips, buccal mucosa, and tongue normal  NECK: supple, thyroid normal size, non-tender, without nodularity LYMPH:  no palpable lymphadenopathy in the cervical, axillary or inguinal LUNGS: clear to auscultation and percussion with normal breathing effort HEART: regular rate & rhythm and no murmurs and no lower extremity edema ABDOMEN:abdomen soft, non-tender and normal bowel sounds MUSCULOSKELETAL:no cyanosis of digits and no clubbing  NEURO: alert & oriented x 3 with fluent speech, no focal motor/sensory deficits EXTREMITIES: No lower extremity edema   LABORATORY DATA:  I have reviewed the data as listed   Chemistry      Component Value Date/Time   NA 137 01/02/2017 1435   K 4.2 01/02/2017 1435   CL 104 12/14/2016 0120   CO2 25 01/02/2017 1435   BUN 10.3 01/02/2017 1435   CREATININE 0.7 01/02/2017 1435  Component Value Date/Time   CALCIUM 9.4 01/02/2017 1435   ALKPHOS 85 01/02/2017 1435   AST 15 01/02/2017 1435   ALT 7 01/02/2017 1435   BILITOT 0.43 01/02/2017 1435       Lab Results  Component Value Date   WBC 4.6 01/18/2017   HGB 9.2 (L) 01/18/2017   HCT 28.1 (L) 01/18/2017   MCV 98.6 01/18/2017   PLT 257 01/18/2017   NEUTROABS 2.9 01/18/2017    ASSESSMENT & PLAN:  Malignant neoplasm of lower-outer quadrant of left breast of female, estrogen receptor positive ([place]) 07/26/2016: Left lumpectomy: IDC 1.8 cm, with DCIS, margins negative, 0/2 lymph nodes, ER 80%, PR 20%, HER-2 negative ratio 1.18, Ki-67 60%, T1cN0 stage IA  Oncotype DX score 51: 10 year risk of recurrence greater than 34%, extremely high risk  Recommendation: 1. Adjuvant chemotherapy with Adriamycin/Cytoxan x 4, then weekly Taxol x 12 2. Adjuvant radiation therapy followed by 3. Adjuvant antiestrogen  therapy -------------------------------------------------------------------------------------------------------------------------------- Current treatment: Cycle 1 Taxol Chemotherapy toxicities: 1. Alopecia  2.Fatigue: Quite severe 3. Abdominal discomfort: With abdominal cramps 4. Thrush: Resolved with Diflucan 5. Parotitis: Received oralclindamycin. Improved Return to clinic in one week for toxicity check and evaluation   I spent 25 minutes talking to the patient of which more than half was spent in counseling and coordination of care.  No orders of the defined types were placed in this encounter.  The patient has a good understanding of the overall plan. she agrees with it. she will call with any problems that may develop before the next visit here.   Rulon Eisenmenger, MD 01/18/17

## 2017-01-18 NOTE — Patient Instructions (Signed)
[facility] Discharge Instructions for Patients Receiving Chemotherapy  Today you received the following chemotherapy agents TaxolTo help prevent nausea and vomiting after your treatment, we encourage you to take your nausea medication   prochlorperazine (COMPAZINE) 10 MG tablet 30 tablet 1 09/17/2016    Sig - Route: Take 1 tablet (10 mg total) by mouth every 6 (six) hours as needed (Nausea or vomiting). - Oral   Sent to pharmacy as: prochlorperazine (COMPAZINE) 10 MG tablet    ondansetron (ZOFRAN) 8 MG tablet 30 tablet 1 09/17/2016    Sig - Route: Take 1 tablet (8 mg total) by mouth 2 (two) times daily as needed. Start on the third day after chemotherapy      If you develop nausea and vomiting that is not controlled by your nausea medication, call the clinic.   BELOW ARE SYMPTOMS THAT SHOULD BE REPORTED IMMEDIATELY:  *FEVER GREATER THAN 100.5 F  *CHILLS WITH OR WITHOUT FEVER  NAUSEA AND VOMITING THAT IS NOT CONTROLLED WITH YOUR NAUSEA MEDICATION  *UNUSUAL SHORTNESS OF BREATH  *UNUSUAL BRUISING OR BLEEDING  TENDERNESS IN MOUTH AND THROAT WITH OR WITHOUT PRESENCE OF ULCERS  *URINARY PROBLEMS  *BOWEL PROBLEMS  UNUSUAL RASH Items with * indicate a potential emergency and should be followed up as soon as possible.  Feel free to call the clinic you have any questions or concerns. The clinic phone number is [phone].  Please show the [place] at check-in to the Emergency Department and triage nurse.

## 2017-01-18 NOTE — Assessment & Plan Note (Signed)
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 Adjuvant chemotherapy with Adriamycin/Cytoxan x 4, then weekly Taxol x 12 2. Adjuvant radiation therapy followed by 3. Adjuvant antiestrogen therapy -------------------------------------------------------------------------------------------------------------------------------- Current treatment: Cycle 1 Taxol Chemotherapy toxicities: 1. Alopecia  2.Fatigue: Quite severe 3. Abdominal discomfort: With abdominal cramps 4. Thrush: Resolved with Diflucan 5. Parotitis: Received oralclindamycin. Patient is seeing ENT Return to clinic in one week for toxicity check and evaluation

## 2017-01-22 ENCOUNTER — Telehealth: Payer: Self-pay

## 2017-01-22 NOTE — Telephone Encounter (Signed)
Called pt to do a follow up chemo call after her 1st time taxol treatment last Friday. LVM with call back number.

## 2017-01-25 ENCOUNTER — Other Ambulatory Visit (HOSPITAL_BASED_OUTPATIENT_CLINIC_OR_DEPARTMENT_OTHER): Payer: Medicare Other

## 2017-01-25 ENCOUNTER — Ambulatory Visit (HOSPITAL_BASED_OUTPATIENT_CLINIC_OR_DEPARTMENT_OTHER): Payer: Medicare Other | Admitting: Hematology and Oncology

## 2017-01-25 ENCOUNTER — Encounter: Payer: Self-pay | Admitting: Hematology and Oncology

## 2017-01-25 ENCOUNTER — Ambulatory Visit (HOSPITAL_BASED_OUTPATIENT_CLINIC_OR_DEPARTMENT_OTHER): Payer: Medicare Other

## 2017-01-25 ENCOUNTER — Ambulatory Visit: Payer: Medicare Other

## 2017-01-25 DIAGNOSIS — D6481 Anemia due to antineoplastic chemotherapy: Secondary | ICD-10-CM

## 2017-01-25 DIAGNOSIS — D701 Agranulocytosis secondary to cancer chemotherapy: Secondary | ICD-10-CM | POA: Diagnosis not present

## 2017-01-25 DIAGNOSIS — Z5111 Encounter for antineoplastic chemotherapy: Secondary | ICD-10-CM | POA: Diagnosis not present

## 2017-01-25 DIAGNOSIS — Z170 Estrogen receptor positive status [ER+]: Secondary | ICD-10-CM

## 2017-01-25 DIAGNOSIS — Z95828 Presence of other vascular implants and grafts: Secondary | ICD-10-CM

## 2017-01-25 DIAGNOSIS — C50512 Malignant neoplasm of lower-outer quadrant of left female breast: Secondary | ICD-10-CM

## 2017-01-25 LAB — COMPREHENSIVE METABOLIC PANEL
ALT: 10 U/L (ref 0–55)
AST: 15 U/L (ref 5–34)
Albumin: 3.9 g/dL (ref 3.5–5.0)
Alkaline Phosphatase: 59 U/L (ref 40–150)
Anion Gap: 7 mEq/L (ref 3–11)
BUN: 18.1 mg/dL (ref 7.0–26.0)
CO2: 24 mEq/L (ref 22–29)
Calcium: 9.3 mg/dL (ref 8.4–10.4)
Chloride: 106 mEq/L (ref 98–109)
Creatinine: 0.7 mg/dL (ref 0.6–1.1)
EGFR: >90 mL/min/{1.73_m2} (ref >90)
Glucose: 90 mg/dl (ref 70–140)
Potassium: 3.9 mEq/L (ref 3.5–5.1)
Sodium: 137 mEq/L (ref 136–145)
Total Bilirubin: 0.47 mg/dL (ref 0.20–1.20)
Total Protein: 6.9 g/dL (ref 6.4–8.3)

## 2017-01-25 LAB — CBC WITH DIFFERENTIAL/PLATELET
BASO%: 2.2 % — ABNORMAL HIGH (ref 0.0–2.0)
Basophils Absolute: 0.1 10*3/uL (ref 0.0–0.1)
EOS%: 2 % (ref 0.0–7.0)
Eosinophils Absolute: 0.1 10*3/uL (ref 0.0–0.5)
HCT: 29.1 % — ABNORMAL LOW (ref 34.8–46.6)
HGB: 9.8 g/dL — ABNORMAL LOW (ref 11.6–15.9)
LYMPH%: 18.5 % (ref 14.0–49.7)
MCH: 33.1 pg (ref 25.1–34.0)
MCHC: 33.5 g/dL (ref 31.5–36.0)
MCV: 98.6 fL (ref 79.5–101.0)
MONO#: 0.3 10*3/uL (ref 0.1–0.9)
MONO%: 9.9 % (ref 0.0–14.0)
NEUT#: 2 10*3/uL (ref 1.5–6.5)
NEUT%: 67.4 % (ref 38.4–76.8)
Platelets: 204 10*3/uL (ref 145–400)
RBC: 2.95 10*6/uL — ABNORMAL LOW (ref 3.70–5.45)
RDW: 16.7 % — ABNORMAL HIGH (ref 11.2–14.5)
WBC: 3 10*3/uL — ABNORMAL LOW (ref 3.9–10.3)
lymph#: 0.5 10*3/uL — ABNORMAL LOW (ref 0.9–3.3)

## 2017-01-25 MED ORDER — FAMOTIDINE IN NACL 20-0.9 MG/50ML-% IV SOLN
INTRAVENOUS | Status: AC
  Filled 2017-01-25: qty 50

## 2017-01-25 MED ORDER — PACLITAXEL CHEMO INJECTION 300 MG/50ML
65.0000 mg/m2 | Freq: Once | INTRAVENOUS | Status: AC
  Administered 2017-01-25: 132 mg via INTRAVENOUS
  Filled 2017-01-25: qty 22

## 2017-01-25 MED ORDER — DEXAMETHASONE SODIUM PHOSPHATE 100 MG/10ML IJ SOLN
20.0000 mg | Freq: Once | INTRAMUSCULAR | Status: AC
  Administered 2017-01-25: 20 mg via INTRAVENOUS
  Filled 2017-01-25: qty 2

## 2017-01-25 MED ORDER — DIPHENHYDRAMINE HCL 50 MG/ML IJ SOLN
50.0000 mg | Freq: Once | INTRAMUSCULAR | Status: AC
  Administered 2017-01-25: 50 mg via INTRAVENOUS

## 2017-01-25 MED ORDER — SODIUM CHLORIDE 0.9 % IV SOLN
Freq: Once | INTRAVENOUS | Status: AC
  Administered 2017-01-25: 12:00:00 via INTRAVENOUS

## 2017-01-25 MED ORDER — DIPHENHYDRAMINE HCL 50 MG/ML IJ SOLN
INTRAMUSCULAR | Status: AC
  Filled 2017-01-25: qty 1

## 2017-01-25 MED ORDER — SODIUM CHLORIDE 0.9% FLUSH
10.0000 mL | INTRAVENOUS | Status: DC | PRN
  Administered 2017-01-25: 10 mL
  Filled 2017-01-25: qty 10

## 2017-01-25 MED ORDER — SODIUM CHLORIDE 0.9% FLUSH
10.0000 mL | Freq: Once | INTRAVENOUS | Status: AC
  Administered 2017-01-25: 10 mL
  Filled 2017-01-25: qty 10

## 2017-01-25 MED ORDER — FAMOTIDINE IN NACL 20-0.9 MG/50ML-% IV SOLN
20.0000 mg | Freq: Once | INTRAVENOUS | Status: AC
  Administered 2017-01-25: 20 mg via INTRAVENOUS

## 2017-01-25 MED ORDER — HEPARIN SOD (PORK) LOCK FLUSH 100 UNIT/ML IV SOLN
500.0000 [IU] | Freq: Once | INTRAVENOUS | Status: AC | PRN
  Administered 2017-01-25: 500 [IU]
  Filled 2017-01-25: qty 5

## 2017-01-25 NOTE — Patient Instructions (Signed)
[facility] Discharge Instructions for Patients Receiving Chemotherapy  Today you received the following chemotherapy agents paclitaxel (Taxol).  To help prevent nausea and vomiting after your treatment, we encourage you to take your nausea medication as prescribed.   If you develop nausea and vomiting that is not controlled by your nausea medication, call the clinic.   BELOW ARE SYMPTOMS THAT SHOULD BE REPORTED IMMEDIATELY:  *FEVER GREATER THAN 100.5 F  *CHILLS WITH OR WITHOUT FEVER  NAUSEA AND VOMITING THAT IS NOT CONTROLLED WITH YOUR NAUSEA MEDICATION  *UNUSUAL SHORTNESS OF BREATH  *UNUSUAL BRUISING OR BLEEDING  TENDERNESS IN MOUTH AND THROAT WITH OR WITHOUT PRESENCE OF ULCERS  *URINARY PROBLEMS  *BOWEL PROBLEMS  UNUSUAL RASH Items with * indicate a potential emergency and should be followed up as soon as possible.  Feel free to call the clinic you have any questions or concerns. The clinic phone number is [phone].  Please show the CHEMO ALERT CARD at check-in to the Emergency Department and triage nurse.   

## 2017-01-25 NOTE — Assessment & Plan Note (Signed)
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 Adjuvant chemotherapy with Adriamycin/Cytoxan x 4, then weekly Taxol x 12 2. Adjuvant radiation therapy followed by 3. Adjuvant antiestrogen therapy -------------------------------------------------------------------------------------------------------------------------------- Current treatment: Cycle 2 Taxol Chemotherapy toxicities: 1. Alopecia  2.Fatigue: Quite severe 3. Abdominal discomfort: With abdominal cramps 4. Thrush: Resolved with Diflucan 5. Parotitis: Received oralclindamycin. Improved  Return to clinic in 2 weeks for toxicity check and evaluation and weekly for Taxol treatments

## 2017-01-25 NOTE — Progress Notes (Signed)
Patient Care Team: [name], MD as PCP - General (Family Medicine) Nicholas Lose, MD as Consulting Physician (Hematology and Oncology)  DIAGNOSIS:  Encounter Diagnosis  Name Primary?  . Malignant neoplasm of lower-outer quadrant of left breast of female, estrogen receptor positive ([place])     SUMMARY OF ONCOLOGIC HISTORY:   Malignant neoplasm of lower-outer quadrant of left breast of female, estrogen receptor positive ([place])   06/26/2016 Initial Diagnosis    Left breast biopsy 3:00: IDC with DCIS, grade 3, ER 80%, PR 20%, Ki-67 60%, HER-2 negative ratio 1.18; palpable lump: 1.8 cm lesion, no lymph nodes, T1 cN0 stage I a clinical stage      07/26/2016 Surgery    Left lumpectomy: IDC 1.8 cm, with DCIS, margins negative, 0/2 lymph nodes, ER 80%, PR 20%, HER-2 negative ratio 1.18, Ki-67 60%, T1cN0 stage IA       08/21/2016 Oncotype testing    Oncotype DX recurrence score 51, risk of recurrence 34%      08/21/2016 Genetic Testing    Genetic counseling and testing for hereditary cancer syndromes performed on 08/21/2016. Results are negative for pathogenic mutations in 46 genes analyzed by [employer]'s Common Hereditary Cancers Panel. Results are dated 09/14/2016. Genes tested: APC, ATM, AXIN2, BARD1, BMPR1A, BRCA1, BRCA2, BRIP1, CDH1, CDKN2A, CHEK2, CTNNA1, DICER1, EPCAM, GREM1, HOXB13, KIT, MEN1, MLH1, MSH2, MSH3, MSH6, MUTYH, NBN, NF1, NTHL1, PALB2, PDGFRA, PMS2, POLD1, POLE, PTEN, RAD50, RAD51C, RAD51D, SDHA, SDHB, SDHC, SDHD, SMAD4, SMARCA4, STK11, TP53, TSC1, TSC2, and VHL.        11/02/2016 -  Chemotherapy    Dose dense Adriamycin and Cytoxan 4 followed by Taxol weekly 12       CHIEF COMPLIANT: Cycle 2 Taxol  INTERVAL HISTORY: [name] is a 56 year old with above-mentioned history of from left breast cancer treated with lumpectomy and is currently on adjuvant chemotherapy. She is currently on Taxol. Today is cycle 2. she tolerated cycle 1 fairly well. She had nausea  after the stress cycle of Taxol. She also had diffuse body aches and pains. She has difficulty with sleeping at night. She feels drowsy and weak throughout the day.  REVIEW OF SYSTEMS:   Constitutional: Denies fevers, chills or abnormal weight loss Eyes: Denies blurriness of vision Ears, nose, mouth, throat, and face: Denies mucositis or sore throat Respiratory: Denies cough, dyspnea or wheezes Cardiovascular: Denies palpitation, chest discomfort Gastrointestinal:  Denies nausea, heartburn or change in bowel habits Skin: Denies abnormal skin rashes Lymphatics: Denies new lymphadenopathy or easy bruising Neurological:Denies numbness, tingling or new weaknesses Behavioral/Psych: Mood is stable, no new changes  Extremities: No lower extremity edema Breast:  denies any pain or lumps or nodules in either breasts All other systems were reviewed with the patient and are negative.  I have reviewed the past medical history, past surgical history, social history and family history with the patient and they are unchanged from previous note.  ALLERGIES:  is allergic to aspirin; oxycodone hcl; propoxyphene n-acetaminophen; tramadol; adhesive [tape]; and prednisone.  MEDICATIONS:  Current Outpatient Prescriptions  Medication Sig Dispense Refill  . acetaminophen (TYLENOL) 500 MG tablet Take 1,000 mg by mouth every 6 (six) hours as needed (pain).    Marland Kitchen acetaminophen-codeine (TYLENOL #3) 300-30 MG tablet Take 1 tablet by mouth every 6 (six) hours as needed for moderate pain. 20 tablet 0  . albuterol (PROAIR HFA) 108 (90 Base) MCG/ACT inhaler Inhale 2 puffs into the lungs every 6 (six) hours as needed for wheezing or shortness of  breath.    . Calcium Citrate-Vitamin D (CALCIUM CITRATE +D PO) Take 2 tablets by mouth daily. Calcium 600 mg     . cyclobenzaprine (FLEXERIL) 5 MG tablet Take 5 mg by mouth 3 (three) times daily as needed for muscle spasms.    Marland Kitchen gabapentin (NEURONTIN) 300 MG capsule Take 300 mg by  mouth 3 (three) times daily.    Marland Kitchen lidocaine-prilocaine (EMLA) cream Apply to affected area once 30 g 3  . lisinopril (PRINIVIL,ZESTRIL) 20 MG tablet Take 20 mg by mouth daily.    . mometasone (ELOCON) 0.1 % cream Apply 1 application topically daily as needed (rash - summer eczema). Apply to arms    . Multiple Vitamin (MULTIVITAMIN WITH MINERALS) TABS tablet Take 1 tablet by mouth at bedtime.    . Multiple Vitamins-Iron (MULTI-VITAMIN/IRON PO) Take 1 tablet by mouth daily.    Marland Kitchen omeprazole (PRILOSEC) 40 MG capsule Take 1 capsule (40 mg total) by mouth daily. 30 capsule 5  . ondansetron (ZOFRAN) 8 MG tablet Take 1 tablet (8 mg total) by mouth 2 (two) times daily as needed. Start on the third day after chemotherapy. (Patient not taking: Reported on 11/02/2016) 30 tablet 1  . oxybutynin (DITROPAN) 5 MG tablet Take 1 tablet (5 mg total) by mouth 3 (three) times daily. 90 tablet 0  . prochlorperazine (COMPAZINE) 10 MG tablet Take 1 tablet (10 mg total) by mouth every 6 (six) hours as needed (Nausea or vomiting). 30 tablet 1  . ranitidine (ZANTAC) 300 MG capsule Take 1 capsule (300 mg total) by mouth 2 (two) times daily. 60 capsule 3  . silver sulfADIAZINE (SILVADENE) 1 % cream Apply 1 application topically 2 (two) times daily as needed (stomach tears). 50 g 0  . sucralfate (CARAFATE) 1 g tablet Take 1 tablet (1 g total) by mouth 4 (four) times daily -  with meals and at bedtime. 120 tablet 0   No current facility-administered medications for this visit.    Facility-Administered Medications Ordered in Other Visits  Medication Dose Route Frequency Provider Last Rate Last Dose  . 0.9 %  sodium chloride infusion   Intravenous Once Nicholas Lose, MD      . dexamethasone (DECADRON) 20 mg in sodium chloride 0.9 % 50 mL IVPB  20 mg Intravenous Once Nicholas Lose, MD   20 mg at 01/25/17 1243  . famotidine (PEPCID) IVPB 20 mg premix  20 mg Intravenous Once Nicholas Lose, MD      . heparin lock flush 100 unit/mL   500 Units Intracatheter Once PRN Nicholas Lose, MD      . PACLitaxel (TAXOL) 132 mg in dextrose 5 % 250 mL chemo infusion (</= '80mg'$ /m2)  65 mg/m2 (Treatment Plan Recorded) Intravenous Once Nicholas Lose, MD      . sodium chloride flush (NS) 0.9 % injection 10 mL  10 mL Intracatheter PRN Nicholas Lose, MD        PHYSICAL EXAMINATION: ECOG PERFORMANCE STATUS: 1 - Symptomatic but completely ambulatory  Vitals:   01/25/17 1107  BP: 127/72  Pulse: 63  Resp: 18  Temp: (!) 97.5 F (36.4 C)  SpO2: 100%   Filed Weights   01/25/17 1107  Weight: 192 lb 6.4 oz (87.3 kg)    GENERAL:alert, no distress and comfortable SKIN: skin color, texture, turgor are normal, no rashes or significant lesions EYES: normal, Conjunctiva are pink and non-injected, sclera clear OROPHARYNX:no exudate, no erythema and lips, buccal mucosa, and tongue normal  NECK: supple, thyroid normal size,  non-tender, without nodularity LYMPH:  no palpable lymphadenopathy in the cervical, axillary or inguinal LUNGS: clear to auscultation and percussion with normal breathing effort HEART: regular rate & rhythm and no murmurs and no lower extremity edema ABDOMEN:abdomen soft, non-tender and normal bowel sounds MUSCULOSKELETAL:no cyanosis of digits and no clubbing  NEURO: alert & oriented x 3 with fluent speech, no focal motor/sensory deficits EXTREMITIES: No lower extremity edema  LABORATORY DATA:  I have reviewed the data as listed   Chemistry      Component Value Date/Time   NA 137 01/25/2017 1002   K 3.9 01/25/2017 1002   CL 104 12/14/2016 0120   CO2 24 01/25/2017 1002   BUN 18.1 01/25/2017 1002   CREATININE 0.7 01/25/2017 1002      Component Value Date/Time   CALCIUM 9.3 01/25/2017 1002   ALKPHOS 59 01/25/2017 1002   AST 15 01/25/2017 1002   ALT 10 01/25/2017 1002   BILITOT 0.47 01/25/2017 1002       Lab Results  Component Value Date   WBC 3.0 (L) 01/25/2017   HGB 9.8 (L) 01/25/2017   HCT 29.1 (L)  01/25/2017   MCV 98.6 01/25/2017   PLT 204 01/25/2017   NEUTROABS 2.0 01/25/2017    ASSESSMENT & PLAN:  Malignant neoplasm of lower-outer quadrant of left breast of female, estrogen receptor positive ([place]) 07/26/2016: Left lumpectomy: IDC 1.8 cm, with DCIS, margins negative, 0/2 lymph nodes, ER 80%, PR 20%, HER-2 negative ratio 1.18, Ki-67 60%, T1cN0 stage IA  Oncotype DX score 51: 10 year risk of recurrence greater than 34%, extremely high risk  Recommendation: 1. Adjuvant chemotherapy with Adriamycin/Cytoxan x 4, then weekly Taxol x 12 2. Adjuvant radiation therapy followed by 3. Adjuvant antiestrogen therapy -------------------------------------------------------------------------------------------------------------------------------- Current treatment: Cycle 2 Taxol Chemotherapy toxicities: 1. Alopecia  2.Fatigue: Quite severe 3. Abdominal discomfort: With abdominal cramps 4. Thrush: Resolved with Diflucan 5. Parotitis: Received oralclindamycin. Improved 6. Diffuse body aches and pains 7. Anemia due to chemotherapy  8. Neutropenia due to chemotherapy: Today's ANC is 2000.   I reduced the dosage of Taxol with cycle 2 because of her multiple symptoms.  Return to clinic in 2 weeks for toxicity check and evaluation and weekly for Taxol treatments  I spent 25 minutes talking to the patient of which more than half was spent in counseling and coordination of care.  No orders of the defined types were placed in this encounter.  The patient has a good understanding of the overall plan. she agrees with it. she will call with any problems that may develop before the next visit here.   Rulon Eisenmenger, MD 01/25/17

## 2017-01-31 ENCOUNTER — Telehealth: Payer: Self-pay | Admitting: Hematology and Oncology

## 2017-01-31 NOTE — Telephone Encounter (Signed)
Will send scheduling message to reschedule pt appt.

## 2017-01-31 NOTE — Telephone Encounter (Signed)
Patient called to cancel appointment due to inclement weather on 9/14.  Please send scheduling message for r/s

## 2017-02-01 ENCOUNTER — Ambulatory Visit: Payer: Medicare Other

## 2017-02-01 ENCOUNTER — Ambulatory Visit: Payer: Medicare Other | Admitting: Hematology and Oncology

## 2017-02-01 ENCOUNTER — Other Ambulatory Visit: Payer: Medicare Other

## 2017-02-08 ENCOUNTER — Ambulatory Visit: Payer: Medicare Other

## 2017-02-08 ENCOUNTER — Ambulatory Visit (HOSPITAL_BASED_OUTPATIENT_CLINIC_OR_DEPARTMENT_OTHER): Payer: Medicare Other

## 2017-02-08 ENCOUNTER — Other Ambulatory Visit (HOSPITAL_BASED_OUTPATIENT_CLINIC_OR_DEPARTMENT_OTHER): Payer: Medicare Other

## 2017-02-08 VITALS — BP 114/56 | HR 76 | Temp 99.2°F | Resp 18

## 2017-02-08 DIAGNOSIS — C50512 Malignant neoplasm of lower-outer quadrant of left female breast: Secondary | ICD-10-CM

## 2017-02-08 DIAGNOSIS — Z170 Estrogen receptor positive status [ER+]: Secondary | ICD-10-CM

## 2017-02-08 DIAGNOSIS — Z95828 Presence of other vascular implants and grafts: Secondary | ICD-10-CM

## 2017-02-08 DIAGNOSIS — Z5111 Encounter for antineoplastic chemotherapy: Secondary | ICD-10-CM

## 2017-02-08 LAB — CBC WITH DIFFERENTIAL/PLATELET
BASO%: 0.4 % (ref 0.0–2.0)
Basophils Absolute: 0 10*3/uL (ref 0.0–0.1)
EOS%: 2.1 % (ref 0.0–7.0)
Eosinophils Absolute: 0.1 10*3/uL (ref 0.0–0.5)
HCT: 30.4 % — ABNORMAL LOW (ref 34.8–46.6)
HGB: 10 g/dL — ABNORMAL LOW (ref 11.6–15.9)
LYMPH%: 35.9 % (ref 14.0–49.7)
MCH: 32.3 pg (ref 25.1–34.0)
MCHC: 32.9 g/dL (ref 31.5–36.0)
MCV: 98.1 fL (ref 79.5–101.0)
MONO#: 0.5 10*3/uL (ref 0.1–0.9)
MONO%: 18.3 % — ABNORMAL HIGH (ref 0.0–14.0)
NEUT#: 1.2 10*3/uL — ABNORMAL LOW (ref 1.5–6.5)
NEUT%: 43.3 % (ref 38.4–76.8)
Platelets: 192 10*3/uL (ref 145–400)
RBC: 3.1 10*6/uL — ABNORMAL LOW (ref 3.70–5.45)
RDW: 15.2 % — ABNORMAL HIGH (ref 11.2–14.5)
WBC: 2.8 10*3/uL — ABNORMAL LOW (ref 3.9–10.3)
lymph#: 1 10*3/uL (ref 0.9–3.3)

## 2017-02-08 LAB — COMPREHENSIVE METABOLIC PANEL
ALT: 13 U/L (ref 0–55)
AST: 20 U/L (ref 5–34)
Albumin: 3.9 g/dL (ref 3.5–5.0)
Alkaline Phosphatase: 72 U/L (ref 40–150)
Anion Gap: 7 mEq/L (ref 3–11)
BUN: 9.4 mg/dL (ref 7.0–26.0)
CO2: 26 mEq/L (ref 22–29)
Calcium: 9.2 mg/dL (ref 8.4–10.4)
Chloride: 106 mEq/L (ref 98–109)
Creatinine: 0.7 mg/dL (ref 0.6–1.1)
EGFR: >90 mL/min/{1.73_m2} (ref >90)
Glucose: 95 mg/dl (ref 70–140)
Potassium: 3.7 mEq/L (ref 3.5–5.1)
Sodium: 140 mEq/L (ref 136–145)
Total Bilirubin: 0.45 mg/dL (ref 0.20–1.20)
Total Protein: 7 g/dL (ref 6.4–8.3)

## 2017-02-08 MED ORDER — SODIUM CHLORIDE 0.9 % IV SOLN
Freq: Once | INTRAVENOUS | Status: AC
  Administered 2017-02-08: 12:00:00 via INTRAVENOUS

## 2017-02-08 MED ORDER — FAMOTIDINE IN NACL 20-0.9 MG/50ML-% IV SOLN
INTRAVENOUS | Status: AC
  Filled 2017-02-08: qty 50

## 2017-02-08 MED ORDER — DEXAMETHASONE SODIUM PHOSPHATE 100 MG/10ML IJ SOLN
20.0000 mg | Freq: Once | INTRAMUSCULAR | Status: AC
  Administered 2017-02-08: 20 mg via INTRAVENOUS
  Filled 2017-02-08: qty 2

## 2017-02-08 MED ORDER — DIPHENHYDRAMINE HCL 50 MG/ML IJ SOLN
INTRAMUSCULAR | Status: AC
  Filled 2017-02-08: qty 1

## 2017-02-08 MED ORDER — HEPARIN SOD (PORK) LOCK FLUSH 100 UNIT/ML IV SOLN
500.0000 [IU] | Freq: Once | INTRAVENOUS | Status: AC | PRN
  Administered 2017-02-08: 500 [IU]
  Filled 2017-02-08: qty 5

## 2017-02-08 MED ORDER — SODIUM CHLORIDE 0.9% FLUSH
10.0000 mL | Freq: Once | INTRAVENOUS | Status: AC
  Administered 2017-02-08: 10 mL
  Filled 2017-02-08: qty 10

## 2017-02-08 MED ORDER — FAMOTIDINE IN NACL 20-0.9 MG/50ML-% IV SOLN
20.0000 mg | Freq: Once | INTRAVENOUS | Status: AC
  Administered 2017-02-08: 20 mg via INTRAVENOUS

## 2017-02-08 MED ORDER — DIPHENHYDRAMINE HCL 50 MG/ML IJ SOLN: 25.0000 mg | Freq: Once | INTRAMUSCULAR | Status: DC

## 2017-02-08 MED ORDER — DIPHENHYDRAMINE HCL 50 MG/ML IJ SOLN
25.0000 mg | Freq: Once | INTRAMUSCULAR | Status: AC
  Administered 2017-02-08: 25 mg via INTRAVENOUS

## 2017-02-08 MED ORDER — SODIUM CHLORIDE 0.9% FLUSH
10.0000 mL | INTRAVENOUS | Status: DC | PRN
  Administered 2017-02-08: 10 mL
  Filled 2017-02-08: qty 10

## 2017-02-08 MED ORDER — DEXTROSE 5 % IV SOLN
65.0000 mg/m2 | Freq: Once | INTRAVENOUS | Status: AC
  Administered 2017-02-08: 132 mg via INTRAVENOUS
  Filled 2017-02-08: qty 22

## 2017-02-08 NOTE — Progress Notes (Signed)
Per MD ok to treat with ANC of 1.2.   Patient complained of "muscle spasms" after benadryl administration. Per MD ok to reduce benadryl doe to 25mg  IV. Patient voiced understanding.   Wylene Simmer, BSN, RN 02/08/2017 11:36 AM

## 2017-02-08 NOTE — Patient Instructions (Signed)
[facility] Discharge Instructions for Patients Receiving Chemotherapy  Today you received the following chemotherapy agents paclitaxel (Taxol).  To help prevent nausea and vomiting after your treatment, we encourage you to take your nausea medication as prescribed.   If you develop nausea and vomiting that is not controlled by your nausea medication, call the clinic.   BELOW ARE SYMPTOMS THAT SHOULD BE REPORTED IMMEDIATELY:  *FEVER GREATER THAN 100.5 F  *CHILLS WITH OR WITHOUT FEVER  NAUSEA AND VOMITING THAT IS NOT CONTROLLED WITH YOUR NAUSEA MEDICATION  *UNUSUAL SHORTNESS OF BREATH  *UNUSUAL BRUISING OR BLEEDING  TENDERNESS IN MOUTH AND THROAT WITH OR WITHOUT PRESENCE OF ULCERS  *URINARY PROBLEMS  *BOWEL PROBLEMS  UNUSUAL RASH Items with * indicate a potential emergency and should be followed up as soon as possible.  Feel free to call the clinic you have any questions or concerns. The clinic phone number is [phone].  Please show the CHEMO ALERT CARD at check-in to the Emergency Department and triage nurse.   

## 2017-02-11 MED FILL — OXYBUTYNIN CHLORIDE 5 MG TA: 5 | 30 days supply | Qty: 90 | Fill #0

## 2017-02-11 MED FILL — CYCLOBENZAPRINE 5 MG TABLET: 5 | 30 days supply | Qty: 90 | Fill #1

## 2017-02-11 MED FILL — VALACYCLOVIR HCL 500 MG TAB: 500 | 90 days supply | Qty: 180 | Fill #1

## 2017-02-12 MED FILL — SSD 1% CREAM: 1 | 30 days supply | Qty: 400 | Fill #1

## 2017-02-12 MED FILL — LISINOPRIL 20 MG TABS: 20 | 90 days supply | Qty: 90 | Fill #0

## 2017-02-14 NOTE — Assessment & Plan Note (Signed)
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 Adjuvant chemotherapy with Adriamycin/Cytoxan x 4, then weekly Taxol x 12 2. Adjuvant radiation therapy followed by 3. Adjuvant antiestrogen therapy -------------------------------------------------------------------------------------------------------------------------------- Current treatment: Cycle 4 Taxol Chemotherapy toxicities: 1. Alopecia  2.Fatigue: Quite severe 3. Abdominal discomfort: With abdominal cramps 4. Thrush: Resolved with Diflucan 5. Parotitis: Received oralclindamycin. Improved 6. Diffuse body aches and pains 7. Anemia due to chemotherapy  8. Neutropenia due to chemotherapy: Today's ANC is 2000.   I reduced the dosage of Taxol with cycle 2 because of her multiple symptoms.  Return to clinic in 2 weeks for toxicity check and evaluation and weekly for Taxol treatments

## 2017-02-15 ENCOUNTER — Ambulatory Visit: Payer: Medicare Other

## 2017-02-15 ENCOUNTER — Ambulatory Visit (HOSPITAL_BASED_OUTPATIENT_CLINIC_OR_DEPARTMENT_OTHER): Payer: Medicare Other | Admitting: Hematology and Oncology

## 2017-02-15 ENCOUNTER — Ambulatory Visit (HOSPITAL_BASED_OUTPATIENT_CLINIC_OR_DEPARTMENT_OTHER): Payer: Medicare Other

## 2017-02-15 ENCOUNTER — Other Ambulatory Visit (HOSPITAL_BASED_OUTPATIENT_CLINIC_OR_DEPARTMENT_OTHER): Payer: Medicare Other

## 2017-02-15 DIAGNOSIS — C50512 Malignant neoplasm of lower-outer quadrant of left female breast: Secondary | ICD-10-CM

## 2017-02-15 DIAGNOSIS — G620 Drug-induced polyneuropathy: Secondary | ICD-10-CM | POA: Diagnosis not present

## 2017-02-15 DIAGNOSIS — D701 Agranulocytosis secondary to cancer chemotherapy: Secondary | ICD-10-CM

## 2017-02-15 DIAGNOSIS — Z95828 Presence of other vascular implants and grafts: Secondary | ICD-10-CM

## 2017-02-15 DIAGNOSIS — Z170 Estrogen receptor positive status [ER+]: Secondary | ICD-10-CM

## 2017-02-15 DIAGNOSIS — D6481 Anemia due to antineoplastic chemotherapy: Secondary | ICD-10-CM | POA: Diagnosis not present

## 2017-02-15 DIAGNOSIS — Z5111 Encounter for antineoplastic chemotherapy: Secondary | ICD-10-CM

## 2017-02-15 DIAGNOSIS — T451X5A Adverse effect of antineoplastic and immunosuppressive drugs, initial encounter: Secondary | ICD-10-CM | POA: Insufficient documentation

## 2017-02-15 LAB — CBC WITH DIFFERENTIAL/PLATELET
BASO%: 1.4 % (ref 0.0–2.0)
Basophils Absolute: 0 10*3/uL (ref 0.0–0.1)
EOS%: 4 % (ref 0.0–7.0)
Eosinophils Absolute: 0.1 10*3/uL (ref 0.0–0.5)
HCT: 30.3 % — ABNORMAL LOW (ref 34.8–46.6)
HGB: 10.2 g/dL — ABNORMAL LOW (ref 11.6–15.9)
LYMPH%: 25.5 % (ref 14.0–49.7)
MCH: 33.3 pg (ref 25.1–34.0)
MCHC: 33.7 g/dL (ref 31.5–36.0)
MCV: 98.8 fL (ref 79.5–101.0)
MONO#: 0.2 10*3/uL (ref 0.1–0.9)
MONO%: 8.4 % (ref 0.0–14.0)
NEUT#: 1.6 10*3/uL (ref 1.5–6.5)
NEUT%: 60.7 % (ref 38.4–76.8)
Platelets: 205 10*3/uL (ref 145–400)
RBC: 3.07 10*6/uL — ABNORMAL LOW (ref 3.70–5.45)
RDW: 15.6 % — ABNORMAL HIGH (ref 11.2–14.5)
WBC: 2.6 10*3/uL — ABNORMAL LOW (ref 3.9–10.3)
lymph#: 0.7 10*3/uL — ABNORMAL LOW (ref 0.9–3.3)

## 2017-02-15 LAB — COMPREHENSIVE METABOLIC PANEL
ALT: 9 U/L (ref 0–55)
AST: 15 U/L (ref 5–34)
Albumin: 3.9 g/dL (ref 3.5–5.0)
Alkaline Phosphatase: 70 U/L (ref 40–150)
Anion Gap: 8 mEq/L (ref 3–11)
BUN: 10.6 mg/dL (ref 7.0–26.0)
CO2: 25 mEq/L (ref 22–29)
Calcium: 9.6 mg/dL (ref 8.4–10.4)
Chloride: 106 mEq/L (ref 98–109)
Creatinine: 0.7 mg/dL (ref 0.6–1.1)
EGFR: >90 mL/min/{1.73_m2} (ref >90)
Glucose: 103 mg/dl (ref 70–140)
Potassium: 4.4 mEq/L (ref 3.5–5.1)
Sodium: 138 mEq/L (ref 136–145)
Total Bilirubin: 0.49 mg/dL (ref 0.20–1.20)
Total Protein: 6.9 g/dL (ref 6.4–8.3)

## 2017-02-15 MED ORDER — FAMOTIDINE IN NACL 20-0.9 MG/50ML-% IV SOLN
INTRAVENOUS | Status: AC
  Filled 2017-02-15: qty 50

## 2017-02-15 MED ORDER — SODIUM CHLORIDE 0.9 % IV SOLN
20.0000 mg | Freq: Once | INTRAVENOUS | Status: AC
  Administered 2017-02-15: 20 mg via INTRAVENOUS
  Filled 2017-02-15: qty 2

## 2017-02-15 MED ORDER — PACLITAXEL CHEMO INJECTION 300 MG/50ML
65.0000 mg/m2 | Freq: Once | INTRAVENOUS | Status: AC
  Administered 2017-02-15: 132 mg via INTRAVENOUS
  Filled 2017-02-15: qty 22

## 2017-02-15 MED ORDER — HEPARIN SOD (PORK) LOCK FLUSH 100 UNIT/ML IV SOLN
500.0000 [IU] | Freq: Once | INTRAVENOUS | Status: AC | PRN
  Administered 2017-02-15: 500 [IU]
  Filled 2017-02-15: qty 5

## 2017-02-15 MED ORDER — SODIUM CHLORIDE 0.9 % IV SOLN
Freq: Once | INTRAVENOUS | Status: AC
  Administered 2017-02-15: 12:00:00 via INTRAVENOUS

## 2017-02-15 MED ORDER — FAMOTIDINE IN NACL 20-0.9 MG/50ML-% IV SOLN
20.0000 mg | Freq: Once | INTRAVENOUS | Status: AC
  Administered 2017-02-15: 20 mg via INTRAVENOUS

## 2017-02-15 MED ORDER — SODIUM CHLORIDE 0.9% FLUSH
10.0000 mL | Freq: Once | INTRAVENOUS | Status: AC
  Administered 2017-02-15: 10 mL
  Filled 2017-02-15: qty 10

## 2017-02-15 MED ORDER — DIPHENHYDRAMINE HCL 50 MG/ML IJ SOLN
25.0000 mg | Freq: Once | INTRAMUSCULAR | Status: AC
  Administered 2017-02-15: 25 mg via INTRAVENOUS

## 2017-02-15 MED ORDER — DIPHENHYDRAMINE HCL 50 MG/ML IJ SOLN
INTRAMUSCULAR | Status: AC
  Filled 2017-02-15: qty 1

## 2017-02-15 MED ORDER — SODIUM CHLORIDE 0.9% FLUSH
10.0000 mL | INTRAVENOUS | Status: DC | PRN
  Administered 2017-02-15: 10 mL
  Filled 2017-02-15: qty 10

## 2017-02-15 NOTE — Progress Notes (Signed)
Patient Care Team: [name], MD as PCP - General (Family Medicine) Nicholas Lose, MD as Consulting Physician (Hematology and Oncology)  DIAGNOSIS:  Encounter Diagnosis  Name Primary?  . Malignant neoplasm of lower-outer quadrant of left breast of female, estrogen receptor positive ([place])     SUMMARY OF ONCOLOGIC HISTORY:   Malignant neoplasm of lower-outer quadrant of left breast of female, estrogen receptor positive ([place])   06/26/2016 Initial Diagnosis    Left breast biopsy 3:00: IDC with DCIS, grade 3, ER 80%, PR 20%, Ki-67 60%, HER-2 negative ratio 1.18; palpable lump: 1.8 cm lesion, no lymph nodes, T1 cN0 stage I a clinical stage      07/26/2016 Surgery    Left lumpectomy: IDC 1.8 cm, with DCIS, margins negative, 0/2 lymph nodes, ER 80%, PR 20%, HER-2 negative ratio 1.18, Ki-67 60%, T1cN0 stage IA       08/21/2016 Oncotype testing    Oncotype DX recurrence score 51, risk of recurrence 34%      08/21/2016 Genetic Testing    Genetic counseling and testing for hereditary cancer syndromes performed on 08/21/2016. Results are negative for pathogenic mutations in 46 genes analyzed by [facility]'s Common Hereditary Cancers Panel. Results are dated 09/14/2016. Genes tested: APC, ATM, AXIN2, BARD1, BMPR1A, BRCA1, BRCA2, BRIP1, CDH1, CDKN2A, CHEK2, CTNNA1, DICER1, EPCAM, GREM1, HOXB13, KIT, MEN1, MLH1, MSH2, MSH3, MSH6, MUTYH, NBN, NF1, NTHL1, PALB2, PDGFRA, PMS2, POLD1, POLE, PTEN, RAD50, RAD51C, RAD51D, SDHA, SDHB, SDHC, SDHD, SMAD4, SMARCA4, STK11, TP53, TSC1, TSC2, and VHL.        11/02/2016 -  Chemotherapy    Dose dense Adriamycin and Cytoxan 4 followed by Taxol weekly 12       CHIEF COMPLIANT: Cycle 4 Taxol  INTERVAL HISTORY: [name] is a 56 year old with above-mentioned history of left breast cancer treated with met lumpectomy and is now on adjuvant chemotherapy and today is cycle 4 of Taxol. Overall she continues to have mild neuropathy in the fingers but the dose  of gotten better. She denies any nausea vomiting. She does have fatigue related to chemotherapy.  REVIEW OF SYSTEMS:   Constitutional: Denies fevers, chills or abnormal weight loss Eyes: Denies blurriness of vision Ears, nose, mouth, throat, and face: Denies mucositis or sore throat Respiratory: Denies cough, dyspnea or wheezes Cardiovascular: Denies palpitation, chest discomfort Gastrointestinal:  Denies nausea, heartburn or change in bowel habits Skin: Denies abnormal skin rashes Lymphatics: Denies new lymphadenopathy or easy bruising, Neurological:Denies numbness, tingling or new weaknesses, mild peripheral neuropathy Behavioral/Psych: Mood is stable, no new changes  Extremities: No lower extremity edema Breast:  denies any pain or lumps or nodules in either breasts All other systems were reviewed with the patient and are negative.  I have reviewed the past medical history, past surgical history, social history and family history with the patient and they are unchanged from previous note.  ALLERGIES:  is allergic to aspirin; oxycodone hcl; propoxyphene n-acetaminophen; tramadol; adhesive [tape]; and prednisone.  MEDICATIONS:  Current Outpatient Prescriptions  Medication Sig Dispense Refill  . acetaminophen (TYLENOL) 500 MG tablet Take 1,000 mg by mouth every 6 (six) hours as needed (pain).    Marland Kitchen acetaminophen-codeine (TYLENOL #3) 300-30 MG tablet Take 1 tablet by mouth every 6 (six) hours as needed for moderate pain. 20 tablet 0  . albuterol (PROAIR HFA) 108 (90 Base) MCG/ACT inhaler Inhale 2 puffs into the lungs every 6 (six) hours as needed for wheezing or shortness of breath.    . Calcium Citrate-Vitamin D (CALCIUM  CITRATE +D PO) Take 2 tablets by mouth daily. Calcium 600 mg     . cyclobenzaprine (FLEXERIL) 5 MG tablet Take 5 mg by mouth 3 (three) times daily as needed for muscle spasms.    Marland Kitchen gabapentin (NEURONTIN) 300 MG capsule Take 300 mg by mouth 3 (three) times daily.    Marland Kitchen  lidocaine-prilocaine (EMLA) cream Apply to affected area once 30 g 3  . lisinopril (PRINIVIL,ZESTRIL) 20 MG tablet Take 20 mg by mouth daily.    . mometasone (ELOCON) 0.1 % cream Apply 1 application topically daily as needed (rash - summer eczema). Apply to arms    . Multiple Vitamin (MULTIVITAMIN WITH MINERALS) TABS tablet Take 1 tablet by mouth at bedtime.    . Multiple Vitamins-Iron (MULTI-VITAMIN/IRON PO) Take 1 tablet by mouth daily.    Marland Kitchen omeprazole (PRILOSEC) 40 MG capsule Take 1 capsule (40 mg total) by mouth daily. 30 capsule 5  . ondansetron (ZOFRAN) 8 MG tablet Take 1 tablet (8 mg total) by mouth 2 (two) times daily as needed. Start on the third day after chemotherapy. (Patient not taking: Reported on 11/02/2016) 30 tablet 1  . oxybutynin (DITROPAN) 5 MG tablet Take 1 tablet (5 mg total) by mouth 3 (three) times daily. 90 tablet 0  . prochlorperazine (COMPAZINE) 10 MG tablet Take 1 tablet (10 mg total) by mouth every 6 (six) hours as needed (Nausea or vomiting). 30 tablet 1  . ranitidine (ZANTAC) 300 MG capsule Take 1 capsule (300 mg total) by mouth 2 (two) times daily. 60 capsule 3  . silver sulfADIAZINE (SILVADENE) 1 % cream Apply 1 application topically 2 (two) times daily as needed (stomach tears). 50 g 0  . sucralfate (CARAFATE) 1 g tablet Take 1 tablet (1 g total) by mouth 4 (four) times daily -  with meals and at bedtime. 120 tablet 0   No current facility-administered medications for this visit.    Facility-Administered Medications Ordered in Other Visits  Medication Dose Route Frequency Provider Last Rate Last Dose  . heparin lock flush 100 unit/mL  500 Units Intracatheter Once PRN Nicholas Lose, MD      . PACLitaxel (TAXOL) 132 mg in dextrose 5 % 250 mL chemo infusion (</= 33m/m2)  65 mg/m2 (Treatment Plan Recorded) Intravenous Once GNicholas Lose MD 272 mL/hr at 02/15/17 1240 132 mg at 02/15/17 1240  . sodium chloride flush (NS) 0.9 % injection 10 mL  10 mL Intracatheter PRN  GNicholas Lose MD        PHYSICAL EXAMINATION: ECOG PERFORMANCE STATUS: 1 - Symptomatic but completely ambulatory  Vitals:   02/15/17 1105  BP: (!) 141/66  Pulse: 66  Resp: 18  Temp: 98.3 F (36.8 C)  SpO2: 100%   Filed Weights   02/15/17 1105  Weight: 195 lb 4.8 oz (88.6 kg)    GENERAL:alert, no distress and comfortable SKIN: skin color, texture, turgor are normal, no rashes or significant lesions EYES: normal, Conjunctiva are pink and non-injected, sclera clear OROPHARYNX:no exudate, no erythema and lips, buccal mucosa, and tongue normal  NECK: supple, thyroid normal size, non-tender, without nodularity LYMPH:  no palpable lymphadenopathy in the cervical, axillary or inguinal LUNGS: clear to auscultation and percussion with normal breathing effort HEART: regular rate & rhythm and no murmurs and no lower extremity edema ABDOMEN:abdomen soft, non-tender and normal bowel sounds MUSCULOSKELETAL:no cyanosis of digits and no clubbing  NEURO: alert & oriented x 3 with fluent speech, mild peripheral neuropathy EXTREMITIES: No lower extremity edema  LABORATORY  DATA:  I have reviewed the data as listed   Chemistry      Component Value Date/Time   NA 138 02/15/2017 0938   K 4.4 02/15/2017 0938   CL 104 12/14/2016 0120   CO2 25 02/15/2017 0938   BUN 10.6 02/15/2017 0938   CREATININE 0.7 02/15/2017 0938      Component Value Date/Time   CALCIUM 9.6 02/15/2017 0938   ALKPHOS 70 02/15/2017 0938   AST 15 02/15/2017 0938   ALT 9 02/15/2017 0938   BILITOT 0.49 02/15/2017 0938       Lab Results  Component Value Date   WBC 2.6 (L) 02/15/2017   HGB 10.2 (L) 02/15/2017   HCT 30.3 (L) 02/15/2017   MCV 98.8 02/15/2017   PLT 205 02/15/2017   NEUTROABS 1.6 02/15/2017    ASSESSMENT & PLAN:  Malignant neoplasm of lower-outer quadrant of left breast of female, estrogen receptor positive ([place]) 07/26/2016: Left lumpectomy: IDC 1.8 cm, with DCIS, margins negative, 0/2 lymph nodes,  ER 80%, PR 20%, HER-2 negative ratio 1.18, Ki-67 60%, T1cN0 stage IA  Oncotype DX score 51: 10 year risk of recurrence greater than 34%, extremely high risk  Recommendation: 1. Adjuvant chemotherapy with Adriamycin/Cytoxan x 4, then weekly Taxol x 12 2. Adjuvant radiation therapy followed by 3. Adjuvant antiestrogen therapy -------------------------------------------------------------------------------------------------------------------------------- Current treatment: Cycle 4 Taxol Chemotherapy toxicities: 1. Alopecia  2.Fatigue: Quite severe 3. Abdominal discomfort: With abdominal cramps 4. Thrush: Resolved with Diflucan 5. Parotitis: Received oralclindamycin. Improved 6. Diffuse body aches and pains 7. Anemia due to chemotherapy  8. Neutropenia due to chemotherapy: Today's ANC is 2000.  9. Chemotherapy-induced neuropathy: I reduced the dosage of chemotherapy and we are monitoring it very closely.  I reduced the dosage of Taxol with cycle 2 because of her multiple symptoms.  Return to clinic in 2 weeks for toxicity check and evaluation and weekly for Taxol treatments   I spent 25 minutes talking to the patient of which more than half was spent in counseling and coordination of care.  No orders of the defined types were placed in this encounter.  The patient has a good understanding of the overall plan. she agrees with it. she will call with any problems that may develop before the next visit here.   Rulon Eisenmenger, MD 02/15/17

## 2017-02-15 NOTE — Patient Instructions (Signed)
[facility] Discharge Instructions for Patients Receiving Chemotherapy  Today you received the following chemotherapy agents paclitaxel (Taxol).  To help prevent nausea and vomiting after your treatment, we encourage you to take your nausea medication as prescribed.   If you develop nausea and vomiting that is not controlled by your nausea medication, call the clinic.   BELOW ARE SYMPTOMS THAT SHOULD BE REPORTED IMMEDIATELY:  *FEVER GREATER THAN 100.5 F  *CHILLS WITH OR WITHOUT FEVER  NAUSEA AND VOMITING THAT IS NOT CONTROLLED WITH YOUR NAUSEA MEDICATION  *UNUSUAL SHORTNESS OF BREATH  *UNUSUAL BRUISING OR BLEEDING  TENDERNESS IN MOUTH AND THROAT WITH OR WITHOUT PRESENCE OF ULCERS  *URINARY PROBLEMS  *BOWEL PROBLEMS  UNUSUAL RASH Items with * indicate a potential emergency and should be followed up as soon as possible.  Feel free to call the clinic you have any questions or concerns. The clinic phone number is [phone].  Please show the CHEMO ALERT CARD at check-in to the Emergency Department and triage nurse.   

## 2017-02-18 ENCOUNTER — Telehealth: Payer: Self-pay | Admitting: Hematology and Oncology

## 2017-02-18 NOTE — Telephone Encounter (Signed)
Scheduled appt per 9/28 los - patient is aware of appt date and time.

## 2017-02-22 ENCOUNTER — Other Ambulatory Visit: Payer: Self-pay

## 2017-02-22 ENCOUNTER — Other Ambulatory Visit: Payer: Self-pay | Admitting: Medical

## 2017-02-22 ENCOUNTER — Ambulatory Visit (HOSPITAL_BASED_OUTPATIENT_CLINIC_OR_DEPARTMENT_OTHER): Payer: Medicare Other

## 2017-02-22 ENCOUNTER — Ambulatory Visit (HOSPITAL_BASED_OUTPATIENT_CLINIC_OR_DEPARTMENT_OTHER): Payer: Medicare Other | Admitting: Medical

## 2017-02-22 ENCOUNTER — Other Ambulatory Visit (HOSPITAL_BASED_OUTPATIENT_CLINIC_OR_DEPARTMENT_OTHER): Payer: Medicare Other

## 2017-02-22 VITALS — BP 118/65 | HR 61 | Temp 98.4°F | Resp 18

## 2017-02-22 DIAGNOSIS — J309 Allergic rhinitis, unspecified: Secondary | ICD-10-CM

## 2017-02-22 DIAGNOSIS — C50511 Malignant neoplasm of lower-outer quadrant of right female breast: Secondary | ICD-10-CM | POA: Diagnosis not present

## 2017-02-22 DIAGNOSIS — Z170 Estrogen receptor positive status [ER+]: Secondary | ICD-10-CM

## 2017-02-22 DIAGNOSIS — R072 Precordial pain: Secondary | ICD-10-CM

## 2017-02-22 DIAGNOSIS — C50512 Malignant neoplasm of lower-outer quadrant of left female breast: Secondary | ICD-10-CM

## 2017-02-22 DIAGNOSIS — K219 Gastro-esophageal reflux disease without esophagitis: Secondary | ICD-10-CM | POA: Diagnosis not present

## 2017-02-22 DIAGNOSIS — Z5111 Encounter for antineoplastic chemotherapy: Secondary | ICD-10-CM

## 2017-02-22 LAB — CBC WITH DIFFERENTIAL/PLATELET
BASO%: 0 % (ref 0.0–2.0)
Basophils Absolute: 0 10*3/uL (ref 0.0–0.1)
EOS%: 4.5 % (ref 0.0–7.0)
Eosinophils Absolute: 0.1 10*3/uL (ref 0.0–0.5)
HCT: 29.9 % — ABNORMAL LOW (ref 34.8–46.6)
HGB: 9.9 g/dL — ABNORMAL LOW (ref 11.6–15.9)
LYMPH%: 26.3 % (ref 14.0–49.7)
MCH: 32.7 pg (ref 25.1–34.0)
MCHC: 33.1 g/dL (ref 31.5–36.0)
MCV: 98.7 fL (ref 79.5–101.0)
MONO#: 0.2 10*3/uL (ref 0.1–0.9)
MONO%: 6.6 % (ref 0.0–14.0)
NEUT#: 1.5 10*3/uL (ref 1.5–6.5)
NEUT%: 62.6 % (ref 38.4–76.8)
Platelets: 175 10*3/uL (ref 145–400)
RBC: 3.03 10*6/uL — ABNORMAL LOW (ref 3.70–5.45)
RDW: 14.4 % (ref 11.2–14.5)
WBC: 2.4 10*3/uL — ABNORMAL LOW (ref 3.9–10.3)
lymph#: 0.6 10*3/uL — ABNORMAL LOW (ref 0.9–3.3)

## 2017-02-22 LAB — COMPREHENSIVE METABOLIC PANEL
ALT: 10 U/L (ref 0–55)
AST: 14 U/L (ref 5–34)
Albumin: 4 g/dL (ref 3.5–5.0)
Alkaline Phosphatase: 59 U/L (ref 40–150)
Anion Gap: 7 mEq/L (ref 3–11)
BUN: 13.1 mg/dL (ref 7.0–26.0)
CO2: 25 mEq/L (ref 22–29)
Calcium: 9.2 mg/dL (ref 8.4–10.4)
Chloride: 106 mEq/L (ref 98–109)
Creatinine: 0.7 mg/dL (ref 0.6–1.1)
EGFR: >90 mL/min/{1.73_m2} (ref >90)
Glucose: 81 mg/dl (ref 70–140)
Potassium: 4 mEq/L (ref 3.5–5.1)
Sodium: 138 mEq/L (ref 136–145)
Total Bilirubin: 0.46 mg/dL (ref 0.20–1.20)
Total Protein: 7 g/dL (ref 6.4–8.3)

## 2017-02-22 MED ORDER — SODIUM CHLORIDE 0.9 % IV SOLN
Freq: Once | INTRAVENOUS | Status: AC
  Administered 2017-02-22: 10:00:00 via INTRAVENOUS

## 2017-02-22 MED ORDER — SODIUM CHLORIDE 0.9% FLUSH
10.0000 mL | INTRAVENOUS | Status: DC | PRN
  Administered 2017-02-22: 10 mL
  Filled 2017-02-22: qty 10

## 2017-02-22 MED ORDER — FAMOTIDINE IN NACL 20-0.9 MG/50ML-% IV SOLN
20.0000 mg | Freq: Once | INTRAVENOUS | Status: AC
  Administered 2017-02-22: 20 mg via INTRAVENOUS

## 2017-02-22 MED ORDER — DIPHENHYDRAMINE HCL 50 MG/ML IJ SOLN
25.0000 mg | Freq: Once | INTRAMUSCULAR | Status: AC
  Administered 2017-02-22: 25 mg via INTRAVENOUS

## 2017-02-22 MED ORDER — FAMOTIDINE IN NACL 20-0.9 MG/50ML-% IV SOLN
INTRAVENOUS | Status: AC
  Filled 2017-02-22: qty 50

## 2017-02-22 MED ORDER — SODIUM CHLORIDE 0.9 % IV SOLN
65.0000 mg/m2 | Freq: Once | INTRAVENOUS | Status: AC
  Administered 2017-02-22: 132 mg via INTRAVENOUS
  Filled 2017-02-22: qty 22

## 2017-02-22 MED ORDER — SODIUM CHLORIDE 0.9 % IV SOLN
20.0000 mg | Freq: Once | INTRAVENOUS | Status: AC
  Administered 2017-02-22: 20 mg via INTRAVENOUS
  Filled 2017-02-22: qty 2

## 2017-02-22 MED ORDER — HEPARIN SOD (PORK) LOCK FLUSH 100 UNIT/ML IV SOLN
500.0000 [IU] | Freq: Once | INTRAVENOUS | Status: AC | PRN
  Administered 2017-02-22: 500 [IU]
  Filled 2017-02-22: qty 5

## 2017-02-22 MED ORDER — FLUTICASONE PROPIONATE 50 MCG/ACT NA SUSP: 1.0000 | Freq: Two times a day (BID) | NASAL | 5 refills | Status: DC

## 2017-02-22 MED ORDER — DIPHENHYDRAMINE HCL 50 MG/ML IJ SOLN
INTRAMUSCULAR | Status: AC
  Filled 2017-02-22: qty 1

## 2017-02-22 MED FILL — FLUTICASONE PROP 50 MCG SPR: 50 | 30 days supply | Qty: 16 | Fill #0

## 2017-02-22 NOTE — Progress Notes (Signed)
[facility] Progress Note   [name] [id number] 1960-06-30 56 y.o.  [name] is managed by Dr. Nicholas Lose  Actively treated with chemotherapy: yes  Current Therapy: Paclitaxel  Last Treated: 10 / 05 / 2018  Assessment: Plan:    Gastroesophageal reflux disease, esophagitis presence not specified  Allergic rhinitis, unspecified seasonality, unspecified trigger  Precordial pain   GERD: The patient is instructed to [name] using her Pepcid or ranitidine regularly.  Allergic rhinitis: The patient was given a prescription for Flonase 1 spray in each nostril twice daily.  Precordial pain: An EKG was completed which returned showing only sinus bradycardia at 59 beats per minutes with no other abnormalities.  Please see After Visit Summary for patient specific instructions.  Future Appointments Date Time Provider [place]  03/01/2017 7:45 AM [facility] LAB 4 [facility] None  03/01/2017 8:15 AM Nicholas Lose, MD [facility] None  03/01/2017 8:45 AM [facility] FLUSH NURSE 2 [facility] None  03/01/2017 9:30 AM [facility] D11 [facility] None  03/08/2017 10:00 AM [facility] ONLY [facility] None  03/08/2017 10:15 AM [facility] INJ NURSE [facility] None  03/08/2017 11:00 AM [facility] H30 [facility] None  03/15/2017 12:30 PM [facility] ONLY [facility] None  03/15/2017 1:00 PM [facility] FLUSH NURSE 2 [facility] None  03/15/2017 1:30 PM Gardenia Phlegm, NP [facility] None  03/15/2017 2:30 PM [facility] I26 DNS [facility] None  03/22/2017 8:15 AM [facility] LAB 1 [facility] None  03/22/2017 8:30 AM [facility] FLUSH NURSE [facility] None  03/22/2017 9:30 AM [facility] H28 [facility] None  03/29/2017 2:15 PM [facility] LAB 1 [facility] None  03/29/2017 2:30 PM [facility] FLUSH NURSE 2 [facility] None  03/29/2017 3:30 PM [facility] I26 DNS [facility] None  04/01/2017 3:00 PM Causey, Charlestine Massed, NP  [facility] None  04/05/2017 8:45 AM [facility] LAB 2 [facility] None  04/05/2017 9:00 AM [facility] FLUSH NURSE 2 [facility] None  04/05/2017 10:00 AM [facility] B6 [facility] None  04/12/2017 8:45 AM [facility] LAB 1 [facility] None  04/12/2017 9:00 AM [facility] FLUSH NURSE [facility] None  04/12/2017 9:30 AM Causey, Charlestine Massed, NP [facility] None  04/12/2017 10:30 AM [facility] I26 DNS [facility] None    No orders of the defined types were placed in this encounter.      Subjective:   Patient ID:  [name] is a 56 y.o. (DOB 04-30-1961) female.  Chief Complaint: No chief complaint on file.   HPI [name] is a 56 year old female with a diagnosis of a malignant neoplasm of the lower outer quadrant of the right breast, ER positive. She presents to the infusion room for weekly Taxol she reports having decreased energy. She was in a two-story house reports that she becomes short winded when walking up the steps. She has a history of asthma and reports that she had been using her inhaler only a few times per year but is having to use an almost daily now. She had 3 episodes of sharp substernal chest pain this morning that lasted for a total of 10 minutes. The pain was reported as being sharp and traveling from her substernal chest 2 her back. Each episode only lasted a few seconds and occurred over a total of 10 minutes. She has a history of a DVT and was previously on anticoagulation but is currently not. She has an IVC filter in place. She denies any history of heart disease. She denied increased shortness of breath over baseline this morning with these episodes of chest discomfort. There was no radiation  to her neck or left arm and she had no diaphoresis. These episodes of pain occurred immediately upon waking. They resolved spontaneously without any other intervention. She reports having some episodic headaches. She denies any productive cough sneezing fever  chills sweats or sick contacts. She does report having GERD recently and has had to take Pepcid or ranitidine regularly. Her vital signs are stable today.  Medications: I have reviewed the patient's current medications.  Allergies:  Allergies  Allergen Reactions  . Aspirin Other (See Comments)    ESOPHAGITIS  . Oxycodone Hcl Diarrhea and Nausea And Vomiting  . Propoxyphene N-Acetaminophen Diarrhea and Nausea And Vomiting  . Tramadol Other (See Comments)    Cause migraines  . Adhesive [Tape] Rash and Other (See Comments)    Pulls skin off - please use paper tape  . Prednisone Rash    Past Medical History:  Diagnosis Date  . Anemia    since bypass  . Arthritis    osteoarthritis  . Asthma    states no asthma attack since 2002  . Breast cancer ([place]) 06/26/16 bx   left breast  . Chronic back pain   . Complication of anesthesia    states takes more than normal to put her to sleep  . Dental bridge present    upper  . Dental crowns present   . DVT of upper extremity (deep vein thrombosis) ([place])   . Fibromyalgia   . Genetic testing 09/19/2016   Ms. Wik underwent genetic counseling and testing for hereditary cancer syndromes on 08/21/2016. Her results were negative for mutations in all 46 genes analyzed by [facility]'s 46-gene Common Hereditary Cancers Panel. Genes analyzed include: APC, ATM, AXIN2, BARD1, BMPR1A, BRCA1, BRCA2, BRIP1, CDH1, CDKN2A, CHEK2, CTNNA1, DICER1, EPCAM, GREM1, HOXB13, KIT, MEN1, MLH1, MSH2, MSH3, MSH6, MUTYH, NBN,   . Headache(784.0)    migraines  . History of blood transfusion 06/2005  . History of gallstones   . History of pneumonia   . History of shingles 07/2011  . HTN (hypertension)   . Normal coronary arteries 2003  . PFO (patent foramen ovale)   . Presence of inferior vena cava filter   . Pulmonary embolism ([place])   . Sleep apnea    used CPAP until after bypass surg.  . Status post gastric bypass for obesity   . Stroke [facility]) 12/2002   right-sided  weakness  . Urinary incontinence     Past Surgical History:  Procedure Laterality Date  . ABDOMINAL HYSTERECTOMY  11/1997   complete  . ANTERIOR CERVICAL DECOMP/DISCECTOMY FUSION  02/05/2005   C5-6  . APPENDECTOMY  10/22/2008   laparoscopic  . BREAST LUMPECTOMY WITH RADIOACTIVE SEED AND SENTINEL LYMPH NODE BIOPSY Left 07/26/2016   Procedure: BREAST LUMPECTOMY WITH RADIOACTIVE SEED AND SENTINEL LYMPH NODE BIOPSY;  Surgeon: Erroll Luna, MD;  Location: [place];  Service: General;  Laterality: Left;  . BUNIONECTOMY  05/1980   both feet  . BUNIONECTOMY  08/2011   left foot  . CARDIAC CATHETERIZATION  03/04/2002  . CARPAL TUNNEL RELEASE  06/21/2009   right  . CARPAL TUNNEL RELEASE     left hand  . CARPAL TUNNEL RELEASE  10/03/2011   Procedure: CARPAL TUNNEL RELEASE;  Surgeon: Wynonia Sours, MD;  Location: [place];  Service: Orthopedics;  Laterality: Right;  CARPAL TUNNEL WITH HYPOTHENAR FAT PAD TRANSFER  . CERVICAL SPINE SURGERY  01/2005   titanium plate implanted  . CHOLECYSTECTOMY  1990  . ELBOW  SURGERY  08/09/2004   decompression ulnar nerve right elbow  . ENTEROLYSIS  10/22/2008   laparoscopic abd. enterolysis  . GASTRIC ROUX-EN-Y  2009  . HEEL SPUR SURGERY  08/1997   left  . HEMORRHOID SURGERY  03/1993  . LAPAROSCOPIC LYSIS INTESTINAL ADHESIONS  02/14/2000  . NAILBED REPAIR  01/10/2005; 08/2011   exc. matrix bilat. great toe  . OTHER SURGICAL HISTORY  12/1986   pt states that she had surgery to unclog her fallopean tubes  . PORTACATH PLACEMENT N/A 09/24/2016   Procedure: INSERTION PORT-A-CATH WITH [name];  Surgeon: Erroll Luna, MD;  Location: [place];  Service: General;  Laterality: N/A;  . SHOULDER SURGERY     bilat. - (left:  06/2005)  . TONSILLECTOMY  07/1995  . TRIGGER FINGER RELEASE  04/25/2006   decompression A-1 pulley left thumb  . UTERINE FIBROID SURGERY  12/95, 7/96   x2  . VENA CAVA FILTER PLACEMENT  2009   during Roux-en-Y surg.    Family  History  Problem Relation Age of Onset  . Breast cancer Paternal Aunt 7  . Cervical cancer Paternal Grandmother 23       d.40s  . Ovarian cancer Maternal Grandmother 23       d.23  . Colon polyps Father   . Diabetes Father        borderline  . Prostate cancer Father   . Hypertension Mother   . Hypertension Unknown   . Breast cancer Sister 70       treated with neoadjuvant chemo/radiation and lumpectomy    Social History   Social History  . Marital status: Single    Spouse name: N/A  . Number of children: 0  . Years of education: N/A   Occupational History  . disabled    Social History Main Topics  . Smoking status: Former Research scientist (life sciences)  . Smokeless tobacco: Never Used     Comment: quit smoking 08/1989  . Alcohol use No  . Drug use: No  . Sexual activity: No   Other Topics Concern  . Not on file   Social History Narrative  . No narrative on file    Past Medical History, Surgical history, Social history, and Family history were reviewed and updated as appropriate.   Please see review of systems for further details on the patient's review from today.   Review of Systems:  Review of Systems  Constitutional: Positive for activity change and fatigue. Negative for chills, diaphoresis and fever.  Respiratory: Positive for shortness of breath and wheezing. Negative for cough and chest tightness.   Cardiovascular: Positive for chest pain. Negative for palpitations and leg swelling.  Neurological: Positive for weakness and headaches.    Objective:   Physical Exam:  There were no vitals taken for this visit. ECOG: 0  Physical Exam  Constitutional: No distress.  HENT:  Head: Normocephalic.  Nose:    Mouth/Throat: Oropharynx is clear and moist. No oropharyngeal exudate.  Neck: Normal range of motion. Neck supple.  Cardiovascular: Normal rate, regular rhythm and normal heart sounds.  Exam reveals no gallop and no friction rub.   No murmur heard. Pulmonary/Chest:  Effort normal and breath sounds normal. No respiratory distress. She has no wheezes. She has no rales.  An accessed port was noted in the right chest wall. No erythema, edema, increased warmth, or tenderness noted.   Abdominal: Soft. Bowel sounds are normal. She exhibits no distension. There is no tenderness. There is no rebound and no guarding.  Lymphadenopathy:    She has no cervical adenopathy.  Neurological: She is alert.  Skin: Skin is warm and dry. She is not diaphoretic.    Lab Review:     Component Value Date/Time   NA 138 02/22/2017 0917   K 4.0 02/22/2017 0917   CL 104 12/14/2016 0120   CO2 25 02/22/2017 0917   GLUCOSE 81 02/22/2017 0917   BUN 13.1 02/22/2017 0917   CREATININE 0.7 02/22/2017 0917   CALCIUM 9.2 02/22/2017 0917   PROT 7.0 02/22/2017 0917   ALBUMIN 4.0 02/22/2017 0917   AST 14 02/22/2017 0917   ALT 10 02/22/2017 0917   ALKPHOS 59 02/22/2017 0917   BILITOT 0.46 02/22/2017 0917   GFRNONAA >60 12/14/2016 0120   GFRAA >60 12/14/2016 0120       Component Value Date/Time   WBC 2.4 (L) 02/22/2017 0917   WBC 7.5 12/14/2016 0120   RBC 3.03 (L) 02/22/2017 0917   RBC 3.13 (L) 12/14/2016 0120   HGB 9.9 (L) 02/22/2017 0917   HCT 29.9 (L) 02/22/2017 0917   PLT 175 02/22/2017 0917   MCV 98.7 02/22/2017 0917   MCH 32.7 02/22/2017 0917   MCH 31.0 12/14/2016 0120   MCHC 33.1 02/22/2017 0917   MCHC 33.9 12/14/2016 0120   RDW 14.4 02/22/2017 0917   LYMPHSABS 0.6 (L) 02/22/2017 0917   MONOABS 0.2 02/22/2017 0917   EOSABS 0.1 02/22/2017 0917   BASOSABS 0.0 02/22/2017 0917   -------------------------------  Imaging from last 24 hours (if applicable):  Radiology interpretation: No results found.      This was discussed with Dr. Nicholas Lose.

## 2017-02-22 NOTE — Progress Notes (Signed)
Patient complaining of her chest "aching" and states she had an episode of "sharp chest pain" this am that radiated to her back. She also states she has had increased SOB that started Tuesday (02/19/17). Sandi Mealy, PA notified and at chairside to assess patient. EKG taken and stable. Per Sandi Mealy, NP okay to proceed with treatment today.

## 2017-02-22 NOTE — Patient Instructions (Signed)
[facility] Discharge Instructions for Patients Receiving Chemotherapy  Today you received the following chemotherapy agents:  Taxol.  To help prevent nausea and vomiting after your treatment, we encourage you to take your nausea medication as directed.   If you develop nausea and vomiting that is not controlled by your nausea medication, call the clinic.   BELOW ARE SYMPTOMS THAT SHOULD BE REPORTED IMMEDIATELY:  *FEVER GREATER THAN 100.5 F  *CHILLS WITH OR WITHOUT FEVER  NAUSEA AND VOMITING THAT IS NOT CONTROLLED WITH YOUR NAUSEA MEDICATION  *UNUSUAL SHORTNESS OF BREATH  *UNUSUAL BRUISING OR BLEEDING  TENDERNESS IN MOUTH AND THROAT WITH OR WITHOUT PRESENCE OF ULCERS  *URINARY PROBLEMS  *BOWEL PROBLEMS  UNUSUAL RASH Items with * indicate a potential emergency and should be followed up as soon as possible.  Feel free to call the clinic should you have any questions or concerns. The clinic phone number is [phone].  Please show the CHEMO ALERT CARD at check-in to the Emergency Department and triage nurse.   

## 2017-03-01 ENCOUNTER — Ambulatory Visit (HOSPITAL_BASED_OUTPATIENT_CLINIC_OR_DEPARTMENT_OTHER): Payer: Medicare Other | Admitting: Hematology and Oncology

## 2017-03-01 ENCOUNTER — Other Ambulatory Visit (HOSPITAL_BASED_OUTPATIENT_CLINIC_OR_DEPARTMENT_OTHER): Payer: Medicare Other

## 2017-03-01 ENCOUNTER — Ambulatory Visit (HOSPITAL_BASED_OUTPATIENT_CLINIC_OR_DEPARTMENT_OTHER): Payer: Medicare Other

## 2017-03-01 ENCOUNTER — Ambulatory Visit: Payer: Medicare Other

## 2017-03-01 ENCOUNTER — Other Ambulatory Visit: Payer: Self-pay | Admitting: Hematology and Oncology

## 2017-03-01 DIAGNOSIS — Z5111 Encounter for antineoplastic chemotherapy: Secondary | ICD-10-CM

## 2017-03-01 DIAGNOSIS — G620 Drug-induced polyneuropathy: Secondary | ICD-10-CM

## 2017-03-01 DIAGNOSIS — Z23 Encounter for immunization: Secondary | ICD-10-CM | POA: Diagnosis not present

## 2017-03-01 DIAGNOSIS — C50512 Malignant neoplasm of lower-outer quadrant of left female breast: Secondary | ICD-10-CM

## 2017-03-01 DIAGNOSIS — D6481 Anemia due to antineoplastic chemotherapy: Secondary | ICD-10-CM | POA: Diagnosis not present

## 2017-03-01 DIAGNOSIS — Z170 Estrogen receptor positive status [ER+]: Secondary | ICD-10-CM

## 2017-03-01 DIAGNOSIS — D701 Agranulocytosis secondary to cancer chemotherapy: Secondary | ICD-10-CM | POA: Diagnosis not present

## 2017-03-01 DIAGNOSIS — Z95828 Presence of other vascular implants and grafts: Secondary | ICD-10-CM

## 2017-03-01 LAB — CBC WITH DIFFERENTIAL/PLATELET
BASO%: 1.4 % (ref 0.0–2.0)
Basophils Absolute: 0 10*3/uL (ref 0.0–0.1)
EOS%: 3.7 % (ref 0.0–7.0)
Eosinophils Absolute: 0.1 10*3/uL (ref 0.0–0.5)
HCT: 28.8 % — ABNORMAL LOW (ref 34.8–46.6)
HGB: 9.6 g/dL — ABNORMAL LOW (ref 11.6–15.9)
LYMPH%: 25.1 % (ref 14.0–49.7)
MCH: 33.2 pg (ref 25.1–34.0)
MCHC: 33.5 g/dL (ref 31.5–36.0)
MCV: 99.2 fL (ref 79.5–101.0)
MONO#: 0.2 10*3/uL (ref 0.1–0.9)
MONO%: 8.9 % (ref 0.0–14.0)
NEUT#: 1.3 10*3/uL — ABNORMAL LOW (ref 1.5–6.5)
NEUT%: 60.9 % (ref 38.4–76.8)
Platelets: 208 10*3/uL (ref 145–400)
RBC: 2.9 10*6/uL — ABNORMAL LOW (ref 3.70–5.45)
RDW: 14.7 % — ABNORMAL HIGH (ref 11.2–14.5)
WBC: 2.2 10*3/uL — ABNORMAL LOW (ref 3.9–10.3)
lymph#: 0.5 10*3/uL — ABNORMAL LOW (ref 0.9–3.3)

## 2017-03-01 LAB — COMPREHENSIVE METABOLIC PANEL
ALT: 12 U/L (ref 0–55)
AST: 16 U/L (ref 5–34)
Albumin: 3.7 g/dL (ref 3.5–5.0)
Alkaline Phosphatase: 55 U/L (ref 40–150)
Anion Gap: 8 mEq/L (ref 3–11)
BUN: 12 mg/dL (ref 7.0–26.0)
CO2: 24 mEq/L (ref 22–29)
Calcium: 8.9 mg/dL (ref 8.4–10.4)
Chloride: 107 mEq/L (ref 98–109)
Creatinine: 0.6 mg/dL (ref 0.6–1.1)
EGFR: >60 mL/min/{1.73_m2} (ref >60)
Glucose: 93 mg/dl (ref 70–140)
Potassium: 3.9 mEq/L (ref 3.5–5.1)
Sodium: 138 mEq/L (ref 136–145)
Total Bilirubin: 0.38 mg/dL (ref 0.20–1.20)
Total Protein: 6.4 g/dL (ref 6.4–8.3)

## 2017-03-01 MED ORDER — FAMOTIDINE IN NACL 20-0.9 MG/50ML-% IV SOLN
INTRAVENOUS | Status: AC
  Filled 2017-03-01: qty 50

## 2017-03-01 MED ORDER — SODIUM CHLORIDE 0.9 % IV SOLN
20.0000 mg | Freq: Once | INTRAVENOUS | Status: AC
  Administered 2017-03-01: 20 mg via INTRAVENOUS
  Filled 2017-03-01: qty 2

## 2017-03-01 MED ORDER — DIPHENHYDRAMINE HCL 50 MG/ML IJ SOLN
25.0000 mg | Freq: Once | INTRAMUSCULAR | Status: AC
  Administered 2017-03-01: 25 mg via INTRAVENOUS

## 2017-03-01 MED ORDER — INFLUENZA VAC SPLIT QUAD 0.5 ML IM SUSY
0.5000 mL | PREFILLED_SYRINGE | Freq: Once | INTRAMUSCULAR | Status: AC
  Administered 2017-03-01: 0.5 mL via INTRAMUSCULAR
  Filled 2017-03-01: qty 0.5

## 2017-03-01 MED ORDER — SODIUM CHLORIDE 0.9% FLUSH
10.0000 mL | Freq: Once | INTRAVENOUS | Status: AC
  Administered 2017-03-01: 10 mL
  Filled 2017-03-01: qty 10

## 2017-03-01 MED ORDER — DIPHENHYDRAMINE HCL 50 MG/ML IJ SOLN
INTRAMUSCULAR | Status: AC
Start: 2017-03-01 — End: 2017-03-01
  Filled 2017-03-01: qty 1

## 2017-03-01 MED ORDER — FAMOTIDINE IN NACL 20-0.9 MG/50ML-% IV SOLN
20.0000 mg | Freq: Once | INTRAVENOUS | Status: AC
  Administered 2017-03-01: 20 mg via INTRAVENOUS

## 2017-03-01 MED ORDER — SODIUM CHLORIDE 0.9% FLUSH
10.0000 mL | INTRAVENOUS | Status: DC | PRN
  Administered 2017-03-01: 10 mL
  Filled 2017-03-01: qty 10

## 2017-03-01 MED ORDER — SODIUM CHLORIDE 0.9 % IV SOLN
Freq: Once | INTRAVENOUS | Status: AC
  Administered 2017-03-01: 11:00:00 via INTRAVENOUS

## 2017-03-01 MED ORDER — SODIUM CHLORIDE 0.9 % IV SOLN
65.0000 mg/m2 | Freq: Once | INTRAVENOUS | Status: AC
  Administered 2017-03-01: 132 mg via INTRAVENOUS
  Filled 2017-03-01: qty 22

## 2017-03-01 MED ORDER — HEPARIN SOD (PORK) LOCK FLUSH 100 UNIT/ML IV SOLN
500.0000 [IU] | Freq: Once | INTRAVENOUS | Status: AC | PRN
  Administered 2017-03-01: 500 [IU]
  Filled 2017-03-01: qty 5

## 2017-03-01 NOTE — Progress Notes (Signed)
Per Dr. Lindi Adie, ok to treat today with [place] of 1.3.

## 2017-03-01 NOTE — Assessment & Plan Note (Signed)
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 Adjuvant chemotherapy with Adriamycin/Cytoxan x 4, then weekly Taxol x 12 2. Adjuvant radiation therapy followed by 3. Adjuvant antiestrogen therapy -------------------------------------------------------------------------------------------------------------------------------- Current treatment: Cycle 6Taxol Chemotherapy toxicities: 1. Alopecia  2.Fatigue: Quite severe 3. Abdominal discomfort: With abdominal cramps 4. Thrush: Resolved with Diflucan 5. Parotitis: Received oralclindamycin. Improved 6. Diffuse body aches and pains 7. Anemia due to chemotherapy  8. Neutropenia due to chemotherapy: Today's ANC is 2000.  9. Chemotherapy-induced neuropathy: I reduced the dosage of chemotherapy and we are monitoring it very closely.  I reduced the dosage of Taxol with cycle 2 because of her multiple symptoms.  Return to clinic in 2 weeks for toxicity check and evaluation and weekly for Taxol treatments

## 2017-03-01 NOTE — Progress Notes (Signed)
Patient Care Team: [name], MD as PCP - General (Family Medicine) Nicholas Lose, MD as Consulting Physician (Hematology and Oncology)  DIAGNOSIS:  Encounter Diagnosis  Name Primary?  . Malignant neoplasm of lower-outer quadrant of left breast of female, estrogen receptor positive ([place])     SUMMARY OF ONCOLOGIC HISTORY:   Malignant neoplasm of lower-outer quadrant of left breast of female, estrogen receptor positive ([place])   06/26/2016 Initial Diagnosis    Left breast biopsy 3:00: IDC with DCIS, grade 3, ER 80%, PR 20%, Ki-67 60%, HER-2 negative ratio 1.18; palpable lump: 1.8 cm lesion, no lymph nodes, T1 cN0 stage I a clinical stage      07/26/2016 Surgery    Left lumpectomy: IDC 1.8 cm, with DCIS, margins negative, 0/2 lymph nodes, ER 80%, PR 20%, HER-2 negative ratio 1.18, Ki-67 60%, T1cN0 stage IA       08/21/2016 Oncotype testing    Oncotype DX recurrence score 51, risk of recurrence 34%      08/21/2016 Genetic Testing    Genetic counseling and testing for hereditary cancer syndromes performed on 08/21/2016. Results are negative for pathogenic mutations in 46 genes analyzed by [facility] Common Hereditary Cancers Panel. Results are dated 09/14/2016. Genes tested: APC, ATM, AXIN2, BARD1, BMPR1A, BRCA1, BRCA2, BRIP1, CDH1, CDKN2A, CHEK2, CTNNA1, DICER1, EPCAM, GREM1, HOXB13, KIT, MEN1, MLH1, MSH2, MSH3, MSH6, MUTYH, NBN, NF1, NTHL1, PALB2, PDGFRA, PMS2, POLD1, POLE, PTEN, RAD50, RAD51C, RAD51D, SDHA, SDHB, SDHC, SDHD, SMAD4, SMARCA4, STK11, TP53, TSC1, TSC2, and VHL.        11/02/2016 -  Chemotherapy    Dose dense Adriamycin and Cytoxan 4 followed by Taxol weekly 12       CHIEF COMPLIANT: Cycle 6 of Taxol  INTERVAL HISTORY: [name] is a 56 year old with above-mentioned history left breast cancer treated with lumpectomy and is currently on adjuvant chemotherapy and today is cycle 6 of Taxol. Overall she is tolerated chemotherapy fairly well. She does have fatigue  related to chemotherapy but also discomfort related to Taxol induced neuropathy. She continues to have some achiness and tingling and numbness in the fingers and toes. This has not increased since the last visit.  REVIEW OF SYSTEMS:   Constitutional: Denies fevers, chills or abnormal weight loss Eyes: Denies blurriness of vision Ears, nose, mouth, throat, and face: Denies mucositis or sore throat Respiratory: Denies cough, dyspnea or wheezes Cardiovascular: Denies palpitation, chest discomfort Gastrointestinal:  Denies nausea, heartburn or change in bowel habits Skin: Denies abnormal skin rashes Lymphatics: Denies new lymphadenopathy or easy bruising Neurological: Peripheral neuropathy Behavioral/Psych: Mood is stable, no new changes  Extremities: No lower extremity edema All other systems were reviewed with the patient and are negative.  I have reviewed the past medical history, past surgical history, social history and family history with the patient and they are unchanged from previous note.  ALLERGIES:  is allergic to aspirin; oxycodone hcl; propoxyphene n-acetaminophen; tramadol; adhesive [tape]; and prednisone.  MEDICATIONS:  Current Outpatient Prescriptions  Medication Sig Dispense Refill  . acetaminophen (TYLENOL) 500 MG tablet Take 1,000 mg by mouth every 6 (six) hours as needed (pain).    Marland Kitchen acetaminophen-codeine (TYLENOL #3) 300-30 MG tablet Take 1 tablet by mouth every 6 (six) hours as needed for moderate pain. 20 tablet 0  . albuterol (PROAIR HFA) 108 (90 Base) MCG/ACT inhaler Inhale 2 puffs into the lungs every 6 (six) hours as needed for wheezing or shortness of breath.    . Calcium Citrate-Vitamin D (CALCIUM CITRATE +D  PO) Take 2 tablets by mouth daily. Calcium 600 mg     . cyclobenzaprine (FLEXERIL) 5 MG tablet Take 5 mg by mouth 3 (three) times daily as needed for muscle spasms.    . fluticasone (FLONASE) 50 MCG/ACT nasal spray Place 1 spray into both nostrils 2 (two)  times daily. 16 g 5  . gabapentin (NEURONTIN) 300 MG capsule Take 300 mg by mouth 3 (three) times daily.    Marland Kitchen lidocaine-prilocaine (EMLA) cream Apply to affected area once 30 g 3  . lisinopril (PRINIVIL,ZESTRIL) 20 MG tablet Take 20 mg by mouth daily.    . mometasone (ELOCON) 0.1 % cream Apply 1 application topically daily as needed (rash - summer eczema). Apply to arms    . Multiple Vitamin (MULTIVITAMIN WITH MINERALS) TABS tablet Take 1 tablet by mouth at bedtime.    . Multiple Vitamins-Iron (MULTI-VITAMIN/IRON PO) Take 1 tablet by mouth daily.    Marland Kitchen omeprazole (PRILOSEC) 40 MG capsule Take 1 capsule (40 mg total) by mouth daily. 30 capsule 5  . ondansetron (ZOFRAN) 8 MG tablet Take 1 tablet (8 mg total) by mouth 2 (two) times daily as needed. Start on the third day after chemotherapy. (Patient not taking: Reported on 11/02/2016) 30 tablet 1  . oxybutynin (DITROPAN) 5 MG tablet Take 1 tablet (5 mg total) by mouth 3 (three) times daily. 90 tablet 0  . prochlorperazine (COMPAZINE) 10 MG tablet Take 1 tablet (10 mg total) by mouth every 6 (six) hours as needed (Nausea or vomiting). 30 tablet 1  . ranitidine (ZANTAC) 300 MG capsule Take 1 capsule (300 mg total) by mouth 2 (two) times daily. 60 capsule 3  . silver sulfADIAZINE (SILVADENE) 1 % cream Apply 1 application topically 2 (two) times daily as needed (stomach tears). 50 g 0  . sucralfate (CARAFATE) 1 g tablet Take 1 tablet (1 g total) by mouth 4 (four) times daily -  with meals and at bedtime. 120 tablet 0   No current facility-administered medications for this visit.    Facility-Administered Medications Ordered in Other Visits  Medication Dose Route Frequency Provider Last Rate Last Dose  . sodium chloride flush (NS) 0.9 % injection 10 mL  10 mL Intracatheter Once Nicholas Lose, MD        PHYSICAL EXAMINATION: ECOG PERFORMANCE STATUS: 1 - Symptomatic but completely ambulatory  Vitals:   03/01/17 0835  BP: 128/63  Pulse: 70  Resp: 20    Temp: 98.4 F (36.9 C)  SpO2: 99%   Filed Weights   03/01/17 0835  Weight: 194 lb 14.4 oz (88.4 kg)    GENERAL:alert, no distress and comfortable SKIN: skin color, texture, turgor are normal, no rashes or significant lesions EYES: normal, Conjunctiva are pink and non-injected, sclera clear OROPHARYNX:no exudate, no erythema and lips, buccal mucosa, and tongue normal  NECK: supple, thyroid normal size, non-tender, without nodularity LYMPH:  no palpable lymphadenopathy in the cervical, axillary or inguinal LUNGS: clear to auscultation and percussion with normal breathing effort HEART: regular rate & rhythm and no murmurs and no lower extremity edema ABDOMEN:abdomen soft, non-tender and normal bowel sounds MUSCULOSKELETAL:no cyanosis of digits and no clubbing  NEURO: alert & oriented x 3 with fluent speech, peripheral neuropathy EXTREMITIES: No lower extremity edema  LABORATORY DATA:  I have reviewed the data as listed   Chemistry      Component Value Date/Time   NA 138 02/22/2017 0917   K 4.0 02/22/2017 0917   CL 104 12/14/2016 0120  CO2 25 02/22/2017 0917   BUN 13.1 02/22/2017 0917   CREATININE 0.7 02/22/2017 0917      Component Value Date/Time   CALCIUM 9.2 02/22/2017 0917   ALKPHOS 59 02/22/2017 0917   AST 14 02/22/2017 0917   ALT 10 02/22/2017 0917   BILITOT 0.46 02/22/2017 0917       Lab Results  Component Value Date   WBC 2.4 (L) 02/22/2017   HGB 9.9 (L) 02/22/2017   HCT 29.9 (L) 02/22/2017   MCV 98.7 02/22/2017   PLT 175 02/22/2017   NEUTROABS 1.5 02/22/2017    ASSESSMENT & PLAN:  Malignant neoplasm of lower-outer quadrant of left 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  Adjuvant chemotherapy with Adriamycin/Cytoxan x 4, then weekly Taxol x 12 2. Adjuvant radiation therapy followed by 3. Adjuvant antiestrogen therapy -------------------------------------------------------------------------------------------------------------------------------- Current treatment: Cycle 6Taxol Chemotherapy toxicities: 1. Alopecia  2.Fatigue: Quite severe 3. Abdominal discomfort: With abdominal cramps and diarrhea 4. Thrush: Resolved with Diflucan 5. Parotitis: Received oralclindamycin. Improved 6. Diffuse body aches and pains 7. Anemia due to chemotherapy  8. Neutropenia due to chemotherapy: Today's ANC is 2000.  9. Chemotherapy-induced neuropathy: I reduced the dosage of chemotherapy and we are monitoring it very closely.  I reduced the dosage of Taxol with cycle 2 because of her multiple symptoms.  Return to clinic in 2 weeks for toxicity check and evaluation and weekly for Taxol treatments   I spent 25 minutes talking to the patient of which more than half was spent in counseling and coordination of care.  No orders of the defined types were placed in this encounter.  The patient has a good understanding of the overall plan. she agrees with it. she will call with any problems that may develop before the next visit here.   Rulon Eisenmenger, MD 03/01/17

## 2017-03-01 NOTE — Patient Instructions (Signed)
[facility] Discharge Instructions for Patients Receiving Chemotherapy  Today you received the following chemotherapy agents:  Taxol.  To help prevent nausea and vomiting after your treatment, we encourage you to take your nausea medication as directed.   If you develop nausea and vomiting that is not controlled by your nausea medication, call the clinic.   BELOW ARE SYMPTOMS THAT SHOULD BE REPORTED IMMEDIATELY:  *FEVER GREATER THAN 100.5 F  *CHILLS WITH OR WITHOUT FEVER  NAUSEA AND VOMITING THAT IS NOT CONTROLLED WITH YOUR NAUSEA MEDICATION  *UNUSUAL SHORTNESS OF BREATH  *UNUSUAL BRUISING OR BLEEDING  TENDERNESS IN MOUTH AND THROAT WITH OR WITHOUT PRESENCE OF ULCERS  *URINARY PROBLEMS  *BOWEL PROBLEMS  UNUSUAL RASH Items with * indicate a potential emergency and should be followed up as soon as possible.  Feel free to call the clinic should you have any questions or concerns. The clinic phone number is [phone].  Please show the CHEMO ALERT CARD at check-in to the Emergency Department and triage nurse.   

## 2017-03-04 ENCOUNTER — Telehealth: Payer: Self-pay

## 2017-03-04 NOTE — Telephone Encounter (Signed)
No los per 12/10 

## 2017-03-08 ENCOUNTER — Telehealth: Payer: Self-pay

## 2017-03-08 ENCOUNTER — Telehealth: Payer: Self-pay | Admitting: Hematology and Oncology

## 2017-03-08 ENCOUNTER — Other Ambulatory Visit: Payer: Self-pay | Admitting: Hematology and Oncology

## 2017-03-08 ENCOUNTER — Ambulatory Visit (HOSPITAL_BASED_OUTPATIENT_CLINIC_OR_DEPARTMENT_OTHER): Payer: Medicare Other

## 2017-03-08 ENCOUNTER — Other Ambulatory Visit (HOSPITAL_BASED_OUTPATIENT_CLINIC_OR_DEPARTMENT_OTHER): Payer: Medicare Other

## 2017-03-08 ENCOUNTER — Other Ambulatory Visit: Payer: Self-pay

## 2017-03-08 ENCOUNTER — Ambulatory Visit: Payer: Medicare Other

## 2017-03-08 VITALS — BP 107/66 | HR 66 | Temp 98.5°F | Resp 18

## 2017-03-08 DIAGNOSIS — C50512 Malignant neoplasm of lower-outer quadrant of left female breast: Secondary | ICD-10-CM

## 2017-03-08 DIAGNOSIS — Z170 Estrogen receptor positive status [ER+]: Secondary | ICD-10-CM

## 2017-03-08 DIAGNOSIS — Z95828 Presence of other vascular implants and grafts: Secondary | ICD-10-CM

## 2017-03-08 DIAGNOSIS — D701 Agranulocytosis secondary to cancer chemotherapy: Secondary | ICD-10-CM

## 2017-03-08 LAB — COMPREHENSIVE METABOLIC PANEL
ALT: 14 U/L (ref 0–55)
AST: 18 U/L (ref 5–34)
Albumin: 3.8 g/dL (ref 3.5–5.0)
Alkaline Phosphatase: 53 U/L (ref 40–150)
Anion Gap: 9 mEq/L (ref 3–11)
BUN: 10.4 mg/dL (ref 7.0–26.0)
CO2: 24 mEq/L (ref 22–29)
Calcium: 8.9 mg/dL (ref 8.4–10.4)
Chloride: 105 mEq/L (ref 98–109)
Creatinine: 0.6 mg/dL (ref 0.6–1.1)
EGFR: >60 mL/min/{1.73_m2} (ref >60)
Glucose: 79 mg/dl (ref 70–140)
Potassium: 3.9 mEq/L (ref 3.5–5.1)
Sodium: 138 mEq/L (ref 136–145)
Total Bilirubin: 0.35 mg/dL (ref 0.20–1.20)
Total Protein: 6.7 g/dL (ref 6.4–8.3)

## 2017-03-08 LAB — CBC WITH DIFFERENTIAL/PLATELET
BASO%: 1.4 % (ref 0.0–2.0)
Basophils Absolute: 0 10*3/uL (ref 0.0–0.1)
EOS%: 4.8 % (ref 0.0–7.0)
Eosinophils Absolute: 0.1 10*3/uL (ref 0.0–0.5)
HCT: 29.9 % — ABNORMAL LOW (ref 34.8–46.6)
HGB: 10.1 g/dL — ABNORMAL LOW (ref 11.6–15.9)
LYMPH%: 36.5 % (ref 14.0–49.7)
MCH: 33.3 pg (ref 25.1–34.0)
MCHC: 33.8 g/dL (ref 31.5–36.0)
MCV: 98.6 fL (ref 79.5–101.0)
MONO#: 0.2 10*3/uL (ref 0.1–0.9)
MONO%: 11.5 % (ref 0.0–14.0)
NEUT#: 0.9 10*3/uL — ABNORMAL LOW (ref 1.5–6.5)
NEUT%: 45.8 % (ref 38.4–76.8)
Platelets: 221 10*3/uL (ref 145–400)
RBC: 3.03 10*6/uL — ABNORMAL LOW (ref 3.70–5.45)
RDW: 14.8 % — ABNORMAL HIGH (ref 11.2–14.5)
WBC: 1.9 10*3/uL — ABNORMAL LOW (ref 3.9–10.3)
lymph#: 0.7 10*3/uL — ABNORMAL LOW (ref 0.9–3.3)

## 2017-03-08 MED ORDER — SODIUM CHLORIDE 0.9% FLUSH
10.0000 mL | Freq: Once | INTRAVENOUS | Status: AC
  Administered 2017-03-08: 10 mL
  Filled 2017-03-08: qty 10

## 2017-03-08 MED ORDER — SODIUM CHLORIDE 0.9 % IV SOLN
Freq: Once | INTRAVENOUS | Status: AC
  Administered 2017-03-08: 12:00:00 via INTRAVENOUS

## 2017-03-08 MED ORDER — HEPARIN SOD (PORK) LOCK FLUSH 100 UNIT/ML IV SOLN
500.0000 [IU] | Freq: Once | INTRAVENOUS | Status: AC
  Administered 2017-03-08: 500 [IU]
  Filled 2017-03-08: qty 5

## 2017-03-08 MED ORDER — SODIUM CHLORIDE 0.9% FLUSH
10.0000 mL | Freq: Once | INTRAVENOUS | Status: AC
Start: 2017-03-08 — End: 2017-03-08
  Administered 2017-03-08: 10 mL
  Filled 2017-03-08: qty 10

## 2017-03-08 NOTE — Patient Instructions (Signed)
Dehydration, Adult Dehydration is a condition in which there is not enough fluid or water in the body. This happens when you lose more fluids than you take in. Important organs, such as the kidneys, brain, and heart, cannot function without a proper amount of fluids. Any loss of fluids from the body can lead to dehydration. Dehydration can range from mild to severe. This condition should be treated right away to prevent it from becoming severe. What are the causes? This condition may be caused by:  Vomiting.  Diarrhea.  Excessive sweating, such as from heat exposure or exercise.  Not drinking enough fluid, especially: ? When ill. ? While doing activity that requires a lot of energy.  Excessive urination.  Fever.  Infection.  Certain medicines, such as medicines that cause the body to lose excess fluid (diuretics).  Inability to access safe drinking water.  Reduced physical ability to get adequate water and food.  What increases the risk? This condition is more likely to develop in people:  Who have a poorly controlled long-term (chronic) illness, such as diabetes, heart disease, or kidney disease.  Who are age 65 or older.  Who are disabled.  Who live in a place with high altitude.  Who play endurance sports.  What are the signs or symptoms? Symptoms of mild dehydration may include:  Thirst.  Dry lips.  Slightly dry mouth.  Dry, warm skin.  Dizziness. Symptoms of moderate dehydration may include:  Very dry mouth.  Muscle cramps.  Dark urine. Urine may be the color of tea.  Decreased urine production.  Decreased tear production.  Heartbeat that is irregular or faster than normal (palpitations).  Headache.  Light-headedness, especially when you stand up from a sitting position.  Fainting (syncope). Symptoms of severe dehydration may include:  Changes in skin, such as: ? Cold and clammy skin. ? Blotchy (mottled) or pale skin. ? Skin that does  not quickly return to normal after being lightly pinched and released (poor skin turgor).  Changes in body fluids, such as: ? Extreme thirst. ? No tear production. ? Inability to sweat when body temperature is high, such as in hot weather. ? Very little urine production.  Changes in vital signs, such as: ? Weak pulse. ? Pulse that is more than 100 beats a minute when sitting still. ? Rapid breathing. ? Low blood pressure.  Other changes, such as: ? Sunken eyes. ? Cold hands and feet. ? Confusion. ? Lack of energy (lethargy). ? Difficulty waking up from sleep. ? Short-term weight loss. ? Unconsciousness. How is this diagnosed? This condition is diagnosed based on your symptoms and a physical exam. Blood and urine tests may be done to help confirm the diagnosis. How is this treated? Treatment for this condition depends on the severity. Mild or moderate dehydration can often be treated at home. Treatment should be started right away. Do not wait until dehydration becomes severe. Severe dehydration is an emergency and it needs to be treated in a hospital. Treatment for mild dehydration may include:  Drinking more fluids.  Replacing salts and minerals in your blood (electrolytes) that you may have lost. Treatment for moderate dehydration may include:  Drinking an oral rehydration solution (ORS). This is a drink that helps you replace fluids and electrolytes (rehydrate). It can be found at pharmacies and retail stores. Treatment for severe dehydration may include:  Receiving fluids through an IV tube.  Receiving an electrolyte solution through a feeding tube that is passed through your nose   and into your stomach (nasogastric tube, or NG tube).  Correcting any abnormalities in electrolytes.  Treating the underlying cause of dehydration. Follow these instructions at home:  If directed by your health care provider, drink an ORS: ? Make an ORS by following instructions on the  package. ? Start by drinking small amounts, about  cup (120 mL) every 5-10 minutes. ? Slowly increase how much you drink until you have taken the amount recommended by your health care provider.  Drink enough clear fluid to keep your urine clear or pale yellow. If you were told to drink an ORS, finish the ORS first, then start slowly drinking other clear fluids. Drink fluids such as: ? Water. Do not drink only water. Doing that can lead to having too little salt (sodium) in the body (hyponatremia). ? Ice chips. ? Fruit juice that you have added water to (diluted fruit juice). ? Low-calorie sports drinks.  Avoid: ? Alcohol. ? Drinks that contain a lot of sugar. These include high-calorie sports drinks, fruit juice that is not diluted, and soda. ? Caffeine. ? Foods that are greasy or contain a lot of fat or sugar.  Take over-the-counter and prescription medicines only as told by your health care provider.  Do not take sodium tablets. This can lead to having too much sodium in the body (hypernatremia).  Eat foods that contain a healthy balance of electrolytes, such as bananas, oranges, potatoes, tomatoes, and spinach.  Keep all follow-up visits as told by your health care provider. This is important. Contact a health care provider if:  You have abdominal pain that: ? Gets worse. ? Stays in one area (localizes).  You have a rash.  You have a stiff neck.  You are more irritable than usual.  You are sleepier or more difficult to wake up than usual.  You feel weak or dizzy.  You feel very thirsty.  You have urinated only a small amount of very dark urine over 6-8 hours. Get help right away if:  You have symptoms of severe dehydration.  You cannot drink fluids without vomiting.  Your symptoms get worse with treatment.  You have a fever.  You have a severe headache.  You have vomiting or diarrhea that: ? Gets worse. ? Does not go away.  You have blood or green matter  (bile) in your vomit.  You have blood in your stool. This may cause stool to look black and tarry.  You have not urinated in 6-8 hours.  You faint.  Your heart rate while sitting still is over 100 beats a minute.  You have trouble breathing. This information is not intended to replace advice given to you by your health care provider. Make sure you discuss any questions you have with your health care provider. Document Released: 05/07/2005 Document Revised: 12/02/2015 Document Reviewed: 07/01/2015 Elsevier Interactive Patient Education  2018 [employer]  

## 2017-03-08 NOTE — Telephone Encounter (Signed)
Patient could not receive cycle 7 of Taxol today. Her ANC is 0.9. Patient is also getting worsening fatigue. I discussed with her couple of options which would be to reduce the dosage further to Taxol 45 mg/m. The other option would be to discontinue therapy. Patient is keen on continuation of therapy and next week when she comes to clinic she will receive a lower dosage of Taxol. If she cannot tolerate the lower dosage than we would discontinue chemotherapy.

## 2017-03-08 NOTE — Patient Instructions (Signed)
Implanted Port Home Guide An implanted port is a type of central line that is placed under the skin. Central lines are used to provide IV access when treatment or nutrition needs to be given through a person's veins. Implanted ports are used for long-term IV access. An implanted port may be placed because:  You need IV medicine that would be irritating to the small veins in your hands or arms.  You need long-term IV medicines, such as antibiotics.  You need IV nutrition for a long period.  You need frequent blood draws for lab tests.  You need dialysis.  Implanted ports are usually placed in the chest area, but they can also be placed in the upper arm, the abdomen, or the leg. An implanted port has two main parts:  Reservoir. The reservoir is round and will appear as a small, raised area under your skin. The reservoir is the part where a needle is inserted to give medicines or draw blood.  Catheter. The catheter is a thin, flexible tube that extends from the reservoir. The catheter is placed into a large vein. Medicine that is inserted into the reservoir goes into the catheter and then into the vein.  How will I care for my incision site? Do not get the incision site wet. Bathe or shower as directed by your health care provider. How is my port accessed? Special steps must be taken to access the port:  Before the port is accessed, a numbing cream can be placed on the skin. This helps numb the skin over the port site.  Your health care provider uses a sterile technique to access the port. ? Your health care provider must put on a mask and sterile gloves. ? The skin over your port is cleaned carefully with an antiseptic and allowed to dry. ? The port is gently pinched between sterile gloves, and a needle is inserted into the port.  Only "non-coring" port needles should be used to access the port. Once the port is accessed, a blood return should be checked. This helps ensure that the port  is in the vein and is not clogged.  If your port needs to remain accessed for a constant infusion, a clear (transparent) bandage will be placed over the needle site. The bandage and needle will need to be changed every week, or as directed by your health care provider.  Keep the bandage covering the needle clean and dry. Do not get it wet. Follow your health care provider's instructions on how to take a shower or bath while the port is accessed.  If your port does not need to stay accessed, no bandage is needed over the port.  What is flushing? Flushing helps keep the port from getting clogged. Follow your health care provider's instructions on how and when to flush the port. Ports are usually flushed with saline solution or a medicine called heparin. The need for flushing will depend on how the port is used.  If the port is used for intermittent medicines or blood draws, the port will need to be flushed: ? After medicines have been given. ? After blood has been drawn. ? As part of routine maintenance.  If a constant infusion is running, the port may not need to be flushed.  How long will my port stay implanted? The port can stay in for as long as your health care provider thinks it is needed. When it is time for the port to come out, surgery will be   done to remove it. The procedure is similar to the one performed when the port was put in. When should I seek immediate medical care? When you have an implanted port, you should seek immediate medical care if:  You notice a bad smell coming from the incision site.  You have swelling, redness, or drainage at the incision site.  You have more swelling or pain at the port site or the surrounding area.  You have a fever that is not controlled with medicine.  This information is not intended to replace advice given to you by your health care provider. Make sure you discuss any questions you have with your health care provider. Document  Released: 05/07/2005 Document Revised: 10/13/2015 Document Reviewed: 01/12/2013 Elsevier Interactive Patient Education  2017 [employer]  

## 2017-03-08 NOTE — Progress Notes (Signed)
Patient reports increased fatigue, headaches and some nose bleeding. Her ANC is 0.9 today. Dr. Lindi Adie notified and asks that we hold treatment today and give her a fluid bolus.

## 2017-03-08 NOTE — Telephone Encounter (Signed)
Per Dr. Lindi Adie patient will not receive Taxol today due to [place] 0.9. Patient will receive 560mL NS. Orders are in. Infusion RN made aware.  Cyndia Bent RN

## 2017-03-14 NOTE — Assessment & Plan Note (Addendum)
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 Adjuvant chemotherapy with Adriamycin/Cytoxan x 4, then weekly Taxol x 12 2. Adjuvant radiation therapy followed by 3. Adjuvant antiestrogen therapy -------------------------------------------------------------------------------------------------------------------------------- Current treatment: Cycle 7Taxol Chemotherapy toxicities: 1. Alopecia  2.Fatigue: Quite severe 3. Abdominal discomfort: With abdominal cramps 4. Thrush: Resolved with Diflucan 5. Parotitis: Received oralclindamycin. Improved 6. Diffuse body aches and pains 7. Anemia due to chemotherapy  8. Neutropenia due to chemotherapy: resolved after cycle 7 delay 9. Chemotherapy-induced neuropathy: dosage has been reduced  [name] is doing well today.  She will proceed with chemotherapy today.  I reviewed her labs today and they are stable thus far (CMET pending).    Return to clinic for labs/taxol weekly, with provider visits every other week.

## 2017-03-14 NOTE — Progress Notes (Signed)
[facility] Follow up:    Alicia Frees, MD [address] [place] [address]   DIAGNOSIS: Cancer Staging Malignant neoplasm of lower-outer quadrant of left breast of female, estrogen receptor positive ([place]) Staging form: Breast, AJCC 8th Edition - Clinical: Stage IA (cT1c, cN0(sn), cM0, G3, ER: Positive, PR: Positive, HER2: Negative, Oncotype DX score: 51) - Unsigned   SUMMARY OF ONCOLOGIC HISTORY:   Malignant neoplasm of lower-outer quadrant of left breast of female, estrogen receptor positive ([place])   06/26/2016 Initial Diagnosis    Left breast biopsy 3:00: IDC with DCIS, grade 3, ER 80%, PR 20%, Ki-67 60%, HER-2 negative ratio 1.18; palpable lump: 1.8 cm lesion, no lymph nodes, T1 cN0 stage I a clinical stage      07/26/2016 Surgery    Left lumpectomy: IDC 1.8 cm, with DCIS, margins negative, 0/2 lymph nodes, ER 80%, PR 20%, HER-2 negative ratio 1.18, Ki-67 60%, T1cN0 stage IA       08/21/2016 Oncotype testing    Oncotype DX recurrence score 51, risk of recurrence 34%      08/21/2016 Genetic Testing    Genetic counseling and testing for hereditary cancer syndromes performed on 08/21/2016. Results are negative for pathogenic mutations in 46 genes analyzed by [facility]'s Common Hereditary Cancers Panel. Results are dated 09/14/2016. Genes tested: APC, ATM, AXIN2, BARD1, BMPR1A, BRCA1, BRCA2, BRIP1, CDH1, CDKN2A, CHEK2, CTNNA1, DICER1, EPCAM, GREM1, HOXB13, KIT, MEN1, MLH1, MSH2, MSH3, MSH6, MUTYH, NBN, NF1, NTHL1, PALB2, PDGFRA, PMS2, POLD1, POLE, PTEN, RAD50, RAD51C, RAD51D, SDHA, SDHB, SDHC, SDHD, SMAD4, SMARCA4, STK11, TP53, TSC1, TSC2, and VHL.        11/02/2016 -  Chemotherapy    Dose dense Adriamycin and Cytoxan 4 followed by Taxol weekly 12       CURRENT THERAPY: Taxol week 7  INTERVAL HISTORY: [name] 56 y.o. female returns for evaluation prior to receiving week 7 of Taxol.  She had her treatment held last week due to  neutropenia.  She is under the impression that if she has any more issues with her chemo, she may need to stop.  She is having occasional episodes of epistaxis.  She is using saline nasal spray which has helped.  She has intermittent neuropathy in her great toes.  She says she does feel like her legs are weighted down on occasion, but that resolves once she gets up and moving around.     Patient Active Problem List   Diagnosis Date Noted  . Chemotherapy-induced peripheral neuropathy ([place]) 02/15/2017  . Port catheter in place 11/02/2016  . Genetic testing 09/19/2016  . Pre-operative clearance 07/23/2016  . Malignant neoplasm of lower-outer quadrant of left breast of female, estrogen receptor positive ([place]) 07/10/2016  . Essential hypertension   . Chest pain 09/30/2015  . DVT, lower extremity ([place]) 06/18/2011  . DVT of upper extremity (deep vein thrombosis) ([place]) 06/18/2011  . [place] filter in place 06/18/2011  . History of stroke 06/18/2011  . PFO (patent foramen ovale) 06/18/2011  . Status post gastric bypass for obesity 06/18/2011  . PELVIC PAIN, CHRONIC 07/20/2008  . FIBROIDS, UTERUS 07/16/2008  . ASTHMA 07/16/2008  . FIBROMYALGIA 07/16/2008  . CARPAL TUNNEL SYNDROME, HX OF 07/16/2008  . History of pulmonary embolus (PE) 07/16/2008    is allergic to aspirin; oxycodone hcl; propoxyphene n-acetaminophen; tramadol; adhesive [tape]; and prednisone.  MEDICAL HISTORY: Past Medical History:  Diagnosis Date  . Anemia    since bypass  . Arthritis  osteoarthritis  . Asthma    states no asthma attack since 2002  . Breast cancer ([place]) 06/26/16 bx   left breast  . Chronic back pain   . Complication of anesthesia    states takes more than normal to put her to sleep  . Dental bridge present    upper  . Dental crowns present   . DVT of upper extremity (deep vein thrombosis) ([place])   . Fibromyalgia   . Genetic testing 09/19/2016   [name] underwent genetic counseling and testing  for hereditary cancer syndromes on 08/21/2016. Her results were negative for mutations in all 46 genes analyzed by [facility]'s 46-gene Common Hereditary Cancers Panel. Genes analyzed include: APC, ATM, AXIN2, BARD1, BMPR1A, BRCA1, BRCA2, BRIP1, CDH1, CDKN2A, CHEK2, CTNNA1, DICER1, EPCAM, GREM1, HOXB13, KIT, MEN1, MLH1, MSH2, MSH3, MSH6, MUTYH, NBN,   . Headache(784.0)    migraines  . History of blood transfusion 06/2005  . History of gallstones   . History of pneumonia   . History of shingles 07/2011  . HTN (hypertension)   . Normal coronary arteries 2003  . PFO (patent foramen ovale)   . Presence of inferior vena cava filter   . Pulmonary embolism ([place])   . Sleep apnea    used CPAP until after bypass surg.  . Status post gastric bypass for obesity   . Stroke [facility]) 12/2002   right-sided weakness  . Urinary incontinence     SURGICAL HISTORY: Past Surgical History:  Procedure Laterality Date  . ABDOMINAL HYSTERECTOMY  11/1997   complete  . ANTERIOR CERVICAL DECOMP/DISCECTOMY FUSION  02/05/2005   C5-6  . APPENDECTOMY  10/22/2008   laparoscopic  . BREAST LUMPECTOMY WITH RADIOACTIVE SEED AND SENTINEL LYMPH NODE BIOPSY Left 07/26/2016   Procedure: BREAST LUMPECTOMY WITH RADIOACTIVE SEED AND SENTINEL LYMPH NODE BIOPSY;  Surgeon: Erroll Luna, MD;  Location: [facility];  Service: General;  Laterality: Left;  . BUNIONECTOMY  05/1980   both feet  . BUNIONECTOMY  08/2011   left foot  . CARDIAC CATHETERIZATION  03/04/2002  . CARPAL TUNNEL RELEASE  06/21/2009   right  . CARPAL TUNNEL RELEASE     left hand  . CARPAL TUNNEL RELEASE  10/03/2011   Procedure: CARPAL TUNNEL RELEASE;  Surgeon: Wynonia Sours, MD;  Location: [facility] [place];  Service: Orthopedics;  Laterality: Right;  CARPAL TUNNEL WITH HYPOTHENAR FAT PAD TRANSFER  . CERVICAL SPINE SURGERY  01/2005   titanium plate implanted  . CHOLECYSTECTOMY  1990  . ELBOW SURGERY  08/09/2004   decompression ulnar nerve right elbow   . ENTEROLYSIS  10/22/2008   laparoscopic abd. enterolysis  . GASTRIC ROUX-EN-Y  2009  . HEEL SPUR SURGERY  08/1997   left  . HEMORRHOID SURGERY  03/1993  . LAPAROSCOPIC LYSIS INTESTINAL ADHESIONS  02/14/2000  . NAILBED REPAIR  01/10/2005; 08/2011   exc. matrix bilat. great toe  . OTHER SURGICAL HISTORY  12/1986   pt states that she had surgery to unclog her fallopean tubes  . PORTACATH PLACEMENT N/A 09/24/2016   Procedure: INSERTION PORT-A-CATH WITH [name];  Surgeon: Erroll Luna, MD;  Location: [facility];  Service: General;  Laterality: N/A;  . SHOULDER SURGERY     bilat. - (left:  06/2005)  . TONSILLECTOMY  07/1995  . TRIGGER FINGER RELEASE  04/25/2006   decompression A-1 pulley left thumb  . UTERINE FIBROID SURGERY  12/95, 7/96   x2  . VENA CAVA FILTER PLACEMENT  2009  during Roux-en-Y surg.    SOCIAL HISTORY: Social History   Social History  . Marital status: Single    Spouse name: N/A  . Number of children: 0  . Years of education: N/A   Occupational History  . disabled    Social History Main Topics  . Smoking status: Former Research scientist (life sciences)  . Smokeless tobacco: Never Used     Comment: quit smoking 08/1989  . Alcohol use No  . Drug use: No  . Sexual activity: No   Other Topics Concern  . Not on file   Social History Narrative  . No narrative on file    FAMILY HISTORY: Family History  Problem Relation Age of Onset  . Breast cancer Paternal Aunt 2  . Cervical cancer Paternal Grandmother 74       d.40s  . Ovarian cancer Maternal Grandmother 23       d.23  . Colon polyps Father   . Diabetes Father        borderline  . Prostate cancer Father   . Hypertension Mother   . Hypertension Unknown   . Breast cancer Sister 7       treated with neoadjuvant chemo/radiation and lumpectomy    Review of Systems  Constitutional: Positive for fatigue. Negative for appetite change, chills, fever and unexpected weight change.  HENT:   Negative for hearing loss.   Eyes: Negative for  eye problems and icterus.  Respiratory: Negative for chest tightness, cough and shortness of breath.   Cardiovascular: Negative for chest pain, leg swelling and palpitations.  Gastrointestinal: Negative for abdominal distention, abdominal pain, constipation, nausea and vomiting.  Endocrine: Negative for hot flashes.  Genitourinary: Negative for difficulty urinating.   Musculoskeletal: Negative for arthralgias.  Skin: Negative for itching and rash.  Neurological: Positive for numbness (per HPI). Negative for dizziness, extremity weakness and headaches.  Hematological: Negative for adenopathy. Does not bruise/bleed easily.  Psychiatric/Behavioral: Negative for depression. The patient is not nervous/anxious.       PHYSICAL EXAMINATION  ECOG PERFORMANCE STATUS: 1 - Symptomatic but completely ambulatory  Vitals:   03/15/17 1333  BP: 111/65  Pulse: 65  Resp: 18  Temp: 98.2 F (36.8 C)  SpO2: 100%    Physical Exam  Constitutional: She is oriented to person, place, and time and well-developed, well-nourished, and in no distress.  HENT:  Head: Normocephalic and atraumatic.  Mouth/Throat: Oropharynx is clear and moist. No oropharyngeal exudate.  Eyes: Pupils are equal, round, and reactive to light. No scleral icterus.  Neck: Neck supple.  Cardiovascular: Normal rate, regular rhythm and normal heart sounds.   Pulmonary/Chest: Effort normal and breath sounds normal. No respiratory distress. She has no wheezes. She has no rales.  Abdominal: Soft. Bowel sounds are normal. She exhibits no distension. There is no tenderness.  Musculoskeletal: She exhibits no edema.  Lymphadenopathy:    She has no cervical adenopathy.  Neurological: She is alert and oriented to person, place, and time.  Psychiatric: Mood and affect normal.    LABORATORY DATA:  CBC    Component Value Date/Time   WBC 3.6 (L) 03/15/2017 1242   WBC 7.5 12/14/2016 0120   RBC 3.27 (L) 03/15/2017 1242   RBC 3.13 (L)  12/14/2016 0120   HGB 10.7 (L) 03/15/2017 1242   HCT 31.8 (L) 03/15/2017 1242   PLT 198 03/15/2017 1242   MCV 97.2 03/15/2017 1242   MCH 32.7 03/15/2017 1242   MCH 31.0 12/14/2016 0120   MCHC 33.6 03/15/2017 1242  MCHC 33.9 12/14/2016 0120   RDW 14.0 03/15/2017 1242   LYMPHSABS 0.9 03/15/2017 1242   MONOABS 0.5 03/15/2017 1242   EOSABS 0.0 03/15/2017 1242   BASOSABS 0.0 03/15/2017 1242    CMP     Component Value Date/Time   NA 136 03/15/2017 1242   K 4.1 03/15/2017 1242   CL 104 12/14/2016 0120   CO2 24 03/15/2017 1242   GLUCOSE 82 03/15/2017 1242   BUN 14.9 03/15/2017 1242   CREATININE 0.7 03/15/2017 1242   CALCIUM 9.3 03/15/2017 1242   PROT 6.9 03/15/2017 1242   ALBUMIN 3.9 03/15/2017 1242   AST 18 03/15/2017 1242   ALT 14 03/15/2017 1242   ALKPHOS 62 03/15/2017 1242   BILITOT 0.41 03/15/2017 1242   GFRNONAA >60 12/14/2016 0120   GFRAA >60 12/14/2016 0120       ASSESSMENT and PLAN:   Malignant neoplasm of lower-outer quadrant of left breast of female, estrogen receptor positive ([place]) 07/26/2016: Left lumpectomy: IDC 1.8 cm, with DCIS, margins negative, 0/2 lymph nodes, ER 80%, PR 20%, HER-2 negative ratio 1.18, Ki-67 60%, T1cN0 stage IA  Oncotype DX score 51: 10 year risk of recurrence greater than 34%, extremely high risk  Recommendation: 1. Adjuvant chemotherapy with Adriamycin/Cytoxan x 4, then weekly Taxol x 12 2. Adjuvant radiation therapy followed by 3. Adjuvant antiestrogen therapy -------------------------------------------------------------------------------------------------------------------------------- Current treatment: Cycle 7Taxol Chemotherapy toxicities: 1. Alopecia  2.Fatigue: Quite severe 3. Abdominal discomfort: With abdominal cramps 4. Thrush: Resolved with Diflucan 5. Parotitis: Received oralclindamycin. Improved 6. Diffuse body aches and pains 7. Anemia due to chemotherapy  8. Neutropenia due to chemotherapy: resolved  after cycle 7 delay 9. Chemotherapy-induced neuropathy: dosage has been reduced  Deklynn is doing well today.  She will proceed with chemotherapy today.  I reviewed her labs today and they are stable thus far (CMET pending).    Return to clinic for labs/taxol weekly, with provider visits every other week.    All questions were answered. The patient knows to call the clinic with any problems, questions or concerns. We can certainly see the patient much sooner if necessary.  A total of (30) minutes of face-to-face time was spent with this patient with greater than 50% of that time in counseling and care-coordination.  This note was electronically signed. Scot Dock, NP 03/15/2017

## 2017-03-15 ENCOUNTER — Telehealth: Payer: Self-pay | Admitting: Adult Health

## 2017-03-15 ENCOUNTER — Ambulatory Visit (HOSPITAL_BASED_OUTPATIENT_CLINIC_OR_DEPARTMENT_OTHER): Payer: Medicare Other

## 2017-03-15 ENCOUNTER — Ambulatory Visit (HOSPITAL_BASED_OUTPATIENT_CLINIC_OR_DEPARTMENT_OTHER): Payer: Medicare Other | Admitting: Adult Health

## 2017-03-15 ENCOUNTER — Other Ambulatory Visit (HOSPITAL_BASED_OUTPATIENT_CLINIC_OR_DEPARTMENT_OTHER): Payer: Medicare Other

## 2017-03-15 ENCOUNTER — Encounter: Payer: Self-pay | Admitting: Adult Health

## 2017-03-15 VITALS — BP 111/65 | HR 65 | Temp 98.2°F | Resp 18 | Ht 61.0 in | Wt 193.4 lb

## 2017-03-15 DIAGNOSIS — C50512 Malignant neoplasm of lower-outer quadrant of left female breast: Secondary | ICD-10-CM

## 2017-03-15 DIAGNOSIS — D701 Agranulocytosis secondary to cancer chemotherapy: Secondary | ICD-10-CM

## 2017-03-15 DIAGNOSIS — Z170 Estrogen receptor positive status [ER+]: Secondary | ICD-10-CM

## 2017-03-15 DIAGNOSIS — D6481 Anemia due to antineoplastic chemotherapy: Secondary | ICD-10-CM

## 2017-03-15 DIAGNOSIS — Z95828 Presence of other vascular implants and grafts: Secondary | ICD-10-CM

## 2017-03-15 DIAGNOSIS — Z452 Encounter for adjustment and management of vascular access device: Secondary | ICD-10-CM

## 2017-03-15 DIAGNOSIS — G620 Drug-induced polyneuropathy: Secondary | ICD-10-CM | POA: Diagnosis not present

## 2017-03-15 DIAGNOSIS — Z5111 Encounter for antineoplastic chemotherapy: Secondary | ICD-10-CM | POA: Diagnosis not present

## 2017-03-15 LAB — CBC WITH DIFFERENTIAL/PLATELET
BASO%: 0.8 % (ref 0.0–2.0)
Basophils Absolute: 0 10*3/uL (ref 0.0–0.1)
EOS%: 1.1 % (ref 0.0–7.0)
Eosinophils Absolute: 0 10*3/uL (ref 0.0–0.5)
HCT: 31.8 % — ABNORMAL LOW (ref 34.8–46.6)
HGB: 10.7 g/dL — ABNORMAL LOW (ref 11.6–15.9)
LYMPH%: 24.2 % (ref 14.0–49.7)
MCH: 32.7 pg (ref 25.1–34.0)
MCHC: 33.6 g/dL (ref 31.5–36.0)
MCV: 97.2 fL (ref 79.5–101.0)
MONO#: 0.5 10*3/uL (ref 0.1–0.9)
MONO%: 14.2 % — ABNORMAL HIGH (ref 0.0–14.0)
NEUT#: 2.2 10*3/uL (ref 1.5–6.5)
NEUT%: 59.7 % (ref 38.4–76.8)
Platelets: 198 10*3/uL (ref 145–400)
RBC: 3.27 10*6/uL — ABNORMAL LOW (ref 3.70–5.45)
RDW: 14 % (ref 11.2–14.5)
WBC: 3.6 10*3/uL — ABNORMAL LOW (ref 3.9–10.3)
lymph#: 0.9 10*3/uL (ref 0.9–3.3)
nRBC: 0 % (ref 0–0)

## 2017-03-15 LAB — COMPREHENSIVE METABOLIC PANEL
ALT: 14 U/L (ref 0–55)
AST: 18 U/L (ref 5–34)
Albumin: 3.9 g/dL (ref 3.5–5.0)
Alkaline Phosphatase: 62 U/L (ref 40–150)
Anion Gap: 9 mEq/L (ref 3–11)
BUN: 14.9 mg/dL (ref 7.0–26.0)
CO2: 24 mEq/L (ref 22–29)
Calcium: 9.3 mg/dL (ref 8.4–10.4)
Chloride: 104 mEq/L (ref 98–109)
Creatinine: 0.7 mg/dL (ref 0.6–1.1)
EGFR: >60 mL/min/{1.73_m2} (ref >60)
Glucose: 82 mg/dl (ref 70–140)
Potassium: 4.1 mEq/L (ref 3.5–5.1)
Sodium: 136 mEq/L (ref 136–145)
Total Bilirubin: 0.41 mg/dL (ref 0.20–1.20)
Total Protein: 6.9 g/dL (ref 6.4–8.3)

## 2017-03-15 MED ORDER — FAMOTIDINE IN NACL 20-0.9 MG/50ML-% IV SOLN
20.0000 mg | Freq: Once | INTRAVENOUS | Status: AC
  Administered 2017-03-15: 20 mg via INTRAVENOUS

## 2017-03-15 MED ORDER — SODIUM CHLORIDE 0.9% FLUSH
10.0000 mL | INTRAVENOUS | Status: DC | PRN
  Filled 2017-03-15: qty 10

## 2017-03-15 MED ORDER — SODIUM CHLORIDE 0.9 % IV SOLN
45.0000 mg/m2 | Freq: Once | INTRAVENOUS | Status: AC
  Administered 2017-03-15: 90 mg via INTRAVENOUS
  Filled 2017-03-15: qty 15

## 2017-03-15 MED ORDER — SODIUM CHLORIDE 0.9 % IV SOLN
Freq: Once | INTRAVENOUS | Status: AC
  Administered 2017-03-15: 15:00:00 via INTRAVENOUS

## 2017-03-15 MED ORDER — SODIUM CHLORIDE 0.9 % IV SOLN
20.0000 mg | Freq: Once | INTRAVENOUS | Status: AC
  Administered 2017-03-15: 20 mg via INTRAVENOUS
  Filled 2017-03-15: qty 2

## 2017-03-15 MED ORDER — FAMOTIDINE IN NACL 20-0.9 MG/50ML-% IV SOLN
INTRAVENOUS | Status: AC
  Filled 2017-03-15: qty 50

## 2017-03-15 MED ORDER — DIPHENHYDRAMINE HCL 50 MG/ML IJ SOLN
INTRAMUSCULAR | Status: AC
  Filled 2017-03-15: qty 1

## 2017-03-15 MED ORDER — DIPHENHYDRAMINE HCL 50 MG/ML IJ SOLN
25.0000 mg | Freq: Once | INTRAMUSCULAR | Status: AC
  Administered 2017-03-15: 25 mg via INTRAVENOUS

## 2017-03-15 MED ORDER — SODIUM CHLORIDE 0.9% FLUSH
10.0000 mL | Freq: Once | INTRAVENOUS | Status: AC
  Administered 2017-03-15: 10 mL
  Filled 2017-03-15: qty 10

## 2017-03-15 MED ORDER — HEPARIN SOD (PORK) LOCK FLUSH 100 UNIT/ML IV SOLN
500.0000 [IU] | Freq: Once | INTRAVENOUS | Status: DC | PRN
  Filled 2017-03-15: qty 5

## 2017-03-15 NOTE — Telephone Encounter (Signed)
Gave patient AVS and calendar of updated November schedule.

## 2017-03-15 NOTE — Patient Instructions (Signed)
Implanted Port Home Guide An implanted port is a type of central line that is placed under the skin. Central lines are used to provide IV access when treatment or nutrition needs to be given through a person's veins. Implanted ports are used for long-term IV access. An implanted port may be placed because:  You need IV medicine that would be irritating to the small veins in your hands or arms.  You need long-term IV medicines, such as antibiotics.  You need IV nutrition for a long period.  You need frequent blood draws for lab tests.  You need dialysis.  Implanted ports are usually placed in the chest area, but they can also be placed in the upper arm, the abdomen, or the leg. An implanted port has two main parts:  Reservoir. The reservoir is round and will appear as a small, raised area under your skin. The reservoir is the part where a needle is inserted to give medicines or draw blood.  Catheter. The catheter is a thin, flexible tube that extends from the reservoir. The catheter is placed into a large vein. Medicine that is inserted into the reservoir goes into the catheter and then into the vein.  How will I care for my incision site? Do not get the incision site wet. Bathe or shower as directed by your health care provider. How is my port accessed? Special steps must be taken to access the port:  Before the port is accessed, a numbing cream can be placed on the skin. This helps numb the skin over the port site.  Your health care provider uses a sterile technique to access the port. ? Your health care provider must put on a mask and sterile gloves. ? The skin over your port is cleaned carefully with an antiseptic and allowed to dry. ? The port is gently pinched between sterile gloves, and a needle is inserted into the port.  Only "non-coring" port needles should be used to access the port. Once the port is accessed, a blood return should be checked. This helps ensure that the port  is in the vein and is not clogged.  If your port needs to remain accessed for a constant infusion, a clear (transparent) bandage will be placed over the needle site. The bandage and needle will need to be changed every week, or as directed by your health care provider.  Keep the bandage covering the needle clean and dry. Do not get it wet. Follow your health care provider's instructions on how to take a shower or bath while the port is accessed.  If your port does not need to stay accessed, no bandage is needed over the port.  What is flushing? Flushing helps keep the port from getting clogged. Follow your health care provider's instructions on how and when to flush the port. Ports are usually flushed with saline solution or a medicine called heparin. The need for flushing will depend on how the port is used.  If the port is used for intermittent medicines or blood draws, the port will need to be flushed: ? After medicines have been given. ? After blood has been drawn. ? As part of routine maintenance.  If a constant infusion is running, the port may not need to be flushed.  How long will my port stay implanted? The port can stay in for as long as your health care provider thinks it is needed. When it is time for the port to come out, surgery will be   done to remove it. The procedure is similar to the one performed when the port was put in. When should I seek immediate medical care? When you have an implanted port, you should seek immediate medical care if:  You notice a bad smell coming from the incision site.  You have swelling, redness, or drainage at the incision site.  You have more swelling or pain at the port site or the surrounding area.  You have a fever that is not controlled with medicine.  This information is not intended to replace advice given to you by your health care provider. Make sure you discuss any questions you have with your health care provider. Document  Released: 05/07/2005 Document Revised: 10/13/2015 Document Reviewed: 01/12/2013 Elsevier Interactive Patient Education  2017 [employer]  

## 2017-03-15 NOTE — Patient Instructions (Signed)
[facility] Discharge Instructions for Patients Receiving Chemotherapy  Today you received the following chemotherapy agents :  Taxol.  To help prevent nausea and vomiting after your treatment, we encourage you to take your nausea medication as prescribed.   If you develop nausea and vomiting that is not controlled by your nausea medication, call the clinic.   BELOW ARE SYMPTOMS THAT SHOULD BE REPORTED IMMEDIATELY:  *FEVER GREATER THAN 100.5 F  *CHILLS WITH OR WITHOUT FEVER  NAUSEA AND VOMITING THAT IS NOT CONTROLLED WITH YOUR NAUSEA MEDICATION  *UNUSUAL SHORTNESS OF BREATH  *UNUSUAL BRUISING OR BLEEDING  TENDERNESS IN MOUTH AND THROAT WITH OR WITHOUT PRESENCE OF ULCERS  *URINARY PROBLEMS  *BOWEL PROBLEMS  UNUSUAL RASH Items with * indicate a potential emergency and should be followed up as soon as possible.  Feel free to call the clinic should you have any questions or concerns. The clinic phone number is [phone].  Please show the CHEMO ALERT CARD at check-in to the Emergency Department and triage nurse.   

## 2017-03-22 ENCOUNTER — Ambulatory Visit: Payer: Medicare Other

## 2017-03-22 ENCOUNTER — Other Ambulatory Visit (HOSPITAL_BASED_OUTPATIENT_CLINIC_OR_DEPARTMENT_OTHER): Payer: Medicare Other

## 2017-03-22 ENCOUNTER — Ambulatory Visit (HOSPITAL_BASED_OUTPATIENT_CLINIC_OR_DEPARTMENT_OTHER): Payer: Medicare Other

## 2017-03-22 VITALS — BP 114/61 | HR 75 | Temp 98.7°F | Resp 17

## 2017-03-22 DIAGNOSIS — Z170 Estrogen receptor positive status [ER+]: Secondary | ICD-10-CM

## 2017-03-22 DIAGNOSIS — Z95828 Presence of other vascular implants and grafts: Secondary | ICD-10-CM

## 2017-03-22 DIAGNOSIS — Z5111 Encounter for antineoplastic chemotherapy: Secondary | ICD-10-CM | POA: Diagnosis not present

## 2017-03-22 DIAGNOSIS — C50512 Malignant neoplasm of lower-outer quadrant of left female breast: Secondary | ICD-10-CM

## 2017-03-22 LAB — CBC WITH DIFFERENTIAL/PLATELET
BASO%: 0.8 % (ref 0.0–2.0)
Basophils Absolute: 0 10*3/uL (ref 0.0–0.1)
EOS%: 3.4 % (ref 0.0–7.0)
Eosinophils Absolute: 0.1 10*3/uL (ref 0.0–0.5)
HCT: 30.9 % — ABNORMAL LOW (ref 34.8–46.6)
HGB: 10.2 g/dL — ABNORMAL LOW (ref 11.6–15.9)
LYMPH%: 32.2 % (ref 14.0–49.7)
MCH: 32.4 pg (ref 25.1–34.0)
MCHC: 33 g/dL (ref 31.5–36.0)
MCV: 98.1 fL (ref 79.5–101.0)
MONO#: 0.2 10*3/uL (ref 0.1–0.9)
MONO%: 7.7 % (ref 0.0–14.0)
NEUT#: 1.5 10*3/uL (ref 1.5–6.5)
NEUT%: 55.9 % (ref 38.4–76.8)
Platelets: 172 10*3/uL (ref 145–400)
RBC: 3.15 10*6/uL — ABNORMAL LOW (ref 3.70–5.45)
RDW: 14 % (ref 11.2–14.5)
WBC: 2.6 10*3/uL — ABNORMAL LOW (ref 3.9–10.3)
lymph#: 0.8 10*3/uL — ABNORMAL LOW (ref 0.9–3.3)

## 2017-03-22 LAB — COMPREHENSIVE METABOLIC PANEL
ALT: 13 U/L (ref 0–55)
AST: 14 U/L (ref 5–34)
Albumin: 3.8 g/dL (ref 3.5–5.0)
Alkaline Phosphatase: 55 U/L (ref 40–150)
Anion Gap: 8 mEq/L (ref 3–11)
BUN: 14.1 mg/dL (ref 7.0–26.0)
CO2: 25 mEq/L (ref 22–29)
Calcium: 9 mg/dL (ref 8.4–10.4)
Chloride: 103 mEq/L (ref 98–109)
Creatinine: 0.7 mg/dL (ref 0.6–1.1)
EGFR: >60 mL/min/{1.73_m2} (ref >60)
Glucose: 119 mg/dl (ref 70–140)
Potassium: 4.2 mEq/L (ref 3.5–5.1)
Sodium: 136 mEq/L (ref 136–145)
Total Bilirubin: 0.3 mg/dL (ref 0.20–1.20)
Total Protein: 6.7 g/dL (ref 6.4–8.3)

## 2017-03-22 MED ORDER — FAMOTIDINE IN NACL 20-0.9 MG/50ML-% IV SOLN
20.0000 mg | Freq: Once | INTRAVENOUS | Status: AC
  Administered 2017-03-22: 20 mg via INTRAVENOUS

## 2017-03-22 MED ORDER — ACETAMINOPHEN 500 MG PO TABS
1000.0000 mg | ORAL_TABLET | Freq: Once | ORAL | Status: AC
  Administered 2017-03-22: 1000 mg via ORAL

## 2017-03-22 MED ORDER — FAMOTIDINE IN NACL 20-0.9 MG/50ML-% IV SOLN
INTRAVENOUS | Status: AC
  Filled 2017-03-22: qty 50

## 2017-03-22 MED ORDER — DIPHENHYDRAMINE HCL 50 MG/ML IJ SOLN
INTRAMUSCULAR | Status: AC
  Filled 2017-03-22: qty 1

## 2017-03-22 MED ORDER — PACLITAXEL CHEMO INJECTION 300 MG/50ML
45.0000 mg/m2 | Freq: Once | INTRAVENOUS | Status: AC
  Administered 2017-03-22: 90 mg via INTRAVENOUS
  Filled 2017-03-22: qty 15

## 2017-03-22 MED ORDER — DIPHENHYDRAMINE HCL 50 MG/ML IJ SOLN
25.0000 mg | Freq: Once | INTRAMUSCULAR | Status: AC
  Administered 2017-03-22: 25 mg via INTRAVENOUS

## 2017-03-22 MED ORDER — ACETAMINOPHEN 500 MG PO TABS
ORAL_TABLET | ORAL | Status: AC
  Filled 2017-03-22: qty 2

## 2017-03-22 MED ORDER — SODIUM CHLORIDE 0.9% FLUSH
10.0000 mL | INTRAVENOUS | Status: DC | PRN
  Administered 2017-03-22: 10 mL
  Filled 2017-03-22: qty 10

## 2017-03-22 MED ORDER — SODIUM CHLORIDE 0.9% FLUSH
10.0000 mL | Freq: Once | INTRAVENOUS | Status: AC
  Administered 2017-03-22: 10 mL
  Filled 2017-03-22: qty 10

## 2017-03-22 MED ORDER — HEPARIN SOD (PORK) LOCK FLUSH 100 UNIT/ML IV SOLN
500.0000 [IU] | Freq: Once | INTRAVENOUS | Status: AC | PRN
  Administered 2017-03-22: 500 [IU]
  Filled 2017-03-22: qty 5

## 2017-03-22 MED ORDER — DEXAMETHASONE SODIUM PHOSPHATE 100 MG/10ML IJ SOLN
20.0000 mg | Freq: Once | INTRAMUSCULAR | Status: AC
  Administered 2017-03-22: 20 mg via INTRAVENOUS
  Filled 2017-03-22: qty 2

## 2017-03-22 MED ORDER — SODIUM CHLORIDE 0.9 % IV SOLN
Freq: Once | INTRAVENOUS | Status: AC
  Administered 2017-03-22: 09:00:00 via INTRAVENOUS

## 2017-03-22 NOTE — Progress Notes (Signed)
Upon assessment, patient reported new tingling in BI feet "in my big toes". Mendel Ryder, NP notified. Per Mendel Ryder, patient instructed to continue to monitor frequency and intensity of neuropathy symptoms. OK to treat today with assessment findings and lab values.   Wylene Simmer, BSN, RN 03/22/2017 9:21 AM

## 2017-03-22 NOTE — Patient Instructions (Signed)

## 2017-03-29 ENCOUNTER — Ambulatory Visit (HOSPITAL_BASED_OUTPATIENT_CLINIC_OR_DEPARTMENT_OTHER): Payer: Medicare Other

## 2017-03-29 ENCOUNTER — Other Ambulatory Visit (HOSPITAL_BASED_OUTPATIENT_CLINIC_OR_DEPARTMENT_OTHER): Payer: Medicare Other

## 2017-03-29 ENCOUNTER — Other Ambulatory Visit: Payer: Medicare Other

## 2017-03-29 ENCOUNTER — Ambulatory Visit (HOSPITAL_BASED_OUTPATIENT_CLINIC_OR_DEPARTMENT_OTHER): Payer: Medicare Other | Admitting: Adult Health

## 2017-03-29 ENCOUNTER — Encounter: Payer: Self-pay | Admitting: Adult Health

## 2017-03-29 VITALS — BP 130/77 | HR 77 | Temp 98.5°F | Resp 18 | Ht 61.0 in | Wt 195.5 lb

## 2017-03-29 DIAGNOSIS — Z170 Estrogen receptor positive status [ER+]: Secondary | ICD-10-CM

## 2017-03-29 DIAGNOSIS — C50512 Malignant neoplasm of lower-outer quadrant of left female breast: Secondary | ICD-10-CM | POA: Diagnosis not present

## 2017-03-29 DIAGNOSIS — Z95828 Presence of other vascular implants and grafts: Secondary | ICD-10-CM

## 2017-03-29 DIAGNOSIS — Z5111 Encounter for antineoplastic chemotherapy: Secondary | ICD-10-CM | POA: Diagnosis not present

## 2017-03-29 DIAGNOSIS — D701 Agranulocytosis secondary to cancer chemotherapy: Secondary | ICD-10-CM | POA: Diagnosis not present

## 2017-03-29 DIAGNOSIS — D6481 Anemia due to antineoplastic chemotherapy: Secondary | ICD-10-CM

## 2017-03-29 LAB — COMPREHENSIVE METABOLIC PANEL
ALT: 13 U/L (ref 0–55)
AST: 15 U/L (ref 5–34)
Albumin: 3.9 g/dL (ref 3.5–5.0)
Alkaline Phosphatase: 53 U/L (ref 40–150)
Anion Gap: 8 mEq/L (ref 3–11)
BUN: 13.3 mg/dL (ref 7.0–26.0)
CO2: 26 mEq/L (ref 22–29)
Calcium: 9.2 mg/dL (ref 8.4–10.4)
Chloride: 103 mEq/L (ref 98–109)
Creatinine: 0.7 mg/dL (ref 0.6–1.1)
EGFR: >60 mL/min/{1.73_m2} (ref >60)
Glucose: 90 mg/dl (ref 70–140)
Potassium: 4.1 mEq/L (ref 3.5–5.1)
Sodium: 137 mEq/L (ref 136–145)
Total Bilirubin: 0.34 mg/dL (ref 0.20–1.20)
Total Protein: 6.9 g/dL (ref 6.4–8.3)

## 2017-03-29 LAB — CBC WITH DIFFERENTIAL/PLATELET
BASO%: 0.3 % (ref 0.0–2.0)
Basophils Absolute: 0 10*3/uL (ref 0.0–0.1)
EOS%: 3 % (ref 0.0–7.0)
Eosinophils Absolute: 0.1 10*3/uL (ref 0.0–0.5)
HCT: 31.5 % — ABNORMAL LOW (ref 34.8–46.6)
HGB: 10.5 g/dL — ABNORMAL LOW (ref 11.6–15.9)
LYMPH%: 31.3 % (ref 14.0–49.7)
MCH: 32.4 pg (ref 25.1–34.0)
MCHC: 33.3 g/dL (ref 31.5–36.0)
MCV: 97.2 fL (ref 79.5–101.0)
MONO#: 0.2 10*3/uL (ref 0.1–0.9)
MONO%: 6.9 % (ref 0.0–14.0)
NEUT#: 1.8 10*3/uL (ref 1.5–6.5)
NEUT%: 58.5 % (ref 38.4–76.8)
Platelets: 195 10*3/uL (ref 145–400)
RBC: 3.24 10*6/uL — ABNORMAL LOW (ref 3.70–5.45)
RDW: 14.5 % (ref 11.2–14.5)
WBC: 3 10*3/uL — ABNORMAL LOW (ref 3.9–10.3)
lymph#: 1 10*3/uL (ref 0.9–3.3)

## 2017-03-29 MED ORDER — FAMOTIDINE IN NACL 20-0.9 MG/50ML-% IV SOLN
20.0000 mg | Freq: Once | INTRAVENOUS | Status: AC
  Administered 2017-03-29: 20 mg via INTRAVENOUS

## 2017-03-29 MED ORDER — DIPHENHYDRAMINE HCL 50 MG/ML IJ SOLN
INTRAMUSCULAR | Status: AC
  Filled 2017-03-29: qty 1

## 2017-03-29 MED ORDER — SODIUM CHLORIDE 0.9% FLUSH
10.0000 mL | INTRAVENOUS | Status: DC | PRN
  Administered 2017-03-29: 10 mL
  Filled 2017-03-29: qty 10

## 2017-03-29 MED ORDER — SODIUM CHLORIDE 0.9 % IV SOLN
20.0000 mg | Freq: Once | INTRAVENOUS | Status: AC
  Administered 2017-03-29: 20 mg via INTRAVENOUS
  Filled 2017-03-29: qty 2

## 2017-03-29 MED ORDER — SODIUM CHLORIDE 0.9 % IV SOLN
45.0000 mg/m2 | Freq: Once | INTRAVENOUS | Status: AC
  Administered 2017-03-29: 90 mg via INTRAVENOUS
  Filled 2017-03-29: qty 15

## 2017-03-29 MED ORDER — SODIUM CHLORIDE 0.9 % IV SOLN
Freq: Once | INTRAVENOUS | Status: AC
  Administered 2017-03-29: 15:00:00 via INTRAVENOUS

## 2017-03-29 MED ORDER — FAMOTIDINE IN NACL 20-0.9 MG/50ML-% IV SOLN
INTRAVENOUS | Status: AC
  Filled 2017-03-29: qty 50

## 2017-03-29 MED ORDER — SODIUM CHLORIDE 0.9% FLUSH
10.0000 mL | Freq: Once | INTRAVENOUS | Status: AC
  Administered 2017-03-29: 10 mL
  Filled 2017-03-29: qty 10

## 2017-03-29 MED ORDER — HEPARIN SOD (PORK) LOCK FLUSH 100 UNIT/ML IV SOLN
500.0000 [IU] | Freq: Once | INTRAVENOUS | Status: AC | PRN
  Administered 2017-03-29: 500 [IU]
  Filled 2017-03-29: qty 5

## 2017-03-29 MED ORDER — DIPHENHYDRAMINE HCL 50 MG/ML IJ SOLN
25.0000 mg | Freq: Once | INTRAMUSCULAR | Status: AC
  Administered 2017-03-29: 25 mg via INTRAVENOUS

## 2017-03-29 NOTE — Patient Instructions (Signed)
[facility] Discharge Instructions for Patients Receiving Chemotherapy  Today you received the following chemotherapy agents:  Taxol.  To help prevent nausea and vomiting after your treatment, we encourage you to take your nausea medication as directed.   If you develop nausea and vomiting that is not controlled by your nausea medication, call the clinic.   BELOW ARE SYMPTOMS THAT SHOULD BE REPORTED IMMEDIATELY:  *FEVER GREATER THAN 100.5 F  *CHILLS WITH OR WITHOUT FEVER  NAUSEA AND VOMITING THAT IS NOT CONTROLLED WITH YOUR NAUSEA MEDICATION  *UNUSUAL SHORTNESS OF BREATH  *UNUSUAL BRUISING OR BLEEDING  TENDERNESS IN MOUTH AND THROAT WITH OR WITHOUT PRESENCE OF ULCERS  *URINARY PROBLEMS  *BOWEL PROBLEMS  UNUSUAL RASH Items with * indicate a potential emergency and should be followed up as soon as possible.  Feel free to call the clinic should you have any questions or concerns. The clinic phone number is [phone].  Please show the CHEMO ALERT CARD at check-in to the Emergency Department and triage nurse.   

## 2017-03-29 NOTE — Progress Notes (Signed)
[facility] Follow up:    Alicia Frees, MD [address] [place] [address]   DIAGNOSIS: Cancer Staging Malignant neoplasm of lower-outer quadrant of left breast of female, estrogen receptor positive ([place]) Staging form: Breast, AJCC 8th Edition - Clinical: Stage IA (cT1c, cN0(sn), cM0, G3, ER: Positive, PR: Positive, HER2: Negative, Oncotype DX score: 51) - Unsigned   SUMMARY OF ONCOLOGIC HISTORY:   Malignant neoplasm of lower-outer quadrant of left breast of female, estrogen receptor positive ([place])   06/26/2016 Initial Diagnosis    Left breast biopsy 3:00: IDC with DCIS, grade 3, ER 80%, PR 20%, Ki-67 60%, HER-2 negative ratio 1.18; palpable lump: 1.8 cm lesion, no lymph nodes, T1 cN0 stage I a clinical stage      07/26/2016 Surgery    Left lumpectomy: IDC 1.8 cm, with DCIS, margins negative, 0/2 lymph nodes, ER 80%, PR 20%, HER-2 negative ratio 1.18, Ki-67 60%, T1cN0 stage IA       08/21/2016 Oncotype testing    Oncotype DX recurrence score 51, risk of recurrence 34%      08/21/2016 Genetic Testing    Genetic counseling and testing for hereditary cancer syndromes performed on 08/21/2016. Results are negative for pathogenic mutations in 46 genes analyzed by [facility]'s Common Hereditary Cancers Panel. Results are dated 09/14/2016. Genes tested: APC, ATM, AXIN2, BARD1, BMPR1A, BRCA1, BRCA2, BRIP1, CDH1, CDKN2A, CHEK2, CTNNA1, DICER1, EPCAM, GREM1, HOXB13, KIT, MEN1, MLH1, MSH2, MSH3, MSH6, MUTYH, NBN, NF1, NTHL1, PALB2, PDGFRA, PMS2, POLD1, POLE, PTEN, RAD50, RAD51C, RAD51D, SDHA, SDHB, SDHC, SDHD, SMAD4, SMARCA4, STK11, TP53, TSC1, TSC2, and VHL.        11/02/2016 -  Chemotherapy    Dose dense Adriamycin and Cytoxan 4 followed by Taxol weekly 12       CURRENT THERAPY: Taxol week 9  INTERVAL HISTORY: [name] 56 y.o. female returns for evaluation prior to receiving her ninth cycle of Taxol.  She continues to experience numbness in  her fingertips that is stable.  She denies any worsening.  It remains in just the tips of her fingers and toes, and is intermittent.     Patient Active Problem List   Diagnosis Date Noted  . Chemotherapy-induced peripheral neuropathy ([place]) 02/15/2017  . Port catheter in place 11/02/2016  . Genetic testing 09/19/2016  . Pre-operative clearance 07/23/2016  . Malignant neoplasm of lower-outer quadrant of left breast of female, estrogen receptor positive ([place]) 07/10/2016  . Essential hypertension   . Chest pain 09/30/2015  . DVT, lower extremity ([place]) 06/18/2011  . DVT of upper extremity (deep vein thrombosis) ([place]) 06/18/2011  . [place] filter in place 06/18/2011  . History of stroke 06/18/2011  . PFO (patent foramen ovale) 06/18/2011  . Status post gastric bypass for obesity 06/18/2011  . PELVIC PAIN, CHRONIC 07/20/2008  . FIBROIDS, UTERUS 07/16/2008  . ASTHMA 07/16/2008  . FIBROMYALGIA 07/16/2008  . CARPAL TUNNEL SYNDROME, HX OF 07/16/2008  . History of pulmonary embolus (PE) 07/16/2008    is allergic to aspirin; oxycodone hcl; propoxyphene n-acetaminophen; tramadol; adhesive [tape]; and prednisone.  MEDICAL HISTORY: Past Medical History:  Diagnosis Date  . Anemia    since bypass  . Arthritis    osteoarthritis  . Asthma    states no asthma attack since 2002  . Breast cancer ([place]) 06/26/16 bx   left breast  . Chronic back pain   . Complication of anesthesia    states takes more than normal to put her to sleep  .  Dental bridge present    upper  . Dental crowns present   . DVT of upper extremity (deep vein thrombosis) ([place])   . Fibromyalgia   . Genetic testing 09/19/2016   Ms. Linson underwent genetic counseling and testing for hereditary cancer syndromes on 08/21/2016. Her results were negative for mutations in all 46 genes analyzed by [facility]'s 46-gene Common Hereditary Cancers Panel. Genes analyzed include: APC, ATM, AXIN2, BARD1, BMPR1A, BRCA1, BRCA2, BRIP1, CDH1,  CDKN2A, CHEK2, CTNNA1, DICER1, EPCAM, GREM1, HOXB13, KIT, MEN1, MLH1, MSH2, MSH3, MSH6, MUTYH, NBN,   . Headache(784.0)    migraines  . History of blood transfusion 06/2005  . History of gallstones   . History of pneumonia   . History of shingles 07/2011  . HTN (hypertension)   . Normal coronary arteries 2003  . PFO (patent foramen ovale)   . Presence of inferior vena cava filter   . Pulmonary embolism ([place])   . Sleep apnea    used CPAP until after bypass surg.  . Status post gastric bypass for obesity   . Stroke [facility]) 12/2002   right-sided weakness  . Urinary incontinence     SURGICAL HISTORY: Past Surgical History:  Procedure Laterality Date  . ABDOMINAL HYSTERECTOMY  11/1997   complete  . ANTERIOR CERVICAL DECOMP/DISCECTOMY FUSION  02/05/2005   C5-6  . APPENDECTOMY  10/22/2008   laparoscopic  . BUNIONECTOMY  05/1980   both feet  . BUNIONECTOMY  08/2011   left foot  . CARDIAC CATHETERIZATION  03/04/2002  . CARPAL TUNNEL RELEASE  06/21/2009   right  . CARPAL TUNNEL RELEASE     left hand  . CERVICAL SPINE SURGERY  01/2005   titanium plate implanted  . CHOLECYSTECTOMY  1990  . ELBOW SURGERY  08/09/2004   decompression ulnar nerve right elbow  . ENTEROLYSIS  10/22/2008   laparoscopic abd. enterolysis  . GASTRIC ROUX-EN-Y  2009  . HEEL SPUR SURGERY  08/1997   left  . HEMORRHOID SURGERY  03/1993  . LAPAROSCOPIC LYSIS INTESTINAL ADHESIONS  02/14/2000  . NAILBED REPAIR  01/10/2005; 08/2011   exc. matrix bilat. great toe  . OTHER SURGICAL HISTORY  12/1986   pt states that she had surgery to unclog her fallopean tubes  . SHOULDER SURGERY     bilat. - (left:  06/2005)  . TONSILLECTOMY  07/1995  . TRIGGER FINGER RELEASE  04/25/2006   decompression A-1 pulley left thumb  . UTERINE FIBROID SURGERY  12/95, 7/96   x2  . VENA CAVA FILTER PLACEMENT  2009   during Roux-en-Y surg.    SOCIAL HISTORY: Social History   Socioeconomic History  . Marital status: Single    Spouse name: Not on  file  . Number of children: 0  . Years of education: Not on file  . Highest education level: Not on file  Social Needs  . Financial resource strain: Not on file  . Food insecurity - worry: Not on file  . Food insecurity - inability: Not on file  . Transportation needs - medical: Not on file  . Transportation needs - non-medical: Not on file  Occupational History  . Occupation: disabled  Tobacco Use  . Smoking status: Former Research scientist (life sciences)  . Smokeless tobacco: Never Used  . Tobacco comment: quit smoking 08/1989  Substance and Sexual Activity  . Alcohol use: No  . Drug use: No  . Sexual activity: No    Birth control/protection: Surgical  Other Topics Concern  . Not on file  Social History Narrative  . Not on file    FAMILY HISTORY: Family History  Problem Relation Age of Onset  . Breast cancer Paternal Aunt 25  . Cervical cancer Paternal Grandmother 34       d.40s  . Ovarian cancer Maternal Grandmother 23       d.23  . Colon polyps Father   . Diabetes Father        borderline  . Prostate cancer Father   . Hypertension Mother   . Hypertension Unknown   . Breast cancer Sister 11       treated with neoadjuvant chemo/radiation and lumpectomy    Review of Systems  Constitutional: Negative for appetite change, chills, fatigue, fever and unexpected weight change.  HENT:   Negative for hearing loss, lump/mass, nosebleeds, sore throat and trouble swallowing.   Eyes: Negative for eye problems and icterus.  Respiratory: Negative for chest tightness, cough and shortness of breath.   Cardiovascular: Negative for chest pain, leg swelling and palpitations.  Gastrointestinal: Negative for abdominal distention and abdominal pain.  Endocrine: Negative for hot flashes.  Genitourinary: Negative for difficulty urinating.   Musculoskeletal: Negative for arthralgias.  Skin: Negative for itching and rash.  Neurological: Positive for numbness (see interval history). Negative for dizziness,  extremity weakness and headaches.  Hematological: Negative for adenopathy. Does not bruise/bleed easily.  Psychiatric/Behavioral: Negative for depression. The patient is not nervous/anxious.       PHYSICAL EXAMINATION  ECOG PERFORMANCE STATUS: 1 - Symptomatic but completely ambulatory  Vitals:   03/29/17 1415  BP: 130/77  Pulse: 77  Resp: 18  Temp: 98.5 F (36.9 C)  SpO2: 96%    Physical Exam  Constitutional: She is oriented to person, place, and time and well-developed, well-nourished, and in no distress.  HENT:  Head: Normocephalic and atraumatic.  Mouth/Throat: Oropharynx is clear and moist. No oropharyngeal exudate.  Eyes: Pupils are equal, round, and reactive to light. No scleral icterus.  Neck: Neck supple.  Cardiovascular: Normal rate, regular rhythm, normal heart sounds and intact distal pulses.  Pulmonary/Chest: Effort normal and breath sounds normal. No respiratory distress. She has no wheezes. She has no rales.  Abdominal: Soft. Bowel sounds are normal. She exhibits no distension and no mass. There is no tenderness. There is no rebound and no guarding.  Musculoskeletal: She exhibits no edema.  Lymphadenopathy:    She has no cervical adenopathy.  Neurological: She is alert and oriented to person, place, and time.  Skin: Skin is warm and dry. No rash noted. No erythema.  Psychiatric: Mood and affect normal.    LABORATORY DATA:  CBC    Component Value Date/Time   WBC 3.0 (L) 03/29/2017 1410   WBC 7.5 12/14/2016 0120   RBC 3.24 (L) 03/29/2017 1410   RBC 3.13 (L) 12/14/2016 0120   HGB 10.5 (L) 03/29/2017 1410   HCT 31.5 (L) 03/29/2017 1410   PLT 195 03/29/2017 1410   MCV 97.2 03/29/2017 1410   MCH 32.4 03/29/2017 1410   MCH 31.0 12/14/2016 0120   MCHC 33.3 03/29/2017 1410   MCHC 33.9 12/14/2016 0120   RDW 14.5 03/29/2017 1410   LYMPHSABS 1.0 03/29/2017 1410   MONOABS 0.2 03/29/2017 1410   EOSABS 0.1 03/29/2017 1410   BASOSABS 0.0 03/29/2017 1410     CMP     Component Value Date/Time   NA 137 03/29/2017 1409   K 4.1 03/29/2017 1409   CL 104 12/14/2016 0120   CO2 26 03/29/2017 1409  GLUCOSE 90 03/29/2017 1409   BUN 13.3 03/29/2017 1409   CREATININE 0.7 03/29/2017 1409   CALCIUM 9.2 03/29/2017 1409   PROT 6.9 03/29/2017 1409   ALBUMIN 3.9 03/29/2017 1409   AST 15 03/29/2017 1409   ALT 13 03/29/2017 1409   ALKPHOS 53 03/29/2017 1409   BILITOT 0.34 03/29/2017 1409   GFRNONAA >60 12/14/2016 0120   GFRAA >60 12/14/2016 0120         ASSESSMENT and PLAN:   Malignant neoplasm of lower-outer quadrant of left breast of female, estrogen receptor positive ([place]) 07/26/2016: Left lumpectomy: IDC 1.8 cm, with DCIS, margins negative, 0/2 lymph nodes, ER 80%, PR 20%, HER-2 negative ratio 1.18, Ki-67 60%, T1cN0 stage IA  Oncotype DX score 51: 10 year risk of recurrence greater than 34%, extremely high risk  Recommendation: 1. Adjuvant chemotherapy with Adriamycin/Cytoxan x 4, then weekly Taxol x 12 2. Adjuvant radiation therapy followed by 3. Adjuvant antiestrogen therapy -------------------------------------------------------------------------------------------------------------------------------- Current treatment: Cycle 7Taxol Chemotherapy toxicities: 1. Alopecia  2.Fatigue: Quite severe 3. Abdominal discomfort: With abdominal cramps 4. Thrush: Resolved with Diflucan 5. Parotitis: Received oralclindamycin. Improved 6. Diffuse body aches and pains 7. Anemia due to chemotherapy  8. Neutropenia due to chemotherapy: resolved after cycle 7 delay 9. Chemotherapy-induced neuropathy: dosage has been reduced  Terriana is doing well today.  We spent a great deal talking about the future, the role of imaging in her follow up care (or lack there of), her follow up with Dr. Lisbeth Renshaw, and starting on anti estrogen therapy, including her survivorship visit.  She continues to remain stable.  She knows to inform us if her neuropathy  worsens.  Her WBC are normal today and she is happy with this.     Return to clinic for labs/taxol weekly, with provider visits every other week.     All questions were answered. The patient knows to call the clinic with any problems, questions or concerns. We can certainly see the patient much sooner if necessary.  A total of (30) minutes of face-to-face time was spent with this patient with greater than 50% of that time in counseling and care-coordination.  This note was electronically signed. Scot Dock, NP 03/29/2017

## 2017-03-29 NOTE — Assessment & Plan Note (Addendum)
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 Adjuvant chemotherapy with Adriamycin/Cytoxan x 4, then weekly Taxol x 12 2. Adjuvant radiation therapy followed by 3. Adjuvant antiestrogen therapy -------------------------------------------------------------------------------------------------------------------------------- Current treatment: Cycle 7Taxol Chemotherapy toxicities: 1. Alopecia  2.Fatigue: Quite severe 3. Abdominal discomfort: With abdominal cramps 4. Thrush: Resolved with Diflucan 5. Parotitis: Received oralclindamycin. Improved 6. Diffuse body aches and pains 7. Anemia due to chemotherapy  8. Neutropenia due to chemotherapy: resolved after cycle 7 delay 9. Chemotherapy-induced neuropathy: dosage has been reduced  [name] is doing well today.  We spent a great deal talking about the future, the role of imaging in her follow up care (or lack there of), her follow up with Dr. Lisbeth Renshaw, and starting on anti estrogen therapy, including her survivorship visit.  She continues to remain stable.  She knows to inform us if her neuropathy worsens.  Her WBC are normal today and she is happy with this.     Return to clinic for labs/taxol weekly, with provider visits every other week.

## 2017-03-29 NOTE — Patient Instructions (Signed)

## 2017-04-01 ENCOUNTER — Ambulatory Visit: Payer: Self-pay | Admitting: Surgery

## 2017-04-01 ENCOUNTER — Ambulatory Visit: Payer: Medicare Other | Admitting: Adult Health

## 2017-04-01 ENCOUNTER — Other Ambulatory Visit: Payer: Self-pay | Admitting: Surgery

## 2017-04-01 DIAGNOSIS — Z9889 Other specified postprocedural states: Secondary | ICD-10-CM | POA: Diagnosis not present

## 2017-04-01 DIAGNOSIS — Z853 Personal history of malignant neoplasm of breast: Secondary | ICD-10-CM | POA: Diagnosis not present

## 2017-04-01 NOTE — H&P (Signed)
[name] 04/01/2017 11:03 AM Location: [facility] Patient #: [id number] DOB: 1960/08/05 Single / Language: [name] / Race: Black or African American Female  History of Present Illness [name] A. Alicia Conaway MD; 04/01/2017 2:16 PM) Patient words: Patient returns for follow-up of her stage I left breast cancer. She is finishing chemotherapy has 3 treatments left. She desires port removal once that is done. Overall, she is done well with her therapy. She does report some fatigue. She does have some breast discomfort. There's been no masses or sign of drainage.  The patient is a 56 year old female.   Allergies Malachy Moan, RMA; 04/01/2017 11:03 AM) OxyCODONE HCl *ANALGESICS - OPIOID* Propoxyphene Compound *ANALGESICS - OPIOID* Adhesive Tape Aspir-81 *ANALGESICS - NonNarcotic* PredniSONE *CORTICOSTEROIDS*  Medication History Malachy Moan, RMA; 04/01/2017 11:05 AM) Gabapentin (100MG  Capsule, Oral) Active. Lisinopril (20MG  Tablet, Oral) Active. Oxybutynin Chloride (5MG  Tablet, Oral) Active. Multivitamins/Minerals (Oral) Active. VESIcare (5MG  Tablet, Oral) Active. Robaxin (500MG  Tablet, Oral) Active. Mobic (7.5MG  Tablet, Oral) Active. Polyethylene Glycol 3350 (Oral) Active. Calcium Citrate (950MG  Tablet, Oral) Active. Fluticasone Propionate (50MCG/ACT Suspension, Nasal) Active. EMLA (2.5-2.5% Cream, External) Active. Silvadene (1% Cream, External) Active. SF 5000 Plus (1.1% Cream, Dental) Active. Letrozole (2.5MG  Tablet, Oral) Active. Medications Reconciled    Vitals Malachy Moan RMA; 04/01/2017 11:05 AM) 04/01/2017 11:05 AM Weight: 196 lb Height: 61in Body Surface Area: 1.87 m Body Mass Index: 37.03 kg/m  Temp.: 98.45F  Pulse: 61 (Regular)  BP: 122/82 (Sitting, Left Arm, Standard)      Physical Exam (Mehtab Dolberry A. Makiah Clauson MD; 04/01/2017 2:17 PM)  General Mental Status-Alert. General  Appearance-Consistent with stated age. Hydration-Well hydrated. Voice-Normal.  Head and Neck Head-normocephalic, atraumatic with no lesions or palpable masses. Trachea-midline. Thyroid Gland Characteristics - normal size and consistency.  Breast Note: Right sided port intact. Right breast is normal. Left breast shows postsurgical changes. No masses, drainage or signs of infection or seroma.  Neurologic Neurologic evaluation reveals -alert and oriented x 3 with no impairment of recent or remote memory. Mental Status-Normal.  Lymphatic Head & Neck  General Head & Neck Lymphatics: Bilateral - Description - Normal. Axillary  General Axillary Region: Bilateral - Description - Normal. Tenderness - Non Tender.    Assessment & Plan (Francy Mcilvaine A. Carlotta Telfair MD; 04/01/2017 2:18 PM)  HISTORY OF BREAST CANCER (Z85.3) Impression: Stable  once done with chemotherapy          Pt requires port placement for chemotherapy. Risk include bleeding, infection, pneumothorax, hemothorax, mediastinal injury, nerve injury , blood vessel injury, strke, blood clots, death, migration. embolization and need for additional procedures. Pt agrees to proceed. Return in 6 months for routine follow-up  POST-OPERATIVE STATE [phone]) Impression: port in place pt desires removal after chemotherapy is done  Current Plans I recommended surgery to remove the catheter. I explained the technique of removal with use of local anesthesia & possible need for more aggressive sedation/anesthesia for patient comfort.  Risks such as bleeding, infection, and other risks were discussed. Post-operative dressing/incision care was discussed. I noted a good likelihood this will help address the problem. We will work to minimize complications. Questions were answered. The patient expresses understanding & wishes to proceed with surgery.  Pt Education - [facility] Free Text Education/Instructions: discussed with  patient and provided information.

## 2017-04-05 ENCOUNTER — Other Ambulatory Visit (HOSPITAL_BASED_OUTPATIENT_CLINIC_OR_DEPARTMENT_OTHER): Payer: Medicare Other

## 2017-04-05 ENCOUNTER — Ambulatory Visit: Payer: Medicare Other

## 2017-04-05 ENCOUNTER — Other Ambulatory Visit: Payer: Self-pay

## 2017-04-05 ENCOUNTER — Ambulatory Visit (HOSPITAL_BASED_OUTPATIENT_CLINIC_OR_DEPARTMENT_OTHER): Payer: Medicare Other

## 2017-04-05 VITALS — BP 134/80 | HR 73 | Temp 98.8°F | Resp 18

## 2017-04-05 DIAGNOSIS — Z170 Estrogen receptor positive status [ER+]: Secondary | ICD-10-CM

## 2017-04-05 DIAGNOSIS — C50512 Malignant neoplasm of lower-outer quadrant of left female breast: Secondary | ICD-10-CM

## 2017-04-05 DIAGNOSIS — Z5111 Encounter for antineoplastic chemotherapy: Secondary | ICD-10-CM

## 2017-04-05 DIAGNOSIS — Z95828 Presence of other vascular implants and grafts: Secondary | ICD-10-CM

## 2017-04-05 LAB — CBC WITH DIFFERENTIAL/PLATELET
BASO%: 0.3 % (ref 0.0–2.0)
Basophils Absolute: 0 10*3/uL (ref 0.0–0.1)
EOS%: 2.3 % (ref 0.0–7.0)
Eosinophils Absolute: 0.1 10*3/uL (ref 0.0–0.5)
HCT: 32.4 % — ABNORMAL LOW (ref 34.8–46.6)
HGB: 10.6 g/dL — ABNORMAL LOW (ref 11.6–15.9)
LYMPH%: 27.7 % (ref 14.0–49.7)
MCH: 31.8 pg (ref 25.1–34.0)
MCHC: 32.7 g/dL (ref 31.5–36.0)
MCV: 97.3 fL (ref 79.5–101.0)
MONO#: 0.3 10*3/uL (ref 0.1–0.9)
MONO%: 10.3 % (ref 0.0–14.0)
NEUT#: 1.8 10*3/uL (ref 1.5–6.5)
NEUT%: 59.4 % (ref 38.4–76.8)
Platelets: 190 10*3/uL (ref 145–400)
RBC: 3.33 10*6/uL — ABNORMAL LOW (ref 3.70–5.45)
RDW: 14.5 % (ref 11.2–14.5)
WBC: 3.1 10*3/uL — ABNORMAL LOW (ref 3.9–10.3)
lymph#: 0.9 10*3/uL (ref 0.9–3.3)

## 2017-04-05 LAB — COMPREHENSIVE METABOLIC PANEL
ALT: 13 U/L (ref 0–55)
AST: 13 U/L (ref 5–34)
Albumin: 3.9 g/dL (ref 3.5–5.0)
Alkaline Phosphatase: 54 U/L (ref 40–150)
Anion Gap: 8 mEq/L (ref 3–11)
BUN: 17.3 mg/dL (ref 7.0–26.0)
CO2: 24 mEq/L (ref 22–29)
Calcium: 9.3 mg/dL (ref 8.4–10.4)
Chloride: 105 mEq/L (ref 98–109)
Creatinine: 0.7 mg/dL (ref 0.6–1.1)
EGFR: >60 mL/min/{1.73_m2} (ref >60)
Glucose: 84 mg/dl (ref 70–140)
Potassium: 4 mEq/L (ref 3.5–5.1)
Sodium: 137 mEq/L (ref 136–145)
Total Bilirubin: 0.39 mg/dL (ref 0.20–1.20)
Total Protein: 6.9 g/dL (ref 6.4–8.3)

## 2017-04-05 MED ORDER — SODIUM CHLORIDE 0.9 % IV SOLN
45.0000 mg/m2 | Freq: Once | INTRAVENOUS | Status: AC
  Administered 2017-04-05: 90 mg via INTRAVENOUS
  Filled 2017-04-05: qty 15

## 2017-04-05 MED ORDER — SODIUM CHLORIDE 0.9% FLUSH
10.0000 mL | INTRAVENOUS | Status: DC | PRN
  Administered 2017-04-05: 10 mL
  Filled 2017-04-05: qty 10

## 2017-04-05 MED ORDER — FAMOTIDINE IN NACL 20-0.9 MG/50ML-% IV SOLN
INTRAVENOUS | Status: AC
  Filled 2017-04-05: qty 50

## 2017-04-05 MED ORDER — SODIUM CHLORIDE 0.9 % IV SOLN
20.0000 mg | Freq: Once | INTRAVENOUS | Status: AC
  Administered 2017-04-05: 20 mg via INTRAVENOUS
  Filled 2017-04-05: qty 2

## 2017-04-05 MED ORDER — DIPHENHYDRAMINE HCL 50 MG/ML IJ SOLN
INTRAMUSCULAR | Status: AC
  Filled 2017-04-05: qty 1

## 2017-04-05 MED ORDER — SODIUM CHLORIDE 0.9% FLUSH
10.0000 mL | Freq: Once | INTRAVENOUS | Status: AC
  Administered 2017-04-05: 10 mL
  Filled 2017-04-05: qty 10

## 2017-04-05 MED ORDER — FAMOTIDINE IN NACL 20-0.9 MG/50ML-% IV SOLN
20.0000 mg | Freq: Once | INTRAVENOUS | Status: AC
  Administered 2017-04-05: 20 mg via INTRAVENOUS

## 2017-04-05 MED ORDER — MAGIC MOUTHWASH W/LIDOCAINE: 5.0000 mL | Freq: Three times a day (TID) | ORAL | 0 refills | Status: DC

## 2017-04-05 MED ORDER — SODIUM CHLORIDE 0.9 % IV SOLN
Freq: Once | INTRAVENOUS | Status: AC
  Administered 2017-04-05: 11:00:00 via INTRAVENOUS

## 2017-04-05 MED ORDER — DIPHENHYDRAMINE HCL 50 MG/ML IJ SOLN
25.0000 mg | Freq: Once | INTRAMUSCULAR | Status: AC
  Administered 2017-04-05: 25 mg via INTRAVENOUS

## 2017-04-05 MED ORDER — HEPARIN SOD (PORK) LOCK FLUSH 100 UNIT/ML IV SOLN
500.0000 [IU] | Freq: Once | INTRAVENOUS | Status: AC | PRN
  Administered 2017-04-05: 500 [IU]
  Filled 2017-04-05: qty 5

## 2017-04-05 MED FILL — MAGIC MOUTHWASH W/LIDO 1:1: 13 days supply | Qty: 200 | Fill #0

## 2017-04-05 NOTE — Patient Instructions (Signed)
[facility] Discharge Instructions for Patients Receiving Chemotherapy  Today you received the following chemotherapy agents:  Taxol.  To help prevent nausea and vomiting after your treatment, we encourage you to take your nausea medication as directed.   If you develop nausea and vomiting that is not controlled by your nausea medication, call the clinic.   BELOW ARE SYMPTOMS THAT SHOULD BE REPORTED IMMEDIATELY:  *FEVER GREATER THAN 100.5 F  *CHILLS WITH OR WITHOUT FEVER  NAUSEA AND VOMITING THAT IS NOT CONTROLLED WITH YOUR NAUSEA MEDICATION  *UNUSUAL SHORTNESS OF BREATH  *UNUSUAL BRUISING OR BLEEDING  TENDERNESS IN MOUTH AND THROAT WITH OR WITHOUT PRESENCE OF ULCERS  *URINARY PROBLEMS  *BOWEL PROBLEMS  UNUSUAL RASH Items with * indicate a potential emergency and should be followed up as soon as possible.  Feel free to call the clinic should you have any questions or concerns. The clinic phone number is [phone].  Please show the CHEMO ALERT CARD at check-in to the Emergency Department and triage nurse.   

## 2017-04-05 NOTE — Progress Notes (Signed)
Pt complains of burning in her mouth and has not been able to tolerate eating and drinking much. Pt does not have oral ulcers at this time, but oral mucosa lining looks reddened/irritated, per Infusion nurse, Ronaldo Miyamoto. Per Dr.Gudena, okay to send prescription for magic mouthwash to [facility] today.

## 2017-04-09 DIAGNOSIS — N3281 Overactive bladder: Secondary | ICD-10-CM | POA: Diagnosis not present

## 2017-04-09 DIAGNOSIS — E7800 Pure hypercholesterolemia, unspecified: Secondary | ICD-10-CM | POA: Diagnosis not present

## 2017-04-09 DIAGNOSIS — M797 Fibromyalgia: Secondary | ICD-10-CM | POA: Diagnosis not present

## 2017-04-09 DIAGNOSIS — I10 Essential (primary) hypertension: Secondary | ICD-10-CM | POA: Diagnosis not present

## 2017-04-12 ENCOUNTER — Telehealth: Payer: Self-pay | Admitting: Hematology and Oncology

## 2017-04-12 ENCOUNTER — Ambulatory Visit (HOSPITAL_BASED_OUTPATIENT_CLINIC_OR_DEPARTMENT_OTHER): Payer: Medicare Other | Admitting: Adult Health

## 2017-04-12 ENCOUNTER — Ambulatory Visit: Payer: Medicare Other

## 2017-04-12 ENCOUNTER — Other Ambulatory Visit (HOSPITAL_BASED_OUTPATIENT_CLINIC_OR_DEPARTMENT_OTHER): Payer: Medicare Other

## 2017-04-12 ENCOUNTER — Encounter: Payer: Self-pay | Admitting: Adult Health

## 2017-04-12 ENCOUNTER — Other Ambulatory Visit: Payer: Self-pay | Admitting: Adult Health

## 2017-04-12 ENCOUNTER — Ambulatory Visit (HOSPITAL_BASED_OUTPATIENT_CLINIC_OR_DEPARTMENT_OTHER): Payer: Medicare Other

## 2017-04-12 VITALS — BP 138/73 | HR 77 | Temp 98.0°F | Resp 20 | Ht 61.0 in | Wt 197.6 lb

## 2017-04-12 DIAGNOSIS — Z170 Estrogen receptor positive status [ER+]: Secondary | ICD-10-CM

## 2017-04-12 DIAGNOSIS — R350 Frequency of micturition: Secondary | ICD-10-CM

## 2017-04-12 DIAGNOSIS — C50512 Malignant neoplasm of lower-outer quadrant of left female breast: Secondary | ICD-10-CM

## 2017-04-12 DIAGNOSIS — Z95828 Presence of other vascular implants and grafts: Secondary | ICD-10-CM

## 2017-04-12 DIAGNOSIS — Z5111 Encounter for antineoplastic chemotherapy: Secondary | ICD-10-CM | POA: Diagnosis not present

## 2017-04-12 LAB — COMPREHENSIVE METABOLIC PANEL
ALT: 12 U/L (ref 0–55)
AST: 14 U/L (ref 5–34)
Albumin: 3.7 g/dL (ref 3.5–5.0)
Alkaline Phosphatase: 49 U/L (ref 40–150)
Anion Gap: 7 mEq/L (ref 3–11)
BUN: 8.5 mg/dL (ref 7.0–26.0)
CO2: 25 mEq/L (ref 22–29)
Calcium: 8.9 mg/dL (ref 8.4–10.4)
Chloride: 105 mEq/L (ref 98–109)
Creatinine: 0.7 mg/dL (ref 0.6–1.1)
EGFR: >60 mL/min/{1.73_m2} (ref >60)
Glucose: 121 mg/dl (ref 70–140)
Potassium: 4.1 mEq/L (ref 3.5–5.1)
Sodium: 137 mEq/L (ref 136–145)
Total Bilirubin: 0.38 mg/dL (ref 0.20–1.20)
Total Protein: 6.5 g/dL (ref 6.4–8.3)

## 2017-04-12 LAB — CBC WITH DIFFERENTIAL/PLATELET
BASO%: 0.6 % (ref 0.0–2.0)
Basophils Absolute: 0 10*3/uL (ref 0.0–0.1)
EOS%: 2.4 % (ref 0.0–7.0)
Eosinophils Absolute: 0.1 10*3/uL (ref 0.0–0.5)
HCT: 31.5 % — ABNORMAL LOW (ref 34.8–46.6)
HGB: 10.4 g/dL — ABNORMAL LOW (ref 11.6–15.9)
LYMPH%: 27.1 % (ref 14.0–49.7)
MCH: 32.1 pg (ref 25.1–34.0)
MCHC: 33 g/dL (ref 31.5–36.0)
MCV: 97.2 fL (ref 79.5–101.0)
MONO#: 0.2 10*3/uL (ref 0.1–0.9)
MONO%: 6.1 % (ref 0.0–14.0)
NEUT#: 2.1 10*3/uL (ref 1.5–6.5)
NEUT%: 63.8 % (ref 38.4–76.8)
Platelets: 177 10*3/uL (ref 145–400)
RBC: 3.24 10*6/uL — ABNORMAL LOW (ref 3.70–5.45)
RDW: 14.8 % — ABNORMAL HIGH (ref 11.2–14.5)
WBC: 3.3 10*3/uL — ABNORMAL LOW (ref 3.9–10.3)
lymph#: 0.9 10*3/uL (ref 0.9–3.3)
nRBC: 0 % (ref 0–0)

## 2017-04-12 MED ORDER — SODIUM CHLORIDE 0.9 % IV SOLN
20.0000 mg | Freq: Once | INTRAVENOUS | Status: AC
  Administered 2017-04-12: 20 mg via INTRAVENOUS
  Filled 2017-04-12: qty 2

## 2017-04-12 MED ORDER — FAMOTIDINE IN NACL 20-0.9 MG/50ML-% IV SOLN
20.0000 mg | Freq: Once | INTRAVENOUS | Status: AC
  Administered 2017-04-12: 20 mg via INTRAVENOUS

## 2017-04-12 MED ORDER — SODIUM CHLORIDE 0.9 % IV SOLN
45.0000 mg/m2 | Freq: Once | INTRAVENOUS | Status: AC
  Administered 2017-04-12: 90 mg via INTRAVENOUS
  Filled 2017-04-12: qty 15

## 2017-04-12 MED ORDER — SODIUM CHLORIDE 0.9 % IV SOLN
Freq: Once | INTRAVENOUS | Status: AC
  Administered 2017-04-12: 12:00:00 via INTRAVENOUS

## 2017-04-12 MED ORDER — DIPHENHYDRAMINE HCL 50 MG/ML IJ SOLN
INTRAMUSCULAR | Status: AC
  Filled 2017-04-12: qty 1

## 2017-04-12 MED ORDER — SODIUM CHLORIDE 0.9% FLUSH
10.0000 mL | Freq: Once | INTRAVENOUS | Status: AC
  Administered 2017-04-12: 10 mL
  Filled 2017-04-12: qty 10

## 2017-04-12 MED ORDER — DIPHENHYDRAMINE HCL 50 MG/ML IJ SOLN
25.0000 mg | Freq: Once | INTRAMUSCULAR | Status: AC
  Administered 2017-04-12: 25 mg via INTRAVENOUS

## 2017-04-12 MED ORDER — FAMOTIDINE IN NACL 20-0.9 MG/50ML-% IV SOLN
INTRAVENOUS | Status: AC
  Filled 2017-04-12: qty 50

## 2017-04-12 MED ORDER — HEPARIN SOD (PORK) LOCK FLUSH 100 UNIT/ML IV SOLN
500.0000 [IU] | Freq: Once | INTRAVENOUS | Status: AC | PRN
  Administered 2017-04-12: 500 [IU]
  Filled 2017-04-12: qty 5

## 2017-04-12 MED ORDER — SODIUM CHLORIDE 0.9% FLUSH
10.0000 mL | INTRAVENOUS | Status: DC | PRN
  Administered 2017-04-12: 10 mL
  Filled 2017-04-12: qty 10

## 2017-04-12 NOTE — Telephone Encounter (Signed)
Called patient regarding current schedule for 11/30 per Tiffany Dr will see patient in the infusion area.

## 2017-04-12 NOTE — Progress Notes (Signed)
[facility] Follow up:    Alicia Frees, MD [address] [place] [address]   DIAGNOSIS: Cancer Staging Malignant neoplasm of lower-outer quadrant of left breast of female, estrogen receptor positive ([place]) Staging form: Breast, AJCC 8th Edition - Clinical: Stage IA (cT1c, cN0(sn), cM0, G3, ER: Positive, PR: Positive, HER2: Negative, Oncotype DX score: 51) - Unsigned   SUMMARY OF ONCOLOGIC HISTORY:   Malignant neoplasm of lower-outer quadrant of left breast of female, estrogen receptor positive ([place])   06/26/2016 Initial Diagnosis    Left breast biopsy 3:00: IDC with DCIS, grade 3, ER 80%, PR 20%, Ki-67 60%, HER-2 negative ratio 1.18; palpable lump: 1.8 cm lesion, no lymph nodes, T1 cN0 stage I a clinical stage      07/26/2016 Surgery    Left lumpectomy: IDC 1.8 cm, with DCIS, margins negative, 0/2 lymph nodes, ER 80%, PR 20%, HER-2 negative ratio 1.18, Ki-67 60%, T1cN0 stage IA       08/21/2016 Oncotype testing    Oncotype DX recurrence score 51, risk of recurrence 34%      08/21/2016 Genetic Testing    Genetic counseling and testing for hereditary cancer syndromes performed on 08/21/2016. Results are negative for pathogenic mutations in 46 genes analyzed by [facility]'s Common Hereditary Cancers Panel. Results are dated 09/14/2016. Genes tested: APC, ATM, AXIN2, BARD1, BMPR1A, BRCA1, BRCA2, BRIP1, CDH1, CDKN2A, CHEK2, CTNNA1, DICER1, EPCAM, GREM1, HOXB13, KIT, MEN1, MLH1, MSH2, MSH3, MSH6, MUTYH, NBN, NF1, NTHL1, PALB2, PDGFRA, PMS2, POLD1, POLE, PTEN, RAD50, RAD51C, RAD51D, SDHA, SDHB, SDHC, SDHD, SMAD4, SMARCA4, STK11, TP53, TSC1, TSC2, and VHL.        11/02/2016 -  Chemotherapy    Dose dense Adriamycin and Cytoxan 4 followed by Taxol weekly 12       CURRENT THERAPY: Taxol week 11  INTERVAL HISTORY: [name] 56 y.o. female returns for evaluation prior to receiving her weekly Taxol.  She continues to tolerate it well.  She has mild  intermittent peripheral neuropathy in her toes, and denies any in her fingers.  This does not affect her ability to walk.  She is doing well otherwise.     Patient Active Problem List   Diagnosis Date Noted  . Chemotherapy-induced peripheral neuropathy ([place]) 02/15/2017  . Port catheter in place 11/02/2016  . Genetic testing 09/19/2016  . Pre-operative clearance 07/23/2016  . Malignant neoplasm of lower-outer quadrant of left breast of female, estrogen receptor positive ([place]) 07/10/2016  . Essential hypertension   . Chest pain 09/30/2015  . DVT, lower extremity ([place]) 06/18/2011  . DVT of upper extremity (deep vein thrombosis) ([place]) 06/18/2011  . [place] filter in place 06/18/2011  . History of stroke 06/18/2011  . PFO (patent foramen ovale) 06/18/2011  . Status post gastric bypass for obesity 06/18/2011  . PELVIC PAIN, CHRONIC 07/20/2008  . FIBROIDS, UTERUS 07/16/2008  . ASTHMA 07/16/2008  . FIBROMYALGIA 07/16/2008  . CARPAL TUNNEL SYNDROME, HX OF 07/16/2008  . History of pulmonary embolus (PE) 07/16/2008    is allergic to aspirin; oxycodone hcl; propoxyphene n-acetaminophen; tramadol; adhesive [tape]; and prednisone.  MEDICAL HISTORY: Past Medical History:  Diagnosis Date  . Anemia    since bypass  . Arthritis    osteoarthritis  . Asthma    states no asthma attack since 2002  . Breast cancer ([place]) 06/26/16 bx   left breast  . Chronic back pain   . Complication of anesthesia    states takes more than normal to  put her to sleep  . Dental bridge present    upper  . Dental crowns present   . DVT of upper extremity (deep vein thrombosis) ([place])   . Fibromyalgia   . Genetic testing 09/19/2016   [name] underwent genetic counseling and testing for hereditary cancer syndromes on 08/21/2016. Her results were negative for mutations in all 46 genes analyzed by [facility]'s 46-gene Common Hereditary Cancers Panel. Genes analyzed include: APC, ATM, AXIN2, BARD1, BMPR1A, BRCA1, BRCA2,  BRIP1, CDH1, CDKN2A, CHEK2, CTNNA1, DICER1, EPCAM, GREM1, HOXB13, KIT, MEN1, MLH1, MSH2, MSH3, MSH6, MUTYH, NBN,   . Headache(784.0)    migraines  . History of blood transfusion 06/2005  . History of gallstones   . History of pneumonia   . History of shingles 07/2011  . HTN (hypertension)   . Normal coronary arteries 2003  . PFO (patent foramen ovale)   . Presence of inferior vena cava filter   . Pulmonary embolism ([place])   . Sleep apnea    used CPAP until after bypass surg.  . Status post gastric bypass for obesity   . Stroke [facility]) 12/2002   right-sided weakness  . Urinary incontinence     SURGICAL HISTORY: Past Surgical History:  Procedure Laterality Date  . ABDOMINAL HYSTERECTOMY  11/1997   complete  . ANTERIOR CERVICAL DECOMP/DISCECTOMY FUSION  02/05/2005   C5-6  . APPENDECTOMY  10/22/2008   laparoscopic  . BREAST LUMPECTOMY WITH RADIOACTIVE SEED AND SENTINEL LYMPH NODE BIOPSY Left 07/26/2016   Procedure: BREAST LUMPECTOMY WITH RADIOACTIVE SEED AND SENTINEL LYMPH NODE BIOPSY;  Surgeon: Erroll Luna, MD;  Location: [facility];  Service: General;  Laterality: Left;  . BUNIONECTOMY  05/1980   both feet  . BUNIONECTOMY  08/2011   left foot  . CARDIAC CATHETERIZATION  03/04/2002  . CARPAL TUNNEL RELEASE  06/21/2009   right  . CARPAL TUNNEL RELEASE     left hand  . CARPAL TUNNEL RELEASE  10/03/2011   Procedure: CARPAL TUNNEL RELEASE;  Surgeon: Wynonia Sours, MD;  Location: [facility];  Service: Orthopedics;  Laterality: Right;  CARPAL TUNNEL WITH HYPOTHENAR FAT PAD TRANSFER  . CERVICAL SPINE SURGERY  01/2005   titanium plate implanted  . CHOLECYSTECTOMY  1990  . ELBOW SURGERY  08/09/2004   decompression ulnar nerve right elbow  . ENTEROLYSIS  10/22/2008   laparoscopic abd. enterolysis  . GASTRIC ROUX-EN-Y  2009  . HEEL SPUR SURGERY  08/1997   left  . HEMORRHOID SURGERY  03/1993  . LAPAROSCOPIC LYSIS INTESTINAL ADHESIONS  02/14/2000  . NAILBED REPAIR   01/10/2005; 08/2011   exc. matrix bilat. great toe  . OTHER SURGICAL HISTORY  12/1986   pt states that she had surgery to unclog her fallopean tubes  . PORTACATH PLACEMENT N/A 09/24/2016   Procedure: INSERTION PORT-A-CATH WITH [name];  Surgeon: Erroll Luna, MD;  Location: [facility];  Service: General;  Laterality: N/A;  . SHOULDER SURGERY     bilat. - (left:  06/2005)  . TONSILLECTOMY  07/1995  . TRIGGER FINGER RELEASE  04/25/2006   decompression A-1 pulley left thumb  . UTERINE FIBROID SURGERY  12/95, 7/96   x2  . VENA CAVA FILTER PLACEMENT  2009   during Roux-en-Y surg.    SOCIAL HISTORY: Social History   Socioeconomic History  . Marital status: Single    Spouse name: Not on file  . Number of children: 0  . Years of education: Not on file  . Highest  education level: Not on file  Social Needs  . Financial resource strain: Not on file  . Food insecurity - worry: Not on file  . Food insecurity - inability: Not on file  . Transportation needs - medical: Not on file  . Transportation needs - non-medical: Not on file  Occupational History  . Occupation: disabled  Tobacco Use  . Smoking status: Former Research scientist (life sciences)  . Smokeless tobacco: Never Used  . Tobacco comment: quit smoking 08/1989  Substance and Sexual Activity  . Alcohol use: No  . Drug use: No  . Sexual activity: No    Birth control/protection: Surgical  Other Topics Concern  . Not on file  Social History Narrative  . Not on file    FAMILY HISTORY: Family History  Problem Relation Age of Onset  . Breast cancer Paternal Aunt 20  . Cervical cancer Paternal Grandmother 48       d.40s  . Ovarian cancer Maternal Grandmother 23       d.23  . Colon polyps Father   . Diabetes Father        borderline  . Prostate cancer Father   . Hypertension Mother   . Hypertension Unknown   . Breast cancer Sister 72       treated with neoadjuvant chemo/radiation and lumpectomy    Review of Systems  Constitutional: Negative for appetite  change, chills, fatigue, fever and unexpected weight change.  HENT:   Negative for hearing loss, lump/mass, sore throat and voice change.   Eyes: Negative for eye problems and icterus.  Respiratory: Negative for chest tightness, cough and shortness of breath.   Cardiovascular: Negative for chest pain, leg swelling and palpitations.  Gastrointestinal: Negative for abdominal distention and abdominal pain.  Endocrine: Negative for hot flashes.  Genitourinary: Negative for difficulty urinating.   Musculoskeletal: Positive for arthralgias.  Skin: Negative for itching and rash.  Neurological: Negative for dizziness, extremity weakness, headaches and light-headedness.  Hematological: Negative for adenopathy. Does not bruise/bleed easily.  Psychiatric/Behavioral: Negative for depression. The patient is not nervous/anxious.       PHYSICAL EXAMINATION  ECOG PERFORMANCE STATUS: 1 - Symptomatic but completely ambulatory  Vitals:   04/12/17 0914  BP: 138/73  Pulse: 77  Resp: 20  Temp: 98 F (36.7 C)  SpO2: 100%    Physical Exam  Constitutional: She is oriented to person, place, and time and well-developed, well-nourished, and in no distress.  HENT:  Head: Normocephalic and atraumatic.  Mouth/Throat: Oropharynx is clear and moist. No oropharyngeal exudate.  Eyes: Pupils are equal, round, and reactive to light. No scleral icterus.  Neck: Neck supple.  Cardiovascular: Normal rate, regular rhythm and normal heart sounds.  Pulmonary/Chest: Effort normal and breath sounds normal.  Abdominal: Soft. Bowel sounds are normal. She exhibits no distension and no mass. There is no tenderness. There is no rebound and no guarding.  Musculoskeletal: She exhibits no edema.  Lymphadenopathy:    She has no cervical adenopathy.  Neurological: She is alert and oriented to person, place, and time.  Skin: Skin is warm and dry. No rash noted.  Psychiatric: Mood and affect normal.    LABORATORY  DATA:  CBC    Component Value Date/Time   WBC 3.3 (L) 04/12/2017 0841   WBC 7.5 12/14/2016 0120   RBC 3.24 (L) 04/12/2017 0841   RBC 3.13 (L) 12/14/2016 0120   HGB 10.4 (L) 04/12/2017 0841   HCT 31.5 (L) 04/12/2017 0841   PLT 177 04/12/2017 0841  MCV 97.2 04/12/2017 0841   MCH 32.1 04/12/2017 0841   MCH 31.0 12/14/2016 0120   MCHC 33.0 04/12/2017 0841   MCHC 33.9 12/14/2016 0120   RDW 14.8 (H) 04/12/2017 0841   LYMPHSABS 0.9 04/12/2017 0841   MONOABS 0.2 04/12/2017 0841   EOSABS 0.1 04/12/2017 0841   BASOSABS 0.0 04/12/2017 0841    CMP     Component Value Date/Time   NA 137 04/12/2017 0840   K 4.1 04/12/2017 0840   CL 104 12/14/2016 0120   CO2 25 04/12/2017 0840   GLUCOSE 121 04/12/2017 0840   BUN 8.5 04/12/2017 0840   CREATININE 0.7 04/12/2017 0840   CALCIUM 8.9 04/12/2017 0840   PROT 6.5 04/12/2017 0840   ALBUMIN 3.7 04/12/2017 0840   AST 14 04/12/2017 0840   ALT 12 04/12/2017 0840   ALKPHOS 49 04/12/2017 0840   BILITOT 0.38 04/12/2017 0840   GFRNONAA >60 12/14/2016 0120   GFRAA >60 12/14/2016 0120      ASSESSMENT and PLAN:   Malignant neoplasm of lower-outer quadrant of left breast of female, estrogen receptor positive ([place]) 07/26/2016: Left lumpectomy: IDC 1.8 cm, with DCIS, margins negative, 0/2 lymph nodes, ER 80%, PR 20%, HER-2 negative ratio 1.18, Ki-67 60%, T1cN0 stage IA  Oncotype DX score 51: 10 year risk of recurrence greater than 34%, extremely high risk  Recommendation: 1. Adjuvant chemotherapy with Adriamycin/Cytoxan x 4, then weekly Taxol x 12 2. Adjuvant radiation therapy followed by 3. Adjuvant antiestrogen therapy -------------------------------------------------------------------------------------------------------------------------------- Current treatment: Cycle 11Taxol  [name] is doing well today.  She will proceed with Cycle 11 of Taxol today.  Her CBC is stable.  Her neuropathy in her toes, though still intermittently  present has not progressed, so she can continue her treatments.  Her Taxol has been dose reduced due to the neuropathy. She will return in one week for labs, f/u with Dr. Lindi Adie, and her final Taxol.   Return to clinic for labs/taxol weekly, with provider visits every other week.     All questions were answered. The patient knows to call the clinic with any problems, questions or concerns. We can certainly see the patient much sooner if necessary.  A total of (30) minutes of face-to-face time was spent with this patient with greater than 50% of that time in counseling and care-coordination.  This note was electronically signed. Scot Dock, NP 04/12/2017

## 2017-04-12 NOTE — Telephone Encounter (Signed)
Patient declined avs. Waiting to here from cindy regarding 11/30

## 2017-04-12 NOTE — Patient Instructions (Signed)
[facility] Discharge Instructions for Patients Receiving Chemotherapy  Today you received the following chemotherapy agents :  Taxol.  To help prevent nausea and vomiting after your treatment, we encourage you to take your nausea medication as prescribed.   If you develop nausea and vomiting that is not controlled by your nausea medication, call the clinic.   BELOW ARE SYMPTOMS THAT SHOULD BE REPORTED IMMEDIATELY:  *FEVER GREATER THAN 100.5 F  *CHILLS WITH OR WITHOUT FEVER  NAUSEA AND VOMITING THAT IS NOT CONTROLLED WITH YOUR NAUSEA MEDICATION  *UNUSUAL SHORTNESS OF BREATH  *UNUSUAL BRUISING OR BLEEDING  TENDERNESS IN MOUTH AND THROAT WITH OR WITHOUT PRESENCE OF ULCERS  *URINARY PROBLEMS  *BOWEL PROBLEMS  UNUSUAL RASH Items with * indicate a potential emergency and should be followed up as soon as possible.  Feel free to call the clinic should you have any questions or concerns. The clinic phone number is [phone].  Please show the CHEMO ALERT CARD at check-in to the Emergency Department and triage nurse.   

## 2017-04-12 NOTE — Assessment & Plan Note (Addendum)
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 Adjuvant chemotherapy with Adriamycin/Cytoxan x 4, then weekly Taxol x 12 2. Adjuvant radiation therapy followed by 3. Adjuvant antiestrogen therapy -------------------------------------------------------------------------------------------------------------------------------- Current treatment: Cycle 11Taxol  [name] is doing well today.  She will proceed with Cycle 11 of Taxol today.  Her CBC is stable.  Her neuropathy in her toes, though still intermittently present has not progressed, so she can continue her treatments.  Her Taxol has been dose reduced due to the neuropathy. She will return in one week for labs, f/u with Dr. Lindi Adie, and her final Taxol.   Return to clinic for labs/taxol weekly, with provider visits every other week.

## 2017-04-15 MED FILL — CYCLOBENZAPRINE 5 MG TABLET: 5 | 30 days supply | Qty: 90 | Fill #0

## 2017-04-15 MED FILL — LISINOPRIL 20 MG TABLET: 20 | 90 days supply | Qty: 90 | Fill #0

## 2017-04-15 MED FILL — OXYBUTYNIN 5 MG TABLET: 5 | 30 days supply | Qty: 90 | Fill #0

## 2017-04-17 ENCOUNTER — Encounter: Payer: Self-pay | Admitting: Radiation Oncology

## 2017-04-18 ENCOUNTER — Other Ambulatory Visit: Payer: Self-pay | Admitting: *Deleted

## 2017-04-18 DIAGNOSIS — C50512 Malignant neoplasm of lower-outer quadrant of left female breast: Secondary | ICD-10-CM

## 2017-04-18 DIAGNOSIS — Z170 Estrogen receptor positive status [ER+]: Secondary | ICD-10-CM

## 2017-04-19 ENCOUNTER — Other Ambulatory Visit (HOSPITAL_BASED_OUTPATIENT_CLINIC_OR_DEPARTMENT_OTHER): Payer: Medicare Other

## 2017-04-19 ENCOUNTER — Ambulatory Visit (HOSPITAL_BASED_OUTPATIENT_CLINIC_OR_DEPARTMENT_OTHER): Payer: Medicare Other

## 2017-04-19 ENCOUNTER — Ambulatory Visit: Payer: Medicare Other

## 2017-04-19 ENCOUNTER — Ambulatory Visit (HOSPITAL_BASED_OUTPATIENT_CLINIC_OR_DEPARTMENT_OTHER): Payer: Medicare Other | Admitting: Hematology and Oncology

## 2017-04-19 DIAGNOSIS — G629 Polyneuropathy, unspecified: Secondary | ICD-10-CM

## 2017-04-19 DIAGNOSIS — C50512 Malignant neoplasm of lower-outer quadrant of left female breast: Secondary | ICD-10-CM

## 2017-04-19 DIAGNOSIS — Z95828 Presence of other vascular implants and grafts: Secondary | ICD-10-CM

## 2017-04-19 DIAGNOSIS — Z170 Estrogen receptor positive status [ER+]: Secondary | ICD-10-CM

## 2017-04-19 DIAGNOSIS — Z5111 Encounter for antineoplastic chemotherapy: Secondary | ICD-10-CM | POA: Diagnosis not present

## 2017-04-19 LAB — CBC WITH DIFFERENTIAL/PLATELET
BASO%: 0.3 % (ref 0.0–2.0)
Basophils Absolute: 0 10*3/uL (ref 0.0–0.1)
EOS%: 2 % (ref 0.0–7.0)
Eosinophils Absolute: 0.1 10*3/uL (ref 0.0–0.5)
HCT: 31.1 % — ABNORMAL LOW (ref 34.8–46.6)
HGB: 10.2 g/dL — ABNORMAL LOW (ref 11.6–15.9)
LYMPH%: 27.3 % (ref 14.0–49.7)
MCH: 32 pg (ref 25.1–34.0)
MCHC: 32.8 g/dL (ref 31.5–36.0)
MCV: 97.5 fL (ref 79.5–101.0)
MONO#: 0.3 10*3/uL (ref 0.1–0.9)
MONO%: 8.6 % (ref 0.0–14.0)
NEUT#: 2.2 10*3/uL (ref 1.5–6.5)
NEUT%: 61.8 % (ref 38.4–76.8)
Platelets: 184 10*3/uL (ref 145–400)
RBC: 3.19 10*6/uL — ABNORMAL LOW (ref 3.70–5.45)
RDW: 15.3 % — ABNORMAL HIGH (ref 11.2–14.5)
WBC: 3.5 10*3/uL — ABNORMAL LOW (ref 3.9–10.3)
lymph#: 1 10*3/uL (ref 0.9–3.3)

## 2017-04-19 LAB — COMPREHENSIVE METABOLIC PANEL
ALT: 11 U/L (ref 0–55)
AST: 14 U/L (ref 5–34)
Albumin: 3.9 g/dL (ref 3.5–5.0)
Alkaline Phosphatase: 50 U/L (ref 40–150)
Anion Gap: 7 mEq/L (ref 3–11)
BUN: 12 mg/dL (ref 7.0–26.0)
CO2: 25 mEq/L (ref 22–29)
Calcium: 8.9 mg/dL (ref 8.4–10.4)
Chloride: 105 mEq/L (ref 98–109)
Creatinine: 0.7 mg/dL (ref 0.6–1.1)
EGFR: >60 mL/min/{1.73_m2} (ref >60)
Glucose: 87 mg/dl (ref 70–140)
Potassium: 4 mEq/L (ref 3.5–5.1)
Sodium: 138 mEq/L (ref 136–145)
Total Bilirubin: 0.31 mg/dL (ref 0.20–1.20)
Total Protein: 6.7 g/dL (ref 6.4–8.3)

## 2017-04-19 MED ORDER — HEPARIN SOD (PORK) LOCK FLUSH 100 UNIT/ML IV SOLN
500.0000 [IU] | Freq: Once | INTRAVENOUS | Status: DC
  Filled 2017-04-19: qty 5

## 2017-04-19 MED ORDER — DIPHENHYDRAMINE HCL 50 MG/ML IJ SOLN
INTRAMUSCULAR | Status: AC
  Filled 2017-04-19: qty 1

## 2017-04-19 MED ORDER — FAMOTIDINE IN NACL 20-0.9 MG/50ML-% IV SOLN
20.0000 mg | Freq: Once | INTRAVENOUS | Status: AC
  Administered 2017-04-19: 20 mg via INTRAVENOUS

## 2017-04-19 MED ORDER — DIPHENHYDRAMINE HCL 50 MG/ML IJ SOLN
25.0000 mg | Freq: Once | INTRAMUSCULAR | Status: AC
  Administered 2017-04-19: 25 mg via INTRAVENOUS

## 2017-04-19 MED ORDER — SODIUM CHLORIDE 0.9% FLUSH
10.0000 mL | Freq: Once | INTRAVENOUS | Status: AC
  Administered 2017-04-19: 10 mL
  Filled 2017-04-19: qty 10

## 2017-04-19 MED ORDER — SODIUM CHLORIDE 0.9 % IV SOLN
20.0000 mg | Freq: Once | INTRAVENOUS | Status: AC
  Administered 2017-04-19: 20 mg via INTRAVENOUS
  Filled 2017-04-19: qty 2

## 2017-04-19 MED ORDER — SODIUM CHLORIDE 0.9 % IV SOLN
45.0000 mg/m2 | Freq: Once | INTRAVENOUS | Status: AC
  Administered 2017-04-19: 90 mg via INTRAVENOUS
  Filled 2017-04-19: qty 15

## 2017-04-19 MED ORDER — SODIUM CHLORIDE 0.9 % IV SOLN
Freq: Once | INTRAVENOUS | Status: AC
  Administered 2017-04-19: 12:00:00 via INTRAVENOUS

## 2017-04-19 MED ORDER — SODIUM CHLORIDE 0.9% FLUSH
10.0000 mL | INTRAVENOUS | Status: DC | PRN
  Administered 2017-04-19: 10 mL
  Filled 2017-04-19: qty 10

## 2017-04-19 MED ORDER — HEPARIN SOD (PORK) LOCK FLUSH 100 UNIT/ML IV SOLN
500.0000 [IU] | Freq: Once | INTRAVENOUS | Status: AC | PRN
  Administered 2017-04-19: 500 [IU]
  Filled 2017-04-19: qty 5

## 2017-04-19 MED ORDER — FAMOTIDINE IN NACL 20-0.9 MG/50ML-% IV SOLN
INTRAVENOUS | Status: AC
  Filled 2017-04-19: qty 50

## 2017-04-19 NOTE — Patient Instructions (Signed)
Implanted Port Home Guide An implanted port is a type of central line that is placed under the skin. Central lines are used to provide IV access when treatment or nutrition needs to be given through a person's veins. Implanted ports are used for long-term IV access. An implanted port may be placed because:  You need IV medicine that would be irritating to the small veins in your hands or arms.  You need long-term IV medicines, such as antibiotics.  You need IV nutrition for a long period.  You need frequent blood draws for lab tests.  You need dialysis.  Implanted ports are usually placed in the chest area, but they can also be placed in the upper arm, the abdomen, or the leg. An implanted port has two main parts:  Reservoir. The reservoir is round and will appear as a small, raised area under your skin. The reservoir is the part where a needle is inserted to give medicines or draw blood.  Catheter. The catheter is a thin, flexible tube that extends from the reservoir. The catheter is placed into a large vein. Medicine that is inserted into the reservoir goes into the catheter and then into the vein.  How will I care for my incision site? Do not get the incision site wet. Bathe or shower as directed by your health care provider. How is my port accessed? Special steps must be taken to access the port:  Before the port is accessed, a numbing cream can be placed on the skin. This helps numb the skin over the port site.  Your health care provider uses a sterile technique to access the port. ? Your health care provider must put on a mask and sterile gloves. ? The skin over your port is cleaned carefully with an antiseptic and allowed to dry. ? The port is gently pinched between sterile gloves, and a needle is inserted into the port.  Only "non-coring" port needles should be used to access the port. Once the port is accessed, a blood return should be checked. This helps ensure that the port  is in the vein and is not clogged.  If your port needs to remain accessed for a constant infusion, a clear (transparent) bandage will be placed over the needle site. The bandage and needle will need to be changed every week, or as directed by your health care provider.  Keep the bandage covering the needle clean and dry. Do not get it wet. Follow your health care provider's instructions on how to take a shower or bath while the port is accessed.  If your port does not need to stay accessed, no bandage is needed over the port.  What is flushing? Flushing helps keep the port from getting clogged. Follow your health care provider's instructions on how and when to flush the port. Ports are usually flushed with saline solution or a medicine called heparin. The need for flushing will depend on how the port is used.  If the port is used for intermittent medicines or blood draws, the port will need to be flushed: ? After medicines have been given. ? After blood has been drawn. ? As part of routine maintenance.  If a constant infusion is running, the port may not need to be flushed.  How long will my port stay implanted? The port can stay in for as long as your health care provider thinks it is needed. When it is time for the port to come out, surgery will be   done to remove it. The procedure is similar to the one performed when the port was put in. When should I seek immediate medical care? When you have an implanted port, you should seek immediate medical care if:  You notice a bad smell coming from the incision site.  You have swelling, redness, or drainage at the incision site.  You have more swelling or pain at the port site or the surrounding area.  You have a fever that is not controlled with medicine.  This information is not intended to replace advice given to you by your health care provider. Make sure you discuss any questions you have with your health care provider. Document  Released: 05/07/2005 Document Revised: 10/13/2015 Document Reviewed: 01/12/2013 Elsevier Interactive Patient Education  2017 [employer]  

## 2017-04-19 NOTE — Progress Notes (Signed)
Patient Care Team: [name], MD as PCP - General (Family Medicine) Nicholas Lose, MD as Consulting Physician (Hematology and Oncology)  DIAGNOSIS:  Encounter Diagnosis  Name Primary?  . Malignant neoplasm of lower-outer quadrant of left breast of female, estrogen receptor positive ([place])     SUMMARY OF ONCOLOGIC HISTORY:   Malignant neoplasm of lower-outer quadrant of left breast of female, estrogen receptor positive ([place])   06/26/2016 Initial Diagnosis    Left breast biopsy 3:00: IDC with DCIS, grade 3, ER 80%, PR 20%, Ki-67 60%, HER-2 negative ratio 1.18; palpable lump: 1.8 cm lesion, no lymph nodes, T1 cN0 stage I a clinical stage      07/26/2016 Surgery    Left lumpectomy: IDC 1.8 cm, with DCIS, margins negative, 0/2 lymph nodes, ER 80%, PR 20%, HER-2 negative ratio 1.18, Ki-67 60%, T1cN0 stage IA       08/21/2016 Oncotype testing    Oncotype DX recurrence score 51, risk of recurrence 34%      08/21/2016 Genetic Testing    Genetic counseling and testing for hereditary cancer syndromes performed on 08/21/2016. Results are negative for pathogenic mutations in 46 genes analyzed by [facility]'s Common Hereditary Cancers Panel. Results are dated 09/14/2016. Genes tested: APC, ATM, AXIN2, BARD1, BMPR1A, BRCA1, BRCA2, BRIP1, CDH1, CDKN2A, CHEK2, CTNNA1, DICER1, EPCAM, GREM1, HOXB13, KIT, MEN1, MLH1, MSH2, MSH3, MSH6, MUTYH, NBN, NF1, NTHL1, PALB2, PDGFRA, PMS2, POLD1, POLE, PTEN, RAD50, RAD51C, RAD51D, SDHA, SDHB, SDHC, SDHD, SMAD4, SMARCA4, STK11, TP53, TSC1, TSC2, and VHL.        11/02/2016 -  Chemotherapy    Dose dense Adriamycin and Cytoxan 4 followed by Taxol weekly 12       CHIEF COMPLIANT: Taxol No. 12  INTERVAL HISTORY: [name] is a 56 year old with above-mentioned history of left breast cancer treated with lumpectomy and is currently on adjuvant chemotherapy today is her last cycle of her chemo.  She has managed to finally finished her treatment in spite of the  profound fatigue that she develops after each treatment.  She has very poor endurance and energy levels.  She denies any neuropathy.  Denies any nausea or vomiting.  She does have some mouth sores for which she uses Magic mouthwash.  REVIEW OF SYSTEMS:   Constitutional: Severe fatigue Eyes: Denies blurriness of vision Ears, nose, mouth, throat, and face: Denies mucositis or sore throat Respiratory: Denies cough, dyspnea or wheezes Cardiovascular: Denies palpitation, chest discomfort Gastrointestinal:  Denies nausea, heartburn or change in bowel habits Skin: Denies abnormal skin rashes Lymphatics: Denies new lymphadenopathy or easy bruising Neurological:Denies numbness, tingling or new weaknesses Behavioral/Psych: Mood is stable, no new changes  Extremities: No lower extremity edema  All other systems were reviewed with the patient and are negative.  I have reviewed the past medical history, past surgical history, social history and family history with the patient and they are unchanged from previous note.  ALLERGIES:  is allergic to aspirin; oxycodone hcl; propoxyphene n-acetaminophen; tramadol; adhesive [tape]; and prednisone.  MEDICATIONS:  Current Outpatient Medications  Medication Sig Dispense Refill  . acetaminophen (TYLENOL) 500 MG tablet Take 1,000 mg by mouth every 6 (six) hours as needed (pain).    Marland Kitchen acetaminophen-codeine (TYLENOL #3) 300-30 MG tablet Take 1 tablet by mouth every 6 (six) hours as needed for moderate pain. 20 tablet 0  . albuterol (PROAIR HFA) 108 (90 Base) MCG/ACT inhaler Inhale 2 puffs into the lungs every 6 (six) hours as needed for wheezing or shortness of breath.    Marland Kitchen  Calcium Citrate-Vitamin D (CALCIUM CITRATE +D PO) Take 2 tablets by mouth daily. Calcium 600 mg     . cyclobenzaprine (FLEXERIL) 5 MG tablet Take 5 mg by mouth 3 (three) times daily as needed for muscle spasms.    . fluticasone (FLONASE) 50 MCG/ACT nasal spray Place 1 spray into both nostrils 2  (two) times daily. 16 g 5  . gabapentin (NEURONTIN) 300 MG capsule Take 300 mg by mouth 3 (three) times daily.    Marland Kitchen lisinopril (PRINIVIL,ZESTRIL) 20 MG tablet Take 20 mg by mouth daily.    . magic mouthwash w/lidocaine SOLN Take 5 mLs 3 (three) times daily by mouth. 5 mL 0  . mometasone (ELOCON) 0.1 % cream Apply 1 application topically daily as needed (rash - summer eczema). Apply to arms    . Multiple Vitamin (MULTIVITAMIN WITH MINERALS) TABS tablet Take 1 tablet by mouth at bedtime.    Marland Kitchen omeprazole (PRILOSEC) 40 MG capsule Take 1 capsule (40 mg total) by mouth daily. 30 capsule 5  . oxybutynin (DITROPAN) 5 MG tablet TAKE 1 TABLET (5 MG TOTAL) BY MOUTH 3 (THREE) TIMES DAILY. 90 tablet 0  . ranitidine (ZANTAC) 300 MG capsule Take 1 capsule (300 mg total) by mouth 2 (two) times daily. 60 capsule 3  . silver sulfADIAZINE (SILVADENE) 1 % cream Apply 1 application topically 2 (two) times daily as needed (stomach tears). 50 g 0  . sucralfate (CARAFATE) 1 g tablet Take 1 tablet (1 g total) by mouth 4 (four) times daily -  with meals and at bedtime. 120 tablet 0  . valACYclovir (VALTREX) 500 MG tablet Take 1 tablet by mouth as needed.  1   No current facility-administered medications for this visit.     PHYSICAL EXAMINATION: ECOG PERFORMANCE STATUS: 1 - Symptomatic but completely ambulatory  Vitals:   04/19/17 1055  BP: (!) 141/83  Pulse: 84  Resp: 18  Temp: 98.3 F (36.8 C)  SpO2: 100%   Filed Weights   04/19/17 1055  Weight: 201 lb 3.2 oz (91.3 kg)    GENERAL:alert, no distress and comfortable SKIN: skin color, texture, turgor are normal, no rashes or significant lesions EYES: normal, Conjunctiva are pink and non-injected, sclera clear OROPHARYNX:no exudate, no erythema and lips, buccal mucosa, and tongue normal  NECK: supple, thyroid normal size, non-tender, without nodularity LYMPH:  no palpable lymphadenopathy in the cervical, axillary or inguinal LUNGS: clear to auscultation  and percussion with normal breathing effort HEART: regular rate & rhythm and no murmurs and no lower extremity edema ABDOMEN:abdomen soft, non-tender and normal bowel sounds MUSCULOSKELETAL:no cyanosis of digits and no clubbing  NEURO: alert & oriented x 3 with fluent speech, no focal motor/sensory deficits EXTREMITIES: No lower extremity edema  LABORATORY DATA:  I have reviewed the data as listed   Chemistry      Component Value Date/Time   NA 138 04/19/2017 1005   K 4.0 04/19/2017 1005   CL 104 12/14/2016 0120   CO2 25 04/19/2017 1005   BUN 12.0 04/19/2017 1005   CREATININE 0.7 04/19/2017 1005      Component Value Date/Time   CALCIUM 8.9 04/19/2017 1005   ALKPHOS 50 04/19/2017 1005   AST 14 04/19/2017 1005   ALT 11 04/19/2017 1005   BILITOT 0.31 04/19/2017 1005       Lab Results  Component Value Date   WBC 3.5 (L) 04/19/2017   HGB 10.2 (L) 04/19/2017   HCT 31.1 (L) 04/19/2017   MCV 97.5 04/19/2017  PLT 184 04/19/2017   NEUTROABS 2.2 04/19/2017    ASSESSMENT & PLAN:  Malignant neoplasm of lower-outer quadrant of left breast of female, estrogen receptor positive ([place]) 07/26/2016: Left lumpectomy: IDC 1.8 cm, with DCIS, margins negative, 0/2 lymph nodes, ER 80%, PR 20%, HER-2 negative ratio 1.18, Ki-67 60%, T1cN0 stage IA  Oncotype DX score 51: 10 year risk of recurrence greater than 34%, extremely high risk  Recommendation: 1. Adjuvant chemotherapy with Adriamycin/Cytoxan x 4, then weekly Taxol x 12 2. Adjuvant radiation therapy followed by 3. Adjuvant antiestrogen therapy -------------------------------------------------------------------------------------------------------------------------------- Current treatment: Cycle 12Taxol Toxicities: Grade 1 peripheral neuropathy Grade 2-3 fatigue This will complete her adjuvant chemotherapy. She will be referred to radiation oncology for adjuvant radiation.  Once she completes that she will return back to see  [name] for adjuvant antiestrogen therapy.  I spent 25 minutes talking to the patient of which more than half was spent in counseling and coordination of care.  No orders of the defined types were placed in this encounter.  The patient has a good understanding of the overall plan. she agrees with it. she will call with any problems that may develop before the next visit here.   Rulon Eisenmenger, MD 04/19/17

## 2017-04-19 NOTE — Patient Instructions (Signed)
[facility] Discharge Instructions for Patients Receiving Chemotherapy  Today you received the following chemotherapy agents :  Taxol.  To help prevent nausea and vomiting after your treatment, we encourage you to take your nausea medication as prescribed.   If you develop nausea and vomiting that is not controlled by your nausea medication, call the clinic.   BELOW ARE SYMPTOMS THAT SHOULD BE REPORTED IMMEDIATELY:  *FEVER GREATER THAN 100.5 F  *CHILLS WITH OR WITHOUT FEVER  NAUSEA AND VOMITING THAT IS NOT CONTROLLED WITH YOUR NAUSEA MEDICATION  *UNUSUAL SHORTNESS OF BREATH  *UNUSUAL BRUISING OR BLEEDING  TENDERNESS IN MOUTH AND THROAT WITH OR WITHOUT PRESENCE OF ULCERS  *URINARY PROBLEMS  *BOWEL PROBLEMS  UNUSUAL RASH Items with * indicate a potential emergency and should be followed up as soon as possible.  Feel free to call the clinic should you have any questions or concerns. The clinic phone number is [phone].  Please show the CHEMO ALERT CARD at check-in to the Emergency Department and triage nurse.   

## 2017-04-19 NOTE — Assessment & Plan Note (Signed)
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 Adjuvant chemotherapy with Adriamycin/Cytoxan x 4, then weekly Taxol x 12 2. Adjuvant radiation therapy followed by 3. Adjuvant antiestrogen therapy -------------------------------------------------------------------------------------------------------------------------------- Current treatment: Cycle 12Taxol Toxicities: Grade 1 peripheral neuropathy This will complete her adjuvant chemotherapy. She will be referred to radiation oncology for adjuvant radiation.  Once she completes that she will return back to see [name] for adjuvant antiestrogen therapy.

## 2017-04-20 ENCOUNTER — Telehealth: Payer: Self-pay

## 2017-04-20 NOTE — Telephone Encounter (Signed)
No orders. Per 11/30 los

## 2017-04-22 MED FILL — SSD 1% CREAM: 1 | 30 days supply | Qty: 400 | Fill #2

## 2017-04-22 MED FILL — raNITIdine HCL 300 MG TABS: 300 | 30 days supply | Qty: 60 | Fill #1

## 2017-04-22 MED FILL — VALACYCLOVIR HCL 500 MG TAB: 500 | 90 days supply | Qty: 180 | Fill #0

## 2017-05-01 ENCOUNTER — Telehealth: Payer: Self-pay | Admitting: Radiation Oncology

## 2017-05-01 NOTE — Telephone Encounter (Signed)
I called the patient to see if she'd like to sim following her appointment next week with Dr. Lisbeth Renshaw. She will still come for f/u  New appt but would like to hold off on treatment until 06/10/17 when she returns back from a business trip. She will simulate on 05/31/17.

## 2017-05-06 NOTE — Progress Notes (Signed)
Follow up New Consult Left breast Lower outer Quadrant   Diagnosis 07/26/2016: 1. Breast, lumpectomy, Left w/seed - INVASIVE AND IN SITU DUCTAL CARCINOMA, 1.8 CM. - MARGINS NOT INVOLVED. - INVASIVE AND IN SITU CARCINOMA 0.3 CM FROM INFERIOR MARGIN. - FIBROCYSTIC CHANGES. - SEE ONCOLOGY TABLE. 2. Lymph node, sentinel, biopsy, Left Axillary - ONE BENIGN LYMPH NODE (0/1) 3. Lymph node, sentinel, biopsy, Left - ONE BENIGN LYMPH NODE (0/1)  Medical Oncology last note 04/19/17: Dr. Gudena ,MD  ASSESSMENT & PLAN:  Malignant neoplasm of lower-outer quadrant of left breast of female, estrogen receptor positive (HCC) 07/26/2016: Left lumpectomy: IDC 1.8 cm, with DCIS, margins negative, 0/2 lymph nodes, ER 80%, PR 20%, HER-2 negative ratio 1.18, Ki-67 60%, T1cN0 stage IA  Oncotype DX score 51: 10 year risk of recurrence greater than 34%, extremely high risk  Recommendation: 1. Adjuvant chemotherapy with Adriamycin/Cytoxan x 4, then weekly Taxol x 12 completed 2. Adjuvant radiation therapy followed by 3. Adjuvant antiestrogen therapy -------------------------------------------------------------------------------------------------------------------------------- Current treatment: Cycle12Taxol completed Toxicities: Grade 1 peripheral neuropathy Grade 2-3 fatigue This will complete her adjuvant chemotherapy. She will be referred to radiation oncology for adjuvant radiation.  Once she completes that she will return back to see us for adjuvant antiestrogen therapy.   Allergie: ASA,Oxycodone hcl' propoxyphene, acetaminophen,tramadol;adhesive tape,prednisone  BP 104/65   Pulse 72   Temp 98.2 F (36.8 C) (Oral)   Resp 20   Ht 5' 1" (1.549 m)   Wt 200 lb (90.7 kg)   BMI 37.79 kg/m   Wt Readings from Last 3 Encounters:  05/09/17 200 lb (90.7 kg)  04/19/17 201 lb 3.2 oz (91.3 kg)  04/12/17 197 lb 9.6 oz (89.6 kg)    

## 2017-05-07 DIAGNOSIS — Z452 Encounter for adjustment and management of vascular access device: Secondary | ICD-10-CM | POA: Diagnosis not present

## 2017-05-09 ENCOUNTER — Ambulatory Visit
Admission: RE | Admit: 2017-05-09 | Discharge: 2017-05-09 | Disposition: A | Discharge status: Home / Self Care | Payer: Medicare Other | Source: Ambulatory Visit | Attending: Radiation Oncology | Admitting: Radiation Oncology

## 2017-05-09 ENCOUNTER — Encounter: Payer: Self-pay | Admitting: Radiation Oncology

## 2017-05-09 VITALS — BP 104/65 | HR 72 | Temp 98.2°F | Resp 20 | Ht 61.0 in | Wt 200.0 lb

## 2017-05-09 DIAGNOSIS — C50212 Malignant neoplasm of upper-inner quadrant of left female breast: Secondary | ICD-10-CM

## 2017-05-09 DIAGNOSIS — Z8042 Family history of malignant neoplasm of prostate: Secondary | ICD-10-CM | POA: Insufficient documentation

## 2017-05-09 DIAGNOSIS — C50312 Malignant neoplasm of lower-inner quadrant of left female breast: Secondary | ICD-10-CM | POA: Insufficient documentation

## 2017-05-09 DIAGNOSIS — Z510 Encounter for antineoplastic radiation therapy: Secondary | ICD-10-CM | POA: Diagnosis not present

## 2017-05-09 DIAGNOSIS — Z8673 Personal history of transient ischemic attack (TIA), and cerebral infarction without residual deficits: Secondary | ICD-10-CM | POA: Insufficient documentation

## 2017-05-09 DIAGNOSIS — Z170 Estrogen receptor positive status [ER+]: Secondary | ICD-10-CM

## 2017-05-09 DIAGNOSIS — Z9884 Bariatric surgery status: Secondary | ICD-10-CM | POA: Diagnosis not present

## 2017-05-09 DIAGNOSIS — M545 Low back pain: Secondary | ICD-10-CM | POA: Insufficient documentation

## 2017-05-09 DIAGNOSIS — Z803 Family history of malignant neoplasm of breast: Secondary | ICD-10-CM | POA: Insufficient documentation

## 2017-05-09 DIAGNOSIS — C50512 Malignant neoplasm of lower-outer quadrant of left female breast: Secondary | ICD-10-CM

## 2017-05-09 DIAGNOSIS — D649 Anemia, unspecified: Secondary | ICD-10-CM | POA: Insufficient documentation

## 2017-05-09 DIAGNOSIS — M797 Fibromyalgia: Secondary | ICD-10-CM | POA: Insufficient documentation

## 2017-05-09 DIAGNOSIS — M129 Arthropathy, unspecified: Secondary | ICD-10-CM | POA: Insufficient documentation

## 2017-05-09 DIAGNOSIS — J45909 Unspecified asthma, uncomplicated: Secondary | ICD-10-CM | POA: Diagnosis not present

## 2017-05-09 DIAGNOSIS — Z9889 Other specified postprocedural states: Secondary | ICD-10-CM | POA: Diagnosis not present

## 2017-05-09 DIAGNOSIS — Z79899 Other long term (current) drug therapy: Secondary | ICD-10-CM | POA: Diagnosis not present

## 2017-05-09 DIAGNOSIS — G4730 Sleep apnea, unspecified: Secondary | ICD-10-CM | POA: Insufficient documentation

## 2017-05-09 DIAGNOSIS — Z86718 Personal history of other venous thrombosis and embolism: Secondary | ICD-10-CM | POA: Diagnosis not present

## 2017-05-09 DIAGNOSIS — Z9221 Personal history of antineoplastic chemotherapy: Secondary | ICD-10-CM | POA: Diagnosis not present

## 2017-05-09 DIAGNOSIS — Z8041 Family history of malignant neoplasm of ovary: Secondary | ICD-10-CM | POA: Insufficient documentation

## 2017-05-09 DIAGNOSIS — I10 Essential (primary) hypertension: Secondary | ICD-10-CM | POA: Diagnosis not present

## 2017-05-09 NOTE — Progress Notes (Signed)
Radiation Oncology         [phone] ________________________________  Name: [name] MRN: [id number]  Date: 05/09/2017  DOB: Jan 26, 1961  [id number], Alicia Saxon, MD  Alicia Lose, MD     REFERRING PHYSICIAN: Nicholas Lose, MD   DIAGNOSIS: The encounter diagnosis was Malignant neoplasm of lower-outer quadrant of left breast of female, estrogen receptor positive ([place]). Invasive ductal carcinoma with DCIS of the left breast, ER/PR+, Her2-, grade 3  HISTORY OF PRESENT ILLNESS: [name] is a 56 y.o. female seen at the request of Dr. Brantley [name]. The patient palpated a lump in her left breast accompanied by discomfort in December 2017. She proceeded to have a mammogram on 06/22/16. This revealed a 1.8 cm lesion of the left breast at the 3 o'clock position, 6 cm from the nipple. Ultrasound-guided biopsy on 06/26/16 showed invasive ductal carcinoma with DCIS, grade 3. Receptor status was ER (80%) PR (20%) Her2 (-) and Ki-67(60%).  She was seen by Dr. Lindi [name] on 07/10/16 who recommended oncotype testing to determine the benefit of chemotherapy. Oncotype dx score was 51 with a 10 year risk of recurrence greater than 34%, extremely high risk. Patient underwent left breast lumpectomy on 07/26/16 which revealed invasive and in situ ductal carcinoma measuring 1.8 cm. Margins were negative and there were 2 benign lymph nodes. Patient then underwent adjuvant chemotherapy with Adriamycin/Cytoxan x4 followed by weekly Taxol x 12. She completed Taxol on 04/19/17. She presents today to discuss and plan her adjuvant radiation treatment.   PREVIOUS RADIATION THERAPY: No   PAST MEDICAL HISTORY:  Past Medical History:  Diagnosis Date  . Anemia    since bypass  . Arthritis    osteoarthritis  . Asthma    states no asthma attack since 2002  . Breast cancer ([place]) 06/26/16 bx   left breast  . Chronic back pain   . Complication of anesthesia    states takes more than normal to put her to sleep  .  Dental bridge present    upper  . Dental crowns present   . DVT of upper extremity (deep vein thrombosis) ([place])   . Fibromyalgia   . Genetic testing 09/19/2016   [name] underwent genetic counseling and testing for hereditary cancer syndromes on 08/21/2016. Her results were negative for mutations in all 46 genes analyzed by [employer]'s 46-gene Common Hereditary Cancers Panel. Genes analyzed include: APC, ATM, AXIN2, BARD1, BMPR1A, BRCA1, BRCA2, BRIP1, CDH1, CDKN2A, CHEK2, CTNNA1, DICER1, EPCAM, GREM1, HOXB13, KIT, MEN1, MLH1, MSH2, MSH3, MSH6, MUTYH, NBN,   . Headache(784.0)    migraines  . History of blood transfusion 06/2005  . History of gallstones   . History of pneumonia   . History of shingles 07/2011  . HTN (hypertension)   . Normal coronary arteries 2003  . PFO (patent foramen ovale)   . Presence of inferior vena cava filter   . Pulmonary embolism ([place])   . Sleep apnea    used CPAP until after bypass surg.  . Status post gastric bypass for obesity   . Stroke [facility]) 12/2002   right-sided weakness  . Urinary incontinence        PAST SURGICAL HISTORY: Past Surgical History:  Procedure Laterality Date  . ABDOMINAL HYSTERECTOMY  11/1997   complete  . ANTERIOR CERVICAL DECOMP/DISCECTOMY FUSION  02/05/2005   C5-6  . APPENDECTOMY  10/22/2008   laparoscopic  . BREAST LUMPECTOMY WITH RADIOACTIVE SEED AND SENTINEL LYMPH NODE BIOPSY Left 07/26/2016   Procedure: BREAST LUMPECTOMY WITH  RADIOACTIVE SEED AND SENTINEL LYMPH NODE BIOPSY;  Surgeon: Alicia Luna, MD;  Location: [facility];  Service: General;  Laterality: Left;  . BUNIONECTOMY  05/1980   both feet  . BUNIONECTOMY  08/2011   left foot  . CARDIAC CATHETERIZATION  03/04/2002  . CARPAL TUNNEL RELEASE  06/21/2009   right  . CARPAL TUNNEL RELEASE     left hand  . CARPAL TUNNEL RELEASE  10/03/2011   Procedure: CARPAL TUNNEL RELEASE;  Surgeon: Wynonia Sours, MD;  Location: [facility];  Service: Orthopedics;   Laterality: Right;  CARPAL TUNNEL WITH HYPOTHENAR FAT PAD TRANSFER  . CERVICAL SPINE SURGERY  01/2005   titanium plate implanted  . CHOLECYSTECTOMY  1990  . ELBOW SURGERY  08/09/2004   decompression ulnar nerve right elbow  . ENTEROLYSIS  10/22/2008   laparoscopic abd. enterolysis  . GASTRIC ROUX-EN-Y  2009  . HEEL SPUR SURGERY  08/1997   left  . HEMORRHOID SURGERY  03/1993  . LAPAROSCOPIC LYSIS INTESTINAL ADHESIONS  02/14/2000  . NAILBED REPAIR  01/10/2005; 08/2011   exc. matrix bilat. great toe  . OTHER SURGICAL HISTORY  12/1986   pt states that she had surgery to unclog her fallopean tubes  . PORTACATH PLACEMENT N/A 09/24/2016   Procedure: INSERTION PORT-A-CATH WITH [name];  Surgeon: Alicia Luna, MD;  Location: [facility];  Service: General;  Laterality: N/A;  . SHOULDER SURGERY     bilat. - (left:  06/2005)  . TONSILLECTOMY  07/1995  . TRIGGER FINGER RELEASE  04/25/2006   decompression A-1 pulley left thumb  . UTERINE FIBROID SURGERY  12/95, 7/96   x2  . VENA CAVA FILTER PLACEMENT  2009   during Roux-en-Y surg.     FAMILY HISTORY:  Family History  Problem Relation Age of Onset  . Breast cancer Paternal Aunt 9  . Cervical cancer Paternal Grandmother 35       d.40s  . Ovarian cancer Maternal Grandmother 23       d.23  . Colon polyps Father   . Diabetes Father        borderline  . Prostate cancer Father   . Hypertension Mother   . Hypertension Unknown   . Breast cancer Sister 96       treated with neoadjuvant chemo/radiation and lumpectomy   Her father had prostate cancer, paternal aunt had breast cancer, paternal grandmother had cervical cancer, maternal grandmother had ovarian cancer, and her sister had breast cancer. Sister reports receiving genetic testing in 2016 herself and the results were negative.   SOCIAL HISTORY:  reports that she has quit smoking. she has never used smokeless tobacco. She reports that she does not drink alcohol or use drugs. The patient is single and  lives in [place].   ALLERGIES: Aspirin; Oxycodone hcl; Propoxyphene n-acetaminophen; Tramadol; Adhesive [tape]; and Prednisone   MEDICATIONS:  Current Outpatient Medications  Medication Sig Dispense Refill  . acetaminophen (TYLENOL) 500 MG tablet Take 1,000 mg by mouth every 6 (six) hours as needed (pain).    Marland Kitchen albuterol (PROAIR HFA) 108 (90 Base) MCG/ACT inhaler Inhale 2 puffs into the lungs every 6 (six) hours as needed for wheezing or shortness of breath.    . Calcium Citrate-Vitamin D (CALCIUM CITRATE +D PO) Take 2 tablets by mouth daily. Calcium 600 mg     . cyclobenzaprine (FLEXERIL) 5 MG tablet Take 5 mg by mouth 3 (three) times daily as needed for muscle spasms.    . fluticasone (  FLONASE) 50 MCG/ACT nasal spray Place 1 spray into both nostrils 2 (two) times daily. 16 g 5  . gabapentin (NEURONTIN) 300 MG capsule Take 300 mg by mouth 3 (three) times daily.    Marland Kitchen lisinopril (PRINIVIL,ZESTRIL) 20 MG tablet Take 20 mg by mouth daily.    . mometasone (ELOCON) 0.1 % cream Apply 1 application topically daily as needed (rash - summer eczema). Apply to arms    . Multiple Vitamin (MULTIVITAMIN WITH MINERALS) TABS tablet Take 1 tablet by mouth at bedtime.    Marland Kitchen oxybutynin (DITROPAN) 5 MG tablet TAKE 1 TABLET (5 MG TOTAL) BY MOUTH 3 (THREE) TIMES DAILY. 90 tablet 0  . ranitidine (ZANTAC) 300 MG capsule Take 1 capsule (300 mg total) by mouth 2 (two) times daily. 60 capsule 3  . silver sulfADIAZINE (SILVADENE) 1 % cream Apply 1 application topically 2 (two) times daily as needed (stomach tears). 50 g 0  . valACYclovir (VALTREX) 500 MG tablet Take 1 tablet by mouth as needed.  1  . omeprazole (PRILOSEC) 40 MG capsule Take 1 capsule (40 mg total) by mouth daily. (Patient not taking: Reported on 05/09/2017) 30 capsule 5  . sucralfate (CARAFATE) 1 g tablet Take 1 tablet (1 g total) by mouth 4 (four) times daily -  with meals and at bedtime. (Patient not taking: Reported on 05/09/2017) 120 tablet 0   No  current facility-administered medications for this encounter.      REVIEW OF SYSTEMS: On review of systems, the patient reports that she is doing well overall. She denies any chest pain, shortness of breath, cough, fevers, chills, night sweats, unintended weight changes. She denies any bowel or bladder disturbances, and denies abdominal pain, nausea or vomiting. She denies any new musculoskeletal or joint aches or pains. Patient reports occasional sharp pain in the left chest wall radiating to the nipple. She denies lymphedema at this time. She denies bleeding or discharge of nipples, or signs of infection. A complete review of systems is obtained and is otherwise negative.   PHYSICAL EXAM:  Wt Readings from Last 3 Encounters:  05/09/17 200 lb (90.7 kg)  04/19/17 201 lb 3.2 oz (91.3 kg)  04/12/17 197 lb 9.6 oz (89.6 kg)   Temp Readings from Last 3 Encounters:  05/09/17 98.2 F (36.8 C) (Oral)  04/19/17 98.3 F (36.8 C) (Oral)  04/12/17 98 F (36.7 C) (Oral)   BP Readings from Last 3 Encounters:  05/09/17 104/65  04/19/17 (!) 141/83  04/12/17 138/73   Pulse Readings from Last 3 Encounters:  05/09/17 72  04/19/17 84  04/12/17 77   Pain Assessment Pain Score: 2  Pain Loc: Breast(where port a cath was removed a few days ago tende)/10  In general this is a well appearing african american woman in no acute distress. She's alert and oriented x4 and appropriate throughout the examination. Cardiopulmonary assessment is negative for acute distress and she exhibits normal effort. She is ambulatory with a cane.  ECOG = 1  0 - Asymptomatic (Fully active, able to carry on all predisease activities without restriction)  1 - Symptomatic but completely ambulatory (Restricted in physically strenuous activity but ambulatory and able to carry out work of a light or sedentary nature. For example, light housework, office work)  2 - Symptomatic, <50% in bed during the day (Ambulatory and capable  of all self care but unable to carry out any work activities. Up and about more than 50% of waking hours)  3 - Symptomatic, >50%  in bed, but not bedbound (Capable of only limited self-care, confined to bed or chair 50% or more of waking hours)  4 - Bedbound (Completely disabled. Cannot carry on any self-care. Totally confined to bed or chair)  5 - Death   Eustace Pen MM, Creech RH, Tormey DC, et al. [phone]). "Toxicity and response criteria of the [facility]". [place] Oncol. 5 (6): 649-55    LABORATORY DATA:  Lab Results  Component Value Date   WBC 3.5 (L) 04/19/2017   HGB 10.2 (L) 04/19/2017   HCT 31.1 (L) 04/19/2017   MCV 97.5 04/19/2017   PLT 184 04/19/2017   Lab Results  Component Value Date   NA 138 04/19/2017   K 4.0 04/19/2017   CL 104 12/14/2016   CO2 25 04/19/2017   Lab Results  Component Value Date   ALT 11 04/19/2017   AST 14 04/19/2017   ALKPHOS 50 04/19/2017   BILITOT 0.31 04/19/2017      RADIOGRAPHY: No results found.     IMPRESSION/PLAN: 1. [name] IA, cT1c, N0, Mx , ER/PR positive grade 3 invasive ductal carcinoma of the left breast. Patient is status post left breast lumpectomy and adjuvant chemotherapy. Dr. Lisbeth Renshaw discusses the pathology findings and reviews the nature of invasive breast disease. We discussed the risks, benefits, short, and long term effects of radiotherapy, and the patient is interested in proceeding. Dr. Lisbeth Renshaw discusses the delivery and logistics of radiotherapy.We would anticipate a course of 6 1/2 weeks of treatment, and use breath hold technique to avoid exposure to the heart. A consent form was signed and a copy was provided to the patient. Patient will be scheduled for CT simulation and treatment planning on 05/31/17. Anticipate treatment to begin on 06/10/17.   This document serves as a record of services personally performed by Kyung Rudd, MD. It was created on his behalf by Bethann Humble, a trained medical scribe.  The creation of this record is based on the scribe's personal observations and the provider's statements to them. This document has been checked and approved by the attending provider.

## 2017-05-09 NOTE — Progress Notes (Signed)
Please see the Nurse Progress Note in the MD Initial Consult Encounter for this patient. 

## 2017-05-31 ENCOUNTER — Ambulatory Visit
Admission: RE | Admit: 2017-05-31 | Discharge: 2017-05-31 | Disposition: A | Discharge status: Home / Self Care | Payer: Medicare Other | Source: Ambulatory Visit | Attending: Radiation Oncology | Admitting: Radiation Oncology

## 2017-05-31 ENCOUNTER — Encounter: Payer: Self-pay | Admitting: Genetic Counselor

## 2017-05-31 DIAGNOSIS — C50312 Malignant neoplasm of lower-inner quadrant of left female breast: Secondary | ICD-10-CM | POA: Diagnosis not present

## 2017-05-31 DIAGNOSIS — C50512 Malignant neoplasm of lower-outer quadrant of left female breast: Secondary | ICD-10-CM | POA: Diagnosis not present

## 2017-06-04 ENCOUNTER — Telehealth: Payer: Self-pay | Admitting: Hematology and Oncology

## 2017-06-04 NOTE — Telephone Encounter (Signed)
Left message for patient regarding upcoming March appointments per 1/15 sch message

## 2017-06-06 DIAGNOSIS — C50312 Malignant neoplasm of lower-inner quadrant of left female breast: Secondary | ICD-10-CM | POA: Diagnosis not present

## 2017-06-06 DIAGNOSIS — C50512 Malignant neoplasm of lower-outer quadrant of left female breast: Secondary | ICD-10-CM | POA: Diagnosis not present

## 2017-06-10 ENCOUNTER — Ambulatory Visit
Admission: RE | Admit: 2017-06-10 | Discharge: 2017-06-10 | Disposition: A | Discharge status: Home / Self Care | Payer: Medicare Other | Source: Ambulatory Visit | Attending: Radiation Oncology | Admitting: Radiation Oncology

## 2017-06-10 DIAGNOSIS — C50312 Malignant neoplasm of lower-inner quadrant of left female breast: Secondary | ICD-10-CM | POA: Diagnosis not present

## 2017-06-10 DIAGNOSIS — C50512 Malignant neoplasm of lower-outer quadrant of left female breast: Secondary | ICD-10-CM | POA: Diagnosis not present

## 2017-06-11 ENCOUNTER — Telehealth: Payer: Self-pay

## 2017-06-11 ENCOUNTER — Ambulatory Visit
Admission: RE | Admit: 2017-06-11 | Discharge: 2017-06-11 | Disposition: A | Discharge status: Home / Self Care | Payer: Medicare Other | Source: Ambulatory Visit | Attending: Radiation Oncology | Admitting: Radiation Oncology

## 2017-06-11 ENCOUNTER — Other Ambulatory Visit: Payer: Self-pay

## 2017-06-11 ENCOUNTER — Ambulatory Visit (HOSPITAL_COMMUNITY)
Admission: RE | Admit: 2017-06-11 | Discharge: 2017-06-11 | Disposition: A | Discharge status: Home / Self Care | Payer: Medicare Other | Source: Ambulatory Visit | Attending: Hematology and Oncology | Admitting: Hematology and Oncology

## 2017-06-11 DIAGNOSIS — M25552 Pain in left hip: Secondary | ICD-10-CM | POA: Diagnosis not present

## 2017-06-11 DIAGNOSIS — C50312 Malignant neoplasm of lower-inner quadrant of left female breast: Secondary | ICD-10-CM | POA: Diagnosis not present

## 2017-06-11 DIAGNOSIS — C50512 Malignant neoplasm of lower-outer quadrant of left female breast: Secondary | ICD-10-CM | POA: Diagnosis not present

## 2017-06-11 NOTE — Telephone Encounter (Signed)
Pt stopped by yesterday in infusion to inquire about her wosening left hip pani that started prior to completing chemo 2 months ago. Pt states that her left hip pain have worsened over the last 3 weeks. Pt is needing to take tylenol strenght 3x a day with Flexeril. Pt having difficulty with movement and sitting in the car.   Discussed pt symptoms and obtained order for left hip xray today. Informed pt that she may go to radiology dept after her radiation treatment today. She does not need to make an appt for xray today. Pt verbalized understanding and will get her xrays taken today. Pt will be contacted once results are displayed in epic. No further needs at this time.

## 2017-06-12 ENCOUNTER — Ambulatory Visit
Admission: RE | Admit: 2017-06-12 | Discharge: 2017-06-12 | Disposition: A | Discharge status: Home / Self Care | Payer: Medicare Other | Source: Ambulatory Visit | Attending: Radiation Oncology | Admitting: Radiation Oncology

## 2017-06-12 DIAGNOSIS — C50512 Malignant neoplasm of lower-outer quadrant of left female breast: Secondary | ICD-10-CM

## 2017-06-12 DIAGNOSIS — C50312 Malignant neoplasm of lower-inner quadrant of left female breast: Secondary | ICD-10-CM | POA: Diagnosis not present

## 2017-06-12 DIAGNOSIS — Z170 Estrogen receptor positive status [ER+]: Secondary | ICD-10-CM

## 2017-06-12 MED ORDER — RADIAPLEXRX EX GEL
Freq: Once | CUTANEOUS | Status: AC
Start: 2017-06-12 — End: 2017-06-12
  Administered 2017-06-12: 14:00:00 via TOPICAL

## 2017-06-12 MED ORDER — ALRA NON-METALLIC DEODORANT (RAD-ONC)
1.0000 "application " | Freq: Once | TOPICAL | Status: AC
  Administered 2017-06-12: 1 via TOPICAL

## 2017-06-13 ENCOUNTER — Other Ambulatory Visit: Payer: Self-pay

## 2017-06-13 ENCOUNTER — Other Ambulatory Visit: Payer: Self-pay | Admitting: Adult Health

## 2017-06-13 ENCOUNTER — Ambulatory Visit
Admission: RE | Admit: 2017-06-13 | Discharge: 2017-06-13 | Disposition: A | Discharge status: Home / Self Care | Payer: Medicare Other | Source: Ambulatory Visit | Attending: Radiation Oncology | Admitting: Radiation Oncology

## 2017-06-13 DIAGNOSIS — C50512 Malignant neoplasm of lower-outer quadrant of left female breast: Secondary | ICD-10-CM | POA: Diagnosis not present

## 2017-06-13 DIAGNOSIS — Z170 Estrogen receptor positive status [ER+]: Secondary | ICD-10-CM

## 2017-06-13 DIAGNOSIS — C50312 Malignant neoplasm of lower-inner quadrant of left female breast: Secondary | ICD-10-CM | POA: Diagnosis not present

## 2017-06-13 DIAGNOSIS — R350 Frequency of micturition: Secondary | ICD-10-CM

## 2017-06-13 MED ORDER — ACETAMINOPHEN-CODEINE #3 300-30 MG PO TABS: 1.0000 | ORAL_TABLET | ORAL | 0 refills | Status: DC | PRN

## 2017-06-13 NOTE — Telephone Encounter (Signed)
Will you see if patient needs refill on this?

## 2017-06-13 NOTE — Progress Notes (Signed)
Pt came in the lobby to inquire about left hip xray. Results reviewed by Dr.Gudena and was negative. Orders were obtained for MRI of L hip.   Pt agreeable and scheduled pt for appt on Wednesday next week 06/19/17 at [facility]. Pt confirmed this appt.  Pt will need updated lab work prior to MRI. She will be coming in tomorrow and would like to have it done after her radonc treatment.Pt verbalized understanding.  Advised pt to apply heat on the affected hip area for 20 min. Off/on while awake for comfort. Keep taking OTC tylenol. Contraindicated for NSAIDS d/t hx gastric bypass surgery. Pt may apply OTC topical pain cream w/o nsaids. If pain persist, may obtain pain medication from MD.   Pt states that she was allowed to have Tylenol 3 for pain in the past. She has an intolerance to a few other pain medications. Pt will need to stop taking OTC tylenol if new pain script for tylenol#3 given. Pt voiced understanding and will try all suggestions  No further needs at this time.

## 2017-06-14 ENCOUNTER — Ambulatory Visit
Admission: RE | Admit: 2017-06-14 | Discharge: 2017-06-14 | Disposition: A | Discharge status: Home / Self Care | Payer: Medicare Other | Source: Ambulatory Visit | Attending: Radiation Oncology | Admitting: Radiation Oncology

## 2017-06-14 ENCOUNTER — Inpatient Hospital Stay: Payer: Medicare Other | Attending: Hematology and Oncology

## 2017-06-14 ENCOUNTER — Other Ambulatory Visit: Payer: Self-pay

## 2017-06-14 ENCOUNTER — Telehealth: Payer: Self-pay | Admitting: Genetic Counselor

## 2017-06-14 DIAGNOSIS — T451X5A Adverse effect of antineoplastic and immunosuppressive drugs, initial encounter: Secondary | ICD-10-CM

## 2017-06-14 DIAGNOSIS — C50512 Malignant neoplasm of lower-outer quadrant of left female breast: Secondary | ICD-10-CM | POA: Diagnosis not present

## 2017-06-14 DIAGNOSIS — Z170 Estrogen receptor positive status [ER+]: Secondary | ICD-10-CM

## 2017-06-14 DIAGNOSIS — G620 Drug-induced polyneuropathy: Secondary | ICD-10-CM

## 2017-06-14 DIAGNOSIS — C50312 Malignant neoplasm of lower-inner quadrant of left female breast: Secondary | ICD-10-CM | POA: Diagnosis not present

## 2017-06-14 LAB — CBC WITH DIFFERENTIAL (CANCER CENTER ONLY)
Basophils Absolute: 0 10*3/uL (ref 0.0–0.1)
Basophils Relative: 1 %
Eosinophils Absolute: 0.1 10*3/uL (ref 0.0–0.5)
Eosinophils Relative: 3 %
HCT: 33.1 % — ABNORMAL LOW (ref 34.8–46.6)
Hemoglobin: 10.9 g/dL — ABNORMAL LOW (ref 11.6–15.9)
Lymphocytes Relative: 31 %
Lymphs Abs: 0.8 10*3/uL — ABNORMAL LOW (ref 0.9–3.3)
MCH: 31.8 pg (ref 25.1–34.0)
MCHC: 33 g/dL (ref 31.5–36.0)
MCV: 96.4 fL (ref 79.5–101.0)
Monocytes Absolute: 0.3 10*3/uL (ref 0.1–0.9)
Monocytes Relative: 12 %
Neutro Abs: 1.3 10*3/uL — ABNORMAL LOW (ref 1.5–6.5)
Neutrophils Relative %: 53 %
Platelet Count: 188 10*3/uL (ref 145–400)
RBC: 3.44 MIL/uL — ABNORMAL LOW (ref 3.70–5.45)
RDW: 14.7 % (ref 11.2–16.1)
WBC Count: 2.5 10*3/uL — ABNORMAL LOW (ref 3.9–10.3)

## 2017-06-14 LAB — CMP (CANCER CENTER ONLY)
ALT: 9 U/L (ref 0–55)
AST: 16 U/L (ref 5–34)
Albumin: 4 g/dL (ref 3.5–5.0)
Alkaline Phosphatase: 67 U/L (ref 40–150)
Anion gap: 7 (ref 3–11)
BUN: 11 mg/dL (ref 7–26)
CO2: 24 mmol/L (ref 22–29)
Calcium: 9 mg/dL (ref 8.4–10.4)
Chloride: 107 mmol/L (ref 98–109)
Creatinine: 0.67 mg/dL (ref 0.60–1.10)
GFR, Est AFR Am: >60 mL/min (ref >60)
GFR, Estimated: >60 mL/min (ref >60)
Glucose, Bld: 97 mg/dL (ref 70–140)
Potassium: 4.1 mmol/L (ref 3.3–4.7)
Sodium: 138 mmol/L (ref 136–145)
Total Bilirubin: 0.4 mg/dL (ref 0.2–1.2)
Total Protein: 7 g/dL (ref 6.4–8.3)

## 2017-06-14 MED FILL — OXYBUTYNIN 5 MG TABLET: 5 | 90 days supply | Qty: 270 | Fill #0 | Status: TO

## 2017-06-14 NOTE — Telephone Encounter (Signed)
LM on VM asking her to CB.  I have some updated information on her bill.

## 2017-06-17 ENCOUNTER — Telehealth: Payer: Self-pay | Admitting: Genetic Counselor

## 2017-06-17 ENCOUNTER — Ambulatory Visit
Admission: RE | Admit: 2017-06-17 | Discharge: 2017-06-17 | Disposition: A | Discharge status: Home / Self Care | Payer: Medicare Other | Source: Ambulatory Visit | Attending: Radiation Oncology | Admitting: Radiation Oncology

## 2017-06-17 DIAGNOSIS — C50312 Malignant neoplasm of lower-inner quadrant of left female breast: Secondary | ICD-10-CM | POA: Diagnosis not present

## 2017-06-17 DIAGNOSIS — C50512 Malignant neoplasm of lower-outer quadrant of left female breast: Secondary | ICD-10-CM | POA: Diagnosis not present

## 2017-06-17 NOTE — Telephone Encounter (Signed)
Discussed that patient's OOP cost will be reduced to $100 rather than th e$300 her bill stated.  The lab released her result before the billing team could finish the investigation and therefore, they did not comply with their policy.  She will receive an updated bill for $100 in the next few weeks.  Patient voiced her understanding.

## 2017-06-18 ENCOUNTER — Ambulatory Visit
Admission: RE | Admit: 2017-06-18 | Discharge: 2017-06-18 | Disposition: A | Discharge status: Home / Self Care | Payer: Medicare Other | Source: Ambulatory Visit | Attending: Radiation Oncology | Admitting: Radiation Oncology

## 2017-06-18 DIAGNOSIS — C50512 Malignant neoplasm of lower-outer quadrant of left female breast: Secondary | ICD-10-CM | POA: Diagnosis not present

## 2017-06-18 DIAGNOSIS — C50312 Malignant neoplasm of lower-inner quadrant of left female breast: Secondary | ICD-10-CM | POA: Diagnosis not present

## 2017-06-19 ENCOUNTER — Ambulatory Visit
Admission: RE | Admit: 2017-06-19 | Discharge: 2017-06-19 | Disposition: A | Discharge status: Home / Self Care | Payer: Medicare Other | Source: Ambulatory Visit | Attending: Radiation Oncology | Admitting: Radiation Oncology

## 2017-06-19 ENCOUNTER — Ambulatory Visit (HOSPITAL_COMMUNITY)
Admission: RE | Admit: 2017-06-19 | Discharge: 2017-06-19 | Disposition: A | Discharge status: Home / Self Care | Payer: Medicare Other | Source: Ambulatory Visit | Attending: Hematology and Oncology | Admitting: Hematology and Oncology

## 2017-06-19 DIAGNOSIS — M65852 Other synovitis and tenosynovitis, left thigh: Secondary | ICD-10-CM | POA: Diagnosis not present

## 2017-06-19 DIAGNOSIS — C50512 Malignant neoplasm of lower-outer quadrant of left female breast: Secondary | ICD-10-CM | POA: Insufficient documentation

## 2017-06-19 DIAGNOSIS — Z170 Estrogen receptor positive status [ER+]: Secondary | ICD-10-CM | POA: Insufficient documentation

## 2017-06-19 DIAGNOSIS — M7989 Other specified soft tissue disorders: Secondary | ICD-10-CM | POA: Diagnosis not present

## 2017-06-19 DIAGNOSIS — C50312 Malignant neoplasm of lower-inner quadrant of left female breast: Secondary | ICD-10-CM | POA: Diagnosis not present

## 2017-06-19 DIAGNOSIS — R600 Localized edema: Secondary | ICD-10-CM | POA: Diagnosis not present

## 2017-06-19 MED ORDER — GADOBENATE DIMEGLUMINE 529 MG/ML IV SOLN
20.0000 mL | Freq: Once | INTRAVENOUS | Status: AC | PRN
  Administered 2017-06-19: 19 mL via INTRAVENOUS

## 2017-06-20 ENCOUNTER — Ambulatory Visit
Admission: RE | Admit: 2017-06-20 | Discharge: 2017-06-20 | Disposition: A | Discharge status: Home / Self Care | Payer: Medicare Other | Source: Ambulatory Visit | Attending: Radiation Oncology | Admitting: Radiation Oncology

## 2017-06-20 DIAGNOSIS — C50512 Malignant neoplasm of lower-outer quadrant of left female breast: Secondary | ICD-10-CM | POA: Diagnosis not present

## 2017-06-20 DIAGNOSIS — C50312 Malignant neoplasm of lower-inner quadrant of left female breast: Secondary | ICD-10-CM | POA: Diagnosis not present

## 2017-06-21 ENCOUNTER — Ambulatory Visit
Admission: RE | Admit: 2017-06-21 | Discharge: 2017-06-21 | Disposition: A | Discharge status: Home / Self Care | Payer: Medicare Other | Source: Ambulatory Visit | Attending: Radiation Oncology | Admitting: Radiation Oncology

## 2017-06-21 DIAGNOSIS — C50512 Malignant neoplasm of lower-outer quadrant of left female breast: Secondary | ICD-10-CM | POA: Diagnosis not present

## 2017-06-21 DIAGNOSIS — C50312 Malignant neoplasm of lower-inner quadrant of left female breast: Secondary | ICD-10-CM | POA: Diagnosis not present

## 2017-06-24 ENCOUNTER — Ambulatory Visit
Admission: RE | Admit: 2017-06-24 | Discharge: 2017-06-24 | Disposition: A | Discharge status: Home / Self Care | Payer: Medicare Other | Source: Ambulatory Visit | Attending: Radiation Oncology | Admitting: Radiation Oncology

## 2017-06-24 DIAGNOSIS — C50312 Malignant neoplasm of lower-inner quadrant of left female breast: Secondary | ICD-10-CM | POA: Diagnosis not present

## 2017-06-24 DIAGNOSIS — C50512 Malignant neoplasm of lower-outer quadrant of left female breast: Secondary | ICD-10-CM | POA: Diagnosis not present

## 2017-06-24 MED ORDER — GADOBENATE DIMEGLUMINE 529 MG/ML IV SOLN
20.0000 mL | Freq: Once | INTRAVENOUS | Status: AC | PRN
  Administered 2017-06-19: 20 mL via INTRAVENOUS

## 2017-06-25 ENCOUNTER — Ambulatory Visit
Admission: RE | Admit: 2017-06-25 | Discharge: 2017-06-25 | Disposition: A | Discharge status: Home / Self Care | Payer: Medicare Other | Source: Ambulatory Visit | Attending: Radiation Oncology | Admitting: Radiation Oncology

## 2017-06-25 DIAGNOSIS — C50312 Malignant neoplasm of lower-inner quadrant of left female breast: Secondary | ICD-10-CM | POA: Diagnosis not present

## 2017-06-25 DIAGNOSIS — C50512 Malignant neoplasm of lower-outer quadrant of left female breast: Secondary | ICD-10-CM | POA: Diagnosis not present

## 2017-06-26 ENCOUNTER — Ambulatory Visit
Admission: RE | Admit: 2017-06-26 | Discharge: 2017-06-26 | Disposition: A | Discharge status: Home / Self Care | Payer: Medicare Other | Source: Ambulatory Visit | Attending: Radiation Oncology | Admitting: Radiation Oncology

## 2017-06-26 ENCOUNTER — Other Ambulatory Visit: Payer: Self-pay | Admitting: *Deleted

## 2017-06-26 ENCOUNTER — Other Ambulatory Visit: Payer: Self-pay

## 2017-06-26 ENCOUNTER — Telehealth: Payer: Self-pay

## 2017-06-26 DIAGNOSIS — C50512 Malignant neoplasm of lower-outer quadrant of left female breast: Secondary | ICD-10-CM | POA: Diagnosis not present

## 2017-06-26 DIAGNOSIS — C50312 Malignant neoplasm of lower-inner quadrant of left female breast: Secondary | ICD-10-CM | POA: Diagnosis not present

## 2017-06-26 DIAGNOSIS — M25552 Pain in left hip: Secondary | ICD-10-CM

## 2017-06-26 DIAGNOSIS — Z170 Estrogen receptor positive status [ER+]: Secondary | ICD-10-CM

## 2017-06-26 NOTE — Progress Notes (Signed)
Per Dr.Gudena, placed referral to [facility] for pt L hip pain. Recent MRI shows inflammation/trochanteric bursitis. Pt sees Dr.Olin and will send referral to his office today and to set up an appt with patient first available. Will fax MRI report to Ortho office. Will contact pt once appt

## 2017-06-26 NOTE — Progress Notes (Signed)
Pt called with c/o left shoulder pain that extended to her elbow. She has felt "heaviness" in her arm twice. Pt also discussed left hip pain and would like to know the next step in treatment for MRI of the hip inflammation.  Discussed complaints with Dr. Lindi Adie.  Referral placed for PT and [facility]. Called pt to discuss plan. Denies further needs or questions at this time.

## 2017-06-26 NOTE — Telephone Encounter (Signed)
Called pt to notify her that an appointment with Dr.Olin's PA, Adrian Prince will be meeting with her tomorrow at 945am. Pt verbalized understanding and will be at [facility] ortho tomorrow.

## 2017-06-27 ENCOUNTER — Ambulatory Visit
Admission: RE | Admit: 2017-06-27 | Discharge: 2017-06-27 | Disposition: A | Discharge status: Home / Self Care | Payer: Medicare Other | Source: Ambulatory Visit | Attending: Radiation Oncology | Admitting: Radiation Oncology

## 2017-06-27 DIAGNOSIS — C50512 Malignant neoplasm of lower-outer quadrant of left female breast: Secondary | ICD-10-CM | POA: Diagnosis not present

## 2017-06-27 DIAGNOSIS — M7062 Trochanteric bursitis, left hip: Secondary | ICD-10-CM | POA: Diagnosis not present

## 2017-06-27 DIAGNOSIS — C50312 Malignant neoplasm of lower-inner quadrant of left female breast: Secondary | ICD-10-CM | POA: Diagnosis not present

## 2017-06-27 DIAGNOSIS — M25552 Pain in left hip: Secondary | ICD-10-CM | POA: Diagnosis not present

## 2017-06-28 ENCOUNTER — Ambulatory Visit
Admission: RE | Admit: 2017-06-28 | Discharge: 2017-06-28 | Disposition: A | Discharge status: Home / Self Care | Payer: Medicare Other | Source: Ambulatory Visit | Attending: Radiation Oncology | Admitting: Radiation Oncology

## 2017-06-28 DIAGNOSIS — C50512 Malignant neoplasm of lower-outer quadrant of left female breast: Secondary | ICD-10-CM | POA: Diagnosis not present

## 2017-06-28 DIAGNOSIS — C50312 Malignant neoplasm of lower-inner quadrant of left female breast: Secondary | ICD-10-CM | POA: Diagnosis not present

## 2017-07-01 ENCOUNTER — Ambulatory Visit: Payer: Medicare Other | Attending: Hematology and Oncology | Admitting: Physical Therapy

## 2017-07-01 ENCOUNTER — Other Ambulatory Visit: Payer: Self-pay

## 2017-07-01 ENCOUNTER — Ambulatory Visit
Admission: RE | Admit: 2017-07-01 | Discharge: 2017-07-01 | Disposition: A | Discharge status: Home / Self Care | Payer: Medicare Other | Source: Ambulatory Visit | Attending: Radiation Oncology | Admitting: Radiation Oncology

## 2017-07-01 ENCOUNTER — Encounter: Payer: Self-pay | Admitting: Physical Therapy

## 2017-07-01 DIAGNOSIS — Z483 Aftercare following surgery for neoplasm: Secondary | ICD-10-CM

## 2017-07-01 DIAGNOSIS — R293 Abnormal posture: Secondary | ICD-10-CM

## 2017-07-01 DIAGNOSIS — M25612 Stiffness of left shoulder, not elsewhere classified: Secondary | ICD-10-CM | POA: Diagnosis not present

## 2017-07-01 DIAGNOSIS — C50512 Malignant neoplasm of lower-outer quadrant of left female breast: Secondary | ICD-10-CM | POA: Diagnosis not present

## 2017-07-01 DIAGNOSIS — C50312 Malignant neoplasm of lower-inner quadrant of left female breast: Secondary | ICD-10-CM | POA: Diagnosis not present

## 2017-07-01 DIAGNOSIS — M25512 Pain in left shoulder: Secondary | ICD-10-CM

## 2017-07-01 NOTE — Patient Instructions (Signed)
Wall Push    Stand at arm's length from wall, feet shoulder width apart. Inhale while gently leaning toward wall. Hold 1 count. Exhale while pushing back to starting position. Repeat _5___ times per set. Do __1__ sets per session. Do __2_ sessions per day.  Copyright  VHI. All rights reserved.  Closed Chain: Shoulder Flexion / Extension - on Wall    Hands on wall, step backward. Return. Stepping causes shoulder flexion and extension Do _5__ times, hold 5 seconds, _2__ times per day.  http://ss.exer.us/265   Copyright  VHI. All rights reserved.  Scapular Retraction (Standing)    With arms at sides, pinch shoulder blades together. Repeat __5__ times per set. Do __2__ sets per session. Do __2__ sessions per day.  http://orth.exer.us/945   Copyright  VHI. All rights reserved.  Flexion (Eccentric) - Active (Cane)    Lift cane with both hands. Avoid hiking shoulders. Lower cane slowly for 3-5 seconds. _10__ reps per set, __2_ sets per day, _7__ days per week.   http://ecce.exer.us/155   Copyright  VHI. All rights reserved.  Cane Exercise: Abduction    Hold cane with right hand over end, palm-up, with other hand palm-down. Move arm out from side and up by pushing with other arm. Hold __2-3__ seconds. Repeat __10__ times. Do __2__ sessions per day.  http://gt2.exer.us/82   Copyright  VHI. All rights reserved.

## 2017-07-02 ENCOUNTER — Ambulatory Visit
Admission: RE | Admit: 2017-07-02 | Discharge: 2017-07-02 | Disposition: A | Discharge status: Home / Self Care | Payer: Medicare Other | Source: Ambulatory Visit | Attending: Radiation Oncology | Admitting: Radiation Oncology

## 2017-07-02 DIAGNOSIS — C50312 Malignant neoplasm of lower-inner quadrant of left female breast: Secondary | ICD-10-CM | POA: Diagnosis not present

## 2017-07-02 DIAGNOSIS — C50512 Malignant neoplasm of lower-outer quadrant of left female breast: Secondary | ICD-10-CM | POA: Diagnosis not present

## 2017-07-02 NOTE — Progress Notes (Signed)
Received patient in the clinic today following radiation treatment. Patient states, "I want to see a nurse about the skin under my arm." Patient has received 16 radiation treatments to her left breast. Patient reports using radiaplex and alra as directed. Faint hyperpigmentation without desquamation of left axilla noted. Pulled up treatment plan on computer for patient to visualize. Outlined the area of the breast receiving radiation and that one of the side effects of radiation therapy is skin changes. Reinforced skin care during radiation. Provided patient with small pillow to carry under her affect arm to reduce friction. Patient verbalized understanding of all reviewed.

## 2017-07-03 ENCOUNTER — Ambulatory Visit
Admission: RE | Admit: 2017-07-03 | Discharge: 2017-07-03 | Disposition: A | Discharge status: Home / Self Care | Payer: Medicare Other | Source: Ambulatory Visit | Attending: Radiation Oncology | Admitting: Radiation Oncology

## 2017-07-03 DIAGNOSIS — C50312 Malignant neoplasm of lower-inner quadrant of left female breast: Secondary | ICD-10-CM | POA: Diagnosis not present

## 2017-07-03 DIAGNOSIS — C50512 Malignant neoplasm of lower-outer quadrant of left female breast: Secondary | ICD-10-CM | POA: Diagnosis not present

## 2017-07-03 NOTE — Therapy (Signed)
[place], [name], [address] Phone: [phone]   Fax:  [phone]  Physical Therapy Evaluation  DELAYED ENTRY. PATIENT WAS SEEN ON 07/01/17. NOTE COMPLETED 07/03/17.  Patient Details  Name: [name] MRN: [id number] Date of Birth: [birth year] Referring Provider: Dr. Nicholas Lose   Encounter Date: 07/01/2017  PT End of Session - 07/03/17 0854    Visit Number  1    Number of Visits  1    PT Start Time  1300    PT Stop Time  1410    PT Time Calculation (min)  70 min    Activity Tolerance  Patient tolerated treatment well    Behavior During Therapy  [facility] for tasks assessed/performed       Past Medical History:  Diagnosis Date  . Anemia    since bypass  . Arthritis    osteoarthritis  . Asthma    states no asthma attack since 2002  . Breast cancer ([place]) 06/26/16 bx   left breast  . Chronic back pain   . Complication of anesthesia    states takes more than normal to put her to sleep  . Dental bridge present    upper  . Dental crowns present   . DVT of upper extremity (deep vein thrombosis) ([place])   . Fibromyalgia   . Genetic testing 09/19/2016   Ms. Ryall underwent genetic counseling and testing for hereditary cancer syndromes on 08/21/2016. Her results were negative for mutations in all 46 genes analyzed by [employer]'s 46-gene Common Hereditary Cancers Panel. Genes analyzed include: APC, ATM, AXIN2, BARD1, BMPR1A, BRCA1, BRCA2, BRIP1, CDH1, CDKN2A, CHEK2, CTNNA1, DICER1, EPCAM, GREM1, HOXB13, KIT, MEN1, MLH1, MSH2, MSH3, MSH6, MUTYH, NBN,   . Headache(784.0)    migraines  . History of blood transfusion 06/2005  . History of gallstones   . History of pneumonia   . History of shingles 07/2011  . HTN (hypertension)   . Normal coronary arteries 2003  . PFO (patent foramen ovale)   . Presence of inferior vena cava filter   . Pulmonary embolism ([employer])   . Sleep apnea    used CPAP until after  bypass surg.  . Status post gastric bypass for obesity   . Stroke [facility]) 12/2002   right-sided weakness  . Urinary incontinence     Past Surgical History:  Procedure Laterality Date  . ABDOMINAL HYSTERECTOMY  11/1997   complete  . ANTERIOR CERVICAL DECOMP/DISCECTOMY FUSION  02/05/2005   C5-6  . APPENDECTOMY  10/22/2008   laparoscopic  . BREAST LUMPECTOMY WITH RADIOACTIVE SEED AND SENTINEL LYMPH NODE BIOPSY Left 07/26/2016   Procedure: BREAST LUMPECTOMY WITH RADIOACTIVE SEED AND SENTINEL LYMPH NODE BIOPSY;  Surgeon: Erroll Luna, MD;  Location: [place];  Service: General;  Laterality: Left;  . BUNIONECTOMY  05/1980   both feet  . BUNIONECTOMY  08/2011   left foot  . CARDIAC CATHETERIZATION  03/04/2002  . CARPAL TUNNEL RELEASE  06/21/2009   right  . CARPAL TUNNEL RELEASE     left hand  . CARPAL TUNNEL RELEASE  10/03/2011   Procedure: CARPAL TUNNEL RELEASE;  Surgeon: Wynonia Sours, MD;  Location: [place];  Service: Orthopedics;  Laterality: Right;  CARPAL TUNNEL WITH HYPOTHENAR FAT PAD TRANSFER  . CERVICAL SPINE SURGERY  01/2005   titanium plate implanted  . CHOLECYSTECTOMY  1990  . ELBOW SURGERY  08/09/2004   decompression ulnar nerve right elbow  . ENTEROLYSIS  10/22/2008   laparoscopic abd. enterolysis  . GASTRIC ROUX-EN-Y  2009  . HEEL SPUR SURGERY  08/1997   left  . HEMORRHOID SURGERY  03/1993  . LAPAROSCOPIC LYSIS INTESTINAL ADHESIONS  02/14/2000  . NAILBED REPAIR  01/10/2005; 08/2011   exc. matrix bilat. great toe  . OTHER SURGICAL HISTORY  12/1986   pt states that she had surgery to unclog her fallopean tubes  . PORTACATH PLACEMENT N/A 09/24/2016   Procedure: INSERTION PORT-A-CATH WITH [name];  Surgeon: Erroll Luna, MD;  Location: [place];  Service: General;  Laterality: N/A;  . SHOULDER SURGERY     bilat. - (left:  06/2005)  . TONSILLECTOMY  07/1995  . TRIGGER FINGER RELEASE  04/25/2006   decompression A-1 pulley left thumb  . UTERINE FIBROID SURGERY   12/95, 7/96   x2  . VENA CAVA FILTER PLACEMENT  2009   during Roux-en-Y surg.    There were no vitals filed for this visit.       [facility] PT Assessment - 07/03/17 0001      Assessment   Medical Diagnosis  Left lumpectomy and SLNB    Onset Date/Surgical Date  04/20/17    Hand Dominance  Right    Prior Therapy  none      Precautions   Precautions  Other (comment)    Precaution Comments  Fall risk; left arm lymphedema risk; undergoing radiation      Restrictions   Weight Bearing Restrictions  No      Home Environment   Living Environment  Private residence    Living Arrangements  Other relatives Sister    Available Help at Discharge  Family      Prior Function   Level of Independence  Independent    Vocation  On disability    Leisure  Not currently exercising but was walking 1 hour/day prior to cancer treatment      Cognition   Overall Cognitive Status  Within Functional Limits for tasks assessed      Posture/Postural Control   Posture/Postural Control  Postural limitations    Postural Limitations  Rounded Shoulders;Forward head      AROM   Right Shoulder Extension  39 Degrees    Right Shoulder Flexion  105 Degrees    Right Shoulder ABduction  103 Degrees    Right Shoulder Internal Rotation  45 Degrees    Right Shoulder External Rotation  65 Degrees    Left Shoulder Extension  25 Degrees    Left Shoulder Flexion  67 Degrees    Left Shoulder ABduction  59 Degrees    Left Shoulder Internal Rotation  62 Degrees    Left Shoulder External Rotation  45 Degrees IR/ER measured with upper arm supported by PT      Palpation   Palpation comment  Tender to palpation left anterior shoulder and lateral deltoid region. Difficult to assess if pain from palpation is due to fibromyalgia as patient was somewhat hypersensitive to light touch.      Special Tests    Special Tests  Rotator Cuff Impingement    Rotator Cuff Impingment tests  Empty Can test      Empty Can test    Findings  Negative    Side  Left        LYMPHEDEMA/ONCOLOGY QUESTIONNAIRE - 07/03/17 0911      Type   Cancer Type  Left breast      Surgeries   Lumpectomy Date  07/26/16    Sentinel  Lymph Node Biopsy Date  07/26/16    Number Lymph Nodes Removed  2      Treatment   Active Chemotherapy Treatment  No    Past Chemotherapy Treatment  Yes    Date  04/19/17    Active Radiation Treatment  Yes    Date  06/11/17    Body Site  left breast    Past Radiation Treatment  No    Current Hormone Treatment  No    Past Hormone Therapy  No      What other symptoms do you have   Are you Having Heaviness or Tightness  Yes    Are you having Pain  Yes    Are you having pitting edema  No    Is it Hard or Difficult finding clothes that fit  No    Do you have infections  No    Is there Decreased scar mobility  No    [place]emmer Sign  No      Lymphedema Assessments   Lymphedema Assessments  Upper extremities      Right Upper Extremity Lymphedema   10 cm Proximal to Olecranon Process  38.3 cm    Olecranon Process  26.5 cm    10 cm Proximal to Ulnar Styloid Process  23.1 cm    Just Proximal to Ulnar Styloid Process  18 cm    Across Hand at [employer]  19.3 cm    At [place] of 2nd Digit  6.5 cm      Left Upper Extremity Lymphedema   10 cm Proximal to Olecranon Process  39 cm    Olecranon Process  26 cm    10 cm Proximal to Ulnar Styloid Process  23.9 cm    Just Proximal to Ulnar Styloid Process  17.9 cm    Across Hand at [employer]  19.2 cm    At [place] of 2nd Digit  6.7 cm          Objective measurements completed on examination: See above findings.              PT Education - 07/03/17 0854    Education provided  Yes    Education Details  HEP    Person(s) Educated  Patient    Methods  Explanation;Demonstration;Handout    Comprehension  Returned demonstration;Verbalized understanding          PT Long Term Goals - 07/03/17 0908      PT LONG TERM GOAL #1   Title   Patient will demonstrate proper technique with her home exercise program to improve shoulder ROM.    Time  1    Period  Days    Status  Achieved             Plan - 07/03/17 0854    Clinical Impression Statement  Patient is s/p a left lumpectomy and sentinel node biopsy and is currently undergoing radiation. She has left upper arm and shoulder pain which has been present for about a month. She has very limited shoulder ROM bilaterally and it is difficult to determine if this is breast cancer related or complicated by her fibromyalgia. She would benefit from PT to improve shoulder ROM and reduce pain but is unwilling for financial reasons to begin therapy at this time.    History and Personal Factors relevant to plan of care:  Previous CVA; fibromyalgia; currently undergoing radiation    Clinical Presentation  Evolving    Clinical Presentation  due to:  Arm pain is not improving and her radiation treatment will continue for another few weeks which may worsen her symptoms.    Clinical Decision Making  Moderate    Rehab Potential  Good    Clinical Impairments Affecting Rehab Potential  Comorbidities    PT Frequency  One time visit Per patient request    PT Treatment/Interventions  ADLs/Self Care Home Management;Therapeutic exercise;Patient/family education    PT Home Exercise Plan  Shoulder ROM HEP    Consulted and Agree with Plan of Care  Patient       Patient will benefit from skilled therapeutic intervention in order to improve the following deficits and impairments:  Pain, Postural dysfunction, Decreased range of motion, Decreased knowledge of precautions, Impaired UE functional use  Visit Diagnosis: Acute pain of left shoulder - Plan: PT plan of care cert/re-cert  Stiffness of left shoulder, not elsewhere classified - Plan: PT plan of care cert/re-cert  Aftercare following surgery for neoplasm - Plan: PT plan of care cert/re-cert  Abnormal posture - Plan: PT plan of care  cert/re-cert     Problem List Patient Active Problem List   Diagnosis Date Noted  . Chemotherapy-induced peripheral neuropathy ([place]) 02/15/2017  . Port catheter in place 11/02/2016  . Genetic testing 09/19/2016  . Pre-operative clearance 07/23/2016  . Malignant neoplasm of lower-outer quadrant of left breast of female, estrogen receptor positive ([place]) 07/10/2016  . Essential hypertension   . Chest pain 09/30/2015  . DVT, lower extremity ([place]) 06/18/2011  . DVT of upper extremity (deep vein thrombosis) ([place]) 06/18/2011  . [place] filter in place 06/18/2011  . History of stroke 06/18/2011  . PFO (patent foramen ovale) 06/18/2011  . Status post gastric bypass for obesity 06/18/2011  . PELVIC PAIN, CHRONIC 07/20/2008  . FIBROIDS, UTERUS 07/16/2008  . ASTHMA 07/16/2008  . FIBROMYALGIA 07/16/2008  . CARPAL TUNNEL SYNDROME, HX OF 07/16/2008  . History of pulmonary embolus ([place]) 07/16/2008    Annia Friendly, PT 07/03/17 9:17 AM  [place], [name], [address] Phone: [phone]   Fax:  [phone]  Name: [name] MRN: [id number] Date of Birth: March 03, 1961

## 2017-07-04 ENCOUNTER — Ambulatory Visit
Admission: RE | Admit: 2017-07-04 | Discharge: 2017-07-04 | Disposition: A | Discharge status: Home / Self Care | Payer: Medicare Other | Source: Ambulatory Visit | Attending: Radiation Oncology | Admitting: Radiation Oncology

## 2017-07-04 DIAGNOSIS — C50312 Malignant neoplasm of lower-inner quadrant of left female breast: Secondary | ICD-10-CM | POA: Diagnosis not present

## 2017-07-04 DIAGNOSIS — C50512 Malignant neoplasm of lower-outer quadrant of left female breast: Secondary | ICD-10-CM | POA: Diagnosis not present

## 2017-07-05 ENCOUNTER — Ambulatory Visit
Admission: RE | Admit: 2017-07-05 | Discharge: 2017-07-05 | Disposition: A | Discharge status: Home / Self Care | Payer: Medicare Other | Source: Ambulatory Visit | Attending: Radiation Oncology | Admitting: Radiation Oncology

## 2017-07-05 DIAGNOSIS — C50312 Malignant neoplasm of lower-inner quadrant of left female breast: Secondary | ICD-10-CM | POA: Diagnosis not present

## 2017-07-05 DIAGNOSIS — C50512 Malignant neoplasm of lower-outer quadrant of left female breast: Secondary | ICD-10-CM | POA: Diagnosis not present

## 2017-07-08 ENCOUNTER — Ambulatory Visit
Admission: RE | Admit: 2017-07-08 | Discharge: 2017-07-08 | Disposition: A | Discharge status: Home / Self Care | Payer: Medicare Other | Source: Ambulatory Visit | Attending: Radiation Oncology | Admitting: Radiation Oncology

## 2017-07-08 DIAGNOSIS — C50512 Malignant neoplasm of lower-outer quadrant of left female breast: Secondary | ICD-10-CM | POA: Diagnosis not present

## 2017-07-08 DIAGNOSIS — C50312 Malignant neoplasm of lower-inner quadrant of left female breast: Secondary | ICD-10-CM | POA: Diagnosis not present

## 2017-07-09 ENCOUNTER — Ambulatory Visit
Admission: RE | Admit: 2017-07-09 | Discharge: 2017-07-09 | Disposition: A | Discharge status: Home / Self Care | Payer: Medicare Other | Source: Ambulatory Visit | Attending: Radiation Oncology | Admitting: Radiation Oncology

## 2017-07-09 DIAGNOSIS — C50312 Malignant neoplasm of lower-inner quadrant of left female breast: Secondary | ICD-10-CM | POA: Diagnosis not present

## 2017-07-09 DIAGNOSIS — C50512 Malignant neoplasm of lower-outer quadrant of left female breast: Secondary | ICD-10-CM | POA: Diagnosis not present

## 2017-07-10 ENCOUNTER — Ambulatory Visit
Admission: RE | Admit: 2017-07-10 | Discharge: 2017-07-10 | Disposition: A | Discharge status: Home / Self Care | Payer: Medicare Other | Source: Ambulatory Visit | Attending: Radiation Oncology | Admitting: Radiation Oncology

## 2017-07-10 DIAGNOSIS — C50512 Malignant neoplasm of lower-outer quadrant of left female breast: Secondary | ICD-10-CM | POA: Diagnosis not present

## 2017-07-10 DIAGNOSIS — C50312 Malignant neoplasm of lower-inner quadrant of left female breast: Secondary | ICD-10-CM | POA: Diagnosis not present

## 2017-07-11 ENCOUNTER — Ambulatory Visit
Admission: RE | Admit: 2017-07-11 | Discharge: 2017-07-11 | Disposition: A | Discharge status: Home / Self Care | Payer: Medicare Other | Source: Ambulatory Visit | Attending: Radiation Oncology | Admitting: Radiation Oncology

## 2017-07-11 DIAGNOSIS — C50312 Malignant neoplasm of lower-inner quadrant of left female breast: Secondary | ICD-10-CM | POA: Diagnosis not present

## 2017-07-11 DIAGNOSIS — C50512 Malignant neoplasm of lower-outer quadrant of left female breast: Secondary | ICD-10-CM | POA: Diagnosis not present

## 2017-07-12 ENCOUNTER — Ambulatory Visit: Payer: Medicare Other

## 2017-07-15 ENCOUNTER — Ambulatory Visit
Admission: RE | Admit: 2017-07-15 | Discharge: 2017-07-15 | Disposition: A | Discharge status: Home / Self Care | Payer: Medicare Other | Source: Ambulatory Visit | Attending: Radiation Oncology | Admitting: Radiation Oncology

## 2017-07-15 DIAGNOSIS — C50312 Malignant neoplasm of lower-inner quadrant of left female breast: Secondary | ICD-10-CM | POA: Diagnosis not present

## 2017-07-15 DIAGNOSIS — C50512 Malignant neoplasm of lower-outer quadrant of left female breast: Secondary | ICD-10-CM | POA: Diagnosis not present

## 2017-07-16 ENCOUNTER — Ambulatory Visit
Admission: RE | Admit: 2017-07-16 | Discharge: 2017-07-16 | Disposition: A | Discharge status: Home / Self Care | Payer: Medicare Other | Source: Ambulatory Visit | Attending: Radiation Oncology | Admitting: Radiation Oncology

## 2017-07-16 DIAGNOSIS — C50312 Malignant neoplasm of lower-inner quadrant of left female breast: Secondary | ICD-10-CM | POA: Diagnosis not present

## 2017-07-16 DIAGNOSIS — C50512 Malignant neoplasm of lower-outer quadrant of left female breast: Secondary | ICD-10-CM | POA: Diagnosis not present

## 2017-07-17 ENCOUNTER — Ambulatory Visit
Admission: RE | Admit: 2017-07-17 | Discharge: 2017-07-17 | Disposition: A | Discharge status: Home / Self Care | Payer: Medicare Other | Source: Ambulatory Visit | Attending: Radiation Oncology | Admitting: Radiation Oncology

## 2017-07-17 DIAGNOSIS — C50512 Malignant neoplasm of lower-outer quadrant of left female breast: Secondary | ICD-10-CM | POA: Diagnosis not present

## 2017-07-17 DIAGNOSIS — C50312 Malignant neoplasm of lower-inner quadrant of left female breast: Secondary | ICD-10-CM | POA: Diagnosis not present

## 2017-07-18 ENCOUNTER — Ambulatory Visit
Admission: RE | Admit: 2017-07-18 | Discharge: 2017-07-18 | Disposition: A | Discharge status: Home / Self Care | Payer: Medicare Other | Source: Ambulatory Visit | Attending: Radiation Oncology | Admitting: Radiation Oncology

## 2017-07-18 DIAGNOSIS — C50312 Malignant neoplasm of lower-inner quadrant of left female breast: Secondary | ICD-10-CM | POA: Diagnosis not present

## 2017-07-18 DIAGNOSIS — C50512 Malignant neoplasm of lower-outer quadrant of left female breast: Secondary | ICD-10-CM | POA: Diagnosis not present

## 2017-07-19 ENCOUNTER — Ambulatory Visit
Admission: RE | Admit: 2017-07-19 | Discharge: 2017-07-19 | Disposition: A | Discharge status: Home / Self Care | Payer: Medicare Other | Source: Ambulatory Visit | Attending: Radiation Oncology | Admitting: Radiation Oncology

## 2017-07-19 ENCOUNTER — Ambulatory Visit: Payer: Medicare Other

## 2017-07-19 DIAGNOSIS — Z510 Encounter for antineoplastic radiation therapy: Secondary | ICD-10-CM | POA: Insufficient documentation

## 2017-07-19 DIAGNOSIS — C50512 Malignant neoplasm of lower-outer quadrant of left female breast: Secondary | ICD-10-CM | POA: Insufficient documentation

## 2017-07-19 DIAGNOSIS — Z170 Estrogen receptor positive status [ER+]: Secondary | ICD-10-CM | POA: Insufficient documentation

## 2017-07-19 MED FILL — diazePAM 10 MG TABS: 10 | 1 days supply | Qty: 1 | Fill #0

## 2017-07-22 ENCOUNTER — Ambulatory Visit
Admission: RE | Admit: 2017-07-22 | Discharge: 2017-07-22 | Disposition: A | Discharge status: Home / Self Care | Payer: Medicare Other | Source: Ambulatory Visit | Attending: Radiation Oncology | Admitting: Radiation Oncology

## 2017-07-22 ENCOUNTER — Ambulatory Visit: Payer: Medicare Other

## 2017-07-22 DIAGNOSIS — C50512 Malignant neoplasm of lower-outer quadrant of left female breast: Secondary | ICD-10-CM | POA: Diagnosis not present

## 2017-07-23 ENCOUNTER — Ambulatory Visit
Admission: RE | Admit: 2017-07-23 | Discharge: 2017-07-23 | Disposition: A | Discharge status: Home / Self Care | Payer: Medicare Other | Source: Ambulatory Visit | Attending: Radiation Oncology | Admitting: Radiation Oncology

## 2017-07-23 DIAGNOSIS — C50512 Malignant neoplasm of lower-outer quadrant of left female breast: Secondary | ICD-10-CM | POA: Diagnosis not present

## 2017-07-24 ENCOUNTER — Ambulatory Visit
Admission: RE | Admit: 2017-07-24 | Discharge: 2017-07-24 | Disposition: A | Discharge status: Home / Self Care | Payer: Medicare Other | Source: Ambulatory Visit | Attending: Radiation Oncology | Admitting: Radiation Oncology

## 2017-07-24 ENCOUNTER — Inpatient Hospital Stay: Payer: Medicare Other | Attending: Hematology and Oncology | Admitting: Adult Health

## 2017-07-24 VITALS — BP 120/76 | HR 73 | Temp 98.2°F | Resp 20 | Ht 61.0 in | Wt 203.7 lb

## 2017-07-24 DIAGNOSIS — Z87891 Personal history of nicotine dependence: Secondary | ICD-10-CM | POA: Insufficient documentation

## 2017-07-24 DIAGNOSIS — M797 Fibromyalgia: Secondary | ICD-10-CM | POA: Insufficient documentation

## 2017-07-24 DIAGNOSIS — Z79899 Other long term (current) drug therapy: Secondary | ICD-10-CM | POA: Diagnosis not present

## 2017-07-24 DIAGNOSIS — Z86718 Personal history of other venous thrombosis and embolism: Secondary | ICD-10-CM | POA: Insufficient documentation

## 2017-07-24 DIAGNOSIS — Z86711 Personal history of pulmonary embolism: Secondary | ICD-10-CM | POA: Insufficient documentation

## 2017-07-24 DIAGNOSIS — Z9884 Bariatric surgery status: Secondary | ICD-10-CM | POA: Diagnosis not present

## 2017-07-24 DIAGNOSIS — R51 Headache: Secondary | ICD-10-CM | POA: Diagnosis not present

## 2017-07-24 DIAGNOSIS — Z9221 Personal history of antineoplastic chemotherapy: Secondary | ICD-10-CM | POA: Diagnosis not present

## 2017-07-24 DIAGNOSIS — Z923 Personal history of irradiation: Secondary | ICD-10-CM | POA: Diagnosis not present

## 2017-07-24 DIAGNOSIS — Z8673 Personal history of transient ischemic attack (TIA), and cerebral infarction without residual deficits: Secondary | ICD-10-CM | POA: Diagnosis not present

## 2017-07-24 DIAGNOSIS — Z170 Estrogen receptor positive status [ER+]: Secondary | ICD-10-CM | POA: Insufficient documentation

## 2017-07-24 DIAGNOSIS — J45909 Unspecified asthma, uncomplicated: Secondary | ICD-10-CM | POA: Insufficient documentation

## 2017-07-24 DIAGNOSIS — I10 Essential (primary) hypertension: Secondary | ICD-10-CM | POA: Insufficient documentation

## 2017-07-24 DIAGNOSIS — C50512 Malignant neoplasm of lower-outer quadrant of left female breast: Secondary | ICD-10-CM | POA: Insufficient documentation

## 2017-07-24 DIAGNOSIS — M542 Cervicalgia: Secondary | ICD-10-CM | POA: Diagnosis not present

## 2017-07-24 DIAGNOSIS — R42 Dizziness and giddiness: Secondary | ICD-10-CM | POA: Diagnosis not present

## 2017-07-24 DIAGNOSIS — M1990 Unspecified osteoarthritis, unspecified site: Secondary | ICD-10-CM | POA: Insufficient documentation

## 2017-07-24 NOTE — Progress Notes (Addendum)
[facility] Follow up:    Alicia Frees, MD [address] [place] [address]   DIAGNOSIS: Cancer Staging Malignant neoplasm of lower-outer quadrant of left breast of female, estrogen receptor positive ([place]) Staging form: Breast, AJCC 8th Edition - Clinical: Stage IA (cT1c, cN0(sn), cM0, G3, ER: Positive, PR: Positive, HER2: Negative, Oncotype DX score: 51) - Unsigned   SUMMARY OF ONCOLOGIC HISTORY:   Malignant neoplasm of lower-outer quadrant of left breast of female, estrogen receptor positive ([place])   06/26/2016 Initial Diagnosis    Left breast biopsy 3:00: IDC with DCIS, grade 3, ER 80%, PR 20%, Ki-67 60%, HER-2 negative ratio 1.18; palpable lump: 1.8 cm lesion, no lymph nodes, T1 cN0 stage I a clinical stage      07/26/2016 Surgery    Left lumpectomy: IDC 1.8 cm, with DCIS, margins negative, 0/2 lymph nodes, ER 80%, PR 20%, HER-2 negative ratio 1.18, Ki-67 60%, T1cN0 stage IA       08/21/2016 Oncotype testing    Oncotype DX recurrence score 51, risk of recurrence 34%      08/21/2016 Genetic Testing    Genetic counseling and testing for hereditary cancer syndromes performed on 08/21/2016. Results are negative for pathogenic mutations in 46 genes analyzed by [facility]'s Common Hereditary Cancers Panel. Results are dated 09/14/2016. Genes tested: APC, ATM, AXIN2, BARD1, BMPR1A, BRCA1, BRCA2, BRIP1, CDH1, CDKN2A, CHEK2, CTNNA1, DICER1, EPCAM, GREM1, HOXB13, KIT, MEN1, MLH1, MSH2, MSH3, MSH6, MUTYH, NBN, NF1, NTHL1, PALB2, PDGFRA, PMS2, POLD1, POLE, PTEN, RAD50, RAD51C, RAD51D, SDHA, SDHB, SDHC, SDHD, SMAD4, SMARCA4, STK11, TP53, TSC1, TSC2, and VHL.        11/02/2016 - 04/19/2017 Chemotherapy    Dose dense Adriamycin and Cytoxan 4 followed by Taxol weekly 12      06/10/2017 -  Radiation Therapy    Adjuvant radiation with Dr. Lisbeth Renshaw       CURRENT THERAPY: Radiation  INTERVAL HISTORY: [name] 57 y.o. female returns for evaluation.   She is currently undergoing radiation.  She is fatigued.  She has some muscle pain and bone pain.  She has generalized bone pain.  She also has pain in her left humerus, hips and knees.  She undergone her MRI on 06/20/2017 that found greater trochanteric bursitis with soft tissue inflammation.  She notes continues peripheral neuropathy.  She describes it more as pain than tingling.  She occasionally feels like spiders are crawling on her right feet.  She has also noted an increase in headaches that are worsening over the past few weeks.  She is taking Gabapetin, Flexeril, Motrin, in addition to back patches (Salonpas) and biofreeze.  She will even utilize vicks vapor rub.  She was given a prescription for Letrozole, however has not yet started taking it.     Patient Active Problem List   Diagnosis Date Noted  . Chemotherapy-induced peripheral neuropathy ([place]) 02/15/2017  . Port catheter in place 11/02/2016  . Genetic testing 09/19/2016  . Pre-operative clearance 07/23/2016  . Malignant neoplasm of lower-outer quadrant of left breast of female, estrogen receptor positive ([place]) 07/10/2016  . Essential hypertension   . Chest pain 09/30/2015  . DVT, lower extremity ([place]) 06/18/2011  . DVT of upper extremity (deep vein thrombosis) ([place]) 06/18/2011  . [place] filter in place 06/18/2011  . History of stroke 06/18/2011  . PFO (patent foramen ovale) 06/18/2011  . Status post gastric bypass for obesity 06/18/2011  . PELVIC PAIN, CHRONIC 07/20/2008  . FIBROIDS, UTERUS 07/16/2008  .  ASTHMA 07/16/2008  . FIBROMYALGIA 07/16/2008  . CARPAL TUNNEL SYNDROME, HX OF 07/16/2008  . History of pulmonary embolus (PE) 07/16/2008    is allergic to aspirin; oxycodone hcl; propoxyphene n-acetaminophen; tramadol; adhesive [tape]; and prednisone.  MEDICAL HISTORY: Past Medical History:  Diagnosis Date  . Anemia    since bypass  . Arthritis    osteoarthritis  . Asthma    states no asthma attack since 2002   . Breast cancer ([place]) 06/26/16 bx   left breast  . Chronic back pain   . Complication of anesthesia    states takes more than normal to put her to sleep  . Dental bridge present    upper  . Dental crowns present   . DVT of upper extremity (deep vein thrombosis) ([place])   . Fibromyalgia   . Genetic testing 09/19/2016   [name] underwent genetic counseling and testing for hereditary cancer syndromes on 08/21/2016. Her results were negative for mutations in all 46 genes analyzed by [facility]'s 46-gene Common Hereditary Cancers Panel. Genes analyzed include: APC, ATM, AXIN2, BARD1, BMPR1A, BRCA1, BRCA2, BRIP1, CDH1, CDKN2A, CHEK2, CTNNA1, DICER1, EPCAM, GREM1, HOXB13, KIT, MEN1, MLH1, MSH2, MSH3, MSH6, MUTYH, NBN,   . Headache(784.0)    migraines  . History of blood transfusion 06/2005  . History of gallstones   . History of pneumonia   . History of shingles 07/2011  . HTN (hypertension)   . Normal coronary arteries 2003  . PFO (patent foramen ovale)   . Presence of inferior vena cava filter   . Pulmonary embolism ([place])   . Sleep apnea    used CPAP until after bypass surg.  . Status post gastric bypass for obesity   . Stroke [facility]) 12/2002   right-sided weakness  . Urinary incontinence     SURGICAL HISTORY: Past Surgical History:  Procedure Laterality Date  . ABDOMINAL HYSTERECTOMY  11/1997   complete  . ANTERIOR CERVICAL DECOMP/DISCECTOMY FUSION  02/05/2005   C5-6  . APPENDECTOMY  10/22/2008   laparoscopic  . BREAST LUMPECTOMY WITH RADIOACTIVE SEED AND SENTINEL LYMPH NODE BIOPSY Left 07/26/2016   Procedure: BREAST LUMPECTOMY WITH RADIOACTIVE SEED AND SENTINEL LYMPH NODE BIOPSY;  Surgeon: Erroll Luna, MD;  Location: [facility];  Service: General;  Laterality: Left;  . BUNIONECTOMY  05/1980   both feet  . BUNIONECTOMY  08/2011   left foot  . CARDIAC CATHETERIZATION  03/04/2002  . CARPAL TUNNEL RELEASE  06/21/2009   right  . CARPAL TUNNEL RELEASE     left hand  . CARPAL  TUNNEL RELEASE  10/03/2011   Procedure: CARPAL TUNNEL RELEASE;  Surgeon: Wynonia Sours, MD;  Location: [facility];  Service: Orthopedics;  Laterality: Right;  CARPAL TUNNEL WITH HYPOTHENAR FAT PAD TRANSFER  . CERVICAL SPINE SURGERY  01/2005   titanium plate implanted  . CHOLECYSTECTOMY  1990  . ELBOW SURGERY  08/09/2004   decompression ulnar nerve right elbow  . ENTEROLYSIS  10/22/2008   laparoscopic abd. enterolysis  . GASTRIC ROUX-EN-Y  2009  . HEEL SPUR SURGERY  08/1997   left  . HEMORRHOID SURGERY  03/1993  . LAPAROSCOPIC LYSIS INTESTINAL ADHESIONS  02/14/2000  . NAILBED REPAIR  01/10/2005; 08/2011   exc. matrix bilat. great toe  . OTHER SURGICAL HISTORY  12/1986   pt states that she had surgery to unclog her fallopean tubes  . PORTACATH PLACEMENT N/A 09/24/2016   Procedure: INSERTION PORT-A-CATH WITH [name];  Surgeon: Erroll Luna, MD;  Location:  [facility] OR;  Service: General;  Laterality: N/A;  . SHOULDER SURGERY     bilat. - (left:  06/2005)  . TONSILLECTOMY  07/1995  . TRIGGER FINGER RELEASE  04/25/2006   decompression A-1 pulley left thumb  . UTERINE FIBROID SURGERY  12/95, 7/96   x2  . VENA CAVA FILTER PLACEMENT  2009   during Roux-en-Y surg.    SOCIAL HISTORY: Social History   Socioeconomic History  . Marital status: Single    Spouse name: Not on file  . Number of children: 0  . Years of education: Not on file  . Highest education level: Not on file  Social Needs  . Financial resource strain: Not on file  . Food insecurity - worry: Not on file  . Food insecurity - inability: Not on file  . Transportation needs - medical: Not on file  . Transportation needs - non-medical: Not on file  Occupational History  . Occupation: disabled  Tobacco Use  . Smoking status: Former Research scientist (life sciences)  . Smokeless tobacco: Never Used  . Tobacco comment: quit smoking 08/1989  Substance and Sexual Activity  . Alcohol use: No  . Drug use: No  . Sexual activity: No    Birth  control/protection: Surgical  Other Topics Concern  . Not on file  Social History Narrative  . Not on file    FAMILY HISTORY: Family History  Problem Relation Age of Onset  . Breast cancer Paternal Aunt 41  . Cervical cancer Paternal Grandmother 28       d.40s  . Ovarian cancer Maternal Grandmother 23       d.23  . Colon polyps Father   . Diabetes Father        borderline  . Prostate cancer Father   . Hypertension Mother   . Hypertension Unknown   . Breast cancer Sister 26       treated with neoadjuvant chemo/radiation and lumpectomy    Review of Systems  Constitutional: Positive for fatigue. Negative for appetite change, chills, fever and unexpected weight change.  HENT:   Negative for hearing loss, lump/mass and trouble swallowing.   Eyes: Negative for eye problems and icterus.  Respiratory: Negative for chest tightness, cough and shortness of breath.   Cardiovascular: Negative for chest pain, leg swelling and palpitations.  Gastrointestinal: Negative for abdominal distention, abdominal pain, diarrhea, nausea and vomiting.  Endocrine: Negative for hot flashes.  Musculoskeletal: Positive for arthralgias and back pain.  Skin: Negative for itching and rash.  Neurological: Positive for headaches and numbness. Negative for dizziness and extremity weakness.  Hematological: Negative for adenopathy. Does not bruise/bleed easily.  Psychiatric/Behavioral: Negative for depression. The patient is not nervous/anxious.       PHYSICAL EXAMINATION  ECOG PERFORMANCE STATUS: 2 - Symptomatic, <50% confined to bed  Vitals:   07/24/17 1453  BP: 120/76  Pulse: 73  Resp: 20  Temp: 98.2 F (36.8 C)  SpO2: 100%    Physical Exam  Constitutional: She is oriented to person, place, and time and well-developed, well-nourished, and in no distress.  HENT:  Head: Normocephalic and atraumatic.  Mouth/Throat: No oropharyngeal exudate.  Eyes: Pupils are equal, round, and reactive to light.  No scleral icterus.  Neck: Neck supple.  Cardiovascular: Normal rate, regular rhythm and normal heart sounds.  Pulmonary/Chest: Effort normal and breath sounds normal. No respiratory distress. She has no wheezes. She has no rales. She exhibits no tenderness.  Abdominal: Soft. Bowel sounds are normal. She exhibits no distension and  no mass. There is no tenderness. There is no rebound and no guarding.  Musculoskeletal: She exhibits no edema.  Lymphadenopathy:    She has no cervical adenopathy.  Neurological: She is alert and oriented to person, place, and time.  Skin: Skin is warm and dry. No rash noted.  Psychiatric: Mood and affect normal.    LABORATORY DATA:  CBC    Component Value Date/Time   WBC 2.5 (L) 06/14/2017 1355   WBC 3.5 (L) 04/19/2017 1005   WBC 7.5 12/14/2016 0120   RBC 3.44 (L) 06/14/2017 1355   HGB 10.2 (L) 04/19/2017 1005   HCT 33.1 (L) 06/14/2017 1355   HCT 31.1 (L) 04/19/2017 1005   PLT 188 06/14/2017 1355   PLT 184 04/19/2017 1005   MCV 96.4 06/14/2017 1355   MCV 97.5 04/19/2017 1005   MCH 31.8 06/14/2017 1355   MCHC 33.0 06/14/2017 1355   RDW 14.7 06/14/2017 1355   RDW 15.3 (H) 04/19/2017 1005   LYMPHSABS 0.8 (L) 06/14/2017 1355   LYMPHSABS 1.0 04/19/2017 1005   MONOABS 0.3 06/14/2017 1355   MONOABS 0.3 04/19/2017 1005   EOSABS 0.1 06/14/2017 1355   EOSABS 0.1 04/19/2017 1005   BASOSABS 0.0 06/14/2017 1355   BASOSABS 0.0 04/19/2017 1005    CMP     Component Value Date/Time   NA 138 06/14/2017 1355   NA 138 04/19/2017 1005   K 4.1 06/14/2017 1355   K 4.0 04/19/2017 1005   CL 107 06/14/2017 1355   CO2 24 06/14/2017 1355   CO2 25 04/19/2017 1005   GLUCOSE 97 06/14/2017 1355   GLUCOSE 87 04/19/2017 1005   BUN 11 06/14/2017 1355   BUN 12.0 04/19/2017 1005   CREATININE 0.67 06/14/2017 1355   CREATININE 0.7 04/19/2017 1005   CALCIUM 9.0 06/14/2017 1355   CALCIUM 8.9 04/19/2017 1005   PROT 7.0 06/14/2017 1355   PROT 6.7 04/19/2017 1005    ALBUMIN 4.0 06/14/2017 1355   ALBUMIN 3.9 04/19/2017 1005   AST 16 06/14/2017 1355   AST 14 04/19/2017 1005   ALT 9 06/14/2017 1355   ALT 11 04/19/2017 1005   ALKPHOS 67 06/14/2017 1355   ALKPHOS 50 04/19/2017 1005   BILITOT 0.4 06/14/2017 1355   BILITOT 0.31 04/19/2017 1005   GFRNONAA >60 06/14/2017 1355   GFRAA >60 06/14/2017 1355        ASSESSMENT and PLAN:   Malignant neoplasm of lower-outer quadrant of left breast of female, estrogen receptor positive ([place]) 07/26/2016: Left lumpectomy: IDC 1.8 cm, with DCIS, margins negative, 0/2 lymph nodes, ER 80%, PR 20%, HER-2 negative ratio 1.18, Ki-67 60%, T1cN0 stage IA  Oncotype DX score 51: 10 year risk of recurrence greater than 34%, extremely high risk  Recommendation: 1. Adjuvant chemotherapy with Adriamycin/Cytoxan x 4, then weekly Taxol x 12 2. Adjuvant radiation therapy completed on 07/26/2017 3. Adjuvant antiestrogen therapy --------------------------------------------------------------------------------------------------------------------------------   Alicia continues to have discomfort.  Due to this discomfort she met with Dr. Lindi Adie and they came up with the following plan.  She will take Turmeric 59m BID.  She will take Mobic 7.542mpo bid.  She will drink Tonic Water TID (contains quinine for muscle relaxant properties).  If her pain is unimproved in 1 month we will scan with CT C/A/P and bone scan.     All questions were answered. The patient knows to call the clinic with any problems, questions or concerns. We can certainly see the patient much sooner if necessary.  A  total of (30) minutes of face-to-face time was spent with this patient with greater than 50% of that time in counseling and care-coordination.  This note was electronically signed. Scot Dock, NP 07/24/2017   Attending Note  I personally saw and examined [name]. The plan of care was discussed with her. I agree with the  assessment and plan as documented above. Excruciating diffuse body aches and pains: I suspect this is due to generalized inflammation.  I gave her a prescription for Mobic.  She will take it twice a day.  I instructed her to take turmeric 500 mg twice a day.  I also talked to her about taking tonic water 3 times a day to help alleviate the muscle cramps and achiness.  She would not start letrozole therapy until musculoskeletal symptoms improve. We will see her back in a month.  If she does not have improvement of her symptoms then we may have to do a CT chest abdomen pelvis and bone scan. Signed Harriette Ohara, MD

## 2017-07-24 NOTE — Assessment & Plan Note (Addendum)
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 Adjuvant chemotherapy with Adriamycin/Cytoxan x 4, then weekly Taxol x 12 2. Adjuvant radiation therapy completed on 07/26/2017 3. Adjuvant antiestrogen therapy --------------------------------------------------------------------------------------------------------------------------------   [name] continues to have discomfort.  Due to this discomfort she met with Dr. Lindi Adie and they came up with the following plan.  She will take Turmeric 590m BID.  She will take Mobic 7.548mpo bid.  She will drink Tonic Water TID (contains quinine for muscle relaxant properties).  If her pain is unimproved in 1 month we will scan with CT C/A/P and bone scan.

## 2017-07-25 ENCOUNTER — Ambulatory Visit
Admission: RE | Admit: 2017-07-25 | Discharge: 2017-07-25 | Disposition: A | Discharge status: Home / Self Care | Payer: Medicare Other | Source: Ambulatory Visit | Attending: Radiation Oncology | Admitting: Radiation Oncology

## 2017-07-25 ENCOUNTER — Ambulatory Visit: Payer: Medicare Other

## 2017-07-25 ENCOUNTER — Other Ambulatory Visit: Payer: Self-pay

## 2017-07-25 DIAGNOSIS — C50512 Malignant neoplasm of lower-outer quadrant of left female breast: Secondary | ICD-10-CM | POA: Diagnosis not present

## 2017-07-25 MED ORDER — MELOXICAM 7.5 MG PO TABS: 7.5000 mg | ORAL_TABLET | Freq: Two times a day (BID) | ORAL | 0 refills | Status: DC

## 2017-07-25 MED FILL — MELOXICAM 7.5 MG TABLET: 7.5 | 30 days supply | Qty: 60 | Fill #0

## 2017-07-25 NOTE — Telephone Encounter (Signed)
Returned pt call to verify where she wanted script sent. Sent to [facility]. Patient aware.  Cyndia Bent RN

## 2017-07-26 ENCOUNTER — Encounter: Payer: Self-pay | Admitting: Radiation Oncology

## 2017-07-26 ENCOUNTER — Ambulatory Visit
Admission: RE | Admit: 2017-07-26 | Discharge: 2017-07-26 | Disposition: A | Discharge status: Home / Self Care | Payer: Medicare Other | Source: Ambulatory Visit | Attending: Radiation Oncology | Admitting: Radiation Oncology

## 2017-07-26 DIAGNOSIS — C50512 Malignant neoplasm of lower-outer quadrant of left female breast: Secondary | ICD-10-CM | POA: Diagnosis not present

## 2017-07-30 DIAGNOSIS — M47816 Spondylosis without myelopathy or radiculopathy, lumbar region: Secondary | ICD-10-CM | POA: Diagnosis not present

## 2017-08-08 ENCOUNTER — Telehealth: Payer: Self-pay | Admitting: *Deleted

## 2017-08-08 MED FILL — GABAPENTIN 300 MG CAPSULE: 300 | 30 days supply | Qty: 90 | Fill #0

## 2017-08-08 MED FILL — CYCLOBENZAPRINE 5 MG TABLET: 5 | 30 days supply | Qty: 90 | Fill #1

## 2017-08-08 NOTE — Telephone Encounter (Signed)
"  Headaches and right arm sore under the arm over the past week.  Family recommended I call, just in case.  No fever.  Dull headache over my entire head but mostly above the forehead.  Both ears hurt as well.  My entire head is tender all over when I touch it.  Rate pain as two to three on the pain scale, goes up to eight, then I take Tylenol 1000 mg.  Also use Gabapentin and Mobic is new.  No fluctuations with my B/P.  B/P stable = 120/89 with Lisinopril.  Vision is fine with floaters.  Right armpit feels normal to touch, no lumps, It feels like a cramping sensation.  Return number [phone]."

## 2017-08-12 ENCOUNTER — Inpatient Hospital Stay (HOSPITAL_BASED_OUTPATIENT_CLINIC_OR_DEPARTMENT_OTHER): Payer: Medicare Other | Admitting: Medical

## 2017-08-12 ENCOUNTER — Inpatient Hospital Stay: Payer: Medicare Other

## 2017-08-12 VITALS — BP 129/74 | HR 69 | Temp 98.2°F | Resp 18 | Ht 61.0 in | Wt 210.2 lb

## 2017-08-12 DIAGNOSIS — Z9221 Personal history of antineoplastic chemotherapy: Secondary | ICD-10-CM | POA: Diagnosis not present

## 2017-08-12 DIAGNOSIS — Z79899 Other long term (current) drug therapy: Secondary | ICD-10-CM | POA: Diagnosis not present

## 2017-08-12 DIAGNOSIS — R51 Headache: Secondary | ICD-10-CM | POA: Diagnosis not present

## 2017-08-12 DIAGNOSIS — Z923 Personal history of irradiation: Secondary | ICD-10-CM | POA: Diagnosis not present

## 2017-08-12 DIAGNOSIS — M542 Cervicalgia: Secondary | ICD-10-CM

## 2017-08-12 DIAGNOSIS — C50512 Malignant neoplasm of lower-outer quadrant of left female breast: Secondary | ICD-10-CM

## 2017-08-12 DIAGNOSIS — R42 Dizziness and giddiness: Secondary | ICD-10-CM

## 2017-08-12 DIAGNOSIS — R519 Headache, unspecified: Secondary | ICD-10-CM

## 2017-08-12 DIAGNOSIS — Z170 Estrogen receptor positive status [ER+]: Secondary | ICD-10-CM

## 2017-08-12 LAB — CBC WITH DIFFERENTIAL (CANCER CENTER ONLY)
Basophils Absolute: 0 10*3/uL (ref 0.0–0.1)
Basophils Relative: 1 %
Eosinophils Absolute: 0.1 10*3/uL (ref 0.0–0.5)
Eosinophils Relative: 3 %
HCT: 32 % — ABNORMAL LOW (ref 34.8–46.6)
Hemoglobin: 10.5 g/dL — ABNORMAL LOW (ref 11.6–15.9)
Lymphocytes Relative: 30 %
Lymphs Abs: 0.9 10*3/uL (ref 0.9–3.3)
MCH: 31.3 pg (ref 25.1–34.0)
MCHC: 32.8 g/dL (ref 31.5–36.0)
MCV: 95.5 fL (ref 79.5–101.0)
Monocytes Absolute: 0.4 10*3/uL (ref 0.1–0.9)
Monocytes Relative: 12 %
Neutro Abs: 1.7 10*3/uL (ref 1.5–6.5)
Neutrophils Relative %: 54 %
Platelet Count: 175 10*3/uL (ref 145–400)
RBC: 3.35 MIL/uL — ABNORMAL LOW (ref 3.70–5.45)
RDW: 13.9 % (ref 11.2–14.5)
WBC Count: 3.1 10*3/uL — ABNORMAL LOW (ref 3.9–10.3)

## 2017-08-12 LAB — CMP (CANCER CENTER ONLY)
ALT: 11 U/L (ref 0–55)
AST: 15 U/L (ref 5–34)
Albumin: 3.6 g/dL (ref 3.5–5.0)
Alkaline Phosphatase: 67 U/L (ref 40–150)
Anion gap: 6 (ref 3–11)
BUN: 11 mg/dL (ref 7–26)
CO2: 24 mmol/L (ref 22–29)
Calcium: 8.6 mg/dL (ref 8.4–10.4)
Chloride: 110 mmol/L — ABNORMAL HIGH (ref 98–109)
Creatinine: 0.63 mg/dL (ref 0.60–1.10)
GFR, Est AFR Am: >60 mL/min (ref >60)
GFR, Estimated: >60 mL/min (ref >60)
Glucose, Bld: 93 mg/dL (ref 70–140)
Potassium: 3.8 mmol/L (ref 3.5–5.1)
Sodium: 140 mmol/L (ref 136–145)
Total Bilirubin: 0.4 mg/dL (ref 0.2–1.2)
Total Protein: 6.4 g/dL (ref 6.4–8.3)

## 2017-08-12 MED ORDER — MECLIZINE HCL 25 MG PO TABS: 25.0000 mg | ORAL_TABLET | Freq: Three times a day (TID) | ORAL | 0 refills | Status: DC | PRN

## 2017-08-12 MED FILL — MECLIZINE 25 MG TABLET: 25 | 10 days supply | Qty: 30 | Fill #0

## 2017-08-12 NOTE — Telephone Encounter (Signed)
This RN returned call to pt to discuss concerns and possible need to be evaluated by her primary MD. [name] states she has not seen her primary MD since she has been diagnosed with breast cancer " and then every time I come in to your office I am told to call with my concerns and when I do I really don't get help or answers as to why I am feeling the way I am " - " just told it's probably not related to my cancer "  " I guess I will just wait until I see Dr Lindi Adie next week ( scheduled 4/10) and I can tell him "  This RN discussed symptoms as noted with Triage nurse - which overall seemed to be more related to BP concerns- which has been monitored in the past by her primary MD prior to diagnosis.  Due to pt recently completing radiation - and her concerns appointment made for [facility] for today.

## 2017-08-13 NOTE — Progress Notes (Signed)
[facility] Progress Note   [name] [id number] 08-12-1960 57 y.o.  [name] is managed by Dr. Nicholas Lose  Actively treated with chemotherapy: no  Assessment: Plan:    Vertigo - Plan: meclizine (ANTIVERT) 25 MG tablet, CBC with Differential ([place] Only), CMP ([place] only)  Nonintractable headache, unspecified chronicity pattern, unspecified headache type - Plan: CBC with Differential ([place] Only), CMP ([place] only), CT Head W Wo Contrast  Neck pain on right side - Plan: CT Soft Tissue Neck W Contrast   Vertigo: A CBC and chemistry panel were collected today.  These returned stable with out evidence of progressive anemia.  Patient was given a prescription for meclizine 25 mg p.o. 3 times daily as needed for vertigo.  New onset headache: Patient was referred for a head CT.  Right neck pain status post port removal: The patient was referred for a soft tissue CT scan of her right neck.  Please see After Visit Summary for patient specific instructions.  Future Appointments  Date Time Provider [place]  08/28/2017  1:45 PM Nicholas Lose, MD [facility] None  08/28/2017  2:30 PM Hayden Pedro, PA-C [facility] None  10/11/2017 10:40 AM Stanford Breed, Denice Bors, MD [facility]    Orders Placed This Encounter  Procedures  . CT Head W Wo Contrast  . CT Soft Tissue Neck W Contrast  . CBC with Differential ([facility] Only)  . CMP ([place] only)       Subjective:   Patient ID:  [name] is a 57 y.o. (DOB [birth year]) female.  Chief Complaint:  Chief Complaint  Patient presents with  . Headache    HPI [name] is a 57 year old female with a history of an ER positive malignant neoplasm of the left breast. The patient is managed by Dr. Lindi Adie and is scheduled to see him for routine follow up on 08/28/2017. She completed adjuvant radiation therapy with Dr. Lisbeth Renshaw on  07/26/2017. She contacted our office this morning reporting new onset of headaches since last week. Her headaches are dull and encompass her entire head. She has been having pain in both ears. Her pain ranges from 2-3/10 to 8/10. She has a history of floaters but otherwise reports that her vision is normal.  She reports that her scalp has been sore at times.  She states that all of her teeth were hurting yesterday.  She states that it is hard to focus at times.  She has had episodic vertigo with positional changes.  She has noted right neck pain since having her port removed.  She has had episodic wheezing.  She reports occasional transient chest pressure without diaphoresis, shortness of breath, or radiation.  She denies photophobia, fevers, chills, sweats, postnasal drainage, or a productive cough.  She has been taking Tylenol for her pain.  She has been having neuropathy in her fingers and toes.  She has recently added gabapentin and Mobic.  She reports that these do not help as yet.  Medications: I have reviewed the patient's current medications.  Allergies:  Allergies  Allergen Reactions  . Aspirin Other (See Comments)    ESOPHAGITIS  . Oxycodone Hcl Diarrhea and Nausea And Vomiting  . Propoxyphene N-Acetaminophen Diarrhea and Nausea And Vomiting  . Tramadol Other (See Comments)    Cause migraines  . Adhesive [Tape] Rash and Other (See Comments)    Pulls skin off - please use paper tape  . Prednisone Rash  Past Medical History:  Diagnosis Date  . Anemia    since bypass  . Arthritis    osteoarthritis  . Asthma    states no asthma attack since 2002  . Breast cancer ([place]) 06/26/16 bx   left breast  . Chronic back pain   . Complication of anesthesia    states takes more than normal to put her to sleep  . Dental bridge present    upper  . Dental crowns present   . DVT of upper extremity (deep vein thrombosis) ([place])   . Fibromyalgia   . Genetic testing 09/19/2016   [name]  underwent genetic counseling and testing for hereditary cancer syndromes on 08/21/2016. Her results were negative for mutations in all 46 genes analyzed by [facility]'s 46-gene Common Hereditary Cancers Panel. Genes analyzed include: APC, ATM, AXIN2, BARD1, BMPR1A, BRCA1, BRCA2, BRIP1, CDH1, CDKN2A, CHEK2, CTNNA1, DICER1, EPCAM, GREM1, HOXB13, KIT, MEN1, MLH1, MSH2, MSH3, MSH6, MUTYH, NBN,   . Headache(784.0)    migraines  . History of blood transfusion 06/2005  . History of gallstones   . History of pneumonia   . History of shingles 07/2011  . HTN (hypertension)   . Normal coronary arteries 2003  . PFO (patent foramen ovale)   . Presence of inferior vena cava filter   . Pulmonary embolism ([place])   . Sleep apnea    used CPAP until after bypass surg.  . Status post gastric bypass for obesity   . Stroke [facility]) 12/2002   right-sided weakness  . Urinary incontinence     Past Surgical History:  Procedure Laterality Date  . ABDOMINAL HYSTERECTOMY  11/1997   complete  . ANTERIOR CERVICAL DECOMP/DISCECTOMY FUSION  02/05/2005   C5-6  . APPENDECTOMY  10/22/2008   laparoscopic  . BREAST LUMPECTOMY WITH RADIOACTIVE SEED AND SENTINEL LYMPH NODE BIOPSY Left 07/26/2016   Procedure: BREAST LUMPECTOMY WITH RADIOACTIVE SEED AND SENTINEL LYMPH NODE BIOPSY;  Surgeon: Erroll Luna, MD;  Location: [place];  Service: General;  Laterality: Left;  . BUNIONECTOMY  05/1980   both feet  . BUNIONECTOMY  08/2011   left foot  . CARDIAC CATHETERIZATION  03/04/2002  . CARPAL TUNNEL RELEASE  06/21/2009   right  . CARPAL TUNNEL RELEASE     left hand  . CARPAL TUNNEL RELEASE  10/03/2011   Procedure: CARPAL TUNNEL RELEASE;  Surgeon: Wynonia Sours, MD;  Location: [place];  Service: Orthopedics;  Laterality: Right;  CARPAL TUNNEL WITH HYPOTHENAR FAT PAD TRANSFER  . CERVICAL SPINE SURGERY  01/2005   titanium plate implanted  . CHOLECYSTECTOMY  1990  . ELBOW SURGERY  08/09/2004   decompression  ulnar nerve right elbow  . ENTEROLYSIS  10/22/2008   laparoscopic abd. enterolysis  . GASTRIC ROUX-EN-Y  2009  . HEEL SPUR SURGERY  08/1997   left  . HEMORRHOID SURGERY  03/1993  . LAPAROSCOPIC LYSIS INTESTINAL ADHESIONS  02/14/2000  . NAILBED REPAIR  01/10/2005; 08/2011   exc. matrix bilat. great toe  . OTHER SURGICAL HISTORY  12/1986   pt states that she had surgery to unclog her fallopean tubes  . PORTACATH PLACEMENT N/A 09/24/2016   Procedure: INSERTION PORT-A-CATH WITH [name];  Surgeon: Erroll Luna, MD;  Location: [place];  Service: General;  Laterality: N/A;  . SHOULDER SURGERY     bilat. - (left:  06/2005)  . TONSILLECTOMY  07/1995  . TRIGGER FINGER RELEASE  04/25/2006   decompression A-1 pulley left thumb  . UTERINE  FIBROID SURGERY  12/95, 7/96   x2  . VENA CAVA FILTER PLACEMENT  2009   during Roux-en-Y surg.    Family History  Problem Relation Age of Onset  . Breast cancer Paternal Aunt 83  . Cervical cancer Paternal Grandmother 51       d.40s  . Ovarian cancer Maternal Grandmother 23       d.23  . Colon polyps Father   . Diabetes Father        borderline  . Prostate cancer Father   . Hypertension Mother   . Hypertension Unknown   . Breast cancer Sister 61       treated with neoadjuvant chemo/radiation and lumpectomy    Social History   Socioeconomic History  . Marital status: Single    Spouse name: Not on file  . Number of children: 0  . Years of education: Not on file  . Highest education level: Not on file  Occupational History  . Occupation: disabled  Social Needs  . Financial resource strain: Not on file  . Food insecurity:    Worry: Not on file    Inability: Not on file  . Transportation needs:    Medical: Not on file    Non-medical: Not on file  Tobacco Use  . Smoking status: Former Research scientist (life sciences)  . Smokeless tobacco: Never Used  . Tobacco comment: quit smoking 08/1989  Substance and Sexual Activity  . Alcohol use: No  . Drug use: No  . Sexual activity:  Never    Birth control/protection: Surgical  Lifestyle  . Physical activity:    Days per week: Not on file    Minutes per session: Not on file  . Stress: Not on file  Relationships  . Social connections:    Talks on phone: Not on file    Gets together: Not on file    Attends religious service: Not on file    Active member of club or organization: Not on file    Attends meetings of clubs or organizations: Not on file    Relationship status: Not on file  . Intimate partner violence:    Fear of current or ex partner: Not on file    Emotionally abused: Not on file    Physically abused: Not on file    Forced sexual activity: Not on file  Other Topics Concern  . Not on file  Social History Narrative  . Not on file    Past Medical History, Surgical history, Social history, and Family history were reviewed and updated as appropriate.   Please see review of systems for further details on the patient's review from today.   Review of Systems:  Review of Systems  Constitutional: Negative for activity change, chills, diaphoresis and fever.  HENT: Negative for congestion, postnasal drip, rhinorrhea, sinus pressure, sinus pain and trouble swallowing.   Eyes: Positive for visual disturbance (History of floaters). Negative for photophobia.  Respiratory: Positive for chest tightness (Episodic chest pressure) and wheezing. Negative for cough and shortness of breath.   Cardiovascular: Negative for chest pain, palpitations and leg swelling.  Gastrointestinal: Negative for constipation, diarrhea, nausea and vomiting.  Musculoskeletal: Positive for neck pain.  Skin: Negative for color change, pallor and rash.  Neurological: Positive for dizziness and headaches. Negative for weakness.  Psychiatric/Behavioral: Positive for decreased concentration.    Objective:   Physical Exam:  BP 129/74 (BP Location: Right Arm, Patient Position: Sitting)   Pulse 69   Temp 98.2 F (36.8 C) (  Oral)   Resp 18    Ht 5' 1" (1.549 m)   Wt 210 lb 3.2 oz (95.3 kg)   SpO2 100%   BMI 39.72 kg/m  ECOG: 0  Physical Exam  Constitutional: No distress.  HENT:  Head: Normocephalic and atraumatic.  Right Ear: External ear normal.  Left Ear: External ear normal.  Mouth/Throat: Oropharynx is clear and moist. No oropharyngeal exudate.  Eyes: Right eye exhibits no discharge. Left eye exhibits no discharge. No scleral icterus.  Neck: Normal range of motion. Neck supple.  Cardiovascular: Normal rate, regular rhythm and normal heart sounds. Exam reveals no gallop and no friction rub.  No murmur heard. Pulmonary/Chest: Effort normal and breath sounds normal. No respiratory distress. She has no wheezes. She has no rales.  Musculoskeletal: She exhibits no edema or deformity.  Lymphadenopathy:    She has no cervical adenopathy.  Neurological: She is alert. Coordination normal.  Skin: Skin is warm and dry. No rash noted. She is not diaphoretic. No erythema.    Lab Review:     Component Value Date/Time   NA 140 08/12/2017 1411   NA 138 04/19/2017 1005   K 3.8 08/12/2017 1411   K 4.0 04/19/2017 1005   CL 110 (H) 08/12/2017 1411   CO2 24 08/12/2017 1411   CO2 25 04/19/2017 1005   GLUCOSE 93 08/12/2017 1411   GLUCOSE 87 04/19/2017 1005   BUN 11 08/12/2017 1411   BUN 12.0 04/19/2017 1005   CREATININE 0.63 08/12/2017 1411   CREATININE 0.7 04/19/2017 1005   CALCIUM 8.6 08/12/2017 1411   CALCIUM 8.9 04/19/2017 1005   PROT 6.4 08/12/2017 1411   PROT 6.7 04/19/2017 1005   ALBUMIN 3.6 08/12/2017 1411   ALBUMIN 3.9 04/19/2017 1005   AST 15 08/12/2017 1411   AST 14 04/19/2017 1005   ALT 11 08/12/2017 1411   ALT 11 04/19/2017 1005   ALKPHOS 67 08/12/2017 1411   ALKPHOS 50 04/19/2017 1005   BILITOT 0.4 08/12/2017 1411   BILITOT 0.31 04/19/2017 1005   GFRNONAA >60 08/12/2017 1411   GFRAA >60 08/12/2017 1411       Component Value Date/Time   WBC 3.1 (L) 08/12/2017 1411   WBC 3.5 (L) 04/19/2017 1005    WBC 7.5 12/14/2016 0120   RBC 3.35 (L) 08/12/2017 1411   HGB 10.2 (L) 04/19/2017 1005   HCT 32.0 (L) 08/12/2017 1411   HCT 31.1 (L) 04/19/2017 1005   PLT 175 08/12/2017 1411   PLT 184 04/19/2017 1005   MCV 95.5 08/12/2017 1411   MCV 97.5 04/19/2017 1005   MCH 31.3 08/12/2017 1411   MCHC 32.8 08/12/2017 1411   RDW 13.9 08/12/2017 1411   RDW 15.3 (H) 04/19/2017 1005   LYMPHSABS 0.9 08/12/2017 1411   LYMPHSABS 1.0 04/19/2017 1005   MONOABS 0.4 08/12/2017 1411   MONOABS 0.3 04/19/2017 1005   EOSABS 0.1 08/12/2017 1411   EOSABS 0.1 04/19/2017 1005   BASOSABS 0.0 08/12/2017 1411   BASOSABS 0.0 04/19/2017 1005   -------------------------------  Imaging from last 24 hours (if applicable):  Radiology interpretation: No results found.

## 2017-08-14 DIAGNOSIS — M25562 Pain in left knee: Secondary | ICD-10-CM | POA: Diagnosis not present

## 2017-08-14 DIAGNOSIS — M25552 Pain in left hip: Secondary | ICD-10-CM | POA: Diagnosis not present

## 2017-08-14 DIAGNOSIS — M1711 Unilateral primary osteoarthritis, right knee: Secondary | ICD-10-CM | POA: Diagnosis not present

## 2017-08-14 DIAGNOSIS — M1712 Unilateral primary osteoarthritis, left knee: Secondary | ICD-10-CM | POA: Diagnosis not present

## 2017-08-14 NOTE — Progress Notes (Signed)
  Radiation Oncology         [phone] ________________________________  Name: [name] MRN: [id number]  Date: 07/26/2017  DOB: 1961/01/18  End of Treatment Note  Diagnosis:   57 y.o. 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 This was delivered using a 3-D conformal technique. The patient then received a boost to the seroma. This delivered an additional 10 Gy in 5 fractions using a 3-D technique. The total dose was 60.4 Gy.  Narrative: The patient tolerated radiation treatment relatively well.   The patient had some expected skin irritation as she progressed during treatment. Moist desquamation was not present at the end of treatment.  Plan: The patient has completed radiation treatment. The patient will return to radiation oncology clinic for routine followup in one month. I advised the patient to call or return sooner if they have any questions or concerns related to their recovery or treatment. ________________________________  Jodelle Gross, MD, PhD  This document serves as a record of services personally performed by Kyung Rudd, MD. It was created on his behalf by Rae Lips, a trained medical scribe. The creation of this record is based on the scribe's personal observations and the provider's statements to them. This document has been checked and approved by the attending provider.

## 2017-08-15 ENCOUNTER — Telehealth: Payer: Self-pay

## 2017-08-15 NOTE — Telephone Encounter (Signed)
Appointment made for CT of head and neck for patient through central scheduling.  Spoke with patient and gave date/time and instructions for appt. Patient voiced understanding.

## 2017-08-20 ENCOUNTER — Ambulatory Visit (HOSPITAL_COMMUNITY)
Admission: RE | Admit: 2017-08-20 | Discharge: 2017-08-20 | Disposition: A | Discharge status: Home / Self Care | Payer: Medicare Other | Source: Ambulatory Visit | Attending: Medical | Admitting: Medical

## 2017-08-20 DIAGNOSIS — R519 Headache, unspecified: Secondary | ICD-10-CM

## 2017-08-20 DIAGNOSIS — R51 Headache: Secondary | ICD-10-CM | POA: Diagnosis not present

## 2017-08-20 DIAGNOSIS — I82C21 Chronic embolism and thrombosis of right internal jugular vein: Secondary | ICD-10-CM | POA: Diagnosis not present

## 2017-08-20 DIAGNOSIS — M542 Cervicalgia: Secondary | ICD-10-CM | POA: Diagnosis not present

## 2017-08-20 MED ORDER — IOPAMIDOL (ISOVUE-300) INJECTION 61%
75.0000 mL | Freq: Once | INTRAVENOUS | Status: AC | PRN
  Administered 2017-08-20: 75 mL via INTRAVENOUS

## 2017-08-20 MED ORDER — IOPAMIDOL (ISOVUE-300) INJECTION 61%
INTRAVENOUS | Status: AC
  Filled 2017-08-20: qty 75

## 2017-08-22 NOTE — Progress Notes (Signed)
These results were called to [name] and were reviewed with her . Her were answered. She expressed understanding.

## 2017-08-27 NOTE — Assessment & Plan Note (Addendum)
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 Adjuvant chemotherapy with Adriamycin/Cytoxan x 4, then weekly Taxol x 12 completed 04/19/17 2. Adjuvant radiation therapy completed on 07/26/2017 3. Adjuvant antiestrogen therapy starts 08/28/17 -------------------------------------------------------------------------------------------------------------------------------- Plan: Adj Anti estrogen therapy with Letrozole 2.5 mg daily Letrozole Counseling: We discussed the risks and benefits of anti-estrogen therapy with aromatase inhibitors. These include but not limited to insomnia, hot flashes, mood changes, vaginal dryness, bone density loss, and weight gain. We strongly believe that the benefits far outweigh the risks. Patient understands these risks and consented to starting treatment. Planned treatment duration is 5-7 years.  RTC in 3 months for SCP visit

## 2017-08-28 ENCOUNTER — Encounter: Payer: Self-pay | Admitting: Radiation Oncology

## 2017-08-28 ENCOUNTER — Telehealth: Payer: Self-pay | Admitting: Hematology and Oncology

## 2017-08-28 ENCOUNTER — Other Ambulatory Visit: Payer: Self-pay

## 2017-08-28 ENCOUNTER — Ambulatory Visit
Admission: RE | Admit: 2017-08-28 | Discharge: 2017-08-28 | Disposition: A | Discharge status: Home / Self Care | Payer: Medicare Other | Source: Ambulatory Visit | Attending: Radiation Oncology | Admitting: Radiation Oncology

## 2017-08-28 ENCOUNTER — Inpatient Hospital Stay: Payer: Medicare Other | Attending: Hematology and Oncology | Admitting: Hematology and Oncology

## 2017-08-28 VITALS — BP 116/65 | HR 58 | Temp 99.1°F | Resp 18 | Wt 204.4 lb

## 2017-08-28 DIAGNOSIS — Z888 Allergy status to other drugs, medicaments and biological substances status: Secondary | ICD-10-CM | POA: Diagnosis not present

## 2017-08-28 DIAGNOSIS — C50912 Malignant neoplasm of unspecified site of left female breast: Secondary | ICD-10-CM | POA: Diagnosis not present

## 2017-08-28 DIAGNOSIS — Z170 Estrogen receptor positive status [ER+]: Secondary | ICD-10-CM

## 2017-08-28 DIAGNOSIS — C50512 Malignant neoplasm of lower-outer quadrant of left female breast: Secondary | ICD-10-CM

## 2017-08-28 DIAGNOSIS — Z885 Allergy status to narcotic agent status: Secondary | ICD-10-CM | POA: Diagnosis not present

## 2017-08-28 DIAGNOSIS — Z923 Personal history of irradiation: Secondary | ICD-10-CM

## 2017-08-28 DIAGNOSIS — Z79811 Long term (current) use of aromatase inhibitors: Secondary | ICD-10-CM | POA: Diagnosis not present

## 2017-08-28 DIAGNOSIS — Z9221 Personal history of antineoplastic chemotherapy: Secondary | ICD-10-CM | POA: Insufficient documentation

## 2017-08-28 DIAGNOSIS — Z886 Allergy status to analgesic agent status: Secondary | ICD-10-CM | POA: Insufficient documentation

## 2017-08-28 DIAGNOSIS — Z853 Personal history of malignant neoplasm of breast: Secondary | ICD-10-CM | POA: Insufficient documentation

## 2017-08-28 DIAGNOSIS — Z79899 Other long term (current) drug therapy: Secondary | ICD-10-CM | POA: Insufficient documentation

## 2017-08-28 MED ORDER — LETROZOLE 2.5 MG PO TABS: 2.5000 mg | ORAL_TABLET | Freq: Every day | ORAL | 3 refills | Status: DC

## 2017-08-28 MED ORDER — PREGABALIN 75 MG PO CAPS: 75.0000 mg | ORAL_CAPSULE | Freq: Every day | ORAL | 3 refills | Status: DC

## 2017-08-28 MED FILL — LYRICA 75 MG CAPSULE: 75 | 33 days supply | Qty: 60 | Fill #0

## 2017-08-28 MED FILL — LETROZOLE 2.5 MG TABLET: 2.5 | 90 days supply | Qty: 90 | Fill #0

## 2017-08-28 NOTE — Progress Notes (Signed)
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 This was delivered using a 3-D conformal technique. The patient then received a boost to the seroma. This delivered an additional 10 Gy in 5 fractions using a 3-D technique. The total dose was 60.4 Gy.   Narrative:  The patient returns today for routine follow-up. During treatment she did very well with radiotherapy and did not have significant desquamation.                             On review of systems, the patient states she continues to have terrible breast pain despite flexiril, mobic, and neurontin. She is also taking tylenol with codeine at times. She describes shooting pain in the breast and reports this comes on at least once a day and can last. She has seen PT in the past as well. Unfortunately PT is not covered by her insurance, and is quite expensive to consider. She reports her skin along the breast is improved and no other complaints are noted.  ALLERGIES:  is allergic to aspirin; oxycodone hcl; propoxyphene n-acetaminophen; tramadol; adhesive [tape]; and prednisone.  Meds: Current Outpatient Medications  Medication Sig Dispense Refill  . acetaminophen (TYLENOL) 500 MG tablet Take 1,000 mg by mouth every 6 (six) hours as needed (pain).    Marland Kitchen albuterol (PROAIR HFA) 108 (90 Base) MCG/ACT inhaler Inhale 2 puffs into the lungs every 6 (six) hours as needed for wheezing or shortness of breath.    . Calcium Citrate-Vitamin D (CALCIUM CITRATE +D PO) Take 2 tablets by mouth  daily. Calcium 600 mg     . cyclobenzaprine (FLEXERIL) 5 MG tablet Take 5 mg by mouth 3 (three) times daily as needed for muscle spasms.    Marland Kitchen letrozole (FEMARA) 2.5 MG tablet Take 1 tablet (2.5 mg total) by mouth daily. 90 tablet 3  . lisinopril (PRINIVIL,ZESTRIL) 20 MG tablet Take 20 mg by mouth daily.    . meloxicam (MOBIC) 7.5 MG tablet Take 1 tablet (7.5 mg total) by mouth 2 (two) times daily. 60 tablet 0  . Multiple Vitamin (MULTIVITAMIN WITH MINERALS) TABS tablet Take 1 tablet by mouth at bedtime.    Marland Kitchen oxybutynin (DITROPAN) 5 MG tablet TAKE 1 TABLET (5 MG TOTAL) BY MOUTH 3 (THREE) TIMES DAILY. 270 tablet 0  . ranitidine (ZANTAC) 300 MG capsule Take 1 capsule (300 mg total) by mouth 2 (two) times daily. 60 capsule 3  . silver sulfADIAZINE (SILVADENE) 1 % cream Apply 1 application topically 2 (two) times daily as needed (stomach tears). 50 g 0  . Turmeric 500 MG CAPS Take by mouth.    . valACYclovir (VALTREX) 500 MG tablet Take 1 tablet by mouth as needed.  1  . acetaminophen-codeine (TYLENOL #3) 300-30 MG tablet Take 1 tablet by mouth every 4 (four) hours as needed for moderate pain. (Patient not taking: Reported on 08/12/2017) 30 tablet 0  . fluticasone (FLONASE) 50 MCG/ACT nasal spray Place  1 spray into both nostrils 2 (two) times daily. (Patient not taking: Reported on 08/12/2017) 16 g 5  . meclizine (ANTIVERT) 25 MG tablet Take 1 tablet (25 mg total) by mouth 3 (three) times daily as needed for dizziness. (Patient not taking: Reported on 08/28/2017) 30 tablet 0  . mometasone (ELOCON) 0.1 % cream Apply 1 application topically daily as needed (rash - summer eczema). Apply to arms    . omeprazole (PRILOSEC) 40 MG capsule Take 1 capsule (40 mg total) by mouth daily. (Patient not taking: Reported on 08/28/2017) 30 capsule 5  . pregabalin (LYRICA) 75 MG capsule Take 1 capsule (75 mg total) by mouth daily. 1 capsule daily, may increase to two capsules daily after 1 week 60 capsule 3  . sucralfate  (CARAFATE) 1 g tablet Take 1 tablet (1 g total) by mouth 4 (four) times daily -  with meals and at bedtime. (Patient not taking: Reported on 08/28/2017) 120 tablet 0   No current facility-administered medications for this encounter.     Physical Findings:  weight is 204 lb 6.4 oz (92.7 kg). Her oral temperature is 99.1 F (37.3 C). Her blood pressure is 116/65 and her pulse is 58 (abnormal). Her respiration is 18 and oxygen saturation is 100%.  Pain Assessment Pain Score: 8  Pain Loc: Breast(Left breast)/10 In general this is a well appearing African American female in no acute distress. She's alert and oriented x4 and appropriate throughout the examination. Cardiopulmonary assessment is negative for acute distress and she exhibits normal effort. The left breast was examined and reveals no significant hyperpigmentation, and no desquamation.   Lab Findings: Lab Results  Component Value Date   WBC 3.1 (L) 08/12/2017   HGB 10.2 (L) 04/19/2017   HCT 32.0 (L) 08/12/2017   MCV 95.5 08/12/2017   PLT 175 08/12/2017     Radiographic Findings: Ct Head W Wo Contrast  Result Date: 08/20/2017 CLINICAL DATA:  Right neck pain after Port-A-Cath removal. Headache. History of breast cancer. EXAM: CT HEAD WITHOUT AND WITH CONTRAST CT NECK WITH CONTRAST TECHNIQUE: Contiguous axial images were obtained from the base of the skull through the vertex without and with intravenous contrast. Multidetector CT imaging of the and neck was performed using the standard protocol following the bolus administration of intravenous contrast. CONTRAST:  59mL ISOVUE-300 IOPAMIDOL (ISOVUE-300) INJECTION 61% COMPARISON:  CT head 09/30/2015.  CT face 12/14/2016 FINDINGS: CT HEAD FINDINGS Brain: No evidence of acute infarction, hemorrhage, hydrocephalus, extra-axial collection or mass lesion/mass effect. Normal enhancement postcontrast administration. Negative for mass lesion. Vascular: Negative for hyperdense vessel Skull: Negative  Sinuses/Orbits: Negative Other: None CT NECK FINDINGS Pharynx and larynx: Normal. No mass or swelling. Salivary glands: Resolution of left parotid swelling and enhancement. Salivary glands now normal without stone or mass or edema. Thyroid: Small coarse calcification right lobe of the thyroid without mass lesion. Lymph nodes: No enlarged or pathologic lymph nodes. Vascular: Proximal right jugular vein is patent but occludes in the mid neck. This is similar to the CT face 12/14/2016. Left jugular vein widely patent. Carotid arteries patent bilaterally. Mastoids and visualized paranasal sinuses: Negative Skeleton: ACDF with solid fusion C5-6. No acute skeletal abnormality. Upper chest: Lung apices clear Other: None IMPRESSION: 1. Negative CT head with contrast 2. 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 No mass or adenopathy in the neck 3. Resolution of left parotid gland swelling and enhancement since the prior CT of 12/14/2016 Electronically Signed   By: Juanda Crumble  Carlis Abbott M.D.   On: 08/20/2017 16:24   Ct Soft Tissue Neck W Contrast  Result Date: 08/20/2017 CLINICAL DATA:  Right neck pain after Port-A-Cath removal. Headache. History of breast cancer. EXAM: CT HEAD WITHOUT AND WITH CONTRAST CT NECK WITH CONTRAST TECHNIQUE: Contiguous axial images were obtained from the base of the skull through the vertex without and with intravenous contrast. Multidetector CT imaging of the and neck was performed using the standard protocol following the bolus administration of intravenous contrast. CONTRAST:  7mL ISOVUE-300 IOPAMIDOL (ISOVUE-300) INJECTION 61% COMPARISON:  CT head 09/30/2015.  CT face 12/14/2016 FINDINGS: CT HEAD FINDINGS Brain: No evidence of acute infarction, hemorrhage, hydrocephalus, extra-axial collection or mass lesion/mass effect. Normal enhancement postcontrast administration. Negative for mass lesion. Vascular: Negative for hyperdense vessel Skull: Negative Sinuses/Orbits:  Negative Other: None CT NECK FINDINGS Pharynx and larynx: Normal. No mass or swelling. Salivary glands: Resolution of left parotid swelling and enhancement. Salivary glands now normal without stone or mass or edema. Thyroid: Small coarse calcification right lobe of the thyroid without mass lesion. Lymph nodes: No enlarged or pathologic lymph nodes. Vascular: Proximal right jugular vein is patent but occludes in the mid neck. This is similar to the CT face 12/14/2016. Left jugular vein widely patent. Carotid arteries patent bilaterally. Mastoids and visualized paranasal sinuses: Negative Skeleton: ACDF with solid fusion C5-6. No acute skeletal abnormality. Upper chest: Lung apices clear Other: None IMPRESSION: 1. Negative CT head with contrast 2. 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 No mass or adenopathy in the neck 3. Resolution of left parotid gland swelling and enhancement since the prior CT of 12/14/2016 Electronically Signed   By: Franchot Gallo M.D.   On: 08/20/2017 16:24    Impression/Plan: 1. 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 The patient has been doing well since completion of radiotherapy. We discussed that we would be happy to continue to follow her as needed, but she will also continue to follow up with Dr. Lindi Adie in medical oncology. She was counseled on skin care as well as measures to avoid sun exposure to this area.  2. Survivorship. The patient will meet with survivorship clinic this summer and we reviewed the rationale for this. 3. Breast pain secondary to treatment for #1. The patient is counseled on the possibilities of this being a chronic issue. She is offered evaluation with PT but the cost is prohibitive at this time. We also discussed off label use of Lyrica. She is interested in trying this in place of Neurontin. We reviewed the side effect profile of this and I will send in a prescription  for her to try this for a few weeks.       Carola Rhine, PAC

## 2017-08-28 NOTE — Progress Notes (Signed)
Patient Care Team: [name], MD as PCP - General (Family Medicine) Nicholas Lose, MD as Consulting Physician (Hematology and Oncology)  DIAGNOSIS:  Encounter Diagnosis  Name Primary?  . Malignant neoplasm of lower-outer quadrant of left breast of female, estrogen receptor positive ([place])     SUMMARY OF ONCOLOGIC HISTORY:   Malignant neoplasm of lower-outer quadrant of left breast of female, estrogen receptor positive ([place])   06/26/2016 Initial Diagnosis    Left breast biopsy 3:00: IDC with DCIS, grade 3, ER 80%, PR 20%, Ki-67 60%, HER-2 negative ratio 1.18; palpable lump: 1.8 cm lesion, no lymph nodes, T1 cN0 stage I a clinical stage      07/26/2016 Surgery    Left lumpectomy: IDC 1.8 cm, with DCIS, margins negative, 0/2 lymph nodes, ER 80%, PR 20%, HER-2 negative ratio 1.18, Ki-67 60%, T1cN0 stage IA       08/21/2016 Oncotype testing    Oncotype DX recurrence score 51, risk of recurrence 34%      08/21/2016 Genetic Testing    Genetic counseling and testing for hereditary cancer syndromes performed on 08/21/2016. Results are negative for pathogenic mutations in 46 genes analyzed by [facility] Common Hereditary Cancers Panel. Results are dated 09/14/2016. Genes tested: APC, ATM, AXIN2, BARD1, BMPR1A, BRCA1, BRCA2, BRIP1, CDH1, CDKN2A, CHEK2, CTNNA1, DICER1, EPCAM, GREM1, HOXB13, KIT, MEN1, MLH1, MSH2, MSH3, MSH6, MUTYH, NBN, NF1, NTHL1, PALB2, PDGFRA, PMS2, POLD1, POLE, PTEN, RAD50, RAD51C, RAD51D, SDHA, SDHB, SDHC, SDHD, SMAD4, SMARCA4, STK11, TP53, TSC1, TSC2, and VHL.        11/02/2016 - 04/19/2017 Chemotherapy    Dose dense Adriamycin and Cytoxan 4 followed by Taxol weekly 12      06/10/2017 - 07/26/2017 Radiation Therapy    Adjuvant radiation with Dr. Lisbeth Renshaw       CHIEF COMPLIANT: Follow-up after completion of adjuvant radiation  INTERVAL HISTORY: [name] is a 69-year with above-mentioned history of left breast cancer treated with lumpectomy followed by adjuvant  chemotherapy and radiation is here today for follow-up after radiation is complete.  She is here to talk about starting antiestrogen therapy.  She is healing very well from the effects of radiation.  She continues to have multiple other problems related to prior chemo in terms of neuropathy as well as fatigue.  She also complains of acute intermittent headaches.  She had a CT head which was negative.  REVIEW OF SYSTEMS:   Constitutional: Denies fevers, chills or abnormal weight loss Eyes: Denies blurriness of vision Ears, nose, mouth, throat, and face: Denies mucositis or sore throat Respiratory: Denies cough, dyspnea or wheezes Cardiovascular: Denies palpitation, chest discomfort Gastrointestinal:  Denies nausea, heartburn or change in bowel habits Skin: Denies abnormal skin rashes Lymphatics: Denies new lymphadenopathy or easy bruising Neurological: Severe neuropathy in the extremities, severe headache Behavioral/Psych: Mood is stable, no new changes  Extremities: No lower extremity edema  All other systems were reviewed with the patient and are negative.  I have reviewed the past medical history, past surgical history, social history and family history with the patient and they are unchanged from previous note.  ALLERGIES:  is allergic to aspirin; oxycodone hcl; propoxyphene n-acetaminophen; tramadol; adhesive [tape]; and prednisone.  MEDICATIONS:  Current Outpatient Medications  Medication Sig Dispense Refill  . acetaminophen (TYLENOL) 500 MG tablet Take 1,000 mg by mouth every 6 (six) hours as needed (pain).    Marland Kitchen acetaminophen-codeine (TYLENOL #3) 300-30 MG tablet Take 1 tablet by mouth every 4 (four) hours as needed for moderate  pain. (Patient not taking: Reported on 08/12/2017) 30 tablet 0  . albuterol (PROAIR HFA) 108 (90 Base) MCG/ACT inhaler Inhale 2 puffs into the lungs every 6 (six) hours as needed for wheezing or shortness of breath.    . Calcium Citrate-Vitamin D (CALCIUM  CITRATE +D PO) Take 2 tablets by mouth daily. Calcium 600 mg     . cyclobenzaprine (FLEXERIL) 5 MG tablet Take 5 mg by mouth 3 (three) times daily as needed for muscle spasms.    . fluticasone (FLONASE) 50 MCG/ACT nasal spray Place 1 spray into both nostrils 2 (two) times daily. (Patient not taking: Reported on 08/12/2017) 16 g 5  . gabapentin (NEURONTIN) 300 MG capsule Take 300 mg by mouth 3 (three) times daily.    Marland Kitchen letrozole (FEMARA) 2.5 MG tablet Take 1 tablet (2.5 mg total) by mouth daily. 90 tablet 3  . lisinopril (PRINIVIL,ZESTRIL) 20 MG tablet Take 20 mg by mouth daily.    . meclizine (ANTIVERT) 25 MG tablet Take 1 tablet (25 mg total) by mouth 3 (three) times daily as needed for dizziness. 30 tablet 0  . meloxicam (MOBIC) 7.5 MG tablet Take 1 tablet (7.5 mg total) by mouth 2 (two) times daily. 60 tablet 0  . mometasone (ELOCON) 0.1 % cream Apply 1 application topically daily as needed (rash - summer eczema). Apply to arms    . Multiple Vitamin (MULTIVITAMIN WITH MINERALS) TABS tablet Take 1 tablet by mouth at bedtime.    Marland Kitchen omeprazole (PRILOSEC) 40 MG capsule Take 1 capsule (40 mg total) by mouth daily. (Patient not taking: Reported on 05/09/2017) 30 capsule 5  . oxybutynin (DITROPAN) 5 MG tablet TAKE 1 TABLET (5 MG TOTAL) BY MOUTH 3 (THREE) TIMES DAILY. 270 tablet 0  . ranitidine (ZANTAC) 300 MG capsule Take 1 capsule (300 mg total) by mouth 2 (two) times daily. (Patient not taking: Reported on 07/01/2017) 60 capsule 3  . silver sulfADIAZINE (SILVADENE) 1 % cream Apply 1 application topically 2 (two) times daily as needed (stomach tears). 50 g 0  . sucralfate (CARAFATE) 1 g tablet Take 1 tablet (1 g total) by mouth 4 (four) times daily -  with meals and at bedtime. (Patient not taking: Reported on 05/09/2017) 120 tablet 0  . Turmeric 500 MG CAPS Take by mouth.    . valACYclovir (VALTREX) 500 MG tablet Take 1 tablet by mouth as needed.  1   No current facility-administered medications for  this visit.     PHYSICAL EXAMINATION: ECOG PERFORMANCE STATUS: 1 - Symptomatic but completely ambulatory  Vitals:   08/28/17 1346  BP: 131/72  Pulse: 69  Resp: 19  Temp: 98.2 F (36.8 C)  SpO2: 100%   Filed Weights   08/28/17 1346  Weight: 204 lb 12.8 oz (92.9 kg)    GENERAL:alert, no distress and comfortable SKIN: skin color, texture, turgor are normal, no rashes or significant lesions EYES: normal, Conjunctiva are pink and non-injected, sclera clear OROPHARYNX:no exudate, no erythema and lips, buccal mucosa, and tongue normal  NECK: supple, thyroid normal size, non-tender, without nodularity LYMPH:  no palpable lymphadenopathy in the cervical, axillary or inguinal LUNGS: clear to auscultation and percussion with normal breathing effort HEART: regular rate & rhythm and no murmurs and no lower extremity edema ABDOMEN:abdomen soft, non-tender and normal bowel sounds MUSCULOSKELETAL:no cyanosis of digits and no clubbing  NEURO: alert & oriented x 3 with fluent speech, severe neuropathy in hands and feet EXTREMITIES: No lower extremity edema   LABORATORY DATA:  I have reviewed the data as listed CMP Latest Ref Rng & Units 08/12/2017 06/14/2017 04/19/2017  Glucose 70 - 140 mg/dL 93 97 87  BUN 7 - 26 mg/dL 11 11 12.0  Creatinine 0.60 - 1.10 mg/dL 0.63 0.67 0.7  Sodium 136 - 145 mmol/L 140 138 138  Potassium 3.5 - 5.1 mmol/L 3.8 4.1 4.0  Chloride 98 - 109 mmol/L 110(H) 107 -  CO2 22 - 29 mmol/L 24 24 25   Calcium 8.4 - 10.4 mg/dL 8.6 9.0 8.9  Total Protein 6.4 - 8.3 g/dL 6.4 7.0 6.7  Total Bilirubin 0.2 - 1.2 mg/dL 0.4 0.4 0.31  Alkaline Phos 40 - 150 U/L 67 67 50  AST 5 - 34 U/L 15 16 14   ALT 0 - 55 U/L 11 9 11     Lab Results  Component Value Date   WBC 3.1 (L) 08/12/2017   HGB 10.2 (L) 04/19/2017   HCT 32.0 (L) 08/12/2017   MCV 95.5 08/12/2017   PLT 175 08/12/2017   NEUTROABS 1.7 08/12/2017    ASSESSMENT & PLAN:  Malignant neoplasm of lower-outer quadrant of  left breast of female, estrogen receptor positive ([place]) 07/26/2016: Left lumpectomy: IDC 1.8 cm, with DCIS, margins negative, 0/2 lymph nodes, ER 80%, PR 20%, HER-2 negative ratio 1.18, Ki-67 60%, T1cN0 stage IA  Oncotype DX score 51: 10 year risk of recurrence greater than 34%, extremely high risk  Treatment Summary: 1. Adjuvant chemotherapy with Adriamycin/Cytoxan x 4, then weekly Taxol x 12 completed 04/19/17 2. Adjuvant radiation therapy completed on 07/26/2017 3. Adjuvant antiestrogen therapy starts 08/28/17 -------------------------------------------------------------------------------------------------------------------------------- Plan: Adj Anti estrogen therapy with Letrozole 2.5 mg daily Letrozole Counseling: We discussed the risks and benefits of anti-estrogen therapy with aromatase inhibitors. These include but not limited to insomnia, hot flashes, mood changes, vaginal dryness, bone density loss, and weight gain. We strongly believe that the benefits far outweigh the risks. Patient understands these risks and consented to starting treatment. Planned treatment duration is 5-7 years.  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  Discussed with her about Cymbalta but she does not want to take any more medication. Intermittent headaches: CT head was normal. I discussed with her that there is no role of total body scans of any kind unless she has any localizing symptoms. I gave her information live strong program.  She will check of [facility] has any such activities.  RTC in 3 months for SCP visit   No orders of the defined types were placed in this encounter.  The patient has a good understanding of the overall plan. she agrees with it. she will call with any problems that may develop before the next visit here.   Harriette Ohara, MD 08/28/17

## 2017-08-28 NOTE — Telephone Encounter (Signed)
Gave patient AVs and calendar of upcoming July appointments.  °

## 2017-08-29 DIAGNOSIS — M47816 Spondylosis without myelopathy or radiculopathy, lumbar region: Secondary | ICD-10-CM | POA: Diagnosis not present

## 2017-08-29 MED FILL — BACLOFEN 10 MG TABLET: 10 | 30 days supply | Qty: 90 | Fill #0

## 2017-08-29 MED FILL — predniSONE 10 MG TABS: 10 | 6 days supply | Qty: 21 | Fill #0

## 2017-08-30 MED FILL — PENICILLIN VK 500 MG TABLET: 500 | 14 days supply | Qty: 56 | Fill #0

## 2017-09-01 ENCOUNTER — Other Ambulatory Visit: Payer: Self-pay

## 2017-09-01 ENCOUNTER — Emergency Department (HOSPITAL_BASED_OUTPATIENT_CLINIC_OR_DEPARTMENT_OTHER)
Admission: EM | Admit: 2017-09-01 | Discharge: 2017-09-02 | Disposition: A | Discharge status: Home / Self Care | Payer: Medicare Other | Attending: Emergency Medicine | Admitting: Emergency Medicine

## 2017-09-01 ENCOUNTER — Encounter (HOSPITAL_BASED_OUTPATIENT_CLINIC_OR_DEPARTMENT_OTHER): Payer: Self-pay | Admitting: *Deleted

## 2017-09-01 ENCOUNTER — Emergency Department (HOSPITAL_BASED_OUTPATIENT_CLINIC_OR_DEPARTMENT_OTHER): Payer: Medicare Other

## 2017-09-01 DIAGNOSIS — J45909 Unspecified asthma, uncomplicated: Secondary | ICD-10-CM | POA: Insufficient documentation

## 2017-09-01 DIAGNOSIS — I10 Essential (primary) hypertension: Secondary | ICD-10-CM | POA: Diagnosis not present

## 2017-09-01 DIAGNOSIS — Z86718 Personal history of other venous thrombosis and embolism: Secondary | ICD-10-CM | POA: Diagnosis not present

## 2017-09-01 DIAGNOSIS — Y939 Activity, unspecified: Secondary | ICD-10-CM | POA: Diagnosis not present

## 2017-09-01 DIAGNOSIS — Z79899 Other long term (current) drug therapy: Secondary | ICD-10-CM | POA: Insufficient documentation

## 2017-09-01 DIAGNOSIS — Z87891 Personal history of nicotine dependence: Secondary | ICD-10-CM | POA: Insufficient documentation

## 2017-09-01 DIAGNOSIS — Y998 Other external cause status: Secondary | ICD-10-CM | POA: Insufficient documentation

## 2017-09-01 DIAGNOSIS — S90122A Contusion of left lesser toe(s) without damage to nail, initial encounter: Secondary | ICD-10-CM

## 2017-09-01 DIAGNOSIS — Y929 Unspecified place or not applicable: Secondary | ICD-10-CM | POA: Diagnosis not present

## 2017-09-01 DIAGNOSIS — S99922A Unspecified injury of left foot, initial encounter: Secondary | ICD-10-CM | POA: Diagnosis not present

## 2017-09-01 DIAGNOSIS — Z8673 Personal history of transient ischemic attack (TIA), and cerebral infarction without residual deficits: Secondary | ICD-10-CM | POA: Diagnosis not present

## 2017-09-01 DIAGNOSIS — W228XXA Striking against or struck by other objects, initial encounter: Secondary | ICD-10-CM | POA: Diagnosis not present

## 2017-09-01 DIAGNOSIS — M79672 Pain in left foot: Secondary | ICD-10-CM | POA: Diagnosis not present

## 2017-09-01 NOTE — ED Provider Notes (Signed)
[facility] Provider Note   CSN: [id number] Arrival date & time: 09/01/17  2045     History   Chief Complaint Chief Complaint  Patient presents with  . Foot Injury    HPI [name] is a 57 y.o. female.  The history is provided by the patient. No language interpreter was used.  Foot Injury      [name] is a 57 y.o. female who presents to the Emergency Department complaining of foot injury. 5 PM she slipped and kicked her left third toe on a step stool. She reports pain to the third toe into the distal foot. Pain is worse with walking. No additional injuries. She does have a history of surgery to her left foot. Symptoms are mild to moderate and constant in nature.  Past Medical History:  Diagnosis Date  . Anemia    since bypass  . Arthritis    osteoarthritis  . Asthma    states no asthma attack since 2002  . Breast cancer ([place]) 06/26/16 bx   left breast  . Chronic back pain   . Complication of anesthesia    states takes more than normal to put her to sleep  . Dental bridge present    upper  . Dental crowns present   . DVT of upper extremity (deep vein thrombosis) ([place])   . Fibromyalgia   . Genetic testing 09/19/2016   Ms. Clair underwent genetic counseling and testing for hereditary cancer syndromes on 08/21/2016. Her results were negative for mutations in all 46 genes analyzed by [facility]'s 46-gene Common Hereditary Cancers Panel. Genes analyzed include: APC, ATM, AXIN2, BARD1, BMPR1A, BRCA1, BRCA2, BRIP1, CDH1, CDKN2A, CHEK2, CTNNA1, DICER1, EPCAM, GREM1, HOXB13, KIT, MEN1, MLH1, MSH2, MSH3, MSH6, MUTYH, NBN,   . Headache(784.0)    migraines  . History of blood transfusion 06/2005  . History of gallstones   . History of pneumonia   . History of shingles 07/2011  . HTN (hypertension)   . Normal coronary arteries 2003  . PFO (patent foramen ovale)   . Presence of inferior vena cava filter   . Pulmonary embolism ([place])   .  Sleep apnea    used CPAP until after bypass surg.  . Status post gastric bypass for obesity   . Stroke [facility]) 12/2002   right-sided weakness  . Urinary incontinence     Patient Active Problem List   Diagnosis Date Noted  . Chemotherapy-induced peripheral neuropathy ([place]) 02/15/2017  . Port catheter in place 11/02/2016  . Genetic testing 09/19/2016  . Pre-operative clearance 07/23/2016  . Malignant neoplasm of lower-outer quadrant of left breast of female, estrogen receptor positive ([place]) 07/10/2016  . Essential hypertension   . Chest pain 09/30/2015  . DVT, lower extremity ([place]) 06/18/2011  . DVT of upper extremity (deep vein thrombosis) ([place]) 06/18/2011  . [place] filter in place 06/18/2011  . History of stroke 06/18/2011  . PFO (patent foramen ovale) 06/18/2011  . Status post gastric bypass for obesity 06/18/2011  . PELVIC PAIN, CHRONIC 07/20/2008  . FIBROIDS, UTERUS 07/16/2008  . ASTHMA 07/16/2008  . FIBROMYALGIA 07/16/2008  . CARPAL TUNNEL SYNDROME, HX OF 07/16/2008  . History of pulmonary embolus (PE) 07/16/2008    Past Surgical History:  Procedure Laterality Date  . ABDOMINAL HYSTERECTOMY  11/1997   complete  . ANTERIOR CERVICAL DECOMP/DISCECTOMY FUSION  02/05/2005   C5-6  . APPENDECTOMY  10/22/2008   laparoscopic  . BREAST LUMPECTOMY WITH RADIOACTIVE SEED AND SENTINEL  LYMPH NODE BIOPSY Left 07/26/2016   Procedure: BREAST LUMPECTOMY WITH RADIOACTIVE SEED AND SENTINEL LYMPH NODE BIOPSY;  Surgeon: Erroll Luna, MD;  Location: [facility];  Service: General;  Laterality: Left;  . BUNIONECTOMY  05/1980   both feet  . BUNIONECTOMY  08/2011   left foot  . CARDIAC CATHETERIZATION  03/04/2002  . CARPAL TUNNEL RELEASE  06/21/2009   right  . CARPAL TUNNEL RELEASE     left hand  . CARPAL TUNNEL RELEASE  10/03/2011   Procedure: CARPAL TUNNEL RELEASE;  Surgeon: Wynonia Sours, MD;  Location: [place];  Service: Orthopedics;  Laterality: Right;  CARPAL  TUNNEL WITH HYPOTHENAR FAT PAD TRANSFER  . CERVICAL SPINE SURGERY  01/2005   titanium plate implanted  . CHOLECYSTECTOMY  1990  . ELBOW SURGERY  08/09/2004   decompression ulnar nerve right elbow  . ENTEROLYSIS  10/22/2008   laparoscopic abd. enterolysis  . GASTRIC ROUX-EN-Y  2009  . HEEL SPUR SURGERY  08/1997   left  . HEMORRHOID SURGERY  03/1993  . LAPAROSCOPIC LYSIS INTESTINAL ADHESIONS  02/14/2000  . NAILBED REPAIR  01/10/2005; 08/2011   exc. matrix bilat. great toe  . OTHER SURGICAL HISTORY  12/1986   pt states that she had surgery to unclog her fallopean tubes  . PORTACATH PLACEMENT N/A 09/24/2016   Procedure: INSERTION PORT-A-CATH WITH [name];  Surgeon: Erroll Luna, MD;  Location: [place];  Service: General;  Laterality: N/A;  . SHOULDER SURGERY     bilat. - (left:  06/2005)  . TONSILLECTOMY  07/1995  . TRIGGER FINGER RELEASE  04/25/2006   decompression A-1 pulley left thumb  . UTERINE FIBROID SURGERY  12/95, 7/96   x2  . VENA CAVA FILTER PLACEMENT  2009   during Roux-en-Y surg.     OB History   None      Home Medications    Prior to Admission medications   Medication Sig Start Date End Date Taking? Authorizing Provider  acetaminophen (TYLENOL) 500 MG tablet Take 1,000 mg by mouth every 6 (six) hours as needed (pain).    [provider]  acetaminophen-codeine (TYLENOL #3) 300-30 MG tablet Take 1 tablet by mouth every 4 (four) hours as needed for moderate pain. Patient not taking: Reported on 08/12/2017 06/13/17   Nicholas Lose, MD  albuterol [facility] HFA) 108 (90 Base) MCG/ACT inhaler Inhale 2 puffs into the lungs every 6 (six) hours as needed for wheezing or shortness of breath.    [provider]  Calcium Citrate-Vitamin D (CALCIUM CITRATE +D PO) Take 2 tablets by mouth daily. Calcium 600 mg     [provider]  cyclobenzaprine (FLEXERIL) 5 MG tablet Take 5 mg by mouth 3 (three) times daily as needed for muscle spasms.    [provider]    fluticasone (FLONASE) 50 MCG/ACT nasal spray Place 1 spray into both nostrils 2 (two) times daily. Patient not taking: Reported on 08/12/2017 02/22/17 02/22/18  Harle Stanford., PA-C  letrozole [facility]) 2.5 MG tablet Take 1 tablet (2.5 mg total) by mouth daily. 08/28/17   Nicholas Lose, MD  lisinopril (PRINIVIL,ZESTRIL) 20 MG tablet Take 20 mg by mouth daily.    [provider]  meclizine (ANTIVERT) 25 MG tablet Take 1 tablet (25 mg total) by mouth 3 (three) times daily as needed for dizziness. Patient not taking: Reported on 08/28/2017 08/12/17   Harle Stanford., PA-C  meloxicam (MOBIC) 7.5 MG tablet Take 1 tablet (7.5 mg total)  by mouth 2 (two) times daily. 07/25/17   Nicholas Lose, MD  mometasone (ELOCON) 0.1 % cream Apply 1 application topically daily as needed (rash - summer eczema). Apply to arms    [provider]  Multiple Vitamin (MULTIVITAMIN WITH MINERALS) TABS tablet Take 1 tablet by mouth at bedtime.    [provider]  omeprazole (PRILOSEC) 40 MG capsule Take 1 capsule (40 mg total) by mouth daily. Patient not taking: Reported on 08/28/2017 11/23/16   Gardenia Phlegm, NP  oxybutynin (DITROPAN) 5 MG tablet TAKE 1 TABLET (5 MG TOTAL) BY MOUTH 3 (THREE) TIMES DAILY. 06/14/17   Gardenia Phlegm, NP  pregabalin (LYRICA) 75 MG capsule Take 1 capsule (75 mg total) by mouth daily. 1 capsule daily, may increase to two capsules daily after 1 week 08/28/17   Hayden Pedro, PA-C  ranitidine (ZANTAC) 300 MG capsule Take 1 capsule (300 mg total) by mouth 2 (two) times daily. 11/14/16   Gardenia Phlegm, NP  silver sulfADIAZINE (SILVADENE) 1 % cream Apply 1 application topically 2 (two) times daily as needed (stomach tears). 11/09/16   Gardenia Phlegm, NP  sucralfate (CARAFATE) 1 g tablet Take 1 tablet (1 g total) by mouth 4 (four) times daily -  with meals and at bedtime. Patient not taking: Reported on 08/28/2017 11/23/16   Gardenia Phlegm, NP  Turmeric 500 MG CAPS Take by mouth.    [provider]  valACYclovir (VALTREX) 500 MG tablet Take 1 tablet by mouth as needed. 02/11/17   [provider]    Family History Family History  Problem Relation Age of Onset  . Breast cancer Paternal Aunt 58  . Cervical cancer Paternal Grandmother 44       d.40s  . Ovarian cancer Maternal Grandmother 23       d.23  . Colon polyps Father   . Diabetes Father        borderline  . Prostate cancer Father   . Hypertension Mother   . Hypertension Unknown   . Breast cancer Sister 27       treated with neoadjuvant chemo/radiation and lumpectomy    Social History Social History   Tobacco Use  . Smoking status: Former Research scientist (life sciences)  . Smokeless tobacco: Never Used  . Tobacco comment: quit smoking 08/1989  Substance Use Topics  . Alcohol use: No  . Drug use: No     Allergies   Aspirin; Oxycodone hcl; Propoxyphene n-acetaminophen; Tramadol; Adhesive [tape]; and Prednisone   Review of Systems Review of Systems  All other systems reviewed and are negative.    Physical Exam Updated Vital Signs BP 119/69 (BP Location: Left Arm)   Pulse 76   Temp 98 F (36.7 C) (Oral)   Resp 18   Ht 5' 1"  (1.549 m)   Wt 90.7 kg (200 lb)   SpO2 97%   BMI 37.79 kg/m   Physical Exam  Constitutional: She is oriented to person, place, and time. She appears well-developed and well-nourished.  HENT:  Head: Normocephalic and atraumatic.  Cardiovascular: Normal rate and regular rhythm.  Pulmonary/Chest: Effort normal. No respiratory distress.  Musculoskeletal:  2+ DP pulses in the left foot. There is ecchymosis to the third toe. There is tenderness to the third toe. There is no midfoot or lateral tenderness. Flexion extension intact in the ankle.  Neurological: She is alert and oriented to person, place, and time.  Skin: Skin is warm and dry. Capillary refill takes less than  2 seconds.  Psychiatric: She has a normal mood and  affect. Her behavior is normal.  Nursing note and vitals reviewed.    ED Treatments / Results  Labs (all labs ordered are listed, but only abnormal results are displayed) Labs Reviewed - No data to display  EKG None  Radiology Dg Foot Complete Left  Result Date: 09/01/2017 CLINICAL DATA:  Injury to the left foot with pain across the metatarsals history of bunionectomy EXAM: LEFT FOOT - COMPLETE 3+ VIEW COMPARISON:  None. FINDINGS: No acute displaced fracture is seen. Apparent prior resection of the head of the fifth metatarsal. Postsurgical changes of the first metatarsal. Mild lateral deviation of the distal shaft of the fifth metatarsal with respect to the base of the fifth proximal phalanx. No radiopaque foreign body. Small plantar calcaneal spur IMPRESSION: 1. No definite acute osseous abnormality 2. Postsurgical changes of the first and fifth metatarsals. Electronically Signed   By: Donavan Foil M.D.   On: 09/01/2017 22:03    Procedures Procedures (including critical care time)  Medications Ordered in ED Medications - No data to display   Initial Impression / Assessment and Plan / ED Course  I have reviewed the triage vital signs and the nursing notes.  Pertinent labs & imaging results that were available during my care of the patient were reviewed by me and considered in my medical decision making (see chart for details).     Patient here for evaluation of toe pain after an injury earlier today. She is neurovascular intact on examination. No concerns for LisFranc injury based on exam and mechanism. No evidence of fracture on imaging. Discussed with patient home care for foot contusion. Discussed weight-bearing as tolerated, Tylenol for pain. Discussed outpatient follow-up.  Final Clinical Impressions(s) / ED Diagnoses   Final diagnoses:  Contusion of lesser toe of left foot without damage to nail, initial encounter    ED Discharge Orders    None       Quintella Reichert, MD 09/01/17 2347

## 2017-09-01 NOTE — ED Triage Notes (Signed)
Injury to right foot after hitting foot under a step stool. Swelling noted to middle toe.

## 2017-09-20 DIAGNOSIS — M47816 Spondylosis without myelopathy or radiculopathy, lumbar region: Secondary | ICD-10-CM | POA: Diagnosis not present

## 2017-09-23 MED FILL — ORPHENADRINE 100 MG TAB SA: 100 | 30 days supply | Qty: 60 | Fill #0

## 2017-10-02 NOTE — Progress Notes (Signed)
HPI: Follow-up chest pain.  Apparently I have seen patient in the past but no records available.  Last seen by Kerin Ransom in March 2018.  Patient had a cardiac catheterization in 2003 that was normal.  She has a history of left brain CVA and patent foramen ovale.  She has a prior history of pulmonary embolus/DVT treated with Coumadin and eventually had IVC filter.  Nuclear study May 2017 showed no ischemia.  Echo 5/18 showed normal LV function, moderate diastolic dysfunction and mild biatrial enlargement. Since last seen, patient has dyspnea with more vigorous activities but not routine activities.  No orthopnea, PND, pedal edema, or syncope.  Patient continues to have occasional chest pain which is long-standing.  It is substernal without radiation.  Occurs both with exertion and at rest.  Can last 5 minutes to all day.  Current Outpatient Medications  Medication Sig Dispense Refill  . acetaminophen (TYLENOL) 500 MG tablet Take 1,000 mg by mouth every 6 (six) hours as needed (pain).    Marland Kitchen acetaminophen-codeine (TYLENOL #3) 300-30 MG tablet Take 1 tablet by mouth every 4 (four) hours as needed for moderate pain. (Patient not taking: Reported on 08/12/2017) 30 tablet 0  . albuterol (PROAIR HFA) 108 (90 Base) MCG/ACT inhaler Inhale 2 puffs into the lungs every 6 (six) hours as needed for wheezing or shortness of breath.    . Calcium Citrate-Vitamin D (CALCIUM CITRATE +D PO) Take 2 tablets by mouth daily. Calcium 600 mg     . cyclobenzaprine (FLEXERIL) 5 MG tablet Take 5 mg by mouth 3 (three) times daily as needed for muscle spasms.    . fluticasone (FLONASE) 50 MCG/ACT nasal spray Place 1 spray into both nostrils 2 (two) times daily. (Patient not taking: Reported on 08/12/2017) 16 g 5  . letrozole (FEMARA) 2.5 MG tablet Take 1 tablet (2.5 mg total) by mouth daily. 90 tablet 3  . lisinopril (PRINIVIL,ZESTRIL) 20 MG tablet Take 20 mg by mouth daily.    . meclizine (ANTIVERT) 25 MG tablet Take 1  tablet (25 mg total) by mouth 3 (three) times daily as needed for dizziness. (Patient not taking: Reported on 08/28/2017) 30 tablet 0  . meloxicam (MOBIC) 7.5 MG tablet Take 1 tablet (7.5 mg total) by mouth 2 (two) times daily. 60 tablet 0  . mometasone (ELOCON) 0.1 % cream Apply 1 application topically daily as needed (rash - summer eczema). Apply to arms    . Multiple Vitamin (MULTIVITAMIN WITH MINERALS) TABS tablet Take 1 tablet by mouth at bedtime.    Marland Kitchen omeprazole (PRILOSEC) 40 MG capsule Take 1 capsule (40 mg total) by mouth daily. (Patient not taking: Reported on 08/28/2017) 30 capsule 5  . oxybutynin (DITROPAN) 5 MG tablet TAKE 1 TABLET (5 MG TOTAL) BY MOUTH 3 (THREE) TIMES DAILY. 270 tablet 0  . pregabalin (LYRICA) 75 MG capsule Take 1 capsule (75 mg total) by mouth daily. 1 capsule daily, may increase to two capsules daily after 1 week 60 capsule 3  . ranitidine (ZANTAC) 300 MG capsule Take 1 capsule (300 mg total) by mouth 2 (two) times daily. 60 capsule 3  . silver sulfADIAZINE (SILVADENE) 1 % cream Apply 1 application topically 2 (two) times daily as needed (stomach tears). 50 g 0  . sucralfate (CARAFATE) 1 g tablet Take 1 tablet (1 g total) by mouth 4 (four) times daily -  with meals and at bedtime. (Patient not taking: Reported on 08/28/2017) 120 tablet 0  .  Turmeric 500 MG CAPS Take by mouth.    . valACYclovir (VALTREX) 500 MG tablet Take 1 tablet by mouth as needed.  1   No current facility-administered medications for this visit.      Past Medical History:  Diagnosis Date  . Anemia    since bypass  . Arthritis    osteoarthritis  . Asthma    states no asthma attack since 2002  . Breast cancer ([place]) 06/26/16 bx   left breast  . Chronic back pain   . Complication of anesthesia    states takes more than normal to put her to sleep  . Dental bridge present    upper  . Dental crowns present   . DVT of upper extremity (deep vein thrombosis) ([place])   . Fibromyalgia   . Genetic  testing 09/19/2016   Ms. Marotto underwent genetic counseling and testing for hereditary cancer syndromes on 08/21/2016. Her results were negative for mutations in all 46 genes analyzed by [facility]'s 46-gene Common Hereditary Cancers Panel. Genes analyzed include: APC, ATM, AXIN2, BARD1, BMPR1A, BRCA1, BRCA2, BRIP1, CDH1, CDKN2A, CHEK2, CTNNA1, DICER1, EPCAM, GREM1, HOXB13, KIT, MEN1, MLH1, MSH2, MSH3, MSH6, MUTYH, NBN,   . Headache(784.0)    migraines  . History of blood transfusion 06/2005  . History of gallstones   . History of pneumonia   . History of shingles 07/2011  . HTN (hypertension)   . Normal coronary arteries 2003  . PFO (patent foramen ovale)   . Presence of inferior vena cava filter   . Pulmonary embolism ([place])   . Sleep apnea    used CPAP until after bypass surg.  . Status post gastric bypass for obesity   . Stroke [facility]) 12/2002   right-sided weakness  . Urinary incontinence     Past Surgical History:  Procedure Laterality Date  . ABDOMINAL HYSTERECTOMY  11/1997   complete  . ANTERIOR CERVICAL DECOMP/DISCECTOMY FUSION  02/05/2005   C5-6  . APPENDECTOMY  10/22/2008   laparoscopic  . BREAST LUMPECTOMY WITH RADIOACTIVE SEED AND SENTINEL LYMPH NODE BIOPSY Left 07/26/2016   Procedure: BREAST LUMPECTOMY WITH RADIOACTIVE SEED AND SENTINEL LYMPH NODE BIOPSY;  Surgeon: Erroll Luna, MD;  Location: [place];  Service: General;  Laterality: Left;  . BUNIONECTOMY  05/1980   both feet  . BUNIONECTOMY  08/2011   left foot  . CARDIAC CATHETERIZATION  03/04/2002  . CARPAL TUNNEL RELEASE  06/21/2009   right  . CARPAL TUNNEL RELEASE     left hand  . CARPAL TUNNEL RELEASE  10/03/2011   Procedure: CARPAL TUNNEL RELEASE;  Surgeon: Wynonia Sours, MD;  Location: [place];  Service: Orthopedics;  Laterality: Right;  CARPAL TUNNEL WITH HYPOTHENAR FAT PAD TRANSFER  . CERVICAL SPINE SURGERY  01/2005   titanium plate implanted  . CHOLECYSTECTOMY  1990  . ELBOW SURGERY   08/09/2004   decompression ulnar nerve right elbow  . ENTEROLYSIS  10/22/2008   laparoscopic abd. enterolysis  . GASTRIC ROUX-EN-Y  2009  . HEEL SPUR SURGERY  08/1997   left  . HEMORRHOID SURGERY  03/1993  . LAPAROSCOPIC LYSIS INTESTINAL ADHESIONS  02/14/2000  . NAILBED REPAIR  01/10/2005; 08/2011   exc. matrix bilat. great toe  . OTHER SURGICAL HISTORY  12/1986   pt states that she had surgery to unclog her fallopean tubes  . PORTACATH PLACEMENT N/A 09/24/2016   Procedure: INSERTION PORT-A-CATH WITH [name];  Surgeon: Erroll Luna, MD;  Location: [place];  Service:  General;  Laterality: N/A;  . SHOULDER SURGERY     bilat. - (left:  06/2005)  . TONSILLECTOMY  07/1995  . TRIGGER FINGER RELEASE  04/25/2006   decompression A-1 pulley left thumb  . UTERINE FIBROID SURGERY  12/95, 7/96   x2  . VENA CAVA FILTER PLACEMENT  2009   during Roux-en-Y surg.    Social History   Socioeconomic History  . Marital status: Single    Spouse name: Not on file  . Number of children: 0  . Years of education: Not on file  . Highest education level: Not on file  Occupational History  . Occupation: disabled  Social Needs  . Financial resource strain: Not on file  . Food insecurity:    Worry: Not on file    Inability: Not on file  . Transportation needs:    Medical: Not on file    Non-medical: Not on file  Tobacco Use  . Smoking status: Former Research scientist (life sciences)  . Smokeless tobacco: Never Used  . Tobacco comment: quit smoking 08/1989  Substance and Sexual Activity  . Alcohol use: No  . Drug use: No  . Sexual activity: Never    Birth control/protection: Surgical  Lifestyle  . Physical activity:    Days per week: Not on file    Minutes per session: Not on file  . Stress: Not on file  Relationships  . Social connections:    Talks on phone: Not on file    Gets together: Not on file    Attends religious service: Not on file    Active member of club or organization: Not on file    Attends meetings of clubs or  organizations: Not on file    Relationship status: Not on file  . Intimate partner violence:    Fear of current or ex partner: Not on file    Emotionally abused: Not on file    Physically abused: Not on file    Forced sexual activity: Not on file  Other Topics Concern  . Not on file  Social History Narrative  . Not on file    Family History  Problem Relation Age of Onset  . Breast cancer Paternal Aunt 82  . Cervical cancer Paternal Grandmother 64       d.40s  . Ovarian cancer Maternal Grandmother 23       d.23  . Colon polyps Father   . Diabetes Father        borderline  . Prostate cancer Father   . Hypertension Mother   . Hypertension Unknown   . Breast cancer Sister 71       treated with neoadjuvant chemo/radiation and lumpectomy    ROS: no fevers or chills, productive cough, hemoptysis, dysphasia, odynophagia, melena, hematochezia, dysuria, hematuria, rash, seizure activity, orthopnea, PND, pedal edema, claudication. Remaining systems are negative.  Physical Exam: Well-developed obese in no acute distress.  Skin is warm and dry.  HEENT is normal.  Neck is supple.  Chest is clear to auscultation with normal expansion.  Cardiovascular exam is regular rate and rhythm.  Abdominal exam nontender or distended. No masses palpated. Extremities show no edema. neuro grossly intact  Electrocardiogram shows sinus rhythm at a rate of 61, nonspecific ST changes.  A/P  1 history of chest pain-symptoms are long-standing.  Previous catheterization revealed normal coronary arteries and previous nuclear study negative.  Electrocardiogram shows no ST changes.  Will not pursue further ischemia evaluation.  2 history of pulmonary embolus-patient was treated  with Coumadin until 2009.  She has an IVC filter in place.    3 hypertension-blood pressure is controlled.  Continue present medications.  4 prior CVA- She does have a history of patent foramen ovale in reviewing her chart.   Ordinarily we would consider closing this in the setting of prior CVA.  However it has been greater than 15 years since her previous CVA.  I will add aspirin 81 mg daily and we will follow.  5 prior gastric bypass surgery  Kirk Ruths, MD

## 2017-10-10 DIAGNOSIS — M25562 Pain in left knee: Secondary | ICD-10-CM | POA: Diagnosis not present

## 2017-10-10 DIAGNOSIS — M25561 Pain in right knee: Secondary | ICD-10-CM | POA: Diagnosis not present

## 2017-10-11 ENCOUNTER — Ambulatory Visit: Payer: Medicare Other | Admitting: Cardiology

## 2017-10-11 ENCOUNTER — Encounter: Payer: Self-pay | Admitting: Cardiology

## 2017-10-11 VITALS — BP 110/60 | HR 60 | Ht 61.0 in | Wt 208.8 lb

## 2017-10-11 DIAGNOSIS — R079 Chest pain, unspecified: Secondary | ICD-10-CM

## 2017-10-11 DIAGNOSIS — Q2112 Patent foramen ovale: Secondary | ICD-10-CM

## 2017-10-11 DIAGNOSIS — I10 Essential (primary) hypertension: Secondary | ICD-10-CM

## 2017-10-11 DIAGNOSIS — Q211 Atrial septal defect: Secondary | ICD-10-CM | POA: Diagnosis not present

## 2017-10-11 MED ORDER — ASPIRIN EC 81 MG PO TBEC: 81.0000 mg | DELAYED_RELEASE_TABLET | Freq: Every day | ORAL | 3 refills | Status: DC

## 2017-10-11 NOTE — Patient Instructions (Signed)
Medication Instructions:   START ASPIRIN 81 MG ONCE DAILY  Follow-Up:  Your physician wants you to follow-up in: ONE YEAR WITH DR CRENSHAW You will receive a reminder letter in the mail two months in advance. If you don't receive a letter, please call our office to schedule the follow-up appointment.   If you need a refill on your cardiac medications before your next appointment, please call your pharmacy.    

## 2017-10-22 DIAGNOSIS — Z853 Personal history of malignant neoplasm of breast: Secondary | ICD-10-CM | POA: Diagnosis not present

## 2017-10-24 MED FILL — diazePAM 10 MG TABS: 10 | 1 days supply | Qty: 1 | Fill #0

## 2017-10-28 DIAGNOSIS — M47816 Spondylosis without myelopathy or radiculopathy, lumbar region: Secondary | ICD-10-CM | POA: Diagnosis not present

## 2017-11-05 DIAGNOSIS — E7800 Pure hypercholesterolemia, unspecified: Secondary | ICD-10-CM | POA: Diagnosis not present

## 2017-11-05 DIAGNOSIS — I10 Essential (primary) hypertension: Secondary | ICD-10-CM | POA: Diagnosis not present

## 2017-11-05 DIAGNOSIS — M797 Fibromyalgia: Secondary | ICD-10-CM | POA: Diagnosis not present

## 2017-11-05 DIAGNOSIS — J4520 Mild intermittent asthma, uncomplicated: Secondary | ICD-10-CM | POA: Diagnosis not present

## 2017-11-14 ENCOUNTER — Telehealth: Payer: Self-pay | Admitting: Adult Health

## 2017-11-14 NOTE — Telephone Encounter (Signed)
Called patient and left msg re her 7/9 appt.  Moved to 2:30 per LC.  Mailed calendar.

## 2017-11-20 ENCOUNTER — Telehealth: Payer: Self-pay

## 2017-11-20 NOTE — Telephone Encounter (Signed)
Spoke with pt regarding SCP visit with NP on 11/26/17 at 2:30 pm.  Pt said she will come to appt.  However, moving forward she needs appt's scheduled for 1:30 pm d/t transportation needs.

## 2017-11-22 ENCOUNTER — Emergency Department (HOSPITAL_BASED_OUTPATIENT_CLINIC_OR_DEPARTMENT_OTHER)
Admission: EM | Admit: 2017-11-22 | Discharge: 2017-11-22 | Disposition: A | Discharge status: Home / Self Care | Payer: Medicare Other | Attending: Emergency Medicine | Admitting: Emergency Medicine

## 2017-11-22 ENCOUNTER — Other Ambulatory Visit: Payer: Self-pay

## 2017-11-22 ENCOUNTER — Encounter (HOSPITAL_BASED_OUTPATIENT_CLINIC_OR_DEPARTMENT_OTHER): Payer: Self-pay

## 2017-11-22 DIAGNOSIS — R21 Rash and other nonspecific skin eruption: Secondary | ICD-10-CM

## 2017-11-22 DIAGNOSIS — Z79899 Other long term (current) drug therapy: Secondary | ICD-10-CM | POA: Diagnosis not present

## 2017-11-22 DIAGNOSIS — Z86718 Personal history of other venous thrombosis and embolism: Secondary | ICD-10-CM | POA: Diagnosis not present

## 2017-11-22 DIAGNOSIS — J45909 Unspecified asthma, uncomplicated: Secondary | ICD-10-CM | POA: Diagnosis not present

## 2017-11-22 DIAGNOSIS — Z7982 Long term (current) use of aspirin: Secondary | ICD-10-CM | POA: Diagnosis not present

## 2017-11-22 DIAGNOSIS — Z8673 Personal history of transient ischemic attack (TIA), and cerebral infarction without residual deficits: Secondary | ICD-10-CM | POA: Diagnosis not present

## 2017-11-22 DIAGNOSIS — R51 Headache: Secondary | ICD-10-CM | POA: Diagnosis not present

## 2017-11-22 DIAGNOSIS — Z853 Personal history of malignant neoplasm of breast: Secondary | ICD-10-CM | POA: Insufficient documentation

## 2017-11-22 DIAGNOSIS — Z87891 Personal history of nicotine dependence: Secondary | ICD-10-CM | POA: Diagnosis not present

## 2017-11-22 DIAGNOSIS — I10 Essential (primary) hypertension: Secondary | ICD-10-CM | POA: Insufficient documentation

## 2017-11-22 DIAGNOSIS — M542 Cervicalgia: Secondary | ICD-10-CM | POA: Diagnosis not present

## 2017-11-22 MED ORDER — ACETAMINOPHEN-CODEINE #3 300-30 MG PO TABS
1.0000 | ORAL_TABLET | Freq: Once | ORAL | Status: AC
  Administered 2017-11-22: 1 via ORAL
  Filled 2017-11-22: qty 1

## 2017-11-22 MED ORDER — MUPIROCIN CALCIUM 2 % EX CREA: 1.0000 "application " | TOPICAL_CREAM | Freq: Two times a day (BID) | CUTANEOUS | 0 refills | Status: DC

## 2017-11-22 MED ORDER — ACETAMINOPHEN-CODEINE #3 300-30 MG PO TABS: 1.0000 | ORAL_TABLET | Freq: Four times a day (QID) | ORAL | 0 refills | Status: DC | PRN

## 2017-11-22 MED ORDER — VALACYCLOVIR HCL 1 G PO TABS: 1000.0000 mg | ORAL_TABLET | Freq: Three times a day (TID) | ORAL | 0 refills | Status: DC

## 2017-11-22 MED FILL — valACYclovir HCL 1 GM TABS: 1 | 7 days supply | Qty: 21 | Fill #0

## 2017-11-22 MED FILL — MUPIROCIN 2% OINTMENT: 2 | 10 days supply | Qty: 22 | Fill #0

## 2017-11-22 MED FILL — ACETAMINOPHEN/COD #3 TABLET: 300-30 | 2 days supply | Qty: 15 | Fill #0

## 2017-11-22 NOTE — ED Notes (Signed)
Pt verbalizes understanding of dc instructions, states the EDP was supposed to order her some pain medication, informed EDP, awaiting order

## 2017-11-22 NOTE — Discharge Instructions (Signed)
Increase dosing of Valtrex for the next week as prescribed.  Apply Bactroban cream twice daily for 1 week as well.  Please follow-up with your doctor in 2 to 3 days for recheck.  Please return to the emergency department if you develop any new or worsening symptoms including increasing pain, redness, swelling, drainage, red streaking from the area, fever over 100.4, or any other concerning symptom.

## 2017-11-22 NOTE — ED Triage Notes (Signed)
C/o possible abscess behind left ear x 2 days-NAD-steady gait with own cane

## 2017-11-22 NOTE — ED Provider Notes (Signed)
[facility] EMERGENCY DEPARTMENT Provider Note   CSN: [id number] Arrival date & time: 11/22/17  1420     History   Chief Complaint Chief Complaint  Patient presents with  . Abscess    HPI [name] is a 57 y.o. female with history of breast cancer currently in remission, herpes zoster on suppression Valtrex, DVT, PE who presents with a 2-day history of painful rash/blistering behind her left ear.  She has had some radiation of pain to her neck as well as a headache.  She denies any fevers, chest pain, shortness of breath, abdominal pain, nausea, vomiting.  She currently takes 500 mg Valtrex daily for suppression.  Her normal shingles outbreak is on her right buttock.  HPI  Past Medical History:  Diagnosis Date  . Anemia    since bypass  . Arthritis    osteoarthritis  . Asthma    states no asthma attack since 2002  . Breast cancer ([place]) 06/26/16 bx   left breast  . Chronic back pain   . Complication of anesthesia    states takes more than normal to put her to sleep  . Dental bridge present    upper  . Dental crowns present   . DVT of upper extremity (deep vein thrombosis) ([place])   . Fibromyalgia   . Genetic testing 09/19/2016   Ms. Senseney underwent genetic counseling and testing for hereditary cancer syndromes on 08/21/2016. Her results were negative for mutations in all 46 genes analyzed by [employer]'s 46-gene Common Hereditary Cancers Panel. Genes analyzed include: APC, ATM, AXIN2, BARD1, BMPR1A, BRCA1, BRCA2, BRIP1, CDH1, CDKN2A, CHEK2, CTNNA1, DICER1, EPCAM, GREM1, HOXB13, KIT, MEN1, MLH1, MSH2, MSH3, MSH6, MUTYH, NBN,   . Headache(784.0)    migraines  . History of blood transfusion 06/2005  . History of gallstones   . History of pneumonia   . History of shingles 07/2011  . HTN (hypertension)   . Normal coronary arteries 2003  . PFO (patent foramen ovale)   . Presence of inferior vena cava filter   . Pulmonary embolism ([place])   . Sleep apnea    used CPAP  until after bypass surg.  . Status post gastric bypass for obesity   . Stroke [facility]) 12/2002   right-sided weakness  . Urinary incontinence     Patient Active Problem List   Diagnosis Date Noted  . Chemotherapy-induced peripheral neuropathy ([place]) 02/15/2017  . Port catheter in place 11/02/2016  . Genetic testing 09/19/2016  . Pre-operative clearance 07/23/2016  . Malignant neoplasm of lower-outer quadrant of left breast of female, estrogen receptor positive ([place]) 07/10/2016  . Essential hypertension   . Chest pain 09/30/2015  . DVT, lower extremity ([place]) 06/18/2011  . DVT of upper extremity (deep vein thrombosis) ([place]) 06/18/2011  . [place] filter in place 06/18/2011  . History of stroke 06/18/2011  . PFO (patent foramen ovale) 06/18/2011  . Status post gastric bypass for obesity 06/18/2011  . PELVIC PAIN, CHRONIC 07/20/2008  . FIBROIDS, UTERUS 07/16/2008  . ASTHMA 07/16/2008  . FIBROMYALGIA 07/16/2008  . CARPAL TUNNEL SYNDROME, HX OF 07/16/2008  . History of pulmonary embolus (PE) 07/16/2008    Past Surgical History:  Procedure Laterality Date  . ABDOMINAL HYSTERECTOMY  11/1997   complete  . ANTERIOR CERVICAL DECOMP/DISCECTOMY FUSION  02/05/2005   C5-6  . APPENDECTOMY  10/22/2008   laparoscopic  . BREAST LUMPECTOMY WITH RADIOACTIVE SEED AND SENTINEL LYMPH NODE BIOPSY Left 07/26/2016   Procedure: BREAST LUMPECTOMY WITH RADIOACTIVE SEED  AND SENTINEL LYMPH NODE BIOPSY;  Surgeon: Erroll Luna, MD;  Location: [facility];  Service: General;  Laterality: Left;  . BUNIONECTOMY  05/1980   both feet  . BUNIONECTOMY  08/2011   left foot  . CARDIAC CATHETERIZATION  03/04/2002  . CARPAL TUNNEL RELEASE  06/21/2009   right  . CARPAL TUNNEL RELEASE     left hand  . CARPAL TUNNEL RELEASE  10/03/2011   Procedure: CARPAL TUNNEL RELEASE;  Surgeon: Wynonia Sours, MD;  Location: [place];  Service: Orthopedics;  Laterality: Right;  CARPAL TUNNEL WITH HYPOTHENAR FAT  PAD TRANSFER  . CERVICAL SPINE SURGERY  01/2005   titanium plate implanted  . CHOLECYSTECTOMY  1990  . ELBOW SURGERY  08/09/2004   decompression ulnar nerve right elbow  . ENTEROLYSIS  10/22/2008   laparoscopic abd. enterolysis  . GASTRIC ROUX-EN-Y  2009  . HEEL SPUR SURGERY  08/1997   left  . HEMORRHOID SURGERY  03/1993  . LAPAROSCOPIC LYSIS INTESTINAL ADHESIONS  02/14/2000  . NAILBED REPAIR  01/10/2005; 08/2011   exc. matrix bilat. great toe  . OTHER SURGICAL HISTORY  12/1986   pt states that she had surgery to unclog her fallopean tubes  . PORTACATH PLACEMENT N/A 09/24/2016   Procedure: INSERTION PORT-A-CATH WITH [name];  Surgeon: Erroll Luna, MD;  Location: [facility];  Service: General;  Laterality: N/A;  . SHOULDER SURGERY     bilat. - (left:  06/2005)  . TONSILLECTOMY  07/1995  . TRIGGER FINGER RELEASE  04/25/2006   decompression A-1 pulley left thumb  . UTERINE FIBROID SURGERY  12/95, 7/96   x2  . VENA CAVA FILTER PLACEMENT  2009   during Roux-en-Y surg.     OB History   None      Home Medications    Prior to Admission medications   Medication Sig Start Date End Date Taking? Authorizing Provider  acetaminophen (TYLENOL) 500 MG tablet Take 1,000 mg by mouth every 6 (six) hours as needed (pain).    [provider]  acetaminophen-codeine (TYLENOL #3) 300-30 MG tablet Take 1-2 tablets by mouth every 6 (six) hours as needed for moderate pain. 11/22/17   Cyncere Ruhe, Bea Graff, PA-C  albuterol (PROAIR HFA) 108 (90 Base) MCG/ACT inhaler Inhale 2 puffs into the lungs every 6 (six) hours as needed for wheezing or shortness of breath.    [provider]  aspirin EC 81 MG tablet Take 1 tablet (81 mg total) by mouth daily. 10/11/17   Lelon Perla, MD  Calcium Citrate-Vitamin D (CALCIUM CITRATE +D PO) Take 2 tablets by mouth daily. Calcium 600 mg     [provider]  letrozole (FEMARA) 2.5 MG tablet Take 1 tablet (2.5 mg total) by mouth daily. 08/28/17   Nicholas Lose, MD    lisinopril (PRINIVIL,ZESTRIL) 20 MG tablet Take 20 mg by mouth daily.    [provider]  mometasone (ELOCON) 0.1 % cream Apply 1 application topically daily as needed (rash - summer eczema). Apply to arms    [provider]  Multiple Vitamin (MULTIVITAMIN WITH MINERALS) TABS tablet Take 1 tablet by mouth at bedtime.    [provider]  mupirocin cream (BACTROBAN) 2 % Apply 1 application topically 2 (two) times daily. 11/22/17   Elisha Mcgruder, Bea Graff, PA-C  orphenadrine (NORFLEX) 100 MG tablet Take 100 mg by mouth 2 (two) times daily.    [provider]  oxybutynin (DITROPAN) 5 MG tablet TAKE 1 TABLET (5  MG TOTAL) BY MOUTH 3 (THREE) TIMES DAILY. 06/14/17   Gardenia Phlegm, NP  ranitidine (ZANTAC) 300 MG capsule Take 1 capsule (300 mg total) by mouth 2 (two) times daily. 11/14/16   Gardenia Phlegm, NP  silver sulfADIAZINE (SILVADENE) 1 % cream Apply 1 application topically 2 (two) times daily as needed (stomach tears). 11/09/16   [place]ia Phlegm, NP  Turmeric 500 MG CAPS Take by mouth.    [provider]  valACYclovir (VALTREX) 1000 MG tablet Take 1 tablet (1,000 mg total) by mouth 3 (three) times daily. 11/22/17   Brayden Brodhead, Bea Graff, PA-C  valACYclovir (VALTREX) 500 MG tablet Take 1 tablet by mouth as needed. 02/11/17   [provider]    Family History Family History  Problem Relation Age of Onset  . Breast cancer Paternal Aunt 27  . Cervical cancer Paternal Grandmother 40       d.40s  . Ovarian cancer Maternal Grandmother 23       d.23  . Colon polyps Father   . Diabetes Father        borderline  . Prostate cancer Father   . Hypertension Mother   . Hypertension Unknown   . Breast cancer Sister 25       treated with neoadjuvant chemo/radiation and lumpectomy    Social History Social History   Tobacco Use  . Smoking status: Former Research scientist (life sciences)  . Smokeless tobacco: Never Used  . Tobacco comment: quit smoking 08/1989   Substance Use Topics  . Alcohol use: No  . Drug use: No     Allergies   Aspirin; Oxycodone hcl; Propoxyphene n-acetaminophen; Tramadol; Adhesive [tape]; and Prednisone   Review of Systems Review of Systems  Constitutional: Negative for fever.  HENT: Negative for ear pain (only behind the ear).   Musculoskeletal: Positive for neck pain.  Skin: Positive for rash.  Neurological: Positive for headaches.     Physical Exam Updated Vital Signs BP 113/72 (BP Location: Right Arm)   Pulse 64   Temp 98.4 F (36.9 C) (Oral)   Resp 18   Ht _0  (1.549 m)   Wt 93.9 kg (207 lb)   SpO2 100%   BMI 39.11 kg/m   Physical Exam  Constitutional: She appears well-developed and well-nourished. No distress.  HENT:  Head: Normocephalic and atraumatic.    Mouth/Throat: Oropharynx is clear and moist. No oropharyngeal exudate.  Eyes: Pupils are equal, round, and reactive to light. Conjunctivae are normal. Right eye exhibits no discharge. Left eye exhibits no discharge. No scleral icterus.  Neck: Normal range of motion. Neck supple. No thyromegaly present.  Cardiovascular: Normal rate, regular rhythm, normal heart sounds and intact distal pulses. Exam reveals no gallop and no friction rub.  No murmur heard. Pulmonary/Chest: Effort normal and breath sounds normal. No stridor. No respiratory distress. She has no wheezes. She has no rales.  Abdominal: Soft. Bowel sounds are normal. She exhibits no distension. There is no tenderness. There is no rebound and no guarding.  Musculoskeletal: She exhibits no edema.  Lymphadenopathy:    She has no cervical adenopathy.  Neurological: She is alert. Coordination normal.  Skin: Skin is warm and dry. No rash noted. She is not diaphoretic. No pallor.  Psychiatric: She has a normal mood and affect.  Nursing note and vitals reviewed.        ED Treatments / Results  Labs (all labs ordered are listed, but only abnormal results are displayed) Labs  Reviewed - No data  to display  EKG None  Radiology No results found.  Procedures Procedures (including critical care time)  Medications Ordered in ED Medications  acetaminophen-codeine (TYLENOL #3) 300-30 MG per tablet 1 tablet (1 tablet Oral Given 11/22/17 1555)     Initial Impression / Assessment and Plan / ED Course  I have reviewed the triage vital signs and the nursing notes.  Pertinent labs & imaging results that were available during my care of the patient were reviewed by me and considered in my medical decision making (see chart for details).     Patient's presentation concerning for herpes zoster, but will also cover for folliculitis.  Will increase patient's suppression dose of Valtrex for 1 week to full dosing, as well as mupirocin for folliculitis.  Will discharge home with short course of Tylenol 3 for pain control.  I reviewed the [place] narcotic database and found no discrepancies.  Follow-up to PCP for recheck in 2 to 3 days.  Return precautions discussed.  Patient understands and agrees with plan.  Patient vitals stable throughout ED course and discharged in satisfactory condition.  Patient also evaluated by Dr. Johnney Killian who guided the patient's management and agrees with plan.  Final Clinical Impressions(s) / ED Diagnoses   Final diagnoses:  Rash and nonspecific skin eruption    ED Discharge Orders        Ordered    valACYclovir (VALTREX) 1000 MG tablet  3 times daily     11/22/17 1542    mupirocin cream (BACTROBAN) 2 %  2 times daily     11/22/17 1542    acetaminophen-codeine (TYLENOL #3) 300-30 MG tablet  Every 6 hours PRN     11/22/17 1551       LawBea Graff, PA-C 11/22/17 1643    Charlesetta Shanks, MD 11/22/17 2308

## 2017-11-22 NOTE — ED Provider Notes (Signed)
Medical screening examination/treatment/procedure(s) were conducted as a shared visit with non-physician practitioner(s) and myself.  I personally evaluated the patient during the encounter.  None With area of painful blistering behind her left ear.  She is chronically on 500 daily Valtrex for suppression of shingles.  Patient reports in association with that she has headache on the side of her head, no visual changes, no fever.  Patient is alert and appropriate.  She has a small patch of vesicular lesions as imaged in PA-C chart.  Her neck is supple.  Ear and pinna are free of any lesions.  Most suspicious for early shingles outbreak.  Will have patient increase her Valtrex to dosing for active shingles.  I agree with plan of management.   Charlesetta Shanks, MD 11/22/17 [id number]

## 2017-11-26 ENCOUNTER — Inpatient Hospital Stay: Payer: Medicare Other | Attending: Adult Health | Admitting: Adult Health

## 2017-11-26 ENCOUNTER — Telehealth: Payer: Self-pay | Admitting: Adult Health

## 2017-11-26 ENCOUNTER — Inpatient Hospital Stay: Payer: Medicare Other

## 2017-11-26 VITALS — BP 128/65 | HR 78 | Temp 98.7°F | Resp 18 | Ht 61.0 in | Wt 207.7 lb

## 2017-11-26 DIAGNOSIS — Z923 Personal history of irradiation: Secondary | ICD-10-CM

## 2017-11-26 DIAGNOSIS — Z79811 Long term (current) use of aromatase inhibitors: Secondary | ICD-10-CM | POA: Diagnosis not present

## 2017-11-26 DIAGNOSIS — C50512 Malignant neoplasm of lower-outer quadrant of left female breast: Secondary | ICD-10-CM | POA: Diagnosis not present

## 2017-11-26 DIAGNOSIS — Z9221 Personal history of antineoplastic chemotherapy: Secondary | ICD-10-CM

## 2017-11-26 DIAGNOSIS — Z170 Estrogen receptor positive status [ER+]: Secondary | ICD-10-CM

## 2017-11-26 DIAGNOSIS — E2839 Other primary ovarian failure: Secondary | ICD-10-CM

## 2017-11-26 DIAGNOSIS — G629 Polyneuropathy, unspecified: Secondary | ICD-10-CM | POA: Diagnosis not present

## 2017-11-26 LAB — CBC WITH DIFFERENTIAL (CANCER CENTER ONLY)
Basophils Absolute: 0 10*3/uL (ref 0.0–0.1)
Basophils Relative: 0 %
Eosinophils Absolute: 0.1 10*3/uL (ref 0.0–0.5)
Eosinophils Relative: 2 %
HCT: 34.8 % (ref 34.8–46.6)
Hemoglobin: 11.5 g/dL — ABNORMAL LOW (ref 11.6–15.9)
Lymphocytes Relative: 31 %
Lymphs Abs: 1.3 10*3/uL (ref 0.9–3.3)
MCH: 31.8 pg (ref 25.1–34.0)
MCHC: 33 g/dL (ref 31.5–36.0)
MCV: 96.1 fL (ref 79.5–101.0)
Monocytes Absolute: 0.4 10*3/uL (ref 0.1–0.9)
Monocytes Relative: 9 %
Neutro Abs: 2.5 10*3/uL (ref 1.5–6.5)
Neutrophils Relative %: 58 %
Platelet Count: 192 10*3/uL (ref 145–400)
RBC: 3.62 MIL/uL — ABNORMAL LOW (ref 3.70–5.45)
RDW: 14.5 % (ref 11.2–14.5)
WBC Count: 4.3 10*3/uL (ref 3.9–10.3)

## 2017-11-26 LAB — CMP (CANCER CENTER ONLY)
ALT: 14 U/L (ref 0–44)
AST: 17 U/L (ref 15–41)
Albumin: 4.3 g/dL (ref 3.5–5.0)
Alkaline Phosphatase: 86 U/L (ref 38–126)
Anion gap: 8 (ref 5–15)
BUN: 13 mg/dL (ref 6–20)
CO2: 27 mmol/L (ref 22–32)
Calcium: 9.3 mg/dL (ref 8.9–10.3)
Chloride: 103 mmol/L (ref 98–111)
Creatinine: 0.77 mg/dL (ref 0.44–1.00)
GFR, Est AFR Am: >60 mL/min (ref >60)
GFR, Estimated: >60 mL/min (ref >60)
Glucose, Bld: 80 mg/dL (ref 70–99)
Potassium: 4.2 mmol/L (ref 3.5–5.1)
Sodium: 138 mmol/L (ref 135–145)
Total Bilirubin: 0.4 mg/dL (ref 0.3–1.2)
Total Protein: 7.3 g/dL (ref 6.5–8.1)

## 2017-11-26 NOTE — Telephone Encounter (Signed)
Gave patient avs and calendar of upcoming jan appts. °

## 2017-11-26 NOTE — Patient Instructions (Signed)
Bone Health Bones protect organs, store calcium, and anchor muscles. Good health habits, such as eating nutritious foods and exercising regularly, are important for maintaining healthy bones. They can also help to prevent a condition that causes bones to lose density and become weak and brittle (osteoporosis). Why is bone mass important? Bone mass refers to the amount of bone tissue that you have. The higher your bone mass, the stronger your bones. An important step toward having healthy bones throughout life is to have strong and dense bones during childhood. A young adult who has a high bone mass is more likely to have a high bone mass later in life. Bone mass at its greatest it is called peak bone mass. A large decline in bone mass occurs in older adults. In women, it occurs about the time of menopause. During this time, it is important to practice good health habits, because if more bone is lost than what is replaced, the bones will become less healthy and more likely to break (fracture). If you find that you have a low bone mass, you may be able to prevent osteoporosis or further bone loss by changing your diet and lifestyle. How can I find out if my bone mass is low? Bone mass can be measured with an X-ray test that is called a bone mineral density (BMD) test. This test is recommended for all women who are age 65 or older. It may also be recommended for men who are age 70 or older, or for people who are more likely to develop osteoporosis due to:  Having bones that break easily.  Having a long-term disease that weakens bones, such as kidney disease or rheumatoid arthritis.  Having menopause earlier than normal.  Taking medicine that weakens bones, such as steroids, thyroid hormones, or hormone treatment for breast cancer or prostate cancer.  Smoking.  Drinking three or more alcoholic drinks each day.  What are the nutritional recommendations for healthy bones? To have healthy bones, you  need to get enough of the right minerals and vitamins. Most nutrition experts recommend getting these nutrients from the foods that you eat. Nutritional recommendations vary from person to person. Ask your health care provider what is healthy for you. Here are some general guidelines. Calcium Recommendations Calcium is the most important (essential) mineral for bone health. Most people can get enough calcium from their diet, but supplements may be recommended for people who are at risk for osteoporosis. Good sources of calcium include:  Dairy products, such as low-fat or nonfat milk, cheese, and yogurt.  Dark green leafy vegetables, such as bok choy and broccoli.  Calcium-fortified foods, such as orange juice, cereal, bread, soy beverages, and tofu products.  Nuts, such as almonds.  Follow these recommended amounts for daily calcium intake:  Children, age 1?3: 700 mg.  Children, age 4?8: 1,000 mg.  Children, age 9?13: 1,300 mg.  Teens, age 14?18: 1,300 mg.  Adults, age 19?50: 1,000 mg.  Adults, age 51?70: ? Men: 1,000 mg. ? Women: 1,200 mg.  Adults, age 71 or older: 1,200 mg.  Pregnant and breastfeeding females: ? Teens: 1,300 mg. ? Adults: 1,000 mg.  Vitamin D Recommendations Vitamin D is the most essential vitamin for bone health. It helps the body to absorb calcium. Sunlight stimulates the skin to make vitamin D, so be sure to get enough sunlight. If you live in a cold climate or you do not get outside often, your health care provider may recommend that you take vitamin   D supplements. Good sources of vitamin D in your diet include:  Egg yolks.  Saltwater fish.  Milk and cereal fortified with vitamin D.  Follow these recommended amounts for daily vitamin D intake:  Children and teens, age 1?18: 600 international units.  Adults, age 50 or younger: 400-800 international units.  Adults, age 51 or older: 800-1,000 international units.  Other Nutrients Other nutrients  for bone health include:  Phosphorus. This mineral is found in meat, poultry, dairy foods, nuts, and legumes. The recommended daily intake for adult men and adult women is 700 mg.  Magnesium. This mineral is found in seeds, nuts, dark green vegetables, and legumes. The recommended daily intake for adult men is 400?420 mg. For adult women, it is 310?320 mg.  Vitamin K. This vitamin is found in green leafy vegetables. The recommended daily intake is 120 mg for adult men and 90 mg for adult women.  What type of physical activity is best for building and maintaining healthy bones? Weight-bearing and strength-building activities are important for building and maintaining peak bone mass. Weight-bearing activities cause muscles and bones to work against gravity. Strength-building activities increases muscle strength that supports bones. Weight-bearing and muscle-building activities include:  Walking and hiking.  Jogging and running.  Dancing.  Gym exercises.  Lifting weights.  Tennis and racquetball.  Climbing stairs.  Aerobics.  Adults should get at least 30 minutes of moderate physical activity on most days. Children should get at least 60 minutes of moderate physical activity on most days. Ask your health care provide what type of exercise is best for you. Where can I find more information? For more information, check out the following websites:  National Osteoporosis Foundation: http://nof.org/learn/basics  National Institutes of Health: http://www.niams.nih.gov/Health_Info/Bone/Bone_Health/bone_health_for_life.asp  This information is not intended to replace advice given to you by your health care provider. Make sure you discuss any questions you have with your health care provider. Document Released: 07/28/2003 Document Revised: 11/25/2015 Document Reviewed: 05/12/2014 Elsevier Interactive Patient Education  2018 [employer]  

## 2017-11-27 ENCOUNTER — Encounter: Payer: Self-pay | Admitting: Adult Health

## 2017-11-27 NOTE — Progress Notes (Signed)
CLINIC:  Survivorship   REASON FOR VISIT:  Routine follow-up post-treatment for a recent history of breast cancer.  BRIEF ONCOLOGIC HISTORY:    Malignant neoplasm of lower-outer quadrant of left breast of female, estrogen receptor positive ([place])   06/26/2016 Initial Diagnosis    Left breast biopsy 3:00: IDC with DCIS, grade 3, ER 80%, PR 20%, Ki-67 60%, HER-2 negative ratio 1.18; palpable lump: 1.8 cm lesion, no lymph nodes, T1 cN0 stage I a clinical stage      07/26/2016 Surgery    Left lumpectomy: IDC 1.8 cm, with DCIS, margins negative, 0/2 lymph nodes, ER 80%, PR 20%, HER-2 negative ratio 1.18, Ki-67 60%, T1cN0 stage IA       08/21/2016 Oncotype testing    Oncotype DX recurrence score 51, risk of recurrence 34%      08/21/2016 Genetic Testing    Genetic counseling and testing for hereditary cancer syndromes performed on 08/21/2016. Results are negative for pathogenic mutations in 46 genes analyzed by [facility]'s Common Hereditary Cancers Panel. Results are dated 09/14/2016. Genes tested: APC, ATM, AXIN2, BARD1, BMPR1A, BRCA1, BRCA2, BRIP1, CDH1, CDKN2A, CHEK2, CTNNA1, DICER1, EPCAM, GREM1, HOXB13, KIT, MEN1, MLH1, MSH2, MSH3, MSH6, MUTYH, NBN, NF1, NTHL1, PALB2, PDGFRA, PMS2, POLD1, POLE, PTEN, RAD50, RAD51C, RAD51D, SDHA, SDHB, SDHC, SDHD, SMAD4, SMARCA4, STK11, TP53, TSC1, TSC2, and VHL.        11/02/2016 - 04/19/2017 Chemotherapy    Dose dense Adriamycin and Cytoxan 4 followed by Taxol weekly 12      06/10/2017 - 07/26/2017 Radiation Therapy    Adjuvant radiation with Dr. Lisbeth Renshaw      08/2017 -  Anti-estrogen oral therapy    Letrozole daily       INTERVAL HISTORY:  [name] presents to the [facility] today for our initial meeting to review her survivorship care plan detailing her treatment course for breast cancer, as well as monitoring long-term side effects of that treatment, education regarding health maintenance, screening, and overall wellness and health  promotion.     Overall, [name] reports feeling well.  She is taking Letrozole daily and the only side effect that she notes is joint aches.  These are tolerable and manageable.  She is doing well otherwise.  She notes that she is exercising and making healthy choices with eating.   [name] is also experiencing continued peripheral neuropathy.  It is slightly improved.  She notes that she will feel like ants are crawling on her feet intermittently throughout the day.  This will last for several minutes then resolve.  She denies motor deficits due to this.      REVIEW OF SYSTEMS:  Review of Systems  Constitutional: Negative for appetite change, chills, fatigue, fever and unexpected weight change.  HENT:   Negative for hearing loss and lump/mass.   Eyes: Negative for eye problems and icterus.  Respiratory: Negative for chest tightness, cough and shortness of breath.   Cardiovascular: Negative for chest pain, leg swelling and palpitations.  Gastrointestinal: Negative for abdominal distention, abdominal pain, constipation, diarrhea, nausea and vomiting.  Endocrine: Negative for hot flashes.  Skin: Negative for itching and rash.  Neurological: Negative for dizziness, extremity weakness, headaches and numbness.  Hematological: Negative for adenopathy. Does not bruise/bleed easily.  Psychiatric/Behavioral: Negative for depression. The patient is not nervous/anxious.   Breast: Denies any new nodularity, masses, tenderness, nipple changes, or nipple discharge.      ONCOLOGY TREATMENT TEAM:  1. Surgeon:  Dr. Brantley Stage at [facility] 2.  Medical Oncologist: Dr. Lindi Adie  3. Radiation Oncologist: Dr. Lisbeth Renshaw    PAST MEDICAL/SURGICAL HISTORY:  Past Medical History:  Diagnosis Date  . Anemia    since bypass  . Arthritis    osteoarthritis  . Asthma    states no asthma attack since 2002  . Breast cancer ([place]) 06/26/16 bx   left breast  . Chronic back pain   . Complication of  anesthesia    states takes more than normal to put her to sleep  . Dental bridge present    upper  . Dental crowns present   . DVT of upper extremity (deep vein thrombosis) ([place])   . Fibromyalgia   . Genetic testing 09/19/2016   Ms. Jackson underwent genetic counseling and testing for hereditary cancer syndromes on 08/21/2016. Her results were negative for mutations in all 46 genes analyzed by [facility]'s 46-gene Common Hereditary Cancers Panel. Genes analyzed include: APC, ATM, AXIN2, BARD1, BMPR1A, BRCA1, BRCA2, BRIP1, CDH1, CDKN2A, CHEK2, CTNNA1, DICER1, EPCAM, GREM1, HOXB13, KIT, MEN1, MLH1, MSH2, MSH3, MSH6, MUTYH, NBN,   . Headache(784.0)    migraines  . History of blood transfusion 06/2005  . History of gallstones   . History of pneumonia   . History of shingles 07/2011  . HTN (hypertension)   . Normal coronary arteries 2003  . PFO (patent foramen ovale)   . Presence of inferior vena cava filter   . Pulmonary embolism ([place])   . Sleep apnea    used CPAP until after bypass surg.  . Status post gastric bypass for obesity   . Stroke [facility]) 12/2002   right-sided weakness  . Urinary incontinence    Past Surgical History:  Procedure Laterality Date  . ABDOMINAL HYSTERECTOMY  11/1997   complete  . ANTERIOR CERVICAL DECOMP/DISCECTOMY FUSION  02/05/2005   C5-6  . APPENDECTOMY  10/22/2008   laparoscopic  . BREAST LUMPECTOMY WITH RADIOACTIVE SEED AND SENTINEL LYMPH NODE BIOPSY Left 07/26/2016   Procedure: BREAST LUMPECTOMY WITH RADIOACTIVE SEED AND SENTINEL LYMPH NODE BIOPSY;  Surgeon: Erroll Luna, MD;  Location: [place];  Service: General;  Laterality: Left;  . BUNIONECTOMY  05/1980   both feet  . BUNIONECTOMY  08/2011   left foot  . CARDIAC CATHETERIZATION  03/04/2002  . CARPAL TUNNEL RELEASE  06/21/2009   right  . CARPAL TUNNEL RELEASE     left hand  . CARPAL TUNNEL RELEASE  10/03/2011   Procedure: CARPAL TUNNEL RELEASE;  Surgeon: Wynonia Sours, MD;  Location: [place];  Service: Orthopedics;  Laterality: Right;  CARPAL TUNNEL WITH HYPOTHENAR FAT PAD TRANSFER  . CERVICAL SPINE SURGERY  01/2005   titanium plate implanted  . CHOLECYSTECTOMY  1990  . ELBOW SURGERY  08/09/2004   decompression ulnar nerve right elbow  . ENTEROLYSIS  10/22/2008   laparoscopic abd. enterolysis  . GASTRIC ROUX-EN-Y  2009  . HEEL SPUR SURGERY  08/1997   left  . HEMORRHOID SURGERY  03/1993  . LAPAROSCOPIC LYSIS INTESTINAL ADHESIONS  02/14/2000  . NAILBED REPAIR  01/10/2005; 08/2011   exc. matrix bilat. great toe  . OTHER SURGICAL HISTORY  12/1986   pt states that she had surgery to unclog her fallopean tubes  . PORTACATH PLACEMENT N/A 09/24/2016   Procedure: INSERTION PORT-A-CATH WITH [name];  Surgeon: Erroll Luna, MD;  Location: [place];  Service: General;  Laterality: N/A;  . SHOULDER SURGERY     bilat. - (left:  06/2005)  . TONSILLECTOMY  07/1995  .  TRIGGER FINGER RELEASE  04/25/2006   decompression A-1 pulley left thumb  . UTERINE FIBROID SURGERY  12/95, 7/96   x2  . VENA CAVA FILTER PLACEMENT  2009   during Roux-en-Y surg.     ALLERGIES:  Allergies  Allergen Reactions  . Aspirin Other (See Comments)    ESOPHAGITIS  . Oxycodone Hcl Diarrhea and Nausea And Vomiting  . Propoxyphene N-Acetaminophen Diarrhea and Nausea And Vomiting  . Tramadol Other (See Comments)    Cause migraines  . Adhesive [Tape] Rash and Other (See Comments)    Pulls skin off - please use paper tape  . Prednisone Rash     CURRENT MEDICATIONS:  Outpatient Encounter Medications as of 11/26/2017  Medication Sig Note  . acetaminophen (TYLENOL) 500 MG tablet Take 1,000 mg by mouth every 6 (six) hours as needed (pain).   Marland Kitchen acetaminophen-codeine (TYLENOL #3) 300-30 MG tablet Take 1-2 tablets by mouth every 6 (six) hours as needed for moderate pain.   Marland Kitchen albuterol (PROAIR HFA) 108 (90 Base) MCG/ACT inhaler Inhale 2 puffs into the lungs every 6 (six) hours as needed for wheezing or shortness of  breath.   Marland Kitchen aspirin EC 81 MG tablet Take 1 tablet (81 mg total) by mouth daily.   . Calcium Citrate-Vitamin D (CALCIUM CITRATE +D PO) Take 2 tablets by mouth daily. Calcium 600 mg    . letrozole (FEMARA) 2.5 MG tablet Take 1 tablet (2.5 mg total) by mouth daily.   Marland Kitchen lisinopril (PRINIVIL,ZESTRIL) 20 MG tablet Take 20 mg by mouth daily.   . mometasone (ELOCON) 0.1 % cream Apply 1 application topically daily as needed (rash - summer eczema). Apply to arms   . Multiple Vitamin (MULTIVITAMIN WITH MINERALS) TABS tablet Take 1 tablet by mouth at bedtime. 09/30/2015: Without iron/ also takes multivitamin with iron every morning  . mupirocin cream (BACTROBAN) 2 % Apply 1 application topically 2 (two) times daily.   . orphenadrine (NORFLEX) 100 MG tablet Take 100 mg by mouth 2 (two) times daily.   Marland Kitchen oxybutynin (DITROPAN) 5 MG tablet TAKE 1 TABLET (5 MG TOTAL) BY MOUTH 3 (THREE) TIMES DAILY.   . ranitidine (ZANTAC) 300 MG capsule Take 1 capsule (300 mg total) by mouth 2 (two) times daily.   . silver sulfADIAZINE (SILVADENE) 1 % cream Apply 1 application topically 2 (two) times daily as needed (stomach tears).   . Turmeric 500 MG CAPS Take by mouth.   . valACYclovir (VALTREX) 1000 MG tablet Take 1 tablet (1,000 mg total) by mouth 3 (three) times daily.   . [DISCONTINUED] valACYclovir (VALTREX) 500 MG tablet Take 1 tablet by mouth as needed.    No facility-administered encounter medications on file as of 11/26/2017.      ONCOLOGIC FAMILY HISTORY:  Family History  Problem Relation Age of Onset  . Breast cancer Paternal Aunt 47  . Cervical cancer Paternal Grandmother 59       d.40s  . Ovarian cancer Maternal Grandmother 23       d.23  . Colon polyps Father   . Diabetes Father        borderline  . Prostate cancer Father   . Hypertension Mother   . Hypertension Unknown   . Breast cancer Sister 75       treated with neoadjuvant chemo/radiation and lumpectomy     GENETIC  COUNSELING/TESTING: negative  SOCIAL HISTORY:  Social History   Socioeconomic History  . Marital status: Single    Spouse name: Not on  file  . Number of children: 0  . Years of education: Not on file  . Highest education level: Not on file  Occupational History  . Occupation: disabled  Social Needs  . Financial resource strain: Not on file  . Food insecurity:    Worry: Not on file    Inability: Not on file  . Transportation needs:    Medical: Not on file    Non-medical: Not on file  Tobacco Use  . Smoking status: Former Research scientist (life sciences)  . Smokeless tobacco: Never Used  . Tobacco comment: quit smoking 08/1989  Substance and Sexual Activity  . Alcohol use: No  . Drug use: No  . Sexual activity: Never    Birth control/protection: Surgical  Lifestyle  . Physical activity:    Days per week: Not on file    Minutes per session: Not on file  . Stress: Not on file  Relationships  . Social connections:    Talks on phone: Not on file    Gets together: Not on file    Attends religious service: Not on file    Active member of club or organization: Not on file    Attends meetings of clubs or organizations: Not on file    Relationship status: Not on file  . Intimate partner violence:    Fear of current or ex partner: Not on file    Emotionally abused: Not on file    Physically abused: Not on file    Forced sexual activity: Not on file  Other Topics Concern  . Not on file  Social History Narrative  . Not on file     PHYSICAL EXAMINATION:  Vital Signs:   Vitals:   11/26/17 1440  BP: 128/65  Pulse: 78  Resp: 18  Temp: 98.7 F (37.1 C)  SpO2: 100%   Filed Weights   11/26/17 1440  Weight: 207 lb 11.2 oz (94.2 kg)   General: Well-nourished, well-appearing female in no acute distress.  She is accompanied in clinic by her friend today.   HEENT: Head is normocephalic.  Pupils equal and reactive to light. Conjunctivae clear without exudate.  Sclerae anicteric. Oral mucosa is  pink, moist.  Oropharynx is pink without lesions or erythema.  Lymph: No cervical, supraclavicular, or infraclavicular lymphadenopathy noted on palpation.  Cardiovascular: Regular rate and rhythm.Marland Kitchen Respiratory: Clear to auscultation bilaterally. Chest expansion symmetric; breathing non-labored.  Breast: left breast s/p lumpectomy and radiation, mild amt of scar tissue present, no nodules, masses, noted, right breast without nodules, masses, skin or nipple changes.   GI: Abdomen soft and round; non-tender, non-distended. Bowel sounds normoactive.  GU: Deferred.  Neuro: No focal deficits. Steady gait.  Psych: Mood and affect normal and appropriate for situation.  Extremities: No edema. MSK: No focal spinal tenderness to palpation.  Full range of motion in bilateral upper extremities Skin: Warm and dry.  LABORATORY DATA:  None for this visit.  DIAGNOSTIC IMAGING:  None for this visit.      ASSESSMENT AND PLAN:  [name] is a pleasant 57 y.o. female with Stage IA left breast invasive ductal carcinoma, ER+/PR+/HER2-, oncotype 26, diagnosed in 06/2016, treated with lumpectomy, adjuvant chemotherapy, adjuvant radiation therapy, and anti-estrogen therapy with Letrozole beginning in 08/2017.  She presents to the Survivorship Clinic for our initial meeting and routine follow-up post-completion of treatment for breast cancer.    1. Stage IA left breast cancer:  [name] is continuing to recover from definitive treatment for breast cancer. She will  follow-up with her medical oncologist, Dr. Lindi Adie in 6 months with history and physical exam per surveillance protocol.  She will continue her anti-estrogen therapy with Letrozole. Thus far, she is tolerating the Letrozole well, with minimal side effects. Today, a comprehensive survivorship care plan and treatment summary was reviewed with the patient today detailing her breast cancer diagnosis, treatment course, potential late/long-term effects of  treatment, appropriate follow-up care with recommendations for the future, and patient education resources.  A copy of this summary, along with a letter will be sent to the patient's primary care provider via mail/fax/In Basket message after today's visit.    2. Peripheral neuropathy.  This is stable. I offered to refer her to Physical therapy, however she declined.  She will let me know if she changes her mind.    3. Bone health:  Given [name]'s age/history of breast cancer and her current treatment regimen including anti-estrogen therapy with Letrozole, she is at risk for bone demineralization.  She hasn't yet undergone bone density testing, and I ordered this today to be done when she has her mammogram. I counseled her that we will need to follow this test every 2 years while she is taking an aromatase inhibitor.  In the meantime, she was encouraged to increase her consumption of foods rich in calcium, as well as increase her weight-bearing activities.  She was given education on specific activities to promote bone health.  4. Cancer screening:  Due to [name]'s history and her age, she should receive screening for skin cancers, colon cancer, and gynecologic cancers.  The information and recommendations are listed on the patient's comprehensive care plan/treatment summary and were reviewed in detail with the patient.    5. Health maintenance and wellness promotion: [name] was encouraged to consume 5-7 servings of fruits and vegetables per day. We reviewed the "Nutrition Rainbow" handout, as well as the handout "Take Control of Your Health and Reduce Your Cancer Risk" from the [employer].  She was also encouraged to engage in moderate to vigorous exercise for 30 minutes per day most days of the week. We discussed the [employer] fitness program, which is designed for cancer survivors to help them become more physically fit after cancer treatments.  She was instructed to limit her  alcohol consumption and continue to abstain from tobacco use.     6. Support services/counseling: It is not uncommon for this period of the patient's cancer care trajectory to be one of many emotions and stressors.  We discussed an opportunity for her to participate in the next session of [facility] ("Finding Your New Normal") support group series designed for patients after they have completed treatment.   [name] was encouraged to take advantage of our many other support services programs, support groups, and/or counseling in coping with her new life as a cancer survivor after completing anti-cancer treatment.  She was given information regarding our available services and encouraged to contact me with any questions or for help enrolling in any of our support group/programs.    Dispo:   -Return to cancer center in 6 months for f/u with Dr. Lindi Adie  -Mammogram due in 01/2018 -Bone density testing due in 01/2018 -Follow up with surgery per Dr. Brantley Stage -She is welcome to return back to the Survivorship Clinic at any time; no additional follow-up needed at this time.  -Consider referral back to survivorship as a long-term survivor for continued surveillance  A total of (30) minutes of face-to-face time  was spent with this patient with greater than 50% of that time in counseling and care-coordination.   Gardenia Phlegm, [place] [phone]   Note: PRIMARY CARE PROVIDER [name], [place] [phone]

## 2017-12-09 MED FILL — LETROZOLE 2.5 MG TABLET: 2.5 | 90 days supply | Qty: 90 | Fill #1

## 2017-12-09 MED FILL — ORPHENADRINE 100 MG TAB SA: 100 | 30 days supply | Qty: 60 | Fill #0

## 2017-12-16 ENCOUNTER — Other Ambulatory Visit: Payer: Self-pay | Admitting: Adult Health

## 2017-12-16 DIAGNOSIS — R350 Frequency of micturition: Secondary | ICD-10-CM

## 2017-12-16 MED FILL — LISINOPRIL 20 MG TABLET: 20 | 90 days supply | Qty: 90 | Fill #1

## 2017-12-16 MED FILL — OXYBUTYNIN CHLORIDE 5 MG TA: 5 | 90 days supply | Qty: 270 | Fill #0

## 2018-01-01 DIAGNOSIS — M47816 Spondylosis without myelopathy or radiculopathy, lumbar region: Secondary | ICD-10-CM | POA: Diagnosis not present

## 2018-01-01 DIAGNOSIS — M5136 Other intervertebral disc degeneration, lumbar region: Secondary | ICD-10-CM | POA: Diagnosis not present

## 2018-01-01 DIAGNOSIS — M545 Low back pain: Secondary | ICD-10-CM | POA: Diagnosis not present

## 2018-01-01 DIAGNOSIS — Z6841 Body Mass Index (BMI) 40.0 and over, adult: Secondary | ICD-10-CM | POA: Diagnosis not present

## 2018-01-13 ENCOUNTER — Other Ambulatory Visit: Payer: Medicare Other

## 2018-01-14 MED FILL — CLINDAMYCIN HCL 150 MG CAPS: 150 | 10 days supply | Qty: 40 | Fill #0

## 2018-01-14 MED FILL — HYDROCODON-APAP 7.5-325: 7.5-325 | 2 days supply | Qty: 12 | Fill #0

## 2018-02-04 ENCOUNTER — Encounter (INDEPENDENT_AMBULATORY_CARE_PROVIDER_SITE_OTHER): Payer: Self-pay

## 2018-02-07 DIAGNOSIS — M1712 Unilateral primary osteoarthritis, left knee: Secondary | ICD-10-CM | POA: Diagnosis not present

## 2018-02-07 DIAGNOSIS — M1711 Unilateral primary osteoarthritis, right knee: Secondary | ICD-10-CM | POA: Diagnosis not present

## 2018-02-07 DIAGNOSIS — M25562 Pain in left knee: Secondary | ICD-10-CM | POA: Diagnosis not present

## 2018-02-07 DIAGNOSIS — M25561 Pain in right knee: Secondary | ICD-10-CM | POA: Diagnosis not present

## 2018-02-14 DIAGNOSIS — Z23 Encounter for immunization: Secondary | ICD-10-CM | POA: Diagnosis not present

## 2018-02-18 ENCOUNTER — Encounter (INDEPENDENT_AMBULATORY_CARE_PROVIDER_SITE_OTHER): Payer: Self-pay | Admitting: Bariatrics

## 2018-02-18 ENCOUNTER — Ambulatory Visit (INDEPENDENT_AMBULATORY_CARE_PROVIDER_SITE_OTHER): Payer: Medicare Other | Admitting: Bariatrics

## 2018-02-18 VITALS — BP 108/73 | HR 47 | Temp 97.9°F | Ht 62.0 in | Wt 204.0 lb

## 2018-02-18 DIAGNOSIS — Z0289 Encounter for other administrative examinations: Secondary | ICD-10-CM

## 2018-02-18 DIAGNOSIS — I10 Essential (primary) hypertension: Secondary | ICD-10-CM

## 2018-02-18 DIAGNOSIS — E559 Vitamin D deficiency, unspecified: Secondary | ICD-10-CM

## 2018-02-18 DIAGNOSIS — R0602 Shortness of breath: Secondary | ICD-10-CM | POA: Diagnosis not present

## 2018-02-18 DIAGNOSIS — Z6836 Body mass index (BMI) 36.0-36.9, adult: Secondary | ICD-10-CM

## 2018-02-18 DIAGNOSIS — M2550 Pain in unspecified joint: Secondary | ICD-10-CM

## 2018-02-18 DIAGNOSIS — I6389 Other cerebral infarction: Secondary | ICD-10-CM

## 2018-02-18 DIAGNOSIS — Z6837 Body mass index (BMI) 37.0-37.9, adult: Secondary | ICD-10-CM

## 2018-02-18 DIAGNOSIS — R5383 Other fatigue: Secondary | ICD-10-CM

## 2018-02-18 DIAGNOSIS — Z1331 Encounter for screening for depression: Secondary | ICD-10-CM | POA: Diagnosis not present

## 2018-02-18 NOTE — Progress Notes (Signed)
.  Office: [phone]  /  Fax: [phone]   HPI:   Chief Complaint: OBESITY  [name] (MR# [id number]) is a 57 y.o. female who presents on 02/18/2018 for obesity evaluation and treatment. Current BMI is Body mass index is 37.31 kg/m.[name] has struggled with obesity for years and has been unsuccessful in either losing weight or maintaining long term weight loss. [name] attended our information session and states she is currently in the action stage of change and ready to dedicate time achieving and maintaining a healthier weight.  [name] states her family eats meals together she thinks her family will eat healthier with  her her desired weight loss is 76.4 she has been heavy most of  her life she started gaining weight in her teens her heaviest weight ever was 304 lbs. she has significant food cravings issues  she snacks frequently in the evenings she skips meals frequently she is frequently drinking liquids with calories she struggles with emotional eating   History of Roux-En-Y gastric bypass in 2009 at [facility]. Her highest weight was at 304 and her lowest weight was at 178. [name], [facility]).   Fatigue [name] feels her energy is lower than it should be. This has worsened with weight gain and has not worsened recently. [name] admits to daytime somnolence and admits to waking up still tired. Patient is at risk for obstructive sleep apnea. Patent has a history of symptoms of daytime fatigue, morning fatigue, morning headache and hypertension. Patient generally gets 7 hours of sleep per night, and states they generally have restful sleep. Snoring is present. Apneic episodes are present. Epworth Sleepiness Score is 9  Dyspnea on exertion [name] notes increasing shortness of breath with exercising and seems to be worsening over time with weight gain. She notes getting out of breath sooner with activity than she used to. This has not gotten worse  recently. [name] has a history of asthma and she uses an inhaler two times a month. [name] denies orthopnea.  Hypertension [name] is a 57 y.o. female with hypertension and she is taking Lisinopril with no side effects. [name] denies hypotension. She is working weight loss to help control her blood pressure with the goal of decreasing her risk of heart attack and stroke. [name] blood pressure is currently controlled.  Arthralgia with Back Pain [name] has fibromyalgia with generalized pain and intermittent pain with her back and knees. She is currently taking Tylenol extra strength, but no other medications.  Vitamin D deficiency [name] has a diagnosis of vitamin D deficiency. She is currently taking OTC calcium with vit D and denies nausea, vomiting or muscle weakness.  History of Cerebral Vascular Accident 2004 (history of PFO) [name] sees her cardiologist every six months. She is not on medication currently. [name] took Coumadin for five years.  Depression Screen [name]'s Food and Mood (modified PHQ-9) score was  Depression screen PHQ 2/9 02/18/2018  Decreased Interest 1  Down, Depressed, Hopeless 0  PHQ - 2 Score 1  Altered sleeping 1  Tired, decreased energy 3  Change in appetite 1  Feeling bad or failure about yourself  1  Trouble concentrating 0  Moving slowly or fidgety/restless 1  Suicidal thoughts 0  PHQ-9 Score 8  Some recent data might be hidden    ALLERGIES: Allergies  Allergen Reactions  . Aspirin Other (See Comments)    ESOPHAGITIS  . Oxycodone Hcl Diarrhea and Nausea And Vomiting  . Propoxyphene N-Acetaminophen  Diarrhea and Nausea And Vomiting  . Tramadol Other (See Comments)    Cause migraines  . Adhesive [Tape] Rash and Other (See Comments)    Pulls skin off - please use paper tape  . Prednisone Rash    MEDICATIONS: Current Outpatient Medications on File Prior to Visit  Medication Sig Dispense Refill  .  diphenhydrAMINE-APAP, sleep, (TYLENOL PM EXTRA STRENGTH) 50-1000 MG/30ML LIQD Take by mouth.    Marland Kitchen albuterol (PROAIR HFA) 108 (90 Base) MCG/ACT inhaler Inhale 2 puffs into the lungs every 6 (six) hours as needed for wheezing or shortness of breath.    . Calcium Citrate-Vitamin D (CALCIUM CITRATE +D PO) Take 2 tablets by mouth daily. Calcium 600 mg     . letrozole (FEMARA) 2.5 MG tablet Take 1 tablet (2.5 mg total) by mouth daily. 90 tablet 3  . lisinopril (PRINIVIL,ZESTRIL) 20 MG tablet Take 20 mg by mouth daily.    . mometasone (ELOCON) 0.1 % cream Apply 1 application topically daily as needed (rash - summer eczema). Apply to arms    . Multiple Vitamin (MULTIVITAMIN WITH MINERALS) TABS tablet Take 1 tablet by mouth at bedtime.    . orphenadrine (NORFLEX) 100 MG tablet Take 100 mg by mouth 2 (two) times daily.    Marland Kitchen oxybutynin (DITROPAN) 5 MG tablet TAKE 1 TABLET BY MOUTH 3 TIMES DAILY 270 tablet 0  . ranitidine (ZANTAC) 300 MG capsule Take 1 capsule (300 mg total) by mouth 2 (two) times daily. 60 capsule 3  . silver sulfADIAZINE (SILVADENE) 1 % cream Apply 1 application topically 2 (two) times daily as needed (stomach tears). 50 g 0  . valACYclovir (VALTREX) 1000 MG tablet Take 1 tablet (1,000 mg total) by mouth 3 (three) times daily. 21 tablet 0   No current facility-administered medications on file prior to visit.     PAST MEDICAL HISTORY: Past Medical History:  Diagnosis Date  . Anemia    since bypass  . Arthritis    osteoarthritis  . Asthma    states no asthma attack since 2002  . Back pain   . Breast cancer ([place]) 06/26/16 bx   left breast  . Breast cancer ([place])   . Chronic back pain   . Complication of anesthesia    states takes more than normal to put her to sleep  . Constipation   . Dental bridge present    upper  . Dental crowns present   . DVT of upper extremity (deep vein thrombosis) ([place])   . Fibromyalgia   . Genetic testing 09/19/2016   Ms. Murnane underwent genetic  counseling and testing for hereditary cancer syndromes on 08/21/2016. Her results were negative for mutations in all 46 genes analyzed by [facility]'s 46-gene Common Hereditary Cancers Panel. Genes analyzed include: APC, ATM, AXIN2, BARD1, BMPR1A, BRCA1, BRCA2, BRIP1, CDH1, CDKN2A, CHEK2, CTNNA1, DICER1, EPCAM, GREM1, HOXB13, KIT, MEN1, MLH1, MSH2, MSH3, MSH6, MUTYH, NBN,   . Headache(784.0)    migraines  . History of blood transfusion 06/2005  . History of gallstones   . History of pneumonia   . History of shingles 07/2011  . History of shingles   . HTN (hypertension)   . Itching   . Joint pain   . Muscle weakness   . Neuropathy   . Normal coronary arteries 2003  . Osteoarthropathy   . PFO (patent foramen ovale)   . Presence of inferior vena cava filter   . Pulmonary embolism ([place])   . Sleep apnea  used CPAP until after bypass surg.  . Status post gastric bypass for obesity   . Stomach pain   . Stroke [facility]) 12/2002   right-sided weakness  . Urinary incontinence     PAST SURGICAL HISTORY: Past Surgical History:  Procedure Laterality Date  . ABDOMINAL HYSTERECTOMY  11/1997   complete  . ANTERIOR CERVICAL DECOMP/DISCECTOMY FUSION  02/05/2005   C5-6  . APPENDECTOMY  10/22/2008   laparoscopic  . BREAST LUMPECTOMY WITH RADIOACTIVE SEED AND SENTINEL LYMPH NODE BIOPSY Left 07/26/2016   Procedure: BREAST LUMPECTOMY WITH RADIOACTIVE SEED AND SENTINEL LYMPH NODE BIOPSY;  Surgeon: Erroll Luna, MD;  Location: [place];  Service: General;  Laterality: Left;  . BUNIONECTOMY  05/1980   both feet  . BUNIONECTOMY  08/2011   left foot  . CARDIAC CATHETERIZATION  03/04/2002  . CARPAL TUNNEL RELEASE  06/21/2009   right  . CARPAL TUNNEL RELEASE     left hand  . CARPAL TUNNEL RELEASE  10/03/2011   Procedure: CARPAL TUNNEL RELEASE;  Surgeon: Wynonia Sours, MD;  Location: [place];  Service: Orthopedics;  Laterality: Right;  CARPAL TUNNEL WITH HYPOTHENAR FAT PAD TRANSFER    . CERVICAL SPINE SURGERY  01/2005   titanium plate implanted  . CHOLECYSTECTOMY  1990  . ELBOW SURGERY  08/09/2004   decompression ulnar nerve right elbow  . ENTEROLYSIS  10/22/2008   laparoscopic abd. enterolysis  . GASTRIC ROUX-EN-Y  2009  . HEEL SPUR SURGERY  08/1997   left  . HEMORRHOID SURGERY  03/1993  . LAPAROSCOPIC LYSIS INTESTINAL ADHESIONS  02/14/2000  . NAILBED REPAIR  01/10/2005; 08/2011   exc. matrix bilat. great toe  . OTHER SURGICAL HISTORY  12/1986   pt states that she had surgery to unclog her fallopean tubes  . PORTACATH PLACEMENT N/A 09/24/2016   Procedure: INSERTION PORT-A-CATH WITH [name];  Surgeon: Erroll Luna, MD;  Location: [place];  Service: General;  Laterality: N/A;  . SHOULDER SURGERY     bilat. - (left:  06/2005)  . TONSILLECTOMY  07/1995  . TRIGGER FINGER RELEASE  04/25/2006   decompression A-1 pulley left thumb  . UTERINE FIBROID SURGERY  12/95, 7/96   x2  . VENA CAVA FILTER PLACEMENT  2009   during Roux-en-Y surg.    SOCIAL HISTORY: Social History   Tobacco Use  . Smoking status: Former Research scientist (life sciences)  . Smokeless tobacco: Never Used  . Tobacco comment: quit smoking 08/1989  Substance Use Topics  . Alcohol use: No  . Drug use: No    FAMILY HISTORY: Family History  Problem Relation Age of Onset  . Breast cancer Paternal Aunt 69  . Cervical cancer Paternal Grandmother 49       d.40s  . Ovarian cancer Maternal Grandmother 23       d.23  . Colon polyps Father   . Diabetes Father        borderline  . Prostate cancer Father   . High blood pressure Father   . Hypertension Mother   . Hypertension Unknown   . Breast cancer Sister 27       treated with neoadjuvant chemo/radiation and lumpectomy    ROS: Review of Systems  Constitutional: Positive for malaise/fatigue.       + Breasts Pain  HENT:       + Dry Mouth  Eyes: Positive for pain.       + Wear Glasses or Contacts + Blurry or Double Vision   Respiratory:  Positive for shortness of breath (on  exertion).   Cardiovascular: Negative for orthopnea.       + Chest Pain/Discomfort + Very Cold Feet or Hands   Gastrointestinal: Positive for heartburn.  Genitourinary: Positive for frequency.  Musculoskeletal: Positive for back pain, joint pain, myalgias and neck pain.       + Knee Pain bilaterally + Hip Pain bilaterally  Skin: Positive for itching.  Neurological: Positive for weakness and headaches.  Endo/Heme/Allergies: Bruises/bleeds easily (bruising).       + Cold Intolerance    PHYSICAL EXAM: Blood pressure 108/73, pulse (!) 47, temperature 97.9 F (36.6 C), temperature source Oral, height '5\' 2"'$  (1.575 m), weight 204 lb (92.5 kg), SpO2 99 %. Body mass index is 37.31 kg/m. Physical Exam  Constitutional: She is oriented to person, place, and time. She appears well-developed and well-nourished.  HENT:  Head: Normocephalic and atraumatic.  Nose: Nose normal.  Mallanpati = 3  Eyes: EOM are normal. No scleral icterus.  Neck: Normal range of motion. Neck supple. No thyromegaly present.  Cardiovascular: Regular rhythm. Bradycardia present.  Pulmonary/Chest: Effort normal. No respiratory distress.  Abdominal: Soft. There is no tenderness.  Musculoskeletal: Normal range of motion.  Range of Motion normal in all 4 extremities  Neurological: She is alert and oriented to person, place, and time. Coordination abnormal.  + Uses a cane for ambulation  Skin: Skin is warm and dry.  Psychiatric: She has a normal mood and affect. Her behavior is normal.  Vitals reviewed.   RECENT LABS AND TESTS: BMET    Component Value Date/Time   NA 138 11/26/2017 1544   NA 138 04/19/2017 1005   K 4.2 11/26/2017 1544   K 4.0 04/19/2017 1005   CL 103 11/26/2017 1544   CO2 27 11/26/2017 1544   CO2 25 04/19/2017 1005   GLUCOSE 80 11/26/2017 1544   GLUCOSE 87 04/19/2017 1005   BUN 13 11/26/2017 1544   BUN 12.0 04/19/2017 1005   CREATININE 0.77 11/26/2017 1544   CREATININE 0.7 04/19/2017 1005     CALCIUM 9.3 11/26/2017 1544   CALCIUM 8.9 04/19/2017 1005   GFRNONAA >60 11/26/2017 1544   GFRAA >60 11/26/2017 1544   No results found for: HGBA1C No results found for: INSULIN CBC    Component Value Date/Time   WBC 4.3 11/26/2017 1544   WBC 3.5 (L) 04/19/2017 1005   WBC 7.5 12/14/2016 0120   RBC 3.62 (L) 11/26/2017 1544   HGB 11.5 (L) 11/26/2017 1544   HGB 10.2 (L) 04/19/2017 1005   HCT 34.8 11/26/2017 1544   HCT 31.1 (L) 04/19/2017 1005   PLT 192 11/26/2017 1544   PLT 184 04/19/2017 1005   MCV 96.1 11/26/2017 1544   MCV 97.5 04/19/2017 1005   MCH 31.8 11/26/2017 1544   MCHC 33.0 11/26/2017 1544   RDW 14.5 11/26/2017 1544   RDW 15.3 (H) 04/19/2017 1005   LYMPHSABS 1.3 11/26/2017 1544   LYMPHSABS 1.0 04/19/2017 1005   MONOABS 0.4 11/26/2017 1544   MONOABS 0.3 04/19/2017 1005   EOSABS 0.1 11/26/2017 1544   EOSABS 0.1 04/19/2017 1005   BASOSABS 0.0 11/26/2017 1544   BASOSABS 0.0 04/19/2017 1005   Iron/TIBC/Ferritin/ %Sat No results found for: IRON, TIBC, FERRITIN, IRONPCTSAT Lipid Panel     Component Value Date/Time   CHOL 199 09/30/2015 1838   TRIG 58 09/30/2015 1838   HDL 77 09/30/2015 1838   CHOLHDL 2.6 09/30/2015 1838   VLDL 12 09/30/2015 1838   LDLCALC  110 (H) 09/30/2015 1838   Hepatic Function Panel     Component Value Date/Time   PROT 7.3 11/26/2017 1544   PROT 6.7 04/19/2017 1005   ALBUMIN 4.3 11/26/2017 1544   ALBUMIN 3.9 04/19/2017 1005   AST 17 11/26/2017 1544   AST 14 04/19/2017 1005   ALT 14 11/26/2017 1544   ALT 11 04/19/2017 1005   ALKPHOS 86 11/26/2017 1544   ALKPHOS 50 04/19/2017 1005   BILITOT 0.4 11/26/2017 1544   BILITOT 0.31 04/19/2017 1005   No results found for: TSH Vitamin D There are no recent lab results  ECG  shows NSR with a rate of 50 BPM INDIRECT CALORIMETER done today shows a VO2 of 212 and a REE of 1475. Her calculated basal metabolic rate is 3300 thus her basal metabolic rate is better than  expected.    ASSESSMENT AND PLAN: Other fatigue - Plan: EKG 12-Lead, Hemoglobin A1c, Insulin, random, Lipid Panel With LDL/HDL Ratio, Vitamin B12, Folate, T3, T4, free, TSH  Shortness of breath on exertion  Essential hypertension - Plan: Comprehensive metabolic panel  Arthralgia, unspecified joint  Vitamin D deficiency - Plan: VITAMIN D 25 Hydroxy (Vit-D Deficiency, Fractures)  Depression screening  Class 2 severe obesity with serious comorbidity and body mass index (BMI) of 37.0 to 37.9 in adult, unspecified obesity type (HCC)  PLAN:  Fatigue Nicoya was informed that her fatigue may be related to obesity, depression or many other causes. Labs will be ordered, and in the meanwhile De has agreed to work on diet, exercise and weight loss to help with fatigue. Proper sleep hygiene was discussed including the need for 7-8 hours of quality sleep each night. A sleep study was not ordered based on symptoms and Epworth score.  Dyspnea on exertion Adrine's shortness of breath appears to be obesity related and exercise induced. She has agreed to work on weight loss and continue her current exercise regimen to treat her exercise induced shortness of breath. If Zannie follows our instructions and loses weight without improvement of her shortness of breath, we will plan to refer to pulmonology. We will monitor this condition regularly. Ardel agrees to this plan.  Hypertension We discussed sodium restriction, working on healthy weight loss, and a regular exercise program as the means to achieve improved blood pressure control. Kelita agreed with this plan and agreed to follow up as directed. We will continue to monitor her blood pressure as well as her progress with the above lifestyle modifications. She will continue her medications as prescribed and will watch for signs of hypotension as she continues her lifestyle modifications.  Arthralgia with Back Pain Vincentina will continue  to take extra strength Tylenol and she was advised that weight loss may help this.  Vitamin D Deficiency Dorraine was informed that low vitamin D levels contributes to fatigue and are associated with obesity, breast, and colon cancer. She agrees to continue to take OTC calcium with vitamin D and will follow up for routine testing of vitamin D, at least 2-3 times per year. She was informed of the risk of over-replacement of vitamin D and agrees to not increase her dose unless she discusses this with [name] first.  History of Cerebral Vascular Accident 2004 (history of PFO) Easter will continue to see her cardiologist every six months and she will slowly increase her activity. Icis will follow up with our clinic in 2 weeks.  Depression Screen Keilana had a mildly positive depression screening. Depression is commonly associated with obesity  and often results in emotional eating behaviors. We will monitor this closely and work on CBT to help improve the non-hunger eating patterns. Referral to Psychology may be required if no improvement is seen as she continues in our clinic.  Obesity Kennley is currently in the action stage of change and her goal is to continue with weight loss efforts She has agreed to follow the Category 2 plan Liala has been instructed to work up to a goal of 150 minutes of combined cardio and strengthening exercise per week for weight loss and overall health benefits. We discussed the following Behavioral Modification Strategies today: no skipping meals, keeping healthy foods in the home, better snacking choices, increasing lean protein intake, decreasing simple carbohydrates , increasing vegetables, decreasing sodium intake and work on meal planning and easy cooking plans  Joaquina will continue to read nutrition labels.  Whitley has agreed to follow up with our clinic in 2 weeks. She was informed of the importance of frequent follow up visits to maximize her success with  intensive lifestyle modifications for her multiple health conditions. She was informed we would discuss her lab results at her next visit unless there is a critical issue that needs to be addressed sooner. Zeeva agreed to keep her next visit at the agreed upon time to discuss these results.    OBESITY BEHAVIORAL INTERVENTION VISIT  Today's visit was # 1   Starting weight: 204 lbs Starting date: 02/18/18 Today's weight : 204 lbs  Today's date: 02/18/2018 Total lbs lost to date: 0 At least 15 minutes were spent on discussing the following behavioral intervention visit.   ASK: We discussed the diagnosis of obesity with [name] today and Daijah agreed to give [name] permission to discuss obesity behavioral modification therapy today.  ASSESS: Maranda has the diagnosis of obesity and her BMI today is 21.3 Gustavia is in the action stage of change   ADVISE: Doloris was educated on the multiple health risks of obesity as well as the benefit of weight loss to improve her health. She was advised of the need for long term treatment and the importance of lifestyle modifications to improve her current health and to decrease her risk of future health problems.  AGREE: Multiple dietary modification options and treatment options were discussed and  Alora agreed to follow the recommendations documented in the above note.  ARRANGE: Kasmira was educated on the importance of frequent visits to treat obesity as outlined per CMS and USPSTF guidelines and agreed to schedule her next follow up appointment today.   Corey Skains, am acting as Location manager for [employer]. Owens Shark, DO  I have reviewed the above documentation for accuracy and completeness, and I agree with the above. -Jearld Lesch, DO

## 2018-02-19 LAB — COMPREHENSIVE METABOLIC PANEL
ALT: 10 IU/L (ref 0–32)
AST: 18 IU/L (ref 0–40)
Albumin/Globulin Ratio: 1.9 (ref 1.2–2.2)
Albumin: 4.5 g/dL (ref 3.5–5.5)
Alkaline Phosphatase: 81 IU/L (ref 39–117)
BUN/Creatinine Ratio: 18 (ref 9–23)
BUN: 12 mg/dL (ref 6–24)
Bilirubin Total: 0.5 mg/dL (ref 0.0–1.2)
CO2: 23 mmol/L (ref 20–29)
Calcium: 9.6 mg/dL (ref 8.7–10.2)
Chloride: 98 mmol/L (ref 96–106)
Creatinine, Ser: 0.68 mg/dL (ref 0.57–1.00)
GFR calc Af Amer: 113 mL/min/{1.73_m2} (ref >59)
GFR calc non Af Amer: 98 mL/min/{1.73_m2} (ref >59)
Globulin, Total: 2.4 g/dL (ref 1.5–4.5)
Glucose: 98 mg/dL (ref 65–99)
Potassium: 4.5 mmol/L (ref 3.5–5.2)
Sodium: 136 mmol/L (ref 134–144)
Total Protein: 6.9 g/dL (ref 6.0–8.5)

## 2018-02-19 LAB — LIPID PANEL WITH LDL/HDL RATIO
Cholesterol, Total: 236 mg/dL — ABNORMAL HIGH (ref 100–199)
HDL: 88 mg/dL (ref >39)
LDL Calculated: 132 mg/dL — ABNORMAL HIGH (ref 0–99)
LDl/HDL Ratio: 1.5 ratio (ref 0.0–3.2)
Triglycerides: 80 mg/dL (ref 0–149)
VLDL Cholesterol Cal: 16 mg/dL (ref 5–40)

## 2018-02-19 LAB — VITAMIN D 25 HYDROXY (VIT D DEFICIENCY, FRACTURES): Vit D, 25-Hydroxy: 11.6 ng/mL — ABNORMAL LOW (ref 30.0–100.0)

## 2018-02-19 LAB — HEMOGLOBIN A1C
Est. average glucose Bld gHb Est-mCnc: 120 mg/dL
Hgb A1c MFr Bld: 5.8 % — ABNORMAL HIGH (ref 4.8–5.6)

## 2018-02-19 LAB — T3: T3, Total: 91 ng/dL (ref 71–180)

## 2018-02-19 LAB — VITAMIN B12: Vitamin B-12: 411 pg/mL (ref 232–1245)

## 2018-02-19 LAB — FOLATE: Folate: 14.3 ng/mL (ref >3.0)

## 2018-02-19 LAB — INSULIN, RANDOM: INSULIN: 4.9 u[IU]/mL (ref 2.6–24.9)

## 2018-02-19 LAB — T4, FREE: Free T4: 1.28 ng/dL (ref 0.82–1.77)

## 2018-02-19 LAB — TSH: TSH: 0.802 u[IU]/mL (ref 0.450–4.500)

## 2018-03-04 ENCOUNTER — Ambulatory Visit (INDEPENDENT_AMBULATORY_CARE_PROVIDER_SITE_OTHER): Payer: Medicare Other | Admitting: Bariatrics

## 2018-03-04 VITALS — BP 109/65 | HR 51 | Temp 98.0°F | Ht 62.0 in | Wt 200.0 lb

## 2018-03-04 DIAGNOSIS — R7303 Prediabetes: Secondary | ICD-10-CM

## 2018-03-04 DIAGNOSIS — Z6836 Body mass index (BMI) 36.0-36.9, adult: Secondary | ICD-10-CM

## 2018-03-04 DIAGNOSIS — E559 Vitamin D deficiency, unspecified: Secondary | ICD-10-CM | POA: Diagnosis not present

## 2018-03-04 DIAGNOSIS — I10 Essential (primary) hypertension: Secondary | ICD-10-CM | POA: Diagnosis not present

## 2018-03-04 MED ORDER — VITAMIN D (ERGOCALCIFEROL) 1.25 MG (50000 UNIT) PO CAPS: 50000.0000 [IU] | ORAL_CAPSULE | ORAL | 0 refills | Status: DC

## 2018-03-04 MED FILL — VIT D2 1.25 MG (50,000 UNIT: 1.25 MG | 28 days supply | Qty: 4 | Fill #0

## 2018-03-05 ENCOUNTER — Encounter (INDEPENDENT_AMBULATORY_CARE_PROVIDER_SITE_OTHER): Payer: Self-pay

## 2018-03-06 ENCOUNTER — Ambulatory Visit
Admission: RE | Admit: 2018-03-06 | Discharge: 2018-03-06 | Disposition: A | Discharge status: Home / Self Care | Payer: Medicare Other | Source: Ambulatory Visit | Attending: Adult Health | Admitting: Adult Health

## 2018-03-06 ENCOUNTER — Telehealth: Payer: Self-pay

## 2018-03-06 DIAGNOSIS — R7303 Prediabetes: Secondary | ICD-10-CM | POA: Insufficient documentation

## 2018-03-06 DIAGNOSIS — M85852 Other specified disorders of bone density and structure, left thigh: Secondary | ICD-10-CM | POA: Diagnosis not present

## 2018-03-06 DIAGNOSIS — R922 Inconclusive mammogram: Secondary | ICD-10-CM | POA: Diagnosis not present

## 2018-03-06 DIAGNOSIS — C50512 Malignant neoplasm of lower-outer quadrant of left female breast: Secondary | ICD-10-CM

## 2018-03-06 DIAGNOSIS — Z780 Asymptomatic menopausal state: Secondary | ICD-10-CM | POA: Diagnosis not present

## 2018-03-06 DIAGNOSIS — E2839 Other primary ovarian failure: Secondary | ICD-10-CM

## 2018-03-06 DIAGNOSIS — Z170 Estrogen receptor positive status [ER+]: Secondary | ICD-10-CM

## 2018-03-06 HISTORY — DX: Personal history of antineoplastic chemotherapy: Z92.21

## 2018-03-06 HISTORY — DX: Personal history of irradiation: Z92.3

## 2018-03-06 NOTE — Telephone Encounter (Signed)
-----   Message from Gardenia Phlegm, NP sent at 03/06/2018  2:08 PM EDT ----- Bone density is slightly low.  Recommend calcium vitamin d and weight bearing exercises.  Repeat in 2 years ----- Message ----- From: Interface, Rad Results In Sent: 03/06/2018  10:30 AM EDT To: Gardenia Phlegm, NP

## 2018-03-06 NOTE — Telephone Encounter (Signed)
Spoke with pt informing of BD results and NP recommendations.  Pt is already taking calcium and vitamin d.  Pt understands to have BD in two years.  She did not have any questions or concerns at this time but knows to call back if any issues arise.

## 2018-03-06 NOTE — Progress Notes (Signed)
Office: [phone]  /  Fax: [phone]   HPI:   Chief Complaint: OBESITY [name] is here to discuss her progress with her obesity treatment plan. She is on the Category 2 plan and is following her eating plan approximately 98 % of the time. She states she is walking and doing leg lifts 20 minutes 4 times per week. 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 She reports it is "hard to get everything in " at dinner. She denies hunger and significant cravings. She is taking calcium gummies and a mulitvitamin daily.  Her weight is 200 lb (90.7 kg) today and has had a weight loss of 4 pounds over a period of 2 weeks since her last visit. She has lost 4 lbs since starting treatment with [name].  Hypertension [name] is a 57 y.o. female with hypertension. [name] denies lightheadedness. She is working on weight loss to help control her blood pressure with the goal of decreasing her risk of heart attack and stroke. She is taking lisinopril 33m. [name] blood pressure is currently well controlled.  Vitamin D deficiency [name] a diagnosis of vitamin D deficiency. She is currently taking vit D and her last vitamin D level was 11.6 on 02/18/18. She denies nausea, vomiting or muscle weakness.  Pre-Diabetes [name] a new diagnosis of pre-diabetes based on her elevated Hgb A1c and was informed this puts her at greater risk of developing diabetes. Her A1c was 5.8 and Insulin was 4.9 on 02/18/18. She is not taking metformin currently and continues to work on diet and exercise to decrease risk of diabetes.   ALLERGIES: Allergies  Allergen Reactions  . Aspirin Other (See Comments)    ESOPHAGITIS  . Oxycodone Hcl Diarrhea and Nausea And Vomiting  . Propoxyphene N-Acetaminophen Diarrhea and Nausea And Vomiting  . Tramadol Other (See Comments)    Cause migraines  . Adhesive [Tape] Rash and Other (See Comments)    Pulls skin off - please use paper tape  . Prednisone Rash      MEDICATIONS: Current Outpatient Medications on File Prior to Visit  Medication Sig Dispense Refill  . albuterol (PROAIR HFA) 108 (90 Base) MCG/ACT inhaler Inhale 2 puffs into the lungs every 6 (six) hours as needed for wheezing or shortness of breath.    . Calcium Citrate-Vitamin D (CALCIUM CITRATE +D PO) Take 2 tablets by mouth daily. Calcium 600 mg     . diphenhydrAMINE-APAP, sleep, (TYLENOL PM EXTRA STRENGTH) 50-1000 MG/30ML LIQD Take by mouth.    . letrozole (FEMARA) 2.5 MG tablet Take 1 tablet (2.5 mg total) by mouth daily. 90 tablet 3  . lisinopril (PRINIVIL,ZESTRIL) 20 MG tablet Take 20 mg by mouth daily.    . mometasone (ELOCON) 0.1 % cream Apply 1 application topically daily as needed (rash - summer eczema). Apply to arms    . Multiple Vitamin (MULTIVITAMIN WITH MINERALS) TABS tablet Take 1 tablet by mouth at bedtime.    . orphenadrine (NORFLEX) 100 MG tablet Take 100 mg by mouth 2 (two) times daily.    .Marland Kitchenoxybutynin (DITROPAN) 5 MG tablet TAKE 1 TABLET BY MOUTH 3 TIMES DAILY 270 tablet 0  . ranitidine (ZANTAC) 300 MG capsule Take 1 capsule (300 mg total) by mouth 2 (two) times daily. 60 capsule 3  . silver sulfADIAZINE (SILVADENE) 1 % cream Apply 1 application topically 2 (two) times daily as needed (stomach tears). 50 g 0  . valACYclovir (VALTREX) 1000 MG tablet Take 1 tablet (  1,000 mg total) by mouth 3 (three) times daily. 21 tablet 0   No current facility-administered medications on file prior to visit.     PAST MEDICAL HISTORY: Past Medical History:  Diagnosis Date  . Anemia    since bypass  . Arthritis    osteoarthritis  . Asthma    states no asthma attack since 2002  . Back pain   . Breast cancer ([place]) 06/26/16 bx   left breast  . Breast cancer ([place])   . Chronic back pain   . Complication of anesthesia    states takes more than normal to put her to sleep  . Constipation   . Dental bridge present    upper  . Dental crowns present   . DVT of upper extremity  (deep vein thrombosis) ([place])   . Fibromyalgia   . Genetic testing 09/19/2016   Ms. Motter underwent genetic counseling and testing for hereditary cancer syndromes on 08/21/2016. Her results were negative for mutations in all 46 genes analyzed by [facility]'s 46-gene Common Hereditary Cancers Panel. Genes analyzed include: APC, ATM, AXIN2, BARD1, BMPR1A, BRCA1, BRCA2, BRIP1, CDH1, CDKN2A, CHEK2, CTNNA1, DICER1, EPCAM, GREM1, HOXB13, KIT, MEN1, MLH1, MSH2, MSH3, MSH6, MUTYH, NBN,   . Headache(784.0)    migraines  . History of blood transfusion 06/2005  . History of gallstones   . History of pneumonia   . History of shingles 07/2011  . History of shingles   . HTN (hypertension)   . Itching   . Joint pain   . Muscle weakness   . Neuropathy   . Normal coronary arteries 2003  . Osteoarthropathy   . PFO (patent foramen ovale)   . Presence of inferior vena cava filter   . Pulmonary embolism ([place])   . Sleep apnea    used CPAP until after bypass surg.  . Status post gastric bypass for obesity   . Stomach pain   . Stroke [facility]) 12/2002   right-sided weakness  . Urinary incontinence     PAST SURGICAL HISTORY: Past Surgical History:  Procedure Laterality Date  . ABDOMINAL HYSTERECTOMY  11/1997   complete  . ANTERIOR CERVICAL DECOMP/DISCECTOMY FUSION  02/05/2005   C5-6  . APPENDECTOMY  10/22/2008   laparoscopic  . BREAST LUMPECTOMY WITH RADIOACTIVE SEED AND SENTINEL LYMPH NODE BIOPSY Left 07/26/2016   Procedure: BREAST LUMPECTOMY WITH RADIOACTIVE SEED AND SENTINEL LYMPH NODE BIOPSY;  Surgeon: Erroll Luna, MD;  Location: [name];  Service: General;  Laterality: Left;  . BUNIONECTOMY  05/1980   both feet  . BUNIONECTOMY  08/2011   left foot  . CARDIAC CATHETERIZATION  03/04/2002  . CARPAL TUNNEL RELEASE  06/21/2009   right  . CARPAL TUNNEL RELEASE     left hand  . CARPAL TUNNEL RELEASE  10/03/2011   Procedure: CARPAL TUNNEL RELEASE;  Surgeon: Wynonia Sours, MD;  Location: [facility];  Service: Orthopedics;  Laterality: Right;  CARPAL TUNNEL WITH HYPOTHENAR FAT PAD TRANSFER  . CERVICAL SPINE SURGERY  01/2005   titanium plate implanted  . CHOLECYSTECTOMY  1990  . ELBOW SURGERY  08/09/2004   decompression ulnar nerve right elbow  . ENTEROLYSIS  10/22/2008   laparoscopic abd. enterolysis  . GASTRIC ROUX-EN-Y  2009  . HEEL SPUR SURGERY  08/1997   left  . HEMORRHOID SURGERY  03/1993  . LAPAROSCOPIC LYSIS INTESTINAL ADHESIONS  02/14/2000  . NAILBED REPAIR  01/10/2005; 08/2011   exc. matrix bilat. great toe  .  OTHER SURGICAL HISTORY  12/1986   pt states that she had surgery to unclog her fallopean tubes  . PORTACATH PLACEMENT N/A 09/24/2016   Procedure: INSERTION PORT-A-CATH WITH [name];  Surgeon: Erroll Luna, MD;  Location: [facility];  Service: General;  Laterality: N/A;  . SHOULDER SURGERY     bilat. - (left:  06/2005)  . TONSILLECTOMY  07/1995  . TRIGGER FINGER RELEASE  04/25/2006   decompression A-1 pulley left thumb  . UTERINE FIBROID SURGERY  12/95, 7/96   x2  . VENA CAVA FILTER PLACEMENT  2009   during Roux-en-Y surg.    SOCIAL HISTORY: Social History   Tobacco Use  . Smoking status: Former Research scientist (life sciences)  . Smokeless tobacco: Never Used  . Tobacco comment: quit smoking 08/1989  Substance Use Topics  . Alcohol use: No  . Drug use: No    FAMILY HISTORY: Family History  Problem Relation Age of Onset  . Breast cancer Paternal Aunt 67  . Cervical cancer Paternal Grandmother 33       d.40s  . Ovarian cancer Maternal Grandmother 23       d.23  . Colon polyps Father   . Diabetes Father        borderline  . Prostate cancer Father   . High blood pressure Father   . Hypertension Mother   . Hypertension Unknown   . Breast cancer Sister 61       treated with neoadjuvant chemo/radiation and lumpectomy    ROS: Review of Systems  Constitutional: Positive for weight loss.  Gastrointestinal: Negative for nausea and vomiting.  Musculoskeletal:       Negative  for muscle weakness.  Neurological:       Negative for lightheadedness.    PHYSICAL EXAM: Blood pressure 109/65, pulse (!) 51, temperature 98 F (36.7 C), temperature source Oral, height 5' 2"  (1.575 m), weight 200 lb (90.7 kg), SpO2 99 %. Body mass index is 36.58 kg/m. Physical Exam  Constitutional: She is oriented to person, place, and time. She appears well-developed and well-nourished.  Cardiovascular: Normal rate.  Pulmonary/Chest: Effort normal.  Musculoskeletal: Normal range of motion.  Neurological: She is oriented to person, place, and time.  Skin: Skin is warm and dry.  Psychiatric: She has a normal mood and affect. Her behavior is normal.  Vitals reviewed.   RECENT LABS AND TESTS: BMET    Component Value Date/Time   NA 136 02/18/2018 1029   NA 138 04/19/2017 1005   K 4.5 02/18/2018 1029   K 4.0 04/19/2017 1005   CL 98 02/18/2018 1029   CO2 23 02/18/2018 1029   CO2 25 04/19/2017 1005   GLUCOSE 98 02/18/2018 1029   GLUCOSE 80 11/26/2017 1544   GLUCOSE 87 04/19/2017 1005   BUN 12 02/18/2018 1029   BUN 12.0 04/19/2017 1005   CREATININE 0.68 02/18/2018 1029   CREATININE 0.77 11/26/2017 1544   CREATININE 0.7 04/19/2017 1005   CALCIUM 9.6 02/18/2018 1029   CALCIUM 8.9 04/19/2017 1005   GFRNONAA 98 02/18/2018 1029   GFRNONAA >60 11/26/2017 1544   GFRAA 113 02/18/2018 1029   GFRAA >60 11/26/2017 1544   Lab Results  Component Value Date   HGBA1C 5.8 (H) 02/18/2018   Lab Results  Component Value Date   INSULIN 4.9 02/18/2018   CBC    Component Value Date/Time   WBC 4.3 11/26/2017 1544   WBC 3.5 (L) 04/19/2017 1005   WBC 7.5 12/14/2016 0120   RBC 3.62 (L) 11/26/2017 1544  HGB 11.5 (L) 11/26/2017 1544   HGB 10.2 (L) 04/19/2017 1005   HCT 34.8 11/26/2017 1544   HCT 31.1 (L) 04/19/2017 1005   PLT 192 11/26/2017 1544   PLT 184 04/19/2017 1005   MCV 96.1 11/26/2017 1544   MCV 97.5 04/19/2017 1005   MCH 31.8 11/26/2017 1544   MCHC 33.0 11/26/2017 1544     RDW 14.5 11/26/2017 1544   RDW 15.3 (H) 04/19/2017 1005   LYMPHSABS 1.3 11/26/2017 1544   LYMPHSABS 1.0 04/19/2017 1005   MONOABS 0.4 11/26/2017 1544   MONOABS 0.3 04/19/2017 1005   EOSABS 0.1 11/26/2017 1544   EOSABS 0.1 04/19/2017 1005   BASOSABS 0.0 11/26/2017 1544   BASOSABS 0.0 04/19/2017 1005   Iron/TIBC/Ferritin/ %Sat No results found for: IRON, TIBC, FERRITIN, IRONPCTSAT Lipid Panel     Component Value Date/Time   CHOL 236 (H) 02/18/2018 1029   TRIG 80 02/18/2018 1029   HDL 88 02/18/2018 1029   CHOLHDL 2.6 09/30/2015 1838   VLDL 12 09/30/2015 1838   LDLCALC 132 (H) 02/18/2018 1029   Hepatic Function Panel     Component Value Date/Time   PROT 6.9 02/18/2018 1029   PROT 6.7 04/19/2017 1005   ALBUMIN 4.5 02/18/2018 1029   ALBUMIN 3.9 04/19/2017 1005   AST 18 02/18/2018 1029   AST 17 11/26/2017 1544   AST 14 04/19/2017 1005   ALT 10 02/18/2018 1029   ALT 14 11/26/2017 1544   ALT 11 04/19/2017 1005   ALKPHOS 81 02/18/2018 1029   ALKPHOS 50 04/19/2017 1005   BILITOT 0.5 02/18/2018 1029   BILITOT 0.4 11/26/2017 1544   BILITOT 0.31 04/19/2017 1005      Component Value Date/Time   TSH 0.802 02/18/2018 1029   Results for LOVEY, CRUPI (MRN [id number]) as of 03/06/2018 09:58  Ref. Range 02/18/2018 10:29  Vitamin D, 25-Hydroxy Latest Ref Range: 30.0 - 100.0 ng/mL 11.6 (L)   ASSESSMENT AND PLAN: Essential hypertension  Vitamin D deficiency - Plan: Vitamin D, Ergocalciferol, (DRISDOL) 50000 units CAPS capsule  Pre-diabetes  At risk for diabetes mellitus  Class 2 severe obesity with serious comorbidity and body mass index (BMI) of 36.0 to 36.9 in adult, unspecified obesity type ([place])  PLAN:  Hypertension We discussed sodium restriction, working on healthy weight loss, and a regular exercise program as the means to achieve improved blood pressure control. Dorrine agreed with this plan and agreed to follow up as directed. We will continue to monitor her  blood pressure as well as her progress with the above lifestyle modifications. She will continue her antihypertensive medications as prescribed and will watch for signs of hypotension as she continues her lifestyle modifications.  Vitamin D Deficiency Kema was informed that low vitamin D levels contributes to fatigue and are associated with obesity, breast, and colon cancer. She agrees to [name] to take prescription Vit D '@50'$ ,000 IU every week #4 with no refills and will follow up for routine testing of vitamin D, at least 2-3 times per year. She was informed of the risk of over-replacement of vitamin D and agrees to not increase her dose unless she discusses this with [name] first. [name] agrees to increase vitamin D rich foods in her diet and follow up in 2 weeks.  Pre-Diabetes [name] will continue to work on weight loss, exercise, and decreasing simple carbohydrates in her diet to help decrease the risk of diabetes. She was informed that eating too many simple carbohydrates or too many calories at one  sitting increases the likelihood of GI side effects. Our registered dietitian and I discussed pre-diabetes with her and she agrees to decrease carbohydrates and increase protein. [name] agreed to follow up with [name] as directed to monitor her progress.  Obesity [name] is currently in the action stage of change. As such, her goal is to continue with weight loss efforts. She has agreed to follow the Category 2 plan. She agrees to increase protein and decrease carbohydrates. [name] has been instructed to work up to a goal of 150 minutes of combined cardio and strengthening exercise per week for weight loss and overall health benefits. We discussed the following Behavioral Modification Strategies today: increasing lean protein intake, decreasing simple carbohydrates, increasing vegetables, increase H2O intake, decreasing sodium intake, and no skipping meals.  [name] has agreed to follow up with  our clinic in 2 weeks. She was informed of the importance of frequent follow up visits to maximize her success with intensive lifestyle modifications for her multiple health conditions.   OBESITY BEHAVIORAL INTERVENTION VISIT  Today's visit was # 2   Starting weight: 204 lbs Starting date: 02/18/18 Today's weight : Weight: 200 lb (90.7 kg)  Today's date: 03/04/2018 Total lbs lost to date: 4 At least 15 minutes were spent on discussing the following behavioral intervention visit.  ASK: We discussed the diagnosis of obesity with [name] today and [name] agreed to give [name] permission to discuss obesity behavioral modification therapy today.  ASSESS: [name] has the diagnosis of obesity and her BMI today is 36.57. [name] is in the action stage of change.   ADVISE: Akire was educated on the multiple health risks of obesity as well as the benefit of weight loss to improve her health. She was advised of the need for long term treatment and the importance of lifestyle modifications to improve her current health and to decrease her risk of future health problems.  AGREE: Multiple dietary modification options and treatment options were discussed and [name] agreed to follow the recommendations documented in the above note.  ARRANGE: Danylle was educated on the importance of frequent visits to treat obesity as outlined per CMS and USPSTF guidelines and agreed to schedule her next follow up appointment today.  I, Marcille Blanco, am acting as Location manager for [employer]. Owens Shark, DO  I have reviewed the above documentation for accuracy and completeness, and I agree with the above. -Jearld Lesch, DO

## 2018-03-07 MED FILL — LISINOPRIL 10 MG TABLET: 10 | 90 days supply | Qty: 90 | Fill #0

## 2018-03-10 ENCOUNTER — Encounter (INDEPENDENT_AMBULATORY_CARE_PROVIDER_SITE_OTHER): Payer: Self-pay | Admitting: Bariatrics

## 2018-03-18 ENCOUNTER — Ambulatory Visit (INDEPENDENT_AMBULATORY_CARE_PROVIDER_SITE_OTHER): Payer: Medicare Other | Admitting: Bariatrics

## 2018-03-18 VITALS — BP 110/72 | HR 66 | Temp 98.5°F | Ht 62.0 in | Wt 199.0 lb

## 2018-03-18 DIAGNOSIS — K5909 Other constipation: Secondary | ICD-10-CM | POA: Diagnosis not present

## 2018-03-18 DIAGNOSIS — I10 Essential (primary) hypertension: Secondary | ICD-10-CM

## 2018-03-18 DIAGNOSIS — Z6836 Body mass index (BMI) 36.0-36.9, adult: Secondary | ICD-10-CM

## 2018-03-18 DIAGNOSIS — E66812 Obesity, class 2: Secondary | ICD-10-CM

## 2018-03-18 DIAGNOSIS — Z9189 Other specified personal risk factors, not elsewhere classified: Secondary | ICD-10-CM | POA: Diagnosis not present

## 2018-03-18 DIAGNOSIS — E559 Vitamin D deficiency, unspecified: Secondary | ICD-10-CM | POA: Diagnosis not present

## 2018-03-18 MED ORDER — VITAMIN D (ERGOCALCIFEROL) 1.25 MG (50000 UNIT) PO CAPS: 50000.0000 [IU] | ORAL_CAPSULE | ORAL | 0 refills | Status: DC

## 2018-03-18 MED FILL — VIT D2 1.25 MG (50,000 UNIT: 1.25 MG | 28 days supply | Qty: 4 | Fill #0

## 2018-03-18 NOTE — Progress Notes (Signed)
Office: [phone]  /  Fax: [phone]   HPI:   Chief Complaint: OBESITY [name] is here to discuss her progress with her obesity treatment plan. She is on the  follow the Category 2 plan and is following her eating plan approximately 92 % of the time. She states she is exercising by walking for 30 minutes 3 times per week. [name] has been going to the hospital to visit her mother. She reports her hunger is controlled and no unusual cravings.   Her weight is 199 lb (90.3 kg) today and has had a weight loss of 1 pounds over a period of 2 weeks since her last visit. She has lost 5 lbs since starting treatment with [name].  Vitamin D deficiency [name] has a diagnosis of vitamin D deficiency. She is currently taking vit D and denies nausea, vomiting or muscle weakness.  Ref. Range 02/18/2018 10:29  Vitamin D, 25-Hydroxy Latest Ref Range: 30.0 - 100.0 ng/mL 11.6 (L)   Hypertension [name] is a 57 y.o. female with hypertension.  [name] denies chest pain, lightheadedness, or shortness of breath on exertion. She is working weight loss to help control her blood pressure with the goal of decreasing her risk of heart attack and stroke. [name] blood pressure is currently controlled. She is currently taking lisinopril 20 mg qd.   Constipation [name] notes constipation for the last few weeks, worse since attempting weight loss. She states BM are less frequent and are not hard and painful. She denies hematochezia or melena. She denies drinking less H20 recently.  At risk for osteopenia and osteoporosis [name] is at higher risk of osteopenia and osteoporosis due to vitamin D deficiency.   ALLERGIES: Allergies  Allergen Reactions  . Aspirin Other (See Comments)    ESOPHAGITIS  . Oxycodone Hcl Diarrhea and Nausea And Vomiting  . Propoxyphene N-Acetaminophen Diarrhea and Nausea And Vomiting  . Tramadol Other (See Comments)    Cause migraines  . Adhesive [Tape] Rash and  Other (See Comments)    Pulls skin off - please use paper tape  . Prednisone Rash    MEDICATIONS: Current Outpatient Medications on File Prior to Visit  Medication Sig Dispense Refill  . albuterol (PROAIR HFA) 108 (90 Base) MCG/ACT inhaler Inhale 2 puffs into the lungs every 6 (six) hours as needed for wheezing or shortness of breath.    [name] Kitchen aspirin EC 81 MG tablet Take 81 mg by mouth daily.    . Calcium Citrate-Vitamin D (CALCIUM CITRATE +D PO) Take 2 tablets by mouth daily. Calcium 600 mg     . diphenhydrAMINE-APAP, sleep, (TYLENOL PM EXTRA STRENGTH) 50-1000 MG/30ML LIQD Take by mouth.    . letrozole (FEMARA) 2.5 MG tablet Take 1 tablet (2.5 mg total) by mouth daily. 90 tablet 3  . lisinopril (PRINIVIL,ZESTRIL) 20 MG tablet Take 20 mg by mouth daily.    . mometasone (ELOCON) 0.1 % cream Apply 1 application topically daily as needed (rash - summer eczema). Apply to arms    . Multiple Vitamin (MULTIVITAMIN WITH MINERALS) TABS tablet Take 1 tablet by mouth at bedtime.    . orphenadrine (NORFLEX) 100 MG tablet Take 100 mg by mouth 2 (two) times daily.    [name] Kitchen oxybutynin (DITROPAN) 5 MG tablet TAKE 1 TABLET BY MOUTH 3 TIMES DAILY 270 tablet 0  . ranitidine (ZANTAC) 300 MG capsule Take 1 capsule (300 mg total) by mouth 2 (two) times daily. 60 capsule 3  . silver sulfADIAZINE (SILVADENE) 1 %  cream Apply 1 application topically 2 (two) times daily as needed (stomach tears). 50 g 0  . valACYclovir (VALTREX) 1000 MG tablet Take 1 tablet (1,000 mg total) by mouth 3 (three) times daily. 21 tablet 0   No current facility-administered medications on file prior to visit.     PAST MEDICAL HISTORY: Past Medical History:  Diagnosis Date  . Anemia    since bypass  . Arthritis    osteoarthritis  . Asthma    states no asthma attack since 2002  . Back pain   . Breast cancer ([place]) 06/26/16 bx   left breast  . Breast cancer ([place])   . Chronic back pain   . Complication of anesthesia    states takes more  than normal to put her to sleep  . Constipation   . Dental bridge present    upper  . Dental crowns present   . DVT of upper extremity (deep vein thrombosis) ([place])   . Fibromyalgia   . Genetic testing 09/19/2016   Ms. Player underwent genetic counseling and testing for hereditary cancer syndromes on 08/21/2016. Her results were negative for mutations in all 46 genes analyzed by [facility]'s 46-gene Common Hereditary Cancers Panel. Genes analyzed include: APC, ATM, AXIN2, BARD1, BMPR1A, BRCA1, BRCA2, BRIP1, CDH1, CDKN2A, CHEK2, CTNNA1, DICER1, EPCAM, GREM1, HOXB13, KIT, MEN1, MLH1, MSH2, MSH3, MSH6, MUTYH, NBN,   . Headache(784.0)    migraines  . History of blood transfusion 06/2005  . History of gallstones   . History of pneumonia   . History of shingles 07/2011  . History of shingles   . HTN (hypertension)   . Itching   . Joint pain   . Muscle weakness   . Neuropathy   . Normal coronary arteries 2003  . Osteoarthropathy   . Personal history of chemotherapy 2018  . Personal history of radiation therapy 2018  . PFO (patent foramen ovale)   . Presence of inferior vena cava filter   . Pulmonary embolism ([place])   . Sleep apnea    used CPAP until after bypass surg.  . Status post gastric bypass for obesity   . Stomach pain   . Stroke [facility]) 12/2002   right-sided weakness  . Urinary incontinence     PAST SURGICAL HISTORY: Past Surgical History:  Procedure Laterality Date  . ABDOMINAL HYSTERECTOMY  11/1997   complete  . ANTERIOR CERVICAL DECOMP/DISCECTOMY FUSION  02/05/2005   C5-6  . APPENDECTOMY  10/22/2008   laparoscopic  . BREAST LUMPECTOMY Left 07/2016  . BREAST LUMPECTOMY WITH RADIOACTIVE SEED AND SENTINEL LYMPH NODE BIOPSY Left 07/26/2016   Procedure: BREAST LUMPECTOMY WITH RADIOACTIVE SEED AND SENTINEL LYMPH NODE BIOPSY;  Surgeon: Erroll Luna, MD;  Location: [place];  Service: General;  Laterality: Left;  . BUNIONECTOMY  05/1980   both feet  . BUNIONECTOMY   08/2011   left foot  . CARDIAC CATHETERIZATION  03/04/2002  . CARPAL TUNNEL RELEASE  06/21/2009   right  . CARPAL TUNNEL RELEASE     left hand  . CARPAL TUNNEL RELEASE  10/03/2011   Procedure: CARPAL TUNNEL RELEASE;  Surgeon: Wynonia Sours, MD;  Location: [place];  Service: Orthopedics;  Laterality: Right;  CARPAL TUNNEL WITH HYPOTHENAR FAT PAD TRANSFER  . CERVICAL SPINE SURGERY  01/2005   titanium plate implanted  . CHOLECYSTECTOMY  1990  . ELBOW SURGERY  08/09/2004   decompression ulnar nerve right elbow  . ENTEROLYSIS  10/22/2008   laparoscopic abd.  enterolysis  . GASTRIC ROUX-EN-Y  2009  . HEEL SPUR SURGERY  08/1997   left  . HEMORRHOID SURGERY  03/1993  . LAPAROSCOPIC LYSIS INTESTINAL ADHESIONS  02/14/2000  . NAILBED REPAIR  01/10/2005; 08/2011   exc. matrix bilat. great toe  . OTHER SURGICAL HISTORY  12/1986   pt states that she had surgery to unclog her fallopean tubes  . PORTACATH PLACEMENT N/A 09/24/2016   Procedure: INSERTION PORT-A-CATH WITH [name];  Surgeon: Erroll Luna, MD;  Location: [place];  Service: General;  Laterality: N/A;  . SHOULDER SURGERY     bilat. - (left:  06/2005)  . TONSILLECTOMY  07/1995  . TRIGGER FINGER RELEASE  04/25/2006   decompression A-1 pulley left thumb  . UTERINE FIBROID SURGERY  12/95, 7/96   x2  . VENA CAVA FILTER PLACEMENT  2009   during Roux-en-Y surg.    SOCIAL HISTORY: Social History   Tobacco Use  . Smoking status: Former Research scientist (life sciences)  . Smokeless tobacco: Never Used  . Tobacco comment: quit smoking 08/1989  Substance Use Topics  . Alcohol use: No  . Drug use: No    FAMILY HISTORY: Family History  Problem Relation Age of Onset  . Breast cancer Paternal Aunt 65  . Cervical cancer Paternal Grandmother 9       d.40s  . Ovarian cancer Maternal Grandmother 23       d.23  . Colon polyps Father   . Diabetes Father        borderline  . Prostate cancer Father   . High blood pressure Father   . Hypertension Mother   .  Hypertension Unknown   . Breast cancer Sister 18       treated with neoadjuvant chemo/radiation and lumpectomy    ROS: Review of Systems  Respiratory: Negative for shortness of breath.   Cardiovascular: Negative for chest pain.  Gastrointestinal: Positive for constipation. Negative for melena, nausea and vomiting.       Negative for  hematochezia  Musculoskeletal:       Negative for muscle weakness  Neurological:       Negative for lightheadedness    PHYSICAL EXAM: Blood pressure 110/72, pulse 66, temperature 98.5 F (36.9 C), temperature source Oral, height 5' 2"  (1.575 m), weight 199 lb (90.3 kg), SpO2 100 %. Body mass index is 36.4 kg/m. Physical Exam  Constitutional: She is oriented to person, place, and time. She appears well-developed and well-nourished.  HENT:  Head: Normocephalic.  Neck: Normal range of motion.  Cardiovascular: Normal rate.  Pulmonary/Chest: Effort normal.  Musculoskeletal: Normal range of motion.  Neurological: She is alert and oriented to person, place, and time.  Skin: Skin is warm and dry.  Psychiatric: She has a normal mood and affect. Her behavior is normal.  Vitals reviewed.   RECENT LABS AND TESTS: BMET    Component Value Date/Time   NA 136 02/18/2018 1029   NA 138 04/19/2017 1005   K 4.5 02/18/2018 1029   K 4.0 04/19/2017 1005   CL 98 02/18/2018 1029   CO2 23 02/18/2018 1029   CO2 25 04/19/2017 1005   GLUCOSE 98 02/18/2018 1029   GLUCOSE 80 11/26/2017 1544   GLUCOSE 87 04/19/2017 1005   BUN 12 02/18/2018 1029   BUN 12.0 04/19/2017 1005   CREATININE 0.68 02/18/2018 1029   CREATININE 0.77 11/26/2017 1544   CREATININE 0.7 04/19/2017 1005   CALCIUM 9.6 02/18/2018 1029   CALCIUM 8.9 04/19/2017 1005   GFRNONAA 98 02/18/2018  [place] 11/26/2017 1544   GFRAA 113 02/18/2018 1029   GFRAA >60 11/26/2017 1544   Lab Results  Component Value Date   HGBA1C 5.8 (H) 02/18/2018   Lab Results  Component Value Date   INSULIN  4.9 02/18/2018   CBC    Component Value Date/Time   WBC 4.3 11/26/2017 1544   WBC 3.5 (L) 04/19/2017 1005   WBC 7.5 12/14/2016 0120   RBC 3.62 (L) 11/26/2017 1544   HGB 11.5 (L) 11/26/2017 1544   HGB 10.2 (L) 04/19/2017 1005   HCT 34.8 11/26/2017 1544   HCT 31.1 (L) 04/19/2017 1005   PLT 192 11/26/2017 1544   PLT 184 04/19/2017 1005   MCV 96.1 11/26/2017 1544   MCV 97.5 04/19/2017 1005   MCH 31.8 11/26/2017 1544   MCHC 33.0 11/26/2017 1544   RDW 14.5 11/26/2017 1544   RDW 15.3 (H) 04/19/2017 1005   LYMPHSABS 1.3 11/26/2017 1544   LYMPHSABS 1.0 04/19/2017 1005   MONOABS 0.4 11/26/2017 1544   MONOABS 0.3 04/19/2017 1005   EOSABS 0.1 11/26/2017 1544   EOSABS 0.1 04/19/2017 1005   BASOSABS 0.0 11/26/2017 1544   BASOSABS 0.0 04/19/2017 1005   Iron/TIBC/Ferritin/ %Sat No results found for: IRON, TIBC, FERRITIN, IRONPCTSAT Lipid Panel     Component Value Date/Time   CHOL 236 (H) 02/18/2018 1029   TRIG 80 02/18/2018 1029   HDL 88 02/18/2018 1029   CHOLHDL 2.6 09/30/2015 1838   VLDL 12 09/30/2015 1838   LDLCALC 132 (H) 02/18/2018 1029   Hepatic Function Panel     Component Value Date/Time   PROT 6.9 02/18/2018 1029   PROT 6.7 04/19/2017 1005   ALBUMIN 4.5 02/18/2018 1029   ALBUMIN 3.9 04/19/2017 1005   AST 18 02/18/2018 1029   AST 17 11/26/2017 1544   AST 14 04/19/2017 1005   ALT 10 02/18/2018 1029   ALT 14 11/26/2017 1544   ALT 11 04/19/2017 1005   ALKPHOS 81 02/18/2018 1029   ALKPHOS 50 04/19/2017 1005   BILITOT 0.5 02/18/2018 1029   BILITOT 0.4 11/26/2017 1544   BILITOT 0.31 04/19/2017 1005      Component Value Date/Time   TSH 0.802 02/18/2018 1029    Ref. Range 02/18/2018 10:29  Vitamin D, 25-Hydroxy Latest Ref Range: 30.0 - 100.0 ng/mL 11.6 (L)    ASSESSMENT AND PLAN: Vitamin D deficiency - Plan: Vitamin D, Ergocalciferol, (DRISDOL) 50000 units CAPS capsule  Essential hypertension  Other constipation  At risk for osteoporosis  Class 2 severe  obesity with serious comorbidity and body mass index (BMI) of 36.0 to 36.9 in adult, unspecified obesity type ([place])  PLAN: Vitamin D Deficiency [name] was informed that low vitamin D levels contributes to fatigue and are associated with obesity, breast, and colon cancer. She agrees to continue to take prescription Vit D '@50'$ ,000 IU every week #4 with no refills and will follow up for routine testing of vitamin D, at least 2-3 times per year. She was informed of the risk of over-replacement of vitamin D and agrees to not increase her dose unless she discusses this with [name] first. Agrees to follow up with our clinic as directed.   Hypertension We discussed sodium restriction, working on healthy weight loss, and a regular exercise program as the means to achieve improved blood pressure control. [name] agreed with this plan and agreed to follow up as directed. We will continue to monitor her blood pressure as well as her progress with the  above lifestyle modifications. She will continue her medications as prescribed and will watch for signs of hypotension as she continues her lifestyle modifications.  Constipation [name] was informed decrease bowel movement frequency is normal while losing weight, but stools should not be hard or painful. She was advised to increase her H20 intake and work on increasing her fiber intake. High fiber foods were discussed today. She will [name] Miralax every other day.   At risk for osteopenia and osteoporosis [name] was given extended  (15 minutes) osteoporosis prevention counseling today. [place] is at risk for osteopenia and osteoporosis due to her vitamin D deficiency. She was encouraged to take her vitamin D and follow her higher calcium diet and increase strengthening exercise to help strengthen her bones and decrease her risk of osteopenia and osteoporosis.  Obesity [name] is currently in the action stage of change. As such, her goal is to continue with  weight loss efforts She has agreed to follow the Category 2 plan  Discussed trying to get in as much as possible.  [name] has been instructed to work up to a goal of 150 minutes of combined cardio and strengthening exercise per week for weight loss and overall health benefits. We discussed the following Behavioral Modification Strategies today: increasing lean protein intake, decreasing simple carbohydrates , increasing vegetables, increasing water intake, no skipping meals, better snacking choices, keeping healthy foods in the home, and work on meal planning and easy cooking plans   [name] has agreed to follow up with our clinic in 2 weeks. She was informed of the importance of frequent follow up visits to maximize her success with intensive lifestyle modifications for her multiple health conditions.   OBESITY BEHAVIORAL INTERVENTION VISIT  Today's visit was # 3   Starting weight: 204 lb Starting date: 02/18/18 Today's weight : 199 lb Today's date: 03/17/18 Total lbs lost to date: 5 lb    ASK: We discussed the diagnosis of obesity with [name] today and [name] agreed to give [name] permission to discuss obesity behavioral modification therapy today.  ASSESS: [name] has the diagnosis of obesity and her BMI today is 36.39 [name] is in the action stage of change   ADVISE: [name] was educated on the multiple health risks of obesity as well as the benefit of weight loss to improve her health. She was advised of the need for long term treatment and the importance of lifestyle modifications to improve her current health and to decrease her risk of future health problems.  AGREE: Multiple dietary modification options and treatment options were discussed and  [name] agreed to follow the recommendations documented in the above note.  ARRANGE: [name] was educated on the importance of frequent visits to treat obesity as outlined per CMS and USPSTF guidelines and agreed to  schedule her next follow up appointment today.  [name], am acting as transcriptionist for [employer], DO   I have reviewed the above documentation for accuracy and completeness, and I agree with the above. -Jearld Lesch, DO

## 2018-03-21 DIAGNOSIS — M65311 Trigger thumb, right thumb: Secondary | ICD-10-CM | POA: Diagnosis not present

## 2018-03-21 DIAGNOSIS — M65322 Trigger finger, left index finger: Secondary | ICD-10-CM | POA: Diagnosis not present

## 2018-03-24 ENCOUNTER — Other Ambulatory Visit: Payer: Self-pay | Admitting: Adult Health

## 2018-03-26 ENCOUNTER — Encounter (INDEPENDENT_AMBULATORY_CARE_PROVIDER_SITE_OTHER): Payer: Self-pay | Admitting: Bariatrics

## 2018-04-01 ENCOUNTER — Ambulatory Visit (INDEPENDENT_AMBULATORY_CARE_PROVIDER_SITE_OTHER): Payer: Medicare Other | Admitting: Bariatrics

## 2018-04-01 VITALS — BP 128/75 | HR 60 | Temp 98.4°F | Ht 62.0 in | Wt 197.0 lb

## 2018-04-01 DIAGNOSIS — I10 Essential (primary) hypertension: Secondary | ICD-10-CM | POA: Diagnosis not present

## 2018-04-01 DIAGNOSIS — K5909 Other constipation: Secondary | ICD-10-CM

## 2018-04-01 DIAGNOSIS — E559 Vitamin D deficiency, unspecified: Secondary | ICD-10-CM | POA: Diagnosis not present

## 2018-04-01 DIAGNOSIS — Z6836 Body mass index (BMI) 36.0-36.9, adult: Secondary | ICD-10-CM

## 2018-04-01 MED ORDER — POLYETHYLENE GLYCOL 3350 17 GM/SCOOP PO POWD: 17.0000 g | Freq: Every day | ORAL | 0 refills | Status: DC

## 2018-04-02 NOTE — Progress Notes (Signed)
Office: [phone]  /  Fax: [phone]   HPI:   Chief Complaint: OBESITY [name] is here to discuss her progress with her obesity treatment plan. She is on the Category 2 plan and is following her eating plan approximately 98 % of the time. She states she is walking 30 minutes 3 times per week. [name] is doing okay on the Category 2 plan. She struggles with lunch at times. Her weight is 197 lb (89.4 kg) today and has had a weight loss of 2 pounds over a period of 2 weeks since her last visit. She has lost 7 lbs since starting treatment with [name].  Hypertension [name] is a 57 y.o. female with hypertension. She is currently taking Lisinopril. [name] denies lightheadedness. She is working weight loss to help control her blood pressure with the goal of decreasing her risk of heart attack and stroke. [name] blood pressure is currently controlled.  Vitamin D deficiency [name] has a diagnosis of vitamin D deficiency. She is currently taking high dose prescription vit D and her last level was at 11.6. She denies nausea, vomiting or muscle weakness.  Constipation [name] notes constipation. She has been retaining stools for several days. She denies hematochezia or melena.   ALLERGIES: Allergies  Allergen Reactions  . Aspirin Other (See Comments)    ESOPHAGITIS  . Oxycodone Hcl Diarrhea and Nausea And Vomiting  . Propoxyphene N-Acetaminophen Diarrhea and Nausea And Vomiting  . Tramadol Other (See Comments)    Cause migraines  . Adhesive [Tape] Rash and Other (See Comments)    Pulls skin off - please use paper tape  . Prednisone Rash    MEDICATIONS: Current Outpatient Medications on File Prior to Visit  Medication Sig Dispense Refill  . albuterol (PROAIR HFA) 108 (90 Base) MCG/ACT inhaler Inhale 2 puffs into the lungs every 6 (six) hours as needed for wheezing or shortness of breath.    Marland Kitchen aspirin EC 81 MG tablet Take 81 mg by mouth daily.    . Calcium  Citrate-Vitamin D (CALCIUM CITRATE +D PO) Take 2 tablets by mouth daily. Calcium 600 mg     . diphenhydrAMINE-APAP, sleep, (TYLENOL PM EXTRA STRENGTH) 50-1000 MG/30ML LIQD Take by mouth.    . letrozole (FEMARA) 2.5 MG tablet Take 1 tablet (2.5 mg total) by mouth daily. 90 tablet 3  . lisinopril (PRINIVIL,ZESTRIL) 20 MG tablet Take 20 mg by mouth daily.    . mometasone (ELOCON) 0.1 % cream Apply 1 application topically daily as needed (rash - summer eczema). Apply to arms    . Multiple Vitamin (MULTIVITAMIN WITH MINERALS) TABS tablet Take 1 tablet by mouth at bedtime.    . orphenadrine (NORFLEX) 100 MG tablet Take 100 mg by mouth 2 (two) times daily.    Marland Kitchen oxybutynin (DITROPAN) 5 MG tablet TAKE 1 TABLET BY MOUTH 3 TIMES DAILY 270 tablet 0  . ranitidine (ZANTAC) 300 MG capsule Take 1 capsule (300 mg total) by mouth 2 (two) times daily. 60 capsule 3  . silver sulfADIAZINE (SILVADENE) 1 % cream Apply 1 application topically 2 (two) times daily as needed (stomach tears). 50 g 0  . valACYclovir (VALTREX) 1000 MG tablet Take 1 tablet (1,000 mg total) by mouth 3 (three) times daily. 21 tablet 0  . Vitamin D, Ergocalciferol, (DRISDOL) 50000 units CAPS capsule Take 1 capsule (50,000 Units total) by mouth every 7 (seven) days. 4 capsule 0   No current facility-administered medications on file prior to visit.  PAST MEDICAL HISTORY: Past Medical History:  Diagnosis Date  . Anemia    since bypass  . Arthritis    osteoarthritis  . Asthma    states no asthma attack since 2002  . Back pain   . Breast cancer ([place]) 06/26/16 bx   left breast  . Breast cancer ([place])   . Chronic back pain   . Complication of anesthesia    states takes more than normal to put her to sleep  . Constipation   . Dental bridge present    upper  . Dental crowns present   . DVT of upper extremity (deep vein thrombosis) ([place])   . Fibromyalgia   . Genetic testing 09/19/2016   Ms. Halfmann underwent genetic counseling and testing  for hereditary cancer syndromes on 08/21/2016. Her results were negative for mutations in all 46 genes analyzed by [facility]'s 46-gene Common Hereditary Cancers Panel. Genes analyzed include: APC, ATM, AXIN2, BARD1, BMPR1A, BRCA1, BRCA2, BRIP1, CDH1, CDKN2A, CHEK2, CTNNA1, DICER1, EPCAM, GREM1, HOXB13, KIT, MEN1, MLH1, MSH2, MSH3, MSH6, MUTYH, NBN,   . Headache(784.0)    migraines  . History of blood transfusion 06/2005  . History of gallstones   . History of pneumonia   . History of shingles 07/2011  . History of shingles   . HTN (hypertension)   . Itching   . Joint pain   . Muscle weakness   . Neuropathy   . Normal coronary arteries 2003  . Osteoarthropathy   . Personal history of chemotherapy 2018  . Personal history of radiation therapy 2018  . PFO (patent foramen ovale)   . Presence of inferior vena cava filter   . Pulmonary embolism ([place])   . Sleep apnea    used CPAP until after bypass surg.  . Status post gastric bypass for obesity   . Stomach pain   . Stroke [facility]) 12/2002   right-sided weakness  . Urinary incontinence     PAST SURGICAL HISTORY: Past Surgical History:  Procedure Laterality Date  . ABDOMINAL HYSTERECTOMY  11/1997   complete  . ANTERIOR CERVICAL DECOMP/DISCECTOMY FUSION  02/05/2005   C5-6  . APPENDECTOMY  10/22/2008   laparoscopic  . BREAST LUMPECTOMY Left 07/2016  . BREAST LUMPECTOMY WITH RADIOACTIVE SEED AND SENTINEL LYMPH NODE BIOPSY Left 07/26/2016   Procedure: BREAST LUMPECTOMY WITH RADIOACTIVE SEED AND SENTINEL LYMPH NODE BIOPSY;  Surgeon: Erroll Luna, MD;  Location: [facility];  Service: General;  Laterality: Left;  . BUNIONECTOMY  05/1980   both feet  . BUNIONECTOMY  08/2011   left foot  . CARDIAC CATHETERIZATION  03/04/2002  . CARPAL TUNNEL RELEASE  06/21/2009   right  . CARPAL TUNNEL RELEASE     left hand  . CARPAL TUNNEL RELEASE  10/03/2011   Procedure: CARPAL TUNNEL RELEASE;  Surgeon: Wynonia Sours, MD;  Location: [place];  Service: Orthopedics;  Laterality: Right;  CARPAL TUNNEL WITH HYPOTHENAR FAT PAD TRANSFER  . CERVICAL SPINE SURGERY  01/2005   titanium plate implanted  . CHOLECYSTECTOMY  1990  . ELBOW SURGERY  08/09/2004   decompression ulnar nerve right elbow  . ENTEROLYSIS  10/22/2008   laparoscopic abd. enterolysis  . GASTRIC ROUX-EN-Y  2009  . HEEL SPUR SURGERY  08/1997   left  . HEMORRHOID SURGERY  03/1993  . LAPAROSCOPIC LYSIS INTESTINAL ADHESIONS  02/14/2000  . NAILBED REPAIR  01/10/2005; 08/2011   exc. matrix bilat. great toe  . OTHER SURGICAL HISTORY  12/1986  pt states that she had surgery to unclog her fallopean tubes  . PORTACATH PLACEMENT N/A 09/24/2016   Procedure: INSERTION PORT-A-CATH WITH [name];  Surgeon: Erroll Luna, MD;  Location: [facility];  Service: General;  Laterality: N/A;  . SHOULDER SURGERY     bilat. - (left:  06/2005)  . TONSILLECTOMY  07/1995  . TRIGGER FINGER RELEASE  04/25/2006   decompression A-1 pulley left thumb  . UTERINE FIBROID SURGERY  12/95, 7/96   x2  . VENA CAVA FILTER PLACEMENT  2009   during Roux-en-Y surg.    SOCIAL HISTORY: Social History   Tobacco Use  . Smoking status: Former Research scientist (life sciences)  . Smokeless tobacco: Never Used  . Tobacco comment: quit smoking 08/1989  Substance Use Topics  . Alcohol use: No  . Drug use: No    FAMILY HISTORY: Family History  Problem Relation Age of Onset  . Breast cancer Paternal Aunt 23  . Cervical cancer Paternal Grandmother 34       d.40s  . Ovarian cancer Maternal Grandmother 23       d.23  . Colon polyps Father   . Diabetes Father        borderline  . Prostate cancer Father   . High blood pressure Father   . Hypertension Mother   . Hypertension Unknown   . Breast cancer Sister 44       treated with neoadjuvant chemo/radiation and lumpectomy    ROS: Review of Systems  Constitutional: Positive for weight loss.  Gastrointestinal: Positive for constipation. Negative for melena, nausea and vomiting.         Negative for hematochezia  Musculoskeletal:       Negative for muscle weakness  Neurological:       Negative for lightheadedness    PHYSICAL EXAM: Blood pressure 128/75, pulse 60, temperature 98.4 F (36.9 C), temperature source Oral, height _0  (1.575 m), weight 197 lb (89.4 kg), SpO2 99 %. Body mass index is 36.03 kg/m. Physical Exam  Constitutional: She is oriented to person, place, and time. She appears well-developed and well-nourished.  Cardiovascular: Normal rate.  Pulmonary/Chest: Effort normal.  Musculoskeletal: Normal range of motion.  Neurological: She is oriented to person, place, and time.  + ambulates with a cane  Skin: Skin is warm and dry.  Psychiatric: She has a normal mood and affect. Her behavior is normal.  Vitals reviewed.   RECENT LABS AND TESTS: BMET    Component Value Date/Time   NA 136 02/18/2018 1029   NA 138 04/19/2017 1005   K 4.5 02/18/2018 1029   K 4.0 04/19/2017 1005   CL 98 02/18/2018 1029   CO2 23 02/18/2018 1029   CO2 25 04/19/2017 1005   GLUCOSE 98 02/18/2018 1029   GLUCOSE 80 11/26/2017 1544   GLUCOSE 87 04/19/2017 1005   BUN 12 02/18/2018 1029   BUN 12.0 04/19/2017 1005   CREATININE 0.68 02/18/2018 1029   CREATININE 0.77 11/26/2017 1544   CREATININE 0.7 04/19/2017 1005   CALCIUM 9.6 02/18/2018 1029   CALCIUM 8.9 04/19/2017 1005   GFRNONAA 98 02/18/2018 1029   GFRNONAA >60 11/26/2017 1544   GFRAA 113 02/18/2018 1029   GFRAA >60 11/26/2017 1544   Lab Results  Component Value Date   HGBA1C 5.8 (H) 02/18/2018   Lab Results  Component Value Date   INSULIN 4.9 02/18/2018   CBC    Component Value Date/Time   WBC 4.3 11/26/2017 1544   WBC 3.5 (L) 04/19/2017 1005  WBC 7.5 12/14/2016 0120   RBC 3.62 (L) 11/26/2017 1544   HGB 11.5 (L) 11/26/2017 1544   HGB 10.2 (L) 04/19/2017 1005   HCT 34.8 11/26/2017 1544   HCT 31.1 (L) 04/19/2017 1005   PLT 192 11/26/2017 1544   PLT 184 04/19/2017 1005   MCV 96.1 11/26/2017  1544   MCV 97.5 04/19/2017 1005   MCH 31.8 11/26/2017 1544   MCHC 33.0 11/26/2017 1544   RDW 14.5 11/26/2017 1544   RDW 15.3 (H) 04/19/2017 1005   LYMPHSABS 1.3 11/26/2017 1544   LYMPHSABS 1.0 04/19/2017 1005   MONOABS 0.4 11/26/2017 1544   MONOABS 0.3 04/19/2017 1005   EOSABS 0.1 11/26/2017 1544   EOSABS 0.1 04/19/2017 1005   BASOSABS 0.0 11/26/2017 1544   BASOSABS 0.0 04/19/2017 1005   Iron/TIBC/Ferritin/ %Sat No results found for: IRON, TIBC, FERRITIN, IRONPCTSAT Lipid Panel     Component Value Date/Time   CHOL 236 (H) 02/18/2018 1029   TRIG 80 02/18/2018 1029   HDL 88 02/18/2018 1029   CHOLHDL 2.6 09/30/2015 1838   VLDL 12 09/30/2015 1838   LDLCALC 132 (H) 02/18/2018 1029   Hepatic Function Panel     Component Value Date/Time   PROT 6.9 02/18/2018 1029   PROT 6.7 04/19/2017 1005   ALBUMIN 4.5 02/18/2018 1029   ALBUMIN 3.9 04/19/2017 1005   AST 18 02/18/2018 1029   AST 17 11/26/2017 1544   AST 14 04/19/2017 1005   ALT 10 02/18/2018 1029   ALT 14 11/26/2017 1544   ALT 11 04/19/2017 1005   ALKPHOS 81 02/18/2018 1029   ALKPHOS 50 04/19/2017 1005   BILITOT 0.5 02/18/2018 1029   BILITOT 0.4 11/26/2017 1544   BILITOT 0.31 04/19/2017 1005      Component Value Date/Time   TSH 0.802 02/18/2018 1029   Results for LAVENE, PENAGOS (MRN [id number]) as of 04/02/2018 11:39  Ref. Range 02/18/2018 10:29  Vitamin D, 25-Hydroxy Latest Ref Range: 30.0 - 100.0 ng/mL 11.6 (L)   ASSESSMENT AND PLAN: Essential hypertension  Vitamin D deficiency  Other constipation - Plan: polyethylene glycol powder (GLYCOLAX/MIRALAX) powder  Class 2 severe obesity with serious comorbidity and body mass index (BMI) of 36.0 to 36.9 in adult, unspecified obesity type ([place])  PLAN:  Hypertension We discussed sodium restriction, working on healthy weight loss, and a regular exercise program as the means to achieve improved blood pressure control. Natiya agreed with this plan and agreed to  follow up as directed. We will continue to monitor her blood pressure as well as her progress with the above lifestyle modifications. She will continue her medications as prescribed and will watch for signs of hypotension as she continues her lifestyle modifications.  Vitamin D Deficiency [name] was informed that low vitamin D levels contributes to fatigue and are associated with obesity, breast, and colon cancer. She agrees to continue to take prescription Vit D _0 ,000 IU every week and will follow up for routine testing of vitamin D, at least 2-3 times per year. She was informed of the risk of over-replacement of vitamin D and agrees to not increase her dose unless she discusses this with [name] first.  Constipation [name] was informed decrease bowel movement frequency is normal while losing weight, but stools should not be hard or painful. She was advised to increase her H20 intake and work on increasing her fiber intake. High fiber foods were discussed today. [name] agrees to [employer] every other day and prescription was written today for 1  month of  polyethylene glycol powder 17 g daily (portion as needed if possible) with no refills and she agrees to follow up as directed.  Obesity [name] is currently in the action stage of change. As such, her goal is to continue with weight loss efforts She has agreed to follow the Category 2 plan [name] has been instructed to work up to a goal of 150 minutes of combined cardio and strengthening exercise per week for weight loss and overall health benefits. We discussed the following Behavioral Modification Strategies today: increase H2O intake, no skipping meals,  increasing lean protein intake, decreasing simple carbohydrates, increasing vegetables, decrease eating out and work on meal planning and easy cooking plans We discussed some lunch options today.  [name] has agreed to follow up with our clinic in 2 weeks. She was informed of the  importance of frequent follow up visits to maximize her success with intensive lifestyle modifications for her multiple health conditions.   OBESITY BEHAVIORAL INTERVENTION VISIT  Today's visit was # 4   Starting weight: 204 lbs Starting date: 02/18/2018 Today's weight : 197 lbs  Today's date: 04/01/2018 Total lbs lost to date: 7 At least 15 minutes were spent on discussing the following behavioral intervention visit.   ASK: We discussed the diagnosis of obesity with [name] today and Adelynne agreed to give [name] permission to discuss obesity behavioral modification therapy today.  ASSESS: [name] has the diagnosis of obesity and her BMI today is 36.02 [name] is in the action stage of change   ADVISE: Syan was educated on the multiple health risks of obesity as well as the benefit of weight loss to improve her health. She was advised of the need for long term treatment and the importance of lifestyle modifications to improve her current health and to decrease her risk of future health problems.  AGREE: Multiple dietary modification options and treatment options were discussed and  [name] agreed to follow the recommendations documented in the above note.  ARRANGE: Lourdez was educated on the importance of frequent visits to treat obesity as outlined per CMS and USPSTF guidelines and agreed to schedule her next follow up appointment today.  Corey Skains, am acting as Location manager for [employer]. Owens Shark, DO  I have reviewed the above documentation for accuracy and completeness, and I agree with the above. -Jearld Lesch, DO

## 2018-04-09 DIAGNOSIS — I10 Essential (primary) hypertension: Secondary | ICD-10-CM | POA: Diagnosis not present

## 2018-04-09 DIAGNOSIS — J4520 Mild intermittent asthma, uncomplicated: Secondary | ICD-10-CM | POA: Diagnosis not present

## 2018-04-09 DIAGNOSIS — N3281 Overactive bladder: Secondary | ICD-10-CM | POA: Diagnosis not present

## 2018-04-09 DIAGNOSIS — D649 Anemia, unspecified: Secondary | ICD-10-CM | POA: Diagnosis not present

## 2018-04-15 ENCOUNTER — Encounter (INDEPENDENT_AMBULATORY_CARE_PROVIDER_SITE_OTHER): Payer: Self-pay | Admitting: Bariatrics

## 2018-04-16 MED FILL — SOLIFENACIN SUCCINATE 10 MG: 10 | 30 days supply | Qty: 30 | Fill #0

## 2018-04-21 ENCOUNTER — Encounter (INDEPENDENT_AMBULATORY_CARE_PROVIDER_SITE_OTHER): Payer: Self-pay

## 2018-04-21 ENCOUNTER — Ambulatory Visit (INDEPENDENT_AMBULATORY_CARE_PROVIDER_SITE_OTHER): Payer: Medicare Other | Admitting: Bariatrics

## 2018-04-22 ENCOUNTER — Encounter (INDEPENDENT_AMBULATORY_CARE_PROVIDER_SITE_OTHER): Payer: Self-pay | Admitting: Dietician

## 2018-04-22 ENCOUNTER — Ambulatory Visit (INDEPENDENT_AMBULATORY_CARE_PROVIDER_SITE_OTHER): Payer: Medicare Other | Admitting: Dietician

## 2018-04-22 VITALS — Ht 62.0 in | Wt 199.0 lb

## 2018-04-22 DIAGNOSIS — Z6837 Body mass index (BMI) 37.0-37.9, adult: Secondary | ICD-10-CM

## 2018-04-22 DIAGNOSIS — Z9189 Other specified personal risk factors, not elsewhere classified: Secondary | ICD-10-CM

## 2018-04-22 DIAGNOSIS — R7303 Prediabetes: Secondary | ICD-10-CM

## 2018-04-23 ENCOUNTER — Ambulatory Visit (INDEPENDENT_AMBULATORY_CARE_PROVIDER_SITE_OTHER): Payer: Medicare Other | Admitting: Dietician

## 2018-04-23 NOTE — Progress Notes (Signed)
  Office: [phone]  /  Fax: [phone]     [name] has a diagnosis of prediabetes based on her elevated HgA1c and was informed this puts her at greater risk of developing diabetes. She is here today for diabetes risk nutrition counseling.   [name]'s weight today is 199 lbs, a 2 lb weight gain since her last visit. She has had a weight loss of 5 lbs since beginning treatment with [name]. She states she followed the meal plan 90% even through the Thanksgiving holiday and is discouraged at her progress. Per her diet recall [name] is following her meal plan closely and her only trouble is getting all the protein in at her dinner meal. Her meal plan was customized to assure she is able to get all her protein in during the day. [name] is s/p gastric bypass 2009.  Reviewed  nutrients ie protein, fats, simple and complex carbohydrates and how these affect insulin response, prediabetes and her weight. . Focus on portion control,  avoiding simple carbohydrates and increasing lean proteins for ongoing weight loss.  [name] is on the following meal plan: category 2 with 1/2 cup cottage cheese.  Her meal plan was individualized for maximum benefit.  Also discussed at length the following behavioral modifications to help maximize success : increasing lean protein intake, meal planning and cooking strategies. We will repeat her indirect calorimetry at her next visit with Dr. Owens Shark.    [name] has been instructed to work up to a goal of 150 minutes of combined cardio and strengthening exercise per week for weight loss and overall health benefits. She is currently walking for 30 minutes 3 days per week. She was provided an exercise band today with a written plan of some easy strengthening exercises.    Office: [phone]  /  Fax: [phone]  OBESITY BEHAVIORAL INTERVENTION VISIT  Today's visit was # 5   Starting weight: 204 lbs Starting date: 02/18/18 Today's weight : Weight: 199 lb (90.3 kg)    Today's date: 04/22/18 Total lbs lost to date: 5 lbs At least 15 minutes were spent on discussing the following behavioral intervention visit.   ASK: We discussed the diagnosis of obesity with [name] today and [name] agreed to give [name] permission to discuss obesity behavioral modification therapy today.  ASSESS: [name] has the diagnosis of obesity and her BMI today is 73 [name] is in the action stage of change   ADVISE: [name] was educated on the multiple health risks of obesity as well as the benefit of weight loss to improve her health. She was advised of the need for long term treatment and the importance of lifestyle modifications to improve her current health and to decrease her risk of future health problems.  AGREE: Multiple dietary modification options and treatment options were discussed and  [name] agreed to follow the recommendations documented in the above note.  ARRANGE: [name] was educated on the importance of frequent visits to treat obesity as outlined per CMS and USPSTF guidelines and agreed to schedule her next follow up appointment today.

## 2018-04-30 ENCOUNTER — Other Ambulatory Visit (INDEPENDENT_AMBULATORY_CARE_PROVIDER_SITE_OTHER): Payer: Self-pay | Admitting: Bariatrics

## 2018-04-30 DIAGNOSIS — E559 Vitamin D deficiency, unspecified: Secondary | ICD-10-CM

## 2018-05-06 ENCOUNTER — Ambulatory Visit (INDEPENDENT_AMBULATORY_CARE_PROVIDER_SITE_OTHER): Payer: Medicare Other | Admitting: Bariatrics

## 2018-05-09 ENCOUNTER — Other Ambulatory Visit: Payer: Self-pay | Admitting: Nurse Practitioner

## 2018-05-13 MED FILL — SOLIFENACIN SUCCINATE 10 MG: 10 | 30 days supply | Qty: 30 | Fill #1

## 2018-05-13 MED FILL — VIT D2 1.25 MG (50,000 UNIT: 1.25 MG | 14 days supply | Qty: 2 | Fill #0

## 2018-05-13 MED FILL — LETROZOLE 2.5 MG TABLET: 2.5 | 90 days supply | Qty: 90 | Fill #2

## 2018-05-15 MED FILL — SSD 1% CREAM: 1 | 30 days supply | Qty: 400 | Fill #0

## 2018-05-16 MED FILL — VALACYCLOVIR HCL 500 MG TAB: 500 | 90 days supply | Qty: 180 | Fill #0

## 2018-05-20 DIAGNOSIS — L299 Pruritus, unspecified: Secondary | ICD-10-CM | POA: Diagnosis not present

## 2018-05-20 DIAGNOSIS — N3281 Overactive bladder: Secondary | ICD-10-CM | POA: Diagnosis not present

## 2018-05-26 ENCOUNTER — Encounter (INDEPENDENT_AMBULATORY_CARE_PROVIDER_SITE_OTHER): Payer: Self-pay | Admitting: Bariatrics

## 2018-05-26 ENCOUNTER — Ambulatory Visit (INDEPENDENT_AMBULATORY_CARE_PROVIDER_SITE_OTHER): Payer: Medicare Other | Admitting: Bariatrics

## 2018-05-26 VITALS — BP 126/79 | HR 55 | Temp 97.6°F | Ht 62.0 in | Wt 197.0 lb

## 2018-05-26 DIAGNOSIS — R5383 Other fatigue: Secondary | ICD-10-CM

## 2018-05-26 DIAGNOSIS — E559 Vitamin D deficiency, unspecified: Secondary | ICD-10-CM | POA: Diagnosis not present

## 2018-05-26 DIAGNOSIS — Z6836 Body mass index (BMI) 36.0-36.9, adult: Secondary | ICD-10-CM

## 2018-05-26 DIAGNOSIS — I10 Essential (primary) hypertension: Secondary | ICD-10-CM | POA: Diagnosis not present

## 2018-05-26 DIAGNOSIS — R0602 Shortness of breath: Secondary | ICD-10-CM | POA: Diagnosis not present

## 2018-05-26 MED ORDER — VITAMIN D (ERGOCALCIFEROL) 1.25 MG (50000 UNIT) PO CAPS: ORAL_CAPSULE | ORAL | 0 refills | Status: DC

## 2018-05-27 ENCOUNTER — Telehealth: Payer: Self-pay | Admitting: Hematology and Oncology

## 2018-05-27 ENCOUNTER — Inpatient Hospital Stay: Payer: Medicare Other | Admitting: Hematology and Oncology

## 2018-05-27 NOTE — Telephone Encounter (Signed)
Called patient per 1/6 sch message to r/s - unable to reach patient - left message for patient to call back to r/s

## 2018-05-27 NOTE — Assessment & Plan Note (Signed)
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 Adjuvant chemotherapy with Adriamycin/Cytoxan x 4, then weekly Taxol x 12 completed 04/19/17 2. Adjuvant radiation therapycompleted on 07/26/2017 3. 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 Breast cancer surveillance: 1.  Breast exam: 05/27/2018: Benign 2.  Mammogram 03/06/2018: Benign, breast density category C  Return to clinic in 1 year for follow-up

## 2018-05-27 NOTE — Telephone Encounter (Signed)
R/s appt per 1/7 sch message - pt is aware of appt date and time   

## 2018-05-27 NOTE — Progress Notes (Signed)
Office: [phone]  /  Fax: [phone]   HPI:   Chief Complaint: OBESITY [name] is here to discuss her progress with her obesity treatment plan. She is on the Category 2 plan and is following her eating plan approximately 50 % of the time. She states she is walking 30 minutes 3 times per week. [name] is doing well overall. Her initial indirect calorimetry was at 1475 (02/18/18) and has increased to 1849 (05/26/18). 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 Her weight is 197 lb (89.4 kg) today and has had a weight loss of 2 pounds over a period of 3 weeks since her last visit. She has lost 7 lbs since starting treatment with [name].  Vitamin D deficiency [name] has a diagnosis of vitamin D deficiency. She is currently taking vit D and her last vitamin D level was at 11.6 She denies nausea, vomiting or muscle weakness.  Hypertension [name] is a 58 y.o. female with hypertension. She is currently taking lisinopril. DONYE DAUENHAUER denies chest pain or shortness of breath on exertion. She is working weight loss to help control her blood pressure with the goal of decreasing her risk of heart attack and stroke. [name] blood pressure is well controlled.   ASSESSMENT AND PLAN:  Vitamin D deficiency - Plan: Vitamin D, Ergocalciferol, (DRISDOL) 1.25 MG (50000 UT) CAPS capsule  Essential hypertension  Other fatigue  Shortness of breath on exertion  Class 2 severe obesity with serious comorbidity and body mass index (BMI) of 36.0 to 36.9 in adult, unspecified obesity type ([place])  PLAN:  Vitamin D Deficiency [name] was informed that low vitamin D levels contributes to fatigue and are associated with obesity, breast, and colon cancer. She agrees to continue to take prescription Vit D _0 ,000 IU every week #4 with no refills and will follow up for routine testing of vitamin D, at least 2-3 times per year. She was informed of the risk of over-replacement of  vitamin D and agrees to not increase her dose unless she discusses this with [name] first. [name] agrees to follow up with our clinic in 2 weeks.  Hypertension We discussed sodium restriction, working on healthy weight loss, and a regular exercise program as the means to achieve improved blood pressure control. [name] agreed with this plan and agreed to follow up as directed. We will continue to monitor her blood pressure as well as her progress with the above lifestyle modifications. She will continue her medications as prescribed and will watch for signs of hypotension as she continues her lifestyle modifications.   Obesity Lacora is currently in the action stage of change. As such, her goal is to continue with weight loss efforts She has agreed to follow the Category 2 plan +200 calories [name] has been instructed to work up to a goal of 150 minutes of combined cardio and strengthening exercise per week for weight loss and overall health benefits. We discussed the following Behavioral Modification Strategies today: increase H2O intake, increasing lean protein intake, decreasing simple carbohydrates, increasing vegetables and work on meal planning and easy cooking plans Recipes and 100 calorie snack handouts were provided to patient today.  [name] has agreed to follow up with our clinic in 2 weeks fasting. She was informed of the importance of frequent follow up visits to maximize her success with intensive lifestyle modifications for her multiple health conditions.  ALLERGIES: Allergies  Allergen Reactions   Aspirin Other (See Comments)  ESOPHAGITIS   Oxycodone Hcl Diarrhea and Nausea And Vomiting   Propoxyphene N-Acetaminophen Diarrhea and Nausea And Vomiting   Tramadol Other (See Comments)    Cause migraines   Adhesive [Tape] Rash and Other (See Comments)    Pulls skin off - please use paper tape   Prednisone Rash    MEDICATIONS: Current Outpatient Medications on  File Prior to Visit  Medication Sig Dispense Refill   albuterol (PROAIR HFA) 108 (90 Base) MCG/ACT inhaler Inhale 2 puffs into the lungs every 6 (six) hours as needed for wheezing or shortness of breath.     aspirin EC 81 MG tablet Take 81 mg by mouth daily.     Calcium Citrate-Vitamin D (CALCIUM CITRATE +D PO) Take 2 tablets by mouth daily. Calcium 600 mg      diphenhydrAMINE-APAP, sleep, (TYLENOL PM EXTRA STRENGTH) 50-1000 MG/30ML LIQD Take by mouth.     letrozole (FEMARA) 2.5 MG tablet Take 1 tablet (2.5 mg total) by mouth daily. 90 tablet 3   lisinopril (PRINIVIL,ZESTRIL) 20 MG tablet Take 20 mg by mouth daily.     mometasone (ELOCON) 0.1 % cream Apply 1 application topically daily as needed (rash - summer eczema). Apply to arms     Multiple Vitamin (MULTIVITAMIN WITH MINERALS) TABS tablet Take 1 tablet by mouth at bedtime.     orphenadrine (NORFLEX) 100 MG tablet Take 100 mg by mouth 2 (two) times daily.     oxybutynin (DITROPAN) 5 MG tablet TAKE 1 TABLET BY MOUTH 3 TIMES DAILY 270 tablet 0   polyethylene glycol powder (GLYCOLAX/MIRALAX) powder Take 17 g by mouth daily. 3350 g 0   ranitidine (ZANTAC) 300 MG capsule Take 1 capsule (300 mg total) by mouth 2 (two) times daily. 60 capsule 3   silver sulfADIAZINE (SILVADENE) 1 % cream Apply 1 application topically 2 (two) times daily as needed (stomach tears). 50 g 0   valACYclovir (VALTREX) 1000 MG tablet Take 1 tablet (1,000 mg total) by mouth 3 (three) times daily. 21 tablet 0   No current facility-administered medications on file prior to visit.     PAST MEDICAL HISTORY: Past Medical History:  Diagnosis Date   Anemia    since bypass   Arthritis    osteoarthritis   Asthma    states no asthma attack since 2002   Back pain    Breast cancer ([place]) 06/26/16 bx   left breast   Breast cancer ([place])    Chronic back pain    Complication of anesthesia    states takes more than normal to put her to sleep   Constipation     Dental bridge present    upper   Dental crowns present    DVT of upper extremity (deep vein thrombosis) ([place])    Fibromyalgia    Genetic testing 09/19/2016   [name] underwent genetic counseling and testing for hereditary cancer syndromes on 08/21/2016. Her results were negative for mutations in all 46 genes analyzed by [facility]'s 46-gene Common Hereditary Cancers Panel. Genes analyzed include: APC, ATM, AXIN2, BARD1, BMPR1A, BRCA1, BRCA2, BRIP1, CDH1, CDKN2A, CHEK2, CTNNA1, DICER1, EPCAM, GREM1, HOXB13, KIT, MEN1, MLH1, MSH2, MSH3, MSH6, MUTYH, NBN,    Headache(784.0)    migraines   History of blood transfusion 06/2005   History of gallstones    History of pneumonia    History of shingles 07/2011   History of shingles    HTN (hypertension)    Itching    Joint pain  Muscle weakness    Neuropathy    Normal coronary arteries 2003   Osteoarthropathy    Personal history of chemotherapy 2018   Personal history of radiation therapy 2018   PFO (patent foramen ovale)    Presence of inferior vena cava filter    Pulmonary embolism (HCC)    Sleep apnea    used CPAP until after bypass surg.   Status post gastric bypass for obesity    Stomach pain    Stroke [facility]) 12/2002   right-sided weakness   Urinary incontinence     PAST SURGICAL HISTORY: Past Surgical History:  Procedure Laterality Date   ABDOMINAL HYSTERECTOMY  11/1997   complete   ANTERIOR CERVICAL DECOMP/DISCECTOMY FUSION  02/05/2005   C5-6   APPENDECTOMY  10/22/2008   laparoscopic   BREAST LUMPECTOMY Left 07/2016   BREAST LUMPECTOMY WITH RADIOACTIVE SEED AND SENTINEL LYMPH NODE BIOPSY Left 07/26/2016   Procedure: BREAST LUMPECTOMY WITH RADIOACTIVE SEED AND SENTINEL LYMPH NODE BIOPSY;  Surgeon: Erroll Luna, MD;  Location: [place];  Service: General;  Laterality: Left;   BUNIONECTOMY  05/1980   both feet   BUNIONECTOMY  08/2011   left foot   CARDIAC CATHETERIZATION   03/04/2002   CARPAL TUNNEL RELEASE  06/21/2009   right   CARPAL TUNNEL RELEASE     left hand   CARPAL TUNNEL RELEASE  10/03/2011   Procedure: CARPAL TUNNEL RELEASE;  Surgeon: Wynonia Sours, MD;  Location: [place];  Service: Orthopedics;  Laterality: Right;  CARPAL TUNNEL WITH HYPOTHENAR FAT PAD TRANSFER   CERVICAL SPINE SURGERY  01/2005   titanium plate implanted   CHOLECYSTECTOMY  1990   ELBOW SURGERY  08/09/2004   decompression ulnar nerve right elbow   ENTEROLYSIS  10/22/2008   laparoscopic abd. enterolysis   GASTRIC ROUX-EN-Y  2009   HEEL SPUR SURGERY  08/1997   left   HEMORRHOID SURGERY  03/1993   LAPAROSCOPIC LYSIS INTESTINAL ADHESIONS  02/14/2000   NAILBED REPAIR  01/10/2005; 08/2011   exc. matrix bilat. great toe   OTHER SURGICAL HISTORY  12/1986   pt states that she had surgery to unclog her fallopean tubes   PORTACATH PLACEMENT N/A 09/24/2016   Procedure: INSERTION PORT-A-CATH WITH [name];  Surgeon: Erroll Luna, MD;  Location: [place];  Service: General;  Laterality: N/A;   SHOULDER SURGERY     bilat. - (left:  06/2005)   TONSILLECTOMY  07/1995   TRIGGER FINGER RELEASE  04/25/2006   decompression A-1 pulley left thumb   UTERINE FIBROID SURGERY  12/95, 7/96   x2   VENA CAVA FILTER PLACEMENT  2009   during Roux-en-Y surg.    SOCIAL HISTORY: Social History   Tobacco Use   Smoking status: Former Smoker   Smokeless tobacco: Never Used   Tobacco comment: quit smoking 08/1989  Substance Use Topics   Alcohol use: No   Drug use: No    FAMILY HISTORY: Family History  Problem Relation Age of Onset   Breast cancer Paternal Aunt 72   Cervical cancer Paternal Grandmother 88       d.47s   Ovarian cancer Maternal Grandmother 23       d.23   Colon polyps Father    Diabetes Father        borderline   Prostate cancer Father    High blood pressure Father    Hypertension Mother    Hypertension Unknown    Breast cancer Sister 58  treated with neoadjuvant chemo/radiation and lumpectomy    ROS: Review of Systems  Constitutional: Positive for weight loss.  Respiratory: Negative for shortness of breath (on exertion).   Cardiovascular: Negative for chest pain.  Gastrointestinal: Negative for nausea and vomiting.  Musculoskeletal:       Negative for muscle weakness    PHYSICAL EXAM: Pulse (!) 55, temperature 97.6 F (36.4 C), temperature source Oral, height 5' 2" (1.575 m), weight 197 lb (89.4 kg), SpO2 99 %. Body mass index is 36.03 kg/m. Physical Exam Vitals signs reviewed.  Constitutional:      Appearance: Normal appearance. She is well-developed. She is obese.  Cardiovascular:     Rate and Rhythm: Normal rate.  Pulmonary:     Effort: Pulmonary effort is normal.  Musculoskeletal: Normal range of motion.  Skin:    General: Skin is warm and dry.  Neurological:     Mental Status: She is alert and oriented to person, place, and time.  Psychiatric:        Mood and Affect: Mood normal.        Behavior: Behavior normal.     RECENT LABS AND TESTS: BMET    Component Value Date/Time   NA 136 02/18/2018 1029   NA 138 04/19/2017 1005   K 4.5 02/18/2018 1029   K 4.0 04/19/2017 1005   CL 98 02/18/2018 1029   CO2 23 02/18/2018 1029   CO2 25 04/19/2017 1005   GLUCOSE 98 02/18/2018 1029   GLUCOSE 80 11/26/2017 1544   GLUCOSE 87 04/19/2017 1005   BUN 12 02/18/2018 1029   BUN 12.0 04/19/2017 1005   CREATININE 0.68 02/18/2018 1029   CREATININE 0.77 11/26/2017 1544   CREATININE 0.7 04/19/2017 1005   CALCIUM 9.6 02/18/2018 1029   CALCIUM 8.9 04/19/2017 1005   GFRNONAA 98 02/18/2018 1029   GFRNONAA >60 11/26/2017 1544   GFRAA 113 02/18/2018 1029   GFRAA >60 11/26/2017 1544   Lab Results  Component Value Date   HGBA1C 5.8 (H) 02/18/2018   Lab Results  Component Value Date   INSULIN 4.9 02/18/2018   CBC    Component Value Date/Time   WBC 4.3 11/26/2017 1544   WBC 3.5 (L) 04/19/2017 1005   WBC  7.5 12/14/2016 0120   RBC 3.62 (L) 11/26/2017 1544   HGB 11.5 (L) 11/26/2017 1544   HGB 10.2 (L) 04/19/2017 1005   HCT 34.8 11/26/2017 1544   HCT 31.1 (L) 04/19/2017 1005   PLT 192 11/26/2017 1544   PLT 184 04/19/2017 1005   MCV 96.1 11/26/2017 1544   MCV 97.5 04/19/2017 1005   MCH 31.8 11/26/2017 1544   MCHC 33.0 11/26/2017 1544   RDW 14.5 11/26/2017 1544   RDW 15.3 (H) 04/19/2017 1005   LYMPHSABS 1.3 11/26/2017 1544   LYMPHSABS 1.0 04/19/2017 1005   MONOABS 0.4 11/26/2017 1544   MONOABS 0.3 04/19/2017 1005   EOSABS 0.1 11/26/2017 1544   EOSABS 0.1 04/19/2017 1005   BASOSABS 0.0 11/26/2017 1544   BASOSABS 0.0 04/19/2017 1005   Iron/TIBC/Ferritin/ %Sat No results found for: IRON, TIBC, FERRITIN, IRONPCTSAT Lipid Panel     Component Value Date/Time   CHOL 236 (H) 02/18/2018 1029   TRIG 80 02/18/2018 1029   HDL 88 02/18/2018 1029   CHOLHDL 2.6 09/30/2015 1838   VLDL 12 09/30/2015 1838   LDLCALC 132 (H) 02/18/2018 1029   Hepatic Function Panel     Component Value Date/Time   PROT 6.9 02/18/2018 1029   PROT 6.7  04/19/2017 1005   ALBUMIN 4.5 02/18/2018 1029   ALBUMIN 3.9 04/19/2017 1005   AST 18 02/18/2018 1029   AST 17 11/26/2017 1544   AST 14 04/19/2017 1005   ALT 10 02/18/2018 1029   ALT 14 11/26/2017 1544   ALT 11 04/19/2017 1005   ALKPHOS 81 02/18/2018 1029   ALKPHOS 50 04/19/2017 1005   BILITOT 0.5 02/18/2018 1029   BILITOT 0.4 11/26/2017 1544   BILITOT 0.31 04/19/2017 1005      Component Value Date/Time   TSH 0.802 02/18/2018 1029     Ref. Range 02/18/2018 10:29  Vitamin D, 25-Hydroxy Latest Ref Range: 30.0 - 100.0 ng/mL 11.6 (L)     OBESITY BEHAVIORAL INTERVENTION VISIT  Today's visit was # 5   Starting weight: 204 lbs Starting date: 02/18/2018 Today's weight : 197 lbs  Today's date: 05/26/2018 Total lbs lost to date: 7 At least 15 minutes were spent on discussing the following behavioral intervention visit.   ASK: We discussed the diagnosis  of obesity with [name] today and Laia agreed to give [name] permission to discuss obesity behavioral modification therapy today.  ASSESS: Emelina has the diagnosis of obesity and her BMI today is 36.02 Courtnee is in the action stage of change   ADVISE: Harpreet was educated on the multiple health risks of obesity as well as the benefit of weight loss to improve her health. She was advised of the need for long term treatment and the importance of lifestyle modifications to improve her current health and to decrease her risk of future health problems.  AGREE: Multiple dietary modification options and treatment options were discussed and  Hend agreed to follow the recommendations documented in the above note.  ARRANGE: Nakeesha was educated on the importance of frequent visits to treat obesity as outlined per CMS and USPSTF guidelines and agreed to schedule her next follow up appointment today.  Corey Skains, am acting as Location manager for [employer]. Owens Shark, DO  I have reviewed the above documentation for accuracy and completeness, and I agree with the above. -Jearld Lesch, DO

## 2018-06-06 MED FILL — VIT D2 1.25 MG (50,000 UNIT: 1.25 MG | 28 days supply | Qty: 4 | Fill #0

## 2018-06-10 ENCOUNTER — Encounter (INDEPENDENT_AMBULATORY_CARE_PROVIDER_SITE_OTHER): Payer: Self-pay | Admitting: Bariatrics

## 2018-06-10 ENCOUNTER — Ambulatory Visit (INDEPENDENT_AMBULATORY_CARE_PROVIDER_SITE_OTHER): Payer: Medicare Other | Admitting: Bariatrics

## 2018-06-10 VITALS — BP 124/77 | HR 62 | Temp 98.3°F | Ht 62.0 in | Wt 195.0 lb

## 2018-06-10 DIAGNOSIS — Z6835 Body mass index (BMI) 35.0-35.9, adult: Secondary | ICD-10-CM | POA: Diagnosis not present

## 2018-06-10 DIAGNOSIS — E559 Vitamin D deficiency, unspecified: Secondary | ICD-10-CM

## 2018-06-10 DIAGNOSIS — I10 Essential (primary) hypertension: Secondary | ICD-10-CM | POA: Diagnosis not present

## 2018-06-10 NOTE — Progress Notes (Signed)
Office: [phone]  /  Fax: [phone]   HPI:   Chief Complaint: OBESITY Buford is here to discuss her progress with her obesity treatment plan. She is on the Category 2 plan + 200 calories and is following her eating plan approximately 70 % of the time. She states she is walking 30 minutes 3 times per week. Miel is doing well. She is still under a lot of stress with her mother in the hospital for 8 days.  Her weight is 195 lb (88.5 kg) today and has had a weight loss of 2 pounds over a period of 2 weeks since her last visit. She has lost 9 lbs since starting treatment with [name].  Vitamin D deficiency Verbie has a diagnosis of vitamin D deficiency. She is currently taking high dose vit D.  Hypertension JACLIN FINKS is a 58 y.o. female with hypertension. Valynn's blood pressure is currently well controlled and she is taking lisinopril 53m. She is working on weight loss to help control her blood pressure with the goal of decreasing her risk of heart attack and stroke.   ASSESSMENT AND PLAN:  Vitamin D deficiency - Plan: VITAMIN D 25 Hydroxy (Vit-D Deficiency, Fractures)  Essential hypertension - Plan: Comprehensive metabolic panel, Hemoglobin A1c  Class 2 severe obesity with serious comorbidity and body mass index (BMI) of 35.0 to 35.9 in adult, unspecified obesity type ([place]  PLAN:  Vitamin D Deficiency MEllasonwas informed that low vitamin D levels contributes to fatigue and are associated with obesity, breast, and colon cancer. She agrees to continue to take prescription Vit D _0 ,000 IU every week and will follow up for routine testing of vitamin D, at least 2-3 times per year. She was informed of the risk of over-replacement of vitamin D and agrees to not increase her dose unless she discusses this with [place]. MRainagrees to follow up in 2 weeks.  Hypertension We discussed sodium restriction, working on healthy weight loss, and a regular exercise program  as the means to achieve improved blood pressure control. We will continue to monitor her blood pressure as well as her progress with the above lifestyle modifications. She will continue her medications as prescribed and will watch for signs of hypotension as she continues her lifestyle modifications. MMariyanaagreed with this plan and agreed to follow up as directed.  Obesity MNygeriais currently in the action stage of change. As such, her goal is to continue with weight loss efforts. She has agreed to follow the Category 2 plan + 200 calories. MAdeliaagrees to stay with the plan and increase water, meal planning, and walking. MLaytoyahas been instructed to work up to a goal of 150 minutes of combined cardio and strengthening exercise per week for weight loss and overall health benefits. We discussed the following Behavioral Modification Strategies today: increasing lean protein intake, decreasing simple carbohydrates, increasing vegetables, increase H2O intake, keeping healthy foods in the home, and work on meal planning and easy cooking plans.  MKelvinhas agreed to follow up with our clinic in 2 weeks. She was informed of the importance of frequent follow up visits to maximize her success with intensive lifestyle modifications for her multiple health conditions.  ALLERGIES: Allergies  Allergen Reactions  . Aspirin Other (See Comments)    ESOPHAGITIS  . Oxycodone Hcl Diarrhea and Nausea And Vomiting  . Propoxyphene N-Acetaminophen Diarrhea and Nausea And Vomiting  . Tramadol Other (See Comments)    Cause migraines  . Adhesive [  Tape] Rash and Other (See Comments)    Pulls skin off - please use paper tape  . Prednisone Rash    MEDICATIONS: Current Outpatient Medications on File Prior to Visit  Medication Sig Dispense Refill  . albuterol (PROAIR HFA) 108 (90 Base) MCG/ACT inhaler Inhale 2 puffs into the lungs every 6 (six) hours as needed for wheezing or shortness of breath.    Marland Kitchen  aspirin EC 81 MG tablet Take 81 mg by mouth daily.    . Calcium Citrate-Vitamin D (CALCIUM CITRATE +D PO) Take 2 tablets by mouth daily. Calcium 600 mg     . diphenhydrAMINE-APAP, sleep, (TYLENOL PM EXTRA STRENGTH) 50-1000 MG/30ML LIQD Take by mouth.    . letrozole (FEMARA) 2.5 MG tablet Take 1 tablet (2.5 mg total) by mouth daily. 90 tablet 3  . lisinopril (PRINIVIL,ZESTRIL) 20 MG tablet Take 20 mg by mouth daily.    . mometasone (ELOCON) 0.1 % cream Apply 1 application topically daily as needed (rash - summer eczema). Apply to arms    . Multiple Vitamin (MULTIVITAMIN WITH MINERALS) TABS tablet Take 1 tablet by mouth at bedtime.    . orphenadrine (NORFLEX) 100 MG tablet Take 100 mg by mouth 2 (two) times daily.    Marland Kitchen oxybutynin (DITROPAN) 5 MG tablet TAKE 1 TABLET BY MOUTH 3 TIMES DAILY 270 tablet 0  . polyethylene glycol powder (GLYCOLAX/MIRALAX) powder Take 17 g by mouth daily. 3350 g 0  . ranitidine (ZANTAC) 300 MG capsule Take 1 capsule (300 mg total) by mouth 2 (two) times daily. 60 capsule 3  . silver sulfADIAZINE (SILVADENE) 1 % cream Apply 1 application topically 2 (two) times daily as needed (stomach tears). 50 g 0  . valACYclovir (VALTREX) 1000 MG tablet Take 1 tablet (1,000 mg total) by mouth 3 (three) times daily. 21 tablet 0  . Vitamin D, Ergocalciferol, (DRISDOL) 1.25 MG (50000 UT) CAPS capsule TAKE 1 CAPSULE BY MOUTH BY MOUTH EVERY 7 DAYS 4 capsule 0   No current facility-administered medications on file prior to visit.     PAST MEDICAL HISTORY: Past Medical History:  Diagnosis Date  . Anemia    since bypass  . Arthritis    osteoarthritis  . Asthma    states no asthma attack since 2002  . Back pain   . Breast cancer ([place]) 06/26/16 bx   left breast  . Breast cancer ([place])   . Chronic back pain   . Complication of anesthesia    states takes more than normal to put her to sleep  . Constipation   . Dental bridge present    upper  . Dental crowns present   . DVT of upper  extremity (deep vein thrombosis) ([place])   . Fibromyalgia   . Genetic testing 09/19/2016   Ms. Dodge underwent genetic counseling and testing for hereditary cancer syndromes on 08/21/2016. Her results were negative for mutations in all 46 genes analyzed by [facility]'s 46-gene Common Hereditary Cancers Panel. Genes analyzed include: APC, ATM, AXIN2, BARD1, BMPR1A, BRCA1, BRCA2, BRIP1, CDH1, CDKN2A, CHEK2, CTNNA1, DICER1, EPCAM, GREM1, HOXB13, KIT, MEN1, MLH1, MSH2, MSH3, MSH6, MUTYH, NBN,   . Headache(784.0)    migraines  . History of blood transfusion 06/2005  . History of gallstones   . History of pneumonia   . History of shingles 07/2011  . History of shingles   . HTN (hypertension)   . Itching   . Joint pain   . Muscle weakness   . Neuropathy   .  Normal coronary arteries 2003  . Osteoarthropathy   . Personal history of chemotherapy 2018  . Personal history of radiation therapy 2018  . PFO (patent foramen ovale)   . Presence of inferior vena cava filter   . Pulmonary embolism ([place])   . Sleep apnea    used CPAP until after bypass surg.  . Status post gastric bypass for obesity   . Stomach pain   . Stroke [facility]) 12/2002   right-sided weakness  . Urinary incontinence     PAST SURGICAL HISTORY: Past Surgical History:  Procedure Laterality Date  . ABDOMINAL HYSTERECTOMY  11/1997   complete  . ANTERIOR CERVICAL DECOMP/DISCECTOMY FUSION  02/05/2005   C5-6  . APPENDECTOMY  10/22/2008   laparoscopic  . BREAST LUMPECTOMY Left 07/2016  . BREAST LUMPECTOMY WITH RADIOACTIVE SEED AND SENTINEL LYMPH NODE BIOPSY Left 07/26/2016   Procedure: BREAST LUMPECTOMY WITH RADIOACTIVE SEED AND SENTINEL LYMPH NODE BIOPSY;  Surgeon: Erroll Luna, MD;  Location: [facility];  Service: General;  Laterality: Left;  . BUNIONECTOMY  05/1980   both feet  . BUNIONECTOMY  08/2011   left foot  . CARDIAC CATHETERIZATION  03/04/2002  . CARPAL TUNNEL RELEASE  06/21/2009   right  . CARPAL TUNNEL RELEASE      left hand  . CARPAL TUNNEL RELEASE  10/03/2011   Procedure: CARPAL TUNNEL RELEASE;  Surgeon: Wynonia Sours, MD;  Location: [place];  Service: Orthopedics;  Laterality: Right;  CARPAL TUNNEL WITH HYPOTHENAR FAT PAD TRANSFER  . CERVICAL SPINE SURGERY  01/2005   titanium plate implanted  . CHOLECYSTECTOMY  1990  . ELBOW SURGERY  08/09/2004   decompression ulnar nerve right elbow  . ENTEROLYSIS  10/22/2008   laparoscopic abd. enterolysis  . GASTRIC ROUX-EN-Y  2009  . HEEL SPUR SURGERY  08/1997   left  . HEMORRHOID SURGERY  03/1993  . LAPAROSCOPIC LYSIS INTESTINAL ADHESIONS  02/14/2000  . NAILBED REPAIR  01/10/2005; 08/2011   exc. matrix bilat. great toe  . OTHER SURGICAL HISTORY  12/1986   pt states that she had surgery to unclog her fallopean tubes  . PORTACATH PLACEMENT N/A 09/24/2016   Procedure: INSERTION PORT-A-CATH WITH [name];  Surgeon: Erroll Luna, MD;  Location: [facility];  Service: General;  Laterality: N/A;  . SHOULDER SURGERY     bilat. - (left:  06/2005)  . TONSILLECTOMY  07/1995  . TRIGGER FINGER RELEASE  04/25/2006   decompression A-1 pulley left thumb  . UTERINE FIBROID SURGERY  12/95, 7/96   x2  . VENA CAVA FILTER PLACEMENT  2009   during Roux-en-Y surg.    SOCIAL HISTORY: Social History   Tobacco Use  . Smoking status: Former Research scientist (life sciences)  . Smokeless tobacco: Never Used  . Tobacco comment: quit smoking 08/1989  Substance Use Topics  . Alcohol use: No  . Drug use: No    FAMILY HISTORY: Family History  Problem Relation Age of Onset  . Breast cancer Paternal Aunt 32  . Cervical cancer Paternal Grandmother 27       d.40s  . Ovarian cancer Maternal Grandmother 23       d.23  . Colon polyps Father   . Diabetes Father        borderline  . Prostate cancer Father   . High blood pressure Father   . Hypertension Mother   . Hypertension Unknown   . Breast cancer Sister 75       treated with neoadjuvant chemo/radiation and  lumpectomy   ROS: Review of Systems    Constitutional: Positive for weight loss.   PHYSICAL EXAM: Blood pressure 124/77, pulse 62, temperature 98.3 F (36.8 C), temperature source Oral, height _0  (1.575 m), weight 195 lb (88.5 kg), SpO2 99 %. Body mass index is 35.67 kg/m. Physical Exam Vitals signs reviewed.  Constitutional:      Appearance: Normal appearance. She is obese.  Cardiovascular:     Rate and Rhythm: Normal rate.  Pulmonary:     Effort: Pulmonary effort is normal.  Musculoskeletal: Normal range of motion.  Skin:    General: Skin is warm and dry.  Neurological:     Mental Status: She is alert and oriented to person, place, and time.  Psychiatric:        Mood and Affect: Mood normal.        Behavior: Behavior normal.    RECENT LABS AND TESTS: BMET    Component Value Date/Time   NA 136 02/18/2018 1029   NA 138 04/19/2017 1005   K 4.5 02/18/2018 1029   K 4.0 04/19/2017 1005   CL 98 02/18/2018 1029   CO2 23 02/18/2018 1029   CO2 25 04/19/2017 1005   GLUCOSE 98 02/18/2018 1029   GLUCOSE 80 11/26/2017 1544   GLUCOSE 87 04/19/2017 1005   BUN 12 02/18/2018 1029   BUN 12.0 04/19/2017 1005   CREATININE 0.68 02/18/2018 1029   CREATININE 0.77 11/26/2017 1544   CREATININE 0.7 04/19/2017 1005   CALCIUM 9.6 02/18/2018 1029   CALCIUM 8.9 04/19/2017 1005   GFRNONAA 98 02/18/2018 1029   GFRNONAA >60 11/26/2017 1544   GFRAA 113 02/18/2018 1029   GFRAA >60 11/26/2017 1544   Lab Results  Component Value Date   HGBA1C 5.8 (H) 02/18/2018   Lab Results  Component Value Date   INSULIN 4.9 02/18/2018   CBC    Component Value Date/Time   WBC 4.3 11/26/2017 1544   WBC 3.5 (L) 04/19/2017 1005   WBC 7.5 12/14/2016 0120   RBC 3.62 (L) 11/26/2017 1544   HGB 11.5 (L) 11/26/2017 1544   HGB 10.2 (L) 04/19/2017 1005   HCT 34.8 11/26/2017 1544   HCT 31.1 (L) 04/19/2017 1005   PLT 192 11/26/2017 1544   PLT 184 04/19/2017 1005   MCV 96.1 11/26/2017 1544   MCV 97.5 04/19/2017 1005   MCH 31.8 11/26/2017  1544   MCHC 33.0 11/26/2017 1544   RDW 14.5 11/26/2017 1544   RDW 15.3 (H) 04/19/2017 1005   LYMPHSABS 1.3 11/26/2017 1544   LYMPHSABS 1.0 04/19/2017 1005   MONOABS 0.4 11/26/2017 1544   MONOABS 0.3 04/19/2017 1005   EOSABS 0.1 11/26/2017 1544   EOSABS 0.1 04/19/2017 1005   BASOSABS 0.0 11/26/2017 1544   BASOSABS 0.0 04/19/2017 1005   Iron/TIBC/Ferritin/ %Sat No results found for: IRON, TIBC, FERRITIN, IRONPCTSAT Lipid Panel     Component Value Date/Time   CHOL 236 (H) 02/18/2018 1029   TRIG 80 02/18/2018 1029   HDL 88 02/18/2018 1029   CHOLHDL 2.6 09/30/2015 1838   VLDL 12 09/30/2015 1838   LDLCALC 132 (H) 02/18/2018 1029   Hepatic Function Panel     Component Value Date/Time   PROT 6.9 02/18/2018 1029   PROT 6.7 04/19/2017 1005   ALBUMIN 4.5 02/18/2018 1029   ALBUMIN 3.9 04/19/2017 1005   AST 18 02/18/2018 1029   AST 17 11/26/2017 1544   AST 14 04/19/2017 1005   ALT 10 02/18/2018 1029   ALT 14  11/26/2017 1544   ALT 11 04/19/2017 1005   ALKPHOS 81 02/18/2018 1029   ALKPHOS 50 04/19/2017 1005   BILITOT 0.5 02/18/2018 1029   BILITOT 0.4 11/26/2017 1544   BILITOT 0.31 04/19/2017 1005      Component Value Date/Time   TSH 0.802 02/18/2018 1029   Results for RHEGAN, TRUNNELL (MRN [id number]) as of 06/10/2018 13:25  Ref. Range 02/18/2018 10:29  Vitamin D, 25-Hydroxy Latest Ref Range: 30.0 - 100.0 ng/mL 11.6 (L)    OBESITY BEHAVIORAL INTERVENTION VISIT  Today's visit was # 6   Starting weight: 204 lbs Starting date: 02/18/18 Today's weight : Weight: 195 lb (88.5 kg)  Today's date: 06/10/2018 Total lbs lost to date: 9 At least 15 minutes were spent on discussing the following behavioral intervention visit.  ASK: We discussed the diagnosis of obesity with [name] today and Verita agreed to give [name] permission to discuss obesity behavioral modification therapy today.  ASSESS: Dominque has the diagnosis of obesity and her BMI today is 35.6. Nysha is  in the action stage of change.   ADVISE: Rohini was educated on the multiple health risks of obesity as well as the benefit of weight loss to improve her health. She was advised of the need for long term treatment and the importance of lifestyle modifications to improve her current health and to decrease her risk of future health problems.  AGREE: Multiple dietary modification options and treatment options were discussed and Quaneshia agreed to follow the recommendations documented in the above note.  ARRANGE: Kosha was educated on the importance of frequent visits to treat obesity as outlined per CMS and USPSTF guidelines and agreed to schedule her next follow up appointment today.  I, Marcille Blanco, am acting as Location manager for [employer]. Owens Shark, DO  I have reviewed the above documentation for accuracy and completeness, and I agree with the above. -Jearld Lesch, DO

## 2018-06-11 LAB — HEMOGLOBIN A1C
Est. average glucose Bld gHb Est-mCnc: 117 mg/dL
Hgb A1c MFr Bld: 5.7 % — ABNORMAL HIGH (ref 4.8–5.6)

## 2018-06-11 LAB — COMPREHENSIVE METABOLIC PANEL
ALT: 15 IU/L (ref 0–32)
AST: 17 IU/L (ref 0–40)
Albumin/Globulin Ratio: 1.6 (ref 1.2–2.2)
Albumin: 4.1 g/dL (ref 3.8–4.9)
Alkaline Phosphatase: 82 IU/L (ref 39–117)
BUN/Creatinine Ratio: 14 (ref 9–23)
BUN: 11 mg/dL (ref 6–24)
Bilirubin Total: 0.5 mg/dL (ref 0.0–1.2)
CO2: 22 mmol/L (ref 20–29)
Calcium: 9.3 mg/dL (ref 8.7–10.2)
Chloride: 100 mmol/L (ref 96–106)
Creatinine, Ser: 0.8 mg/dL (ref 0.57–1.00)
GFR calc Af Amer: 95 mL/min/{1.73_m2} (ref >59)
GFR calc non Af Amer: 83 mL/min/{1.73_m2} (ref >59)
Globulin, Total: 2.6 g/dL (ref 1.5–4.5)
Glucose: 87 mg/dL (ref 65–99)
Potassium: 4.9 mmol/L (ref 3.5–5.2)
Sodium: 138 mmol/L (ref 134–144)
Total Protein: 6.7 g/dL (ref 6.0–8.5)

## 2018-06-11 LAB — VITAMIN D 25 HYDROXY (VIT D DEFICIENCY, FRACTURES): Vit D, 25-Hydroxy: 24.3 ng/mL — ABNORMAL LOW (ref 30.0–100.0)

## 2018-06-13 ENCOUNTER — Telehealth: Payer: Self-pay | Admitting: Adult Health

## 2018-06-13 NOTE — Telephone Encounter (Signed)
Scheduled appt per 12/4 sch message - left message for patient with appt date and time   

## 2018-06-16 ENCOUNTER — Inpatient Hospital Stay: Payer: Medicare Other | Admitting: Hematology and Oncology

## 2018-06-16 MED FILL — IBUPROFEN 800 MG TAB: 800 | 5 days supply | Qty: 15 | Fill #0

## 2018-06-16 MED FILL — AMOXICILLIN 500 MG CAPSULE: 500 | 8 days supply | Qty: 25 | Fill #0

## 2018-06-19 MED FILL — CLINDAMYCIN HCL 150 MG CAPS: 150 | 10 days supply | Qty: 40 | Fill #0

## 2018-06-19 MED FILL — HYDROCODON-APAP 7.5-325: 7.5-325 | 2 days supply | Qty: 12 | Fill #0

## 2018-06-24 ENCOUNTER — Ambulatory Visit (INDEPENDENT_AMBULATORY_CARE_PROVIDER_SITE_OTHER): Payer: Medicare Other | Admitting: Bariatrics

## 2018-06-24 ENCOUNTER — Encounter (INDEPENDENT_AMBULATORY_CARE_PROVIDER_SITE_OTHER): Payer: Self-pay | Admitting: Bariatrics

## 2018-06-24 VITALS — BP 125/72 | HR 69 | Temp 98.3°F | Ht 62.0 in | Wt 200.0 lb

## 2018-06-24 DIAGNOSIS — I10 Essential (primary) hypertension: Secondary | ICD-10-CM | POA: Diagnosis not present

## 2018-06-24 DIAGNOSIS — E559 Vitamin D deficiency, unspecified: Secondary | ICD-10-CM | POA: Diagnosis not present

## 2018-06-24 DIAGNOSIS — R7303 Prediabetes: Secondary | ICD-10-CM | POA: Diagnosis not present

## 2018-06-24 DIAGNOSIS — Z6836 Body mass index (BMI) 36.0-36.9, adult: Secondary | ICD-10-CM

## 2018-06-24 MED ORDER — VITAMIN D (ERGOCALCIFEROL) 1.25 MG (50000 UNIT) PO CAPS: ORAL_CAPSULE | ORAL | 0 refills | Status: DC

## 2018-06-24 MED FILL — VIT D2 1.25 MG (50,000 UNIT: 1.25 MG | 28 days supply | Qty: 4 | Fill #0

## 2018-06-25 NOTE — Progress Notes (Signed)
Office: [phone]  /  Fax: [phone]   HPI:   Chief Complaint: OBESITY [name] is here to discuss her progress with her obesity treatment plan. She is on the Category 2 plan +200 calories and is following her eating plan approximately 50 % of the time. She states she is walking 4,000 steps 7 times per week. [name] has had a "tooth abscess" and she is currently on an antibiotic. [name] has had pain and she is eating more carbohydrates. [name] is retaining water. Her weight is 200 lb (90.7 kg) today and has had a weight gain of 5 pounds over a period of 2 weeks since her last visit. She has lost 4 lbs since starting treatment with [name].  Vitamin D deficiency [name] has a diagnosis of vitamin D deficiency. Her last vitamin D level was at 24.3 She is currently taking vit D and denies nausea, vomiting or muscle weakness.  Hypertension [name] is a 58 y.o. female with hypertension. She is currently taking Lisinopril. [name] denies chest pain or shortness of breath on exertion. She is working weight loss to help control her blood pressure with the goal of decreasing her risk of heart attack and stroke. [name] blood pressure is well controlled.  Pre-Diabetes [name] has a diagnosis of prediabetes based on her elevated Hgb A1c and was informed this puts her at greater risk of developing diabetes. Her last A1c was at 5.7 and was improved. She is not taking metformin currently and continues to work on diet and exercise to decrease risk of diabetes. She denies polyphagia.  ASSESSMENT AND PLAN:  Vitamin D deficiency - Plan: Vitamin D, Ergocalciferol, (DRISDOL) 1.25 MG (50000 UT) CAPS capsule  Essential hypertension  Pre-diabetes  Class 2 severe obesity with serious comorbidity and body mass index (BMI) of 36.0 to 36.9 in adult, unspecified obesity type ([place])  PLAN:  Vitamin D Deficiency [name] was informed that low vitamin D levels contributes to fatigue  and are associated with obesity, breast, and colon cancer. She agrees to continue to take prescription Vit D _0 ,000 IU every week #4 with no refills and will follow up for routine testing of vitamin D, at least 2-3 times per year. She was informed of the risk of over-replacement of vitamin D and agrees to not increase her dose unless she discusses this with [name] first. [name] agrees to follow up with our clinic in 2 weeks.  Hypertension We discussed sodium restriction, working on healthy weight loss, and a regular exercise program as the means to achieve improved blood pressure control. [name] agreed with this plan and agreed to follow up as directed. We will continue to monitor her blood pressure as well as her progress with the above lifestyle modifications. She will continue her medications as prescribed and will watch for signs of hypotension as she continues her lifestyle modifications.  Pre-Diabetes [name] will continue to work on weight loss, exercise, increasing lean protein and decreasing simple carbohydrates in her diet to help decrease the risk of diabetes. She was informed that eating too many simple carbohydrates or too many calories at one sitting increases the likelihood of GI side effects. [name] agreed to follow up with [name] as directed to monitor her progress.  Obesity [name] is currently in the action stage of change. As such, her goal is to continue with weight loss efforts She has agreed to follow the Category 2 plan +200 calories [name] has been instructed to work up to a goal of 150  minutes of combined cardio and strengthening exercise per week for weight loss and overall health benefits. We discussed the following Behavioral Modification Strategies today: increase H2O intake, no skipping meals, better snacking choices, increasing lean protein intake, decreasing simple carbohydrates, increasing vegetables and work on meal planning and easy cooking plans  [name] has  agreed to follow up with our clinic in 2 weeks. She was informed of the importance of frequent follow up visits to maximize her success with intensive lifestyle modifications for her multiple health conditions.  ALLERGIES: Allergies  Allergen Reactions  . Aspirin Other (See Comments)    ESOPHAGITIS  . Oxycodone Hcl Diarrhea and Nausea And Vomiting  . Propoxyphene N-Acetaminophen Diarrhea and Nausea And Vomiting  . Tramadol Other (See Comments)    Cause migraines  . Adhesive [Tape] Rash and Other (See Comments)    Pulls skin off - please use paper tape  . Prednisone Rash    MEDICATIONS: Current Outpatient Medications on File Prior to Visit  Medication Sig Dispense Refill  . albuterol (PROAIR HFA) 108 (90 Base) MCG/ACT inhaler Inhale 2 puffs into the lungs every 6 (six) hours as needed for wheezing or shortness of breath.    Marland Kitchen aspirin EC 81 MG tablet Take 81 mg by mouth daily.    . Calcium Citrate-Vitamin D (CALCIUM CITRATE +D PO) Take 2 tablets by mouth daily. Calcium 600 mg     . diphenhydrAMINE-APAP, sleep, (TYLENOL PM EXTRA STRENGTH) 50-1000 MG/30ML LIQD Take by mouth.    . letrozole (FEMARA) 2.5 MG tablet Take 1 tablet (2.5 mg total) by mouth daily. 90 tablet 3  . lisinopril (PRINIVIL,ZESTRIL) 20 MG tablet Take 20 mg by mouth daily.    . mometasone (ELOCON) 0.1 % cream Apply 1 application topically daily as needed (rash - summer eczema). Apply to arms    . Multiple Vitamin (MULTIVITAMIN WITH MINERALS) TABS tablet Take 1 tablet by mouth at bedtime.    . orphenadrine (NORFLEX) 100 MG tablet Take 100 mg by mouth 2 (two) times daily.    Marland Kitchen oxybutynin (DITROPAN) 5 MG tablet TAKE 1 TABLET BY MOUTH 3 TIMES DAILY 270 tablet 0  . polyethylene glycol powder (GLYCOLAX/MIRALAX) powder Take 17 g by mouth daily. 3350 g 0  . ranitidine (ZANTAC) 300 MG capsule Take 1 capsule (300 mg total) by mouth 2 (two) times daily. 60 capsule 3  . silver sulfADIAZINE (SILVADENE) 1 % cream Apply 1 application  topically 2 (two) times daily as needed (stomach tears). 50 g 0  . valACYclovir (VALTREX) 1000 MG tablet Take 1 tablet (1,000 mg total) by mouth 3 (three) times daily. 21 tablet 0   No current facility-administered medications on file prior to visit.     PAST MEDICAL HISTORY: Past Medical History:  Diagnosis Date  . Anemia    since bypass  . Arthritis    osteoarthritis  . Asthma    states no asthma attack since 2002  . Back pain   . Breast cancer ([place]) 06/26/16 bx   left breast  . Breast cancer ([place])   . Chronic back pain   . Complication of anesthesia    states takes more than normal to put her to sleep  . Constipation   . Dental bridge present    upper  . Dental crowns present   . DVT of upper extremity (deep vein thrombosis) ([place])   . Fibromyalgia   . Genetic testing 09/19/2016   Ms. Herbison underwent genetic counseling and testing for hereditary cancer  syndromes on 08/21/2016. Her results were negative for mutations in all 46 genes analyzed by [facility]'s 46-gene Common Hereditary Cancers Panel. Genes analyzed include: APC, ATM, AXIN2, BARD1, BMPR1A, BRCA1, BRCA2, BRIP1, CDH1, CDKN2A, CHEK2, CTNNA1, DICER1, EPCAM, GREM1, HOXB13, KIT, MEN1, MLH1, MSH2, MSH3, MSH6, MUTYH, NBN,   . Headache(784.0)    migraines  . History of blood transfusion 06/2005  . History of gallstones   . History of pneumonia   . History of shingles 07/2011  . History of shingles   . HTN (hypertension)   . Itching   . Joint pain   . Muscle weakness   . Neuropathy   . Normal coronary arteries 2003  . Osteoarthropathy   . Personal history of chemotherapy 2018  . Personal history of radiation therapy 2018  . PFO (patent foramen ovale)   . Presence of inferior vena cava filter   . Pulmonary embolism ([place])   . Sleep apnea    used CPAP until after bypass surg.  . Status post gastric bypass for obesity   . Stomach pain   . Stroke [facility]) 12/2002   right-sided weakness  . Urinary incontinence     PAST  SURGICAL HISTORY: Past Surgical History:  Procedure Laterality Date  . ABDOMINAL HYSTERECTOMY  11/1997   complete  . ANTERIOR CERVICAL DECOMP/DISCECTOMY FUSION  02/05/2005   C5-6  . APPENDECTOMY  10/22/2008   laparoscopic  . BREAST LUMPECTOMY Left 07/2016  . BREAST LUMPECTOMY WITH RADIOACTIVE SEED AND SENTINEL LYMPH NODE BIOPSY Left 07/26/2016   Procedure: BREAST LUMPECTOMY WITH RADIOACTIVE SEED AND SENTINEL LYMPH NODE BIOPSY;  Surgeon: Erroll Luna, MD;  Location: [facility];  Service: General;  Laterality: Left;  . BUNIONECTOMY  05/1980   both feet  . BUNIONECTOMY  08/2011   left foot  . CARDIAC CATHETERIZATION  03/04/2002  . CARPAL TUNNEL RELEASE  06/21/2009   right  . CARPAL TUNNEL RELEASE     left hand  . CARPAL TUNNEL RELEASE  10/03/2011   Procedure: CARPAL TUNNEL RELEASE;  Surgeon: Wynonia Sours, MD;  Location: [facility];  Service: Orthopedics;  Laterality: Right;  CARPAL TUNNEL WITH HYPOTHENAR FAT PAD TRANSFER  . CERVICAL SPINE SURGERY  01/2005   titanium plate implanted  . CHOLECYSTECTOMY  1990  . ELBOW SURGERY  08/09/2004   decompression ulnar nerve right elbow  . ENTEROLYSIS  10/22/2008   laparoscopic abd. enterolysis  . GASTRIC ROUX-EN-Y  2009  . HEEL SPUR SURGERY  08/1997   left  . HEMORRHOID SURGERY  03/1993  . LAPAROSCOPIC LYSIS INTESTINAL ADHESIONS  02/14/2000  . NAILBED REPAIR  01/10/2005; 08/2011   exc. matrix bilat. great toe  . OTHER SURGICAL HISTORY  12/1986   pt states that she had surgery to unclog her fallopean tubes  . PORTACATH PLACEMENT N/A 09/24/2016   Procedure: INSERTION PORT-A-CATH WITH [name];  Surgeon: Erroll Luna, MD;  Location: [facility];  Service: General;  Laterality: N/A;  . SHOULDER SURGERY     bilat. - (left:  06/2005)  . TONSILLECTOMY  07/1995  . TRIGGER FINGER RELEASE  04/25/2006   decompression A-1 pulley left thumb  . UTERINE FIBROID SURGERY  12/95, 7/96   x2  . VENA CAVA FILTER PLACEMENT  2009   during Roux-en-Y surg.     SOCIAL HISTORY: Social History   Tobacco Use  . Smoking status: Former Research scientist (life sciences)  . Smokeless tobacco: Never Used  . Tobacco comment: quit smoking 08/1989  Substance Use Topics  . Alcohol  use: No  . Drug use: No    FAMILY HISTORY: Family History  Problem Relation Age of Onset  . Breast cancer Paternal Aunt 38  . Cervical cancer Paternal Grandmother 57       d.40s  . Ovarian cancer Maternal Grandmother 23       d.23  . Colon polyps Father   . Diabetes Father        borderline  . Prostate cancer Father   . High blood pressure Father   . Hypertension Mother   . Hypertension Unknown   . Breast cancer Sister 59       treated with neoadjuvant chemo/radiation and lumpectomy    ROS: Review of Systems  Constitutional: Negative for weight loss.  Respiratory: Negative for shortness of breath (on exertion).   Cardiovascular: Negative for chest pain.  Gastrointestinal: Negative for nausea and vomiting.  Musculoskeletal:       Negative for muscle weakness  Endo/Heme/Allergies:       Negative for polyphagia    PHYSICAL EXAM: Blood pressure 125/72, pulse 69, temperature 98.3 F (36.8 C), temperature source Oral, height _0  (1.575 m), weight 200 lb (90.7 kg), SpO2 97 %. Body mass index is 36.58 kg/m. Physical Exam Vitals signs reviewed.  Constitutional:      Appearance: Normal appearance. She is well-developed. She is obese.  Cardiovascular:     Rate and Rhythm: Normal rate.  Pulmonary:     Effort: Pulmonary effort is normal.  Musculoskeletal: Normal range of motion.  Skin:    General: Skin is warm and dry.  Neurological:     Mental Status: She is alert and oriented to person, place, and time.  Psychiatric:        Mood and Affect: Mood normal.        Behavior: Behavior normal.     RECENT LABS AND TESTS: BMET    Component Value Date/Time   NA 138 06/10/2018 1329   NA 138 04/19/2017 1005   K 4.9 06/10/2018 1329   K 4.0 04/19/2017 1005   CL 100 06/10/2018  1329   CO2 22 06/10/2018 1329   CO2 25 04/19/2017 1005   GLUCOSE 87 06/10/2018 1329   GLUCOSE 80 11/26/2017 1544   GLUCOSE 87 04/19/2017 1005   BUN 11 06/10/2018 1329   BUN 12.0 04/19/2017 1005   CREATININE 0.80 06/10/2018 1329   CREATININE 0.77 11/26/2017 1544   CREATININE 0.7 04/19/2017 1005   CALCIUM 9.3 06/10/2018 1329   CALCIUM 8.9 04/19/2017 1005   GFRNONAA 83 06/10/2018 1329   GFRNONAA >60 11/26/2017 1544   GFRAA 95 06/10/2018 1329   GFRAA >60 11/26/2017 1544   Lab Results  Component Value Date   HGBA1C 5.7 (H) 06/10/2018   HGBA1C 5.8 (H) 02/18/2018   Lab Results  Component Value Date   INSULIN 4.9 02/18/2018   CBC    Component Value Date/Time   WBC 4.3 11/26/2017 1544   WBC 3.5 (L) 04/19/2017 1005   WBC 7.5 12/14/2016 0120   RBC 3.62 (L) 11/26/2017 1544   HGB 11.5 (L) 11/26/2017 1544   HGB 10.2 (L) 04/19/2017 1005   HCT 34.8 11/26/2017 1544   HCT 31.1 (L) 04/19/2017 1005   PLT 192 11/26/2017 1544   PLT 184 04/19/2017 1005   MCV 96.1 11/26/2017 1544   MCV 97.5 04/19/2017 1005   MCH 31.8 11/26/2017 1544   MCHC 33.0 11/26/2017 1544   RDW 14.5 11/26/2017 1544   RDW 15.3 (H) 04/19/2017 1005  LYMPHSABS 1.3 11/26/2017 1544   LYMPHSABS 1.0 04/19/2017 1005   MONOABS 0.4 11/26/2017 1544   MONOABS 0.3 04/19/2017 1005   EOSABS 0.1 11/26/2017 1544   EOSABS 0.1 04/19/2017 1005   BASOSABS 0.0 11/26/2017 1544   BASOSABS 0.0 04/19/2017 1005   Iron/TIBC/Ferritin/ %Sat No results found for: IRON, TIBC, FERRITIN, IRONPCTSAT Lipid Panel     Component Value Date/Time   CHOL 236 (H) 02/18/2018 1029   TRIG 80 02/18/2018 1029   HDL 88 02/18/2018 1029   CHOLHDL 2.6 09/30/2015 1838   VLDL 12 09/30/2015 1838   LDLCALC 132 (H) 02/18/2018 1029   Hepatic Function Panel     Component Value Date/Time   PROT 6.7 06/10/2018 1329   PROT 6.7 04/19/2017 1005   ALBUMIN 4.1 06/10/2018 1329   ALBUMIN 3.9 04/19/2017 1005   AST 17 06/10/2018 1329   AST 17 11/26/2017 1544    AST 14 04/19/2017 1005   ALT 15 06/10/2018 1329   ALT 14 11/26/2017 1544   ALT 11 04/19/2017 1005   ALKPHOS 82 06/10/2018 1329   ALKPHOS 50 04/19/2017 1005   BILITOT 0.5 06/10/2018 1329   BILITOT 0.4 11/26/2017 1544   BILITOT 0.31 04/19/2017 1005      Component Value Date/Time   TSH 0.802 02/18/2018 1029     Ref. Range 06/10/2018 13:29  Vitamin D, 25-Hydroxy Latest Ref Range: 30.0 - 100.0 ng/mL 24.3 (L)     OBESITY BEHAVIORAL INTERVENTION VISIT  Today's visit was # 7   Starting weight: 204 lbs Starting date: 02/18/2018 Today's weight : 200 lbs Today's date: 06/24/2018 Total lbs lost to date: 4 At least 15 minutes were spent on discussing the following behavioral intervention visit.   ASK: We discussed the diagnosis of obesity with [name] today and Paytan agreed to give [name] permission to discuss obesity behavioral modification therapy today.  ASSESS: Elleana has the diagnosis of obesity and her BMI today is 36.57 Jalexa is in the action stage of change   ADVISE: Pallie was educated on the multiple health risks of obesity as well as the benefit of weight loss to improve her health. She was advised of the need for long term treatment and the importance of lifestyle modifications to improve her current health and to decrease her risk of future health problems.  AGREE: Multiple dietary modification options and treatment options were discussed and  Mychael agreed to follow the recommendations documented in the above note.  ARRANGE: Shakyla was educated on the importance of frequent visits to treat obesity as outlined per CMS and USPSTF guidelines and agreed to schedule her next follow up appointment today.  Corey Skains, am acting as Location manager for [employer]. Owens Shark, DO  I have reviewed the above documentation for accuracy and completeness, and I agree with the above. -Jearld Lesch, DO

## 2018-06-27 IMAGING — CR DG KNEE COMPLETE 4+V*L*
4 series · 4 of 4 positions shown · non-contrast
Comparison: 06/01/2011 left knee series.

CLINICAL DATA: 54-year-old female with progressive intermittent
left knee pain for 1 week after exertion. Initial encounter.

EXAM:
LEFT KNEE - COMPLETE 4+ VIEW

[t knee ap left]
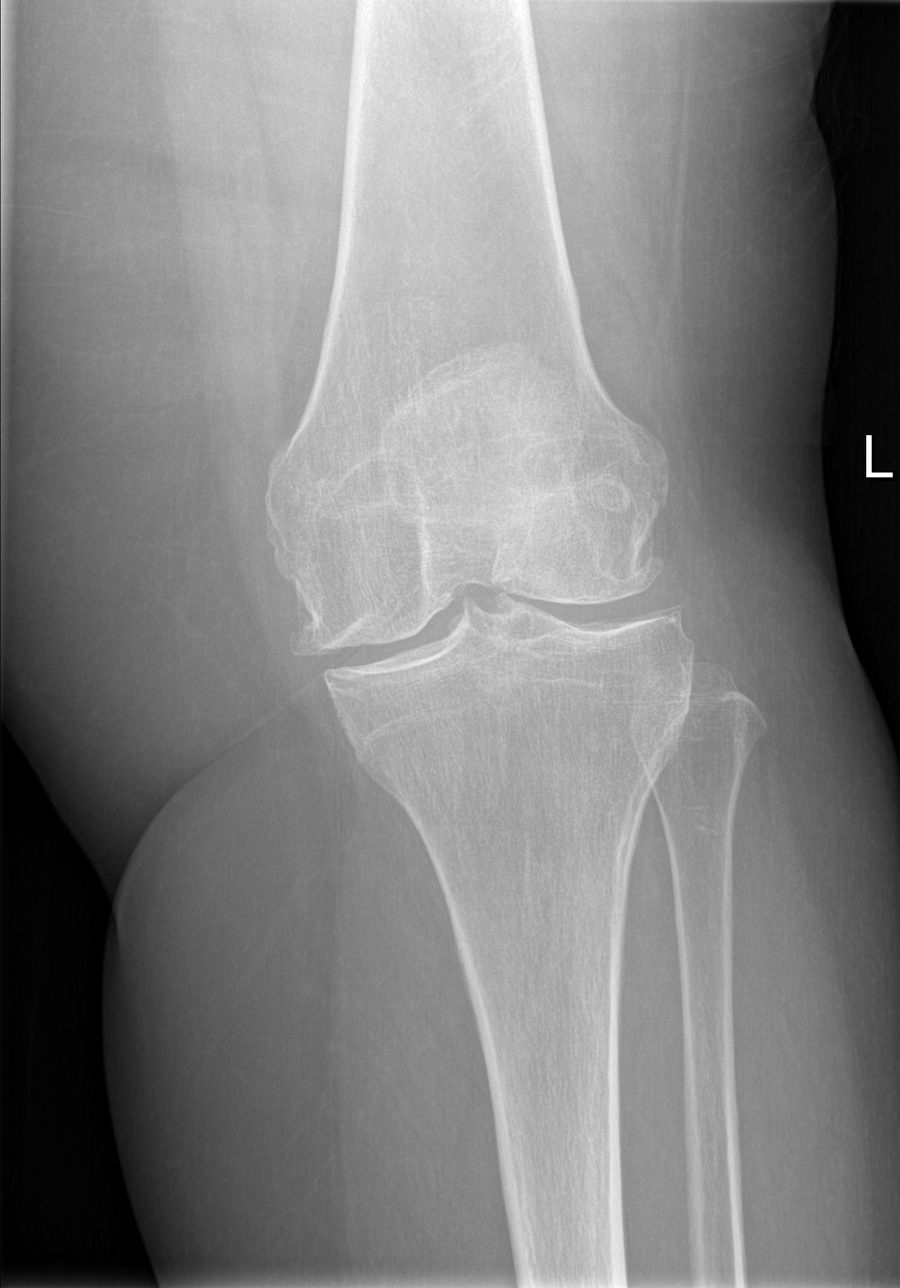

[t knee oblique left (1 of 2)]
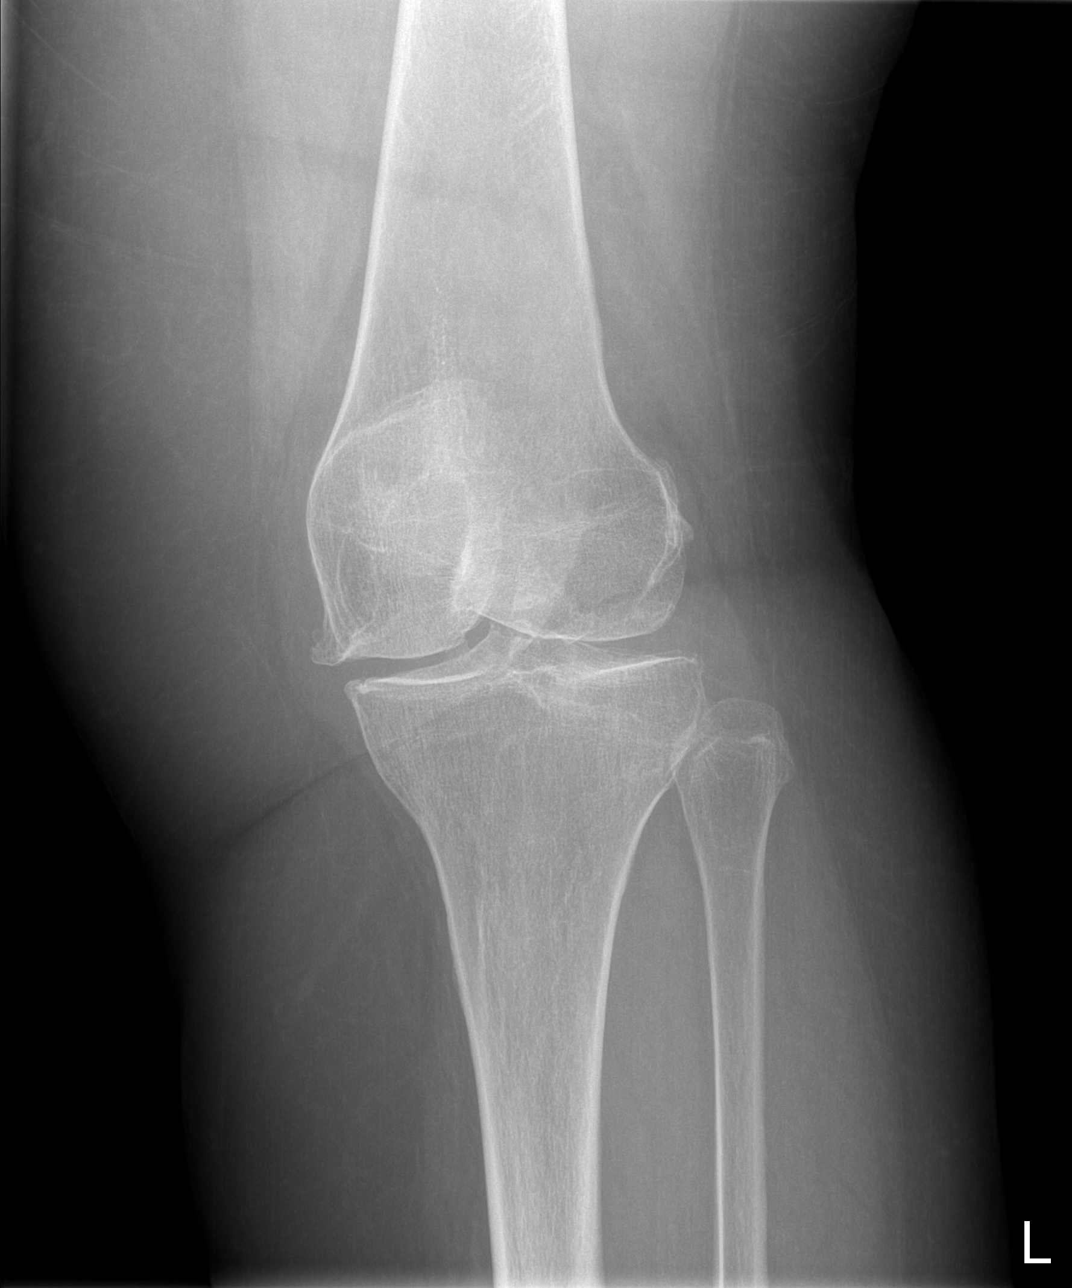

[t knee oblique left (2 of 2)]
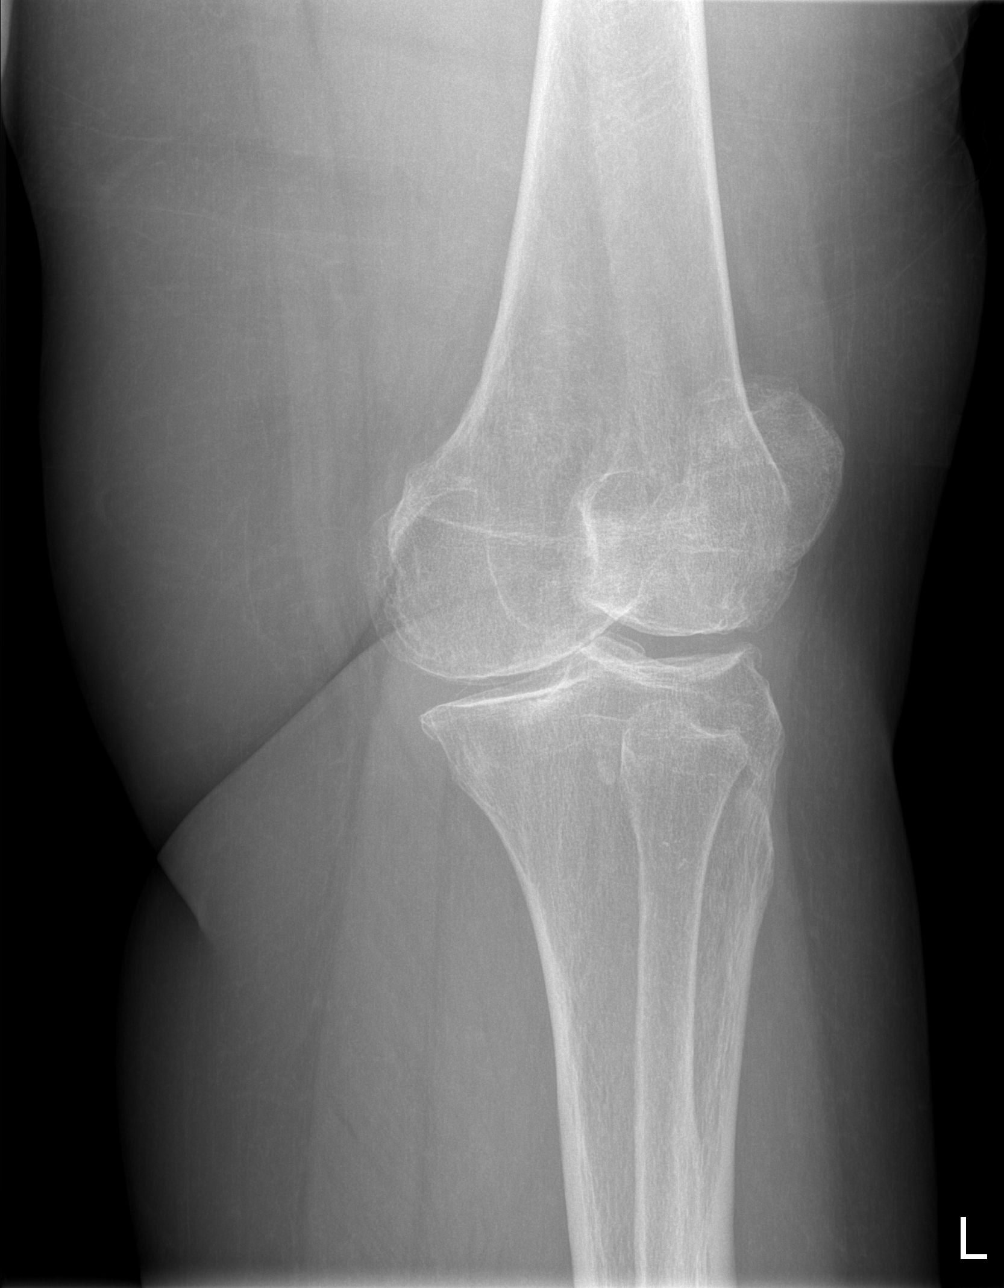

[t knee lat left]
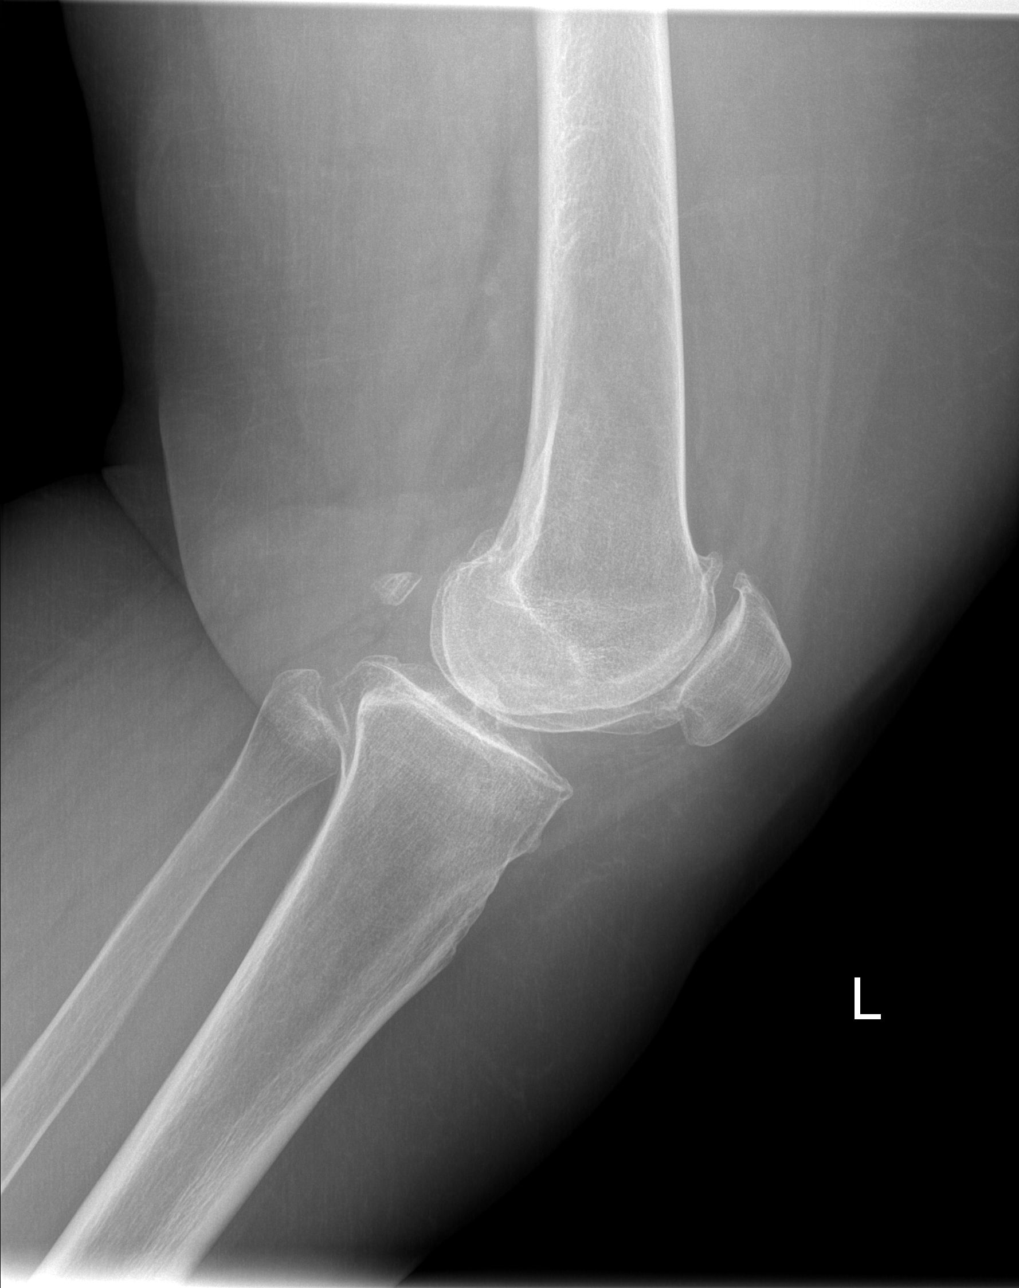

[4 of 4 positions shown; findings below may reference images not displayed]

FINDINGS: Severe tricompartmental degenerative changes with bulky
osteophytosis. Joint space loss may be maximal in the patellofemoral
compartment. No definite joint effusion. Findings are chronic but
progressed. Patella intact. No acute osseous abnormality identified.
IMPRESSION: Chronic, progressed and severe tricompartmental degenerative changes
at the left knee. No acute osseous abnormality identified.

## 2018-07-04 ENCOUNTER — Encounter: Payer: Self-pay | Admitting: Adult Health

## 2018-07-04 ENCOUNTER — Inpatient Hospital Stay: Payer: Medicare Other | Attending: Adult Health | Admitting: Adult Health

## 2018-07-04 VITALS — BP 154/91 | HR 61 | Temp 98.0°F | Resp 18 | Ht 62.0 in | Wt 204.1 lb

## 2018-07-04 DIAGNOSIS — Z87891 Personal history of nicotine dependence: Secondary | ICD-10-CM | POA: Insufficient documentation

## 2018-07-04 DIAGNOSIS — Z923 Personal history of irradiation: Secondary | ICD-10-CM | POA: Insufficient documentation

## 2018-07-04 DIAGNOSIS — Z170 Estrogen receptor positive status [ER+]: Secondary | ICD-10-CM | POA: Insufficient documentation

## 2018-07-04 DIAGNOSIS — Z8042 Family history of malignant neoplasm of prostate: Secondary | ICD-10-CM

## 2018-07-04 DIAGNOSIS — Z803 Family history of malignant neoplasm of breast: Secondary | ICD-10-CM | POA: Diagnosis not present

## 2018-07-04 DIAGNOSIS — R1031 Right lower quadrant pain: Secondary | ICD-10-CM

## 2018-07-04 DIAGNOSIS — Z8041 Family history of malignant neoplasm of ovary: Secondary | ICD-10-CM | POA: Insufficient documentation

## 2018-07-04 DIAGNOSIS — Z8049 Family history of malignant neoplasm of other genital organs: Secondary | ICD-10-CM | POA: Diagnosis not present

## 2018-07-04 DIAGNOSIS — Z79811 Long term (current) use of aromatase inhibitors: Secondary | ICD-10-CM | POA: Insufficient documentation

## 2018-07-04 DIAGNOSIS — Z9221 Personal history of antineoplastic chemotherapy: Secondary | ICD-10-CM | POA: Diagnosis not present

## 2018-07-04 DIAGNOSIS — C50512 Malignant neoplasm of lower-outer quadrant of left female breast: Secondary | ICD-10-CM

## 2018-07-04 NOTE — Patient Instructions (Signed)

## 2018-07-04 NOTE — Assessment & Plan Note (Addendum)
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 Adjuvant chemotherapy with Adriamycin/Cytoxan x 4, then weekly Taxol x 12 completed 04/19/17 2. Adjuvant radiation therapycompleted on 07/26/2017 3. Adjuvant antiestrogen therapy starts 08/28/17 -------------------------------------------------------------------------------------------------------------------------------- Plan: Adj Anti estrogen therapy with Letrozole 2.5 mg daily   Chemo-induced peripheral neuropathy: Stable. Breast cancer surveillance: Mammogram 03/06/2018: Benign, breast density category C  Will get ultrasound to better evaluate this abnormality.  She will cotninue taking Letrozole as she is tolerating it well.  We will get CT a/p to evaluate abdominal pain.  If negative will refer to GI.    Return to clinic in 1 year for follow-up with Dr. Lindi Adie.  Should see Dr. Brantley Stage in 6 months.

## 2018-07-04 NOTE — Progress Notes (Signed)
[facility] Follow up:    Alicia Frees, MD [place] [id number]   DIAGNOSIS: Cancer Staging Malignant neoplasm of lower-outer quadrant of left breast of female, estrogen receptor positive ([place]) Staging form: Breast, AJCC 8th Edition - Clinical: Stage IA (cT1c, cN0(sn), cM0, G3, ER: Positive, PR: Positive, HER2: Negative, Oncotype DX score: 51) - Unsigned - Pathologic: Stage IA (pT1c, pN0, cM0, G3, ER+, PR+, HER2-, Oncotype DX score: 51) - Unsigned   SUMMARY OF ONCOLOGIC HISTORY:   Malignant neoplasm of lower-outer quadrant of left breast of female, estrogen receptor positive ([place])   06/26/2016 Initial Diagnosis    Left breast biopsy 3:00: IDC with DCIS, grade 3, ER 80%, PR 20%, Ki-67 60%, HER-2 negative ratio 1.18; palpable lump: 1.8 cm lesion, no lymph nodes, T1 cN0 stage I a clinical stage    07/26/2016 Surgery    Left lumpectomy: IDC 1.8 cm, with DCIS, margins negative, 0/2 lymph nodes, ER 80%, PR 20%, HER-2 negative ratio 1.18, Ki-67 60%, T1cN0 stage IA     08/21/2016 Oncotype testing    Oncotype DX recurrence score 51, risk of recurrence 34%    08/21/2016 Genetic Testing    Genetic counseling and testing for hereditary cancer syndromes performed on 08/21/2016. Results are negative for pathogenic mutations in 46 genes analyzed by [facility]'s Common Hereditary Cancers Panel. Results are dated 09/14/2016. Genes tested: APC, ATM, AXIN2, BARD1, BMPR1A, BRCA1, BRCA2, BRIP1, CDH1, CDKN2A, CHEK2, CTNNA1, DICER1, EPCAM, GREM1, HOXB13, KIT, MEN1, MLH1, MSH2, MSH3, MSH6, MUTYH, NBN, NF1, NTHL1, PALB2, PDGFRA, PMS2, POLD1, POLE, PTEN, RAD50, RAD51C, RAD51D, SDHA, SDHB, SDHC, SDHD, SMAD4, SMARCA4, STK11, TP53, TSC1, TSC2, and VHL.      11/02/2016 - 04/19/2017 Chemotherapy    Dose dense Adriamycin and Cytoxan 4 followed by Taxol weekly 12    06/10/2017 - 07/26/2017 Radiation Therapy    Adjuvant radiation with Dr. Lisbeth [name]    08/2017 -  Anti-estrogen  oral therapy    Letrozole daily     CURRENT THERAPY: Letrozole  INTERVAL HISTORY: [name] 58 y.o. female returns for evaluation of her h/o breast cancer.  She is taking Letrozole daily and is tolerating it well.  She has occasional warmth she feels from time to time.  She denies any arthralgias, vaginal dryness.  She is currently taking an antibiotic for her tooth.    [name] continues to have neuropathy.  She still has issues at her neck with pain from her port inservion and her previous infection.  She also has severe breast pain.  She takes tylenol or uses heating pads for the pain.    [name] has right lower quadrant pain.  It is intermittent about 4 times per day.  It has been going on for 6 months, and has varying severity.  It lasts as long as 15-20 minutes and is as high as a 10 when it happens.    Her last bone density was evaluated on 03/06/2018 and showed a T score of -1.5.  She also underwent bilateral diagnostic mammogram on 03/06/2018 that showed no evidence of malignancy and breast density category C. She had labs done on 06/10/2018 and showed vitamin d deficiency.  She is taking weekly prescription supplements to boost her levels.     Patient Active Problem List   Diagnosis Date Noted  . Pre-diabetes 03/06/2018  . Vitamin D deficiency 02/18/2018  . Class 2 severe obesity with serious comorbidity and body mass index (BMI) of 37.0 to 37.9  in adult [facility]) 02/18/2018  . Chemotherapy-induced peripheral neuropathy ([place]) 02/15/2017  . Port catheter in place 11/02/2016  . Genetic testing 09/19/2016  . Pre-operative clearance 07/23/2016  . Malignant neoplasm of lower-outer quadrant of left breast of female, estrogen receptor positive ([place]) 07/10/2016  . Essential hypertension   . Chest pain 09/30/2015  . DVT, lower extremity ([place]) 06/18/2011  . DVT of upper extremity (deep vein thrombosis) ([place]) 06/18/2011  . [place] filter in place 06/18/2011  . History of stroke  06/18/2011  . PFO (patent foramen ovale) 06/18/2011  . Status post gastric bypass for obesity 06/18/2011  . PELVIC PAIN, CHRONIC 07/20/2008  . FIBROIDS, UTERUS 07/16/2008  . ASTHMA 07/16/2008  . FIBROMYALGIA 07/16/2008  . CARPAL TUNNEL SYNDROME, HX OF 07/16/2008  . History of pulmonary embolus (PE) 07/16/2008    is allergic to aspirin; oxycodone hcl; propoxyphene n-acetaminophen; tramadol; adhesive [tape]; and prednisone.  MEDICAL HISTORY: Past Medical History:  Diagnosis Date  . Anemia    since bypass  . Arthritis    osteoarthritis  . Asthma    states no asthma attack since 2002  . Back pain   . Breast cancer ([place]) 06/26/16 bx   left breast  . Breast cancer ([place])   . Chronic back pain   . Complication of anesthesia    states takes more than normal to put her to sleep  . Constipation   . Dental bridge present    upper  . Dental crowns present   . DVT of upper extremity (deep vein thrombosis) ([place])   . Fibromyalgia   . Genetic testing 09/19/2016   Ms. Portier underwent genetic counseling and testing for hereditary cancer syndromes on 08/21/2016. Her results were negative for mutations in all 46 genes analyzed by [facility]'s 46-gene Common Hereditary Cancers Panel. Genes analyzed include: APC, ATM, AXIN2, BARD1, BMPR1A, BRCA1, BRCA2, BRIP1, CDH1, CDKN2A, CHEK2, CTNNA1, DICER1, EPCAM, GREM1, HOXB13, KIT, MEN1, MLH1, MSH2, MSH3, MSH6, MUTYH, NBN,   . Headache(784.0)    migraines  . History of blood transfusion 06/2005  . History of gallstones   . History of pneumonia   . History of shingles 07/2011  . History of shingles   . HTN (hypertension)   . Itching   . Joint pain   . Muscle weakness   . Neuropathy   . Normal coronary arteries 2003  . Osteoarthropathy   . Personal history of chemotherapy 2018  . Personal history of radiation therapy 2018  . PFO (patent foramen ovale)   . Presence of inferior vena cava filter   . Pulmonary embolism ([place])   . Sleep apnea    used CPAP  until after bypass surg.  . Status post gastric bypass for obesity   . Stomach pain   . Stroke [facility]) 12/2002   right-sided weakness  . Urinary incontinence     SURGICAL HISTORY: Past Surgical History:  Procedure Laterality Date  . ABDOMINAL HYSTERECTOMY  11/1997   complete  . ANTERIOR CERVICAL DECOMP/DISCECTOMY FUSION  02/05/2005   C5-6  . APPENDECTOMY  10/22/2008   laparoscopic  . BREAST LUMPECTOMY Left 07/2016  . BREAST LUMPECTOMY WITH RADIOACTIVE SEED AND SENTINEL LYMPH NODE BIOPSY Left 07/26/2016   Procedure: BREAST LUMPECTOMY WITH RADIOACTIVE SEED AND SENTINEL LYMPH NODE BIOPSY;  Surgeon: Erroll Luna, MD;  Location: [facility];  Service: General;  Laterality: Left;  . BUNIONECTOMY  05/1980   both feet  . BUNIONECTOMY  08/2011   left foot  . CARDIAC CATHETERIZATION  03/04/2002  .  CARPAL TUNNEL RELEASE  06/21/2009   right  . CARPAL TUNNEL RELEASE     left hand  . CARPAL TUNNEL RELEASE  10/03/2011   Procedure: CARPAL TUNNEL RELEASE;  Surgeon: Wynonia Sours, MD;  Location: [facility];  Service: Orthopedics;  Laterality: Right;  CARPAL TUNNEL WITH HYPOTHENAR FAT PAD TRANSFER  . CERVICAL SPINE SURGERY  01/2005   titanium plate implanted  . CHOLECYSTECTOMY  1990  . ELBOW SURGERY  08/09/2004   decompression ulnar nerve right elbow  . ENTEROLYSIS  10/22/2008   laparoscopic abd. enterolysis  . GASTRIC ROUX-EN-Y  2009  . HEEL SPUR SURGERY  08/1997   left  . HEMORRHOID SURGERY  03/1993  . LAPAROSCOPIC LYSIS INTESTINAL ADHESIONS  02/14/2000  . NAILBED REPAIR  01/10/2005; 08/2011   exc. matrix bilat. great toe  . OTHER SURGICAL HISTORY  12/1986   pt states that she had surgery to unclog her fallopean tubes  . PORTACATH PLACEMENT N/A 09/24/2016   Procedure: INSERTION PORT-A-CATH WITH [name];  Surgeon: Erroll Luna, MD;  Location: [facility];  Service: General;  Laterality: N/A;  . SHOULDER SURGERY     bilat. - (left:  06/2005)  . TONSILLECTOMY  07/1995  . TRIGGER FINGER  RELEASE  04/25/2006   decompression A-1 pulley left thumb  . UTERINE FIBROID SURGERY  12/95, 7/96   x2  . VENA CAVA FILTER PLACEMENT  2009   during Roux-en-Y surg.    SOCIAL HISTORY: Social History   Socioeconomic History  . Marital status: Single    Spouse name: Not on file  . Number of children: 0  . Years of education: Not on file  . Highest education level: Not on file  Occupational History  . Occupation: disabled  Social Needs  . Financial resource strain: Not on file  . Food insecurity:    Worry: Not on file    Inability: Not on file  . Transportation needs:    Medical: Not on file    Non-medical: Not on file  Tobacco Use  . Smoking status: Former Research scientist (life sciences)  . Smokeless tobacco: Never Used  . Tobacco comment: quit smoking 08/1989  Substance and Sexual Activity  . Alcohol use: No  . Drug use: No  . Sexual activity: Never    Birth control/protection: Surgical  Lifestyle  . Physical activity:    Days per week: Not on file    Minutes per session: Not on file  . Stress: Not on file  Relationships  . Social connections:    Talks on phone: Not on file    Gets together: Not on file    Attends religious service: Not on file    Active member of club or organization: Not on file    Attends meetings of clubs or organizations: Not on file    Relationship status: Not on file  . Intimate partner violence:    Fear of current or ex partner: Not on file    Emotionally abused: Not on file    Physically abused: Not on file    Forced sexual activity: Not on file  Other Topics Concern  . Not on file  Social History Narrative  . Not on file    FAMILY HISTORY: Family History  Problem Relation Age of Onset  . Breast cancer Paternal Aunt 21  . Cervical cancer Paternal Grandmother 73       d.40s  . Ovarian cancer Maternal Grandmother 23       d.23  . Colon  polyps Father   . Diabetes Father        borderline  . Prostate cancer Father   . High blood pressure Father   .  Hypertension Mother   . Hypertension Unknown   . Breast cancer Sister 33       treated with neoadjuvant chemo/radiation and lumpectomy    Review of Systems  Constitutional: Positive for fatigue. Negative for appetite change, chills, fever and unexpected weight change.  HENT:   Negative for hearing loss, lump/mass, mouth sores and trouble swallowing.   Eyes: Negative for eye problems and icterus.  Respiratory: Negative for chest tightness, cough and shortness of breath.   Cardiovascular: Negative for chest pain, leg swelling and palpitations.  Gastrointestinal: Positive for abdominal pain. Negative for abdominal distention, constipation, diarrhea, nausea and vomiting.  Endocrine: Negative for hot flashes.  Musculoskeletal: Negative for arthralgias.  Skin: Negative for itching and rash.  Neurological: Positive for numbness. Negative for dizziness, extremity weakness and headaches.  Hematological: Negative for adenopathy. Does not bruise/bleed easily.  Psychiatric/Behavioral: Negative for depression. The patient is not nervous/anxious.       PHYSICAL EXAMINATION  ECOG PERFORMANCE STATUS: 1 - Symptomatic but completely ambulatory  Vitals:   07/04/18 0910  BP: (!) 154/91  Pulse: 61  Resp: 18  Temp: 98 F (36.7 C)  SpO2: 100%    Physical Exam Constitutional:      General: She is not in acute distress.    Appearance: Normal appearance.  HENT:     Head: Normocephalic.     Mouth/Throat:     Mouth: Mucous membranes are moist.     Pharynx: Oropharynx is clear. No oropharyngeal exudate or posterior oropharyngeal erythema.  Eyes:     General: No scleral icterus.    Pupils: Pupils are equal, round, and reactive to light.  Neck:     Musculoskeletal: Normal range of motion and neck supple.  Cardiovascular:     Rate and Rhythm: Normal rate and regular rhythm.     Pulses: Normal pulses.     Heart sounds: Normal heart sounds.  Pulmonary:     Effort: Pulmonary effort is normal.      Breath sounds: Normal breath sounds.     Comments: Left breast s/p lumpectomy.  Small area of skin thickening noted over left axillary tail, asymmetrical.  Right breast benign.  Abdominal:     General: Abdomen is flat. There is no distension.     Palpations: Abdomen is soft.     Tenderness: There is no abdominal tenderness.  Lymphadenopathy:     Cervical: No cervical adenopathy.  Neurological:     Mental Status: She is alert.     LABORATORY DATA:  CBC    Component Value Date/Time   WBC 4.3 11/26/2017 1544   WBC 3.5 (L) 04/19/2017 1005   WBC 7.5 12/14/2016 0120   RBC 3.62 (L) 11/26/2017 1544   HGB 11.5 (L) 11/26/2017 1544   HGB 10.2 (L) 04/19/2017 1005   HCT 34.8 11/26/2017 1544   HCT 31.1 (L) 04/19/2017 1005   PLT 192 11/26/2017 1544   PLT 184 04/19/2017 1005   MCV 96.1 11/26/2017 1544   MCV 97.5 04/19/2017 1005   MCH 31.8 11/26/2017 1544   MCHC 33.0 11/26/2017 1544   RDW 14.5 11/26/2017 1544   RDW 15.3 (H) 04/19/2017 1005   LYMPHSABS 1.3 11/26/2017 1544   LYMPHSABS 1.0 04/19/2017 1005   MONOABS 0.4 11/26/2017 1544   MONOABS 0.3 04/19/2017 1005   EOSABS 0.1  11/26/2017 1544   EOSABS 0.1 04/19/2017 1005   BASOSABS 0.0 11/26/2017 1544   BASOSABS 0.0 04/19/2017 1005    CMP     Component Value Date/Time   NA 138 06/10/2018 1329   NA 138 04/19/2017 1005   K 4.9 06/10/2018 1329   K 4.0 04/19/2017 1005   CL 100 06/10/2018 1329   CO2 22 06/10/2018 1329   CO2 25 04/19/2017 1005   GLUCOSE 87 06/10/2018 1329   GLUCOSE 80 11/26/2017 1544   GLUCOSE 87 04/19/2017 1005   BUN 11 06/10/2018 1329   BUN 12.0 04/19/2017 1005   CREATININE 0.80 06/10/2018 1329   CREATININE 0.77 11/26/2017 1544   CREATININE 0.7 04/19/2017 1005   CALCIUM 9.3 06/10/2018 1329   CALCIUM 8.9 04/19/2017 1005   PROT 6.7 06/10/2018 1329   PROT 6.7 04/19/2017 1005   ALBUMIN 4.1 06/10/2018 1329   ALBUMIN 3.9 04/19/2017 1005   AST 17 06/10/2018 1329   AST 17 11/26/2017 1544   AST 14 04/19/2017  1005   ALT 15 06/10/2018 1329   ALT 14 11/26/2017 1544   ALT 11 04/19/2017 1005   ALKPHOS 82 06/10/2018 1329   ALKPHOS 50 04/19/2017 1005   BILITOT 0.5 06/10/2018 1329   BILITOT 0.4 11/26/2017 1544   BILITOT 0.31 04/19/2017 1005   GFRNONAA 83 06/10/2018 1329   GFRNONAA >60 11/26/2017 1544   GFRAA 95 06/10/2018 1329   GFRAA >60 11/26/2017 1544       PENDING LABS:   RADIOGRAPHIC STUDIES:  No results found.   PATHOLOGY:     ASSESSMENT and THERAPY PLAN:   Malignant neoplasm of lower-outer quadrant of left breast of female, estrogen receptor positive ([place]) 07/26/2016: Left lumpectomy: IDC 1.8 cm, with DCIS, margins negative, 0/2 lymph nodes, ER 80%, PR 20%, HER-2 negative ratio 1.18, Ki-67 60%, T1cN0 stage IA  Oncotype DX score 51: 10 year risk of recurrence greater than 34%, extremely high risk  Treatment Summary: 1. Adjuvant chemotherapy with Adriamycin/Cytoxan x 4, then weekly Taxol x 12 completed 04/19/17 2. Adjuvant radiation therapycompleted on 07/26/2017 3. Adjuvant antiestrogen therapy starts 08/28/17 -------------------------------------------------------------------------------------------------------------------------------- Plan: Adj Anti estrogen therapy with Letrozole 2.5 mg daily   Chemo-induced peripheral neuropathy: Stable. Breast cancer surveillance: Mammogram 03/06/2018: Benign, breast density category C  Will get ultrasound to better evaluate this abnormality.  She will cotninue taking Letrozole as she is tolerating it well.  We will get CT a/p to evaluate abdominal pain.  If negative will refer to GI.    Return to clinic in 1 year for follow-up with Dr. Lindi Adie.  Should see Dr. Brantley Stage in 6 months.     Orders Placed This Encounter  Procedures  . CT Abdomen Pelvis W Contrast    Standing Status:   Future    Standing Expiration Date:   07/04/2019    Order Specific Question:   If indicated for the ordered procedure, I authorize the administration  of contrast media per Radiology protocol    Answer:   Yes    Order Specific Question:   Is patient pregnant?    Answer:   No    Order Specific Question:   Preferred imaging location?    Answer:   [facility]    Order Specific Question:   Is Oral Contrast requested for this exam?    Answer:   Yes, Per Radiology protocol    Order Specific Question:   Radiology Contrast Protocol - do NOT remove file path    Answer:   \\  [email or web address]  . [name] AXILLA LEFT    INS BCBS/MCR Epic ORDER/COSIGN REQ PF 03/06/2018 BCG LEFT AXILLA THICKENING / LEFT BREAST CANCER / NO IMPLANTS / NO NEEDS / SW LORI @ [facility] / JR     Standing Status:   Future    Standing Expiration Date:   09/02/2019    Order Specific Question:   Reason for Exam (SYMPTOM  OR DIAGNOSIS REQUIRED)    Answer:   left axillary tail thickening, please evaluate    Order Specific Question:   Preferred imaging location?    Answer:   [facility]    All questions were answered. The patient knows to call the clinic with any problems, questions or concerns. We can certainly see the patient much sooner if necessary.  A total of (30) minutes of face-to-face time was spent with this patient with greater than 50% of that time in counseling and care-coordination.  This note was electronically signed. Scot Dock, NP 07/04/2018

## 2018-07-09 DIAGNOSIS — M65311 Trigger thumb, right thumb: Secondary | ICD-10-CM | POA: Diagnosis not present

## 2018-07-09 DIAGNOSIS — M65322 Trigger finger, left index finger: Secondary | ICD-10-CM | POA: Diagnosis not present

## 2018-07-11 ENCOUNTER — Ambulatory Visit
Admission: RE | Admit: 2018-07-11 | Discharge: 2018-07-11 | Disposition: A | Discharge status: Home / Self Care | Payer: Medicare Other | Source: Ambulatory Visit | Attending: Adult Health | Admitting: Adult Health

## 2018-07-11 DIAGNOSIS — C50512 Malignant neoplasm of lower-outer quadrant of left female breast: Secondary | ICD-10-CM

## 2018-07-11 DIAGNOSIS — Z170 Estrogen receptor positive status [ER+]: Secondary | ICD-10-CM

## 2018-07-11 DIAGNOSIS — Z853 Personal history of malignant neoplasm of breast: Secondary | ICD-10-CM | POA: Diagnosis not present

## 2018-07-15 ENCOUNTER — Ambulatory Visit (INDEPENDENT_AMBULATORY_CARE_PROVIDER_SITE_OTHER): Payer: Medicare Other | Admitting: Bariatrics

## 2018-07-17 ENCOUNTER — Ambulatory Visit (HOSPITAL_COMMUNITY)
Admission: RE | Admit: 2018-07-17 | Discharge: 2018-07-17 | Disposition: A | Discharge status: Home / Self Care | Payer: Medicare Other | Source: Ambulatory Visit | Attending: Adult Health | Admitting: Adult Health

## 2018-07-17 DIAGNOSIS — C50912 Malignant neoplasm of unspecified site of left female breast: Secondary | ICD-10-CM | POA: Diagnosis not present

## 2018-07-17 DIAGNOSIS — R1031 Right lower quadrant pain: Secondary | ICD-10-CM | POA: Insufficient documentation

## 2018-07-17 DIAGNOSIS — K7689 Other specified diseases of liver: Secondary | ICD-10-CM | POA: Diagnosis not present

## 2018-07-17 DIAGNOSIS — C50512 Malignant neoplasm of lower-outer quadrant of left female breast: Secondary | ICD-10-CM | POA: Diagnosis not present

## 2018-07-17 DIAGNOSIS — Z170 Estrogen receptor positive status [ER+]: Secondary | ICD-10-CM | POA: Insufficient documentation

## 2018-07-17 MED ORDER — IOHEXOL 300 MG/ML  SOLN
100.0000 mL | Freq: Once | INTRAMUSCULAR | Status: AC | PRN
  Administered 2018-07-17: 100 mL via INTRAVENOUS

## 2018-07-17 MED ORDER — SODIUM CHLORIDE (PF) 0.9 % IJ SOLN
INTRAMUSCULAR | Status: AC
  Filled 2018-07-17: qty 50

## 2018-07-18 ENCOUNTER — Other Ambulatory Visit: Payer: Self-pay | Admitting: Adult Health

## 2018-07-18 DIAGNOSIS — C50512 Malignant neoplasm of lower-outer quadrant of left female breast: Secondary | ICD-10-CM

## 2018-07-18 DIAGNOSIS — K769 Liver disease, unspecified: Secondary | ICD-10-CM

## 2018-07-18 DIAGNOSIS — Z170 Estrogen receptor positive status [ER+]: Secondary | ICD-10-CM

## 2018-07-18 NOTE — Progress Notes (Signed)
Reviewed CT a/p with patient.  Noted that CT scan shows constipation and indeterminate liver lesion.  Recommended MRI liver based on indeterminate liver lesion on CT scan.  Patient would prefer to go to [facility] for open MRI.  Orders placed.    Wilber Bihari, NP

## 2018-07-28 ENCOUNTER — Encounter (INDEPENDENT_AMBULATORY_CARE_PROVIDER_SITE_OTHER): Payer: Self-pay | Admitting: Bariatrics

## 2018-07-28 ENCOUNTER — Ambulatory Visit (INDEPENDENT_AMBULATORY_CARE_PROVIDER_SITE_OTHER): Payer: Medicare Other | Admitting: Bariatrics

## 2018-07-28 VITALS — BP 129/61 | HR 87 | Temp 98.0°F | Ht 62.0 in | Wt 200.0 lb

## 2018-07-28 DIAGNOSIS — Z6836 Body mass index (BMI) 36.0-36.9, adult: Secondary | ICD-10-CM

## 2018-07-28 DIAGNOSIS — E559 Vitamin D deficiency, unspecified: Secondary | ICD-10-CM | POA: Diagnosis not present

## 2018-07-28 DIAGNOSIS — Z9889 Other specified postprocedural states: Secondary | ICD-10-CM

## 2018-07-28 DIAGNOSIS — I10 Essential (primary) hypertension: Secondary | ICD-10-CM

## 2018-07-28 DIAGNOSIS — K5909 Other constipation: Secondary | ICD-10-CM | POA: Diagnosis not present

## 2018-07-28 DIAGNOSIS — R7303 Prediabetes: Secondary | ICD-10-CM

## 2018-07-28 MED ORDER — VITAMIN D (ERGOCALCIFEROL) 1.25 MG (50000 UNIT) PO CAPS: ORAL_CAPSULE | ORAL | 0 refills | Status: DC

## 2018-07-28 MED FILL — VIT D2 1.25 MG (50,000 UNIT: 1.25 MG | 4 days supply | Qty: 4 | Fill #0

## 2018-07-29 DIAGNOSIS — Z9889 Other specified postprocedural states: Secondary | ICD-10-CM | POA: Insufficient documentation

## 2018-07-29 NOTE — Progress Notes (Signed)
 Office: [phone]  /  Fax: [phone]   HPI:   Chief Complaint: OBESITY [name] is here to discuss her progress with her obesity treatment plan. She is on the Category 2 plan +200 calories and she is following her eating plan approximately 20 % of the time. She states she is exercising 0 minutes 0 times per week. [name] has been doing well overall, but she has not lost weight since her last visit. Her weight is 200 lb (90.7 kg) today and she has maintained weight over a period of 5 weeks since her last visit. She has lost 4 lbs since starting treatment with us.  Status Post gastric bypass for obesity [name] has a history of gastric bypass and she has some restriction.  Vitamin D deficiency [name] has a diagnosis of vitamin D deficiency. Her last vitamin D level was at 24.3 She is currently taking high dose vit D and denies nausea, vomiting or muscle weakness.  Hypertension [name] is a 57 y.o. female with hypertension. She is taking Lisinopril. [name] denies chest pain or shortness of breath on exertion. She is working weight loss to help control her blood pressure with the goal of decreasing her risk of heart attack and stroke. [name] blood pressure is well controlled.  Pre-Diabetes [name] has a diagnosis of prediabetes based on her elevated Hgb A1c and was informed this puts her at greater risk of developing diabetes. Her last A1c was at 5.7 and last insulin level was at 4.9 She is not taking medications currently and continues to work on diet and exercise to decrease risk of diabetes.   Constipation [name] is seeing a gastroenterologist. She has retained stool. She denies hematochezia or melena.   ASSESSMENT AND PLAN:  Vitamin D deficiency - Plan: Vitamin D, Ergocalciferol, (DRISDOL) 1.25 MG (50000 UT) CAPS capsule  Essential hypertension  Prediabetes  Other constipation  Status post gastric surgery  Class 2 severe obesity with serious  comorbidity and body mass index (BMI) of 36.0 to 36.9 in adult, unspecified obesity type (HCC)  PLAN:  Vitamin D Deficiency Alicia was informed that low vitamin D levels contributes to fatigue and are associated with obesity, breast, and colon cancer. She agrees to continue to take prescription Vit D @50,000 IU every week #4 with no refills and will follow up for routine testing of vitamin D, at least 2-3 times per year. She was informed of the risk of over-replacement of vitamin D and agrees to not increase her dose unless she discusses this with us first. [name] agrees to follow up as directed.  Hypertension We discussed sodium restriction, working on healthy weight loss, and a regular exercise program as the means to achieve improved blood pressure control. [name] agreed with this plan and agreed to follow up as directed. We will continue to monitor her blood pressure as well as her progress with the above lifestyle modifications. She will continue her medications as prescribed and will watch for signs of hypotension as she continues her lifestyle modifications.  Pre-Diabetes [name] will continue to work on weight loss, exercise, increasing lean protein and decreasing simple carbohydrates in her diet to help decrease the risk of diabetes. She was informed that eating too many simple carbohydrates or too many calories at one sitting increases the likelihood of GI side effects. [name] agreed to follow up with us as directed to monitor her progress.  Constipation [name] was advised to increase her H20 intake and she will try wheat bran   and take OTC laxatives. She will consider grand flax seed. [name] will increase raw vegetables.  Status Post gastric bypass for obesity We will follow [name] over time. She agrees to follow up with our clinic in 2 weeks.  Obesity [name] is currently in the action stage of change. As such, her goal is to continue with weight loss efforts She has  agreed to follow the Category 2 plan +200 calories [name] will increase exercise and activity for weight loss and overall health benefits. We discussed the following Behavioral Modification Strategies today: increase H2O intake, no skipping meals, keeping healthy foods in the home, increasing lean protein intake, decreasing simple carbohydrates, increasing vegetables, decrease eating out and work on meal planning and easy cooking plans New sheet (grocery list) was given to patient today.  [name] has agreed to follow up with our clinic in 2 weeks. She was informed of the importance of frequent follow up visits to maximize her success with intensive lifestyle modifications for her multiple health conditions.  ALLERGIES: Allergies  Allergen Reactions  . Aspirin Other (See Comments)    ESOPHAGITIS  . Oxycodone Hcl Diarrhea and Nausea And Vomiting  . Propoxyphene N-Acetaminophen Diarrhea and Nausea And Vomiting  . Tramadol Other (See Comments)    Cause migraines  . Adhesive [Tape] Rash and Other (See Comments)    Pulls skin off - please use paper tape  . Prednisone Rash    MEDICATIONS: Current Outpatient Medications on File Prior to Visit  Medication Sig Dispense Refill  . albuterol (PROAIR HFA) 108 (90 Base) MCG/ACT inhaler Inhale 2 puffs into the lungs every 6 (six) hours as needed for wheezing or shortness of breath.    Marland Kitchen aspirin EC 81 MG tablet Take 81 mg by mouth daily.    . Calcium Citrate-Vitamin D (CALCIUM CITRATE +D PO) Take 2 tablets by mouth daily. Calcium 600 mg     . letrozole (FEMARA) 2.5 MG tablet Take 1 tablet (2.5 mg total) by mouth daily. 90 tablet 3  . lisinopril (PRINIVIL,ZESTRIL) 20 MG tablet Take 20 mg by mouth daily.    . mometasone (ELOCON) 0.1 % cream Apply 1 application topically daily as needed (rash - summer eczema). Apply to arms    . Multiple Vitamin (MULTIVITAMIN WITH MINERALS) TABS tablet Take 1 tablet by mouth at bedtime.    . silver sulfADIAZINE  (SILVADENE) 1 % cream Apply 1 application topically 2 (two) times daily as needed (stomach tears). 50 g 0  . solifenacin (VESICARE) 10 MG tablet     . valACYclovir (VALTREX) 1000 MG tablet Take 1 tablet (1,000 mg total) by mouth 3 (three) times daily. 21 tablet 0   No current facility-administered medications on file prior to visit.     PAST MEDICAL HISTORY: Past Medical History:  Diagnosis Date  . Anemia    since bypass  . Arthritis    osteoarthritis  . Asthma    states no asthma attack since 2002  . Back pain   . Breast cancer ([place]) 06/26/16 bx   left breast  . Breast cancer ([place])   . Chronic back pain   . Complication of anesthesia    states takes more than normal to put her to sleep  . Constipation   . Dental bridge present    upper  . Dental crowns present   . DVT of upper extremity (deep vein thrombosis) ([place])   . Fibromyalgia   . Genetic testing 09/19/2016   Ms. Whipkey underwent genetic counseling and  testing for hereditary cancer syndromes on 08/21/2016. Her results were negative for mutations in all 46 genes analyzed by [facility]'s 46-gene Common Hereditary Cancers Panel. Genes analyzed include: APC, ATM, AXIN2, BARD1, BMPR1A, BRCA1, BRCA2, BRIP1, CDH1, CDKN2A, CHEK2, CTNNA1, DICER1, EPCAM, GREM1, HOXB13, KIT, MEN1, MLH1, MSH2, MSH3, MSH6, MUTYH, NBN,   . Headache(784.0)    migraines  . History of blood transfusion 06/2005  . History of gallstones   . History of pneumonia   . History of shingles 07/2011  . History of shingles   . HTN (hypertension)   . Itching   . Joint pain   . Muscle weakness   . Neuropathy   . Normal coronary arteries 2003  . Osteoarthropathy   . Personal history of chemotherapy 2018  . Personal history of radiation therapy 2018  . PFO (patent foramen ovale)   . Presence of inferior vena cava filter   . Pulmonary embolism (HCC)   . Sleep apnea    used CPAP until after bypass surg.  . Status post gastric bypass for obesity   . Stomach pain   .  Stroke (HCC) 12/2002   right-sided weakness  . Urinary incontinence     PAST SURGICAL HISTORY: Past Surgical History:  Procedure Laterality Date  . ABDOMINAL HYSTERECTOMY  11/1997   complete  . ANTERIOR CERVICAL DECOMP/DISCECTOMY FUSION  02/05/2005   C5-6  . APPENDECTOMY  10/22/2008   laparoscopic  . BREAST LUMPECTOMY Left 07/2016  . BREAST LUMPECTOMY WITH RADIOACTIVE SEED AND SENTINEL LYMPH NODE BIOPSY Left 07/26/2016   Procedure: BREAST LUMPECTOMY WITH RADIOACTIVE SEED AND SENTINEL LYMPH NODE BIOPSY;  Surgeon: Thomas Cornett, MD;  Location: [facility];  Service: General;  Laterality: Left;  . BUNIONECTOMY  05/1980   both feet  . BUNIONECTOMY  08/2011   left foot  . CARDIAC CATHETERIZATION  03/04/2002  . CARPAL TUNNEL RELEASE  06/21/2009   right  . CARPAL TUNNEL RELEASE     left hand  . CARPAL TUNNEL RELEASE  10/03/2011   Procedure: CARPAL TUNNEL RELEASE;  Surgeon: Gary R Kuzma, MD;  Location: [facility];  Service: Orthopedics;  Laterality: Right;  CARPAL TUNNEL WITH HYPOTHENAR FAT PAD TRANSFER  . CERVICAL SPINE SURGERY  01/2005   titanium plate implanted  . CHOLECYSTECTOMY  1990  . ELBOW SURGERY  08/09/2004   decompression ulnar nerve right elbow  . ENTEROLYSIS  10/22/2008   laparoscopic abd. enterolysis  . GASTRIC ROUX-EN-Y  2009  . HEEL SPUR SURGERY  08/1997   left  . HEMORRHOID SURGERY  03/1993  . LAPAROSCOPIC LYSIS INTESTINAL ADHESIONS  02/14/2000  . NAILBED REPAIR  01/10/2005; 08/2011   exc. matrix bilat. great toe  . OTHER SURGICAL HISTORY  12/1986   pt states that she had surgery to unclog her fallopean tubes  . PORTACATH PLACEMENT N/A 09/24/2016   Procedure: INSERTION PORT-A-CATH WITH US;  Surgeon: Cornett, Thomas, MD;  Location: [facility] OR;  Service: General;  Laterality: N/A;  . SHOULDER SURGERY     bilat. - (left:  06/2005)  . TONSILLECTOMY  07/1995  . TRIGGER FINGER RELEASE  04/25/2006   decompression A-1 pulley left thumb  . UTERINE FIBROID SURGERY   12/95, 7/96   x2  . VENA CAVA FILTER PLACEMENT  2009   during Roux-en-Y surg.    SOCIAL HISTORY: Social History   Tobacco Use  . Smoking status: Former Smoker  . Smokeless tobacco: Never Used  . Tobacco comment: quit smoking 08/1989  Substance Use   Topics  . Alcohol use: No  . Drug use: No    FAMILY HISTORY: Family History  Problem Relation Age of Onset  . Breast cancer Paternal Aunt 48  . Cervical cancer Paternal Grandmother 8       d.40s  . Ovarian cancer Maternal Grandmother 23       d.23  . Colon polyps Father   . Diabetes Father        borderline  . Prostate cancer Father   . High blood pressure Father   . Hypertension Mother   . Hypertension Unknown   . Breast cancer Sister 42       treated with neoadjuvant chemo/radiation and lumpectomy    ROS: Review of Systems  Constitutional: Negative for weight loss.  Cardiovascular: Negative for chest pain.  Gastrointestinal: Positive for constipation. Negative for melena, nausea and vomiting.       Negative for hematochezia  Musculoskeletal:       Negative for muscle weakness    PHYSICAL EXAM: Blood pressure 129/61, pulse 87, temperature 98 F (36.7 C), temperature source Oral, height 5' 2" (1.575 m), weight 200 lb (90.7 kg), SpO2 100 %. Body mass index is 36.58 kg/m. Physical Exam Vitals signs reviewed.  Constitutional:      Appearance: Normal appearance. She is well-developed. She is obese.  Cardiovascular:     Rate and Rhythm: Normal rate.  Pulmonary:     Effort: Pulmonary effort is normal.  Musculoskeletal: Normal range of motion.  Skin:    General: Skin is warm and dry.  Neurological:     Mental Status: She is alert and oriented to person, place, and time.     Comments: Uses cane for ambulation   Psychiatric:        Mood and Affect: Mood normal.        Behavior: Behavior normal.     RECENT LABS AND TESTS: BMET    Component Value Date/Time   NA 138 06/10/2018 1329   NA 138 04/19/2017 1005     K 4.9 06/10/2018 1329   K 4.0 04/19/2017 1005   CL 100 06/10/2018 1329   CO2 22 06/10/2018 1329   CO2 25 04/19/2017 1005   GLUCOSE 87 06/10/2018 1329   GLUCOSE 80 11/26/2017 1544   GLUCOSE 87 04/19/2017 1005   BUN 11 06/10/2018 1329   BUN 12.0 04/19/2017 1005   CREATININE 0.80 06/10/2018 1329   CREATININE 0.77 11/26/2017 1544   CREATININE 0.7 04/19/2017 1005   CALCIUM 9.3 06/10/2018 1329   CALCIUM 8.9 04/19/2017 1005   GFRNONAA 83 06/10/2018 1329   GFRNONAA >60 11/26/2017 1544   GFRAA 95 06/10/2018 1329   GFRAA >60 11/26/2017 1544   Lab Results  Component Value Date   HGBA1C 5.7 (H) 06/10/2018   HGBA1C 5.8 (H) 02/18/2018   Lab Results  Component Value Date   INSULIN 4.9 02/18/2018   CBC    Component Value Date/Time   WBC 4.3 11/26/2017 1544   WBC 3.5 (L) 04/19/2017 1005   WBC 7.5 12/14/2016 0120   RBC 3.62 (L) 11/26/2017 1544   HGB 11.5 (L) 11/26/2017 1544   HGB 10.2 (L) 04/19/2017 1005   HCT 34.8 11/26/2017 1544   HCT 31.1 (L) 04/19/2017 1005   PLT 192 11/26/2017 1544   PLT 184 04/19/2017 1005   MCV 96.1 11/26/2017 1544   MCV 97.5 04/19/2017 1005   MCH 31.8 11/26/2017 1544   MCHC 33.0 11/26/2017 1544   RDW 14.5 11/26/2017 1544  RDW 15.3 (H) 04/19/2017 1005   LYMPHSABS 1.3 11/26/2017 1544   LYMPHSABS 1.0 04/19/2017 1005   MONOABS 0.4 11/26/2017 1544   MONOABS 0.3 04/19/2017 1005   EOSABS 0.1 11/26/2017 1544   EOSABS 0.1 04/19/2017 1005   BASOSABS 0.0 11/26/2017 1544   BASOSABS 0.0 04/19/2017 1005   Iron/TIBC/Ferritin/ %Sat No results found for: IRON, TIBC, FERRITIN, IRONPCTSAT Lipid Panel     Component Value Date/Time   CHOL 236 (H) 02/18/2018 1029   TRIG 80 02/18/2018 1029   HDL 88 02/18/2018 1029   CHOLHDL 2.6 09/30/2015 1838   VLDL 12 09/30/2015 1838   LDLCALC 132 (H) 02/18/2018 1029   Hepatic Function Panel     Component Value Date/Time   PROT 6.7 06/10/2018 1329   PROT 6.7 04/19/2017 1005   ALBUMIN 4.1 06/10/2018 1329   ALBUMIN 3.9  04/19/2017 1005   AST 17 06/10/2018 1329   AST 17 11/26/2017 1544   AST 14 04/19/2017 1005   ALT 15 06/10/2018 1329   ALT 14 11/26/2017 1544   ALT 11 04/19/2017 1005   ALKPHOS 82 06/10/2018 1329   ALKPHOS 50 04/19/2017 1005   BILITOT 0.5 06/10/2018 1329   BILITOT 0.4 11/26/2017 1544   BILITOT 0.31 04/19/2017 1005      Component Value Date/Time   TSH 0.802 02/18/2018 1029     Ref. Range 06/10/2018 13:29  Vitamin D, 25-Hydroxy Latest Ref Range: 30.0 - 100.0 ng/mL 24.3 (L)     OBESITY BEHAVIORAL INTERVENTION VISIT  Today's visit was # 8   Starting weight: 204 lbs Starting date: 02/18/2018 Today's weight : 200 lbs Today's date: 07/28/2018 Total lbs lost to date: 4 At least 15 minutes were spent on discussing the following behavioral intervention visit.    07/28/2018  Height 5' 2" (1.575 m)  Weight 200 lb (90.7 kg)  BMI (Calculated) 36.57  BLOOD PRESSURE - SYSTOLIC 953  BLOOD PRESSURE - DIASTOLIC 61   Body Fat % 20.2 %    ASK: We discussed the diagnosis of obesity with [name] today and [name] agreed to give [name] permission to discuss obesity behavioral modification therapy today.  ASSESS: [name] has the diagnosis of obesity and her BMI today is 36.57 [name] is in the action stage of change   ADVISE: [name] was educated on the multiple health risks of obesity as well as the benefit of weight loss to improve her health. She was advised of the need for long term treatment and the importance of lifestyle modifications to improve her current health and to decrease her risk of future health problems.  AGREE: Multiple dietary modification options and treatment options were discussed and  Tywanna agreed to follow the recommendations documented in the above note.  ARRANGE: Chalon was educated on the importance of frequent visits to treat obesity as outlined per CMS and USPSTF guidelines and agreed to schedule her next follow up appointment today.  Corey Skains, am acting as Location manager for [employer]. Owens Shark, DO  I have reviewed the above documentation for accuracy and completeness, and I agree with the above. -Jearld Lesch, DO

## 2018-08-04 ENCOUNTER — Other Ambulatory Visit: Payer: Self-pay

## 2018-08-04 ENCOUNTER — Telehealth: Payer: Self-pay | Admitting: Cardiology

## 2018-08-04 ENCOUNTER — Ambulatory Visit
Admission: RE | Admit: 2018-08-04 | Discharge: 2018-08-04 | Disposition: A | Discharge status: Home / Self Care | Payer: Medicare Other | Source: Ambulatory Visit | Attending: Adult Health | Admitting: Adult Health

## 2018-08-04 DIAGNOSIS — K769 Liver disease, unspecified: Secondary | ICD-10-CM

## 2018-08-04 DIAGNOSIS — Z170 Estrogen receptor positive status [ER+]: Secondary | ICD-10-CM

## 2018-08-04 DIAGNOSIS — C50512 Malignant neoplasm of lower-outer quadrant of left female breast: Secondary | ICD-10-CM

## 2018-08-04 MED ORDER — GADOBENATE DIMEGLUMINE 529 MG/ML IV SOLN
19.0000 mL | Freq: Once | INTRAVENOUS | Status: AC | PRN
  Administered 2018-08-04: 19 mL via INTRAVENOUS

## 2018-08-04 MED FILL — SOLIFENACIN SUCCINATE 10 MG: 10 | 30 days supply | Qty: 30 | Fill #2

## 2018-08-04 MED FILL — LISINOPRIL 10 MG TABLET: 10 | 90 days supply | Qty: 90 | Fill #0

## 2018-08-04 NOTE — Telephone Encounter (Signed)
No objection 

## 2018-08-04 NOTE — Telephone Encounter (Signed)
New message   Patient would like to be released from seeing Dr. Stanford Breed and now see Dr. Sallyanne Kuster. Please advise.

## 2018-08-04 NOTE — Telephone Encounter (Signed)
Ok with me [name]  

## 2018-08-05 ENCOUNTER — Telehealth: Payer: Self-pay | Admitting: Adult Health

## 2018-08-05 MED FILL — ORPHENADRINE 100 MG TAB SA: 100 | 90 days supply | Qty: 180 | Fill #0

## 2018-08-05 NOTE — Telephone Encounter (Signed)
Called patient and LMOM to call me back so that I can review MRI results with her.  Her MRI shows the lesion of concern is a simple cyst.  Wilber Bihari, NP

## 2018-08-12 ENCOUNTER — Encounter (INDEPENDENT_AMBULATORY_CARE_PROVIDER_SITE_OTHER): Payer: Self-pay

## 2018-08-13 ENCOUNTER — Encounter (INDEPENDENT_AMBULATORY_CARE_PROVIDER_SITE_OTHER): Payer: Self-pay

## 2018-08-13 ENCOUNTER — Encounter: Payer: Self-pay | Admitting: Adult Health

## 2018-08-13 ENCOUNTER — Ambulatory Visit (INDEPENDENT_AMBULATORY_CARE_PROVIDER_SITE_OTHER): Payer: Medicare Other | Admitting: Bariatrics

## 2018-08-14 ENCOUNTER — Other Ambulatory Visit: Payer: Self-pay | Admitting: Adult Health

## 2018-08-14 DIAGNOSIS — R109 Unspecified abdominal pain: Secondary | ICD-10-CM

## 2018-09-18 DIAGNOSIS — M79605 Pain in left leg: Secondary | ICD-10-CM | POA: Diagnosis not present

## 2018-09-18 DIAGNOSIS — E559 Vitamin D deficiency, unspecified: Secondary | ICD-10-CM | POA: Diagnosis not present

## 2018-09-18 DIAGNOSIS — M79604 Pain in right leg: Secondary | ICD-10-CM | POA: Diagnosis not present

## 2018-09-18 DIAGNOSIS — J209 Acute bronchitis, unspecified: Secondary | ICD-10-CM | POA: Diagnosis not present

## 2018-09-18 DIAGNOSIS — J40 Bronchitis, not specified as acute or chronic: Secondary | ICD-10-CM | POA: Diagnosis not present

## 2018-09-18 MED FILL — MELOXICAM 15 MG TABLET: 15 | 14 days supply | Qty: 14 | Fill #0

## 2018-09-18 MED FILL — DOXYCYCLINE HYCLATE 100 MG: 100 | 10 days supply | Qty: 20 | Fill #0

## 2018-09-23 MED FILL — VIT D2 1.25 MG (50,000 UNIT: 1.25 MG | 28 days supply | Qty: 4 | Fill #0

## 2018-10-03 DIAGNOSIS — M791 Myalgia, unspecified site: Secondary | ICD-10-CM | POA: Diagnosis not present

## 2018-10-15 DIAGNOSIS — M79671 Pain in right foot: Secondary | ICD-10-CM | POA: Diagnosis not present

## 2018-10-15 DIAGNOSIS — M79672 Pain in left foot: Secondary | ICD-10-CM | POA: Diagnosis not present

## 2018-10-17 DIAGNOSIS — M797 Fibromyalgia: Secondary | ICD-10-CM | POA: Diagnosis not present

## 2018-10-17 DIAGNOSIS — E669 Obesity, unspecified: Secondary | ICD-10-CM | POA: Diagnosis not present

## 2018-10-17 DIAGNOSIS — M150 Primary generalized (osteo)arthritis: Secondary | ICD-10-CM | POA: Diagnosis not present

## 2018-10-17 DIAGNOSIS — Z6839 Body mass index (BMI) 39.0-39.9, adult: Secondary | ICD-10-CM | POA: Diagnosis not present

## 2018-10-23 ENCOUNTER — Other Ambulatory Visit: Payer: Self-pay | Admitting: Adult Health

## 2018-10-23 DIAGNOSIS — K5900 Constipation, unspecified: Secondary | ICD-10-CM | POA: Diagnosis not present

## 2018-10-23 DIAGNOSIS — Z853 Personal history of malignant neoplasm of breast: Secondary | ICD-10-CM

## 2018-10-24 MED FILL — LINZESS 145 MCG CAPSULE: 145 | 30 days supply | Qty: 30 | Fill #0

## 2018-10-29 ENCOUNTER — Ambulatory Visit (INDEPENDENT_AMBULATORY_CARE_PROVIDER_SITE_OTHER): Payer: Medicare Other | Admitting: Bariatrics

## 2018-10-29 ENCOUNTER — Other Ambulatory Visit: Payer: Self-pay

## 2018-10-29 DIAGNOSIS — R7303 Prediabetes: Secondary | ICD-10-CM | POA: Diagnosis not present

## 2018-10-29 DIAGNOSIS — H524 Presbyopia: Secondary | ICD-10-CM | POA: Diagnosis not present

## 2018-10-29 DIAGNOSIS — Z6836 Body mass index (BMI) 36.0-36.9, adult: Secondary | ICD-10-CM | POA: Diagnosis not present

## 2018-10-29 DIAGNOSIS — J45909 Unspecified asthma, uncomplicated: Secondary | ICD-10-CM

## 2018-10-29 DIAGNOSIS — H04123 Dry eye syndrome of bilateral lacrimal glands: Secondary | ICD-10-CM | POA: Diagnosis not present

## 2018-10-29 DIAGNOSIS — H53001 Unspecified amblyopia, right eye: Secondary | ICD-10-CM | POA: Diagnosis not present

## 2018-10-30 MED FILL — DULOXETINE HCL 60 MG CPEP: 60 | 30 days supply | Qty: 30 | Fill #0

## 2018-10-30 NOTE — Progress Notes (Signed)
Office: [phone]  /  Fax: [phone] TeleHealth Visit:  [name] has verbally consented to this TeleHealth visit today. The patient is located in her car, the provider is located at the [employer] [facility] office. The participants in this visit include the listed provider and patient and any and all parties involved. The visit was conducted today via WebEx.  HPI:   Chief Complaint: OBESITY [name] is here to discuss her progress with her obesity treatment plan. She is on the Category 2 plan +200 calories and is following her eating plan approximately 20 % of the time. She states she is walking 30 to 45 minutes 3 times per week. [name] states that she has gained 11 pounds. Her last visit was in early March. This is her first telehealth visit. Her weight is 211 pounds. We were unable to weigh the patient today for this TeleHealth visit. She feels as if she has gained weight since her last visit. She has gained 7 lbs since starting treatment with [name].  Pre-Diabetes [name] has a diagnosis of prediabetes based on her elevated Hgb A1c and was informed this puts her at greater risk of developing diabetes. She is not taking metformin currently and continues to work on diet and exercise to decrease risk of diabetes. She denies polyphagia.  Reactive Airway Asthma [name] has a diagnosis of asthma ans she is using Albuterol inhaler.  ASSESSMENT AND PLAN:  Prediabetes  Uncomplicated asthma, unspecified asthma severity, unspecified whether persistent  Class 2 severe obesity with serious comorbidity and body mass index (BMI) of 36.0 to 36.9 in adult, unspecified obesity type [facility])  PLAN:  Pre-Diabetes [name] will continue to work on weight loss, increasing activities, increasing lean protein and decreasing simple carbohydrates in her diet to help decrease the risk of diabetes. She was informed that eating too many simple carbohydrates or too many calories at one sitting  increases the likelihood of GI side effects. [name] agreed to follow up with [name] as directed to monitor her progress.  Reactive Airway Asthma [name] will continue Albuterol as needed. She will follow up with our clinic at the agreed upon time.  Obesity [name] is currently in the action stage of change. As such, her goal is to continue with weight loss efforts She has agreed to follow the Category 2 plan +200 calories [name] will continue activities for weight loss and overall health benefits. We discussed the following Behavioral Modification Strategies today: increase H2O intake, no skipping meals, keeping healthy foods in the home, increasing lean protein intake, decreasing simple carbohydrates, increasing vegetables, decrease eating out and work on meal planning and easy cooking plans [name] will weigh herself at home and record. She will get back on the plan.  [name] has agreed to follow up with our clinic in 2 weeks. She was informed of the importance of frequent follow up visits to maximize her success with intensive lifestyle modifications for her multiple health conditions.  ALLERGIES: Allergies  Allergen Reactions  . Aspirin Other (See Comments)    ESOPHAGITIS  . Oxycodone Hcl Diarrhea and Nausea And Vomiting  . Propoxyphene N-Acetaminophen Diarrhea and Nausea And Vomiting  . Tramadol Other (See Comments)    Cause migraines  . Adhesive [Tape] Rash and Other (See Comments)    Pulls skin off - please use paper tape  . Prednisone Rash    MEDICATIONS: Current Outpatient Medications on File Prior to Visit  Medication Sig Dispense Refill  . albuterol (PROAIR HFA) 108 (90 Base)  MCG/ACT inhaler Inhale 2 puffs into the lungs every 6 (six) hours as needed for wheezing or shortness of breath.    Marland Kitchen aspirin EC 81 MG tablet Take 81 mg by mouth daily.    . Calcium Citrate-Vitamin D (CALCIUM CITRATE +D PO) Take 2 tablets by mouth daily. Calcium 600 mg     . letrozole (FEMARA)  2.5 MG tablet Take 1 tablet (2.5 mg total) by mouth daily. 90 tablet 3  . lisinopril (PRINIVIL,ZESTRIL) 20 MG tablet Take 20 mg by mouth daily.    . mometasone (ELOCON) 0.1 % cream Apply 1 application topically daily as needed (rash - summer eczema). Apply to arms    . Multiple Vitamin (MULTIVITAMIN WITH MINERALS) TABS tablet Take 1 tablet by mouth at bedtime.    . silver sulfADIAZINE (SILVADENE) 1 % cream Apply 1 application topically 2 (two) times daily as needed (stomach tears). 50 g 0  . solifenacin (VESICARE) 10 MG tablet     . valACYclovir (VALTREX) 1000 MG tablet Take 1 tablet (1,000 mg total) by mouth 3 (three) times daily. 21 tablet 0  . Vitamin D, Ergocalciferol, (DRISDOL) 1.25 MG (50000 UT) CAPS capsule TAKE 1 CAPSULE BY MOUTH BY MOUTH EVERY 7 DAYS 4 capsule 0   No current facility-administered medications on file prior to visit.     PAST MEDICAL HISTORY: Past Medical History:  Diagnosis Date  . Anemia    since bypass  . Arthritis    osteoarthritis  . Asthma    states no asthma attack since 2002  . Back pain   . Breast cancer ([place]) 06/26/16 bx   left breast  . Breast cancer ([place])   . Chronic back pain   . Complication of anesthesia    states takes more than normal to put her to sleep  . Constipation   . Dental bridge present    upper  . Dental crowns present   . DVT of upper extremity (deep vein thrombosis) ([place])   . Fibromyalgia   . Genetic testing 09/19/2016   Ms. Levit underwent genetic counseling and testing for hereditary cancer syndromes on 08/21/2016. Her results were negative for mutations in all 46 genes analyzed by [facility]'s 46-gene Common Hereditary Cancers Panel. Genes analyzed include: APC, ATM, AXIN2, BARD1, BMPR1A, BRCA1, BRCA2, BRIP1, CDH1, CDKN2A, CHEK2, CTNNA1, DICER1, EPCAM, GREM1, HOXB13, KIT, MEN1, MLH1, MSH2, MSH3, MSH6, MUTYH, NBN,   . Headache(784.0)    migraines  . History of blood transfusion 06/2005  . History of gallstones   . History of  pneumonia   . History of shingles 07/2011  . History of shingles   . HTN (hypertension)   . Itching   . Joint pain   . Muscle weakness   . Neuropathy   . Normal coronary arteries 2003  . Osteoarthropathy   . Personal history of chemotherapy 2018  . Personal history of radiation therapy 2018  . PFO (patent foramen ovale)   . Presence of inferior vena cava filter   . Pulmonary embolism ([place])   . Sleep apnea    used CPAP until after bypass surg.  . Status post gastric bypass for obesity   . Stomach pain   . Stroke [facility]) 12/2002   right-sided weakness  . Urinary incontinence     PAST SURGICAL HISTORY: Past Surgical History:  Procedure Laterality Date  . ABDOMINAL HYSTERECTOMY  11/1997   complete  . ANTERIOR CERVICAL DECOMP/DISCECTOMY FUSION  02/05/2005   C5-6  . APPENDECTOMY  10/22/2008  laparoscopic  . BREAST LUMPECTOMY Left 07/2016  . BREAST LUMPECTOMY WITH RADIOACTIVE SEED AND SENTINEL LYMPH NODE BIOPSY Left 07/26/2016   Procedure: BREAST LUMPECTOMY WITH RADIOACTIVE SEED AND SENTINEL LYMPH NODE BIOPSY;  Surgeon: Erroll Luna, MD;  Location: [facility];  Service: General;  Laterality: Left;  . BUNIONECTOMY  05/1980   both feet  . BUNIONECTOMY  08/2011   left foot  . CARDIAC CATHETERIZATION  03/04/2002  . CARPAL TUNNEL RELEASE  06/21/2009   right  . CARPAL TUNNEL RELEASE     left hand  . CARPAL TUNNEL RELEASE  10/03/2011   Procedure: CARPAL TUNNEL RELEASE;  Surgeon: Wynonia Sours, MD;  Location: [facility];  Service: Orthopedics;  Laterality: Right;  CARPAL TUNNEL WITH HYPOTHENAR FAT PAD TRANSFER  . CERVICAL SPINE SURGERY  01/2005   titanium plate implanted  . CHOLECYSTECTOMY  1990  . ELBOW SURGERY  08/09/2004   decompression ulnar nerve right elbow  . ENTEROLYSIS  10/22/2008   laparoscopic abd. enterolysis  . GASTRIC ROUX-EN-Y  2009  . HEEL SPUR SURGERY  08/1997   left  . HEMORRHOID SURGERY  03/1993  . LAPAROSCOPIC LYSIS INTESTINAL ADHESIONS   02/14/2000  . NAILBED REPAIR  01/10/2005; 08/2011   exc. matrix bilat. great toe  . OTHER SURGICAL HISTORY  12/1986   pt states that she had surgery to unclog her fallopean tubes  . PORTACATH PLACEMENT N/A 09/24/2016   Procedure: INSERTION PORT-A-CATH WITH [name];  Surgeon: Erroll Luna, MD;  Location: [place];  Service: General;  Laterality: N/A;  . SHOULDER SURGERY     bilat. - (left:  06/2005)  . TONSILLECTOMY  07/1995  . TRIGGER FINGER RELEASE  04/25/2006   decompression A-1 pulley left thumb  . UTERINE FIBROID SURGERY  12/95, 7/96   x2  . VENA CAVA FILTER PLACEMENT  2009   during Roux-en-Y surg.    SOCIAL HISTORY: Social History   Tobacco Use  . Smoking status: Former Research scientist (life sciences)  . Smokeless tobacco: Never Used  . Tobacco comment: quit smoking 08/1989  Substance Use Topics  . Alcohol use: No  . Drug use: No    FAMILY HISTORY: Family History  Problem Relation Age of Onset  . Breast cancer Paternal Aunt 15  . Cervical cancer Paternal Grandmother 47       d.40s  . Ovarian cancer Maternal Grandmother 23       d.23  . Colon polyps Father   . Diabetes Father        borderline  . Prostate cancer Father   . High blood pressure Father   . Hypertension Mother   . Hypertension Unknown   . Breast cancer Sister 46       treated with neoadjuvant chemo/radiation and lumpectomy    ROS: Review of Systems  Constitutional: Negative for weight loss.  Respiratory: Negative for wheezing.   Endo/Heme/Allergies:       Negative for polyphagia    PHYSICAL EXAM: Pt in no acute distress  RECENT LABS AND TESTS: BMET    Component Value Date/Time   NA 138 06/10/2018 1329   NA 138 04/19/2017 1005   K 4.9 06/10/2018 1329   K 4.0 04/19/2017 1005   CL 100 06/10/2018 1329   CO2 22 06/10/2018 1329   CO2 25 04/19/2017 1005   GLUCOSE 87 06/10/2018 1329   GLUCOSE 80 11/26/2017 1544   GLUCOSE 87 04/19/2017 1005   BUN 11 06/10/2018 1329   BUN 12.0 04/19/2017 1005  CREATININE 0.80 06/10/2018  1329   CREATININE 0.77 11/26/2017 1544   CREATININE 0.7 04/19/2017 1005   CALCIUM 9.3 06/10/2018 1329   CALCIUM 8.9 04/19/2017 1005   GFRNONAA 83 06/10/2018 1329   GFRNONAA >60 11/26/2017 1544   GFRAA 95 06/10/2018 1329   GFRAA >60 11/26/2017 1544   Lab Results  Component Value Date   HGBA1C 5.7 (H) 06/10/2018   HGBA1C 5.8 (H) 02/18/2018   Lab Results  Component Value Date   INSULIN 4.9 02/18/2018   CBC    Component Value Date/Time   WBC 4.3 11/26/2017 1544   WBC 3.5 (L) 04/19/2017 1005   WBC 7.5 12/14/2016 0120   RBC 3.62 (L) 11/26/2017 1544   HGB 11.5 (L) 11/26/2017 1544   HGB 10.2 (L) 04/19/2017 1005   HCT 34.8 11/26/2017 1544   HCT 31.1 (L) 04/19/2017 1005   PLT 192 11/26/2017 1544   PLT 184 04/19/2017 1005   MCV 96.1 11/26/2017 1544   MCV 97.5 04/19/2017 1005   MCH 31.8 11/26/2017 1544   MCHC 33.0 11/26/2017 1544   RDW 14.5 11/26/2017 1544   RDW 15.3 (H) 04/19/2017 1005   LYMPHSABS 1.3 11/26/2017 1544   LYMPHSABS 1.0 04/19/2017 1005   MONOABS 0.4 11/26/2017 1544   MONOABS 0.3 04/19/2017 1005   EOSABS 0.1 11/26/2017 1544   EOSABS 0.1 04/19/2017 1005   BASOSABS 0.0 11/26/2017 1544   BASOSABS 0.0 04/19/2017 1005   Iron/TIBC/Ferritin/ %Sat No results found for: IRON, TIBC, FERRITIN, IRONPCTSAT Lipid Panel     Component Value Date/Time   CHOL 236 (H) 02/18/2018 1029   TRIG 80 02/18/2018 1029   HDL 88 02/18/2018 1029   CHOLHDL 2.6 09/30/2015 1838   VLDL 12 09/30/2015 1838   LDLCALC 132 (H) 02/18/2018 1029   Hepatic Function Panel     Component Value Date/Time   PROT 6.7 06/10/2018 1329   PROT 6.7 04/19/2017 1005   ALBUMIN 4.1 06/10/2018 1329   ALBUMIN 3.9 04/19/2017 1005   AST 17 06/10/2018 1329   AST 17 11/26/2017 1544   AST 14 04/19/2017 1005   ALT 15 06/10/2018 1329   ALT 14 11/26/2017 1544   ALT 11 04/19/2017 1005   ALKPHOS 82 06/10/2018 1329   ALKPHOS 50 04/19/2017 1005   BILITOT 0.5 06/10/2018 1329   BILITOT 0.4 11/26/2017 1544    BILITOT 0.31 04/19/2017 1005      Component Value Date/Time   TSH 0.802 02/18/2018 1029     Ref. Range 06/10/2018 13:29  Vitamin D, 25-Hydroxy Latest Ref Range: 30.0 - 100.0 ng/mL 24.3 (L)    I, Doreene Nest, am acting as Location manager for [employer]. Owens Shark, DO  I have reviewed the above documentation for accuracy and completeness, and I agree with the above. -Jearld Lesch, DO

## 2018-11-03 ENCOUNTER — Encounter (INDEPENDENT_AMBULATORY_CARE_PROVIDER_SITE_OTHER): Payer: Self-pay | Admitting: Bariatrics

## 2018-11-03 DIAGNOSIS — M65342 Trigger finger, left ring finger: Secondary | ICD-10-CM | POA: Diagnosis not present

## 2018-11-03 DIAGNOSIS — M65341 Trigger finger, right ring finger: Secondary | ICD-10-CM | POA: Diagnosis not present

## 2018-11-03 DIAGNOSIS — M65311 Trigger thumb, right thumb: Secondary | ICD-10-CM | POA: Diagnosis not present

## 2018-11-03 DIAGNOSIS — M65332 Trigger finger, left middle finger: Secondary | ICD-10-CM | POA: Diagnosis not present

## 2018-11-04 ENCOUNTER — Encounter (HOSPITAL_BASED_OUTPATIENT_CLINIC_OR_DEPARTMENT_OTHER): Payer: Self-pay | Admitting: *Deleted

## 2018-11-04 ENCOUNTER — Other Ambulatory Visit: Payer: Self-pay | Admitting: Orthopedic Surgery

## 2018-11-04 ENCOUNTER — Other Ambulatory Visit: Payer: Self-pay

## 2018-11-05 ENCOUNTER — Telehealth: Payer: Self-pay

## 2018-11-05 MED FILL — SOLIFENACIN SUCCINATE 10 MG: 10 | 30 days supply | Qty: 30 | Fill #3

## 2018-11-05 NOTE — Telephone Encounter (Signed)
   [facility] Pre-operative Risk Assessment    Request for surgical clearance:  1. What type of surgery is being performed? release A1 pulley right thumb and right ring finger   2. When is this surgery scheduled? 11/11/18   3. What type of clearance is required (medical clearance vs. Pharmacy clearance to hold med vs. Both)? medical  4. Are there any medications that need to be held prior to surgery and how long? n/a   5. Practice name and name of physician performing surgery? [facility]  Dr Daryll Brod   6. What is your office phone number? [phone]    7.   What is your office fax number? [phone]  Attn: Cyndee Brightly, RN  8.   Anesthesia type (None, local, MAC, general)? IV regional forearm block   Patti Shorb, [place] 11/05/2018, 5:08 PM  _________________________________________________________________   (provider comments below)

## 2018-11-06 ENCOUNTER — Telehealth: Payer: Self-pay | Admitting: Cardiovascular Disease

## 2018-11-06 NOTE — Telephone Encounter (Signed)
Spoke to patient appointment scheduled with Fabian Sharp PA tomorrow 6/19 at 8:15 am for surgical clearance.

## 2018-11-06 NOTE — Telephone Encounter (Signed)
error 

## 2018-11-06 NOTE — Telephone Encounter (Signed)
New Message     Pt has her COVID screening anytime between 9am-1pm  Please call and let pt know before 9am if she can come into the office

## 2018-11-06 NOTE — Telephone Encounter (Signed)
   Primary Cardiologist:No primary care provider on file.  Chart reviewed as part of pre-operative protocol coverage. Because of [name]'s past medical history and time since last visit, he/she will require a follow-up visit in order to better assess preoperative cardiovascular risk.  Pre-op covering staff: - Please schedule appointment and call patient to inform them. - Please contact requesting surgeon's office via preferred method (i.e, phone, fax) to inform them of need for appointment prior to surgery.  If applicable, this message will also be routed to pharmacy pool and/or primary cardiologist for input on holding anticoagulant/antiplatelet agent as requested below so that this information is available at time of patient's appointment.   Kathyrn Drown, NP  11/06/2018, 10:58 AM

## 2018-11-06 NOTE — Telephone Encounter (Addendum)
1st attempt to reach pt to schedule sooner appt. Lvm

## 2018-11-06 NOTE — Progress Notes (Signed)
Cardiology Office Note:    Date:  11/07/2018   ID:  [name], DOB 10/08/1960, MRN [id number]  PCP:  Alicia Frees, MD  Cardiologist:  Alicia Klein, MD   Referring MD: Alicia Frees, MD   Chief Complaint  Patient presents with  . Pre-op Exam    History of Present Illness:    [name] is a 58 y.o. female with a hx of normal coronaries by cardiac catheterization in 2003, per Dr. Jacalyn [name] note dated 10/11/17. She has a history of CVA and patent foramen ovale. Hx of prior PE/DVT treated with coumadin and then IVC filter. Nuclear stress test in 09/2015 negative for reversible ischemia. Echo 09/2016 with normal LV function, moderate diastolic dysfunction and mild biatrial enlargement. Pt has a history of gastric bypass and follows with bariatric surgery and GI. At her last cardiology clinic visit with Dr. Stanford [name], she reported occasional chest pain which seemed to have been chronic. Due to the chronic nature of her symptoms, normal nuclear study and EKG without ischemic changes, further ischemic evaluation was not pursued.   She was added to my schedule today for preoperative clearance for thumb and finger surgery requiring regional forearm block. Of note, she underwent lumpectomy for breast cancer 2585 without complications. She feels well, no complaints of chest pain, SOB, DOE, orthopnea, lower extremity swelling, palpitations, or syncope. She is maintained on 20 mg lisinopril and 81 mg ASA. She is doing well with her IVC. No anticoagulation (coumadin stopped in 2009). Her only complaint is about a healing skin infection behind her ear. I asked her to follow with PCP.   CMP in Jan 2020 WNL. I will not recheck. Her cholesterol was elevted 02/2018. She has been working on exercise and diet. We will recheck today. She is having problems with fibromyalgia and just started cymbalta. We discussed if she needed a statin, we will start a very low dose and slowly increase.   Past  Medical History:  Diagnosis Date  . Anemia    since bypass  . Arthritis    osteoarthritis  . Asthma    states no asthma attack since 2002  . Back pain   . Breast cancer ([place]) 06/26/16 bx   left breast  . Breast cancer ([place])   . Chronic back pain   . Complication of anesthesia    states takes more than normal to put her to sleep  . Constipation   . Dental bridge present    upper  . Dental crowns present   . DVT of upper extremity (deep vein thrombosis) ([place])   . Fibromyalgia   . Genetic testing 09/19/2016   Ms. Luevano underwent genetic counseling and testing for hereditary cancer syndromes on 08/21/2016. Her results were negative for mutations in all 46 genes analyzed by [facility]'s 46-gene Common Hereditary Cancers Panel. Genes analyzed include: APC, ATM, AXIN2, BARD1, BMPR1A, BRCA1, BRCA2, BRIP1, CDH1, CDKN2A, CHEK2, CTNNA1, DICER1, EPCAM, GREM1, HOXB13, KIT, MEN1, MLH1, MSH2, MSH3, MSH6, MUTYH, NBN,   . Headache(784.0)    migraines  . History of blood transfusion 06/2005  . History of gallstones   . History of pneumonia   . History of shingles 07/2011  . History of shingles   . HTN (hypertension)   . Itching   . Joint pain   . Muscle weakness   . Neuropathy   . Normal coronary arteries 2003  . Osteoarthropathy   . Personal history of chemotherapy 2018  . Personal history of radiation therapy 2018  .  PFO (patent foramen ovale)   . Presence of inferior vena cava filter   . Pulmonary embolism ([place])   . Sleep apnea    used CPAP until after bypass surg.  . Status post gastric bypass for obesity   . Stomach pain   . Stroke [facility]) 12/2002   right-sided weakness  . Urinary incontinence     Past Surgical History:  Procedure Laterality Date  . ABDOMINAL HYSTERECTOMY  11/1997   complete  . ANTERIOR CERVICAL DECOMP/DISCECTOMY FUSION  02/05/2005   C5-6  . APPENDECTOMY  10/22/2008   laparoscopic  . BREAST LUMPECTOMY Left 07/2016  . BREAST LUMPECTOMY WITH RADIOACTIVE SEED AND SENTINEL  LYMPH NODE BIOPSY Left 07/26/2016   Procedure: BREAST LUMPECTOMY WITH RADIOACTIVE SEED AND SENTINEL LYMPH NODE BIOPSY;  Surgeon: Erroll Luna, MD;  Location: [place];  Service: General;  Laterality: Left;  . BUNIONECTOMY  05/1980   both feet  . BUNIONECTOMY  08/2011   left foot  . CARDIAC CATHETERIZATION  03/04/2002  . CARPAL TUNNEL RELEASE  06/21/2009   right  . CARPAL TUNNEL RELEASE     left hand  . CARPAL TUNNEL RELEASE  10/03/2011   Procedure: CARPAL TUNNEL RELEASE;  Surgeon: Wynonia Sours, MD;  Location: [place];  Service: Orthopedics;  Laterality: Right;  CARPAL TUNNEL WITH HYPOTHENAR FAT PAD TRANSFER  . CERVICAL SPINE SURGERY  01/2005   titanium plate implanted  . CHOLECYSTECTOMY  1990  . ELBOW SURGERY  08/09/2004   decompression ulnar nerve right elbow  . ENTEROLYSIS  10/22/2008   laparoscopic abd. enterolysis  . GASTRIC ROUX-EN-Y  2009  . HEEL SPUR SURGERY  08/1997   left  . HEMORRHOID SURGERY  03/1993  . LAPAROSCOPIC LYSIS INTESTINAL ADHESIONS  02/14/2000  . NAILBED REPAIR  01/10/2005; 08/2011   exc. matrix bilat. great toe  . OTHER SURGICAL HISTORY  12/1986   pt states that she had surgery to unclog her fallopean tubes  . PORTACATH PLACEMENT N/A 09/24/2016   Procedure: INSERTION PORT-A-CATH WITH [name];  Surgeon: Erroll Luna, MD;  Location: [place];  Service: General;  Laterality: N/A;  . SHOULDER SURGERY     bilat. - (left:  06/2005)  . TONSILLECTOMY  07/1995  . TRIGGER FINGER RELEASE  04/25/2006   decompression A-1 pulley left thumb  . UTERINE FIBROID SURGERY  12/95, 7/96   x2  . VENA CAVA FILTER PLACEMENT  2009   during Roux-en-Y surg.    Current Medications: Current Meds  Medication Sig  . albuterol (PROAIR HFA) 108 (90 Base) MCG/ACT inhaler Inhale 2 puffs into the lungs every 6 (six) hours as needed for wheezing or shortness of breath.  Marland Kitchen aspirin EC 81 MG tablet Take 81 mg by mouth daily.  . Calcium Citrate-Vitamin D (CALCIUM CITRATE +D PO)  Take 2 tablets by mouth daily. Calcium 600 mg   . DULoxetine (CYMBALTA) 60 MG capsule Take 60 mg by mouth daily.  Marland Kitchen letrozole (FEMARA) 2.5 MG tablet Take 1 tablet (2.5 mg total) by mouth daily.  Marland Kitchen linaclotide (LINZESS) 145 MCG CAPS capsule Take 145 mcg by mouth daily before breakfast.  . lisinopril (PRINIVIL,ZESTRIL) 20 MG tablet Take 20 mg by mouth daily.  . mometasone (ELOCON) 0.1 % cream Apply 1 application topically daily as needed (rash - summer eczema). Apply to arms  . Multiple Vitamin (MULTIVITAMIN WITH MINERALS) TABS tablet Take 1 tablet by mouth at bedtime.  . silver sulfADIAZINE (SILVADENE) 1 % cream Apply 1 application topically  2 (two) times daily as needed (stomach tears).  . solifenacin (VESICARE) 10 MG tablet   . valACYclovir (VALTREX) 1000 MG tablet Take 1 tablet (1,000 mg total) by mouth 3 (three) times daily.  . Vitamin D, Ergocalciferol, (DRISDOL) 1.25 MG (50000 UT) CAPS capsule TAKE 1 CAPSULE BY MOUTH BY MOUTH EVERY 7 DAYS     Allergies:   Aspirin, Oxycodone hcl, Propoxyphene n-acetaminophen, Tramadol, Adhesive [tape], and Prednisone   Social History   Socioeconomic History  . Marital status: Single    Spouse name: Not on file  . Number of children: 0  . Years of education: Not on file  . Highest education level: Not on file  Occupational History  . Occupation: disabled  Social Needs  . Financial resource strain: Not on file  . Food insecurity    Worry: Not on file    Inability: Not on file  . Transportation needs    Medical: Not on file    Non-medical: Not on file  Tobacco Use  . Smoking status: Former Research scientist (life sciences)  . Smokeless tobacco: Never Used  . Tobacco comment: quit smoking 08/1989  Substance and Sexual Activity  . Alcohol use: No  . Drug use: No  . Sexual activity: Not Currently    Birth control/protection: Surgical  Lifestyle  . Physical activity    Days per week: Not on file    Minutes per session: Not on file  . Stress: Not on file   Relationships  . Social Herbalist on phone: Not on file    Gets together: Not on file    Attends religious service: Not on file    Active member of club or organization: Not on file    Attends meetings of clubs or organizations: Not on file    Relationship status: Not on file  Other Topics Concern  . Not on file  Social History Narrative  . Not on file     Family History: The patient's family history includes Breast cancer (age of onset: 67) in her paternal aunt; Breast cancer (age of onset: 2) in her sister; Cervical cancer (age of onset: 59) in her paternal grandmother; Colon polyps in her father; Diabetes in her father; High blood pressure in her father; Hypertension in her mother and another family member; Ovarian cancer (age of onset: 42) in her maternal grandmother; Prostate cancer in her father.  ROS:   Please see the history of present illness.     All other systems reviewed and are negative.  EKGs/Labs/Other Studies Reviewed:    The following studies were reviewed today:  Echo 10/11/16: Study Conclusions  - Left ventricle: The cavity size was normal. Wall thickness was   normal. Systolic function was normal. The estimated ejection   fraction was in the range of 60% to 65%. Wall motion was normal;   there were no regional wall motion abnormalities. Features are   consistent with a pseudonormal left ventricular filling pattern,   with concomitant abnormal relaxation and increased filling   pressure (grade 2 diastolic dysfunction). - Left atrium: The atrium was mildly dilated. - Right atrium: The atrium was mildly dilated.   EKG:  EKG is ordered today.  The ekg ordered today demonstrates sinus rhythm, HR 60, PVC  Recent Labs: 11/26/2017: Hemoglobin 11.5; Platelet Count 192 02/18/2018: TSH 0.802 06/10/2018: ALT 15; BUN 11; Creatinine, Ser 0.80; Potassium 4.9; Sodium 138  Recent Lipid Panel    Component Value Date/Time   CHOL 236 (H) 02/18/2018  1029    TRIG 80 02/18/2018 1029   HDL 88 02/18/2018 1029   CHOLHDL 2.6 09/30/2015 1838   VLDL 12 09/30/2015 1838   LDLCALC 132 (H) 02/18/2018 1029    Physical Exam:    VS:  BP 131/71   Pulse 60   Ht _0  (1.575 m)   Wt 209 lb (94.8 kg)   BMI 38.23 kg/m     Wt Readings from Last 3 Encounters:  11/07/18 209 lb (94.8 kg)  07/28/18 200 lb (90.7 kg)  07/04/18 204 lb 1.6 oz (92.6 kg)     GEN: Well nourished, well developed in no acute distress HEENT: Normal NECK: No JVD; No carotid bruits CARDIAC: RRR, no murmurs, rubs, gallops RESPIRATORY:  Clear to auscultation without rales, wheezing or rhonchi  ABDOMEN: Soft, non-tender, non-distended MUSCULOSKELETAL:  No edema; No deformity  SKIN: Warm and dry NEUROLOGIC:  Alert and oriented x 3 PSYCHIATRIC:  Normal affect   ASSESSMENT:    1. Pre-operative clearance   2. Essential hypertension   3. Dyslipidemia   4. Chronic diastolic heart failure (HCC)    PLAN:    In order of problems listed above:  Pre-operative clearance  Patient has no evidence of heart disease or systolic heart failure, and is not diabetic. She does have a history of stroke secondary to patent foramen ovale. IVC filter in place. She is able to complete greater than 4.0 METS.  According to the RCRI, patient is class II risk with 0.9% chance of a major cardiac complication in the perioperative period. Therefore, the patient may proceed with surgery without further testing.    Essential hypertension Pressure well-controlled. CMP earlier this year with normal renal function and electrolytes.    Dyslipidemia  02/18/2018: Cholesterol, Total 236; HDL 88; LDL Calculated 132; Triglycerides 80 Lipid profile was elevated compared to 3 years ago. She has been working on diet and and exercise. Given her risk factors, will check a fasting lipid profile today. She has trouble with fibromyalgia and just started symbalta. We discussed if a statin was needed, we would start with a low  dose and titrate conservatively. She agreed.   Chronic diastolic heart failure Grade 2 DD by echo in 2018. She is eating well and is euvolemic on exam. No medication changes.    Keep follow up appt with Dr. Sallyanne Kuster in August 2020.    Medication Adjustments/Labs and Tests Ordered: Current medicines are reviewed at length with the patient today.  Concerns regarding medicines are outlined above.  Orders Placed This Encounter  Procedures  . Lipid panel  . EKG 12-Lead   No orders of the defined types were placed in this encounter.   Signed, Ledora Bottcher, PA  11/07/2018 9:02 AM    [place]

## 2018-11-07 ENCOUNTER — Other Ambulatory Visit (HOSPITAL_COMMUNITY)
Admission: RE | Admit: 2018-11-07 | Discharge: 2018-11-07 | Disposition: A | Discharge status: Home / Self Care | Payer: Medicare Other | Source: Ambulatory Visit | Attending: Orthopedic Surgery | Admitting: Orthopedic Surgery

## 2018-11-07 ENCOUNTER — Encounter: Payer: Self-pay | Admitting: Physician Assistant

## 2018-11-07 ENCOUNTER — Other Ambulatory Visit: Payer: Self-pay

## 2018-11-07 ENCOUNTER — Ambulatory Visit: Payer: Medicare Other | Admitting: Physician Assistant

## 2018-11-07 VITALS — BP 131/71 | HR 60 | Ht 62.0 in | Wt 209.0 lb

## 2018-11-07 DIAGNOSIS — I5032 Chronic diastolic (congestive) heart failure: Secondary | ICD-10-CM

## 2018-11-07 DIAGNOSIS — Z1159 Encounter for screening for other viral diseases: Secondary | ICD-10-CM | POA: Diagnosis not present

## 2018-11-07 DIAGNOSIS — E785 Hyperlipidemia, unspecified: Secondary | ICD-10-CM

## 2018-11-07 DIAGNOSIS — I10 Essential (primary) hypertension: Secondary | ICD-10-CM

## 2018-11-07 DIAGNOSIS — Z01818 Encounter for other preprocedural examination: Secondary | ICD-10-CM | POA: Diagnosis not present

## 2018-11-07 LAB — LIPID PANEL
Chol/HDL Ratio: 2.5 ratio (ref 0.0–4.4)
Cholesterol, Total: 210 mg/dL — ABNORMAL HIGH (ref 100–199)
HDL: 84 mg/dL (ref >39)
LDL Calculated: 114 mg/dL — ABNORMAL HIGH (ref 0–99)
Triglycerides: 61 mg/dL (ref 0–149)
VLDL Cholesterol Cal: 12 mg/dL (ref 5–40)

## 2018-11-07 LAB — SARS CORONAVIRUS 2 (TAT 6-24 HRS): SARS Coronavirus 2: NEGATIVE

## 2018-11-07 NOTE — Patient Instructions (Signed)
Medication Instructions:  Your physician recommends that you continue on your current medications as directed. Please refer to the Current Medication list given to you today.  If you need a refill on your cardiac medications before your next appointment, please call your pharmacy.   Lab work: Your physician recommends that you return for a FASTING lipid profile this morning.  If you have labs (blood work) drawn today and your tests are completely normal, you will receive your results only by: Marland Kitchen MyChart Message (if you have MyChart) OR . A paper copy in the mail If you have any lab test that is abnormal or we need to change your treatment, we will call you to review the results.   Follow-Up: At [facility], you and your health needs are our priority.  As part of our continuing mission to provide you with exceptional heart care, we have created designated Provider Care Teams.  These Care Teams include your primary Cardiologist (physician) and Advanced Practice Providers (APPs -  Physician Assistants and Nurse Practitioners) who all work together to provide you with the care you need, when you need it. . Please keep your scheduled appointment with Dr. Sallyanne Kuster on Wednesday, 12/24/18 at 8:40 AM.

## 2018-11-07 NOTE — Telephone Encounter (Signed)
Pt seen Angie Duke PA-C today for surgical clearance

## 2018-11-11 ENCOUNTER — Ambulatory Visit (HOSPITAL_BASED_OUTPATIENT_CLINIC_OR_DEPARTMENT_OTHER): Payer: Medicare Other | Admitting: Anesthesiology

## 2018-11-11 ENCOUNTER — Ambulatory Visit (HOSPITAL_BASED_OUTPATIENT_CLINIC_OR_DEPARTMENT_OTHER)
Admission: RE | Admit: 2018-11-11 | Discharge: 2018-11-11 | Disposition: A | Discharge status: Home / Self Care | Payer: Medicare Other | Attending: Orthopedic Surgery | Admitting: Orthopedic Surgery

## 2018-11-11 ENCOUNTER — Other Ambulatory Visit: Payer: Self-pay

## 2018-11-11 ENCOUNTER — Encounter (HOSPITAL_BASED_OUTPATIENT_CLINIC_OR_DEPARTMENT_OTHER)
Admission: RE | Disposition: A | Discharge status: Home / Self Care | Payer: Self-pay | Source: Home / Self Care | Attending: Orthopedic Surgery

## 2018-11-11 ENCOUNTER — Encounter (HOSPITAL_BASED_OUTPATIENT_CLINIC_OR_DEPARTMENT_OTHER): Payer: Self-pay

## 2018-11-11 ENCOUNTER — Telehealth (INDEPENDENT_AMBULATORY_CARE_PROVIDER_SITE_OTHER): Payer: Medicare Other | Admitting: Bariatrics

## 2018-11-11 DIAGNOSIS — Z86718 Personal history of other venous thrombosis and embolism: Secondary | ICD-10-CM | POA: Insufficient documentation

## 2018-11-11 DIAGNOSIS — Z7982 Long term (current) use of aspirin: Secondary | ICD-10-CM | POA: Diagnosis not present

## 2018-11-11 DIAGNOSIS — M65332 Trigger finger, left middle finger: Secondary | ICD-10-CM | POA: Insufficient documentation

## 2018-11-11 DIAGNOSIS — M65841 Other synovitis and tenosynovitis, right hand: Secondary | ICD-10-CM | POA: Diagnosis not present

## 2018-11-11 DIAGNOSIS — I10 Essential (primary) hypertension: Secondary | ICD-10-CM | POA: Diagnosis not present

## 2018-11-11 DIAGNOSIS — Z9884 Bariatric surgery status: Secondary | ICD-10-CM | POA: Diagnosis not present

## 2018-11-11 DIAGNOSIS — Z9221 Personal history of antineoplastic chemotherapy: Secondary | ICD-10-CM | POA: Diagnosis not present

## 2018-11-11 DIAGNOSIS — K5900 Constipation, unspecified: Secondary | ICD-10-CM | POA: Insufficient documentation

## 2018-11-11 DIAGNOSIS — G4730 Sleep apnea, unspecified: Secondary | ICD-10-CM | POA: Diagnosis not present

## 2018-11-11 DIAGNOSIS — M65341 Trigger finger, right ring finger: Secondary | ICD-10-CM | POA: Diagnosis not present

## 2018-11-11 DIAGNOSIS — C50912 Malignant neoplasm of unspecified site of left female breast: Secondary | ICD-10-CM | POA: Diagnosis not present

## 2018-11-11 DIAGNOSIS — M65342 Trigger finger, left ring finger: Secondary | ICD-10-CM | POA: Insufficient documentation

## 2018-11-11 DIAGNOSIS — J45909 Unspecified asthma, uncomplicated: Secondary | ICD-10-CM | POA: Diagnosis not present

## 2018-11-11 DIAGNOSIS — Z6839 Body mass index (BMI) 39.0-39.9, adult: Secondary | ICD-10-CM | POA: Diagnosis not present

## 2018-11-11 DIAGNOSIS — Z86711 Personal history of pulmonary embolism: Secondary | ICD-10-CM | POA: Insufficient documentation

## 2018-11-11 DIAGNOSIS — Z87891 Personal history of nicotine dependence: Secondary | ICD-10-CM | POA: Diagnosis not present

## 2018-11-11 DIAGNOSIS — I69351 Hemiplegia and hemiparesis following cerebral infarction affecting right dominant side: Secondary | ICD-10-CM | POA: Diagnosis not present

## 2018-11-11 DIAGNOSIS — Z79811 Long term (current) use of aromatase inhibitors: Secondary | ICD-10-CM | POA: Diagnosis not present

## 2018-11-11 DIAGNOSIS — Z923 Personal history of irradiation: Secondary | ICD-10-CM | POA: Insufficient documentation

## 2018-11-11 DIAGNOSIS — E669 Obesity, unspecified: Secondary | ICD-10-CM | POA: Diagnosis not present

## 2018-11-11 DIAGNOSIS — I69328 Other speech and language deficits following cerebral infarction: Secondary | ICD-10-CM | POA: Diagnosis not present

## 2018-11-11 DIAGNOSIS — Z79899 Other long term (current) drug therapy: Secondary | ICD-10-CM | POA: Insufficient documentation

## 2018-11-11 DIAGNOSIS — I739 Peripheral vascular disease, unspecified: Secondary | ICD-10-CM | POA: Diagnosis not present

## 2018-11-11 DIAGNOSIS — M659 Synovitis and tenosynovitis, unspecified: Secondary | ICD-10-CM | POA: Diagnosis not present

## 2018-11-11 DIAGNOSIS — C50512 Malignant neoplasm of lower-outer quadrant of left female breast: Secondary | ICD-10-CM | POA: Diagnosis not present

## 2018-11-11 DIAGNOSIS — M65311 Trigger thumb, right thumb: Secondary | ICD-10-CM | POA: Insufficient documentation

## 2018-11-11 HISTORY — PX: TRIGGER FINGER RELEASE: SHX641

## 2018-11-11 SURGERY — RELEASE, A1 PULLEY, FOR TRIGGER FINGER
Anesthesia: General | Site: Hand | Laterality: Right

## 2018-11-11 MED ORDER — HYDROCODONE-ACETAMINOPHEN 5-325 MG PO TABS: 1.0000 | ORAL_TABLET | Freq: Four times a day (QID) | ORAL | 0 refills | Status: DC | PRN

## 2018-11-11 MED ORDER — MIDAZOLAM HCL 2 MG/2ML IJ SOLN: 1.0000 mg | INTRAMUSCULAR | Status: DC | PRN

## 2018-11-11 MED ORDER — 0.9 % SODIUM CHLORIDE (POUR BTL) OPTIME
TOPICAL | Status: DC | PRN
  Administered 2018-11-11: 100 mL

## 2018-11-11 MED ORDER — DEXAMETHASONE SODIUM PHOSPHATE 10 MG/ML IJ SOLN: INTRAMUSCULAR | Status: DC | PRN

## 2018-11-11 MED ORDER — LACTATED RINGERS IV SOLN: INTRAVENOUS | Status: DC

## 2018-11-11 MED ORDER — CEFAZOLIN SODIUM-DEXTROSE 2-4 GM/100ML-% IV SOLN: 2.0000 g | INTRAVENOUS | Status: DC

## 2018-11-11 MED ORDER — CHLORHEXIDINE GLUCONATE 4 % EX LIQD: 60.0000 mL | Freq: Once | CUTANEOUS | Status: DC

## 2018-11-11 MED ORDER — PROPOFOL 10 MG/ML IV BOLUS
INTRAVENOUS | Status: DC | PRN
  Administered 2018-11-11: 200 mg via INTRAVENOUS

## 2018-11-11 MED ORDER — CEFAZOLIN SODIUM-DEXTROSE 2-4 GM/100ML-% IV SOLN
INTRAVENOUS | Status: AC
  Filled 2018-11-11: qty 100

## 2018-11-11 MED ORDER — FENTANYL CITRATE (PF) 100 MCG/2ML IJ SOLN
50.0000 ug | INTRAMUSCULAR | Status: DC | PRN
  Administered 2018-11-11 (×2): 50 ug via INTRAVENOUS

## 2018-11-11 MED ORDER — BUPIVACAINE HCL (PF) 0.25 % IJ SOLN
INTRAMUSCULAR | Status: DC | PRN
  Administered 2018-11-11: 9 mL

## 2018-11-11 MED ORDER — MIDAZOLAM HCL 2 MG/2ML IJ SOLN
INTRAMUSCULAR | Status: AC
  Filled 2018-11-11: qty 2

## 2018-11-11 MED ORDER — MEPERIDINE HCL 25 MG/ML IJ SOLN: 6.2500 mg | INTRAMUSCULAR | Status: DC | PRN

## 2018-11-11 MED ORDER — METOCLOPRAMIDE HCL 5 MG/ML IJ SOLN: 10.0000 mg | Freq: Once | INTRAMUSCULAR | Status: DC | PRN

## 2018-11-11 MED ORDER — LACTATED RINGERS IV SOLN
INTRAVENOUS | Status: DC
  Administered 2018-11-11: 11:00:00 via INTRAVENOUS

## 2018-11-11 MED ORDER — FENTANYL CITRATE (PF) 100 MCG/2ML IJ SOLN: 25.0000 ug | INTRAMUSCULAR | Status: DC | PRN

## 2018-11-11 MED ORDER — FENTANYL CITRATE (PF) 100 MCG/2ML IJ SOLN
INTRAMUSCULAR | Status: AC
  Filled 2018-11-11: qty 2

## 2018-11-11 MED ORDER — ONDANSETRON HCL 4 MG/2ML IJ SOLN
INTRAMUSCULAR | Status: DC | PRN
  Administered 2018-11-11: 4 mg via INTRAVENOUS

## 2018-11-11 MED ORDER — SCOPOLAMINE 1 MG/3DAYS TD PT72: 1.0000 | MEDICATED_PATCH | Freq: Once | TRANSDERMAL | Status: DC

## 2018-11-11 MED FILL — HYDROCODON-APAP 5-325: 5-325 | 7 days supply | Qty: 30 | Fill #0

## 2018-11-11 SURGICAL SUPPLY — 38 items
APL PRP STRL LF DISP 70% ISPRP (MISCELLANEOUS) ×1
BLADE SURG 15 STRL LF DISP TIS (BLADE) ×1 IMPLANT
BLADE SURG 15 STRL SS (BLADE) ×2
BNDG CMPR 9X4 STRL LF SNTH (GAUZE/BANDAGES/DRESSINGS) ×1
BNDG COHESIVE 2X5 TAN STRL LF (GAUZE/BANDAGES/DRESSINGS) ×2 IMPLANT
BNDG ESMARK 4X9 LF (GAUZE/BANDAGES/DRESSINGS) ×1 IMPLANT
CHLORAPREP W/TINT 26 (MISCELLANEOUS) ×2 IMPLANT
CORD BIPOLAR FORCEPS 12FT (ELECTRODE) IMPLANT
COVER BACK TABLE REUSABLE LG (DRAPES) ×2 IMPLANT
COVER MAYO STAND REUSABLE (DRAPES) ×2 IMPLANT
COVER WAND RF STERILE (DRAPES) IMPLANT
CUFF TOURN SGL QUICK 18X4 (TOURNIQUET CUFF) IMPLANT
CUFF TOURN SGL QUICK 24 (TOURNIQUET CUFF) ×2
CUFF TRNQT CYL 24X4X16.5-23 (TOURNIQUET CUFF) IMPLANT
DECANTER SPIKE VIAL GLASS SM (MISCELLANEOUS) IMPLANT
DRAPE EXTREMITY T 121X128X90 (DISPOSABLE) ×2 IMPLANT
DRAPE SURG 17X23 STRL (DRAPES) ×2 IMPLANT
GAUZE SPONGE 4X4 12PLY STRL (GAUZE/BANDAGES/DRESSINGS) ×2 IMPLANT
GAUZE XEROFORM 1X8 LF (GAUZE/BANDAGES/DRESSINGS) ×2 IMPLANT
GLOVE BIOGEL PI IND STRL 7.0 (GLOVE) IMPLANT
GLOVE BIOGEL PI IND STRL 8.5 (GLOVE) ×1 IMPLANT
GLOVE BIOGEL PI INDICATOR 7.0 (GLOVE) ×2
GLOVE BIOGEL PI INDICATOR 8.5 (GLOVE) ×1
GLOVE ECLIPSE 6.5 STRL STRAW (GLOVE) ×1 IMPLANT
GLOVE SURG ORTHO 8.0 STRL STRW (GLOVE) ×2 IMPLANT
GOWN STRL REUS W/ TWL LRG LVL3 (GOWN DISPOSABLE) ×1 IMPLANT
GOWN STRL REUS W/TWL LRG LVL3 (GOWN DISPOSABLE) ×2
GOWN STRL REUS W/TWL XL LVL3 (GOWN DISPOSABLE) ×2 IMPLANT
NDL PRECISIONGLIDE 27X1.5 (NEEDLE) ×1 IMPLANT
NEEDLE PRECISIONGLIDE 27X1.5 (NEEDLE) ×2 IMPLANT
NS IRRIG 1000ML POUR BTL (IV SOLUTION) ×2 IMPLANT
PACK BASIN DAY SURGERY FS (CUSTOM PROCEDURE TRAY) ×2 IMPLANT
STOCKINETTE 4X48 STRL (DRAPES) ×2 IMPLANT
SUT ETHILON 4 0 PS 2 18 (SUTURE) ×2 IMPLANT
SYR BULB 3OZ (MISCELLANEOUS) ×2 IMPLANT
SYR CONTROL 10ML LL (SYRINGE) ×2 IMPLANT
TOWEL GREEN STERILE FF (TOWEL DISPOSABLE) ×4 IMPLANT
UNDERPAD 30X30 (UNDERPADS AND DIAPERS) ×2 IMPLANT

## 2018-11-11 NOTE — H&P (Signed)
[name] is an 58 y.o. female.   Chief Complaint: catching right thumb and ring fingersHPI:[name] is a 58 year old female with triggering thumb and index finger right hand. She was last seen 07/09/2018 had an injection to each. She states that the index has settled the thumb continues to catch. States that she has begun having catching of her left middle and left ring fingers over the past several weeks. She has no history of injury. She complains of moderate pain especially in the mornings with the each of the digits. He is status post carpal tunnel releases. She is not complaining of any numbness or tingling at the present time. She has a history of arthritis no history of diabetes thyroid problems or gout. Family history is negative for diabetes thyroid problems and gout but positive for arthritis. He states nothing seems to make it better or worse and localized the pain over the metacarpal phalangeal joint volar aspects of each of those digits.   Past Medical History:  Diagnosis Date  . Anemia    since bypass  . Arthritis    osteoarthritis  . Asthma    states no asthma attack since 2002  . Back pain   . Breast cancer ([place]) 06/26/16 bx   left breast  . Breast cancer ([place])   . Chronic back pain   . Complication of anesthesia    states takes more than normal to put her to sleep  . Constipation   . Dental bridge present    upper  . Dental crowns present   . DVT of upper extremity (deep vein thrombosis) ([place])   . Fibromyalgia   . Genetic testing 09/19/2016   Ms. [name] underwent genetic counseling and testing for hereditary cancer syndromes on 08/21/2016. Her results were negative for mutations in all 46 genes analyzed by [facility]'s 46-gene Common Hereditary Cancers Panel. Genes analyzed include: APC, ATM, AXIN2, BARD1, BMPR1A, BRCA1, BRCA2, BRIP1, CDH1, CDKN2A, CHEK2, CTNNA1, DICER1, EPCAM, GREM1, HOXB13, KIT, MEN1, MLH1, MSH2, MSH3, MSH6, MUTYH, NBN,   . Headache(784.0)    migraines   . History of blood transfusion 06/2005  . History of gallstones   . History of pneumonia   . History of shingles 07/2011  . History of shingles   . HTN (hypertension)   . Itching   . Joint pain   . Muscle weakness   . Neuropathy   . Normal coronary arteries 2003  . Osteoarthropathy   . Personal history of chemotherapy 2018  . Personal history of radiation therapy 2018  . PFO (patent foramen ovale)   . Presence of inferior vena cava filter   . Pulmonary embolism ([place])   . Sleep apnea    used CPAP until after bypass surg.  . Status post gastric bypass for obesity   . Stomach pain   . Stroke [facility]) 12/2002   right-sided weakness  . Urinary incontinence     Past Surgical History:  Procedure Laterality Date  . ABDOMINAL HYSTERECTOMY  11/1997   complete  . ANTERIOR CERVICAL DECOMP/DISCECTOMY FUSION  02/05/2005   C5-6  . APPENDECTOMY  10/22/2008   laparoscopic  . BREAST LUMPECTOMY Left 07/2016  . BREAST LUMPECTOMY WITH RADIOACTIVE SEED AND SENTINEL LYMPH NODE BIOPSY Left 07/26/2016   Procedure: BREAST LUMPECTOMY WITH RADIOACTIVE SEED AND SENTINEL LYMPH NODE BIOPSY;  Surgeon: Erroll Luna, MD;  Location: [place];  Service: General;  Laterality: Left;  . BUNIONECTOMY  05/1980   both feet  . BUNIONECTOMY  08/2011  left foot  . CARDIAC CATHETERIZATION  03/04/2002  . CARPAL TUNNEL RELEASE  06/21/2009   right  . CARPAL TUNNEL RELEASE     left hand  . CARPAL TUNNEL RELEASE  10/03/2011   Procedure: CARPAL TUNNEL RELEASE;  Surgeon: Wynonia Sours, MD;  Location: [place];  Service: Orthopedics;  Laterality: Right;  CARPAL TUNNEL WITH HYPOTHENAR FAT PAD TRANSFER  . CERVICAL SPINE SURGERY  01/2005   titanium plate implanted  . CHOLECYSTECTOMY  1990  . ELBOW SURGERY  08/09/2004   decompression ulnar nerve right elbow  . ENTEROLYSIS  10/22/2008   laparoscopic abd. enterolysis  . GASTRIC ROUX-EN-Y  2009  . HEEL SPUR SURGERY  08/1997   left  . HEMORRHOID SURGERY   03/1993  . LAPAROSCOPIC LYSIS INTESTINAL ADHESIONS  02/14/2000  . NAILBED REPAIR  01/10/2005; 08/2011   exc. matrix bilat. great toe  . OTHER SURGICAL HISTORY  12/1986   pt states that she had surgery to unclog her fallopean tubes  . PORTACATH PLACEMENT N/A 09/24/2016   Procedure: INSERTION PORT-A-CATH WITH [name];  Surgeon: Erroll Luna, MD;  Location: [place];  Service: General;  Laterality: N/A;  . SHOULDER SURGERY     bilat. - (left:  06/2005)  . TONSILLECTOMY  07/1995  . TRIGGER FINGER RELEASE  04/25/2006   decompression A-1 pulley left thumb  . UTERINE FIBROID SURGERY  12/95, 7/96   x2  . VENA CAVA FILTER PLACEMENT  2009   during Roux-en-Y surg.    Family History  Problem Relation Age of Onset  . Breast cancer Paternal Aunt 25  . Cervical cancer Paternal Grandmother 40       d.40s  . Ovarian cancer Maternal Grandmother 23       d.23  . Colon polyps Father   . Diabetes Father        borderline  . Prostate cancer Father   . High blood pressure Father   . Hypertension Mother   . Hypertension Other   . Breast cancer Sister 89       treated with neoadjuvant chemo/radiation and lumpectomy   Social History:  reports that she has quit smoking. She has never used smokeless tobacco. She reports that she does not drink alcohol or use drugs.  Allergies:  Allergies  Allergen Reactions  . Aspirin Other (See Comments)    ESOPHAGITIS  . Oxycodone Hcl Diarrhea and Nausea And Vomiting  . Propoxyphene N-Acetaminophen Diarrhea and Nausea And Vomiting  . Tramadol Other (See Comments)    Cause migraines  . Adhesive [Tape] Rash and Other (See Comments)    Pulls skin off - please use paper tape  . Prednisone Rash    No medications prior to admission.    No results found for this or any previous visit (from the past 48 hour(s)).  No results found.   Pertinent items are noted in HPI.  Height 5' 2"  (1.575 m), weight 96 kg.  General appearance: alert, cooperative and appears stated  age Head: Normocephalic, without obvious abnormality Neck: no JVD Resp: clear to auscultation bilaterally Cardio: regular rate and rhythm, S1, S2 normal, no murmur, click, rub or gallop GI: soft, non-tender; bowel sounds normal; no masses,  no organomegaly Extremities: catching right thumb an ring fingers Pulses: 2+ and symmetric Skin: Skin color, texture, turgor normal. No rashes or lesions Neurologic: Grossly normal Incision/Wound: na  Assessment/Plan Assessment:  1. Trigger finger of right thumb  2. Trigger ring finger of right hand  3.  Trigger middle finger of left hand  4. Trigger ring finger of left hand    Plan: We have discussed surgical release of the A1 pulley of the right thumb and would recommend release of the ring finger at the same time on the right side. After thorough prep and informed consent the left middle and left ring fingers are each injected with Celestone Xylocaine. She is advised use ice for swelling Tylenol ibuprofen for pain possibility of numbness and tingling for the next 3 to 4 hours Band-Aid splints at night. She is scheduled for the thumb and ring finger on her right side as an outpatient under regional anesthesia for decompression of the A1 pulleys. Pre-peri-and postoperative course been discussed along with risk complications. She is aware that there is no guarantee to the surgery possibility of infection recurrence injury to arteries nerves tendons incomplete relief symptoms dystrophy.     Daryll Brod 11/11/2018, 9:33 AM

## 2018-11-11 NOTE — Brief Op Note (Signed)
11/11/2018  11:44 AM  PATIENT:  [name]  58 y.o. female  PRE-OPERATIVE DIAGNOSIS:  STENOSING TENOSYNOVITIS RIGHT THUMB, RIGHT RING FINGER  POST-OPERATIVE DIAGNOSIS:  STENOSING TENOSYNOVITIS RIGHT THUMB, RIGHT RING FINGER  PROCEDURE:  Procedure(s): RELEASE TRIGGER FINGER/A-1 PULLEY RIGHT THUMB, RIGHT RING FINGER (Right)  SURGEON:  Surgeon(s) and Role:    * Daryll Brod, MD - Primary  PHYSICIAN ASSISTANT:   ASSISTANTS: none   ANESTHESIA:   local and general  EBL:  8ml  BLOOD ADMINISTERED:none  DRAINS: none   LOCAL MEDICATIONS USED:  BUPIVICAINE   SPECIMEN:  No Specimen  DISPOSITION OF SPECIMEN:  N/A  COUNTS:  YES  TOURNIQUET:   Total Tourniquet Time Documented: area (Right) - 16 minutes Total: area (Right) - 16 minutes   DICTATION: .Dragon Dictation  PLAN OF CARE: Discharge to home after PACU  PATIENT DISPOSITION:  PACU - hemodynamically stable.

## 2018-11-11 NOTE — Anesthesia Postprocedure Evaluation (Signed)
Anesthesia Post Note  Patient: [name]  Procedure(s) Performed: RELEASE TRIGGER FINGER/A-1 PULLEY RIGHT THUMB, RIGHT RING FINGER (Right Hand)     Patient location during evaluation: PACU Anesthesia Type: General Level of consciousness: awake and alert Pain management: pain level controlled Vital Signs Assessment: post-procedure vital signs reviewed and stable Respiratory status: spontaneous breathing, nonlabored ventilation, respiratory function stable and patient connected to nasal cannula oxygen Cardiovascular status: blood pressure returned to baseline and stable Postop Assessment: no apparent nausea or vomiting Anesthetic complications: no    Last Vitals:  Vitals:   11/11/18 1215 11/11/18 1253  BP: (!) 146/92 (!) 160/78  Pulse: 69 64  Resp: (!) 9 18  Temp:  37.1 C  SpO2: 96% 95%    Last Pain:  Vitals:   11/11/18 1253  TempSrc:   PainSc: 2                  Montez Hageman

## 2018-11-11 NOTE — Transfer of Care (Signed)
Immediate Anesthesia Transfer of Care Note  Patient: [name]  Procedure(s) Performed: RELEASE TRIGGER FINGER/A-1 PULLEY RIGHT THUMB, RIGHT RING FINGER (Right Hand)  Patient Location: PACU  Anesthesia Type:General  Level of Consciousness: sedated  Airway & Oxygen Therapy: Patient Spontanous Breathing and Patient connected to face mask oxygen  Post-op Assessment: Report given to RN and Post -op Vital signs reviewed and stable  Post vital signs: Reviewed and stable  Last Vitals:  Vitals Value Taken Time  BP 148/93 11/11/18 1154  Temp    Pulse 74 11/11/18 1159  Resp 8 11/11/18 1159  SpO2 98 % 11/11/18 1159  Vitals shown include unvalidated device data.  Last Pain:  Vitals:   11/11/18 1038  TempSrc: Oral  PainSc: 0-No pain         Complications: No apparent anesthesia complications

## 2018-11-11 NOTE — Discharge Instructions (Addendum)

## 2018-11-11 NOTE — Anesthesia Procedure Notes (Signed)
Procedure Name: LMA Insertion Date/Time: 11/11/2018 11:18 AM Performed by: Willa Frater, CRNA Pre-anesthesia Checklist: Patient identified, Emergency Drugs available, Suction available and Patient being monitored Patient Re-evaluated:Patient Re-evaluated prior to induction Oxygen Delivery Method: Circle system utilized Preoxygenation: Pre-oxygenation with 100% oxygen Induction Type: IV induction Ventilation: Mask ventilation without difficulty LMA: LMA inserted LMA Size: 4.0 Number of attempts: 1 Airway Equipment and Method: Bite block Placement Confirmation: positive ETCO2 Tube secured with: Tape Dental Injury: Teeth and Oropharynx as per pre-operative assessment

## 2018-11-11 NOTE — Anesthesia Preprocedure Evaluation (Addendum)
Anesthesia Evaluation  Patient identified by MRN, date of birth, ID band Patient awake    Reviewed: Allergy & Precautions, NPO status , Patient's Chart, lab work & pertinent test results  History of Anesthesia Complications (+) history of anesthetic complications  Airway Mallampati: II  TM Distance: >3 FB Neck ROM: Full    Dental no notable dental hx. (+) Caps, Dental Advisory Given   Pulmonary asthma , sleep apnea , former smoker,    Pulmonary exam normal breath sounds clear to auscultation       Cardiovascular hypertension, Pt. on medications + Peripheral Vascular Disease  Normal cardiovascular exam Rhythm:Regular Rate:Normal     Neuro/Psych  Headaches,  Neuromuscular disease CVA, Residual Symptoms    GI/Hepatic negative GI ROS, Neg liver ROS,   Endo/Other  Obesity Left Breast Ca  Renal/GU negative Renal ROS  negative genitourinary   Musculoskeletal  (+) Arthritis , Osteoarthritis,  Fibromyalgia -  Abdominal (+) + obese,   Peds  Hematology negative hematology ROS (+) anemia ,   Anesthesia Other Findings Weakness Right leg and difficulty finding some words at times post CVA  Reproductive/Obstetrics                             Anesthesia Physical  Anesthesia Plan  ASA: III  Anesthesia Plan: General   Post-op Pain Management:    Induction: Intravenous  PONV Risk Score and Plan: 3 and Treatment may vary due to age or medical condition and Ondansetron  Airway Management Planned: LMA  Additional Equipment:   Intra-op Plan:   Post-operative Plan:   Informed Consent: I have reviewed the patients History and Physical, chart, labs and discussed the procedure including the risks, benefits and alternatives for the proposed anesthesia with the patient or authorized representative who has indicated his/her understanding and acceptance.     Dental advisory given  Plan Discussed  with: Anesthesiologist, CRNA and Surgeon  Anesthesia Plan Comments:        Anesthesia Quick Evaluation

## 2018-11-11 NOTE — Op Note (Signed)
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   ASSISTANT: none   ANESTHESIA:  General and Local   INTRAVENOUS FLUIDS:  Per anesthesia flow sheet.   ESTIMATED BLOOD LOSS:  Minimal.   COMPLICATIONS:  None.   SPECIMENS:  none   TOURNIQUET TIME:    Total Tourniquet Time Documented: area (Right) - 16 minutes Total: area (Right) - 16 minutes    DISPOSITION:  Stable to PACU.   INDICATIONS: Patient is a 58 year old female with a history of triggering of her right thumb triggering of the right ring finger.  She is undergone 2 injections of the thumb without relief.  She is admitted for release of the A1 pulley of the right thumb right ring finger.  Pre-peri-and postoperative course been discussed along with risks and complications.  She is aware that there is no guarantee to the surgery the possibility of infection recurrence injury to arteries nerves tendons complete relief of symptoms and dystrophy.  In the preoperative area the patient seen extremity marked by both patient and surgeon antibiotic given  OPERATIVE COURSE: Patient is brought to the operating room where general anesthetic was carried out without difficulty under the direction of the anesthesia department due to the inability to find a being on her operative side.  She was prepped and draped supine position with the right arm free.  A three-minute dry time was allowed timeout taken to confirm patient procedure after ChloraPrep ChloraPrep prep.  A oblique incision was made over the A1 pulley of the right ring finger carried down through subcutaneous  tissue.  Bleeders were electrocauterized as necessary with bipolar.  The dissection carried down to the A1 pulley this found to be thickened.  The retractors were placed retracting neurovascular bundles radially and ulnarly.  The A1 pulley was released on its radial aspect a small incision was made centrally and A2.  Tenosynovial tissue proximally was separated the 2 tendons were then separated breaking any adhesions between the 2 tendons.  The finger was splinted placed through full passive range of motion no further triggering was noted.  Wound was irrigated and closed with interrupted 4-0 nylon sutures.  A transverse incision was then made over the A1 pulley of the right thumb.  This carried down through subcutaneous tissue.  Neurovascular bundles were identified radially and ulnarly.  A markedly thickened A1 pulley was immediately encountered.  This was released on its radial aspect after placing retractors to protect the neurovascular structures.  The oblique pulley was left intact.  An area compression to the tendon was immediately apparent.  The tenosynovial tissue proximally was separated with blunt dissection.  The thumb was placed in a full range of motion no further triggering was noted.  The wound was copiously irrigated with saline and closed interrupted 4-0 nylon sutures.  Local infiltration was then given to each of the incisions with quarter percent bupivacaine without epinephrine approximately 8 total cc was used.  Sterile compressive dressing with the fingers and thumb free was applied and deflation of the tourniquet all fingers immediately pink.  She was taken to the recovery room for  observation in satisfactory condition.  She will be discharged home to return the hand center [facility] in 1 week on Tylenol ibuprofen for pain with Norco 5/325's as a backup for breakthrough.   Daryll Brod, MD Electronically signed, 11/11/18

## 2018-11-12 ENCOUNTER — Encounter (HOSPITAL_BASED_OUTPATIENT_CLINIC_OR_DEPARTMENT_OTHER): Payer: Self-pay | Admitting: Orthopedic Surgery

## 2018-11-12 NOTE — Addendum Note (Signed)
Addendum  created 11/12/18 1042 by Tawni Millers, CRNA   Charge Capture section accepted

## 2018-11-14 DIAGNOSIS — R69 Illness, unspecified: Secondary | ICD-10-CM | POA: Diagnosis not present

## 2018-11-18 ENCOUNTER — Encounter (INDEPENDENT_AMBULATORY_CARE_PROVIDER_SITE_OTHER): Payer: Self-pay | Admitting: Bariatrics

## 2018-11-18 ENCOUNTER — Other Ambulatory Visit: Payer: Self-pay

## 2018-11-18 ENCOUNTER — Telehealth (INDEPENDENT_AMBULATORY_CARE_PROVIDER_SITE_OTHER): Payer: Medicare Other | Admitting: Bariatrics

## 2018-11-18 DIAGNOSIS — Z6835 Body mass index (BMI) 35.0-35.9, adult: Secondary | ICD-10-CM

## 2018-11-18 DIAGNOSIS — E559 Vitamin D deficiency, unspecified: Secondary | ICD-10-CM | POA: Diagnosis not present

## 2018-11-18 DIAGNOSIS — R7303 Prediabetes: Secondary | ICD-10-CM | POA: Diagnosis not present

## 2018-11-19 NOTE — Progress Notes (Signed)
Office: [phone]  /  Fax: [phone] TeleHealth Visit:  [name] has verbally consented to this TeleHealth visit today. The patient is located at home, the provider is located at the [employer] [facility] office. The participants in this visit include the listed provider and patient and any and all parties involved. The visit was conducted today via WebEx.  HPI:   Chief Complaint: OBESITY [name] is here to discuss her progress with her obesity treatment plan. She is on the Category 2 plan +100 calories and she is following her eating plan approximately 70 % of the time. She states she is exercising 0 minutes 0 times per week. [name] states that she is down 2 pounds (weight 209 lbs). She is constipated and she is taking Linzess via the GI. We were unable to weigh the patient today for this TeleHealth visit. She feels as if she has lost weight since her last visit. She has gained 5 lbs since starting treatment with [name].  Vitamin D deficiency [name] has a diagnosis of vitamin D deficiency. Her last vitamin D level was at 24.3 She is currently taking vit D and denies nausea, vomiting or muscle weakness.  Pre-Diabetes [name] has a diagnosis of prediabetes based on her elevated Hgb A1c and was informed this puts her at greater risk of developing diabetes. She is not taking medications currently and continues to work on diet and exercise to decrease risk of diabetes. She denies nausea or hypoglycemia.  ASSESSMENT AND PLAN:  Prediabetes  Vitamin D deficiency  Class 2 severe obesity with serious comorbidity and body mass index (BMI) of 35.0 to 35.9 in adult, unspecified obesity type ([place])  PLAN:  Vitamin D Deficiency Dedra was informed that low vitamin D levels contributes to fatigue and are associated with obesity, breast, and colon cancer. She agrees to continue to take prescription Vit D _0 ,000 IU every week and will follow up for routine testing of vitamin D,  at least 2-3 times per year. She was informed of the risk of over-replacement of vitamin D and agrees to not increase her dose unless she discusses this with [name] first.  Pre-Diabetes [name] will continue to work on weight loss, increasing activity, increasing lean protein and decreasing simple carbohydrates in her diet to help decrease the risk of diabetes. She was informed that eating too many simple carbohydrates or too many calories at one sitting increases the likelihood of GI side effects. [name] agreed to follow up with [name] as directed to monitor her progress.  Obesity [name] is currently in the action stage of change. As such, her goal is to continue with weight loss efforts She has agreed to follow the Category 2 plan [name] will continue to be active for weight loss and overall health benefits. We discussed the following Behavioral Modification Strategies today: planning for success, increase H2O intake, no skipping meals, keeping healthy foods in the home, increasing lean protein intake, decreasing simple carbohydrates, increasing vegetables, decrease eating out and work on meal planning and easy cooking plans  [name] has agreed to follow up with our clinic in 4 weeks. She was informed of the importance of frequent follow up visits to maximize her success with intensive lifestyle modifications for her multiple health conditions.  ALLERGIES: Allergies  Allergen Reactions  . Aspirin Other (See Comments)    ESOPHAGITIS  . Oxycodone Hcl Diarrhea and Nausea And Vomiting  . Propoxyphene N-Acetaminophen Diarrhea and Nausea And Vomiting  . Tramadol Other (See Comments)  Cause migraines  . Adhesive [Tape] Rash and Other (See Comments)    Pulls skin off - please use paper tape  . Prednisone Rash    MEDICATIONS: Current Outpatient Medications on File Prior to Visit  Medication Sig Dispense Refill  . albuterol (PROAIR HFA) 108 (90 Base) MCG/ACT inhaler Inhale 2 puffs into the  lungs every 6 (six) hours as needed for wheezing or shortness of breath.    Marland Kitchen aspirin EC 81 MG tablet Take 81 mg by mouth daily.    . Calcium Citrate-Vitamin D (CALCIUM CITRATE +D PO) Take 2 tablets by mouth daily. Calcium 600 mg     . DULoxetine (CYMBALTA) 60 MG capsule Take 60 mg by mouth daily.    Marland Kitchen HYDROcodone-acetaminophen (NORCO) 5-325 MG tablet Take 1 tablet by mouth every 6 (six) hours as needed for moderate pain. 30 tablet 0  . hydrOXYzine (ATARAX/VISTARIL) 25 MG tablet Take 25 mg by mouth 3 (three) times daily as needed.    Marland Kitchen letrozole (FEMARA) 2.5 MG tablet Take 1 tablet (2.5 mg total) by mouth daily. 90 tablet 3  . linaclotide (LINZESS) 145 MCG CAPS capsule Take 145 mcg by mouth daily before breakfast.    . lisinopril (PRINIVIL,ZESTRIL) 20 MG tablet Take 20 mg by mouth daily.    . mometasone (ELOCON) 0.1 % cream Apply 1 application topically daily as needed (rash - summer eczema). Apply to arms    . Multiple Vitamin (MULTIVITAMIN WITH MINERALS) TABS tablet Take 1 tablet by mouth at bedtime.    . silver sulfADIAZINE (SILVADENE) 1 % cream Apply 1 application topically 2 (two) times daily as needed (stomach tears). 50 g 0  . solifenacin (VESICARE) 10 MG tablet     . valACYclovir (VALTREX) 1000 MG tablet Take 1 tablet (1,000 mg total) by mouth 3 (three) times daily. 21 tablet 0  . Vitamin D, Ergocalciferol, (DRISDOL) 1.25 MG (50000 UT) CAPS capsule TAKE 1 CAPSULE BY MOUTH BY MOUTH EVERY 7 DAYS 4 capsule 0   No current facility-administered medications on file prior to visit.     PAST MEDICAL HISTORY: Past Medical History:  Diagnosis Date  . Anemia    since bypass  . Arthritis    osteoarthritis  . Asthma    states no asthma attack since 2002  . Back pain   . Breast cancer ([place]) 06/26/16 bx   left breast  . Breast cancer ([place])   . Chronic back pain   . Complication of anesthesia    states takes more than normal to put her to sleep  . Constipation   . Dental bridge present     upper  . Dental crowns present   . DVT of upper extremity (deep vein thrombosis) ([place])   . Fibromyalgia   . Genetic testing 09/19/2016   Ms. Borin underwent genetic counseling and testing for hereditary cancer syndromes on 08/21/2016. Her results were negative for mutations in all 46 genes analyzed by [facility]'s 46-gene Common Hereditary Cancers Panel. Genes analyzed include: APC, ATM, AXIN2, BARD1, BMPR1A, BRCA1, BRCA2, BRIP1, CDH1, CDKN2A, CHEK2, CTNNA1, DICER1, EPCAM, GREM1, HOXB13, KIT, MEN1, MLH1, MSH2, MSH3, MSH6, MUTYH, NBN,   . Headache(784.0)    migraines  . History of blood transfusion 06/2005  . History of gallstones   . History of pneumonia   . History of shingles 07/2011  . History of shingles   . HTN (hypertension)   . Itching   . Joint pain   . Muscle weakness   . Neuropathy   .  Normal coronary arteries 2003  . Osteoarthropathy   . Personal history of chemotherapy 2018  . Personal history of radiation therapy 2018  . PFO (patent foramen ovale)   . Presence of inferior vena cava filter   . Pulmonary embolism ([place])   . Sleep apnea    used CPAP until after bypass surg.  . Status post gastric bypass for obesity   . Stomach pain   . Stroke [facility]) 12/2002   right-sided weakness  . Urinary incontinence     PAST SURGICAL HISTORY: Past Surgical History:  Procedure Laterality Date  . ABDOMINAL HYSTERECTOMY  11/1997   complete  . ANTERIOR CERVICAL DECOMP/DISCECTOMY FUSION  02/05/2005   C5-6  . APPENDECTOMY  10/22/2008   laparoscopic  . BREAST LUMPECTOMY Left 07/2016  . BREAST LUMPECTOMY WITH RADIOACTIVE SEED AND SENTINEL LYMPH NODE BIOPSY Left 07/26/2016   Procedure: BREAST LUMPECTOMY WITH RADIOACTIVE SEED AND SENTINEL LYMPH NODE BIOPSY;  Surgeon: Erroll Luna, MD;  Location: [place];  Service: General;  Laterality: Left;  . BUNIONECTOMY  05/1980   both feet  . BUNIONECTOMY  08/2011   left foot  . CARDIAC CATHETERIZATION  03/04/2002  . CARPAL TUNNEL RELEASE   06/21/2009   right  . CARPAL TUNNEL RELEASE     left hand  . CARPAL TUNNEL RELEASE  10/03/2011   Procedure: CARPAL TUNNEL RELEASE;  Surgeon: Wynonia Sours, MD;  Location: [place];  Service: Orthopedics;  Laterality: Right;  CARPAL TUNNEL WITH HYPOTHENAR FAT PAD TRANSFER  . CERVICAL SPINE SURGERY  01/2005   titanium plate implanted  . CHOLECYSTECTOMY  1990  . ELBOW SURGERY  08/09/2004   decompression ulnar nerve right elbow  . ENTEROLYSIS  10/22/2008   laparoscopic abd. enterolysis  . GASTRIC ROUX-EN-Y  2009  . HEEL SPUR SURGERY  08/1997   left  . HEMORRHOID SURGERY  03/1993  . LAPAROSCOPIC LYSIS INTESTINAL ADHESIONS  02/14/2000  . NAILBED REPAIR  01/10/2005; 08/2011   exc. matrix bilat. great toe  . OTHER SURGICAL HISTORY  12/1986   pt states that she had surgery to unclog her fallopean tubes  . PORTACATH PLACEMENT N/A 09/24/2016   Procedure: INSERTION PORT-A-CATH WITH [name];  Surgeon: Erroll Luna, MD;  Location: [place];  Service: General;  Laterality: N/A;  . SHOULDER SURGERY     bilat. - (left:  06/2005)  . TONSILLECTOMY  07/1995  . TRIGGER FINGER RELEASE  04/25/2006   decompression A-1 pulley left thumb  . TRIGGER FINGER RELEASE Right 11/11/2018   Procedure: RELEASE TRIGGER FINGER/A-1 PULLEY RIGHT THUMB, RIGHT RING FINGER;  Surgeon: Daryll Brod, MD;  Location: [place];  Service: Orthopedics;  Laterality: Right;  . UTERINE FIBROID SURGERY  12/95, 7/96   x2  . VENA CAVA FILTER PLACEMENT  2009   during Roux-en-Y surg.    SOCIAL HISTORY: Social History   Tobacco Use  . Smoking status: Former Research scientist (life sciences)  . Smokeless tobacco: Never Used  . Tobacco comment: quit smoking 08/1989  Substance Use Topics  . Alcohol use: No  . Drug use: No    FAMILY HISTORY: Family History  Problem Relation Age of Onset  . Breast cancer Paternal Aunt 38  . Cervical cancer Paternal Grandmother 50       d.40s  . Ovarian cancer Maternal Grandmother 23       d.23  . Colon  polyps Father   . Diabetes Father        borderline  . Prostate  cancer Father   . High blood pressure Father   . Hypertension Mother   . Hypertension Other   . Breast cancer Sister 93       treated with neoadjuvant chemo/radiation and lumpectomy    ROS: Review of Systems  Constitutional: Positive for weight loss.  Gastrointestinal: Negative for nausea and vomiting.  Musculoskeletal:       Negative for muscle weakness  Endo/Heme/Allergies:       Negative for hypoglycemia    PHYSICAL EXAM: Pt in no acute distress  RECENT LABS AND TESTS: BMET    Component Value Date/Time   NA 138 06/10/2018 1329   NA 138 04/19/2017 1005   K 4.9 06/10/2018 1329   K 4.0 04/19/2017 1005   CL 100 06/10/2018 1329   CO2 22 06/10/2018 1329   CO2 25 04/19/2017 1005   GLUCOSE 87 06/10/2018 1329   GLUCOSE 80 11/26/2017 1544   GLUCOSE 87 04/19/2017 1005   BUN 11 06/10/2018 1329   BUN 12.0 04/19/2017 1005   CREATININE 0.80 06/10/2018 1329   CREATININE 0.77 11/26/2017 1544   CREATININE 0.7 04/19/2017 1005   CALCIUM 9.3 06/10/2018 1329   CALCIUM 8.9 04/19/2017 1005   GFRNONAA 83 06/10/2018 1329   GFRNONAA >60 11/26/2017 1544   GFRAA 95 06/10/2018 1329   GFRAA >60 11/26/2017 1544   Lab Results  Component Value Date   HGBA1C 5.7 (H) 06/10/2018   HGBA1C 5.8 (H) 02/18/2018   Lab Results  Component Value Date   INSULIN 4.9 02/18/2018   CBC    Component Value Date/Time   WBC 4.3 11/26/2017 1544   WBC 3.5 (L) 04/19/2017 1005   WBC 7.5 12/14/2016 0120   RBC 3.62 (L) 11/26/2017 1544   HGB 11.5 (L) 11/26/2017 1544   HGB 10.2 (L) 04/19/2017 1005   HCT 34.8 11/26/2017 1544   HCT 31.1 (L) 04/19/2017 1005   PLT 192 11/26/2017 1544   PLT 184 04/19/2017 1005   MCV 96.1 11/26/2017 1544   MCV 97.5 04/19/2017 1005   MCH 31.8 11/26/2017 1544   MCHC 33.0 11/26/2017 1544   RDW 14.5 11/26/2017 1544   RDW 15.3 (H) 04/19/2017 1005   LYMPHSABS 1.3 11/26/2017 1544   LYMPHSABS 1.0 04/19/2017 1005    MONOABS 0.4 11/26/2017 1544   MONOABS 0.3 04/19/2017 1005   EOSABS 0.1 11/26/2017 1544   EOSABS 0.1 04/19/2017 1005   BASOSABS 0.0 11/26/2017 1544   BASOSABS 0.0 04/19/2017 1005   Iron/TIBC/Ferritin/ %Sat No results found for: IRON, TIBC, FERRITIN, IRONPCTSAT Lipid Panel     Component Value Date/Time   CHOL 210 (H) 11/07/2018 0914   TRIG 61 11/07/2018 0914   HDL 84 11/07/2018 0914   CHOLHDL 2.5 11/07/2018 0914   CHOLHDL 2.6 09/30/2015 1838   VLDL 12 09/30/2015 1838   LDLCALC 114 (H) 11/07/2018 0914   Hepatic Function Panel     Component Value Date/Time   PROT 6.7 06/10/2018 1329   PROT 6.7 04/19/2017 1005   ALBUMIN 4.1 06/10/2018 1329   ALBUMIN 3.9 04/19/2017 1005   AST 17 06/10/2018 1329   AST 17 11/26/2017 1544   AST 14 04/19/2017 1005   ALT 15 06/10/2018 1329   ALT 14 11/26/2017 1544   ALT 11 04/19/2017 1005   ALKPHOS 82 06/10/2018 1329   ALKPHOS 50 04/19/2017 1005   BILITOT 0.5 06/10/2018 1329   BILITOT 0.4 11/26/2017 1544   BILITOT 0.31 04/19/2017 1005      Component Value Date/Time   TSH  0.802 02/18/2018 1029     Ref. Range 06/10/2018 13:29  Vitamin D, 25-Hydroxy Latest Ref Range: 30.0 - 100.0 ng/mL 24.3 (L)    I, Doreene Nest, am acting as Location manager for [employer]. Owens Shark, DO  I have reviewed the above documentation for accuracy and completeness, and I agree with the above. -Jearld Lesch, DO

## 2018-11-20 MED FILL — hydrOXYzine HCL 10 MG TABS: 10 | 30 days supply | Qty: 30 | Fill #1

## 2018-11-20 MED FILL — LINZESS 290 MCG CAPSULE: 290 | 30 days supply | Qty: 30 | Fill #0

## 2018-12-05 DIAGNOSIS — R109 Unspecified abdominal pain: Secondary | ICD-10-CM | POA: Diagnosis not present

## 2018-12-05 DIAGNOSIS — K5900 Constipation, unspecified: Secondary | ICD-10-CM | POA: Diagnosis not present

## 2018-12-05 MED FILL — METHOCARBAMOL 750 MG TABS: 750 | 30 days supply | Qty: 60 | Fill #0

## 2018-12-10 MED FILL — VIT D2 1.25 MG (50,000 UNIT: 1.25 MG | 28 days supply | Qty: 4 | Fill #1

## 2018-12-10 MED FILL — PEG-3350 SOLUTION: 420 | 1 days supply | Qty: 8000 | Fill #0

## 2018-12-11 DIAGNOSIS — M7541 Impingement syndrome of right shoulder: Secondary | ICD-10-CM | POA: Diagnosis not present

## 2018-12-11 DIAGNOSIS — M19012 Primary osteoarthritis, left shoulder: Secondary | ICD-10-CM | POA: Diagnosis not present

## 2018-12-11 DIAGNOSIS — M25511 Pain in right shoulder: Secondary | ICD-10-CM | POA: Diagnosis not present

## 2018-12-11 DIAGNOSIS — M25512 Pain in left shoulder: Secondary | ICD-10-CM | POA: Diagnosis not present

## 2018-12-17 ENCOUNTER — Other Ambulatory Visit: Payer: Self-pay

## 2018-12-17 ENCOUNTER — Ambulatory Visit (INDEPENDENT_AMBULATORY_CARE_PROVIDER_SITE_OTHER): Payer: Medicare Other | Admitting: Bariatrics

## 2018-12-17 ENCOUNTER — Encounter (INDEPENDENT_AMBULATORY_CARE_PROVIDER_SITE_OTHER): Payer: Self-pay | Admitting: Bariatrics

## 2018-12-17 VITALS — BP 126/79 | HR 81 | Temp 97.9°F | Ht 66.0 in | Wt 205.0 lb

## 2018-12-17 DIAGNOSIS — E559 Vitamin D deficiency, unspecified: Secondary | ICD-10-CM | POA: Diagnosis not present

## 2018-12-17 DIAGNOSIS — Z6837 Body mass index (BMI) 37.0-37.9, adult: Secondary | ICD-10-CM

## 2018-12-17 DIAGNOSIS — R7303 Prediabetes: Secondary | ICD-10-CM

## 2018-12-17 NOTE — Progress Notes (Signed)
Office: [phone]  /  Fax: [phone]   HPI:   Chief Complaint: OBESITY [name] is here to discuss her progress with her obesity treatment plan. She is on the Category 2 plan and is following her eating plan approximately 50% of the time. She states she is walking 5,000 steps a day 3 times per week. [name] is up 5 lbs but she has not been here since 07/28/2018. She is still having some GI problems and has a colonoscopy scheduled next week.  Her weight is 205 lb (93 kg) today and has had a weight gain of 5 lbs since her last in-office visit 4 months ago. She has lost 0 lbs since starting treatment with [name].  Pre-Diabetes [name] has a diagnosis of prediabetes based on her elevated Hgb A1c and was informed this puts her at greater risk of developing diabetes. She is on no medications currently and continues to work on diet and exercise to decrease risk of diabetes. She denies nausea or hypoglycemia.  Vitamin D deficiency [name] has a diagnosis of Vitamin D deficiency. Her last Vitamin D level was 24.3 on 06/10/2018. She denies nausea, vomiting or muscle weakness.  ASSESSMENT AND PLAN:  Prediabetes - Plan: Hemoglobin A1c, Comprehensive metabolic panel  Vitamin D deficiency - Plan: VITAMIN D 25 Hydroxy (Vit-D Deficiency, Fractures)  Class 2 severe obesity with serious comorbidity and body mass index (BMI) of 37.0 to 37.9 in adult, unspecified obesity type [facility])  PLAN:  Pre-Diabetes [name] will continue to work on weight loss, exercise, and decreasing simple carbohydrates in her diet to help decrease the risk of diabetes. We dicussed metformin including benefits and risks. She was informed that eating too many simple carbohydrates or too many calories at one sitting increases the likelihood of GI side effects. [name] will decrease carbohydrates, increase protein, and increase activity. She will follow-up with [name] as directed to monitor her progress.  Vitamin D Deficiency  [name] was informed that low Vitamin D levels contributes to fatigue and are associated with obesity, breast, and colon cancer. She will have routine testing of Vitamin D. [name] agrees to follow-up with our clinic in 4 weeks.  Obesity [name] is currently in the action stage of change. As such, her goal is to continue with weight loss efforts. She has agreed to follow the Category 2 plan. [name] will work on meal planning, intentional eating, and increasing protein intake. [name] has been instructed to work up to a goal of 150 minutes of combined cardio and strengthening exercise per week for weight loss and overall health benefits. We discussed the following Behavioral Modification Strategies today: increasing lean protein intake, decreasing simple carbohydrates, increasing vegetables, increase H20 intake, decrease eating out, no skipping meals, work on meal planning and easy cooking plans, and keeping healthy foods in the home.   [name] has agreed to follow-up with our clinic in 4 weeks. She was informed of the importance of frequent follow-up visits to maximize her success with intensive lifestyle modifications for her multiple health conditions.  ALLERGIES: Allergies  Allergen Reactions  . Aspirin Other (See Comments)    ESOPHAGITIS  . Oxycodone Hcl Diarrhea and Nausea And Vomiting  . Propoxyphene N-Acetaminophen Diarrhea and Nausea And Vomiting  . Tramadol Other (See Comments)    Cause migraines  . Adhesive [Tape] Rash and Other (See Comments)    Pulls skin off - please use paper tape  . Prednisone Rash    MEDICATIONS: Current Outpatient Medications on File Prior to Visit  Medication  Sig Dispense Refill  . albuterol (PROAIR HFA) 108 (90 Base) MCG/ACT inhaler Inhale 2 puffs into the lungs every 6 (six) hours as needed for wheezing or shortness of breath.    Marland Kitchen aspirin EC 81 MG tablet Take 81 mg by mouth daily.    . Calcium Citrate-Vitamin D (CALCIUM CITRATE +D PO) Take  2 tablets by mouth daily. Calcium 600 mg     . DULoxetine (CYMBALTA) 60 MG capsule Take 60 mg by mouth daily.    Marland Kitchen HYDROcodone-acetaminophen (NORCO) 5-325 MG tablet Take 1 tablet by mouth every 6 (six) hours as needed for moderate pain. 30 tablet 0  . hydrOXYzine (ATARAX/VISTARIL) 25 MG tablet Take 25 mg by mouth 3 (three) times daily as needed.    Marland Kitchen letrozole (FEMARA) 2.5 MG tablet Take 1 tablet (2.5 mg total) by mouth daily. 90 tablet 3  . linaclotide (LINZESS) 145 MCG CAPS capsule Take 145 mcg by mouth daily before breakfast.    . lisinopril (PRINIVIL,ZESTRIL) 20 MG tablet Take 20 mg by mouth daily.    . mometasone (ELOCON) 0.1 % cream Apply 1 application topically daily as needed (rash - summer eczema). Apply to arms    . Multiple Vitamin (MULTIVITAMIN WITH MINERALS) TABS tablet Take 1 tablet by mouth at bedtime.    . silver sulfADIAZINE (SILVADENE) 1 % cream Apply 1 application topically 2 (two) times daily as needed (stomach tears). 50 g 0  . solifenacin (VESICARE) 10 MG tablet     . valACYclovir (VALTREX) 1000 MG tablet Take 1 tablet (1,000 mg total) by mouth 3 (three) times daily. 21 tablet 0  . Vitamin D, Ergocalciferol, (DRISDOL) 1.25 MG (50000 UT) CAPS capsule TAKE 1 CAPSULE BY MOUTH BY MOUTH EVERY 7 DAYS 4 capsule 0   No current facility-administered medications on file prior to visit.     PAST MEDICAL HISTORY: Past Medical History:  Diagnosis Date  . Anemia    since bypass  . Arthritis    osteoarthritis  . Asthma    states no asthma attack since 2002  . Back pain   . Breast cancer ([place]) 06/26/16 bx   left breast  . Breast cancer ([place])   . Chronic back pain   . Complication of anesthesia    states takes more than normal to put her to sleep  . Constipation   . Dental bridge present    upper  . Dental crowns present   . DVT of upper extremity (deep vein thrombosis) ([place])   . Fibromyalgia   . Genetic testing 09/19/2016   Ms. Baisch underwent genetic counseling and testing  for hereditary cancer syndromes on 08/21/2016. Her results were negative for mutations in all 46 genes analyzed by [facility]'s 46-gene Common Hereditary Cancers Panel. Genes analyzed include: APC, ATM, AXIN2, BARD1, BMPR1A, BRCA1, BRCA2, BRIP1, CDH1, CDKN2A, CHEK2, CTNNA1, DICER1, EPCAM, GREM1, HOXB13, KIT, MEN1, MLH1, MSH2, MSH3, MSH6, MUTYH, NBN,   . Headache(784.0)    migraines  . History of blood transfusion 06/2005  . History of gallstones   . History of pneumonia   . History of shingles 07/2011  . History of shingles   . HTN (hypertension)   . Itching   . Joint pain   . Muscle weakness   . Neuropathy   . Normal coronary arteries 2003  . Osteoarthropathy   . Personal history of chemotherapy 2018  . Personal history of radiation therapy 2018  . PFO (patent foramen ovale)   . Presence of inferior vena cava  filter   . Pulmonary embolism ([place])   . Sleep apnea    used CPAP until after bypass surg.  . Status post gastric bypass for obesity   . Stomach pain   . Stroke [facility]) 12/2002   right-sided weakness  . Urinary incontinence     PAST SURGICAL HISTORY: Past Surgical History:  Procedure Laterality Date  . ABDOMINAL HYSTERECTOMY  11/1997   complete  . ANTERIOR CERVICAL DECOMP/DISCECTOMY FUSION  02/05/2005   C5-6  . APPENDECTOMY  10/22/2008   laparoscopic  . BREAST LUMPECTOMY Left 07/2016  . BREAST LUMPECTOMY WITH RADIOACTIVE SEED AND SENTINEL LYMPH NODE BIOPSY Left 07/26/2016   Procedure: BREAST LUMPECTOMY WITH RADIOACTIVE SEED AND SENTINEL LYMPH NODE BIOPSY;  Surgeon: Erroll Luna, MD;  Location: [place];  Service: General;  Laterality: Left;  . BUNIONECTOMY  05/1980   both feet  . BUNIONECTOMY  08/2011   left foot  . CARDIAC CATHETERIZATION  03/04/2002  . CARPAL TUNNEL RELEASE  06/21/2009   right  . CARPAL TUNNEL RELEASE     left hand  . CARPAL TUNNEL RELEASE  10/03/2011   Procedure: CARPAL TUNNEL RELEASE;  Surgeon: Wynonia Sours, MD;  Location: [place];  Service: Orthopedics;  Laterality: Right;  CARPAL TUNNEL WITH HYPOTHENAR FAT PAD TRANSFER  . CERVICAL SPINE SURGERY  01/2005   titanium plate implanted  . CHOLECYSTECTOMY  1990  . ELBOW SURGERY  08/09/2004   decompression ulnar nerve right elbow  . ENTEROLYSIS  10/22/2008   laparoscopic abd. enterolysis  . GASTRIC ROUX-EN-Y  2009  . HEEL SPUR SURGERY  08/1997   left  . HEMORRHOID SURGERY  03/1993  . LAPAROSCOPIC LYSIS INTESTINAL ADHESIONS  02/14/2000  . NAILBED REPAIR  01/10/2005; 08/2011   exc. matrix bilat. great toe  . OTHER SURGICAL HISTORY  12/1986   pt states that she had surgery to unclog her fallopean tubes  . PORTACATH PLACEMENT N/A 09/24/2016   Procedure: INSERTION PORT-A-CATH WITH [name];  Surgeon: Erroll Luna, MD;  Location: [place];  Service: General;  Laterality: N/A;  . SHOULDER SURGERY     bilat. - (left:  06/2005)  . TONSILLECTOMY  07/1995  . TRIGGER FINGER RELEASE  04/25/2006   decompression A-1 pulley left thumb  . TRIGGER FINGER RELEASE Right 11/11/2018   Procedure: RELEASE TRIGGER FINGER/A-1 PULLEY RIGHT THUMB, RIGHT RING FINGER;  Surgeon: Daryll Brod, MD;  Location: [place];  Service: Orthopedics;  Laterality: Right;  . UTERINE FIBROID SURGERY  12/95, 7/96   x2  . VENA CAVA FILTER PLACEMENT  2009   during Roux-en-Y surg.    SOCIAL HISTORY: Social History   Tobacco Use  . Smoking status: Former Research scientist (life sciences)  . Smokeless tobacco: Never Used  . Tobacco comment: quit smoking 08/1989  Substance Use Topics  . Alcohol use: No  . Drug use: No    FAMILY HISTORY: Family History  Problem Relation Age of Onset  . Breast cancer Paternal Aunt 86  . Cervical cancer Paternal Grandmother 62       d.40s  . Ovarian cancer Maternal Grandmother 23       d.23  . Colon polyps Father   . Diabetes Father        borderline  . Prostate cancer Father   . High blood pressure Father   . Hypertension Mother   . Hypertension Other   . Breast cancer Sister  70       treated with neoadjuvant chemo/radiation and  lumpectomy   ROS: Review of Systems  Gastrointestinal: Negative for nausea and vomiting.  Musculoskeletal:       Negative for muscle weakness.   PHYSICAL EXAM: Blood pressure 126/79, pulse 81, temperature 97.9 F (36.6 C), temperature source Oral, height 5' 6"  (1.676 m), weight 205 lb (93 kg), SpO2 99 %. Body mass index is 33.09 kg/m. Physical Exam Vitals signs reviewed.  Constitutional:      Appearance: Normal appearance. She is obese.  Cardiovascular:     Rate and Rhythm: Normal rate.     Pulses: Normal pulses.  Pulmonary:     Effort: Pulmonary effort is normal.     Breath sounds: Normal breath sounds.  Musculoskeletal: Normal range of motion.  Skin:    General: Skin is warm and dry.  Neurological:     Mental Status: She is alert and oriented to person, place, and time.  Psychiatric:        Behavior: Behavior normal.   RECENT LABS AND TESTS: BMET    Component Value Date/Time   NA 138 06/10/2018 1329   NA 138 04/19/2017 1005   K 4.9 06/10/2018 1329   K 4.0 04/19/2017 1005   CL 100 06/10/2018 1329   CO2 22 06/10/2018 1329   CO2 25 04/19/2017 1005   GLUCOSE 87 06/10/2018 1329   GLUCOSE 80 11/26/2017 1544   GLUCOSE 87 04/19/2017 1005   BUN 11 06/10/2018 1329   BUN 12.0 04/19/2017 1005   CREATININE 0.80 06/10/2018 1329   CREATININE 0.77 11/26/2017 1544   CREATININE 0.7 04/19/2017 1005   CALCIUM 9.3 06/10/2018 1329   CALCIUM 8.9 04/19/2017 1005   GFRNONAA 83 06/10/2018 1329   GFRNONAA >60 11/26/2017 1544   GFRAA 95 06/10/2018 1329   GFRAA >60 11/26/2017 1544   Lab Results  Component Value Date   HGBA1C 5.7 (H) 06/10/2018   HGBA1C 5.8 (H) 02/18/2018   Lab Results  Component Value Date   INSULIN 4.9 02/18/2018   CBC    Component Value Date/Time   WBC 4.3 11/26/2017 1544   WBC 3.5 (L) 04/19/2017 1005   WBC 7.5 12/14/2016 0120   RBC 3.62 (L) 11/26/2017 1544   HGB 11.5 (L) 11/26/2017 1544   HGB  10.2 (L) 04/19/2017 1005   HCT 34.8 11/26/2017 1544   HCT 31.1 (L) 04/19/2017 1005   PLT 192 11/26/2017 1544   PLT 184 04/19/2017 1005   MCV 96.1 11/26/2017 1544   MCV 97.5 04/19/2017 1005   MCH 31.8 11/26/2017 1544   MCHC 33.0 11/26/2017 1544   RDW 14.5 11/26/2017 1544   RDW 15.3 (H) 04/19/2017 1005   LYMPHSABS 1.3 11/26/2017 1544   LYMPHSABS 1.0 04/19/2017 1005   MONOABS 0.4 11/26/2017 1544   MONOABS 0.3 04/19/2017 1005   EOSABS 0.1 11/26/2017 1544   EOSABS 0.1 04/19/2017 1005   BASOSABS 0.0 11/26/2017 1544   BASOSABS 0.0 04/19/2017 1005   Iron/TIBC/Ferritin/ %Sat No results found for: IRON, TIBC, FERRITIN, IRONPCTSAT Lipid Panel     Component Value Date/Time   CHOL 210 (H) 11/07/2018 0914   TRIG 61 11/07/2018 0914   HDL 84 11/07/2018 0914   CHOLHDL 2.5 11/07/2018 0914   CHOLHDL 2.6 09/30/2015 1838   VLDL 12 09/30/2015 1838   LDLCALC 114 (H) 11/07/2018 0914   Hepatic Function Panel     Component Value Date/Time   PROT 6.7 06/10/2018 1329   PROT 6.7 04/19/2017 1005   ALBUMIN 4.1 06/10/2018 1329   ALBUMIN 3.9 04/19/2017 1005  AST 17 06/10/2018 1329   AST 17 11/26/2017 1544   AST 14 04/19/2017 1005   ALT 15 06/10/2018 1329   ALT 14 11/26/2017 1544   ALT 11 04/19/2017 1005   ALKPHOS 82 06/10/2018 1329   ALKPHOS 50 04/19/2017 1005   BILITOT 0.5 06/10/2018 1329   BILITOT 0.4 11/26/2017 1544   BILITOT 0.31 04/19/2017 1005      Component Value Date/Time   TSH 0.802 02/18/2018 1029   Results for [name], HAMOR (MRN [id number]) as of 12/17/2018 15:02  Ref. Range 06/10/2018 13:29  Vitamin D, 25-Hydroxy Latest Ref Range: 30.0 - 100.0 ng/mL 24.3 (L)   OBESITY BEHAVIORAL INTERVENTION VISIT  Today's visit was #11  Starting weight: 204 lbs Starting date: 02/18/2018 Today's weight: 205 lbs Today's date: 12/17/2018 Total lbs lost to date: 0 At least 15 minutes were spent on discussing the following behavioral intervention visit.    12/17/2018  Height 5' 6"   (1.676 m)  Weight 205 lb (93 kg)  BMI (Calculated) 33.1  BLOOD PRESSURE - SYSTOLIC 991  BLOOD PRESSURE - DIASTOLIC 79   Body Fat % 44.4 %   ASK: We discussed the diagnosis of obesity with [name] today and Aliya agreed to give [name] permission to discuss obesity behavioral modification therapy today.  ASSESS: Harriette has the diagnosis of obesity and her BMI today is 37.6. Jasnoor is in the action stage of change.   ADVISE: Gracey was educated on the multiple health risks of obesity as well as the benefit of weight loss to improve her health. She was advised of the need for long term treatment and the importance of lifestyle modifications to improve her current health and to decrease her risk of future health problems.  AGREE: Multiple dietary modification options and treatment options were discussed and  Karrina agreed to follow the recommendations documented in the above note.  ARRANGE: Lilymarie was educated on the importance of frequent visits to treat obesity as outlined per CMS and USPSTF guidelines and agreed to schedule her next follow up appointment today.  Migdalia Dk, am acting as Location manager for [employer], DO  I have reviewed the above documentation for accuracy and completeness, and I agree with the above. -Jearld Lesch, DO

## 2018-12-18 LAB — COMPREHENSIVE METABOLIC PANEL
ALT: 14 IU/L (ref 0–32)
AST: 16 IU/L (ref 0–40)
Albumin/Globulin Ratio: 1.8 (ref 1.2–2.2)
Albumin: 4.8 g/dL (ref 3.8–4.9)
Alkaline Phosphatase: 102 IU/L (ref 39–117)
BUN/Creatinine Ratio: 21 (ref 9–23)
BUN: 14 mg/dL (ref 6–24)
Bilirubin Total: 0.5 mg/dL (ref 0.0–1.2)
CO2: 20 mmol/L (ref 20–29)
Calcium: 9.1 mg/dL (ref 8.7–10.2)
Chloride: 102 mmol/L (ref 96–106)
Creatinine, Ser: 0.68 mg/dL (ref 0.57–1.00)
GFR calc Af Amer: 113 mL/min/{1.73_m2} (ref >59)
GFR calc non Af Amer: 98 mL/min/{1.73_m2} (ref >59)
Globulin, Total: 2.7 g/dL (ref 1.5–4.5)
Glucose: 65 mg/dL (ref 65–99)
Potassium: 4 mmol/L (ref 3.5–5.2)
Sodium: 140 mmol/L (ref 134–144)
Total Protein: 7.5 g/dL (ref 6.0–8.5)

## 2018-12-18 LAB — HEMOGLOBIN A1C
Est. average glucose Bld gHb Est-mCnc: 114 mg/dL
Hgb A1c MFr Bld: 5.6 % (ref 4.8–5.6)

## 2018-12-18 LAB — VITAMIN D 25 HYDROXY (VIT D DEFICIENCY, FRACTURES): Vit D, 25-Hydroxy: 17.3 ng/mL — ABNORMAL LOW (ref 30.0–100.0)

## 2018-12-19 MED FILL — LINZESS 290 MCG CAPSULE: 290 | 30 days supply | Qty: 30 | Fill #1

## 2018-12-22 ENCOUNTER — Telehealth: Payer: Self-pay | Admitting: *Deleted

## 2018-12-22 NOTE — Telephone Encounter (Signed)
Left a message for the patient to call back concerning her MyChart message.   A reply has also been sent back to her message in [place].

## 2018-12-23 DIAGNOSIS — R194 Change in bowel habit: Secondary | ICD-10-CM | POA: Diagnosis not present

## 2018-12-23 DIAGNOSIS — R1084 Generalized abdominal pain: Secondary | ICD-10-CM | POA: Diagnosis not present

## 2018-12-23 DIAGNOSIS — K5900 Constipation, unspecified: Secondary | ICD-10-CM | POA: Diagnosis not present

## 2018-12-24 ENCOUNTER — Other Ambulatory Visit: Payer: Self-pay | Admitting: Physician Assistant

## 2018-12-24 ENCOUNTER — Telehealth: Payer: Medicare Other | Admitting: Cardiovascular Disease

## 2018-12-24 DIAGNOSIS — G8929 Other chronic pain: Secondary | ICD-10-CM

## 2018-12-25 DIAGNOSIS — M170 Bilateral primary osteoarthritis of knee: Secondary | ICD-10-CM | POA: Diagnosis not present

## 2018-12-25 DIAGNOSIS — M25562 Pain in left knee: Secondary | ICD-10-CM | POA: Diagnosis not present

## 2018-12-25 DIAGNOSIS — M25561 Pain in right knee: Secondary | ICD-10-CM | POA: Diagnosis not present

## 2018-12-26 ENCOUNTER — Ambulatory Visit (INDEPENDENT_AMBULATORY_CARE_PROVIDER_SITE_OTHER): Payer: Medicare Other | Admitting: Cardiovascular Disease

## 2018-12-26 ENCOUNTER — Other Ambulatory Visit: Payer: Self-pay

## 2018-12-26 VITALS — BP 141/81 | HR 68 | Temp 98.1°F | Ht 61.0 in | Wt 211.0 lb

## 2018-12-26 DIAGNOSIS — I749 Embolism and thrombosis of unspecified artery: Secondary | ICD-10-CM

## 2018-12-26 DIAGNOSIS — Z95828 Presence of other vascular implants and grafts: Secondary | ICD-10-CM

## 2018-12-26 DIAGNOSIS — R079 Chest pain, unspecified: Secondary | ICD-10-CM

## 2018-12-26 DIAGNOSIS — I700 Atherosclerosis of aorta: Secondary | ICD-10-CM

## 2018-12-26 DIAGNOSIS — R0789 Other chest pain: Secondary | ICD-10-CM

## 2018-12-26 DIAGNOSIS — Z923 Personal history of irradiation: Secondary | ICD-10-CM

## 2018-12-26 DIAGNOSIS — Z853 Personal history of malignant neoplasm of breast: Secondary | ICD-10-CM

## 2018-12-26 DIAGNOSIS — E7800 Pure hypercholesterolemia, unspecified: Secondary | ICD-10-CM

## 2018-12-26 DIAGNOSIS — Z86711 Personal history of pulmonary embolism: Secondary | ICD-10-CM

## 2018-12-26 DIAGNOSIS — I69351 Hemiplegia and hemiparesis following cerebral infarction affecting right dominant side: Secondary | ICD-10-CM

## 2018-12-26 DIAGNOSIS — R030 Elevated blood-pressure reading, without diagnosis of hypertension: Secondary | ICD-10-CM

## 2018-12-26 NOTE — Patient Instructions (Signed)

## 2018-12-26 NOTE — Progress Notes (Signed)
Cardiology Office Note:    Date:  12/27/2018   ID:  [name], DOB 07-Oct-1960, MRN [id number]  PCP:  [name], MD  Cardiologist:  Sanda Klein, MD  Electrophysiologist:  None   Referring MD: [name], MD   Chief Complaint  Patient presents with  . Follow-up  History of pulmonary embolism, paradoxical embolism stroke, inferior vena cava filter  History of Present Illness:    [name] is a 58 y.o. 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  She has normal left ventricular systolic function with "moderate diastolic dysfunction" by echo in May 2018 (on my review I would have said that she has normal diastolic function), normal coronary arteries by cardiac catheterization in 2003, normal nuclear stress test in 2017, normal ABIs in 2012.  Abdominal CT in 2012 did show moderate atheromatous calcification in the infrarenal aorta, without any significant arterial obstruction in the lower extremities and three-vessel runoff bilaterally.  I am not entirely sure, but it seems that the inferior vena cava filter was implanted prophylactically before her gastric bypass surgery since she had a history of pulmonary embolism, paradoxical stroke and upper extremity DVT.  She was on anticoagulation with warfarin until 2009, only on aspirin since then without any recurrent arterial or venous thromboembolic events.  She generally feels well, she is still breathing the fact that her mother nearly passed a few months ago and still feels that she is recovering from the psychological trauma of her own breast cancer diagnosis last year.  She has  been participating in the [employer] and is trying hard to lose weight.  She is here to reestablish cardiology follow-up.  She has chronic complaints of atypical chest discomfort that has not changed in years.  She asked about the implication of radiation therapy on the risk for coronary disease.  She denies dyspnea at rest or with activity and does not have lower extremity edema.  She denies claudication.  She walks with a cane for 30 minutes a day most days of the week.  She has a variety of gastrointestinal complaints and recently had hand surgery.  She does not have diabetes mellitus and has only mild hypercholesterolemia (LDL 114, total cholesterol 210) but an outstanding HDL cholesterol of 84.  She has never smoked.  Past Medical History:  Diagnosis Date  . Anemia    since bypass  . Arthritis    osteoarthritis  . Asthma    states no asthma attack since 2002  . Back pain   . Breast cancer ([place]) 06/26/16 bx   left breast  . Breast cancer ([place])   . Chronic back pain   . Complication of anesthesia    states takes more than normal to put her to sleep  . Constipation   . Dental bridge present    upper  . Dental crowns present   . DVT of upper extremity (deep vein thrombosis) ([place])   . Fibromyalgia   . Genetic testing 09/19/2016   Ms. Port underwent genetic counseling and testing for hereditary cancer syndromes on 08/21/2016. Her results were negative for mutations in all 46 genes analyzed by [facility]'s 46-gene Common Hereditary Cancers Panel. Genes analyzed  include: APC, ATM, AXIN2, BARD1, BMPR1A, BRCA1, BRCA2, BRIP1, CDH1, CDKN2A, CHEK2, CTNNA1, DICER1, EPCAM, GREM1, HOXB13, KIT, MEN1, MLH1, MSH2, MSH3, MSH6, MUTYH, NBN,   . Headache(784.0)    migraines  . History of blood transfusion 06/2005  . History of gallstones   . History of pneumonia   . History of shingles 07/2011  . History of shingles   . HTN (hypertension)   . Itching   . Joint pain   . Muscle weakness    . Neuropathy   . Normal coronary arteries 2003  . Osteoarthropathy   . Personal history of chemotherapy 2018  . Personal history of radiation therapy 2018  . PFO (patent foramen ovale)   . Presence of inferior vena cava filter   . Pulmonary embolism ([place])   . Sleep apnea    used CPAP until after bypass surg.  . Status post gastric bypass for obesity   . Stomach pain   . Stroke [facility]) 12/2002   right-sided weakness  . Urinary incontinence     Past Surgical History:  Procedure Laterality Date  . ABDOMINAL HYSTERECTOMY  11/1997   complete  . ANTERIOR CERVICAL DECOMP/DISCECTOMY FUSION  02/05/2005   C5-6  . APPENDECTOMY  10/22/2008   laparoscopic  . BREAST LUMPECTOMY Left 07/2016  . BREAST LUMPECTOMY WITH RADIOACTIVE SEED AND SENTINEL LYMPH NODE BIOPSY Left 07/26/2016   Procedure: BREAST LUMPECTOMY WITH RADIOACTIVE SEED AND SENTINEL LYMPH NODE BIOPSY;  Surgeon: Erroll Luna, MD;  Location: [place];  Service: General;  Laterality: Left;  . BUNIONECTOMY  05/1980   both feet  . BUNIONECTOMY  08/2011   left foot  . CARDIAC CATHETERIZATION  03/04/2002  . CARPAL TUNNEL RELEASE  06/21/2009   right  . CARPAL TUNNEL RELEASE     left hand  . CARPAL TUNNEL RELEASE  10/03/2011   Procedure: CARPAL TUNNEL RELEASE;  Surgeon: Wynonia Sours, MD;  Location: [place];  Service: Orthopedics;  Laterality: Right;  CARPAL TUNNEL WITH HYPOTHENAR FAT PAD TRANSFER  . CERVICAL SPINE SURGERY  01/2005   titanium plate implanted  . CHOLECYSTECTOMY  1990  . ELBOW SURGERY  08/09/2004   decompression ulnar nerve right elbow  . ENTEROLYSIS  10/22/2008   laparoscopic abd. enterolysis  . GASTRIC ROUX-EN-Y  2009  . HEEL SPUR SURGERY  08/1997   left  . HEMORRHOID SURGERY  03/1993  . LAPAROSCOPIC LYSIS INTESTINAL ADHESIONS  02/14/2000  . NAILBED REPAIR  01/10/2005; 08/2011   exc. matrix bilat. great toe  . OTHER SURGICAL HISTORY  12/1986   pt states that she had surgery to unclog her  fallopean tubes  . PORTACATH PLACEMENT N/A 09/24/2016   Procedure: INSERTION PORT-A-CATH WITH [name];  Surgeon: Erroll Luna, MD;  Location: [place];  Service: General;  Laterality: N/A;  . SHOULDER SURGERY     bilat. - (left:  06/2005)  . TONSILLECTOMY  07/1995  . TRIGGER FINGER RELEASE  04/25/2006   decompression A-1 pulley left thumb  . TRIGGER FINGER RELEASE Right 11/11/2018   Procedure: RELEASE TRIGGER FINGER/A-1 PULLEY RIGHT THUMB, RIGHT RING FINGER;  Surgeon: Daryll Brod, MD;  Location: [place];  Service: Orthopedics;  Laterality: Right;  . UTERINE FIBROID SURGERY  12/95, 7/96   x2  . VENA CAVA FILTER PLACEMENT  2009   during Roux-en-Y surg.    Current Medications: Current Meds  Medication Sig  . albuterol (PROAIR HFA) 108 (90 Base) MCG/ACT inhaler Inhale 2 puffs into the lungs every  6 (six) hours as needed for wheezing or shortness of breath.  Marland Kitchen aspirin EC 81 MG tablet Take 81 mg by mouth daily.  . Calcium Citrate-Vitamin D (CALCIUM CITRATE +D PO) Take 2 tablets by mouth daily. Calcium 600 mg   . hydrOXYzine (ATARAX/VISTARIL) 25 MG tablet Take 25 mg by mouth 3 (three) times daily as needed.  Marland Kitchen letrozole (FEMARA) 2.5 MG tablet Take 1 tablet (2.5 mg total) by mouth daily.  Marland Kitchen linaclotide (LINZESS) 290 MCG CAPS capsule Take 290 mcg by mouth daily before breakfast.  . Methocarbamol (ROBAXIN-750 PO) Robaxin  . mometasone (ELOCON) 0.1 % cream Apply 1 application topically daily as needed (rash - summer eczema). Apply to arms  . Multiple Vitamin (MULTIVITAMIN WITH MINERALS) TABS tablet Take 1 tablet by mouth at bedtime.  . silver sulfADIAZINE (SILVADENE) 1 % cream Apply 1 application topically 2 (two) times daily as needed (stomach tears).  . solifenacin (VESICARE) 10 MG tablet   . valACYclovir (VALTREX) 1000 MG tablet Take 1 tablet (1,000 mg total) by mouth 3 (three) times daily.  . Vitamin D, Ergocalciferol, (DRISDOL) 1.25 MG (50000 UT) CAPS capsule TAKE 1 CAPSULE BY MOUTH  BY MOUTH EVERY 7 DAYS     Allergies:   Aspirin, Oxycodone hcl, Propoxyphene n-acetaminophen, Tramadol, Adhesive [tape], and Prednisone   Social History   Socioeconomic History  . Marital status: Single    Spouse name: Not on file  . Number of children: 0  . Years of education: Not on file  . Highest education level: Not on file  Occupational History  . Occupation: disabled  Social Needs  . Financial resource strain: Not on file  . Food insecurity    Worry: Not on file    Inability: Not on file  . Transportation needs    Medical: Not on file    Non-medical: Not on file  Tobacco Use  . Smoking status: Former Research scientist (life sciences)  . Smokeless tobacco: Never Used  . Tobacco comment: quit smoking 08/1989  Substance and Sexual Activity  . Alcohol use: No  . Drug use: No  . Sexual activity: Not Currently    Birth control/protection: Surgical  Lifestyle  . Physical activity    Days per week: Not on file    Minutes per session: Not on file  . Stress: Not on file  Relationships  . Social Herbalist on phone: Not on file    Gets together: Not on file    Attends religious service: Not on file    Active member of club or organization: Not on file    Attends meetings of clubs or organizations: Not on file    Relationship status: Not on file  Other Topics Concern  . Not on file  Social History Narrative  . Not on file     Family History: The patient's family history includes Breast cancer (age of onset: 72) in her paternal aunt; Breast cancer (age of onset: 22) in her sister; Cervical cancer (age of onset: 37) in her paternal grandmother; Colon polyps in her father; Diabetes in her father; High blood pressure in her father; Hypertension in her mother and another family member; Ovarian cancer (age of onset: 48) in her maternal grandmother; Prostate cancer in her father.  ROS:   Please see the history of present illness.     All other systems reviewed and are negative.   EKGs/Labs/Other Studies Reviewed:    The following studies were reviewed today: Reviewed echo from 2017, nuclear  stress test 2017, lower extremity arterial Dopplers 2012, multiple CT studies including CT of the abdomen, CT of the chest, CTs of the brain and MRI of the brain in 2004  EKG:  EKG is not ordered today.  The ekg was 11/07/2018 today demonstrates sinus rhythm with a single PVC otherwise normal tracing  Recent Labs: 02/18/2018: TSH 0.802 12/17/2018: ALT 14; BUN 14; Creatinine, Ser 0.68; Potassium 4.0; Sodium 140  Recent Lipid Panel    Component Value Date/Time   CHOL 210 (H) 11/07/2018 0914   TRIG 61 11/07/2018 0914   HDL 84 11/07/2018 0914   CHOLHDL 2.5 11/07/2018 0914   CHOLHDL 2.6 09/30/2015 1838   VLDL 12 09/30/2015 1838   LDLCALC 114 (H) 11/07/2018 0914    Physical Exam:    VS:  BP (!) 141/81   Pulse 68   Temp 98.1 F (36.7 C)   Ht 5' 1"  (1.549 m)   Wt 211 lb (95.7 kg)   SpO2 99%   BMI 39.87 kg/m     Wt Readings from Last 3 Encounters:  12/26/18 211 lb (95.7 kg)  12/17/18 205 lb (93 kg)  11/11/18 208 lb 5.4 oz (94.5 kg)     GEN: Severely obese, well nourished, well developed in no acute distress HEENT: Normal NECK: No JVD; No carotid bruits LYMPHATICS: No lymphadenopathy CARDIAC: RRR, no murmurs, rubs, gallops RESPIRATORY:  Clear to auscultation without rales, wheezing or rhonchi  ABDOMEN: Soft, non-tender, non-distended MUSCULOSKELETAL:  No edema; No deformity  SKIN: Warm and dry NEUROLOGIC:  Alert and oriented x 3, right lower extremity weakness, walking with a cane PSYCHIATRIC:  Normal affect   ASSESSMENT:    1. Chest pain of uncertain etiology   2. Hemiparesis affecting right side as late effect of stroke ([place])   3. Paradoxical embolism ([place])   4. History of pulmonary embolism   5. Presence of inferior vena cava filter   6. Atherosclerosis of aorta ([place])   7. Hypercholesterolemia   8. Morbid obesity ([place])   9. Elevated blood pressure  reading   10. History of left breast cancer   11. History of external beam radiation therapy    PLAN:    In order of problems listed above:  1. Chest pain: Chronic complete without any evidence of ischemic heart disease on previous cardiac cath, relatively recent nuclear stress test or ECG.  Suspect noncardiac etiology, although it is hard to pinpoint the exact reason. 2. History of stroke/suspected paradoxical embolism due to PFO: No recent events in the last 16 years.  She is currently only on aspirin.  She has a history of atrial flutter, but excellent the texture from small paradoxical emboli can cause stroke.  At this point it is difficult to argue for PFO closure such a long period of time without events.  Any potential embolic event, even a TIA should prompt reconsideration of PFO closure with a device.  Note that we would have to traverse her inferior vena cava filter from a femoral approach, but the device could be deployed via subclavian approach as well. 3. 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  No recent events.  On aspirin only.  As above, any recurrent venous thromboembolic event would probably prompt an indication for lifelong anticoagulation. 4.  IVC filter: At this point this is likely completely endothelialized and will be difficult to remove; hopefully also less likely to be a nidus for thrombus formation. 5. Ao atherosclerosis: She has moderate nonobstructive atheromatous disease  in the abdominal aorta, but no evidence of meaningful disease in the coronaries, carotids, thoracic aorta or lower extremity arterial system on multiple past imaging studies. 6.  HLP: She has a mildly elevated LDL cholesterol but this is balanced by an exceptionally high level of HDL.  In the absence of significant vascular disease would not recommend statin therapy.  She has predominantly buttock and thigh obesity, without that much in the way of abdominal fat, suggesting a  less aggressive genetic/metabolic risk for vascular disease.  This is supported by the fact that she has excellent glucose levels despite morbid obesity. 7.  Obesity: She has borderline morbid obesity, despite previous gastric bypass surgery.  She is trying hard to lose weight and seems to have a renewed commitment in the wellness program.  Discussed the fact that healthy diet and exercise will be beneficial even if she does not lose a lot of weight.  Reviewed the changes that are most likely to be fruitful.  Cut back on sweets and starchy foods.  Increase intake of lean protein and non-saturated fat.  Ideally, exercise for a total of 3 hours a week, of which 30 minutes should be moderately intense.  Her choices of activity are limited by her neurological sequelae.  We set a conservative goal of 11 pound weight loss by the end of the year. 8. HTN: Questionable diagnosis.  Her blood pressure is slightly elevated today but just a few days ago was 126/79.  I do not think she requires antihypertensive medications, but should continue with her efforts at weight loss. 9. History of left breast cancer status post radiation therapy: While chest radiation can increase the likelihood of coronary disease, this is unlikely to show up for several more years in the future.   Medication Adjustments/Labs and Tests Ordered: Current medicines are reviewed at length with the patient today.  Concerns regarding medicines are outlined above.  No orders of the defined types were placed in this encounter.  No orders of the defined types were placed in this encounter.   Patient Instructions  Medication Instructions:  Your physician recommends that you continue on your current medications as directed. Please refer to the Current Medication list given to you today.  If you need a refill on your cardiac medications before your next appointment, please call your pharmacy.   Lab work: None ordered If you have labs (blood  work) drawn today and your tests are completely normal, you will receive your results only by: [place] (if you have MyChart) OR A paper copy in the mail If you have any lab test that is abnormal or we need to change your treatment, we will call you to review the results.  Testing/Procedures: None ordered  Follow-Up: At [facility], you and your health needs are our priority.  As part of our continuing mission to provide you with exceptional heart care, we have created designated Provider Care Teams.  These Care Teams include your primary Cardiologist (physician) and Advanced Practice Providers (APPs -  Physician Assistants and Nurse Practitioners) who all work together to provide you with the care you need, when you need it. You will need a follow up appointment in 6 months.  Please call our office 2 months in advance to schedule this appointment.  You may see Sanda Klein, MD or one of the following Advanced Practice Providers on your designated Care Team: Almyra Deforest, PA-C Fabian Sharp, PA-C        Signed, Sanda Klein, MD  12/27/2018 5:00 PM    [place]

## 2018-12-27 ENCOUNTER — Encounter: Payer: Self-pay | Admitting: Cardiovascular Disease

## 2019-01-06 ENCOUNTER — Other Ambulatory Visit: Payer: Self-pay | Admitting: Gastroenterology

## 2019-01-12 MED FILL — VIT D2 1.25 MG (50,000 UNIT: 1.25 MG | 28 days supply | Qty: 4 | Fill #2

## 2019-01-12 MED FILL — LISINOPRIL 10 MG TABS: 10 | 90 days supply | Qty: 90 | Fill #1

## 2019-01-12 MED FILL — VALACYCLOVIR HCL 500 MG TAB: 500 | 90 days supply | Qty: 180 | Fill #0

## 2019-01-12 MED FILL — LINZESS 290 MCG CAPSULE: 290 | 30 days supply | Qty: 30 | Fill #2

## 2019-01-12 MED FILL — SOLIFENACIN SUCCINATE 10 MG: 10 | 30 days supply | Qty: 30 | Fill #4

## 2019-01-13 ENCOUNTER — Other Ambulatory Visit: Payer: Self-pay | Admitting: Hematology and Oncology

## 2019-01-13 DIAGNOSIS — C50512 Malignant neoplasm of lower-outer quadrant of left female breast: Secondary | ICD-10-CM

## 2019-01-13 DIAGNOSIS — Z170 Estrogen receptor positive status [ER+]: Secondary | ICD-10-CM

## 2019-01-14 ENCOUNTER — Ambulatory Visit (INDEPENDENT_AMBULATORY_CARE_PROVIDER_SITE_OTHER): Payer: Medicare Other | Admitting: Bariatrics

## 2019-01-14 MED FILL — LETROZOLE 2.5 MG TABS: 2.5 | 90 days supply | Qty: 90 | Fill #0

## 2019-01-14 NOTE — Telephone Encounter (Signed)
Next scheduled F/U: February 2021.

## 2019-01-15 ENCOUNTER — Other Ambulatory Visit: Payer: Self-pay

## 2019-01-15 ENCOUNTER — Ambulatory Visit (INDEPENDENT_AMBULATORY_CARE_PROVIDER_SITE_OTHER): Payer: Medicare Other | Admitting: Bariatrics

## 2019-01-15 ENCOUNTER — Encounter (INDEPENDENT_AMBULATORY_CARE_PROVIDER_SITE_OTHER): Payer: Self-pay | Admitting: Bariatrics

## 2019-01-15 VITALS — BP 149/88 | HR 69 | Temp 99.3°F | Ht 61.0 in | Wt 205.0 lb

## 2019-01-15 DIAGNOSIS — K5909 Other constipation: Secondary | ICD-10-CM

## 2019-01-15 DIAGNOSIS — E8881 Metabolic syndrome: Secondary | ICD-10-CM | POA: Diagnosis not present

## 2019-01-15 DIAGNOSIS — E559 Vitamin D deficiency, unspecified: Secondary | ICD-10-CM | POA: Diagnosis not present

## 2019-01-15 DIAGNOSIS — E669 Obesity, unspecified: Secondary | ICD-10-CM | POA: Diagnosis not present

## 2019-01-15 DIAGNOSIS — Z6833 Body mass index (BMI) 33.0-33.9, adult: Secondary | ICD-10-CM | POA: Diagnosis not present

## 2019-01-15 DIAGNOSIS — Z6837 Body mass index (BMI) 37.0-37.9, adult: Secondary | ICD-10-CM

## 2019-01-15 MED ORDER — VITAMIN D3 1.25 MG (50000 UT) PO CAPS: 1.0000 | ORAL_CAPSULE | ORAL | 0 refills | Status: DC

## 2019-01-15 MED FILL — VIT D3-50 50,000 UNITS CAPS: 1.25 MG | 28 days supply | Qty: 4 | Fill #0

## 2019-01-17 ENCOUNTER — Other Ambulatory Visit (HOSPITAL_COMMUNITY)
Admission: RE | Admit: 2019-01-17 | Discharge: 2019-01-17 | Disposition: A | Discharge status: Home / Self Care | Payer: Medicare Other | Source: Ambulatory Visit | Attending: Gastroenterology | Admitting: Gastroenterology

## 2019-01-17 DIAGNOSIS — Z20828 Contact with and (suspected) exposure to other viral communicable diseases: Secondary | ICD-10-CM | POA: Diagnosis not present

## 2019-01-17 DIAGNOSIS — Z01812 Encounter for preprocedural laboratory examination: Secondary | ICD-10-CM | POA: Diagnosis not present

## 2019-01-17 LAB — SARS CORONAVIRUS 2 (TAT 6-24 HRS): SARS Coronavirus 2: NEGATIVE

## 2019-01-19 ENCOUNTER — Encounter (INDEPENDENT_AMBULATORY_CARE_PROVIDER_SITE_OTHER): Payer: Self-pay | Admitting: Bariatrics

## 2019-01-19 NOTE — Progress Notes (Signed)
Office: [phone]  /  Fax: [phone]   HPI:   Chief Complaint: OBESITY [name] is here to discuss her progress with her obesity treatment plan. She is on the Category 2 plan and is following her eating plan approximately 90 % of the time. She states she is walking and pedaling 90 minutes 1 time per week. [name] has stayed the same. She has a goal of losing below 200 pounds by the end of the year.  Her weight is 205 lb (93 kg) today and she has maintained weight since her last visit. She has gained 1 lb since starting treatment with [name].  Insulin Resistance [name] has a diagnosis of insulin resistance based on her elevated fasting insulin level >5. Although [name]'s blood glucose readings are still under good control, insulin resistance puts her at greater risk of metabolic syndrome and diabetes. Her last A1c was at 5.6 and last insulin level was at 4.9 She is not taking metformin currently and continues to work on diet and exercise to decrease risk of diabetes.  Vitamin D deficiency [name] has a diagnosis of vitamin D deficiency. She is currently taking vit D and her last vitamin D level was at 17.3. Dalal denies nausea, vomiting or muscle weakness.  Constipation [name] notes constipation for the last few weeks, worse since attempting weight loss. She states BM are less frequent and are not hard and painful. She denies hematochezia or melena. She is starting on Linzess and Miralax 3 times a day.  ASSESSMENT AND PLAN:  Insulin resistance  Vitamin D deficiency - Plan: Cholecalciferol (VITAMIN D3) 1.25 MG (50000 UT) CAPS  Other constipation  Class 1 obesity with serious comorbidity and body mass index (BMI) of 33.0 to 33.9 in adult, unspecified obesity type  Class 2 severe obesity due to excess calories with serious comorbidity and body mass index (BMI) of 37.0 to 37.9 in adult [facility])  PLAN:  Insulin Resistance [name] will continue to work on weight loss,  increasing activities, increasing lean protein and decreasing simple carbohydrates in her diet to help decrease the risk of diabetes. She was informed that eating too many simple carbohydrates or too many calories at one sitting increases the likelihood of GI side effects. [name] agreed to follow up with [name] as directed to monitor her progress.  Vitamin D Deficiency [name] was informed that low vitamin D levels contributes to fatigue and are associated with obesity, breast, and colon cancer. She agrees to stop vitamin D2 and start to take prescription Vit D3 _0 ,000 IU every week #4 with no refills and will follow up for routine testing of vitamin D, at least 2-3 times per year. She was informed of the risk of over-replacement of vitamin D and agrees to not increase her dose unless she discusses this with [name] first. [name] agrees to follow up as directed.  Constipation [name] was informed decrease bowel movement frequency is normal while losing weight, but stools should not be hard or painful. She was advised to increase her H20 intake and work on increasing her fiber intake. High fiber foods were discussed today. [name] will continue to see the gastroenterologist.  Obesity [name] is currently in the action stage of change. As such, her goal is to continue with weight loss efforts She has agreed to follow the Category 2 plan +100 calories [name] will continue to walk and pedal and she will increase as necessary for weight loss and overall health benefits.  We discussed the following Behavioral Modification Strategies today:  increase H2O intake, no skipping meals, keeping healthy foods in the home, increasing lean protein intake, decreasing simple carbohydrates , increasing vegetables, decrease eating out and work on meal planning and intentional eating We discussed goals for each meal (breakfast, lunch, dinner) and protein content of foods  [name] has agreed to follow up with our  clinic in 3 weeks. She was informed of the importance of frequent follow up visits to maximize her success with intensive lifestyle modifications for her multiple health conditions.  ALLERGIES: Allergies  Allergen Reactions  . Aspirin Other (See Comments)    ESOPHAGITIS  . Oxycodone Hcl Diarrhea and Nausea And Vomiting  . Propoxyphene N-Acetaminophen Diarrhea and Nausea And Vomiting  . Tramadol Other (See Comments)    Cause migraines  . Adhesive [Tape] Rash and Other (See Comments)    Pulls skin off - please use paper tape  . Prednisone Rash    MEDICATIONS: Current Outpatient Medications on File Prior to Visit  Medication Sig Dispense Refill  . albuterol (PROAIR HFA) 108 (90 Base) MCG/ACT inhaler Inhale 2 puffs into the lungs every 6 (six) hours as needed for wheezing or shortness of breath.    Marland Kitchen aspirin EC 81 MG tablet Take 81 mg by mouth daily.    . Calcium Citrate-Vitamin D (CALCIUM CITRATE +D PO) Take 2 tablets by mouth daily. Calcium 600 mg     . hydrOXYzine (ATARAX/VISTARIL) 25 MG tablet Take 25 mg by mouth 3 (three) times daily as needed.    Marland Kitchen letrozole (FEMARA) 2.5 MG tablet TAKE 1 TABLET (2.5 MG TOTAL) BY MOUTH DAILY. 90 tablet 1  . linaclotide (LINZESS) 290 MCG CAPS capsule Take 290 mcg by mouth daily before breakfast.    . Methocarbamol (ROBAXIN-750 PO) Robaxin    . mometasone (ELOCON) 0.1 % cream Apply 1 application topically daily as needed (rash - summer eczema). Apply to arms    . Multiple Vitamin (MULTIVITAMIN WITH MINERALS) TABS tablet Take 1 tablet by mouth at bedtime.    . silver sulfADIAZINE (SILVADENE) 1 % cream Apply 1 application topically 2 (two) times daily as needed (stomach tears). 50 g 0  . solifenacin (VESICARE) 10 MG tablet     . valACYclovir (VALTREX) 1000 MG tablet Take 1 tablet (1,000 mg total) by mouth 3 (three) times daily. 21 tablet 0   No current facility-administered medications on file prior to visit.     PAST MEDICAL HISTORY: Past Medical  History:  Diagnosis Date  . Anemia    since bypass  . Arthritis    osteoarthritis  . Asthma    states no asthma attack since 2002  . Back pain   . Breast cancer ([place]) 06/26/16 bx   left breast  . Breast cancer ([place])   . Chronic back pain   . Complication of anesthesia    states takes more than normal to put her to sleep  . Constipation   . Dental bridge present    upper  . Dental crowns present   . DVT of upper extremity (deep vein thrombosis) ([place])   . Fibromyalgia   . Genetic testing 09/19/2016   Ms. Mcglothen underwent genetic counseling and testing for hereditary cancer syndromes on 08/21/2016. Her results were negative for mutations in all 46 genes analyzed by [facility]'s 46-gene Common Hereditary Cancers Panel. Genes analyzed include: APC, ATM, AXIN2, BARD1, BMPR1A, BRCA1, BRCA2, BRIP1, CDH1, CDKN2A, CHEK2, CTNNA1, DICER1, EPCAM, GREM1, HOXB13, KIT, MEN1, MLH1, MSH2, MSH3, MSH6, MUTYH, NBN,   . Headache(784.0)  migraines  . History of blood transfusion 06/2005  . History of gallstones   . History of pneumonia   . History of shingles 07/2011  . History of shingles   . HTN (hypertension)   . Itching   . Joint pain   . Muscle weakness   . Neuropathy   . Normal coronary arteries 2003  . Osteoarthropathy   . Personal history of chemotherapy 2018  . Personal history of radiation therapy 2018  . PFO (patent foramen ovale)   . Presence of inferior vena cava filter   . Pulmonary embolism ([place])   . Sleep apnea    used CPAP until after bypass surg.  . Status post gastric bypass for obesity   . Stomach pain   . Stroke [facility]) 12/2002   right-sided weakness  . Urinary incontinence     PAST SURGICAL HISTORY: Past Surgical History:  Procedure Laterality Date  . ABDOMINAL HYSTERECTOMY  11/1997   complete  . ANTERIOR CERVICAL DECOMP/DISCECTOMY FUSION  02/05/2005   C5-6  . APPENDECTOMY  10/22/2008   laparoscopic  . BREAST LUMPECTOMY Left 07/2016  . BREAST LUMPECTOMY WITH RADIOACTIVE  SEED AND SENTINEL LYMPH NODE BIOPSY Left 07/26/2016   Procedure: BREAST LUMPECTOMY WITH RADIOACTIVE SEED AND SENTINEL LYMPH NODE BIOPSY;  Surgeon: Erroll Luna, MD;  Location: [place];  Service: General;  Laterality: Left;  . BUNIONECTOMY  05/1980   both feet  . BUNIONECTOMY  08/2011   left foot  . CARDIAC CATHETERIZATION  03/04/2002  . CARPAL TUNNEL RELEASE  06/21/2009   right  . CARPAL TUNNEL RELEASE     left hand  . CARPAL TUNNEL RELEASE  10/03/2011   Procedure: CARPAL TUNNEL RELEASE;  Surgeon: Wynonia Sours, MD;  Location: [place];  Service: Orthopedics;  Laterality: Right;  CARPAL TUNNEL WITH HYPOTHENAR FAT PAD TRANSFER  . CERVICAL SPINE SURGERY  01/2005   titanium plate implanted  . CHOLECYSTECTOMY  1990  . ELBOW SURGERY  08/09/2004   decompression ulnar nerve right elbow  . ENTEROLYSIS  10/22/2008   laparoscopic abd. enterolysis  . GASTRIC ROUX-EN-Y  2009  . HEEL SPUR SURGERY  08/1997   left  . HEMORRHOID SURGERY  03/1993  . LAPAROSCOPIC LYSIS INTESTINAL ADHESIONS  02/14/2000  . NAILBED REPAIR  01/10/2005; 08/2011   exc. matrix bilat. great toe  . OTHER SURGICAL HISTORY  12/1986   pt states that she had surgery to unclog her fallopean tubes  . PORTACATH PLACEMENT N/A 09/24/2016   Procedure: INSERTION PORT-A-CATH WITH [name];  Surgeon: Erroll Luna, MD;  Location: [place];  Service: General;  Laterality: N/A;  . SHOULDER SURGERY     bilat. - (left:  06/2005)  . TONSILLECTOMY  07/1995  . TRIGGER FINGER RELEASE  04/25/2006   decompression A-1 pulley left thumb  . TRIGGER FINGER RELEASE Right 11/11/2018   Procedure: RELEASE TRIGGER FINGER/A-1 PULLEY RIGHT THUMB, RIGHT RING FINGER;  Surgeon: Daryll Brod, MD;  Location: [place];  Service: Orthopedics;  Laterality: Right;  . UTERINE FIBROID SURGERY  12/95, 7/96   x2  . VENA CAVA FILTER PLACEMENT  2009   during Roux-en-Y surg.    SOCIAL HISTORY: Social History   Tobacco Use  . Smoking  status: Former Research scientist (life sciences)  . Smokeless tobacco: Never Used  . Tobacco comment: quit smoking 08/1989  Substance Use Topics  . Alcohol use: No  . Drug use: No    FAMILY HISTORY: Family History  Problem Relation Age of  Onset  . Breast cancer Paternal Aunt 21  . Cervical cancer Paternal Grandmother 50       d.40s  . Ovarian cancer Maternal Grandmother 23       d.23  . Colon polyps Father   . Diabetes Father        borderline  . Prostate cancer Father   . High blood pressure Father   . Hypertension Mother   . Hypertension Other   . Breast cancer Sister 30       treated with neoadjuvant chemo/radiation and lumpectomy    ROS: Review of Systems  Constitutional: Negative for weight loss.  Gastrointestinal: Positive for constipation. Negative for melena, nausea and vomiting.       Negative for hematochezia  Musculoskeletal:       Negative for muscle weakness    PHYSICAL EXAM: Blood pressure (!) 149/88, pulse 69, temperature 99.3 F (37.4 C), height _0  (1.549 m), weight 205 lb (93 kg), SpO2 99 %. Body mass index is 38.73 kg/m. Physical Exam Vitals signs reviewed.  Constitutional:      Appearance: Normal appearance. She is well-developed. She is obese.  Cardiovascular:     Rate and Rhythm: Normal rate.  Pulmonary:     Effort: Pulmonary effort is normal.  Musculoskeletal: Normal range of motion.  Skin:    General: Skin is warm and dry.  Neurological:     Mental Status: She is alert and oriented to person, place, and time.  Psychiatric:        Mood and Affect: Mood normal.        Behavior: Behavior normal.     RECENT LABS AND TESTS: BMET    Component Value Date/Time   NA 140 12/17/2018 1539   NA 138 04/19/2017 1005   K 4.0 12/17/2018 1539   K 4.0 04/19/2017 1005   CL 102 12/17/2018 1539   CO2 20 12/17/2018 1539   CO2 25 04/19/2017 1005   GLUCOSE 65 12/17/2018 1539   GLUCOSE 80 11/26/2017 1544   GLUCOSE 87 04/19/2017 1005   BUN 14 12/17/2018 1539   BUN 12.0  04/19/2017 1005   CREATININE 0.68 12/17/2018 1539   CREATININE 0.77 11/26/2017 1544   CREATININE 0.7 04/19/2017 1005   CALCIUM 9.1 12/17/2018 1539   CALCIUM 8.9 04/19/2017 1005   GFRNONAA 98 12/17/2018 1539   GFRNONAA >60 11/26/2017 1544   GFRAA 113 12/17/2018 1539   GFRAA >60 11/26/2017 1544   Lab Results  Component Value Date   HGBA1C 5.6 12/17/2018   HGBA1C 5.7 (H) 06/10/2018   HGBA1C 5.8 (H) 02/18/2018   Lab Results  Component Value Date   INSULIN 4.9 02/18/2018   CBC    Component Value Date/Time   WBC 4.3 11/26/2017 1544   WBC 3.5 (L) 04/19/2017 1005   WBC 7.5 12/14/2016 0120   RBC 3.62 (L) 11/26/2017 1544   HGB 11.5 (L) 11/26/2017 1544   HGB 10.2 (L) 04/19/2017 1005   HCT 34.8 11/26/2017 1544   HCT 31.1 (L) 04/19/2017 1005   PLT 192 11/26/2017 1544   PLT 184 04/19/2017 1005   MCV 96.1 11/26/2017 1544   MCV 97.5 04/19/2017 1005   MCH 31.8 11/26/2017 1544   MCHC 33.0 11/26/2017 1544   RDW 14.5 11/26/2017 1544   RDW 15.3 (H) 04/19/2017 1005   LYMPHSABS 1.3 11/26/2017 1544   LYMPHSABS 1.0 04/19/2017 1005   MONOABS 0.4 11/26/2017 1544   MONOABS 0.3 04/19/2017 1005   EOSABS 0.1 11/26/2017 1544  EOSABS 0.1 04/19/2017 1005   BASOSABS 0.0 11/26/2017 1544   BASOSABS 0.0 04/19/2017 1005   Iron/TIBC/Ferritin/ %Sat No results found for: IRON, TIBC, FERRITIN, IRONPCTSAT Lipid Panel     Component Value Date/Time   CHOL 210 (H) 11/07/2018 0914   TRIG 61 11/07/2018 0914   HDL 84 11/07/2018 0914   CHOLHDL 2.5 11/07/2018 0914   CHOLHDL 2.6 09/30/2015 1838   VLDL 12 09/30/2015 1838   LDLCALC 114 (H) 11/07/2018 0914   Hepatic Function Panel     Component Value Date/Time   PROT 7.5 12/17/2018 1539   PROT 6.7 04/19/2017 1005   ALBUMIN 4.8 12/17/2018 1539   ALBUMIN 3.9 04/19/2017 1005   AST 16 12/17/2018 1539   AST 17 11/26/2017 1544   AST 14 04/19/2017 1005   ALT 14 12/17/2018 1539   ALT 14 11/26/2017 1544   ALT 11 04/19/2017 1005   ALKPHOS 102 12/17/2018  1539   ALKPHOS 50 04/19/2017 1005   BILITOT 0.5 12/17/2018 1539   BILITOT 0.4 11/26/2017 1544   BILITOT 0.31 04/19/2017 1005      Component Value Date/Time   TSH 0.802 02/18/2018 1029     Ref. Range 12/17/2018 15:39  Vitamin D, 25-Hydroxy Latest Ref Range: 30.0 - 100.0 ng/mL 17.3 (L)    OBESITY BEHAVIORAL INTERVENTION VISIT  Today's visit was # 12   Starting weight: 204 lbs Starting date: 02/18/2018 Today's weight : 205 lbs Today's date: 01/15/2019 Total lbs lost to date: 0    01/15/2019  Height _0  (1.549 m)  Weight 205 lb (93 kg)  BMI (Calculated) 38.75  BLOOD PRESSURE - SYSTOLIC 115  BLOOD PRESSURE - DIASTOLIC 88   Body Fat % 72.6 %    ASK: We discussed the diagnosis of obesity with [name] today and Queena agreed to give [name] permission to discuss obesity behavioral modification therapy today.  ASSESS: Nara has the diagnosis of obesity and her BMI today is 38.75 Kimbella is in the action stage of change   ADVISE: Alzora was educated on the multiple health risks of obesity as well as the benefit of weight loss to improve her health. She was advised of the need for long term treatment and the importance of lifestyle modifications to improve her current health and to decrease her risk of future health problems.  AGREE: Multiple dietary modification options and treatment options were discussed and  Aalina agreed to follow the recommendations documented in the above note.  ARRANGE: Deajah was educated on the importance of frequent visits to treat obesity as outlined per CMS and USPSTF guidelines and agreed to schedule her next follow up appointment today.  Corey Skains, am acting as Location manager for [employer]. Owens Shark, DO  I have reviewed the above documentation for accuracy and completeness, and I agree with the above. -Jearld Lesch, DO

## 2019-01-21 ENCOUNTER — Encounter (HOSPITAL_COMMUNITY)
Admission: RE | Disposition: A | Discharge status: Home / Self Care | Payer: Self-pay | Source: Home / Self Care | Attending: Gastroenterology

## 2019-01-21 ENCOUNTER — Ambulatory Visit (HOSPITAL_COMMUNITY)
Admission: RE | Admit: 2019-01-21 | Discharge: 2019-01-21 | Disposition: A | Discharge status: Home / Self Care | Payer: Medicare Other | Attending: Gastroenterology | Admitting: Gastroenterology

## 2019-01-21 ENCOUNTER — Encounter (HOSPITAL_COMMUNITY): Payer: Self-pay

## 2019-01-21 DIAGNOSIS — K5900 Constipation, unspecified: Secondary | ICD-10-CM | POA: Diagnosis not present

## 2019-01-21 HISTORY — PX: ANAL RECTAL MANOMETRY: SHX6358

## 2019-01-21 SURGERY — MANOMETRY, ANORECTAL

## 2019-01-21 NOTE — Progress Notes (Signed)
Anal rectal manometry performed per protocol.  Balloon expulsion test performed per protocol.  Patient able to expel balloon at 30 seconds.  Dr. Dema Severin notified to read study.

## 2019-01-22 ENCOUNTER — Encounter (HOSPITAL_COMMUNITY): Payer: Self-pay | Admitting: Gastroenterology

## 2019-01-23 MED FILL — VIT D3-50 50,000 UNITS CAPS: 1.25 MG | 28 days supply | Qty: 4 | Fill #0

## 2019-01-27 ENCOUNTER — Ambulatory Visit
Admission: RE | Admit: 2019-01-27 | Discharge: 2019-01-27 | Disposition: A | Discharge status: Home / Self Care | Payer: Medicare Other | Source: Ambulatory Visit | Attending: Physician Assistant | Admitting: Physician Assistant

## 2019-01-27 DIAGNOSIS — G8929 Other chronic pain: Secondary | ICD-10-CM

## 2019-01-27 DIAGNOSIS — M25511 Pain in right shoulder: Secondary | ICD-10-CM | POA: Diagnosis not present

## 2019-01-30 DIAGNOSIS — K5900 Constipation, unspecified: Secondary | ICD-10-CM | POA: Diagnosis not present

## 2019-02-02 DIAGNOSIS — M47816 Spondylosis without myelopathy or radiculopathy, lumbar region: Secondary | ICD-10-CM | POA: Diagnosis not present

## 2019-02-02 DIAGNOSIS — M545 Low back pain: Secondary | ICD-10-CM | POA: Diagnosis not present

## 2019-02-02 DIAGNOSIS — M5136 Other intervertebral disc degeneration, lumbar region: Secondary | ICD-10-CM | POA: Diagnosis not present

## 2019-02-05 ENCOUNTER — Ambulatory Visit (INDEPENDENT_AMBULATORY_CARE_PROVIDER_SITE_OTHER): Payer: Medicare Other | Admitting: Bariatrics

## 2019-02-05 ENCOUNTER — Encounter (INDEPENDENT_AMBULATORY_CARE_PROVIDER_SITE_OTHER): Payer: Self-pay | Admitting: Bariatrics

## 2019-02-05 ENCOUNTER — Other Ambulatory Visit: Payer: Self-pay

## 2019-02-05 VITALS — BP 107/70 | HR 53 | Temp 97.7°F | Ht 61.0 in | Wt 206.0 lb

## 2019-02-05 DIAGNOSIS — E8881 Metabolic syndrome: Secondary | ICD-10-CM | POA: Diagnosis not present

## 2019-02-05 DIAGNOSIS — K5909 Other constipation: Secondary | ICD-10-CM

## 2019-02-05 DIAGNOSIS — E559 Vitamin D deficiency, unspecified: Secondary | ICD-10-CM

## 2019-02-05 DIAGNOSIS — Z6838 Body mass index (BMI) 38.0-38.9, adult: Secondary | ICD-10-CM

## 2019-02-06 ENCOUNTER — Telehealth: Payer: Self-pay | Admitting: Cardiovascular Disease

## 2019-02-06 ENCOUNTER — Encounter: Payer: Self-pay | Admitting: Cardiovascular Disease

## 2019-02-06 NOTE — Telephone Encounter (Signed)
Patient says that she was told by Dr. Loletha Grayer that if she ever had any surgery, that she would need to anticoagulation therapy. Advised that this message would be sent to Dr. Loletha Grayer. And pharmacist.

## 2019-02-06 NOTE — Telephone Encounter (Signed)
Letter sent via epic to Dr. Veverly Fells. I spoke to [name] today.

## 2019-02-06 NOTE — Telephone Encounter (Signed)
New Message:      Pt  Calling to see if her My Chart message from 02-03-19 was received and if Dr Veverly Fells was contacted?

## 2019-02-10 NOTE — Progress Notes (Signed)
Office: [phone]  /  Fax: [phone]   HPI:   Chief Complaint: OBESITY [name] is here to discuss her progress with her obesity treatment plan. She is on the Category 2 plan + 100 calories and is following her eating plan approximately 97% of the time. She states she is walking 30-45 minutes 5 times per week. [name] is up 1 lb and not doing well overall. She anal manometry on 01/21/2019 showing decreased sphincter tone as well as incomplete relaxation. Consideration will be given for referral for pelvic floor physical therapy for biofeedback. Her weight is 206 lb (93.4 kg) today and has had a weight gain of 1 lb since her last visit. She has lost 0 lbs since starting treatment with [name].  Insulin Resistance [name] has a diagnosis of insulin resistance based on her elevated fasting insulin level >5. Last A1c 5.6 on 12/17/2018. Although Swan's blood glucose readings are still under good control, insulin resistance puts her at greater risk of metabolic syndrome and diabetes. She is not taking metformin currently and continues to work on diet and exercise to decrease risk of diabetes. No polyphagia.  Vitamin D deficiency [name] has a diagnosis of Vitamin D deficiency. Last Vitamin D 17.3 on 12/17/2018. She is currently taking Vit D and denies nausea, vomiting or muscle weakness.  Constipation [name] notes constipation and is taking Linzess and MiraLax daily.   ASSESSMENT AND PLAN:  Insulin resistance  Vitamin D deficiency  Other constipation  Class 2 severe obesity with serious comorbidity and body mass index (BMI) of 38.0 to 38.9 in adult, unspecified obesity type ([place])  PLAN:  Insulin Resistance [name] will continue to work on weight loss, exercise, and decreasing simple carbohydrates in her diet to help decrease the risk of diabetes. We dicussed metformin including benefits and risks. She was informed that eating too many simple carbohydrates or too many calories  at one sitting increases the likelihood of GI side effects. [name] was instructed to decrease carbohydrates, increase protein, and continue activities. She will follow-up with [name] as directed to monitor her progress.  Vitamin D Deficiency [name] was informed that low Vitamin D levels contributes to fatigue and are associated with obesity, breast, and colon cancer. She agrees to continue taking Vit D and will follow-up for routine testing of Vitamin D, at least 2-3 times per year. She was informed of the risk of over-replacement of Vitamin D and agrees to not increase her dose unless she discusses this with [name] first. [name] agrees to follow-up with our clinic in 2 weeks.  Constipation [name] was informed decrease bowel movement frequency is normal while losing weight, but stools should not be hard or painful. She was advised to increase her H20 intake and work on increasing her fiber intake. High fiber foods were discussed today. She will continue taking Linzess and MiraLax daily.  I spent > than 50% of the 15 minute visit on counseling as documented in the note.  Obesity [name] is currently in the action stage of change. As such, her goal is to continue with weight loss efforts. She has agreed to follow the Category 2 plan + 100 calories. [name] will work on meal planning and intentional eating.  She will come in 15 minutes early next visit for an IC check. [name] has been instructed to continue walking 5 days a week for weight loss and overall health benefits. We discussed the following Behavioral Modification Strategies today: increasing lean protein intake, decreasing simple carbohydrates, increasing vegetables, increase H20  intake, decrease eating out, no skipping meals, work on meal planning and easy cooking plans, keeping healthy foods in the home, and planning for success.  [name] has agreed to follow-up with our clinic in 2 weeks. She was informed of the importance of  frequent follow-up visits to maximize her success with intensive lifestyle modifications for her multiple health conditions.  ALLERGIES: Allergies  Allergen Reactions  . Aspirin Other (See Comments)    ESOPHAGITIS  . Oxycodone Hcl Diarrhea and Nausea And Vomiting  . Propoxyphene N-Acetaminophen Diarrhea and Nausea And Vomiting  . Tramadol Other (See Comments)    Cause migraines  . Adhesive [Tape] Rash and Other (See Comments)    Pulls skin off - please use paper tape  . Prednisone Rash    MEDICATIONS: Current Outpatient Medications on File Prior to Visit  Medication Sig Dispense Refill  . albuterol (PROAIR HFA) 108 (90 Base) MCG/ACT inhaler Inhale 2 puffs into the lungs every 6 (six) hours as needed for wheezing or shortness of breath.    Marland Kitchen aspirin EC 81 MG tablet Take 81 mg by mouth daily.    . Calcium Citrate-Vitamin D (CALCIUM CITRATE +D PO) Take 2 tablets by mouth daily. Calcium 600 mg     . Cholecalciferol (VITAMIN D3) 1.25 MG (50000 UT) CAPS Take 1 capsule by mouth every 7 (seven) days. 4 capsule 0  . hydrOXYzine (ATARAX/VISTARIL) 25 MG tablet Take 25 mg by mouth 3 (three) times daily as needed.    Marland Kitchen letrozole (FEMARA) 2.5 MG tablet TAKE 1 TABLET (2.5 MG TOTAL) BY MOUTH DAILY. 90 tablet 1  . linaclotide (LINZESS) 290 MCG CAPS capsule Take 290 mcg by mouth daily before breakfast.    . Methocarbamol (ROBAXIN-750 PO) Robaxin    . mometasone (ELOCON) 0.1 % cream Apply 1 application topically daily as needed (rash - summer eczema). Apply to arms    . Multiple Vitamin (MULTIVITAMIN WITH MINERALS) TABS tablet Take 1 tablet by mouth at bedtime.    . silver sulfADIAZINE (SILVADENE) 1 % cream Apply 1 application topically 2 (two) times daily as needed (stomach tears). 50 g 0  . solifenacin (VESICARE) 10 MG tablet     . valACYclovir (VALTREX) 1000 MG tablet Take 1 tablet (1,000 mg total) by mouth 3 (three) times daily. 21 tablet 0   No current facility-administered medications on file  prior to visit.     PAST MEDICAL HISTORY: Past Medical History:  Diagnosis Date  . Anemia    since bypass  . Arthritis    osteoarthritis  . Asthma    states no asthma attack since 2002  . Back pain   . Breast cancer ([place]) 06/26/16 bx   left breast  . Breast cancer ([place])   . Chronic back pain   . Complication of anesthesia    states takes more than normal to put her to sleep  . Constipation   . Dental bridge present    upper  . Dental crowns present   . DVT of upper extremity (deep vein thrombosis) ([place])   . Fibromyalgia   . Genetic testing 09/19/2016   Ms. Brashier underwent genetic counseling and testing for hereditary cancer syndromes on 08/21/2016. Her results were negative for mutations in all 46 genes analyzed by [facility]'s 46-gene Common Hereditary Cancers Panel. Genes analyzed include: APC, ATM, AXIN2, BARD1, BMPR1A, BRCA1, BRCA2, BRIP1, CDH1, CDKN2A, CHEK2, CTNNA1, DICER1, EPCAM, GREM1, HOXB13, KIT, MEN1, MLH1, MSH2, MSH3, MSH6, MUTYH, NBN,   . Headache(784.0)  migraines  . History of blood transfusion 06/2005  . History of gallstones   . History of pneumonia   . History of shingles 07/2011  . History of shingles   . HTN (hypertension)   . Itching   . Joint pain   . Muscle weakness   . Neuropathy   . Normal coronary arteries 2003  . Osteoarthropathy   . Personal history of chemotherapy 2018  . Personal history of radiation therapy 2018  . PFO (patent foramen ovale)   . Presence of inferior vena cava filter   . Pulmonary embolism ([place])   . Sleep apnea    used CPAP until after bypass surg.  . Status post gastric bypass for obesity   . Stomach pain   . Stroke [facility]) 12/2002   right-sided weakness  . Urinary incontinence     PAST SURGICAL HISTORY: Past Surgical History:  Procedure Laterality Date  . ABDOMINAL HYSTERECTOMY  11/1997   complete  . ANAL RECTAL MANOMETRY N/A 01/21/2019   Procedure: ANO RECTAL MANOMETRY;  Surgeon: Arta Silence, MD;  Location: [facility]  ENDOSCOPY;  Service: Endoscopy;  Laterality: N/A;  . ANTERIOR CERVICAL DECOMP/DISCECTOMY FUSION  02/05/2005   C5-6  . APPENDECTOMY  10/22/2008   laparoscopic  . BREAST LUMPECTOMY Left 07/2016  . BREAST LUMPECTOMY WITH RADIOACTIVE SEED AND SENTINEL LYMPH NODE BIOPSY Left 07/26/2016   Procedure: BREAST LUMPECTOMY WITH RADIOACTIVE SEED AND SENTINEL LYMPH NODE BIOPSY;  Surgeon: Erroll Luna, MD;  Location: [place];  Service: General;  Laterality: Left;  . BUNIONECTOMY  05/1980   both feet  . BUNIONECTOMY  08/2011   left foot  . CARDIAC CATHETERIZATION  03/04/2002  . CARPAL TUNNEL RELEASE  06/21/2009   right  . CARPAL TUNNEL RELEASE     left hand  . CARPAL TUNNEL RELEASE  10/03/2011   Procedure: CARPAL TUNNEL RELEASE;  Surgeon: Wynonia Sours, MD;  Location: [place];  Service: Orthopedics;  Laterality: Right;  CARPAL TUNNEL WITH HYPOTHENAR FAT PAD TRANSFER  . CERVICAL SPINE SURGERY  01/2005   titanium plate implanted  . CHOLECYSTECTOMY  1990  . ELBOW SURGERY  08/09/2004   decompression ulnar nerve right elbow  . ENTEROLYSIS  10/22/2008   laparoscopic abd. enterolysis  . GASTRIC ROUX-EN-Y  2009  . HEEL SPUR SURGERY  08/1997   left  . HEMORRHOID SURGERY  03/1993  . LAPAROSCOPIC LYSIS INTESTINAL ADHESIONS  02/14/2000  . NAILBED REPAIR  01/10/2005; 08/2011   exc. matrix bilat. great toe  . OTHER SURGICAL HISTORY  12/1986   pt states that she had surgery to unclog her fallopean tubes  . PORTACATH PLACEMENT N/A 09/24/2016   Procedure: INSERTION PORT-A-CATH WITH [name];  Surgeon: Erroll Luna, MD;  Location: [place];  Service: General;  Laterality: N/A;  . SHOULDER SURGERY     bilat. - (left:  06/2005)  . TONSILLECTOMY  07/1995  . TRIGGER FINGER RELEASE  04/25/2006   decompression A-1 pulley left thumb  . TRIGGER FINGER RELEASE Right 11/11/2018   Procedure: RELEASE TRIGGER FINGER/A-1 PULLEY RIGHT THUMB, RIGHT RING FINGER;  Surgeon: Daryll Brod, MD;  Location: [place];  Service: Orthopedics;  Laterality: Right;  . UTERINE FIBROID SURGERY  12/95, 7/96   x2  . VENA CAVA FILTER PLACEMENT  2009   during Roux-en-Y surg.    SOCIAL HISTORY: Social History   Tobacco Use  . Smoking status: Former Research scientist (life sciences)  . Smokeless tobacco: Never Used  . Tobacco comment: quit  smoking 08/1989  Substance Use Topics  . Alcohol use: No  . Drug use: No    FAMILY HISTORY: Family History  Problem Relation Age of Onset  . Breast cancer Paternal Aunt 30  . Cervical cancer Paternal Grandmother 50       d.40s  . Ovarian cancer Maternal Grandmother 23       d.23  . Colon polyps Father   . Diabetes Father        borderline  . Prostate cancer Father   . High blood pressure Father   . Hypertension Mother   . Hypertension Other   . Breast cancer Sister 72       treated with neoadjuvant chemo/radiation and lumpectomy   ROS: Review of Systems  Gastrointestinal: Positive for constipation. Negative for nausea and vomiting.  Musculoskeletal:       Negative for muscle weakness.  Endo/Heme/Allergies:       Negative for polyphagia.   PHYSICAL EXAM: Blood pressure 107/70, pulse (!) 53, temperature 97.7 F (36.5 C), temperature source Oral, height 5' 1"  (1.549 m), weight 206 lb (93.4 kg), SpO2 97 %. Body mass index is 38.92 kg/m. Physical Exam Vitals signs reviewed.  Constitutional:      Appearance: Normal appearance. She is obese.  Cardiovascular:     Rate and Rhythm: Normal rate.     Pulses: Normal pulses.  Pulmonary:     Effort: Pulmonary effort is normal.     Breath sounds: Normal breath sounds.  Musculoskeletal: Normal range of motion.  Skin:    General: Skin is warm and dry.  Neurological:     Mental Status: She is alert and oriented to person, place, and time.  Psychiatric:        Behavior: Behavior normal.   RECENT LABS AND TESTS: BMET    Component Value Date/Time   NA 140 12/17/2018 1539   NA 138 04/19/2017 1005   K 4.0 12/17/2018 1539   K  4.0 04/19/2017 1005   CL 102 12/17/2018 1539   CO2 20 12/17/2018 1539   CO2 25 04/19/2017 1005   GLUCOSE 65 12/17/2018 1539   GLUCOSE 80 11/26/2017 1544   GLUCOSE 87 04/19/2017 1005   BUN 14 12/17/2018 1539   BUN 12.0 04/19/2017 1005   CREATININE 0.68 12/17/2018 1539   CREATININE 0.77 11/26/2017 1544   CREATININE 0.7 04/19/2017 1005   CALCIUM 9.1 12/17/2018 1539   CALCIUM 8.9 04/19/2017 1005   GFRNONAA 98 12/17/2018 1539   GFRNONAA >60 11/26/2017 1544   GFRAA 113 12/17/2018 1539   GFRAA >60 11/26/2017 1544   Lab Results  Component Value Date   HGBA1C 5.6 12/17/2018   HGBA1C 5.7 (H) 06/10/2018   HGBA1C 5.8 (H) 02/18/2018   Lab Results  Component Value Date   INSULIN 4.9 02/18/2018   CBC    Component Value Date/Time   WBC 4.3 11/26/2017 1544   WBC 3.5 (L) 04/19/2017 1005   WBC 7.5 12/14/2016 0120   RBC 3.62 (L) 11/26/2017 1544   HGB 11.5 (L) 11/26/2017 1544   HGB 10.2 (L) 04/19/2017 1005   HCT 34.8 11/26/2017 1544   HCT 31.1 (L) 04/19/2017 1005   PLT 192 11/26/2017 1544   PLT 184 04/19/2017 1005   MCV 96.1 11/26/2017 1544   MCV 97.5 04/19/2017 1005   MCH 31.8 11/26/2017 1544   MCHC 33.0 11/26/2017 1544   RDW 14.5 11/26/2017 1544   RDW 15.3 (H) 04/19/2017 1005   LYMPHSABS 1.3 11/26/2017 1544   LYMPHSABS  1.0 04/19/2017 1005   MONOABS 0.4 11/26/2017 1544   MONOABS 0.3 04/19/2017 1005   EOSABS 0.1 11/26/2017 1544   EOSABS 0.1 04/19/2017 1005   BASOSABS 0.0 11/26/2017 1544   BASOSABS 0.0 04/19/2017 1005   Iron/TIBC/Ferritin/ %Sat No results found for: IRON, TIBC, FERRITIN, IRONPCTSAT Lipid Panel     Component Value Date/Time   CHOL 210 (H) 11/07/2018 0914   TRIG 61 11/07/2018 0914   HDL 84 11/07/2018 0914   CHOLHDL 2.5 11/07/2018 0914   CHOLHDL 2.6 09/30/2015 1838   VLDL 12 09/30/2015 1838   LDLCALC 114 (H) 11/07/2018 0914   Hepatic Function Panel     Component Value Date/Time   PROT 7.5 12/17/2018 1539   PROT 6.7 04/19/2017 1005   ALBUMIN 4.8  12/17/2018 1539   ALBUMIN 3.9 04/19/2017 1005   AST 16 12/17/2018 1539   AST 17 11/26/2017 1544   AST 14 04/19/2017 1005   ALT 14 12/17/2018 1539   ALT 14 11/26/2017 1544   ALT 11 04/19/2017 1005   ALKPHOS 102 12/17/2018 1539   ALKPHOS 50 04/19/2017 1005   BILITOT 0.5 12/17/2018 1539   BILITOT 0.4 11/26/2017 1544   BILITOT 0.31 04/19/2017 1005      Component Value Date/Time   TSH 0.802 02/18/2018 1029   Results for LLEWELLYN, SCHOENBERGER (MRN [id number]) as of 02/10/2019 11:46  Ref. Range 12/17/2018 15:39  Vitamin D, 25-Hydroxy Latest Ref Range: 30.0 - 100.0 ng/mL 17.3 (L)   OBESITY BEHAVIORAL INTERVENTION VISIT  Today's visit was #13   Starting weight: 204 lbs Starting date: 02/18/2018 Today's weight: 206 lbs  Today's date: 02/05/2019 Total lbs lost to date: 0    02/05/2019  Height 5' 1"  (1.549 m)  Weight 206 lb (93.4 kg)  BMI (Calculated) 38.94  BLOOD PRESSURE - SYSTOLIC 409  BLOOD PRESSURE - DIASTOLIC 70   Body Fat % 81.1 %   ASK: We discussed the diagnosis of obesity with [name] today and Gilberto agreed to give [name] permission to discuss obesity behavioral modification therapy today.  ASSESS: Zakara has the diagnosis of obesity and her BMI today is 38.9. Mardie is in the action stage of change.   ADVISE: Laquida was educated on the multiple health risks of obesity as well as the benefit of weight loss to improve her health. She was advised of the need for long term treatment and the importance of lifestyle modifications to improve her current health and to decrease her risk of future health problems.  AGREE: Multiple dietary modification options and treatment options were discussed and  Jadie agreed to follow the recommendations documented in the above note.  ARRANGE: Brielle was educated on the importance of frequent visits to treat obesity as outlined per CMS and USPSTF guidelines and agreed to schedule her next follow up appointment today.  Migdalia Dk, am acting as Location manager for [employer], DO   I have reviewed the above documentation for accuracy and completeness, and I agree with the above. -Jearld Lesch, DO

## 2019-02-11 ENCOUNTER — Encounter (INDEPENDENT_AMBULATORY_CARE_PROVIDER_SITE_OTHER): Payer: Self-pay | Admitting: Bariatrics

## 2019-02-11 NOTE — Telephone Encounter (Signed)
Letter sent via epic fax function

## 2019-02-11 NOTE — Telephone Encounter (Signed)
Ms. Craycraft called today and states that Dr. Veverly Fells has not received any paperwork from Dr. Loletha Grayer yet. Please re-submit paperwork so the patient can be scheduled for shoulder surgery.

## 2019-02-11 NOTE — Telephone Encounter (Signed)
Please Re-Fax to Dr. Veverly Fells at [phone]. Dr. Veverly Fells will be made aware to check Epic for the paperwork, but he does not use Epic regularly. Thank you!

## 2019-02-17 DIAGNOSIS — M25511 Pain in right shoulder: Secondary | ICD-10-CM | POA: Diagnosis not present

## 2019-02-17 DIAGNOSIS — Z0120 Encounter for dental examination and cleaning without abnormal findings: Secondary | ICD-10-CM | POA: Diagnosis not present

## 2019-02-17 DIAGNOSIS — M19011 Primary osteoarthritis, right shoulder: Secondary | ICD-10-CM | POA: Diagnosis not present

## 2019-02-17 DIAGNOSIS — M75121 Complete rotator cuff tear or rupture of right shoulder, not specified as traumatic: Secondary | ICD-10-CM | POA: Diagnosis not present

## 2019-02-18 ENCOUNTER — Encounter (INDEPENDENT_AMBULATORY_CARE_PROVIDER_SITE_OTHER): Payer: Self-pay | Admitting: Bariatrics

## 2019-02-18 ENCOUNTER — Other Ambulatory Visit: Payer: Self-pay

## 2019-02-18 ENCOUNTER — Ambulatory Visit (INDEPENDENT_AMBULATORY_CARE_PROVIDER_SITE_OTHER): Payer: Medicare Other | Admitting: Bariatrics

## 2019-02-18 ENCOUNTER — Encounter: Payer: Self-pay | Admitting: Bariatrics

## 2019-02-18 VITALS — BP 100/68 | HR 73 | Temp 98.5°F | Ht 61.0 in | Wt 204.0 lb

## 2019-02-18 DIAGNOSIS — R0602 Shortness of breath: Secondary | ICD-10-CM | POA: Diagnosis not present

## 2019-02-18 DIAGNOSIS — E559 Vitamin D deficiency, unspecified: Secondary | ICD-10-CM

## 2019-02-18 DIAGNOSIS — R5383 Other fatigue: Secondary | ICD-10-CM

## 2019-02-18 DIAGNOSIS — Z6838 Body mass index (BMI) 38.0-38.9, adult: Secondary | ICD-10-CM

## 2019-02-18 MED ORDER — VITAMIN D3 1.25 MG (50000 UT) PO CAPS: 1.0000 | ORAL_CAPSULE | ORAL | 0 refills | Status: DC

## 2019-02-18 MED FILL — VIT D3-50 50,000 UNITS CAPS: 1.25 MG | 28 days supply | Qty: 4 | Fill #0

## 2019-02-18 NOTE — Progress Notes (Signed)
Office: [phone]  /  Fax: [phone]   HPI:   Chief Complaint: OBESITY [name] is here to discuss her progress with her obesity treatment plan. She is on the Category 2 plan + 100 calories and is following her eating plan approximately 95-98% of the time. She states she is walking 1 hour 5 times per week. [name] is down 2 lbs and doing well overall. She thinks that the exercise is helping. She reports doing well with her protein and water intake. Her weight is 204 lb (92.5 kg) today and has had a weight loss of 2 pounds over a period of 2 weeks since her last visit. She has lost 0 lbs since starting treatment with [name].  Vitamin D deficiency [name] has a diagnosis of Vitamin D deficiency. She is currently taking prescription Vit D and denies nausea, vomiting or muscle weakness.  Fatigue and Shortness of Breath [name] notes fatigue and shortness of breath with certain activities. IC was 5462 on 05/26/2018 with a result of 1826 today.  ASSESSMENT AND PLAN:  Vitamin D deficiency - Plan: Cholecalciferol (VITAMIN D3) 1.25 MG (50000 UT) CAPS  Other fatigue  Shortness of breath on exertion  Class 2 severe obesity with serious comorbidity and body mass index (BMI) of 38.0 to 38.9 in adult, unspecified obesity type ([place])  PLAN:  Vitamin D Deficiency [name] was informed that low Vitamin D levels contributes to fatigue and are associated with obesity, breast, and colon cancer. She agrees to continue to take prescription Vit D @ 50,000 IU every week #4 with 0 refills and will follow-up for routine testing of Vitamin D, at least 2-3 times per year. She was informed of the risk of over-replacement of Vitamin D and agrees to not increase her dose unless she discusses this with [name] first. [name] agrees to follow-up with our clinic in 2 weeks.  Fatigue and Shortness of Breath [name] was informed that her fatigue may be related to obesity, depression or many other causes. She was  instructed to gradually increase her exercise.  Obesity [name] is currently in the action stage of change. As such, her goal is to continue with weight loss efforts. She has agreed to follow a lower carbohydrate, vegetable and lean protein rich diet plan. [name] will work on meal planning and intentional eating. [name] has been instructed to increase walking (frequency and timing) for weight loss and overall health benefits. We discussed the following Behavioral Modification Strategies today: increasing lean protein intake, decreasing simple carbohydrates, increasing vegetables, increase H20 intake, decrease eating out, no skipping meals, work on meal planning and easy cooking plans, keeping healthy foods in the home, better snacking choices, and planning for success.  [name] has agreed to follow-up with our clinic in 2 weeks. She was informed of the importance of frequent follow-up visits to maximize her success with intensive lifestyle modifications for her multiple health conditions.  ALLERGIES: Allergies  Allergen Reactions  . Aspirin Other (See Comments)    ESOPHAGITIS  . Oxycodone Hcl Diarrhea and Nausea And Vomiting  . Propoxyphene N-Acetaminophen Diarrhea and Nausea And Vomiting  . Tramadol Other (See Comments)    Cause migraines  . Adhesive [Tape] Rash and Other (See Comments)    Pulls skin off - please use paper tape  . Prednisone Rash    MEDICATIONS: Current Outpatient Medications on File Prior to Visit  Medication Sig Dispense Refill  . albuterol (PROAIR HFA) 108 (90 Base) MCG/ACT inhaler Inhale 2 puffs into the lungs every 6 (  six) hours as needed for wheezing or shortness of breath.    Marland Kitchen aspirin EC 81 MG tablet Take 81 mg by mouth daily.    . Calcium Citrate-Vitamin D (CALCIUM CITRATE +D PO) Take 2 tablets by mouth daily. Calcium 600 mg     . hydrOXYzine (ATARAX/VISTARIL) 25 MG tablet Take 25 mg by mouth 3 (three) times daily as needed.    Marland Kitchen letrozole (FEMARA)  2.5 MG tablet TAKE 1 TABLET (2.5 MG TOTAL) BY MOUTH DAILY. 90 tablet 1  . linaclotide (LINZESS) 290 MCG CAPS capsule Take 290 mcg by mouth daily before breakfast.    . Methocarbamol (ROBAXIN-750 PO) Robaxin    . mometasone (ELOCON) 0.1 % cream Apply 1 application topically daily as needed (rash - summer eczema). Apply to arms    . Multiple Vitamin (MULTIVITAMIN WITH MINERALS) TABS tablet Take 1 tablet by mouth at bedtime.    . silver sulfADIAZINE (SILVADENE) 1 % cream Apply 1 application topically 2 (two) times daily as needed (stomach tears). 50 g 0  . solifenacin (VESICARE) 10 MG tablet     . valACYclovir (VALTREX) 1000 MG tablet Take 1 tablet (1,000 mg total) by mouth 3 (three) times daily. 21 tablet 0   No current facility-administered medications on file prior to visit.     PAST MEDICAL HISTORY: Past Medical History:  Diagnosis Date  . Anemia    since bypass  . Arthritis    osteoarthritis  . Asthma    states no asthma attack since 2002  . Back pain   . Breast cancer ([place]) 06/26/16 bx   left breast  . Breast cancer ([place])   . Chronic back pain   . Complication of anesthesia    states takes more than normal to put her to sleep  . Constipation   . Dental bridge present    upper  . Dental crowns present   . DVT of upper extremity (deep vein thrombosis) ([place])   . Fibromyalgia   . Genetic testing 09/19/2016   [name] underwent genetic counseling and testing for hereditary cancer syndromes on 08/21/2016. Her results were negative for mutations in all 46 genes analyzed by [facility]'s 46-gene Common Hereditary Cancers Panel. Genes analyzed include: APC, ATM, AXIN2, BARD1, BMPR1A, BRCA1, BRCA2, BRIP1, CDH1, CDKN2A, CHEK2, CTNNA1, DICER1, EPCAM, GREM1, HOXB13, KIT, MEN1, MLH1, MSH2, MSH3, MSH6, MUTYH, NBN,   . Headache(784.0)    migraines  . History of blood transfusion 06/2005  . History of gallstones   . History of pneumonia   . History of shingles 07/2011  . History of shingles   .  HTN (hypertension)   . Itching   . Joint pain   . Muscle weakness   . Neuropathy   . Normal coronary arteries 2003  . Osteoarthropathy   . Personal history of chemotherapy 2018  . Personal history of radiation therapy 2018  . PFO (patent foramen ovale)   . Presence of inferior vena cava filter   . Pulmonary embolism ([place])   . Sleep apnea    used CPAP until after bypass surg.  . Status post gastric bypass for obesity   . Stomach pain   . Stroke [facility]) 12/2002   right-sided weakness  . Urinary incontinence     PAST SURGICAL HISTORY: Past Surgical History:  Procedure Laterality Date  . ABDOMINAL HYSTERECTOMY  11/1997   complete  . ANAL RECTAL MANOMETRY N/A 01/21/2019   Procedure: ANO RECTAL MANOMETRY;  Surgeon: Arta Silence, MD;  Location: [facility];  Service: Endoscopy;  Laterality: N/A;  . ANTERIOR CERVICAL DECOMP/DISCECTOMY FUSION  02/05/2005   C5-6  . APPENDECTOMY  10/22/2008   laparoscopic  . BREAST LUMPECTOMY Left 07/2016  . BREAST LUMPECTOMY WITH RADIOACTIVE SEED AND SENTINEL LYMPH NODE BIOPSY Left 07/26/2016   Procedure: BREAST LUMPECTOMY WITH RADIOACTIVE SEED AND SENTINEL LYMPH NODE BIOPSY;  Surgeon: Erroll Luna, MD;  Location: [facility];  Service: General;  Laterality: Left;  . BUNIONECTOMY  05/1980   both feet  . BUNIONECTOMY  08/2011   left foot  . CARDIAC CATHETERIZATION  03/04/2002  . CARPAL TUNNEL RELEASE  06/21/2009   right  . CARPAL TUNNEL RELEASE     left hand  . CARPAL TUNNEL RELEASE  10/03/2011   Procedure: CARPAL TUNNEL RELEASE;  Surgeon: Wynonia Sours, MD;  Location: [facility];  Service: Orthopedics;  Laterality: Right;  CARPAL TUNNEL WITH HYPOTHENAR FAT PAD TRANSFER  . CERVICAL SPINE SURGERY  01/2005   titanium plate implanted  . CHOLECYSTECTOMY  1990  . ELBOW SURGERY  08/09/2004   decompression ulnar nerve right elbow  . ENTEROLYSIS  10/22/2008   laparoscopic abd. enterolysis  . GASTRIC ROUX-EN-Y  2009  . HEEL SPUR  SURGERY  08/1997   left  . HEMORRHOID SURGERY  03/1993  . LAPAROSCOPIC LYSIS INTESTINAL ADHESIONS  02/14/2000  . NAILBED REPAIR  01/10/2005; 08/2011   exc. matrix bilat. great toe  . OTHER SURGICAL HISTORY  12/1986   pt states that she had surgery to unclog her fallopean tubes  . PORTACATH PLACEMENT N/A 09/24/2016   Procedure: INSERTION PORT-A-CATH WITH [name];  Surgeon: Erroll Luna, MD;  Location: [facility];  Service: General;  Laterality: N/A;  . SHOULDER SURGERY     bilat. - (left:  06/2005)  . TONSILLECTOMY  07/1995  . TRIGGER FINGER RELEASE  04/25/2006   decompression A-1 pulley left thumb  . TRIGGER FINGER RELEASE Right 11/11/2018   Procedure: RELEASE TRIGGER FINGER/A-1 PULLEY RIGHT THUMB, RIGHT RING FINGER;  Surgeon: Daryll Brod, MD;  Location: [facility];  Service: Orthopedics;  Laterality: Right;  . UTERINE FIBROID SURGERY  12/95, 7/96   x2  . VENA CAVA FILTER PLACEMENT  2009   during Roux-en-Y surg.    SOCIAL HISTORY: Social History   Tobacco Use  . Smoking status: Former Research scientist (life sciences)  . Smokeless tobacco: Never Used  . Tobacco comment: quit smoking 08/1989  Substance Use Topics  . Alcohol use: No  . Drug use: No    FAMILY HISTORY: Family History  Problem Relation Age of Onset  . Breast cancer Paternal Aunt 73  . Cervical cancer Paternal Grandmother 69       d.40s  . Ovarian cancer Maternal Grandmother 23       d.23  . Colon polyps Father   . Diabetes Father        borderline  . Prostate cancer Father   . High blood pressure Father   . Hypertension Mother   . Hypertension Other   . Breast cancer Sister 19       treated with neoadjuvant chemo/radiation and lumpectomy   ROS: Review of Systems  Constitutional: Positive for malaise/fatigue.  Respiratory: Positive for shortness of breath.   Gastrointestinal: Negative for nausea and vomiting.  Musculoskeletal:       Negative for muscle weakness.   PHYSICAL EXAM: Blood pressure 100/68, pulse 73, temperature  98.5 F (36.9 C), temperature source Oral, height '5\' 1"'$  (1.549 m), weight 204 lb (  92.5 kg), SpO2 99 %. Body mass index is 38.55 kg/m. Physical Exam Vitals signs reviewed.  Constitutional:      Appearance: Normal appearance. She is obese.  Cardiovascular:     Rate and Rhythm: Normal rate.     Pulses: Normal pulses.  Pulmonary:     Effort: Pulmonary effort is normal.     Breath sounds: Normal breath sounds.  Musculoskeletal: Normal range of motion.  Skin:    General: Skin is warm and dry.  Neurological:     Mental Status: She is alert and oriented to person, place, and time.  Psychiatric:        Behavior: Behavior normal.   RECENT LABS AND TESTS: BMET    Component Value Date/Time   NA 140 12/17/2018 1539   NA 138 04/19/2017 1005   K 4.0 12/17/2018 1539   K 4.0 04/19/2017 1005   CL 102 12/17/2018 1539   CO2 20 12/17/2018 1539   CO2 25 04/19/2017 1005   GLUCOSE 65 12/17/2018 1539   GLUCOSE 80 11/26/2017 1544   GLUCOSE 87 04/19/2017 1005   BUN 14 12/17/2018 1539   BUN 12.0 04/19/2017 1005   CREATININE 0.68 12/17/2018 1539   CREATININE 0.77 11/26/2017 1544   CREATININE 0.7 04/19/2017 1005   CALCIUM 9.1 12/17/2018 1539   CALCIUM 8.9 04/19/2017 1005   GFRNONAA 98 12/17/2018 1539   GFRNONAA >60 11/26/2017 1544   GFRAA 113 12/17/2018 1539   GFRAA >60 11/26/2017 1544   Lab Results  Component Value Date   HGBA1C 5.6 12/17/2018   HGBA1C 5.7 (H) 06/10/2018   HGBA1C 5.8 (H) 02/18/2018   Lab Results  Component Value Date   INSULIN 4.9 02/18/2018   CBC    Component Value Date/Time   WBC 4.3 11/26/2017 1544   WBC 3.5 (L) 04/19/2017 1005   WBC 7.5 12/14/2016 0120   RBC 3.62 (L) 11/26/2017 1544   HGB 11.5 (L) 11/26/2017 1544   HGB 10.2 (L) 04/19/2017 1005   HCT 34.8 11/26/2017 1544   HCT 31.1 (L) 04/19/2017 1005   PLT 192 11/26/2017 1544   PLT 184 04/19/2017 1005   MCV 96.1 11/26/2017 1544   MCV 97.5 04/19/2017 1005   MCH 31.8 11/26/2017 1544   MCHC 33.0  11/26/2017 1544   RDW 14.5 11/26/2017 1544   RDW 15.3 (H) 04/19/2017 1005   LYMPHSABS 1.3 11/26/2017 1544   LYMPHSABS 1.0 04/19/2017 1005   MONOABS 0.4 11/26/2017 1544   MONOABS 0.3 04/19/2017 1005   EOSABS 0.1 11/26/2017 1544   EOSABS 0.1 04/19/2017 1005   BASOSABS 0.0 11/26/2017 1544   BASOSABS 0.0 04/19/2017 1005   Iron/TIBC/Ferritin/ %Sat No results found for: IRON, TIBC, FERRITIN, IRONPCTSAT Lipid Panel     Component Value Date/Time   CHOL 210 (H) 11/07/2018 0914   TRIG 61 11/07/2018 0914   HDL 84 11/07/2018 0914   CHOLHDL 2.5 11/07/2018 0914   CHOLHDL 2.6 09/30/2015 1838   VLDL 12 09/30/2015 1838   LDLCALC 114 (H) 11/07/2018 0914   Hepatic Function Panel     Component Value Date/Time   PROT 7.5 12/17/2018 1539   PROT 6.7 04/19/2017 1005   ALBUMIN 4.8 12/17/2018 1539   ALBUMIN 3.9 04/19/2017 1005   AST 16 12/17/2018 1539   AST 17 11/26/2017 1544   AST 14 04/19/2017 1005   ALT 14 12/17/2018 1539   ALT 14 11/26/2017 1544   ALT 11 04/19/2017 1005   ALKPHOS 102 12/17/2018 1539   ALKPHOS 50 04/19/2017 1005  BILITOT 0.5 12/17/2018 1539   BILITOT 0.4 11/26/2017 1544   BILITOT 0.31 04/19/2017 1005      Component Value Date/Time   TSH 0.802 02/18/2018 1029   Results for AMINAH, ZABAWA (MRN [id number]) as of 02/18/2019 14:11  Ref. Range 12/17/2018 15:39  Vitamin D, 25-Hydroxy Latest Ref Range: 30.0 - 100.0 ng/mL 17.3 (L)   OBESITY BEHAVIORAL INTERVENTION VISIT  Today's visit was #14  Starting weight: 204 lbs Starting date: 02/18/2018 Today's weight: 204 lbs  Today's date: 02/18/2019 Total lbs lost to date: 0 At least 15 minutes were spent on discussing the following behavioral intervention visit.    02/18/2019  Height 5' 1"  (1.549 m)  Weight 204 lb (92.5 kg)  BMI (Calculated) 38.57  BLOOD PRESSURE - SYSTOLIC 943  BLOOD PRESSURE - DIASTOLIC 68   Body Fat % 20.0 %  Total Body Water (lbs) 78.4 lbs   ASK: We discussed the diagnosis of obesity with  [name] today and Aisa agreed to give [name] permission to discuss obesity behavioral modification therapy today.  ASSESS: Fujiko has the diagnosis of obesity and her BMI today is 38.6. Sagrario is in the action stage of change.   ADVISE: Damira was educated on the multiple health risks of obesity as well as the benefit of weight loss to improve her health. She was advised of the need for long term treatment and the importance of lifestyle modifications to improve her current health and to decrease her risk of future health problems.  AGREE: Multiple dietary modification options and treatment options were discussed and  [place] agreed to follow the recommendations documented in the above note.  ARRANGE: Chanell was educated on the importance of frequent visits to treat obesity as outlined per CMS and USPSTF guidelines and agreed to schedule her next follow up appointment today.  Migdalia Dk, am acting as Location manager for [employer], DO  I have reviewed the above documentation for accuracy and completeness, and I agree with the above. -Jearld Lesch, DO

## 2019-02-20 MED FILL — LINZESS 290 MCG CAPSULE: 290 | 30 days supply | Qty: 30 | Fill #0

## 2019-02-24 DIAGNOSIS — M62838 Other muscle spasm: Secondary | ICD-10-CM | POA: Diagnosis not present

## 2019-02-24 DIAGNOSIS — R109 Unspecified abdominal pain: Secondary | ICD-10-CM | POA: Diagnosis not present

## 2019-02-24 DIAGNOSIS — K5900 Constipation, unspecified: Secondary | ICD-10-CM | POA: Diagnosis not present

## 2019-02-24 DIAGNOSIS — R159 Full incontinence of feces: Secondary | ICD-10-CM | POA: Diagnosis not present

## 2019-02-25 DIAGNOSIS — M47816 Spondylosis without myelopathy or radiculopathy, lumbar region: Secondary | ICD-10-CM | POA: Diagnosis not present

## 2019-03-03 DIAGNOSIS — M6281 Muscle weakness (generalized): Secondary | ICD-10-CM | POA: Diagnosis not present

## 2019-03-03 DIAGNOSIS — K5900 Constipation, unspecified: Secondary | ICD-10-CM | POA: Diagnosis not present

## 2019-03-03 DIAGNOSIS — M62838 Other muscle spasm: Secondary | ICD-10-CM | POA: Diagnosis not present

## 2019-03-03 DIAGNOSIS — M1990 Unspecified osteoarthritis, unspecified site: Secondary | ICD-10-CM | POA: Diagnosis not present

## 2019-03-04 ENCOUNTER — Other Ambulatory Visit: Payer: Self-pay

## 2019-03-04 ENCOUNTER — Encounter (INDEPENDENT_AMBULATORY_CARE_PROVIDER_SITE_OTHER): Payer: Self-pay | Admitting: Bariatrics

## 2019-03-04 ENCOUNTER — Ambulatory Visit (INDEPENDENT_AMBULATORY_CARE_PROVIDER_SITE_OTHER): Payer: Medicare Other | Admitting: Bariatrics

## 2019-03-04 VITALS — BP 141/83 | HR 60 | Temp 98.8°F | Ht 61.0 in | Wt 199.0 lb

## 2019-03-04 DIAGNOSIS — M75111 Incomplete rotator cuff tear or rupture of right shoulder, not specified as traumatic: Secondary | ICD-10-CM | POA: Diagnosis not present

## 2019-03-04 DIAGNOSIS — Z6837 Body mass index (BMI) 37.0-37.9, adult: Secondary | ICD-10-CM

## 2019-03-04 DIAGNOSIS — E559 Vitamin D deficiency, unspecified: Secondary | ICD-10-CM | POA: Diagnosis not present

## 2019-03-04 DIAGNOSIS — I10 Essential (primary) hypertension: Secondary | ICD-10-CM | POA: Diagnosis not present

## 2019-03-04 DIAGNOSIS — E7800 Pure hypercholesterolemia, unspecified: Secondary | ICD-10-CM | POA: Diagnosis not present

## 2019-03-05 ENCOUNTER — Ambulatory Visit (INDEPENDENT_AMBULATORY_CARE_PROVIDER_SITE_OTHER): Payer: Medicare Other | Admitting: Bariatrics

## 2019-03-09 ENCOUNTER — Other Ambulatory Visit (HOSPITAL_COMMUNITY)
Admission: RE | Admit: 2019-03-09 | Discharge: 2019-03-09 | Disposition: A | Discharge status: Home / Self Care | Payer: Medicare Other | Source: Ambulatory Visit | Attending: Family Medicine | Admitting: Family Medicine

## 2019-03-09 ENCOUNTER — Encounter (INDEPENDENT_AMBULATORY_CARE_PROVIDER_SITE_OTHER): Payer: Self-pay | Admitting: Bariatrics

## 2019-03-09 DIAGNOSIS — Z0000 Encounter for general adult medical examination without abnormal findings: Secondary | ICD-10-CM | POA: Diagnosis not present

## 2019-03-09 DIAGNOSIS — Z124 Encounter for screening for malignant neoplasm of cervix: Secondary | ICD-10-CM | POA: Diagnosis not present

## 2019-03-09 DIAGNOSIS — Z1389 Encounter for screening for other disorder: Secondary | ICD-10-CM | POA: Diagnosis not present

## 2019-03-09 NOTE — Progress Notes (Signed)
Office: [phone]  /  Fax: [phone]   HPI:   Chief Complaint: OBESITY [name] is here to discuss her progress with her obesity treatment plan. She is on a lower carbohydrate, vegetable and lean protein rich diet plan and is following her eating plan approximately 100% of the time. She states she is walking 45-60 minutes 5 times per week. [name] is down 5 lbs and doing well overall. She did okay on the low carb diet. She reports doing well with her protein and water goals. Her weight is 199 lb (90.3 kg) today and has had a weight loss of 5 pounds over a period of 2 weeks since her last visit. She has lost 5 lbs since starting treatment with [name].  Hypertension [name] is a 58 y.o. female with hypertension.  [name] denies lightheadedness. She is working weight loss to help control her blood pressure with the goal of decreasing her risk of heart attack and stroke. [name]'s blood pressure is 141/83.   Vitamin D deficiency [name] has a diagnosis of Vitamin D deficiency. Last Vitamin D 17.3 on 12/17/2018. She is currently taking Vit D and denies nausea, vomiting or muscle weakness.  ASSESSMENT AND PLAN:  Essential hypertension  Vitamin D deficiency  Class 2 severe obesity due to excess calories with serious comorbidity and body mass index (BMI) of 37.0 to 37.9 in adult [facility])  PLAN:  Hypertension We discussed sodium restriction, working on healthy weight loss, and a regular exercise program as the means to achieve improved blood pressure control. [name] agreed with this plan and agreed to follow up as directed. We will continue to monitor her blood pressure as well as her progress with the above lifestyle modifications. She will continue her medications as prescribed and will watch for signs of hypotension as she continues her lifestyle modifications.  Vitamin D Deficiency [name] was informed that low Vitamin D levels contributes to fatigue and are  associated with obesity, breast, and colon cancer. She agrees to continue taking Vit D and will follow-up for routine testing of Vitamin D, at least 2-3 times per year. She was informed of the risk of over-replacement of Vitamin D and agrees to not increase her dose unless she discusses this with [name] first. [name] agrees to follow-up with our clinic in 2 weeks.  Obesity [name] is currently in the action stage of change. As such, her goal is to continue with weight loss efforts. She has agreed to follow a lower carbohydrate, vegetable and lean protein rich diet plan. [name] will work on meal planning and intentional eating. [name] has been instructed to continue walking for weight loss and overall health benefits. We discussed the following Behavioral Modification Strategies today: increasing lean protein intake, decreasing simple carbohydrates, increasing vegetables, increase H20 intake, decrease eating out, no skipping meals, work on meal planning and easy cooking plans, keeping healthy foods in the home, and planning for success.  [name] has agreed to follow-up with our clinic in 2 weeks. She was informed of the importance of frequent follow-up visits to maximize her success with intensive lifestyle modifications for her multiple health conditions.  ALLERGIES: Allergies  Allergen Reactions  . Aspirin Other (See Comments)    ESOPHAGITIS  . Oxycodone Hcl Diarrhea and Nausea And Vomiting  . Propoxyphene N-Acetaminophen Diarrhea and Nausea And Vomiting  . Tramadol Other (See Comments)    Cause migraines  . Adhesive [Tape] Rash and Other (See Comments)    Pulls skin off - please  use paper tape  . Prednisone Rash    MEDICATIONS: Current Outpatient Medications on File Prior to Visit  Medication Sig Dispense Refill  . albuterol (PROAIR HFA) 108 (90 Base) MCG/ACT inhaler Inhale 2 puffs into the lungs every 6 (six) hours as needed for wheezing or shortness of breath.    Marland Kitchen aspirin EC 81  MG tablet Take 81 mg by mouth daily.    . Calcium Citrate-Vitamin D (CALCIUM CITRATE +D PO) Take 2 tablets by mouth daily. Calcium 600 mg     . Cholecalciferol (VITAMIN D3) 1.25 MG (50000 UT) CAPS Take 1 capsule by mouth every 7 (seven) days. 4 capsule 0  . hydrOXYzine (ATARAX/VISTARIL) 25 MG tablet Take 25 mg by mouth 3 (three) times daily as needed.    Marland Kitchen letrozole (FEMARA) 2.5 MG tablet TAKE 1 TABLET (2.5 MG TOTAL) BY MOUTH DAILY. 90 tablet 1  . linaclotide (LINZESS) 290 MCG CAPS capsule Take 290 mcg by mouth daily before breakfast.    . Methocarbamol (ROBAXIN-750 PO) Robaxin    . mometasone (ELOCON) 0.1 % cream Apply 1 application topically daily as needed (rash - summer eczema). Apply to arms    . Multiple Vitamin (MULTIVITAMIN WITH MINERALS) TABS tablet Take 1 tablet by mouth at bedtime.    . silver sulfADIAZINE (SILVADENE) 1 % cream Apply 1 application topically 2 (two) times daily as needed (stomach tears). 50 g 0  . solifenacin (VESICARE) 10 MG tablet     . valACYclovir (VALTREX) 1000 MG tablet Take 1 tablet (1,000 mg total) by mouth 3 (three) times daily. 21 tablet 0   No current facility-administered medications on file prior to visit.     PAST MEDICAL HISTORY: Past Medical History:  Diagnosis Date  . Anemia    since bypass  . Arthritis    osteoarthritis  . Asthma    states no asthma attack since 2002  . Back pain   . Breast cancer ([place]) 06/26/16 bx   left breast  . Breast cancer ([place])   . Chronic back pain   . Complication of anesthesia    states takes more than normal to put her to sleep  . Constipation   . Dental bridge present    upper  . Dental crowns present   . DVT of upper extremity (deep vein thrombosis) ([place])   . Fibromyalgia   . Genetic testing 09/19/2016   Ms. Sugg underwent genetic counseling and testing for hereditary cancer syndromes on 08/21/2016. Her results were negative for mutations in all 46 genes analyzed by [facility]'s 46-gene Common Hereditary  Cancers Panel. Genes analyzed include: APC, ATM, AXIN2, BARD1, BMPR1A, BRCA1, BRCA2, BRIP1, CDH1, CDKN2A, CHEK2, CTNNA1, DICER1, EPCAM, GREM1, HOXB13, KIT, MEN1, MLH1, MSH2, MSH3, MSH6, MUTYH, NBN,   . Headache(784.0)    migraines  . History of blood transfusion 06/2005  . History of gallstones   . History of pneumonia   . History of shingles 07/2011  . History of shingles   . HTN (hypertension)   . Itching   . Joint pain   . Muscle weakness   . Neuropathy   . Normal coronary arteries 2003  . Osteoarthropathy   . Personal history of chemotherapy 2018  . Personal history of radiation therapy 2018  . PFO (patent foramen ovale)   . Presence of inferior vena cava filter   . Pulmonary embolism ([place])   . Sleep apnea    used CPAP until after bypass surg.  . Status post gastric bypass for  obesity   . Stomach pain   . Stroke [facility]) 12/2002   right-sided weakness  . Urinary incontinence     PAST SURGICAL HISTORY: Past Surgical History:  Procedure Laterality Date  . ABDOMINAL HYSTERECTOMY  11/1997   complete  . ANAL RECTAL MANOMETRY N/A 01/21/2019   Procedure: ANO RECTAL MANOMETRY;  Surgeon: Arta Silence, MD;  Location: [facility];  Service: Endoscopy;  Laterality: N/A;  . ANTERIOR CERVICAL DECOMP/DISCECTOMY FUSION  02/05/2005   C5-6  . APPENDECTOMY  10/22/2008   laparoscopic  . BREAST LUMPECTOMY Left 07/2016  . BREAST LUMPECTOMY WITH RADIOACTIVE SEED AND SENTINEL LYMPH NODE BIOPSY Left 07/26/2016   Procedure: BREAST LUMPECTOMY WITH RADIOACTIVE SEED AND SENTINEL LYMPH NODE BIOPSY;  Surgeon: Erroll Luna, MD;  Location: [place];  Service: General;  Laterality: Left;  . BUNIONECTOMY  05/1980   both feet  . BUNIONECTOMY  08/2011   left foot  . CARDIAC CATHETERIZATION  03/04/2002  . CARPAL TUNNEL RELEASE  06/21/2009   right  . CARPAL TUNNEL RELEASE     left hand  . CARPAL TUNNEL RELEASE  10/03/2011   Procedure: CARPAL TUNNEL RELEASE;  Surgeon: Wynonia Sours, MD;   Location: [place];  Service: Orthopedics;  Laterality: Right;  CARPAL TUNNEL WITH HYPOTHENAR FAT PAD TRANSFER  . CERVICAL SPINE SURGERY  01/2005   titanium plate implanted  . CHOLECYSTECTOMY  1990  . ELBOW SURGERY  08/09/2004   decompression ulnar nerve right elbow  . ENTEROLYSIS  10/22/2008   laparoscopic abd. enterolysis  . GASTRIC ROUX-EN-Y  2009  . HEEL SPUR SURGERY  08/1997   left  . HEMORRHOID SURGERY  03/1993  . LAPAROSCOPIC LYSIS INTESTINAL ADHESIONS  02/14/2000  . NAILBED REPAIR  01/10/2005; 08/2011   exc. matrix bilat. great toe  . OTHER SURGICAL HISTORY  12/1986   pt states that she had surgery to unclog her fallopean tubes  . PORTACATH PLACEMENT N/A 09/24/2016   Procedure: INSERTION PORT-A-CATH WITH [name];  Surgeon: Erroll Luna, MD;  Location: [place];  Service: General;  Laterality: N/A;  . SHOULDER SURGERY     bilat. - (left:  06/2005)  . TONSILLECTOMY  07/1995  . TRIGGER FINGER RELEASE  04/25/2006   decompression A-1 pulley left thumb  . TRIGGER FINGER RELEASE Right 11/11/2018   Procedure: RELEASE TRIGGER FINGER/A-1 PULLEY RIGHT THUMB, RIGHT RING FINGER;  Surgeon: Daryll Brod, MD;  Location: [place];  Service: Orthopedics;  Laterality: Right;  . UTERINE FIBROID SURGERY  12/95, 7/96   x2  . VENA CAVA FILTER PLACEMENT  2009   during Roux-en-Y surg.    SOCIAL HISTORY: Social History   Tobacco Use  . Smoking status: Former Research scientist (life sciences)  . Smokeless tobacco: Never Used  . Tobacco comment: quit smoking 08/1989  Substance Use Topics  . Alcohol use: No  . Drug use: No    FAMILY HISTORY: Family History  Problem Relation Age of Onset  . Breast cancer Paternal Aunt 78  . Cervical cancer Paternal Grandmother 56       d.40s  . Ovarian cancer Maternal Grandmother 23       d.23  . Colon polyps Father   . Diabetes Father        borderline  . Prostate cancer Father   . High blood pressure Father   . Hypertension Mother   . Hypertension Other   .  Breast cancer Sister 76       treated with neoadjuvant chemo/radiation and  lumpectomy   ROS: Review of Systems  Gastrointestinal: Negative for nausea and vomiting.  Musculoskeletal:       Negative for muscle weakness.  Neurological:       Negative for lightheadedness.   PHYSICAL EXAM: Blood pressure (!) 141/83, pulse 60, temperature 98.8 F (37.1 C), temperature source Oral, height _0  (1.549 m), weight 199 lb (90.3 kg), SpO2 99 %. Body mass index is 37.6 kg/m. Physical Exam Vitals signs reviewed.  Constitutional:      Appearance: Normal appearance. She is obese.  Cardiovascular:     Rate and Rhythm: Normal rate.     Pulses: Normal pulses.  Pulmonary:     Effort: Pulmonary effort is normal.     Breath sounds: Normal breath sounds.  Musculoskeletal: Normal range of motion.  Skin:    General: Skin is warm and dry.  Neurological:     Mental Status: She is alert and oriented to person, place, and time.  Psychiatric:        Behavior: Behavior normal.   RECENT LABS AND TESTS: BMET    Component Value Date/Time   NA 140 12/17/2018 1539   NA 138 04/19/2017 1005   K 4.0 12/17/2018 1539   K 4.0 04/19/2017 1005   CL 102 12/17/2018 1539   CO2 20 12/17/2018 1539   CO2 25 04/19/2017 1005   GLUCOSE 65 12/17/2018 1539   GLUCOSE 80 11/26/2017 1544   GLUCOSE 87 04/19/2017 1005   BUN 14 12/17/2018 1539   BUN 12.0 04/19/2017 1005   CREATININE 0.68 12/17/2018 1539   CREATININE 0.77 11/26/2017 1544   CREATININE 0.7 04/19/2017 1005   CALCIUM 9.1 12/17/2018 1539   CALCIUM 8.9 04/19/2017 1005   GFRNONAA 98 12/17/2018 1539   GFRNONAA >60 11/26/2017 1544   GFRAA 113 12/17/2018 1539   GFRAA >60 11/26/2017 1544   Lab Results  Component Value Date   HGBA1C 5.6 12/17/2018   HGBA1C 5.7 (H) 06/10/2018   HGBA1C 5.8 (H) 02/18/2018   Lab Results  Component Value Date   INSULIN 4.9 02/18/2018   CBC    Component Value Date/Time   WBC 4.3 11/26/2017 1544   WBC 3.5 (L) 04/19/2017  1005   WBC 7.5 12/14/2016 0120   RBC 3.62 (L) 11/26/2017 1544   HGB 11.5 (L) 11/26/2017 1544   HGB 10.2 (L) 04/19/2017 1005   HCT 34.8 11/26/2017 1544   HCT 31.1 (L) 04/19/2017 1005   PLT 192 11/26/2017 1544   PLT 184 04/19/2017 1005   MCV 96.1 11/26/2017 1544   MCV 97.5 04/19/2017 1005   MCH 31.8 11/26/2017 1544   MCHC 33.0 11/26/2017 1544   RDW 14.5 11/26/2017 1544   RDW 15.3 (H) 04/19/2017 1005   LYMPHSABS 1.3 11/26/2017 1544   LYMPHSABS 1.0 04/19/2017 1005   MONOABS 0.4 11/26/2017 1544   MONOABS 0.3 04/19/2017 1005   EOSABS 0.1 11/26/2017 1544   EOSABS 0.1 04/19/2017 1005   BASOSABS 0.0 11/26/2017 1544   BASOSABS 0.0 04/19/2017 1005   Iron/TIBC/Ferritin/ %Sat No results found for: IRON, TIBC, FERRITIN, IRONPCTSAT Lipid Panel     Component Value Date/Time   CHOL 210 (H) 11/07/2018 0914   TRIG 61 11/07/2018 0914   HDL 84 11/07/2018 0914   CHOLHDL 2.5 11/07/2018 0914   CHOLHDL 2.6 09/30/2015 1838   VLDL 12 09/30/2015 1838   LDLCALC 114 (H) 11/07/2018 0914   Hepatic Function Panel     Component Value Date/Time   PROT 7.5 12/17/2018 1539   PROT  6.7 04/19/2017 1005   ALBUMIN 4.8 12/17/2018 1539   ALBUMIN 3.9 04/19/2017 1005   AST 16 12/17/2018 1539   AST 17 11/26/2017 1544   AST 14 04/19/2017 1005   ALT 14 12/17/2018 1539   ALT 14 11/26/2017 1544   ALT 11 04/19/2017 1005   ALKPHOS 102 12/17/2018 1539   ALKPHOS 50 04/19/2017 1005   BILITOT 0.5 12/17/2018 1539   BILITOT 0.4 11/26/2017 1544   BILITOT 0.31 04/19/2017 1005      Component Value Date/Time   TSH 0.802 02/18/2018 1029   Results for ELIDE, STALZER (MRN [id number]) as of 03/09/2019 14:43  Ref. Range 12/17/2018 15:39  Vitamin D, 25-Hydroxy Latest Ref Range: 30.0 - 100.0 ng/mL 17.3 (L)   OBESITY BEHAVIORAL INTERVENTION VISIT  Today's visit was #15  Starting weight: 204 lbs Starting date: 02/18/2018 Today's weight: 199 lbs  Today's date: 03/04/2019 Total lbs lost to date: 5 At least 15  minutes were spent on discussing the following behavioral intervention visit.    03/04/2019  Height _0  (1.549 m)  Weight 199 lb (90.3 kg)  BMI (Calculated) 37.62  BLOOD PRESSURE - SYSTOLIC 300  BLOOD PRESSURE - DIASTOLIC 83   Body Fat % 52 %   ASK: We discussed the diagnosis of obesity with [name] today and Chance agreed to give [name] permission to discuss obesity behavioral modification therapy today.  ASSESS: Serina has the diagnosis of obesity and her BMI today is 37.8. Melitza is in the action stage of change.   ADVISE: Larue was educated on the multiple health risks of obesity as well as the benefit of weight loss to improve her health. She was advised of the need for long term treatment and the importance of lifestyle modifications to improve her current health and to decrease her risk of future health problems.  AGREE: Multiple dietary modification options and treatment options were discussed and  Dezaree agreed to follow the recommendations documented in the above note.  ARRANGE: Yehudit was educated on the importance of frequent visits to treat obesity as outlined per CMS and USPSTF guidelines and agreed to schedule her next follow up appointment today.  Migdalia Dk, am acting as Location manager for [employer], DO  I have reviewed the above documentation for accuracy and completeness, and I agree with the above. -Jearld Lesch, DO

## 2019-03-10 ENCOUNTER — Ambulatory Visit
Admission: RE | Admit: 2019-03-10 | Discharge: 2019-03-10 | Disposition: A | Discharge status: Home / Self Care | Payer: Medicare Other | Source: Ambulatory Visit | Attending: Adult Health | Admitting: Adult Health

## 2019-03-10 ENCOUNTER — Telehealth: Payer: Self-pay | Admitting: Adult Health

## 2019-03-10 ENCOUNTER — Other Ambulatory Visit: Payer: Self-pay

## 2019-03-10 DIAGNOSIS — M1990 Unspecified osteoarthritis, unspecified site: Secondary | ICD-10-CM | POA: Diagnosis not present

## 2019-03-10 DIAGNOSIS — Z1231 Encounter for screening mammogram for malignant neoplasm of breast: Secondary | ICD-10-CM

## 2019-03-10 DIAGNOSIS — R109 Unspecified abdominal pain: Secondary | ICD-10-CM | POA: Diagnosis not present

## 2019-03-10 DIAGNOSIS — M6281 Muscle weakness (generalized): Secondary | ICD-10-CM | POA: Diagnosis not present

## 2019-03-10 DIAGNOSIS — R922 Inconclusive mammogram: Secondary | ICD-10-CM | POA: Diagnosis not present

## 2019-03-10 DIAGNOSIS — Z853 Personal history of malignant neoplasm of breast: Secondary | ICD-10-CM

## 2019-03-10 DIAGNOSIS — K5900 Constipation, unspecified: Secondary | ICD-10-CM | POA: Diagnosis not present

## 2019-03-10 NOTE — Telephone Encounter (Signed)
Called and LMOM for her to call me back about her mammogram.  She has breast density category D.  I recommend MRI in 6 months.  I will place orders today, and will review with patient when she calls [name] back.  Wilber Bihari, NP

## 2019-03-11 MED FILL — SOLIFENACIN SUCCINATE 10 MG: 10 | 30 days supply | Qty: 30 | Fill #5

## 2019-03-12 ENCOUNTER — Encounter: Payer: Self-pay | Admitting: Adult Health

## 2019-03-13 ENCOUNTER — Encounter: Payer: Self-pay | Admitting: Hematology and Oncology

## 2019-03-13 ENCOUNTER — Ambulatory Visit: Payer: Medicare Other | Admitting: Cardiovascular Disease

## 2019-03-23 DIAGNOSIS — R109 Unspecified abdominal pain: Secondary | ICD-10-CM | POA: Diagnosis not present

## 2019-03-23 DIAGNOSIS — K5900 Constipation, unspecified: Secondary | ICD-10-CM | POA: Diagnosis not present

## 2019-03-23 DIAGNOSIS — R159 Full incontinence of feces: Secondary | ICD-10-CM | POA: Diagnosis not present

## 2019-03-23 DIAGNOSIS — M6281 Muscle weakness (generalized): Secondary | ICD-10-CM | POA: Diagnosis not present

## 2019-03-24 ENCOUNTER — Encounter (HOSPITAL_COMMUNITY): Payer: Self-pay

## 2019-03-24 DIAGNOSIS — M545 Low back pain: Secondary | ICD-10-CM | POA: Diagnosis not present

## 2019-03-24 DIAGNOSIS — M47816 Spondylosis without myelopathy or radiculopathy, lumbar region: Secondary | ICD-10-CM | POA: Diagnosis not present

## 2019-03-24 MED FILL — LINZESS 290 MCG CAPSULE: 290 | 30 days supply | Qty: 30 | Fill #1

## 2019-03-25 ENCOUNTER — Ambulatory Visit (INDEPENDENT_AMBULATORY_CARE_PROVIDER_SITE_OTHER): Payer: Medicare Other | Admitting: Bariatrics

## 2019-03-25 ENCOUNTER — Other Ambulatory Visit: Payer: Self-pay

## 2019-03-25 VITALS — BP 104/67 | HR 56 | Temp 98.7°F | Ht 61.0 in | Wt 199.0 lb

## 2019-03-25 DIAGNOSIS — Z6837 Body mass index (BMI) 37.0-37.9, adult: Secondary | ICD-10-CM

## 2019-03-25 DIAGNOSIS — I10 Essential (primary) hypertension: Secondary | ICD-10-CM | POA: Diagnosis not present

## 2019-03-25 DIAGNOSIS — Z9189 Other specified personal risk factors, not elsewhere classified: Secondary | ICD-10-CM | POA: Diagnosis not present

## 2019-03-25 DIAGNOSIS — E559 Vitamin D deficiency, unspecified: Secondary | ICD-10-CM | POA: Diagnosis not present

## 2019-03-25 MED ORDER — VITAMIN D3 1.25 MG (50000 UT) PO CAPS: 1.0000 | ORAL_CAPSULE | ORAL | 0 refills | Status: DC

## 2019-03-25 MED FILL — AMOXICILLIN 500 MG CAPSULE: 500 | 10 days supply | Qty: 30 | Fill #0

## 2019-03-25 MED FILL — VIT D3-50 50,000 UNITS CAPS: 1.25 MG | 28 days supply | Qty: 2 | Fill #0

## 2019-03-25 NOTE — Progress Notes (Signed)
Office: [phone]  /  Fax: [phone]   HPI:   Chief Complaint: OBESITY [name] is here to discuss her progress with her obesity treatment plan. She is on the lower carbohydrate, vegetable and lean protein rich diet plan and is following her eating plan approximately 99 % of the time. She states she is walking for 45-60 minutes 4 times per week. [name]'s weight has remained the same. She states that her energy has decreased. She is going to have her right shoulder examined next week. She is doing 1 gallon of water daily.  Her weight is 199 lb (90.3 kg) today and has not lost weight since her last visit. She has lost 5 lbs since starting treatment with [name].  Vitamin D Deficiency [name] has a diagnosis of vitamin D deficiency. She is currently taking prescription Vit D. Last Vit D level was 17.3, but it is now 50 at another office. She denies nausea, vomiting or muscle weakness.  At risk for osteopenia and osteoporosis [name] is at higher risk of osteopenia and osteoporosis due to vitamin D deficiency.   Hypertension [name] is a 58 y.o. female with hypertension. [name]'s blood pressure is well controlled. She is not on medications and denies chest pain. She is working on weight loss to help control her blood pressure with the goal of decreasing her risk of heart attack and stroke.   ASSESSMENT AND PLAN:  Vitamin D deficiency - Plan: Cholecalciferol (VITAMIN D3) 1.25 MG (50000 UT) CAPS  Essential hypertension  At risk for osteoporosis  Class 2 severe obesity due to excess calories with serious comorbidity and body mass index (BMI) of 37.0 to 37.9 in adult [name])  PLAN:  Vitamin D Deficiency [name] was informed that low vitamin D levels contributes to fatigue and are associated with obesity, breast, and colon cancer. [name] agrees to continue taking prescription Vit D 50,000 IU every 14 days #2 with no refills. She will follow up for routine testing of  vitamin D, at least 2-3 times per year. She was informed of the risk of over-replacement of vitamin D and agrees to not increase her dose unless she discusses this with [name] first. [name] agrees to follow up with our clinic in 4 weeks.  At risk for osteopenia and osteoporosis [name] was given extended (15 minutes) osteoporosis prevention counseling today. [name] is at risk for osteopenia and osteoporsis due to her vitamin D deficiency. She was encouraged to take her vitamin D and follow her higher calcium diet and increase strengthening exercise to help strengthen her bones and decrease her risk of osteopenia and osteoporosis.  Hypertension We discussed sodium restriction, working on healthy weight loss, and a regular exercise program as the means to achieve improved blood pressure control. [name] agreed with this plan and agreed to follow up as directed. We will continue to monitor her blood pressure as well as her progress with the above lifestyle modifications. She will watch for signs of hypotension as she continues her lifestyle modifications. [name] agrees to follow up with our clinic in 4 weeks.  Obesity [name] is currently in the action stage of change. As such, her goal is to continue with weight loss efforts She has agreed to follow the Category 2 plan [name] has been instructed to work up to a goal of 150 minutes of combined cardio and strengthening exercise per week for weight loss and overall health benefits. We discussed the following Behavioral Modification Strategies today: increasing lean protein intake, decreasing simple carbohydrates,  increasing vegetables, decrease eating out, increase H20 intake, no skipping meals, work on meal planning and easy cooking plans, and keeping healthy foods in the home   [name] has agreed to follow up with our clinic in 4 weeks. She was informed of the importance of frequent follow up visits to maximize her success with intensive  lifestyle modifications for her multiple health conditions.  ALLERGIES: Allergies  Allergen Reactions   Aspirin Other (See Comments)    ESOPHAGITIS   Oxycodone Hcl Diarrhea and Nausea And Vomiting   Propoxyphene N-Acetaminophen Diarrhea and Nausea And Vomiting   Tramadol Other (See Comments)    Cause migraines   Adhesive [Tape] Rash and Other (See Comments)    Pulls skin off - please use paper tape   Prednisone Rash    MEDICATIONS: Current Outpatient Medications on File Prior to Visit  Medication Sig Dispense Refill   albuterol (PROAIR HFA) 108 (90 Base) MCG/ACT inhaler Inhale 2 puffs into the lungs every 6 (six) hours as needed for wheezing or shortness of breath.     aspirin EC 81 MG tablet Take 81 mg by mouth daily.     Calcium Citrate-Vitamin D (CALCIUM CITRATE +D PO) Take 2 tablets by mouth daily. Calcium 600 mg      hydrOXYzine (ATARAX/VISTARIL) 25 MG tablet Take 25 mg by mouth 3 (three) times daily as needed.     letrozole (FEMARA) 2.5 MG tablet TAKE 1 TABLET (2.5 MG TOTAL) BY MOUTH DAILY. 90 tablet 1   linaclotide (LINZESS) 290 MCG CAPS capsule Take 290 mcg by mouth daily before breakfast.     Methocarbamol (ROBAXIN-750 PO) Robaxin     mometasone (ELOCON) 0.1 % cream Apply 1 application topically daily as needed (rash - summer eczema). Apply to arms     Multiple Vitamin (MULTIVITAMIN WITH MINERALS) TABS tablet Take 1 tablet by mouth at bedtime.     silver sulfADIAZINE (SILVADENE) 1 % cream Apply 1 application topically 2 (two) times daily as needed (stomach tears). 50 g 0   solifenacin (VESICARE) 10 MG tablet      valACYclovir (VALTREX) 1000 MG tablet Take 1 tablet (1,000 mg total) by mouth 3 (three) times daily. 21 tablet 0   No current facility-administered medications on file prior to visit.     PAST MEDICAL HISTORY: Past Medical History:  Diagnosis Date   Anemia    since bypass   Arthritis    osteoarthritis   Asthma    states no asthma  attack since 2002   Back pain    Breast cancer ([place]) 06/26/16 bx   left breast   Breast cancer ([place])    Chronic back pain    Complication of anesthesia    states takes more than normal to put her to sleep   Constipation    Dental bridge present    upper   Dental crowns present    DVT of upper extremity (deep vein thrombosis) ([place])    Fibromyalgia    Genetic testing 09/19/2016   Ms. Gottlieb underwent genetic counseling and testing for hereditary cancer syndromes on 08/21/2016. Her results were negative for mutations in all 46 genes analyzed by [facility]'s 46-gene Common Hereditary Cancers Panel. Genes analyzed include: APC, ATM, AXIN2, BARD1, BMPR1A, BRCA1, BRCA2, BRIP1, CDH1, CDKN2A, CHEK2, CTNNA1, DICER1, EPCAM, GREM1, HOXB13, KIT, MEN1, MLH1, MSH2, MSH3, MSH6, MUTYH, NBN,    Headache(784.0)    migraines   History of blood transfusion 06/2005   History of gallstones    History of  pneumonia    History of shingles 07/2011   History of shingles    HTN (hypertension)    Itching    Joint pain    Muscle weakness    Neuropathy    Normal coronary arteries 2003   Osteoarthropathy    Personal history of chemotherapy 2018   Personal history of radiation therapy 2018   PFO (patent foramen ovale)    Presence of inferior vena cava filter    Pulmonary embolism (HCC)    Sleep apnea    used CPAP until after bypass surg.   Status post gastric bypass for obesity    Stomach pain    Stroke [facility]) 12/2002   right-sided weakness   Urinary incontinence     PAST SURGICAL HISTORY: Past Surgical History:  Procedure Laterality Date   ABDOMINAL HYSTERECTOMY  11/1997   complete   ANAL RECTAL MANOMETRY N/A 01/21/2019   Procedure: ANO RECTAL MANOMETRY;  Surgeon: Arta Silence, MD;  Location: [facility];  Service: Endoscopy;  Laterality: N/A;   ANTERIOR CERVICAL DECOMP/DISCECTOMY FUSION  02/05/2005   C5-6   APPENDECTOMY  10/22/2008   laparoscopic   BREAST LUMPECTOMY  Left 07/2016   BREAST LUMPECTOMY WITH RADIOACTIVE SEED AND SENTINEL LYMPH NODE BIOPSY Left 07/26/2016   Procedure: BREAST LUMPECTOMY WITH RADIOACTIVE SEED AND SENTINEL LYMPH NODE BIOPSY;  Surgeon: Erroll Luna, MD;  Location: [facility];  Service: General;  Laterality: Left;   BUNIONECTOMY  05/1980   both feet   BUNIONECTOMY  08/2011   left foot   CARDIAC CATHETERIZATION  03/04/2002   CARPAL TUNNEL RELEASE  06/21/2009   right   CARPAL TUNNEL RELEASE     left hand   CARPAL TUNNEL RELEASE  10/03/2011   Procedure: CARPAL TUNNEL RELEASE;  Surgeon: Wynonia Sours, MD;  Location: [facility];  Service: Orthopedics;  Laterality: Right;  CARPAL TUNNEL WITH HYPOTHENAR FAT PAD TRANSFER   CERVICAL SPINE SURGERY  01/2005   titanium plate implanted   CHOLECYSTECTOMY  1990   ELBOW SURGERY  08/09/2004   decompression ulnar nerve right elbow   ENTEROLYSIS  10/22/2008   laparoscopic abd. enterolysis   GASTRIC ROUX-EN-Y  2009   HEEL SPUR SURGERY  08/1997   left   HEMORRHOID SURGERY  03/1993   LAPAROSCOPIC LYSIS INTESTINAL ADHESIONS  02/14/2000   NAILBED REPAIR  01/10/2005; 08/2011   exc. matrix bilat. great toe   OTHER SURGICAL HISTORY  12/1986   pt states that she had surgery to unclog her fallopean tubes   PORTACATH PLACEMENT N/A 09/24/2016   Procedure: INSERTION PORT-A-CATH WITH [name];  Surgeon: Erroll Luna, MD;  Location: [facility];  Service: General;  Laterality: N/A;   SHOULDER SURGERY     bilat. - (left:  06/2005)   TONSILLECTOMY  07/1995   TRIGGER FINGER RELEASE  04/25/2006   decompression A-1 pulley left thumb   TRIGGER FINGER RELEASE Right 11/11/2018   Procedure: RELEASE TRIGGER FINGER/A-1 PULLEY RIGHT THUMB, RIGHT RING FINGER;  Surgeon: Daryll Brod, MD;  Location: [facility];  Service: Orthopedics;  Laterality: Right;   UTERINE FIBROID SURGERY  12/95, 7/96   x2   VENA CAVA FILTER PLACEMENT  2009   during Roux-en-Y surg.    SOCIAL  HISTORY: Social History   Tobacco Use   Smoking status: Former Smoker   Smokeless tobacco: Never Used   Tobacco comment: quit smoking 08/1989  Substance Use Topics   Alcohol use: No   Drug use: No  FAMILY HISTORY: Family History  Problem Relation Age of Onset   Breast cancer Paternal Aunt 34   Cervical cancer Paternal Grandmother 42       d.37s   Ovarian cancer Maternal Grandmother 23       d.23   Breast cancer Maternal Grandmother    Colon polyps Father    Diabetes Father        borderline   Prostate cancer Father    High blood pressure Father    Hypertension Mother    Breast cancer Mother    Hypertension Other    Breast cancer Sister 71       treated with neoadjuvant chemo/radiation and lumpectomy   Breast cancer Sister     ROS: Review of Systems  Constitutional: Negative for weight loss.  Cardiovascular: Negative for chest pain.  Gastrointestinal: Negative for nausea and vomiting.  Musculoskeletal:       Negative muscle weakness    PHYSICAL EXAM: Blood pressure 104/67, pulse (!) 56, temperature 98.7 F (37.1 C), height 5' 1"  (1.549 m), weight 199 lb (90.3 kg), SpO2 100 %. Body mass index is 37.6 kg/m. Physical Exam Vitals signs reviewed.  Constitutional:      Appearance: Normal appearance. She is obese.  Cardiovascular:     Rate and Rhythm: Normal rate.     Pulses: Normal pulses.  Pulmonary:     Effort: Pulmonary effort is normal.     Breath sounds: Normal breath sounds.  Musculoskeletal: Normal range of motion.  Skin:    General: Skin is warm and dry.  Neurological:     Mental Status: She is alert and oriented to person, place, and time.  Psychiatric:        Mood and Affect: Mood normal.        Behavior: Behavior normal.     RECENT LABS AND TESTS: BMET    Component Value Date/Time   NA 140 12/17/2018 1539   NA 138 04/19/2017 1005   K 4.0 12/17/2018 1539   K 4.0 04/19/2017 1005   CL 102 12/17/2018 1539   CO2 20  12/17/2018 1539   CO2 25 04/19/2017 1005   GLUCOSE 65 12/17/2018 1539   GLUCOSE 80 11/26/2017 1544   GLUCOSE 87 04/19/2017 1005   BUN 14 12/17/2018 1539   BUN 12.0 04/19/2017 1005   CREATININE 0.68 12/17/2018 1539   CREATININE 0.77 11/26/2017 1544   CREATININE 0.7 04/19/2017 1005   CALCIUM 9.1 12/17/2018 1539   CALCIUM 8.9 04/19/2017 1005   GFRNONAA 98 12/17/2018 1539   GFRNONAA >60 11/26/2017 1544   GFRAA 113 12/17/2018 1539   GFRAA >60 11/26/2017 1544   Lab Results  Component Value Date   HGBA1C 5.6 12/17/2018   HGBA1C 5.7 (H) 06/10/2018   HGBA1C 5.8 (H) 02/18/2018   Lab Results  Component Value Date   INSULIN 4.9 02/18/2018   CBC    Component Value Date/Time   WBC 4.3 11/26/2017 1544   WBC 3.5 (L) 04/19/2017 1005   WBC 7.5 12/14/2016 0120   RBC 3.62 (L) 11/26/2017 1544   HGB 11.5 (L) 11/26/2017 1544   HGB 10.2 (L) 04/19/2017 1005   HCT 34.8 11/26/2017 1544   HCT 31.1 (L) 04/19/2017 1005   PLT 192 11/26/2017 1544   PLT 184 04/19/2017 1005   MCV 96.1 11/26/2017 1544   MCV 97.5 04/19/2017 1005   MCH 31.8 11/26/2017 1544   MCHC 33.0 11/26/2017 1544   RDW 14.5 11/26/2017 1544   RDW 15.3 (H) 04/19/2017  1005   LYMPHSABS 1.3 11/26/2017 1544   LYMPHSABS 1.0 04/19/2017 1005   MONOABS 0.4 11/26/2017 1544   MONOABS 0.3 04/19/2017 1005   EOSABS 0.1 11/26/2017 1544   EOSABS 0.1 04/19/2017 1005   BASOSABS 0.0 11/26/2017 1544   BASOSABS 0.0 04/19/2017 1005   Iron/TIBC/Ferritin/ %Sat No results found for: IRON, TIBC, FERRITIN, IRONPCTSAT Lipid Panel     Component Value Date/Time   CHOL 210 (H) 11/07/2018 0914   TRIG 61 11/07/2018 0914   HDL 84 11/07/2018 0914   CHOLHDL 2.5 11/07/2018 0914   CHOLHDL 2.6 09/30/2015 1838   VLDL 12 09/30/2015 1838   LDLCALC 114 (H) 11/07/2018 0914   Hepatic Function Panel     Component Value Date/Time   PROT 7.5 12/17/2018 1539   PROT 6.7 04/19/2017 1005   ALBUMIN 4.8 12/17/2018 1539   ALBUMIN 3.9 04/19/2017 1005   AST 16  12/17/2018 1539   AST 17 11/26/2017 1544   AST 14 04/19/2017 1005   ALT 14 12/17/2018 1539   ALT 14 11/26/2017 1544   ALT 11 04/19/2017 1005   ALKPHOS 102 12/17/2018 1539   ALKPHOS 50 04/19/2017 1005   BILITOT 0.5 12/17/2018 1539   BILITOT 0.4 11/26/2017 1544   BILITOT 0.31 04/19/2017 1005      Component Value Date/Time   TSH 0.802 02/18/2018 1029      OBESITY BEHAVIORAL INTERVENTION VISIT  Today's visit was # 16   Starting weight: 204 lbs Starting date: 02/18/18 Today's weight : 199 lbs Today's date: 03/25/2019 Total lbs lost to date: 5    ASK: We discussed the diagnosis of obesity with [name] today and Kebrina agreed to give [name] permission to discuss obesity behavioral modification therapy today.  ASSESS: Reannon has the diagnosis of obesity and her BMI today is 37.62 Kiasia is in the action stage of change   ADVISE: Berdina was educated on the multiple health risks of obesity as well as the benefit of weight loss to improve her health. She was advised of the need for long term treatment and the importance of lifestyle modifications to improve her current health and to decrease her risk of future health problems.  AGREE: Multiple dietary modification options and treatment options were discussed and  [name] agreed to follow the recommendations documented in the above note.  ARRANGE: [name] was educated on the importance of frequent visits to treat obesity as outlined per CMS and USPSTF guidelines and agreed to schedule her next follow up appointment today.  [name], am acting as transcriptionist for [employer], DO  I have reviewed the above documentation for accuracy and completeness, and I agree with the above. -Jearld Lesch, DO

## 2019-03-26 ENCOUNTER — Other Ambulatory Visit: Payer: Self-pay | Admitting: Rehabilitation

## 2019-03-26 DIAGNOSIS — M47816 Spondylosis without myelopathy or radiculopathy, lumbar region: Secondary | ICD-10-CM

## 2019-03-30 DIAGNOSIS — S46011A Strain of muscle(s) and tendon(s) of the rotator cuff of right shoulder, initial encounter: Secondary | ICD-10-CM | POA: Diagnosis not present

## 2019-03-30 DIAGNOSIS — M94211 Chondromalacia, right shoulder: Secondary | ICD-10-CM | POA: Diagnosis not present

## 2019-03-30 DIAGNOSIS — G8918 Other acute postprocedural pain: Secondary | ICD-10-CM | POA: Diagnosis not present

## 2019-03-30 DIAGNOSIS — M659 Synovitis and tenosynovitis, unspecified: Secondary | ICD-10-CM | POA: Diagnosis not present

## 2019-03-30 DIAGNOSIS — S43431A Superior glenoid labrum lesion of right shoulder, initial encounter: Secondary | ICD-10-CM | POA: Diagnosis not present

## 2019-03-30 DIAGNOSIS — M19011 Primary osteoarthritis, right shoulder: Secondary | ICD-10-CM | POA: Diagnosis not present

## 2019-03-30 MED FILL — HYDROCODON-APAP 5-325: 5-325 | 5 days supply | Qty: 35 | Fill #0

## 2019-03-30 MED FILL — METHOCARBAMOL 500 MG TABLET: 500 | 10 days supply | Qty: 40 | Fill #0

## 2019-04-02 ENCOUNTER — Ambulatory Visit: Payer: Medicare Other | Attending: Orthopedic Surgery | Admitting: Physical Therapy

## 2019-04-02 ENCOUNTER — Encounter: Payer: Self-pay | Admitting: Physical Therapy

## 2019-04-02 ENCOUNTER — Other Ambulatory Visit: Payer: Self-pay

## 2019-04-02 DIAGNOSIS — M25611 Stiffness of right shoulder, not elsewhere classified: Secondary | ICD-10-CM | POA: Diagnosis not present

## 2019-04-02 DIAGNOSIS — R600 Localized edema: Secondary | ICD-10-CM | POA: Diagnosis not present

## 2019-04-02 DIAGNOSIS — M25511 Pain in right shoulder: Secondary | ICD-10-CM | POA: Diagnosis not present

## 2019-04-02 NOTE — Therapy (Signed)
[place] [place], [name], [address] Phone: [phone]   Fax:  [phone]  Physical Therapy Evaluation  Patient Details  Name: [name] MRN: [id number] Date of Birth: [birth year] Referring Provider (PT): [name] Date: 04/02/2019  PT End of Session - 04/02/19 1709    Visit Number  1    Date for PT Re-Evaluation  06/02/19    PT Start Time  1522    PT Stop Time  1615    PT Time Calculation (min)  53 min    Activity Tolerance  Patient tolerated treatment well    Behavior During Therapy  [facility] for tasks assessed/performed;Anxious       Past Medical History:  Diagnosis Date  . Anemia    since bypass  . Arthritis    osteoarthritis  . Asthma    states no asthma attack since 2002  . Back pain   . Breast cancer ([place]) 06/26/16 bx   left breast  . Breast cancer ([place])   . Chronic back pain   . Complication of anesthesia    states takes more than normal to put her to sleep  . Constipation   . Dental bridge present    upper  . Dental crowns present   . DVT of upper extremity (deep vein thrombosis) ([place])   . Fibromyalgia   . Genetic testing 09/19/2016   Ms. Manlove underwent genetic counseling and testing for hereditary cancer syndromes on 08/21/2016. Her results were negative for mutations in all 46 genes analyzed by [employer]'s 46-gene Common Hereditary Cancers Panel. Genes analyzed include: APC, ATM, AXIN2, BARD1, BMPR1A, BRCA1, BRCA2, BRIP1, CDH1, CDKN2A, CHEK2, CTNNA1, DICER1, EPCAM, GREM1, HOXB13, KIT, MEN1, MLH1, MSH2, MSH3, MSH6, MUTYH, NBN,   . Headache(784.0)    migraines  . History of blood transfusion 06/2005  . History of gallstones   . History of pneumonia   . History of shingles 07/2011  . History of shingles   . HTN (hypertension)   . Itching   . Joint pain   . Muscle weakness   . Neuropathy   . Normal coronary arteries 2003  . Osteoarthropathy   . Personal history  of chemotherapy 2018  . Personal history of radiation therapy 2018  . PFO (patent foramen ovale)   . Presence of inferior vena cava filter   . Pulmonary embolism ([place])   . Sleep apnea    used CPAP until after bypass surg.  . Status post gastric bypass for obesity   . Stomach pain   . Stroke [facility]) 12/2002   right-sided weakness  . Urinary incontinence     Past Surgical History:  Procedure Laterality Date  . ABDOMINAL HYSTERECTOMY  11/1997   complete  . ANAL RECTAL MANOMETRY N/A 01/21/2019   Procedure: ANO RECTAL MANOMETRY;  Surgeon: Arta Silence, MD;  [facility];  Service: Endoscopy;  Laterality: N/A;  . ANTERIOR CERVICAL DECOMP/DISCECTOMY FUSION  02/05/2005   C5-6  . APPENDECTOMY  10/22/2008   laparoscopic  . BREAST LUMPECTOMY Left 07/2016  . BREAST LUMPECTOMY WITH RADIOACTIVE SEED AND SENTINEL LYMPH NODE BIOPSY Left 07/26/2016   Procedure: BREAST LUMPECTOMY WITH RADIOACTIVE SEED AND SENTINEL LYMPH NODE BIOPSY;  Surgeon: Erroll Luna, MD;  Location: [place];  Service: General;  Laterality: Left;  . BUNIONECTOMY  05/1980   both feet  . BUNIONECTOMY  08/2011   left foot  . CARDIAC CATHETERIZATION  03/04/2002  . CARPAL  TUNNEL RELEASE  06/21/2009   right  . CARPAL TUNNEL RELEASE     left hand  . CARPAL TUNNEL RELEASE  10/03/2011   Procedure: CARPAL TUNNEL RELEASE;  Surgeon: Wynonia Sours, MD;  Location: [place];  Service: Orthopedics;  Laterality: Right;  CARPAL TUNNEL WITH HYPOTHENAR FAT PAD TRANSFER  . CERVICAL SPINE SURGERY  01/2005   titanium plate implanted  . CHOLECYSTECTOMY  1990  . ELBOW SURGERY  08/09/2004   decompression ulnar nerve right elbow  . ENTEROLYSIS  10/22/2008   laparoscopic abd. enterolysis  . GASTRIC ROUX-EN-Y  2009  . HEEL SPUR SURGERY  08/1997   left  . HEMORRHOID SURGERY  03/1993  . LAPAROSCOPIC LYSIS INTESTINAL ADHESIONS  02/14/2000  . NAILBED REPAIR  01/10/2005; 08/2011   exc. matrix bilat. great toe  . OTHER  SURGICAL HISTORY  12/1986   pt states that she had surgery to unclog her fallopean tubes  . PORTACATH PLACEMENT N/A 09/24/2016   Procedure: INSERTION PORT-A-CATH WITH [name];  Surgeon: Erroll Luna, MD;  Location: [place];  Service: General;  Laterality: N/A;  . SHOULDER SURGERY     bilat. - (left:  06/2005)  . TONSILLECTOMY  07/1995  . TRIGGER FINGER RELEASE  04/25/2006   decompression A-1 pulley left thumb  . TRIGGER FINGER RELEASE Right 11/11/2018   Procedure: RELEASE TRIGGER FINGER/A-1 PULLEY RIGHT THUMB, RIGHT RING FINGER;  Surgeon: Daryll Brod, MD;  Location: [place];  Service: Orthopedics;  Laterality: Right;  . UTERINE FIBROID SURGERY  12/95, 7/96   x2  . VENA CAVA FILTER PLACEMENT  2009   during Roux-en-Y surg.    There were no vitals filed for this visit.   Subjective Assessment - 04/02/19 1532    Subjective  Patient reports that she was having right shoulder pain over the past year, reports it was progressively getting worse.  She underwent a right RC repair with SAD 03/30/19.  She reports that she has not had much pain but is taking the pain meds, reports has been nauseated the past few days throwing up and overall does not feel well.    Pertinent History  past ACDF    Limitations  Reading;Lifting;House hold activities    Patient Stated Goals  have less pain, normal motions    Currently in Pain?  Yes    Pain Score  6     Pain Location  Shoulder    Pain Orientation  Right;Lateral    Pain Descriptors / Indicators  Aching;Constant    Pain Type  Acute pain;Surgical pain    Pain Radiating Towards  denies    Pain Onset  In the past 7 days    Pain Frequency  Constant    Aggravating Factors   without pain medication, trying to get dress, pain up to 10/10    Pain Relieving Factors  rest, ices pain medication pain at best a 2/10    Effect of Pain on Daily Activities  limits everything         [facility] PT Assessment - 04/02/19 0001      Assessment   Medical Diagnosis   s/p right shoulder RC repair, and SAD    Referring Provider (PT)  [place]    Onset Date/Surgical Date  03/30/19    Hand Dominance  Right      Precautions   Precautions  Shoulder    Precaution Comments  r/c repair Dr. Veverly Fells, no abduction      Balance Screen  Has the patient fallen in the past 6 months  No    Has the patient had a decrease in activity level because of a fear of falling?   No    Is the patient reluctant to leave their home because of a fear of falling?   No      Home Environment   Additional Comments  does housework      Prior Function   Level of Independence  Independent    Vocation  On disability    Vocation Requirements  CVA 2004    Leisure  walking an hour a day      Posture/Postural Control   Posture Comments  in large sling with wedge, gaurded motions, fwd head and rounde shoulders      ROM / Strength   AROM / PROM / Strength  PROM      PROM   PROM Assessment Site  Shoulder    Right/Left Shoulder  Right    Right Shoulder Flexion  80 Degrees    Right Shoulder ABduction  45 Degrees    Right Shoulder Internal Rotation  35 Degrees   arm at side   Right Shoulder External Rotation  0 Degrees   neutral arm at side     Palpation   Palpation comment  tender to touch, dressing intact, some light bruising noted around the bandage                Objective measurements completed on examination: See above findings.      [facility] Adult PT Treatment/Exercise - 04/02/19 0001      Modalities   Modalities  Vasopneumatic      Vasopneumatic   Number Minutes Vasopneumatic   15 minutes    Vasopnuematic Location   Shoulder    Vasopneumatic Pressure  Medium    Vasopneumatic Temperature   40             PT Education - 04/02/19 1708    Education Details  she is doing thigh slides and pendulums now, re-enforced these, added shrugs and retractions    Person(s) Educated  Patient;Other (comment)    Methods  Explanation;Demonstration;Tactile  cues;Verbal cues;Handout    Comprehension  Verbalized understanding;Returned demonstration       PT Short Term Goals - 04/02/19 1719      PT SHORT TERM GOAL #1   Title  independent with initial HEP    Time  2    Period  Weeks    Status  New        PT Long Term Goals - 04/02/19 1719      PT LONG TERM GOAL #1   Title  Patient will demonstrate proper technique with her home exercise program to improve shoulder ROM.    Time  8    Period  Weeks    Status  New      PT LONG TERM GOAL #2   Title  increase AROM of the right shoulder flexion to 150 degrees    Time  12    Period  Weeks    Status  New      PT LONG TERM GOAL #3   Title  report no difficulty with dressing or doing hair    Time  12    Period  Weeks    Status  New      PT LONG TERM GOAL #4   Title  decrease pain 50%    Time  12    Period  Weeks    Status  New      PT LONG TERM GOAL #5   Title  increase strength of the right shoulder to 4/5    Time  12    Period  Weeks    Status  New             Plan - 04/02/19 1709    Clinical Impression Statement  Patient underwent a right RC repair and SAD on 03/30/19.  She is limited in ROM, very guarded, has spasms in the upper traps.  She is in a sling.  Will follow Dr. Veverly Fells' protocol for no abduction, no AROM for the first 6 weeks.    Personal Factors and Comorbidities  Comorbidity 3+    Comorbidities  ACDF, CVA, breast CA, fibromyalgia    Stability/Clinical Decision Making  Evolving/Moderate complexity    Clinical Decision Making  Moderate    Rehab Potential  Good    PT Frequency  2x / week    PT Duration  8 weeks    PT Treatment/Interventions  ADLs/Self Care Home Management;Cryotherapy;Electrical Stimulation;Therapeutic activities;Therapeutic exercise;Manual techniques;Patient/family education    PT Next Visit Plan  Slowly start the protocol of Dr. Veverly Fells [facility] repair    9 and Agree with Plan of Care  Patient       Patient will benefit from skilled  therapeutic intervention in order to improve the following deficits and impairments:  Improper body mechanics, Pain, Increased muscle spasms, Postural dysfunction, Impaired UE functional use, Decreased strength, Decreased range of motion, Increased edema  Visit Diagnosis: Acute pain of right shoulder - Plan: PT plan of care cert/re-cert  Stiffness of right shoulder, not elsewhere classified - Plan: PT plan of care cert/re-cert  Localized edema - Plan: PT plan of care cert/re-cert     Problem List Patient Active Problem List   Diagnosis Date Noted  . Class 2 severe obesity with serious comorbidity and body mass index (BMI) of 37.0 to 37.9 in adult [facility]) 01/15/2019  . Status post gastric surgery 07/29/2018  . Prediabetes 03/06/2018  . Vitamin D deficiency 02/18/2018  . Chemotherapy-induced peripheral neuropathy ([place]) 02/15/2017  . Port catheter in place 11/02/2016  . Genetic testing 09/19/2016  . Pre-operative clearance 07/23/2016  . Malignant neoplasm of lower-outer quadrant of left breast of female, estrogen receptor positive ([place]) 07/10/2016  . Essential hypertension   . Chest pain 09/30/2015  . DVT, lower extremity ([place]) 06/18/2011  . DVT of upper extremity (deep vein thrombosis) ([place]) 06/18/2011  . [place] filter in place 06/18/2011  . History of stroke 06/18/2011  . PFO (patent foramen ovale) 06/18/2011  . Status post gastric bypass for obesity 06/18/2011  . PELVIC PAIN, CHRONIC 07/20/2008  . FIBROIDS, UTERUS 07/16/2008  . ASTHMA 07/16/2008  . FIBROMYALGIA 07/16/2008  . CARPAL TUNNEL SYNDROME, HX OF 07/16/2008  . History of pulmonary embolus (PE) 07/16/2008    Sumner Boast., PT 04/02/2019, 5:22 PM  [address] [place], [name], [address] Phone: [phone]   Fax:  [phone]  Name: [name] MRN: [id number] Date of Birth: Oct 12, 1960

## 2019-04-08 ENCOUNTER — Ambulatory Visit: Payer: Medicare Other | Admitting: Physical Therapy

## 2019-04-08 ENCOUNTER — Other Ambulatory Visit: Payer: Self-pay

## 2019-04-08 DIAGNOSIS — R600 Localized edema: Secondary | ICD-10-CM | POA: Diagnosis not present

## 2019-04-08 DIAGNOSIS — M25511 Pain in right shoulder: Secondary | ICD-10-CM

## 2019-04-08 DIAGNOSIS — M25611 Stiffness of right shoulder, not elsewhere classified: Secondary | ICD-10-CM | POA: Diagnosis not present

## 2019-04-08 NOTE — Therapy (Signed)
[place] [place], [name], [address] Phone: [phone]   Fax:  [phone]  Physical Therapy Treatment  Patient Details  Name: [name] MRN: [id number] Date of Birth: [birth year] Referring Provider (PT): [name] Date: 04/08/2019  PT End of Session - 04/08/19 1550    Visit Number  2    PT Start Time  3716    PT Stop Time  1610    PT Time Calculation (min)  55 min       Past Medical History:  Diagnosis Date  . Anemia    since bypass  . Arthritis    osteoarthritis  . Asthma    states no asthma attack since 2002  . Back pain   . Breast cancer ([place]) 06/26/16 bx   left breast  . Breast cancer ([place])   . Chronic back pain   . Complication of anesthesia    states takes more than normal to put her to sleep  . Constipation   . Dental bridge present    upper  . Dental crowns present   . DVT of upper extremity (deep vein thrombosis) ([place])   . Fibromyalgia   . Genetic testing 09/19/2016   Ms. Schlereth underwent genetic counseling and testing for hereditary cancer syndromes on 08/21/2016. Her results were negative for mutations in all 46 genes analyzed by [employer]'s 46-gene Common Hereditary Cancers Panel. Genes analyzed include: APC, ATM, AXIN2, BARD1, BMPR1A, BRCA1, BRCA2, BRIP1, CDH1, CDKN2A, CHEK2, CTNNA1, DICER1, EPCAM, GREM1, HOXB13, KIT, MEN1, MLH1, MSH2, MSH3, MSH6, MUTYH, NBN,   . Headache(784.0)    migraines  . History of blood transfusion 06/2005  . History of gallstones   . History of pneumonia   . History of shingles 07/2011  . History of shingles   . HTN (hypertension)   . Itching   . Joint pain   . Muscle weakness   . Neuropathy   . Normal coronary arteries 2003  . Osteoarthropathy   . Personal history of chemotherapy 2018  . Personal history of radiation therapy 2018  . PFO (patent foramen ovale)   . Presence of inferior vena cava filter   . Pulmonary embolism  ([place])   . Sleep apnea    used CPAP until after bypass surg.  . Status post gastric bypass for obesity   . Stomach pain   . Stroke [facility]) 12/2002   right-sided weakness  . Urinary incontinence     Past Surgical History:  Procedure Laterality Date  . ABDOMINAL HYSTERECTOMY  11/1997   complete  . ANAL RECTAL MANOMETRY N/A 01/21/2019   Procedure: ANO RECTAL MANOMETRY;  Surgeon: Arta Silence, MD;  [facility];  Service: Endoscopy;  Laterality: N/A;  . ANTERIOR CERVICAL DECOMP/DISCECTOMY FUSION  02/05/2005   C5-6  . APPENDECTOMY  10/22/2008   laparoscopic  . BREAST LUMPECTOMY Left 07/2016  . BREAST LUMPECTOMY WITH RADIOACTIVE SEED AND SENTINEL LYMPH NODE BIOPSY Left 07/26/2016   Procedure: BREAST LUMPECTOMY WITH RADIOACTIVE SEED AND SENTINEL LYMPH NODE BIOPSY;  Surgeon: Erroll Luna, MD;  Location: [place];  Service: General;  Laterality: Left;  . BUNIONECTOMY  05/1980   both feet  . BUNIONECTOMY  08/2011   left foot  . CARDIAC CATHETERIZATION  03/04/2002  . CARPAL TUNNEL RELEASE  06/21/2009   right  . CARPAL TUNNEL RELEASE     left hand  . CARPAL TUNNEL RELEASE  10/03/2011   Procedure: CARPAL TUNNEL  RELEASE;  Surgeon: Wynonia Sours, MD;  Location: [place];  Service: Orthopedics;  Laterality: Right;  CARPAL TUNNEL WITH HYPOTHENAR FAT PAD TRANSFER  . CERVICAL SPINE SURGERY  01/2005   titanium plate implanted  . CHOLECYSTECTOMY  1990  . ELBOW SURGERY  08/09/2004   decompression ulnar nerve right elbow  . ENTEROLYSIS  10/22/2008   laparoscopic abd. enterolysis  . GASTRIC ROUX-EN-Y  2009  . HEEL SPUR SURGERY  08/1997   left  . HEMORRHOID SURGERY  03/1993  . LAPAROSCOPIC LYSIS INTESTINAL ADHESIONS  02/14/2000  . NAILBED REPAIR  01/10/2005; 08/2011   exc. matrix bilat. great toe  . OTHER SURGICAL HISTORY  12/1986   pt states that she had surgery to unclog her fallopean tubes  . PORTACATH PLACEMENT N/A 09/24/2016   Procedure: INSERTION PORT-A-CATH WITH  [name];  Surgeon: Erroll Luna, MD;  Location: [place];  Service: General;  Laterality: N/A;  . SHOULDER SURGERY     bilat. - (left:  06/2005)  . TONSILLECTOMY  07/1995  . TRIGGER FINGER RELEASE  04/25/2006   decompression A-1 pulley left thumb  . TRIGGER FINGER RELEASE Right 11/11/2018   Procedure: RELEASE TRIGGER FINGER/A-1 PULLEY RIGHT THUMB, RIGHT RING FINGER;  Surgeon: Daryll Brod, MD;  Location: [place];  Service: Orthopedics;  Laterality: Right;  . UTERINE FIBROID SURGERY  12/95, 7/96   x2  . VENA CAVA FILTER PLACEMENT  2009   during Roux-en-Y surg.    There were no vitals filed for this visit.  Subjective Assessment - 04/08/19 1543    Subjective  "feeling ok, just a little aching"    Currently in Pain?  No/denies                       [facility] Adult PT Treatment/Exercise - 04/08/19 0001      Exercises   Exercises  Shoulder      Shoulder Exercises: Supine   Other Supine Exercises  isometric flexion and extension 5 reps, 3 sec      Shoulder Exercises: Seated   Row  Both;Strengthening;10 reps      Modalities   Modalities  Vasopneumatic      Vasopneumatic   Number Minutes Vasopneumatic   15 minutes    Vasopnuematic Location   Shoulder    Vasopneumatic Pressure  Medium    Vasopneumatic Temperature   40      Manual Therapy   Manual Therapy  Passive ROM    Passive ROM  flexion, scaption, ER, and IR, no abduction               PT Short Term Goals - 04/02/19 1719      PT SHORT TERM GOAL #1   Title  independent with initial HEP    Time  2    Period  Weeks    Status  New        PT Long Term Goals - 04/02/19 1719      PT LONG TERM GOAL #1   Title  Patient will demonstrate proper technique with her home exercise program to improve shoulder ROM.    Time  8    Period  Weeks    Status  New      PT LONG TERM GOAL #2   Title  increase AROM of the right shoulder flexion to 150 degrees    Time  12    Period  Weeks    Status   New  PT LONG TERM GOAL #3   Title  report no difficulty with dressing or doing hair    Time  12    Period  Weeks    Status  New      PT LONG TERM GOAL #4   Title  decrease pain 50%    Time  12    Period  Weeks    Status  New      PT LONG TERM GOAL #5   Title  increase strength of the right shoulder to 4/5    Time  12    Period  Weeks    Status  New            Plan - 04/08/19 1546    Clinical Impression Statement  pt tolerated treatment well. she had no increase of pain with PROM. pt needs continual cues to relax with PROM. pt did all motions of the shoulder, except abduction per the doctor's protocol. pt able to perform isometric exercises and scapular retraction.    Personal Factors and Comorbidities  Comorbidity 3+    Comorbidities  ACDF, CVA, breast CA, fibromyalgia    Stability/Clinical Decision Making  Evolving/Moderate complexity    Rehab Potential  Good    PT Frequency  2x / week    PT Duration  8 weeks    PT Treatment/Interventions  ADLs/Self Care Home Management;Cryotherapy;Electrical Stimulation;Therapeutic activities;Therapeutic exercise;Manual techniques;Patient/family education    PT Next Visit Plan  Slowly start the protocol of Dr. Veverly Fells [facility] repair       Patient will benefit from skilled therapeutic intervention in order to improve the following deficits and impairments:  Improper body mechanics, Pain, Increased muscle spasms, Postural dysfunction, Impaired UE functional use, Decreased strength, Decreased range of motion, Increased edema  Visit Diagnosis: Acute pain of right shoulder  Stiffness of right shoulder, not elsewhere classified     Problem List Patient Active Problem List   Diagnosis Date Noted  . Class 2 severe obesity with serious comorbidity and body mass index (BMI) of 37.0 to 37.9 in adult [facility]) 01/15/2019  . Status post gastric surgery 07/29/2018  . Prediabetes 03/06/2018  . Vitamin D deficiency 02/18/2018  .  Chemotherapy-induced peripheral neuropathy ([place]) 02/15/2017  . Port catheter in place 11/02/2016  . Genetic testing 09/19/2016  . Pre-operative clearance 07/23/2016  . Malignant neoplasm of lower-outer quadrant of left breast of female, estrogen receptor positive ([place]) 07/10/2016  . Essential hypertension   . Chest pain 09/30/2015  . DVT, lower extremity ([place]) 06/18/2011  . DVT of upper extremity (deep vein thrombosis) ([place]) 06/18/2011  . [place] filter in place 06/18/2011  . History of stroke 06/18/2011  . PFO (patent foramen ovale) 06/18/2011  . Status post gastric bypass for obesity 06/18/2011  . PELVIC PAIN, CHRONIC 07/20/2008  . FIBROIDS, UTERUS 07/16/2008  . ASTHMA 07/16/2008  . FIBROMYALGIA 07/16/2008  . CARPAL TUNNEL SYNDROME, HX OF 07/16/2008  . History of pulmonary embolus (PE) 07/16/2008    Barrett Henle, SPTA 04/08/2019, 4:01 PM  [place], [name], [address] Phone: [phone]   Fax:  [phone]  Name: [name] MRN: [id number] Date of Birth: 02-20-1961

## 2019-04-10 ENCOUNTER — Other Ambulatory Visit: Payer: Self-pay

## 2019-04-10 ENCOUNTER — Ambulatory Visit: Payer: Medicare Other | Admitting: Physical Therapy

## 2019-04-10 ENCOUNTER — Encounter: Payer: Self-pay | Admitting: Physical Therapy

## 2019-04-10 DIAGNOSIS — M25611 Stiffness of right shoulder, not elsewhere classified: Secondary | ICD-10-CM | POA: Diagnosis not present

## 2019-04-10 DIAGNOSIS — M25511 Pain in right shoulder: Secondary | ICD-10-CM

## 2019-04-10 DIAGNOSIS — R600 Localized edema: Secondary | ICD-10-CM | POA: Diagnosis not present

## 2019-04-10 NOTE — Therapy (Signed)
[address] [place], [name], [address] Phone: [phone]   Fax:  [phone]  Physical Therapy Treatment  Patient Details  Name: [name] MRN: [id number] Date of Birth: 09-02-1960 Referring Provider (PT): [name] Date: 04/10/2019  PT End of Session - 04/10/19 5176    Visit Number  3    Date for PT Re-Evaluation  06/02/19    PT Start Time  1607    PT Stop Time  1103    PT Time Calculation (min)  48 min    Activity Tolerance  Patient tolerated treatment well    Behavior During Therapy  [facility] for tasks assessed/performed;Anxious       Past Medical History:  Diagnosis Date  . Anemia    since bypass  . Arthritis    osteoarthritis  . Asthma    states no asthma attack since 2002  . Back pain   . Breast cancer ([place]) 06/26/16 bx   left breast  . Breast cancer ([place])   . Chronic back pain   . Complication of anesthesia    states takes more than normal to put her to sleep  . Constipation   . Dental bridge present    upper  . Dental crowns present   . DVT of upper extremity (deep vein thrombosis) ([employer])   . Fibromyalgia   . Genetic testing 09/19/2016   Ms. Swindler underwent genetic counseling and testing for hereditary cancer syndromes on 08/21/2016. Her results were negative for mutations in all 46 genes analyzed by [employer]'s 46-gene Common Hereditary Cancers Panel. Genes analyzed include: APC, ATM, AXIN2, BARD1, BMPR1A, BRCA1, BRCA2, BRIP1, CDH1, CDKN2A, CHEK2, CTNNA1, DICER1, EPCAM, GREM1, HOXB13, KIT, MEN1, MLH1, MSH2, MSH3, MSH6, MUTYH, NBN,   . Headache(784.0)    migraines  . History of blood transfusion 06/2005  . History of gallstones   . History of pneumonia   . History of shingles 07/2011  . History of shingles   . HTN (hypertension)   . Itching   . Joint pain   . Muscle weakness   . Neuropathy   . Normal coronary arteries 2003  . Osteoarthropathy   . Personal history  of chemotherapy 2018  . Personal history of radiation therapy 2018  . PFO (patent foramen ovale)   . Presence of inferior vena cava filter   . Pulmonary embolism ([place])   . Sleep apnea    used CPAP until after bypass surg.  . Status post gastric bypass for obesity   . Stomach pain   . Stroke [facility]) 12/2002   right-sided weakness  . Urinary incontinence     Past Surgical History:  Procedure Laterality Date  . ABDOMINAL HYSTERECTOMY  11/1997   complete  . ANAL RECTAL MANOMETRY N/A 01/21/2019   Procedure: ANO RECTAL MANOMETRY;  Surgeon: Arta Silence, MD;  [facility];  Service: Endoscopy;  Laterality: N/A;  . ANTERIOR CERVICAL DECOMP/DISCECTOMY FUSION  02/05/2005   C5-6  . APPENDECTOMY  10/22/2008   laparoscopic  . BREAST LUMPECTOMY Left 07/2016  . BREAST LUMPECTOMY WITH RADIOACTIVE SEED AND SENTINEL LYMPH NODE BIOPSY Left 07/26/2016   Procedure: BREAST LUMPECTOMY WITH RADIOACTIVE SEED AND SENTINEL LYMPH NODE BIOPSY;  Surgeon: Erroll Luna, MD;  Location: [place];  Service: General;  Laterality: Left;  . BUNIONECTOMY  05/1980   both feet  . BUNIONECTOMY  08/2011   left foot  . CARDIAC CATHETERIZATION  03/04/2002  . CARPAL  TUNNEL RELEASE  06/21/2009   right  . CARPAL TUNNEL RELEASE     left hand  . CARPAL TUNNEL RELEASE  10/03/2011   Procedure: CARPAL TUNNEL RELEASE;  Surgeon: Wynonia Sours, MD;  Location: [place];  Service: Orthopedics;  Laterality: Right;  CARPAL TUNNEL WITH HYPOTHENAR FAT PAD TRANSFER  . CERVICAL SPINE SURGERY  01/2005   titanium plate implanted  . CHOLECYSTECTOMY  1990  . ELBOW SURGERY  08/09/2004   decompression ulnar nerve right elbow  . ENTEROLYSIS  10/22/2008   laparoscopic abd. enterolysis  . GASTRIC ROUX-EN-Y  2009  . HEEL SPUR SURGERY  08/1997   left  . HEMORRHOID SURGERY  03/1993  . LAPAROSCOPIC LYSIS INTESTINAL ADHESIONS  02/14/2000  . NAILBED REPAIR  01/10/2005; 08/2011   exc. matrix bilat. great toe  . OTHER  SURGICAL HISTORY  12/1986   pt states that she had surgery to unclog her fallopean tubes  . PORTACATH PLACEMENT N/A 09/24/2016   Procedure: INSERTION PORT-A-CATH WITH [name];  Surgeon: Erroll Luna, MD;  Location: [place];  Service: General;  Laterality: N/A;  . SHOULDER SURGERY     bilat. - (left:  06/2005)  . TONSILLECTOMY  07/1995  . TRIGGER FINGER RELEASE  04/25/2006   decompression A-1 pulley left thumb  . TRIGGER FINGER RELEASE Right 11/11/2018   Procedure: RELEASE TRIGGER FINGER/A-1 PULLEY RIGHT THUMB, RIGHT RING FINGER;  Surgeon: Daryll Brod, MD;  Location: [place];  Service: Orthopedics;  Laterality: Right;  . UTERINE FIBROID SURGERY  12/95, 7/96   x2  . VENA CAVA FILTER PLACEMENT  2009   during Roux-en-Y surg.    There were no vitals filed for this visit.  Subjective Assessment - 04/10/19 1021    Subjective  Some soreness today, feels like a band across her shoulder    Currently in Pain?  No/denies    Pain Score  0-No pain                       [facility] Adult PT Treatment/Exercise - 04/10/19 0001      Shoulder Exercises: Seated   Retraction  Both;10 reps   x2, 3 sec hold    Other Seated Exercises  AAROM biceps curls with cane 2x10       Modalities   Modalities  Vasopneumatic      Vasopneumatic   Number Minutes Vasopneumatic   10 minutes    Vasopnuematic Location   Shoulder    Vasopneumatic Pressure  Medium    Vasopneumatic Temperature   40      Manual Therapy   Manual Therapy  Passive ROM    Manual therapy comments  cues to relax    Passive ROM  flexion, scaption, ER, and IR, no abduction               PT Short Term Goals - 04/02/19 1719      PT SHORT TERM GOAL #1   Title  independent with initial HEP    Time  2    Period  Weeks    Status  New        PT Long Term Goals - 04/02/19 1719      PT LONG TERM GOAL #1   Title  Patient will demonstrate proper technique with her home exercise program to improve shoulder ROM.     Time  8    Period  Weeks    Status  New  PT LONG TERM GOAL #2   Title  increase AROM of the right shoulder flexion to 150 degrees    Time  12    Period  Weeks    Status  New      PT LONG TERM GOAL #3   Title  report no difficulty with dressing or doing hair    Time  12    Period  Weeks    Status  New      PT LONG TERM GOAL #4   Title  decrease pain 50%    Time  12    Period  Weeks    Status  New      PT LONG TERM GOAL #5   Title  increase strength of the right shoulder to 4/5    Time  12    Period  Weeks    Status  New            Plan - 04/10/19 1054    Clinical Impression Statement  No pain only soreness and discomfort with PROM. Motions is well overall, tactile cues needed with scapula retraction. No issues with curls.    Personal Factors and Comorbidities  Comorbidity 3+    Comorbidities  ACDF, CVA, breast CA, fibromyalgia    Stability/Clinical Decision Making  Evolving/Moderate complexity    Rehab Potential  Good    PT Frequency  2x / week    PT Duration  8 weeks    PT Treatment/Interventions  ADLs/Self Care Home Management;Cryotherapy;Electrical Stimulation;Therapeutic activities;Therapeutic exercise;Manual techniques;Patient/family education    PT Next Visit Plan  Slowly start the protocol of Dr. Veverly Fells [facility] repair       Patient will benefit from skilled therapeutic intervention in order to improve the following deficits and impairments:  Improper body mechanics, Pain, Increased muscle spasms, Postural dysfunction, Impaired UE functional use, Decreased strength, Decreased range of motion, Increased edema  Visit Diagnosis: Stiffness of right shoulder, not elsewhere classified  Localized edema  Acute pain of right shoulder     Problem List Patient Active Problem List   Diagnosis Date Noted  . Class 2 severe obesity with serious comorbidity and body mass index (BMI) of 37.0 to 37.9 in adult [facility]) 01/15/2019  . Status post gastric surgery  07/29/2018  . Prediabetes 03/06/2018  . Vitamin D deficiency 02/18/2018  . Chemotherapy-induced peripheral neuropathy ([place]) 02/15/2017  . Port catheter in place 11/02/2016  . Genetic testing 09/19/2016  . Pre-operative clearance 07/23/2016  . Malignant neoplasm of lower-outer quadrant of left breast of female, estrogen receptor positive ([place]) 07/10/2016  . Essential hypertension   . Chest pain 09/30/2015  . DVT, lower extremity ([place]) 06/18/2011  . DVT of upper extremity (deep vein thrombosis) ([place]) 06/18/2011  . [place] filter in place 06/18/2011  . History of stroke 06/18/2011  . PFO (patent foramen ovale) 06/18/2011  . Status post gastric bypass for obesity 06/18/2011  . PELVIC PAIN, CHRONIC 07/20/2008  . FIBROIDS, UTERUS 07/16/2008  . ASTHMA 07/16/2008  . FIBROMYALGIA 07/16/2008  . CARPAL TUNNEL SYNDROME, HX OF 07/16/2008  . History of pulmonary embolus (PE) 07/16/2008    Scot Jun, PTA 04/10/2019, 11:05 AM  [address] [place], [name], [address] Phone: [phone]   Fax:  [phone]  Name: Elania Crowl MRN: [id number] Date of Birth: Mar 09, 1961

## 2019-04-13 ENCOUNTER — Other Ambulatory Visit: Payer: Self-pay

## 2019-04-13 ENCOUNTER — Encounter: Payer: Self-pay | Admitting: Physical Therapy

## 2019-04-13 ENCOUNTER — Ambulatory Visit: Payer: Medicare Other | Admitting: Physical Therapy

## 2019-04-13 DIAGNOSIS — M25511 Pain in right shoulder: Secondary | ICD-10-CM | POA: Diagnosis not present

## 2019-04-13 DIAGNOSIS — M25611 Stiffness of right shoulder, not elsewhere classified: Secondary | ICD-10-CM

## 2019-04-13 DIAGNOSIS — R600 Localized edema: Secondary | ICD-10-CM

## 2019-04-13 NOTE — Therapy (Signed)
[address] [place], [name], [address] Phone: [phone]   Fax:  [phone]  Physical Therapy Treatment  Patient Details  Name: [name] MRN: [id number] Date of Birth: Mar 25, 1961 Referring Provider (PT): [name] Date: 04/13/2019  PT End of Session - 04/13/19 1732    Visit Number  4    Date for PT Re-Evaluation  06/02/19    PT Start Time  5053    PT Stop Time  9767    PT Time Calculation (min)  52 min    Activity Tolerance  Patient tolerated treatment well    Behavior During Therapy  [facility] for tasks assessed/performed;Anxious       Past Medical History:  Diagnosis Date  . Anemia    since bypass  . Arthritis    osteoarthritis  . Asthma    states no asthma attack since 2002  . Back pain   . Breast cancer ([place]) 06/26/16 bx   left breast  . Breast cancer ([place])   . Chronic back pain   . Complication of anesthesia    states takes more than normal to put her to sleep  . Constipation   . Dental bridge present    upper  . Dental crowns present   . DVT of upper extremity (deep vein thrombosis) ([place])   . Fibromyalgia   . Genetic testing 09/19/2016   Ms. Beichner underwent genetic counseling and testing for hereditary cancer syndromes on 08/21/2016. Her results were negative for mutations in all 46 genes analyzed by [employer]'s 46-gene Common Hereditary Cancers Panel. Genes analyzed include: APC, ATM, AXIN2, BARD1, BMPR1A, BRCA1, BRCA2, BRIP1, CDH1, CDKN2A, CHEK2, CTNNA1, DICER1, EPCAM, GREM1, HOXB13, KIT, MEN1, MLH1, MSH2, MSH3, MSH6, MUTYH, NBN,   . Headache(784.0)    migraines  . History of blood transfusion 06/2005  . History of gallstones   . History of pneumonia   . History of shingles 07/2011  . History of shingles   . HTN (hypertension)   . Itching   . Joint pain   . Muscle weakness   . Neuropathy   . Normal coronary arteries 2003  . Osteoarthropathy   . Personal history  of chemotherapy 2018  . Personal history of radiation therapy 2018  . PFO (patent foramen ovale)   . Presence of inferior vena cava filter   . Pulmonary embolism ([place])   . Sleep apnea    used CPAP until after bypass surg.  . Status post gastric bypass for obesity   . Stomach pain   . Stroke [facility]) 12/2002   right-sided weakness  . Urinary incontinence     Past Surgical History:  Procedure Laterality Date  . ABDOMINAL HYSTERECTOMY  11/1997   complete  . ANAL RECTAL MANOMETRY N/A 01/21/2019   Procedure: ANO RECTAL MANOMETRY;  Surgeon: Arta Silence, MD;  [facility];  Service: Endoscopy;  Laterality: N/A;  . ANTERIOR CERVICAL DECOMP/DISCECTOMY FUSION  02/05/2005   C5-6  . APPENDECTOMY  10/22/2008   laparoscopic  . BREAST LUMPECTOMY Left 07/2016  . BREAST LUMPECTOMY WITH RADIOACTIVE SEED AND SENTINEL LYMPH NODE BIOPSY Left 07/26/2016   Procedure: BREAST LUMPECTOMY WITH RADIOACTIVE SEED AND SENTINEL LYMPH NODE BIOPSY;  Surgeon: Erroll Luna, MD;  Location: [place];  Service: General;  Laterality: Left;  . BUNIONECTOMY  05/1980   both feet  . BUNIONECTOMY  08/2011   left foot  . CARDIAC CATHETERIZATION  03/04/2002  . CARPAL  TUNNEL RELEASE  06/21/2009   right  . CARPAL TUNNEL RELEASE     left hand  . CARPAL TUNNEL RELEASE  10/03/2011   Procedure: CARPAL TUNNEL RELEASE;  Surgeon: Wynonia Sours, MD;  Location: [place];  Service: Orthopedics;  Laterality: Right;  CARPAL TUNNEL WITH HYPOTHENAR FAT PAD TRANSFER  . CERVICAL SPINE SURGERY  01/2005   titanium plate implanted  . CHOLECYSTECTOMY  1990  . ELBOW SURGERY  08/09/2004   decompression ulnar nerve right elbow  . ENTEROLYSIS  10/22/2008   laparoscopic abd. enterolysis  . GASTRIC ROUX-EN-Y  2009  . HEEL SPUR SURGERY  08/1997   left  . HEMORRHOID SURGERY  03/1993  . LAPAROSCOPIC LYSIS INTESTINAL ADHESIONS  02/14/2000  . NAILBED REPAIR  01/10/2005; 08/2011   exc. matrix bilat. great toe  . OTHER  SURGICAL HISTORY  12/1986   pt states that she had surgery to unclog her fallopean tubes  . PORTACATH PLACEMENT N/A 09/24/2016   Procedure: INSERTION PORT-A-CATH WITH [name];  Surgeon: Erroll Luna, MD;  Location: [place];  Service: General;  Laterality: N/A;  . SHOULDER SURGERY     bilat. - (left:  06/2005)  . TONSILLECTOMY  07/1995  . TRIGGER FINGER RELEASE  04/25/2006   decompression A-1 pulley left thumb  . TRIGGER FINGER RELEASE Right 11/11/2018   Procedure: RELEASE TRIGGER FINGER/A-1 PULLEY RIGHT THUMB, RIGHT RING FINGER;  Surgeon: Daryll Brod, MD;  Location: [place];  Service: Orthopedics;  Laterality: Right;  . UTERINE FIBROID SURGERY  12/95, 7/96   x2  . VENA CAVA FILTER PLACEMENT  2009   during Roux-en-Y surg.    There were no vitals filed for this visit.  Subjective Assessment - 04/13/19 1656    Subjective  Patient comes in and reports that after the last visit she has been hurting, c/o pulling and tightness inthe right anterior shoulder and under the arm  Reports very painful over the weekend    Currently in Pain?  Yes    Pain Score  7     Pain Location  Shoulder    Pain Orientation  Right;Anterior    Pain Descriptors / Indicators  Aching;Sore    Aggravating Factors   moving the arm                       [facility] Adult PT Treatment/Exercise - 04/13/19 0001      Shoulder Exercises: Seated   Retraction  20 reps;Both    Other Seated Exercises  shrugs x 20    Other Seated Exercises  pulleys flexion with assist      Shoulder Exercises: Standing   Other Standing Exercises  ball on mat table rolling with assist, small circles iwth assst      Modalities   Modalities  Vasopneumatic;Electrical Stimulation      Electrical Stimulation   Electrical Stimulation Location  right shoulder    Electrical Stimulation Action  IFC    Electrical Stimulation Parameters  sitting    Electrical Stimulation Goals  Pain      Vasopneumatic   Number Minutes  Vasopneumatic   10 minutes    Vasopnuematic Location   Shoulder    Vasopneumatic Pressure  Medium    Vasopneumatic Temperature   40      Manual Therapy   Manual Therapy  Passive ROM    Manual therapy comments  cues to relax    Passive ROM  flexion, scaption, ER, and IR,  no abduction, gentle shoulder distraction to relax and during the motions               PT Short Term Goals - 04/13/19 1734      PT SHORT TERM GOAL #1   Title  independent with initial HEP    Status  Achieved        PT Long Term Goals - 04/02/19 1719      PT LONG TERM GOAL #1   Title  Patient will demonstrate proper technique with her home exercise program to improve shoulder ROM.    Time  8    Period  Weeks    Status  New      PT LONG TERM GOAL #2   Title  increase AROM of the right shoulder flexion to 150 degrees    Time  12    Period  Weeks    Status  New      PT LONG TERM GOAL #3   Title  report no difficulty with dressing or doing hair    Time  12    Period  Weeks    Status  New      PT LONG TERM GOAL #4   Title  decrease pain 50%    Time  12    Period  Weeks    Status  New      PT LONG TERM GOAL #5   Title  increase strength of the right shoulder to 4/5    Time  12    Period  Weeks    Status  New            Plan - 04/13/19 1732    Clinical Impression Statement  Patient reports that she has been really sore since the last visit, she c/o anterior shoulder and pec pain and pain under the arm, I think it is just mms from gaurding so much, she felt like we did too much at the last visit, I added some joint distraction during the motions and added estim to help with pain    PT Next Visit Plan  PROM only, no abduction    Consulted and Agree with Plan of Care  Patient       Patient will benefit from skilled therapeutic intervention in order to improve the following deficits and impairments:  Improper body mechanics, Pain, Increased muscle spasms, Postural dysfunction, Impaired  UE functional use, Decreased strength, Decreased range of motion, Increased edema  Visit Diagnosis: Stiffness of right shoulder, not elsewhere classified  Localized edema  Acute pain of right shoulder     Problem List Patient Active Problem List   Diagnosis Date Noted  . Class 2 severe obesity with serious comorbidity and body mass index (BMI) of 37.0 to 37.9 in adult [facility]) 01/15/2019  . Status post gastric surgery 07/29/2018  . Prediabetes 03/06/2018  . Vitamin D deficiency 02/18/2018  . Chemotherapy-induced peripheral neuropathy ([place]) 02/15/2017  . Port catheter in place 11/02/2016  . Genetic testing 09/19/2016  . Pre-operative clearance 07/23/2016  . Malignant neoplasm of lower-outer quadrant of left breast of female, estrogen receptor positive ([place]) 07/10/2016  . Essential hypertension   . Chest pain 09/30/2015  . DVT, lower extremity ([place]) 06/18/2011  . DVT of upper extremity (deep vein thrombosis) ([place]) 06/18/2011  . Greenfield filter in place 06/18/2011  . History of stroke 06/18/2011  . PFO (patent foramen ovale) 06/18/2011  . Status post gastric bypass for obesity 06/18/2011  . PELVIC PAIN, CHRONIC  07/20/2008  . FIBROIDS, UTERUS 07/16/2008  . ASTHMA 07/16/2008  . FIBROMYALGIA 07/16/2008  . CARPAL TUNNEL SYNDROME, HX OF 07/16/2008  . History of pulmonary embolus (PE) 07/16/2008    Sumner Boast., PT 04/13/2019, 5:35 PM  [address] [place], [name], [address] Phone: [phone]   Fax:  [phone]  Name: Arnisha Laffoon MRN: [id number] Date of Birth: December 04, 1960

## 2019-04-15 ENCOUNTER — Other Ambulatory Visit: Payer: Self-pay

## 2019-04-15 ENCOUNTER — Ambulatory Visit: Payer: Medicare Other | Admitting: Physical Therapy

## 2019-04-15 ENCOUNTER — Encounter: Payer: Self-pay | Admitting: Physical Therapy

## 2019-04-15 DIAGNOSIS — R600 Localized edema: Secondary | ICD-10-CM

## 2019-04-15 DIAGNOSIS — M25611 Stiffness of right shoulder, not elsewhere classified: Secondary | ICD-10-CM | POA: Diagnosis not present

## 2019-04-15 DIAGNOSIS — M25511 Pain in right shoulder: Secondary | ICD-10-CM

## 2019-04-15 NOTE — Therapy (Signed)
[address] [place], [name], [address] Phone: [phone]   Fax:  [phone]  Physical Therapy Treatment  Patient Details  Name: [name] MRN: [id number] Date of Birth: [birth year] Referring Provider (PT): [name] Date: 04/15/2019  PT End of Session - 04/15/19 1215    Visit Number  5    Date for PT Re-Evaluation  06/02/19    PT Start Time  1215    PT Stop Time  1304    PT Time Calculation (min)  49 min    Activity Tolerance  Patient tolerated treatment well    Behavior During Therapy  [facility] for tasks assessed/performed       Past Medical History:  Diagnosis Date  . Anemia    since bypass  . Arthritis    osteoarthritis  . Asthma    states no asthma attack since 2002  . Back pain   . Breast cancer ([place]) 06/26/16 bx   left breast  . Breast cancer ([place])   . Chronic back pain   . Complication of anesthesia    states takes more than normal to put her to sleep  . Constipation   . Dental bridge present    upper  . Dental crowns present   . DVT of upper extremity (deep vein thrombosis) ([place])   . Fibromyalgia   . Genetic testing 09/19/2016   Ms. Wadding underwent genetic counseling and testing for hereditary cancer syndromes on 08/21/2016. Her results were negative for mutations in all 46 genes analyzed by [employer]'s 46-gene Common Hereditary Cancers Panel. Genes analyzed include: APC, ATM, AXIN2, BARD1, BMPR1A, BRCA1, BRCA2, BRIP1, CDH1, CDKN2A, CHEK2, CTNNA1, DICER1, EPCAM, GREM1, HOXB13, KIT, MEN1, MLH1, MSH2, MSH3, MSH6, MUTYH, NBN,   . Headache(784.0)    migraines  . History of blood transfusion 06/2005  . History of gallstones   . History of pneumonia   . History of shingles 07/2011  . History of shingles   . HTN (hypertension)   . Itching   . Joint pain   . Muscle weakness   . Neuropathy   . Normal coronary arteries 2003  . Osteoarthropathy   . Personal history of  chemotherapy 2018  . Personal history of radiation therapy 2018  . PFO (patent foramen ovale)   . Presence of inferior vena cava filter   . Pulmonary embolism ([place])   . Sleep apnea    used CPAP until after bypass surg.  . Status post gastric bypass for obesity   . Stomach pain   . Stroke [facility]) 12/2002   right-sided weakness  . Urinary incontinence     Past Surgical History:  Procedure Laterality Date  . ABDOMINAL HYSTERECTOMY  11/1997   complete  . ANAL RECTAL MANOMETRY N/A 01/21/2019   Procedure: ANO RECTAL MANOMETRY;  Surgeon: Arta Silence, MD;  [facility];  Service: Endoscopy;  Laterality: N/A;  . ANTERIOR CERVICAL DECOMP/DISCECTOMY FUSION  02/05/2005   C5-6  . APPENDECTOMY  10/22/2008   laparoscopic  . BREAST LUMPECTOMY Left 07/2016  . BREAST LUMPECTOMY WITH RADIOACTIVE SEED AND SENTINEL LYMPH NODE BIOPSY Left 07/26/2016   Procedure: BREAST LUMPECTOMY WITH RADIOACTIVE SEED AND SENTINEL LYMPH NODE BIOPSY;  Surgeon: Erroll Luna, MD;  Location: [place];  Service: General;  Laterality: Left;  . BUNIONECTOMY  05/1980   both feet  . BUNIONECTOMY  08/2011   left foot  . CARDIAC CATHETERIZATION  03/04/2002  . CARPAL  TUNNEL RELEASE  06/21/2009   right  . CARPAL TUNNEL RELEASE     left hand  . CARPAL TUNNEL RELEASE  10/03/2011   Procedure: CARPAL TUNNEL RELEASE;  Surgeon: Wynonia Sours, MD;  Location: [place];  Service: Orthopedics;  Laterality: Right;  CARPAL TUNNEL WITH HYPOTHENAR FAT PAD TRANSFER  . CERVICAL SPINE SURGERY  01/2005   titanium plate implanted  . CHOLECYSTECTOMY  1990  . ELBOW SURGERY  08/09/2004   decompression ulnar nerve right elbow  . ENTEROLYSIS  10/22/2008   laparoscopic abd. enterolysis  . GASTRIC ROUX-EN-Y  2009  . HEEL SPUR SURGERY  08/1997   left  . HEMORRHOID SURGERY  03/1993  . LAPAROSCOPIC LYSIS INTESTINAL ADHESIONS  02/14/2000  . NAILBED REPAIR  01/10/2005; 08/2011   exc. matrix bilat. great toe  . OTHER  SURGICAL HISTORY  12/1986   pt states that she had surgery to unclog her fallopean tubes  . PORTACATH PLACEMENT N/A 09/24/2016   Procedure: INSERTION PORT-A-CATH WITH [name];  Surgeon: Erroll Luna, MD;  Location: [place];  Service: General;  Laterality: N/A;  . SHOULDER SURGERY     bilat. - (left:  06/2005)  . TONSILLECTOMY  07/1995  . TRIGGER FINGER RELEASE  04/25/2006   decompression A-1 pulley left thumb  . TRIGGER FINGER RELEASE Right 11/11/2018   Procedure: RELEASE TRIGGER FINGER/A-1 PULLEY RIGHT THUMB, RIGHT RING FINGER;  Surgeon: Daryll Brod, MD;  Location: [place];  Service: Orthopedics;  Laterality: Right;  . UTERINE FIBROID SURGERY  12/95, 7/96   x2  . VENA CAVA FILTER PLACEMENT  2009   during Roux-en-Y surg.    There were no vitals filed for this visit.  Subjective Assessment - 04/15/19 1216    Subjective  Pt reports she thinks she is doing a little bit better, still has soreness in the Rt axilla and pec area.    Patient Stated Goals  have less pain, normal motions    Currently in Pain?  Yes    Pain Score  4     Pain Location  Axilla    Pain Orientation  Right    Pain Descriptors / Indicators  Sore    Pain Type  Surgical pain    Pain Onset  1 to 4 weeks ago    Pain Frequency  Constant    Aggravating Factors   moving    Pain Relieving Factors  ice, rest and meds         [facility] PT Assessment - 04/15/19 0001      PROM   Right Shoulder Flexion  120 Degrees   supine   Right Shoulder External Rotation  52 Degrees   arm abducted ~ 30 degrees in supine                   [facility] Adult PT Treatment/Exercise - 04/15/19 0001      Exercises   Exercises  Shoulder      Shoulder Exercises: Supine   Other Supine Exercises  Rt shoulder press with submax holds 10x5sec, cues for form       Shoulder Exercises: Seated   Other Seated Exercises  10 reps BWD shoulder rolls, bilat and alternating     Other Seated Exercises  bicep curls Rt through full ROM and  then top half followed, by bottom half       Modalities   Modalities  Vasopneumatic;Electrical Stimulation      Electrical Stimulation   Electrical Stimulation  Location  right shoulder    Electrical Stimulation Action  IFC    Electrical Stimulation Parameters   to tolerance in sitting    Electrical Stimulation Goals  Pain      Vasopneumatic   Number Minutes Vasopneumatic   15 minutes    Vasopnuematic Location   Shoulder    Vasopneumatic Pressure  Low    Vasopneumatic Temperature   40      Manual Therapy   Manual Therapy  Passive ROM;Soft tissue mobilization    Soft tissue mobilization  STM to Rt pecs and deltoid    Passive ROM  flexion, scaption, ER, and IR, no abduction, gentle shoulder distraction to relax and during the motions               PT Short Term Goals - 04/13/19 1734      PT SHORT TERM GOAL #1   Title  independent with initial HEP    Status  Achieved        PT Long Term Goals - 04/02/19 1719      PT LONG TERM GOAL #1   Title  Patient will demonstrate proper technique with her home exercise program to improve shoulder ROM.    Time  8    Period  Weeks    Status  New      PT LONG TERM GOAL #2   Title  increase AROM of the right shoulder flexion to 150 degrees    Time  12    Period  Weeks    Status  New      PT LONG TERM GOAL #3   Title  report no difficulty with dressing or doing hair    Time  12    Period  Weeks    Status  New      PT LONG TERM GOAL #4   Title  decrease pain 50%    Time  12    Period  Weeks    Status  New      PT LONG TERM GOAL #5   Title  increase strength of the right shoulder to 4/5    Time  12    Period  Weeks    Status  New            Plan - 04/15/19 1254    Clinical Impression Statement  Pt was able to relax more for PROM of the Rt shoulder.  She was very sensitive to light STM to Rt pecs,deltoid and upper trap.  Her PROM has improved per measurements, she is on track per protocol.  Anticipate need for  scapular stabilization as she progresses into the protocol due to postural changes that will place stress on shoulder joint.  Decreased pressure was utilized on vaso today per pt request.    Rehab Potential  Good    PT Frequency  2x / week    PT Duration  8 weeks    PT Treatment/Interventions  ADLs/Self Care Home Management;Cryotherapy;Electrical Stimulation;Therapeutic activities;Therapeutic exercise;Manual techniques;Patient/family education    PT Next Visit Plan  PROM only, no abduction       Patient will benefit from skilled therapeutic intervention in order to improve the following deficits and impairments:  Improper body mechanics, Pain, Increased muscle spasms, Postural dysfunction, Impaired UE functional use, Decreased strength, Decreased range of motion, Increased edema  Visit Diagnosis: Stiffness of right shoulder, not elsewhere classified  Localized edema  Acute pain of right shoulder     Problem List Patient  Active Problem List   Diagnosis Date Noted  . Class 2 severe obesity with serious comorbidity and body mass index (BMI) of 37.0 to 37.9 in adult [facility]) 01/15/2019  . Status post gastric surgery 07/29/2018  . Prediabetes 03/06/2018  . Vitamin D deficiency 02/18/2018  . Chemotherapy-induced peripheral neuropathy ([place]) 02/15/2017  . Port catheter in place 11/02/2016  . Genetic testing 09/19/2016  . Pre-operative clearance 07/23/2016  . Malignant neoplasm of lower-outer quadrant of left breast of female, estrogen receptor positive ([place]) 07/10/2016  . Essential hypertension   . Chest pain 09/30/2015  . DVT, lower extremity ([place]) 06/18/2011  . DVT of upper extremity (deep vein thrombosis) ([place]) 06/18/2011  . [place] filter in place 06/18/2011  . History of stroke 06/18/2011  . PFO (patent foramen ovale) 06/18/2011  . Status post gastric bypass for obesity 06/18/2011  . PELVIC PAIN, CHRONIC 07/20/2008  . FIBROIDS, UTERUS 07/16/2008  . ASTHMA 07/16/2008  .  FIBROMYALGIA 07/16/2008  . CARPAL TUNNEL SYNDROME, HX OF 07/16/2008  . History of pulmonary embolus (PE) 07/16/2008    Jeral Pinch PT  04/15/2019, 12:59 PM  [address] [place], [name], [address] Phone: [phone]   Fax:  [phone]  Name: [name] MRN: [id number] Date of Birth: 1961/03/14

## 2019-04-20 ENCOUNTER — Ambulatory Visit: Payer: Medicare Other | Admitting: Physical Therapy

## 2019-04-20 ENCOUNTER — Other Ambulatory Visit: Payer: Self-pay

## 2019-04-20 ENCOUNTER — Encounter: Payer: Self-pay | Admitting: Physical Therapy

## 2019-04-20 DIAGNOSIS — M25611 Stiffness of right shoulder, not elsewhere classified: Secondary | ICD-10-CM | POA: Diagnosis not present

## 2019-04-20 DIAGNOSIS — M25511 Pain in right shoulder: Secondary | ICD-10-CM

## 2019-04-20 DIAGNOSIS — R600 Localized edema: Secondary | ICD-10-CM | POA: Diagnosis not present

## 2019-04-20 DIAGNOSIS — M810 Age-related osteoporosis without current pathological fracture: Secondary | ICD-10-CM | POA: Diagnosis not present

## 2019-04-20 DIAGNOSIS — I10 Essential (primary) hypertension: Secondary | ICD-10-CM | POA: Diagnosis not present

## 2019-04-20 DIAGNOSIS — D649 Anemia, unspecified: Secondary | ICD-10-CM | POA: Diagnosis not present

## 2019-04-20 DIAGNOSIS — E7800 Pure hypercholesterolemia, unspecified: Secondary | ICD-10-CM | POA: Diagnosis not present

## 2019-04-20 NOTE — Therapy (Signed)
[place] [place], [name], [address] Phone: [phone]   Fax:  [phone]  Physical Therapy Treatment  Patient Details  Name: [name] MRN: [id number] Date of Birth: September 11, 1960 Referring Provider (PT): [name] Date: 04/20/2019  PT End of Session - 04/20/19 1138    Visit Number  6    Date for PT Re-Evaluation  06/02/19    PT Start Time  3329    PT Stop Time  1224    PT Time Calculation (min)  48 min    Activity Tolerance  Patient tolerated treatment well    Behavior During Therapy  [facility] for tasks assessed/performed       Past Medical History:  Diagnosis Date  . Anemia    since bypass  . Arthritis    osteoarthritis  . Asthma    states no asthma attack since 2002  . Back pain   . Breast cancer ([place]) 06/26/16 bx   left breast  . Breast cancer ([place])   . Chronic back pain   . Complication of anesthesia    states takes more than normal to put her to sleep  . Constipation   . Dental bridge present    upper  . Dental crowns present   . DVT of upper extremity (deep vein thrombosis) ([place])   . Fibromyalgia   . Genetic testing 09/19/2016   Ms. Benavides underwent genetic counseling and testing for hereditary cancer syndromes on 08/21/2016. Her results were negative for mutations in all 46 genes analyzed by [employer]'s 46-gene Common Hereditary Cancers Panel. Genes analyzed include: APC, ATM, AXIN2, BARD1, BMPR1A, BRCA1, BRCA2, BRIP1, CDH1, CDKN2A, CHEK2, CTNNA1, DICER1, EPCAM, GREM1, HOXB13, KIT, MEN1, MLH1, MSH2, MSH3, MSH6, MUTYH, NBN,   . Headache(784.0)    migraines  . History of blood transfusion 06/2005  . History of gallstones   . History of pneumonia   . History of shingles 07/2011  . History of shingles   . HTN (hypertension)   . Itching   . Joint pain   . Muscle weakness   . Neuropathy   . Normal coronary arteries 2003  . Osteoarthropathy   . Personal history of  chemotherapy 2018  . Personal history of radiation therapy 2018  . PFO (patent foramen ovale)   . Presence of inferior vena cava filter   . Pulmonary embolism ([place])   . Sleep apnea    used CPAP until after bypass surg.  . Status post gastric bypass for obesity   . Stomach pain   . Stroke [facility]) 12/2002   right-sided weakness  . Urinary incontinence     Past Surgical History:  Procedure Laterality Date  . ABDOMINAL HYSTERECTOMY  11/1997   complete  . ANAL RECTAL MANOMETRY N/A 01/21/2019   Procedure: ANO RECTAL MANOMETRY;  Surgeon: Arta Silence, MD;  [facility];  Service: Endoscopy;  Laterality: N/A;  . ANTERIOR CERVICAL DECOMP/DISCECTOMY FUSION  02/05/2005   C5-6  . APPENDECTOMY  10/22/2008   laparoscopic  . BREAST LUMPECTOMY Left 07/2016  . BREAST LUMPECTOMY WITH RADIOACTIVE SEED AND SENTINEL LYMPH NODE BIOPSY Left 07/26/2016   Procedure: BREAST LUMPECTOMY WITH RADIOACTIVE SEED AND SENTINEL LYMPH NODE BIOPSY;  Surgeon: Erroll Luna, MD;  Location: [place];  Service: General;  Laterality: Left;  . BUNIONECTOMY  05/1980   both feet  . BUNIONECTOMY  08/2011   left foot  . CARDIAC CATHETERIZATION  03/04/2002  . CARPAL  TUNNEL RELEASE  06/21/2009   right  . CARPAL TUNNEL RELEASE     left hand  . CARPAL TUNNEL RELEASE  10/03/2011   Procedure: CARPAL TUNNEL RELEASE;  Surgeon: Wynonia Sours, MD;  Location: [place];  Service: Orthopedics;  Laterality: Right;  CARPAL TUNNEL WITH HYPOTHENAR FAT PAD TRANSFER  . CERVICAL SPINE SURGERY  01/2005   titanium plate implanted  . CHOLECYSTECTOMY  1990  . ELBOW SURGERY  08/09/2004   decompression ulnar nerve right elbow  . ENTEROLYSIS  10/22/2008   laparoscopic abd. enterolysis  . GASTRIC ROUX-EN-Y  2009  . HEEL SPUR SURGERY  08/1997   left  . HEMORRHOID SURGERY  03/1993  . LAPAROSCOPIC LYSIS INTESTINAL ADHESIONS  02/14/2000  . NAILBED REPAIR  01/10/2005; 08/2011   exc. matrix bilat. great toe  . OTHER  SURGICAL HISTORY  12/1986   pt states that she had surgery to unclog her fallopean tubes  . PORTACATH PLACEMENT N/A 09/24/2016   Procedure: INSERTION PORT-A-CATH WITH [name];  Surgeon: Erroll Luna, MD;  Location: [place];  Service: General;  Laterality: N/A;  . SHOULDER SURGERY     bilat. - (left:  06/2005)  . TONSILLECTOMY  07/1995  . TRIGGER FINGER RELEASE  04/25/2006   decompression A-1 pulley left thumb  . TRIGGER FINGER RELEASE Right 11/11/2018   Procedure: RELEASE TRIGGER FINGER/A-1 PULLEY RIGHT THUMB, RIGHT RING FINGER;  Surgeon: Daryll Brod, MD;  Location: [place];  Service: Orthopedics;  Laterality: Right;  . UTERINE FIBROID SURGERY  12/95, 7/96   x2  . VENA CAVA FILTER PLACEMENT  2009   during Roux-en-Y surg.    There were no vitals filed for this visit.  Subjective Assessment - 04/20/19 1137    Currently in Pain?  Yes    Pain Score  4     Pain Location  Shoulder    Pain Orientation  Anterior    Pain Descriptors / Indicators  Sore    Pain Type  Surgical pain    Pain Onset  More than a month ago    Pain Frequency  Constant    Aggravating Factors   nithing stays constant    Pain Relieving Factors  ice, rest and medication         [facility] PT Assessment - 04/20/19 0001      Assessment   Medical Diagnosis  s/p right shoulder RC repair, and SAD    Referring Provider (PT)  [place]      PROM   Right Shoulder Flexion  140 Degrees    Right Shoulder External Rotation  50 Degrees                   [facility] Adult PT Treatment/Exercise - 04/20/19 0001      Shoulder Exercises: Seated   Row  Strengthening;Both;10 reps   10 sec isometric holds scap retrac   Flexion  PROM;Right;10 reps    Flexion Limitations  ball rolls FWD with left arm doing the work.     Other Seated Exercises  10 reps BWD shoulder rolls, bilat and alternating     Other Seated Exercises  cervical rotation and side bend      Shoulder Exercises: Prone   Retraction   AROM;Strengthening;Right   3x10, arm off EOB, cues to use periscapular muscles only   Retraction Limitations  arm off EOB and arm by side      Shoulder Exercises: Isometric Strengthening   Extension  5X5"  arm at side   External Rotation  5X5"   arm at side   Internal Rotation  5X5"   arm at side     Modalities   Modalities  Vasopneumatic      Acupuncturist Location  --    Printmaker Action  --    Electrical Stimulation Parameters  --    Electrical Stimulation Goals  --      Vasopneumatic   Number Minutes Vasopneumatic   15 minutes    Vasopnuematic Location   Shoulder    Vasopneumatic Pressure  Low    Vasopneumatic Temperature   40      Manual Therapy   Manual Therapy  Passive ROM;Joint mobilization    Joint Mobilization  accessory motions Rt GH Joint    Passive ROM  Rt shoulder flex, IR, ER                PT Short Term Goals - 04/13/19 1734      PT SHORT TERM GOAL #1   Title  independent with initial HEP    Status  Achieved        PT Long Term Goals - 04/02/19 1719      PT LONG TERM GOAL #1   Title  Patient will demonstrate proper technique with her home exercise program to improve shoulder ROM.    Time  8    Period  Weeks    Status  New      PT LONG TERM GOAL #2   Title  increase AROM of the right shoulder flexion to 150 degrees    Time  12    Period  Weeks    Status  New      PT LONG TERM GOAL #3   Title  report no difficulty with dressing or doing hair    Time  12    Period  Weeks    Status  New      PT LONG TERM GOAL #4   Title  decrease pain 50%    Time  12    Period  Weeks    Status  New      PT LONG TERM GOAL #5   Title  increase strength of the right shoulder to 4/5    Time  12    Period  Weeks    Status  New            Plan - 04/20/19 1212    Clinical Impression Statement  PROM in Rt shoulder continues to improve.  She was able to perform initial scapular stabilization  today with VC for form and to keep humerous still.  Pain was less at end of session so stim was held and only vaso used to decrease risk of post tx edema.  Doing well per protocol    Rehab Potential  Good    PT Frequency  2x / week    PT Duration  8 weeks    PT Treatment/Interventions  ADLs/Self Care Home Management;Cryotherapy;Electrical Stimulation;Therapeutic activities;Therapeutic exercise;Manual techniques;Patient/family education    PT Next Visit Plan  PROM only, no abduction    Consulted and Agree with Plan of Care  Patient       Patient will benefit from skilled therapeutic intervention in order to improve the following deficits and impairments:  Improper body mechanics, Pain, Increased muscle spasms, Postural dysfunction, Impaired UE functional use, Decreased strength, Decreased range of motion, Increased edema  Visit Diagnosis: Stiffness of  right shoulder, not elsewhere classified  Localized edema  Acute pain of right shoulder     Problem List Patient Active Problem List   Diagnosis Date Noted  . Class 2 severe obesity with serious comorbidity and body mass index (BMI) of 37.0 to 37.9 in adult [facility]) 01/15/2019  . Status post gastric surgery 07/29/2018  . Prediabetes 03/06/2018  . Vitamin D deficiency 02/18/2018  . Chemotherapy-induced peripheral neuropathy ([place]) 02/15/2017  . Port catheter in place 11/02/2016  . Genetic testing 09/19/2016  . Pre-operative clearance 07/23/2016  . Malignant neoplasm of lower-outer quadrant of left breast of female, estrogen receptor positive ([place]) 07/10/2016  . Essential hypertension   . Chest pain 09/30/2015  . DVT, lower extremity ([place]) 06/18/2011  . DVT of upper extremity (deep vein thrombosis) ([place]) 06/18/2011  . [place] filter in place 06/18/2011  . History of stroke 06/18/2011  . PFO (patent foramen ovale) 06/18/2011  . Status post gastric bypass for obesity 06/18/2011  . PELVIC PAIN, CHRONIC 07/20/2008  . FIBROIDS, UTERUS  07/16/2008  . ASTHMA 07/16/2008  . FIBROMYALGIA 07/16/2008  . CARPAL TUNNEL SYNDROME, HX OF 07/16/2008  . History of pulmonary embolus (PE) 07/16/2008    Jeral Pinch PT  04/20/2019, 12:15 PM  [address] [place], [name], [address] Phone: [phone]   Fax:  [phone]  Name: [name] MRN: [id number] Date of Birth: [birth year]

## 2019-04-21 DIAGNOSIS — I10 Essential (primary) hypertension: Secondary | ICD-10-CM | POA: Diagnosis not present

## 2019-04-21 DIAGNOSIS — Z6841 Body Mass Index (BMI) 40.0 and over, adult: Secondary | ICD-10-CM | POA: Diagnosis not present

## 2019-04-21 DIAGNOSIS — M545 Low back pain: Secondary | ICD-10-CM | POA: Diagnosis not present

## 2019-04-21 DIAGNOSIS — M47816 Spondylosis without myelopathy or radiculopathy, lumbar region: Secondary | ICD-10-CM | POA: Diagnosis not present

## 2019-04-22 ENCOUNTER — Encounter: Payer: Medicare Other | Admitting: Physical Therapy

## 2019-04-22 ENCOUNTER — Other Ambulatory Visit: Payer: Self-pay

## 2019-04-22 ENCOUNTER — Ambulatory Visit (INDEPENDENT_AMBULATORY_CARE_PROVIDER_SITE_OTHER): Payer: Medicare Other | Admitting: Bariatrics

## 2019-04-22 ENCOUNTER — Encounter (INDEPENDENT_AMBULATORY_CARE_PROVIDER_SITE_OTHER): Payer: Self-pay | Admitting: Bariatrics

## 2019-04-22 VITALS — BP 143/72 | HR 63 | Temp 98.0°F | Ht 61.0 in | Wt 198.0 lb

## 2019-04-22 DIAGNOSIS — E8881 Metabolic syndrome: Secondary | ICD-10-CM | POA: Diagnosis not present

## 2019-04-22 DIAGNOSIS — E559 Vitamin D deficiency, unspecified: Secondary | ICD-10-CM | POA: Diagnosis not present

## 2019-04-22 DIAGNOSIS — Z6837 Body mass index (BMI) 37.0-37.9, adult: Secondary | ICD-10-CM

## 2019-04-22 DIAGNOSIS — Z9189 Other specified personal risk factors, not elsewhere classified: Secondary | ICD-10-CM | POA: Diagnosis not present

## 2019-04-22 MED ORDER — VITAMIN D3 1.25 MG (50000 UT) PO CAPS: 1.0000 | ORAL_CAPSULE | ORAL | 0 refills | Status: DC

## 2019-04-22 MED FILL — VIT D3-50 50,000 UNITS CAPS: 1.25 MG | 56 days supply | Qty: 4 | Fill #0

## 2019-04-22 NOTE — Progress Notes (Signed)
Office: [phone]  /  Fax: [phone]   HPI:   Chief Complaint: OBESITY [name] is here to discuss her progress with her obesity treatment plan. She is on the Category 2 plan and is following her eating plan approximately 60% of the time. She states she is exercising 0 minutes 0 times per week. [name] is down 1 lb. She has completed shoulder surgery and is doing more soup. She states that she works on getting in her protein.  Her weight is 198 lb (89.8 kg) today and has had a weight loss of 1 pound over a period of 4 weeks since her last visit. She has lost 6 lbs since starting treatment with [name].  Vitamin D deficiency [name] has a diagnosis of Vitamin D deficiency. Last Vitamin D 17.3 on 12/17/2018. She is currently taking prescription Vit D and denies nausea, vomiting or muscle weakness. She reports limited sunshine exposure.  At risk for osteopenia and osteoporosis [name] is at higher risk of osteopenia and osteoporosis due to Vitamin D deficiency.   Insulin Resistance [name] has a diagnosis of insulin resistance based on her elevated fasting insulin level >5. Last A1c 5.6 on 12/17/2018. Although [name]'s blood glucose readings are still under good control, insulin resistance puts her at greater risk of metabolic syndrome and diabetes. She is on no medications currently and continues to work on diet and exercise to decrease risk of diabetes.  ASSESSMENT AND PLAN:  Vitamin D deficiency - Plan: Cholecalciferol (VITAMIN D3) 1.25 MG (50000 UT) CAPS  Insulin resistance  At risk for osteoporosis  Class 2 severe obesity due to excess calories with serious comorbidity and body mass index (BMI) of 37.0 to 37.9 in adult [facility])  PLAN:  Vitamin D Deficiency [name] was informed that low Vitamin D levels contributes to fatigue and are associated with obesity, breast, and colon cancer. She agrees to continue to take prescription Vit D @ 50,000 IU every 14 days #4 with 0  refills and will follow-up for routine testing of Vitamin D, at least 2-3 times per year. She was informed of the risk of over-replacement of Vitamin D and agrees to not increase her dose unless she discusses this with [name] first. [name] agrees to follow-up with our clinic in 4 weeks.  At risk for osteopenia and osteoporosis [name] was given extended  (15 minutes) osteoporosis prevention counseling today. [name] is at risk for osteopenia and osteoporosis due to her Vitamin D deficiency. She was encouraged to take her Vitamin D and follow her higher calcium diet and increase strengthening exercise to help strengthen her bones and decrease her risk of osteopenia and osteoporosis.  Insulin Resistance [name] will continue to work on weight loss, exercise, and decreasing simple carbohydrates in her diet to help decrease the risk of diabetes. We dicussed metformin including benefits and risks. She was informed that eating too many simple carbohydrates or too many calories at one sitting increases the likelihood of GI side effects. [name] will decrease carbohydrates and increase protein and healthy fats.  Obesity [name] is currently in the action stage of change. As such, her goal is to continue with weight loss efforts. She has agreed to follow the Category 2 plan. [name] will work on meal planning, decreasing sugar and carbs, and will increase her water intake. [name] is going to PT for her right shoulder. She will start back pedal exercises. We discussed the following Behavioral Modification Strategies today: increasing lean protein intake, decreasing simple carbohydrates, increasing vegetables, increase H20 intake,  decrease eating out, no skipping meals, work on meal planning and easy cooking plans, and keeping healthy foods in the home.  [name] has agreed to follow-up with our clinic in 4 weeks. She was informed of the importance of frequent follow-up visits to maximize her success  with intensive lifestyle modifications for her multiple health conditions.  ALLERGIES: Allergies  Allergen Reactions  . Aspirin Other (See Comments)    ESOPHAGITIS  . Oxycodone Hcl Diarrhea and Nausea And Vomiting  . Propoxyphene N-Acetaminophen Diarrhea and Nausea And Vomiting  . Tramadol Other (See Comments)    Cause migraines  . Adhesive [Tape] Rash and Other (See Comments)    Pulls skin off - please use paper tape  . Prednisone Rash    MEDICATIONS: Current Outpatient Medications on File Prior to Visit  Medication Sig Dispense Refill  . albuterol (PROAIR HFA) 108 (90 Base) MCG/ACT inhaler Inhale 2 puffs into the lungs every 6 (six) hours as needed for wheezing or shortness of breath.    Marland Kitchen aspirin EC 81 MG tablet Take 81 mg by mouth daily.    . Calcium Citrate-Vitamin D (CALCIUM CITRATE +D PO) Take 2 tablets by mouth daily. Calcium 600 mg     . hydrOXYzine (ATARAX/VISTARIL) 25 MG tablet Take 25 mg by mouth 3 (three) times daily as needed.    Marland Kitchen letrozole (FEMARA) 2.5 MG tablet TAKE 1 TABLET (2.5 MG TOTAL) BY MOUTH DAILY. 90 tablet 1  . linaclotide (LINZESS) 290 MCG CAPS capsule Take 290 mcg by mouth daily before breakfast.    . Methocarbamol (ROBAXIN-750 PO) Robaxin    . mometasone (ELOCON) 0.1 % cream Apply 1 application topically daily as needed (rash - summer eczema). Apply to arms    . Multiple Vitamin (MULTIVITAMIN WITH MINERALS) TABS tablet Take 1 tablet by mouth at bedtime.    . silver sulfADIAZINE (SILVADENE) 1 % cream Apply 1 application topically 2 (two) times daily as needed (stomach tears). 50 g 0  . solifenacin (VESICARE) 10 MG tablet     . valACYclovir (VALTREX) 1000 MG tablet Take 1 tablet (1,000 mg total) by mouth 3 (three) times daily. 21 tablet 0   No current facility-administered medications on file prior to visit.     PAST MEDICAL HISTORY: Past Medical History:  Diagnosis Date  . Anemia    since bypass  . Arthritis    osteoarthritis  . Asthma    states  no asthma attack since 2002  . Back pain   . Breast cancer ([place]) 06/26/16 bx   left breast  . Breast cancer ([place])   . Chronic back pain   . Complication of anesthesia    states takes more than normal to put her to sleep  . Constipation   . Dental bridge present    upper  . Dental crowns present   . DVT of upper extremity (deep vein thrombosis) ([place])   . Fibromyalgia   . Genetic testing 09/19/2016   Ms. Heng underwent genetic counseling and testing for hereditary cancer syndromes on 08/21/2016. Her results were negative for mutations in all 46 genes analyzed by [facility]'s 46-gene Common Hereditary Cancers Panel. Genes analyzed include: APC, ATM, AXIN2, BARD1, BMPR1A, BRCA1, BRCA2, BRIP1, CDH1, CDKN2A, CHEK2, CTNNA1, DICER1, EPCAM, GREM1, HOXB13, KIT, MEN1, MLH1, MSH2, MSH3, MSH6, MUTYH, NBN,   . Headache(784.0)    migraines  . History of blood transfusion 06/2005  . History of gallstones   . History of pneumonia   . History of shingles 07/2011  .  History of shingles   . HTN (hypertension)   . Itching   . Joint pain   . Muscle weakness   . Neuropathy   . Normal coronary arteries 2003  . Osteoarthropathy   . Personal history of chemotherapy 2018  . Personal history of radiation therapy 2018  . PFO (patent foramen ovale)   . Presence of inferior vena cava filter   . Pulmonary embolism ([place])   . Sleep apnea    used CPAP until after bypass surg.  . Status post gastric bypass for obesity   . Stomach pain   . Stroke [facility]) 12/2002   right-sided weakness  . Urinary incontinence     PAST SURGICAL HISTORY: Past Surgical History:  Procedure Laterality Date  . ABDOMINAL HYSTERECTOMY  11/1997   complete  . ANAL RECTAL MANOMETRY N/A 01/21/2019   Procedure: ANO RECTAL MANOMETRY;  Surgeon: Arta Silence, MD;  Location: [facility];  Service: Endoscopy;  Laterality: N/A;  . ANTERIOR CERVICAL DECOMP/DISCECTOMY FUSION  02/05/2005   C5-6  . APPENDECTOMY  10/22/2008   laparoscopic  . BREAST  LUMPECTOMY Left 07/2016  . BREAST LUMPECTOMY WITH RADIOACTIVE SEED AND SENTINEL LYMPH NODE BIOPSY Left 07/26/2016   Procedure: BREAST LUMPECTOMY WITH RADIOACTIVE SEED AND SENTINEL LYMPH NODE BIOPSY;  Surgeon: Erroll Luna, MD;  Location: [place];  Service: General;  Laterality: Left;  . BUNIONECTOMY  05/1980   both feet  . BUNIONECTOMY  08/2011   left foot  . CARDIAC CATHETERIZATION  03/04/2002  . CARPAL TUNNEL RELEASE  06/21/2009   right  . CARPAL TUNNEL RELEASE     left hand  . CARPAL TUNNEL RELEASE  10/03/2011   Procedure: CARPAL TUNNEL RELEASE;  Surgeon: Wynonia Sours, MD;  Location: [place];  Service: Orthopedics;  Laterality: Right;  CARPAL TUNNEL WITH HYPOTHENAR FAT PAD TRANSFER  . CERVICAL SPINE SURGERY  01/2005   titanium plate implanted  . CHOLECYSTECTOMY  1990  . ELBOW SURGERY  08/09/2004   decompression ulnar nerve right elbow  . ENTEROLYSIS  10/22/2008   laparoscopic abd. enterolysis  . GASTRIC ROUX-EN-Y  2009  . HEEL SPUR SURGERY  08/1997   left  . HEMORRHOID SURGERY  03/1993  . LAPAROSCOPIC LYSIS INTESTINAL ADHESIONS  02/14/2000  . NAILBED REPAIR  01/10/2005; 08/2011   exc. matrix bilat. great toe  . OTHER SURGICAL HISTORY  12/1986   pt states that she had surgery to unclog her fallopean tubes  . PORTACATH PLACEMENT N/A 09/24/2016   Procedure: INSERTION PORT-A-CATH WITH [name];  Surgeon: Erroll Luna, MD;  Location: [place];  Service: General;  Laterality: N/A;  . SHOULDER SURGERY     bilat. - (left:  06/2005)  . TONSILLECTOMY  07/1995  . TRIGGER FINGER RELEASE  04/25/2006   decompression A-1 pulley left thumb  . TRIGGER FINGER RELEASE Right 11/11/2018   Procedure: RELEASE TRIGGER FINGER/A-1 PULLEY RIGHT THUMB, RIGHT RING FINGER;  Surgeon: Daryll Brod, MD;  Location: [place];  Service: Orthopedics;  Laterality: Right;  . UTERINE FIBROID SURGERY  12/95, 7/96   x2  . VENA CAVA FILTER PLACEMENT  2009   during Roux-en-Y surg.     SOCIAL HISTORY: Social History   Tobacco Use  . Smoking status: Former Research scientist (life sciences)  . Smokeless tobacco: Never Used  . Tobacco comment: quit smoking 08/1989  Substance Use Topics  . Alcohol use: No  . Drug use: No    FAMILY HISTORY: Family History  Problem Relation Age of  Onset  . Breast cancer Paternal Aunt 65  . Cervical cancer Paternal Grandmother 19       d.40s  . Ovarian cancer Maternal Grandmother 23       d.23  . Breast cancer Maternal Grandmother   . Colon polyps Father   . Diabetes Father        borderline  . Prostate cancer Father   . High blood pressure Father   . Hypertension Mother   . Breast cancer Mother   . Hypertension Other   . Breast cancer Sister 76       treated with neoadjuvant chemo/radiation and lumpectomy  . Breast cancer Sister    ROS: Review of Systems  Gastrointestinal: Negative for nausea and vomiting.  Musculoskeletal:       Negative for muscle weakness.   PHYSICAL EXAM: Blood pressure (!) 143/72, pulse 63, temperature 98 F (36.7 C), height 5' 1"  (1.549 m), weight 198 lb (89.8 kg), SpO2 99 %. Body mass index is 37.41 kg/m. Physical Exam Vitals signs reviewed.  Constitutional:      Appearance: Normal appearance. She is obese.  Cardiovascular:     Rate and Rhythm: Normal rate.     Pulses: Normal pulses.  Pulmonary:     Effort: Pulmonary effort is normal.     Breath sounds: Normal breath sounds.  Musculoskeletal: Normal range of motion.     Comments: Right arm is in sling down to her hand secondary to right shoulder surgery.  Skin:    General: Skin is warm and dry.  Neurological:     Mental Status: She is alert and oriented to person, place, and time.  Psychiatric:        Behavior: Behavior normal.   RECENT LABS AND TESTS: BMET    Component Value Date/Time   NA 140 12/17/2018 1539   NA 138 04/19/2017 1005   K 4.0 12/17/2018 1539   K 4.0 04/19/2017 1005   CL 102 12/17/2018 1539   CO2 20 12/17/2018 1539   CO2 25 04/19/2017  1005   GLUCOSE 65 12/17/2018 1539   GLUCOSE 80 11/26/2017 1544   GLUCOSE 87 04/19/2017 1005   BUN 14 12/17/2018 1539   BUN 12.0 04/19/2017 1005   CREATININE 0.68 12/17/2018 1539   CREATININE 0.77 11/26/2017 1544   CREATININE 0.7 04/19/2017 1005   CALCIUM 9.1 12/17/2018 1539   CALCIUM 8.9 04/19/2017 1005   GFRNONAA 98 12/17/2018 1539   GFRNONAA >60 11/26/2017 1544   GFRAA 113 12/17/2018 1539   GFRAA >60 11/26/2017 1544   Lab Results  Component Value Date   HGBA1C 5.6 12/17/2018   HGBA1C 5.7 (H) 06/10/2018   HGBA1C 5.8 (H) 02/18/2018   Lab Results  Component Value Date   INSULIN 4.9 02/18/2018   CBC    Component Value Date/Time   WBC 4.3 11/26/2017 1544   WBC 3.5 (L) 04/19/2017 1005   WBC 7.5 12/14/2016 0120   RBC 3.62 (L) 11/26/2017 1544   HGB 11.5 (L) 11/26/2017 1544   HGB 10.2 (L) 04/19/2017 1005   HCT 34.8 11/26/2017 1544   HCT 31.1 (L) 04/19/2017 1005   PLT 192 11/26/2017 1544   PLT 184 04/19/2017 1005   MCV 96.1 11/26/2017 1544   MCV 97.5 04/19/2017 1005   MCH 31.8 11/26/2017 1544   MCHC 33.0 11/26/2017 1544   RDW 14.5 11/26/2017 1544   RDW 15.3 (H) 04/19/2017 1005   LYMPHSABS 1.3 11/26/2017 1544   LYMPHSABS 1.0 04/19/2017 1005   MONOABS 0.4  11/26/2017 1544   MONOABS 0.3 04/19/2017 1005   EOSABS 0.1 11/26/2017 1544   EOSABS 0.1 04/19/2017 1005   BASOSABS 0.0 11/26/2017 1544   BASOSABS 0.0 04/19/2017 1005   Iron/TIBC/Ferritin/ %Sat No results found for: IRON, TIBC, FERRITIN, IRONPCTSAT Lipid Panel     Component Value Date/Time   CHOL 210 (H) 11/07/2018 0914   TRIG 61 11/07/2018 0914   HDL 84 11/07/2018 0914   CHOLHDL 2.5 11/07/2018 0914   CHOLHDL 2.6 09/30/2015 1838   VLDL 12 09/30/2015 1838   LDLCALC 114 (H) 11/07/2018 0914   Hepatic Function Panel     Component Value Date/Time   PROT 7.5 12/17/2018 1539   PROT 6.7 04/19/2017 1005   ALBUMIN 4.8 12/17/2018 1539   ALBUMIN 3.9 04/19/2017 1005   AST 16 12/17/2018 1539   AST 17 11/26/2017 1544    AST 14 04/19/2017 1005   ALT 14 12/17/2018 1539   ALT 14 11/26/2017 1544   ALT 11 04/19/2017 1005   ALKPHOS 102 12/17/2018 1539   ALKPHOS 50 04/19/2017 1005   BILITOT 0.5 12/17/2018 1539   BILITOT 0.4 11/26/2017 1544   BILITOT 0.31 04/19/2017 1005      Component Value Date/Time   TSH 0.802 02/18/2018 1029   Results for VARSHINI, ARRANTS (MRN [id number]) as of 04/22/2019 09:54  Ref. Range 12/17/2018 15:39  Vitamin D, 25-Hydroxy Latest Ref Range: 30.0 - 100.0 ng/mL 17.3 (L)   OBESITY BEHAVIORAL INTERVENTION VISIT  Today's visit was #17   Starting weight: 204 lbs Starting date: 02/18/2018 Today's weight: 198 lbs   Today's date: 04/22/2019 Total lbs lost to date: 6     04/22/2019  Height 5' 1"  (1.549 m)  Weight 198 lb (89.8 kg)  BMI (Calculated) 37.43  BLOOD PRESSURE - SYSTOLIC 696  BLOOD PRESSURE - DIASTOLIC 72   Body Fat % 29.5 %   ASK: We discussed the diagnosis of obesity with [name] today and [name] agreed to give [name] permission to discuss obesity behavioral modification therapy today.  ASSESS: [name] has the diagnosis of obesity and her BMI today is 37.4. [name] is in the action stage of change.   ADVISE: [name] was educated on the multiple health risks of obesity as well as the benefit of weight loss to improve her health. She was advised of the need for long term treatment and the importance of lifestyle modifications to improve her current health and to decrease her risk of future health problems.  AGREE: Multiple dietary modification options and treatment options were discussed and  Raniyah agreed to follow the recommendations documented in the above note.  ARRANGE: [name] was educated on the importance of frequent visits to treat obesity as outlined per CMS and USPSTF guidelines and agreed to schedule her next follow up appointment today.  Migdalia Dk, am acting as Location manager for [employer], DO   I have reviewed the  above documentation for accuracy and completeness, and I agree with the above. -Jearld Lesch, DO

## 2019-04-29 ENCOUNTER — Ambulatory Visit: Payer: Medicare Other | Attending: Orthopedic Surgery | Admitting: Physical Therapy

## 2019-04-29 ENCOUNTER — Encounter: Payer: Self-pay | Admitting: Physical Therapy

## 2019-04-29 ENCOUNTER — Other Ambulatory Visit: Payer: Self-pay

## 2019-04-29 DIAGNOSIS — M25511 Pain in right shoulder: Secondary | ICD-10-CM | POA: Insufficient documentation

## 2019-04-29 DIAGNOSIS — M25611 Stiffness of right shoulder, not elsewhere classified: Secondary | ICD-10-CM | POA: Diagnosis not present

## 2019-04-29 DIAGNOSIS — R600 Localized edema: Secondary | ICD-10-CM | POA: Insufficient documentation

## 2019-04-29 NOTE — Therapy (Signed)
[place], [name], [address] Phone: [phone]   Fax:  [phone]  Physical Therapy Treatment  Patient Details  Name: [name] MRN: [id number] Date of Birth: [birth year] Referring Provider (PT): [name] Date: 04/29/2019  PT End of Session - 04/29/19 1058    Visit Number  7    Date for PT Re-Evaluation  06/02/19    PT Start Time  1013    PT Stop Time  1114    PT Time Calculation (min)  61 min    Activity Tolerance  Patient tolerated treatment well    Behavior During Therapy  [facility] for tasks assessed/performed       Past Medical History:  Diagnosis Date  . Anemia    since bypass  . Arthritis    osteoarthritis  . Asthma    states no asthma attack since 2002  . Back pain   . Breast cancer ([place]) 06/26/16 bx   left breast  . Breast cancer ([place])   . Chronic back pain   . Complication of anesthesia    states takes more than normal to put her to sleep  . Constipation   . Dental bridge present    upper  . Dental crowns present   . DVT of upper extremity (deep vein thrombosis) ([place])   . Fibromyalgia   . Genetic testing 09/19/2016   Ms. Deol underwent genetic counseling and testing for hereditary cancer syndromes on 08/21/2016. Her results were negative for mutations in all 46 genes analyzed by [employer]'s 46-gene Common Hereditary Cancers Panel. Genes analyzed include: APC, ATM, AXIN2, BARD1, BMPR1A, BRCA1, BRCA2, BRIP1, CDH1, CDKN2A, CHEK2, CTNNA1, DICER1, EPCAM, GREM1, HOXB13, KIT, MEN1, MLH1, MSH2, MSH3, MSH6, MUTYH, NBN,   . Headache(784.0)    migraines  . History of blood transfusion 06/2005  . History of gallstones   . History of pneumonia   . History of shingles 07/2011  . History of shingles   . HTN (hypertension)   . Itching   . Joint pain   . Muscle weakness   . Neuropathy   . Normal coronary arteries 2003  . Osteoarthropathy   . Personal history of  chemotherapy 2018  . Personal history of radiation therapy 2018  . PFO (patent foramen ovale)   . Presence of inferior vena cava filter   . Pulmonary embolism ([place])   . Sleep apnea    used CPAP until after bypass surg.  . Status post gastric bypass for obesity   . Stomach pain   . Stroke [facility]) 12/2002   right-sided weakness  . Urinary incontinence     Past Surgical History:  Procedure Laterality Date  . ABDOMINAL HYSTERECTOMY  11/1997   complete  . ANAL RECTAL MANOMETRY N/A 01/21/2019   Procedure: ANO RECTAL MANOMETRY;  Surgeon: Arta Silence, MD;  [facility];  Service: Endoscopy;  Laterality: N/A;  . ANTERIOR CERVICAL DECOMP/DISCECTOMY FUSION  02/05/2005   C5-6  . APPENDECTOMY  10/22/2008   laparoscopic  . BREAST LUMPECTOMY Left 07/2016  . BREAST LUMPECTOMY WITH RADIOACTIVE SEED AND SENTINEL LYMPH NODE BIOPSY Left 07/26/2016   Procedure: BREAST LUMPECTOMY WITH RADIOACTIVE SEED AND SENTINEL LYMPH NODE BIOPSY;  Surgeon: Erroll Luna, MD;  Location: [place];  Service: General;  Laterality: Left;  . BUNIONECTOMY  05/1980   both feet  . BUNIONECTOMY  08/2011   left foot  . CARDIAC CATHETERIZATION  03/04/2002  . CARPAL  TUNNEL RELEASE  06/21/2009   right  . CARPAL TUNNEL RELEASE     left hand  . CARPAL TUNNEL RELEASE  10/03/2011   Procedure: CARPAL TUNNEL RELEASE;  Surgeon: Wynonia Sours, MD;  Location: [place];  Service: Orthopedics;  Laterality: Right;  CARPAL TUNNEL WITH HYPOTHENAR FAT PAD TRANSFER  . CERVICAL SPINE SURGERY  01/2005   titanium plate implanted  . CHOLECYSTECTOMY  1990  . ELBOW SURGERY  08/09/2004   decompression ulnar nerve right elbow  . ENTEROLYSIS  10/22/2008   laparoscopic abd. enterolysis  . GASTRIC ROUX-EN-Y  2009  . HEEL SPUR SURGERY  08/1997   left  . HEMORRHOID SURGERY  03/1993  . LAPAROSCOPIC LYSIS INTESTINAL ADHESIONS  02/14/2000  . NAILBED REPAIR  01/10/2005; 08/2011   exc. matrix bilat. great toe  . OTHER  SURGICAL HISTORY  12/1986   pt states that she had surgery to unclog her fallopean tubes  . PORTACATH PLACEMENT N/A 09/24/2016   Procedure: INSERTION PORT-A-CATH WITH [name];  Surgeon: Erroll Luna, MD;  Location: [place];  Service: General;  Laterality: N/A;  . SHOULDER SURGERY     bilat. - (left:  06/2005)  . TONSILLECTOMY  07/1995  . TRIGGER FINGER RELEASE  04/25/2006   decompression A-1 pulley left thumb  . TRIGGER FINGER RELEASE Right 11/11/2018   Procedure: RELEASE TRIGGER FINGER/A-1 PULLEY RIGHT THUMB, RIGHT RING FINGER;  Surgeon: Daryll Brod, MD;  Location: [place];  Service: Orthopedics;  Laterality: Right;  . UTERINE FIBROID SURGERY  12/95, 7/96   x2  . VENA CAVA FILTER PLACEMENT  2009   during Roux-en-Y surg.    There were no vitals filed for this visit.  Subjective Assessment - 04/29/19 1020    Subjective  Patient reports that she has been aching and is "tired of the sling"  She reports that she is doing her exercises but feels like the arm is getting tight, has a high rating of pain at rest    Currently in Pain?  Yes    Pain Score  6     Pain Location  Shoulder    Pain Orientation  Right    Aggravating Factors   constant pain    Pain Relieving Factors  ice, pain meds the estim helps some         [facility] PT Assessment - 04/29/19 0001      PROM   Right Shoulder Flexion  140 Degrees    Right Shoulder Internal Rotation  42 Degrees    Right Shoulder External Rotation  55 Degrees                   [facility] Adult PT Treatment/Exercise - 04/29/19 0001      Shoulder Exercises: Seated   Other Seated Exercises  hnd on cane in sitting for flexion      Shoulder Exercises: Standing   Internal Rotation  AAROM;15 reps    Internal Rotation Limitations  with wand behind back    Extension  AAROM;Both;15 reps    Other Standing Exercises  biceps with wand    Other Standing Exercises  ball on mat table rolling with assist, small circles iwth assst       Shoulder Exercises: ROM/Strengthening   UBE (Upper Arm Bike)  level 1 x 4 minutes      Shoulder Exercises: Isometric Strengthening   ADduction  5X10"    ADduction Limitations  pillow under arm      Electrical  Stimulation   Electrical Stimulation Location  right shoulder    Electrical Stimulation Action  IFC    Electrical Stimulation Parameters  sitting    Electrical Stimulation Goals  Pain      Vasopneumatic   Number Minutes Vasopneumatic   15 minutes    Vasopnuematic Location   Shoulder    Vasopneumatic Pressure  Low    Vasopneumatic Temperature   40      Manual Therapy   Manual Therapy  Passive ROM;Joint mobilization    Passive ROM  Rt shoulder flex, IR, ER              PT Education - 04/29/19 1058    Education Details  added isometric extension and adduction    Person(s) Educated  Patient    Methods  Explanation;Demonstration;Tactile cues;Verbal cues;Handout    Comprehension  Verbalized understanding;Returned demonstration;Verbal cues required;Tactile cues required       PT Short Term Goals - 04/13/19 1734      PT SHORT TERM GOAL #1   Title  independent with initial HEP    Status  Achieved        PT Long Term Goals - 04/29/19 1101      PT LONG TERM GOAL #1   Title  Patient will demonstrate proper technique with her home exercise program to improve shoulder ROM.    Status  Partially Met      PT LONG TERM GOAL #2   Title  increase AROM of the right shoulder flexion to 150 degrees    Status  On-going      PT LONG TERM GOAL #3   Title  report no difficulty with dressing or doing hair    Status  On-going      PT LONG TERM GOAL #4   Title  decrease pain 50%    Status  On-going            Plan - 04/29/19 1059    Clinical Impression Statement  Patient continues to have a lot of pain, she is worried about her ROM.  However with PROM today she did a good job of relaxing and allowing the ROM.  I had her try to do some increased AAROM today and this  seemed to help her move better.    PT Next Visit Plan  no abduction, slowly work on [employer] and Agree with Plan of Care  Patient       Patient will benefit from skilled therapeutic intervention in order to improve the following deficits and impairments:  Improper body mechanics, Pain, Increased muscle spasms, Postural dysfunction, Impaired UE functional use, Decreased strength, Decreased range of motion, Increased edema  Visit Diagnosis: Stiffness of right shoulder, not elsewhere classified  Localized edema  Acute pain of right shoulder     Problem List Patient Active Problem List   Diagnosis Date Noted  . Class 2 severe obesity with serious comorbidity and body mass index (BMI) of 37.0 to 37.9 in adult [facility]) 01/15/2019  . Status post gastric surgery 07/29/2018  . Prediabetes 03/06/2018  . Vitamin D deficiency 02/18/2018  . Chemotherapy-induced peripheral neuropathy ([place]) 02/15/2017  . Port catheter in place 11/02/2016  . Genetic testing 09/19/2016  . Pre-operative clearance 07/23/2016  . Malignant neoplasm of lower-outer quadrant of left breast of female, estrogen receptor positive ([place]) 07/10/2016  . Essential hypertension   . Chest pain 09/30/2015  . DVT, lower extremity ([place]) 06/18/2011  . DVT of upper extremity (deep  vein thrombosis) ([place]) 06/18/2011  . [place] filter in place 06/18/2011  . History of stroke 06/18/2011  . PFO (patent foramen ovale) 06/18/2011  . Status post gastric bypass for obesity 06/18/2011  . PELVIC PAIN, CHRONIC 07/20/2008  . FIBROIDS, UTERUS 07/16/2008  . ASTHMA 07/16/2008  . FIBROMYALGIA 07/16/2008  . CARPAL TUNNEL SYNDROME, HX OF 07/16/2008  . History of pulmonary embolus (PE) 07/16/2008    Sumner Boast., PT 04/29/2019, 11:02 AM  [address] [place], [name], [address] Phone: [phone]   Fax:  [phone]  Name: [name] MRN: [id number] Date of Birth: 1960/11/19

## 2019-05-01 ENCOUNTER — Ambulatory Visit: Payer: Medicare Other | Admitting: Physical Therapy

## 2019-05-01 ENCOUNTER — Encounter: Payer: Self-pay | Admitting: Physical Therapy

## 2019-05-01 ENCOUNTER — Other Ambulatory Visit: Payer: Self-pay

## 2019-05-01 DIAGNOSIS — M25511 Pain in right shoulder: Secondary | ICD-10-CM

## 2019-05-01 DIAGNOSIS — R600 Localized edema: Secondary | ICD-10-CM

## 2019-05-01 DIAGNOSIS — M25611 Stiffness of right shoulder, not elsewhere classified: Secondary | ICD-10-CM | POA: Diagnosis not present

## 2019-05-01 NOTE — Therapy (Signed)
[place], [name], [address] Phone: [phone]   Fax:  [phone]  Physical Therapy Treatment  Patient Details  Name: [name] MRN: [id number] Date of Birth: 1960-09-27 Referring Provider (PT): [name] Date: 05/01/2019  PT End of Session - 05/01/19 1049    Visit Number  8    Date for PT Re-Evaluation  06/02/19    PT Start Time  4076    PT Stop Time  1111    PT Time Calculation (min)  57 min    Activity Tolerance  Patient tolerated treatment well    Behavior During Therapy  [facility] for tasks assessed/performed       Past Medical History:  Diagnosis Date  . Anemia    since bypass  . Arthritis    osteoarthritis  . Asthma    states no asthma attack since 2002  . Back pain   . Breast cancer ([place]) 06/26/16 bx   left breast  . Breast cancer ([place])   . Chronic back pain   . Complication of anesthesia    states takes more than normal to put her to sleep  . Constipation   . Dental bridge present    upper  . Dental crowns present   . DVT of upper extremity (deep vein thrombosis) ([place])   . Fibromyalgia   . Genetic testing 09/19/2016   Ms. Absher underwent genetic counseling and testing for hereditary cancer syndromes on 08/21/2016. Her results were negative for mutations in all 46 genes analyzed by [employer]'s 46-gene Common Hereditary Cancers Panel. Genes analyzed include: APC, ATM, AXIN2, BARD1, BMPR1A, BRCA1, BRCA2, BRIP1, CDH1, CDKN2A, CHEK2, CTNNA1, DICER1, EPCAM, GREM1, HOXB13, KIT, MEN1, MLH1, MSH2, MSH3, MSH6, MUTYH, NBN,   . Headache(784.0)    migraines  . History of blood transfusion 06/2005  . History of gallstones   . History of pneumonia   . History of shingles 07/2011  . History of shingles   . HTN (hypertension)   . Itching   . Joint pain   . Muscle weakness   . Neuropathy   . Normal coronary arteries 2003  . Osteoarthropathy   . Personal history of  chemotherapy 2018  . Personal history of radiation therapy 2018  . PFO (patent foramen ovale)   . Presence of inferior vena cava filter   . Pulmonary embolism ([place])   . Sleep apnea    used CPAP until after bypass surg.  . Status post gastric bypass for obesity   . Stomach pain   . Stroke [facility]) 12/2002   right-sided weakness  . Urinary incontinence     Past Surgical History:  Procedure Laterality Date  . ABDOMINAL HYSTERECTOMY  11/1997   complete  . ANAL RECTAL MANOMETRY N/A 01/21/2019   Procedure: ANO RECTAL MANOMETRY;  Surgeon: Arta Silence, MD;  [facility];  Service: Endoscopy;  Laterality: N/A;  . ANTERIOR CERVICAL DECOMP/DISCECTOMY FUSION  02/05/2005   C5-6  . APPENDECTOMY  10/22/2008   laparoscopic  . BREAST LUMPECTOMY Left 07/2016  . BREAST LUMPECTOMY WITH RADIOACTIVE SEED AND SENTINEL LYMPH NODE BIOPSY Left 07/26/2016   Procedure: BREAST LUMPECTOMY WITH RADIOACTIVE SEED AND SENTINEL LYMPH NODE BIOPSY;  Surgeon: Erroll Luna, MD;  Location: [place];  Service: General;  Laterality: Left;  . BUNIONECTOMY  05/1980   both feet  . BUNIONECTOMY  08/2011   left foot  . CARDIAC CATHETERIZATION  03/04/2002  . CARPAL  TUNNEL RELEASE  06/21/2009   right  . CARPAL TUNNEL RELEASE     left hand  . CARPAL TUNNEL RELEASE  10/03/2011   Procedure: CARPAL TUNNEL RELEASE;  Surgeon: Wynonia Sours, MD;  Location: [place];  Service: Orthopedics;  Laterality: Right;  CARPAL TUNNEL WITH HYPOTHENAR FAT PAD TRANSFER  . CERVICAL SPINE SURGERY  01/2005   titanium plate implanted  . CHOLECYSTECTOMY  1990  . ELBOW SURGERY  08/09/2004   decompression ulnar nerve right elbow  . ENTEROLYSIS  10/22/2008   laparoscopic abd. enterolysis  . GASTRIC ROUX-EN-Y  2009  . HEEL SPUR SURGERY  08/1997   left  . HEMORRHOID SURGERY  03/1993  . LAPAROSCOPIC LYSIS INTESTINAL ADHESIONS  02/14/2000  . NAILBED REPAIR  01/10/2005; 08/2011   exc. matrix bilat. great toe  . OTHER  SURGICAL HISTORY  12/1986   pt states that she had surgery to unclog her fallopean tubes  . PORTACATH PLACEMENT N/A 09/24/2016   Procedure: INSERTION PORT-A-CATH WITH [name];  Surgeon: Erroll Luna, MD;  Location: [place];  Service: General;  Laterality: N/A;  . SHOULDER SURGERY     bilat. - (left:  06/2005)  . TONSILLECTOMY  07/1995  . TRIGGER FINGER RELEASE  04/25/2006   decompression A-1 pulley left thumb  . TRIGGER FINGER RELEASE Right 11/11/2018   Procedure: RELEASE TRIGGER FINGER/A-1 PULLEY RIGHT THUMB, RIGHT RING FINGER;  Surgeon: Daryll Brod, MD;  Location: [place];  Service: Orthopedics;  Laterality: Right;  . UTERINE FIBROID SURGERY  12/95, 7/96   x2  . VENA CAVA FILTER PLACEMENT  2009   during Roux-en-Y surg.    There were no vitals filed for this visit.  Subjective Assessment - 05/01/19 1017    Subjective  Patient reports that she was pretty sore, but less stiffness    Currently in Pain?  Yes    Pain Score  4     Pain Location  Shoulder    Pain Orientation  Right                       [facility] Adult PT Treatment/Exercise - 05/01/19 0001      Shoulder Exercises: Standing   External Rotation  AAROM;Right;20 reps    External Rotation Limitations  with wand    Internal Rotation  AAROM;15 reps    Internal Rotation Limitations  with wand behind back    Extension  AAROM;Both;15 reps    Extension Limitations  with wand    Row  Both;20 reps;Theraband    Theraband Level (Shoulder Row)  Level 1 (Yellow)    Row Limitations  arms at side    Other Standing Exercises  biceps with wand    Other Standing Exercises  ball on mat table rolling with assist, small circles iwth assst      Shoulder Exercises: ROM/Strengthening   UBE (Upper Arm Bike)  level 1 x 4 minutes      Shoulder Exercises: Isometric Strengthening   Extension  5X10"    ADduction  5X10"    ADduction Limitations  pillow under arm      Electrical Stimulation   Electrical Stimulation  Location  right shoulder    Electrical Stimulation Action  IFC    Electrical Stimulation Parameters  sitting    Electrical Stimulation Goals  Pain      Vasopneumatic   Number Minutes Vasopneumatic   15 minutes    Vasopnuematic Location   Shoulder  Vasopneumatic Pressure  Low    Vasopneumatic Temperature   40      Manual Therapy   Manual Therapy  Passive ROM;Joint mobilization    Soft tissue mobilization  to right upper trap, has a knot here    Passive ROM  Rt shoulder flex, IR, ER                PT Short Term Goals - 04/13/19 1734      PT SHORT TERM GOAL #1   Title  independent with initial HEP    Status  Achieved        PT Long Term Goals - 04/29/19 1101      PT LONG TERM GOAL #1   Title  Patient will demonstrate proper technique with her home exercise program to improve shoulder ROM.    Status  Partially Met      PT LONG TERM GOAL #2   Title  increase AROM of the right shoulder flexion to 150 degrees    Status  On-going      PT LONG TERM GOAL #3   Title  report no difficulty with dressing or doing hair    Status  On-going      PT LONG TERM GOAL #4   Title  decrease pain 50%    Status  On-going            Plan - 05/01/19 1050    Clinical Impression Statement  Pattient seems to have a little less pain and a little better motions, still requires cues to not elevate the shoulder and to relax the arm.  With cues she does better with allowing true PROM    PT Next Visit Plan  slowly progressing with some gentle AAROM    Consulted and Agree with Plan of Care  Patient       Patient will benefit from skilled therapeutic intervention in order to improve the following deficits and impairments:  Improper body mechanics, Pain, Increased muscle spasms, Postural dysfunction, Impaired UE functional use, Decreased strength, Decreased range of motion, Increased edema  Visit Diagnosis: Stiffness of right shoulder, not elsewhere classified  Localized  edema  Acute pain of right shoulder     Problem List Patient Active Problem List   Diagnosis Date Noted  . Class 2 severe obesity with serious comorbidity and body mass index (BMI) of 37.0 to 37.9 in adult [facility]) 01/15/2019  . Status post gastric surgery 07/29/2018  . Prediabetes 03/06/2018  . Vitamin D deficiency 02/18/2018  . Chemotherapy-induced peripheral neuropathy ([place]) 02/15/2017  . Port catheter in place 11/02/2016  . Genetic testing 09/19/2016  . Pre-operative clearance 07/23/2016  . 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 Essential hypertension   . Chest pain 09/30/2015  . DVT, lower extremity ([place]) 06/18/2011  . DVT of upper extremity (deep vein thrombosis) ([place]) 06/18/2011  . Greenfield filter in place 06/18/2011  . History of stroke 06/18/2011  . PFO (patent foramen ovale) 06/18/2011  . Status post gastric bypass for obesity 06/18/2011  . PELVIC PAIN, CHRONIC 07/20/2008  . FIBROIDS, UTERUS 07/16/2008  . ASTHMA 07/16/2008  . FIBROMYALGIA 07/16/2008  . CARPAL TUNNEL SYNDROME, HX OF 07/16/2008  . History of pulmonary embolus ([place]) 07/16/2008    Sumner Boast., PT 05/01/2019, 10:52 AM  [address] [place], [name], [address] Phone: [phone]   Fax:  [phone]  Name:  [name] MRN: [id number] Date of Birth: 18-Feb-1961

## 2019-05-06 ENCOUNTER — Encounter: Payer: Medicare Other | Admitting: Physical Therapy

## 2019-05-08 ENCOUNTER — Ambulatory Visit: Payer: Medicare Other

## 2019-05-11 ENCOUNTER — Encounter: Payer: Self-pay | Admitting: Physical Therapy

## 2019-05-11 ENCOUNTER — Other Ambulatory Visit: Payer: Self-pay

## 2019-05-11 ENCOUNTER — Ambulatory Visit: Payer: Medicare Other | Admitting: Physical Therapy

## 2019-05-11 DIAGNOSIS — M25512 Pain in left shoulder: Secondary | ICD-10-CM | POA: Diagnosis not present

## 2019-05-11 DIAGNOSIS — R600 Localized edema: Secondary | ICD-10-CM | POA: Diagnosis not present

## 2019-05-11 DIAGNOSIS — M25511 Pain in right shoulder: Secondary | ICD-10-CM

## 2019-05-11 DIAGNOSIS — Z4789 Encounter for other orthopedic aftercare: Secondary | ICD-10-CM | POA: Diagnosis not present

## 2019-05-11 DIAGNOSIS — M25611 Stiffness of right shoulder, not elsewhere classified: Secondary | ICD-10-CM | POA: Diagnosis not present

## 2019-05-11 NOTE — Therapy (Signed)
[address] [place], [name], [address] Phone: [phone]   Fax:  [phone]  Physical Therapy Treatment  Patient Details  Name: [name] MRN: [id number] Date of Birth: 09/06/1960 Referring Provider (PT): [name] Date: 05/11/2019  PT End of Session - 05/11/19 1522    Visit Number  9    Date for PT Re-Evaluation  06/02/19    PT Start Time  6160    PT Stop Time  1535    PT Time Calculation (min)  57 min    Activity Tolerance  Patient tolerated treatment well    Behavior During Therapy  [facility] for tasks assessed/performed       Past Medical History:  Diagnosis Date  . Anemia    since bypass  . Arthritis    osteoarthritis  . Asthma    states no asthma attack since 2002  . Back pain   . Breast cancer ([place]) 06/26/16 bx   left breast  . Breast cancer ([place])   . Chronic back pain   . Complication of anesthesia    states takes more than normal to put her to sleep  . Constipation   . Dental bridge present    upper  . Dental crowns present   . DVT of upper extremity (deep vein thrombosis) ([place])   . Fibromyalgia   . Genetic testing 09/19/2016   Ms. Mabile underwent genetic counseling and testing for hereditary cancer syndromes on 08/21/2016. Her results were negative for mutations in all 46 genes analyzed by [employer]'s 46-gene Common Hereditary Cancers Panel. Genes analyzed include: APC, ATM, AXIN2, BARD1, BMPR1A, BRCA1, BRCA2, BRIP1, CDH1, CDKN2A, CHEK2, CTNNA1, DICER1, EPCAM, GREM1, HOXB13, KIT, MEN1, MLH1, MSH2, MSH3, MSH6, MUTYH, NBN,   . Headache(784.0)    migraines  . History of blood transfusion 06/2005  . History of gallstones   . History of pneumonia   . History of shingles 07/2011  . History of shingles   . HTN (hypertension)   . Itching   . Joint pain   . Muscle weakness   . Neuropathy   . Normal coronary arteries 2003  . Osteoarthropathy   . Personal history of  chemotherapy 2018  . Personal history of radiation therapy 2018  . PFO (patent foramen ovale)   . Presence of inferior vena cava filter   . Pulmonary embolism ([place])   . Sleep apnea    used CPAP until after bypass surg.  . Status post gastric bypass for obesity   . Stomach pain   . Stroke [facility]) 12/2002   right-sided weakness  . Urinary incontinence     Past Surgical History:  Procedure Laterality Date  . ABDOMINAL HYSTERECTOMY  11/1997   complete  . ANAL RECTAL MANOMETRY N/A 01/21/2019   Procedure: ANO RECTAL MANOMETRY;  Surgeon: Arta Silence, MD;  Location[facility];  Service: Endoscopy;  Laterality: N/A;  . ANTERIOR CERVICAL DECOMP/DISCECTOMY FUSION  02/05/2005   C5-6  . APPENDECTOMY  10/22/2008   laparoscopic  . BREAST LUMPECTOMY Left 07/2016  . BREAST LUMPECTOMY WITH RADIOACTIVE SEED AND SENTINEL LYMPH NODE BIOPSY Left 07/26/2016   Procedure: BREAST LUMPECTOMY WITH RADIOACTIVE SEED AND SENTINEL LYMPH NODE BIOPSY;  Surgeon: Erroll Luna, MD;  Location: [place];  Service: General;  Laterality: Left;  . BUNIONECTOMY  05/1980   both feet  . BUNIONECTOMY  08/2011   left foot  . CARDIAC CATHETERIZATION  03/04/2002  . CARPAL  TUNNEL RELEASE  06/21/2009   right  . CARPAL TUNNEL RELEASE     left hand  . CARPAL TUNNEL RELEASE  10/03/2011   Procedure: CARPAL TUNNEL RELEASE;  Surgeon: Wynonia Sours, MD;  Location: [place];  Service: Orthopedics;  Laterality: Right;  CARPAL TUNNEL WITH HYPOTHENAR FAT PAD TRANSFER  . CERVICAL SPINE SURGERY  01/2005   titanium plate implanted  . CHOLECYSTECTOMY  1990  . ELBOW SURGERY  08/09/2004   decompression ulnar nerve right elbow  . ENTEROLYSIS  10/22/2008   laparoscopic abd. enterolysis  . GASTRIC ROUX-EN-Y  2009  . HEEL SPUR SURGERY  08/1997   left  . HEMORRHOID SURGERY  03/1993  . LAPAROSCOPIC LYSIS INTESTINAL ADHESIONS  02/14/2000  . NAILBED REPAIR  01/10/2005; 08/2011   exc. matrix bilat. great toe  . OTHER  SURGICAL HISTORY  12/1986   pt states that she had surgery to unclog her fallopean tubes  . PORTACATH PLACEMENT N/A 09/24/2016   Procedure: INSERTION PORT-A-CATH WITH [name];  Surgeon: Erroll Luna, MD;  Location: [place];  Service: General;  Laterality: N/A;  . SHOULDER SURGERY     bilat. - (left:  06/2005)  . TONSILLECTOMY  07/1995  . TRIGGER FINGER RELEASE  04/25/2006   decompression A-1 [name] left thumb  . TRIGGER FINGER RELEASE Right 11/11/2018   Procedure: RELEASE TRIGGER FINGER/A-1 [name] RIGHT THUMB, RIGHT RING FINGER;  Surgeon: Daryll Brod, MD;  Location: [place];  Service: Orthopedics;  Laterality: Right;  . UTERINE FIBROID SURGERY  12/95, 7/96   x2  . VENA CAVA FILTER PLACEMENT  2009   during Roux-en-Y surg.    There were no vitals filed for this visit.  Subjective Assessment - 05/11/19 1444    Subjective  Today is 6 weeks post op, saw MD today, pleased withg progress, she reports both shoulders hurt    Currently in Pain?  Yes    Pain Score  4     Pain Location  Shoulder    Pain Orientation  Right;Left    Aggravating Factors   just hurting                       [facility] Adult PT Treatment/Exercise - 05/11/19 0001      Shoulder Exercises: Supine   External Rotation  AROM;Right;20 reps    Internal Rotation  Right;AROM;20 reps    Other Supine Exercises  chest press with wand.       Shoulder Exercises: Standing   External Rotation  AAROM;Right;20 reps    External Rotation Limitations  with wand    Internal Rotation  AAROM;20 reps    Internal Rotation Limitations  with wand behind back    Extension  AAROM;Both;20 reps    Extension Limitations  with wand    Row  Both;20 reps;Theraband    Theraband Level (Shoulder Row)  Level 2 (Red)    Row Limitations  arms at side    Other Standing Exercises  biceps with wand    Other Standing Exercises  finger ladder      Shoulder Exercises: ROM/Strengthening   UBE (Upper Arm Bike)  level 2 x 4 minutes     Wall Wash  flexion both hands and then small cirles CW/CCW at chest high      Shoulder Exercises: IT sales professional  3 reps;10 seconds    Corner Stretch Limitations  in Statistician  Electrical Stimulation Location  right upper trap and neck area    Electrical Stimulation Action  IFC    Electrical Stimulation Parameters  sitting    Electrical Stimulation Goals  Pain               PT Short Term Goals - 04/13/19 1734      PT SHORT TERM GOAL #1   Title  independent with initial HEP    Status  Achieved        PT Long Term Goals - 05/11/19 1524      PT LONG TERM GOAL #1   Title  Patient will demonstrate proper technique with her home exercise program to improve shoulder ROM.    Status  Partially Met      PT LONG TERM GOAL #2   Title  increase AROM of the right shoulder flexion to 150 degrees    Status  On-going            Plan - 05/11/19 1523    Clinical Impression Statement  Patient is at 6 weeks today, started some AAROM and AROM, some in supine and some standing, slowly working on ROM, needs cues as she tends to elevate the shoulder, she is having some spasms in the upper trap and the neck as well.    PT Next Visit Plan  Week 6 starts today    Consulted and Agree with Plan of Care  Patient       Patient will benefit from skilled therapeutic intervention in order to improve the following deficits and impairments:  Improper body mechanics, Pain, Increased muscle spasms, Postural dysfunction, Impaired UE functional use, Decreased strength, Decreased range of motion, Increased edema  Visit Diagnosis: Stiffness of right shoulder, not elsewhere classified  Localized edema  Acute pain of right shoulder     Problem List Patient Active Problem List   Diagnosis Date Noted  . Class 2 severe obesity with serious comorbidity and body mass index (BMI) of 37.0 to 37.9 in adult [facility]) 01/15/2019  . Status post gastric surgery  07/29/2018  . Prediabetes 03/06/2018  . Vitamin D deficiency 02/18/2018  . Chemotherapy-induced peripheral neuropathy ([place]) 02/15/2017  . Port catheter in place 11/02/2016  . Genetic testing 09/19/2016  . Pre-operative clearance 07/23/2016  . Malignant neoplasm of lower-outer quadrant of left breast of female, estrogen receptor positive ([place]) 07/10/2016  . Essential hypertension   . Chest pain 09/30/2015  . DVT, lower extremity ([place]) 06/18/2011  . DVT of upper extremity (deep vein thrombosis) ([place]) 06/18/2011  . [place] filter in place 06/18/2011  . History of stroke 06/18/2011  . PFO (patent foramen ovale) 06/18/2011  . Status post gastric bypass for obesity 06/18/2011  . PELVIC PAIN, CHRONIC 07/20/2008  . FIBROIDS, UTERUS 07/16/2008  . ASTHMA 07/16/2008  . FIBROMYALGIA 07/16/2008  . CARPAL TUNNEL SYNDROME, HX OF 07/16/2008  . History of pulmonary embolus (PE) 07/16/2008    Sumner Boast., PT 05/11/2019, 3:24 PM  [address] [place], [name], [address] Phone: [phone]   Fax:  [phone]  Name: [name] MRN: [id number] Date of Birth: [birth year]

## 2019-05-13 ENCOUNTER — Other Ambulatory Visit: Payer: Self-pay

## 2019-05-13 ENCOUNTER — Encounter: Payer: Self-pay | Admitting: Physical Therapy

## 2019-05-13 ENCOUNTER — Ambulatory Visit: Payer: Medicare Other | Admitting: Physical Therapy

## 2019-05-13 DIAGNOSIS — M25611 Stiffness of right shoulder, not elsewhere classified: Secondary | ICD-10-CM | POA: Diagnosis not present

## 2019-05-13 DIAGNOSIS — R600 Localized edema: Secondary | ICD-10-CM | POA: Diagnosis not present

## 2019-05-13 DIAGNOSIS — M25511 Pain in right shoulder: Secondary | ICD-10-CM

## 2019-05-13 MED FILL — SSD 1% CREAM: 1 | 30 days supply | Qty: 400 | Fill #1

## 2019-05-13 MED FILL — SOLIFENACIN SUCCINATE 10 MG: 10 | 30 days supply | Qty: 30 | Fill #0

## 2019-05-13 MED FILL — hydrOXYzine HCL 10 MG TABS: 10 | 30 days supply | Qty: 30 | Fill #0

## 2019-05-13 MED FILL — METHOCARBAMOL 750 MG TABS: 750 | 30 days supply | Qty: 60 | Fill #1

## 2019-05-13 NOTE — Therapy (Signed)
[address] [place], [name], [address] Phone: [phone]   Fax:  [phone] Progress Note Reporting Period 04/02/19 to 05/13/19 for the first 10 visits  See note below for Objective Data and Assessment of Progress/Goals.      Physical Therapy Evaluation  Patient Details  Name: [name] MRN: [id number] Date of Birth: 1960-11-28 Referring Provider (PT): [name] Date: 05/13/2019  PT End of Session - 05/13/19 1057    Visit Number  10    Date for PT Re-Evaluation  06/02/19    PT Start Time  7741    PT Stop Time  1113    PT Time Calculation (min)  58 min    Activity Tolerance  Patient tolerated treatment well    Behavior During Therapy  [facility] for tasks assessed/performed       Past Medical History:  Diagnosis Date  . Anemia    since bypass  . Arthritis    osteoarthritis  . Asthma    states no asthma attack since 2002  . Back pain   . Breast cancer ([place]) 06/26/16 bx   left breast  . Breast cancer ([place])   . Chronic back pain   . Complication of anesthesia    states takes more than normal to put her to sleep  . Constipation   . Dental bridge present    upper  . Dental crowns present   . DVT of upper extremity (deep vein thrombosis) ([place])   . Fibromyalgia   . Genetic testing 09/19/2016   Ms. Chartrand underwent genetic counseling and testing for hereditary cancer syndromes on 08/21/2016. Her results were negative for mutations in all 46 genes analyzed by [employer]'s 46-gene Common Hereditary Cancers Panel. Genes analyzed include: APC, ATM, AXIN2, BARD1, BMPR1A, BRCA1, BRCA2, BRIP1, CDH1, CDKN2A, CHEK2, CTNNA1, DICER1, EPCAM, GREM1, HOXB13, KIT, MEN1, MLH1, MSH2, MSH3, MSH6, MUTYH, NBN,   . Headache(784.0)    migraines  . History of blood transfusion 06/2005  . History of gallstones   . History of pneumonia   . History of shingles 07/2011  . History of shingles   . HTN  (hypertension)   . Itching   . Joint pain   . Muscle weakness   . Neuropathy   . Normal coronary arteries 2003  . Osteoarthropathy   . Personal history of chemotherapy 2018  . Personal history of radiation therapy 2018  . PFO (patent foramen ovale)   . Presence of inferior vena cava filter   . Pulmonary embolism ([place])   . Sleep apnea    used CPAP until after bypass surg.  . Status post gastric bypass for obesity   . Stomach pain   . Stroke [facility]) 12/2002   right-sided weakness  . Urinary incontinence     Past Surgical History:  Procedure Laterality Date  . ABDOMINAL HYSTERECTOMY  11/1997   complete  . ANAL RECTAL MANOMETRY N/A 01/21/2019   Procedure: ANO RECTAL MANOMETRY;  Surgeon: Arta Silence, MD;  [facility];  Service: Endoscopy;  Laterality: N/A;  . ANTERIOR CERVICAL DECOMP/DISCECTOMY FUSION  02/05/2005   C5-6  . APPENDECTOMY  10/22/2008   laparoscopic  . BREAST LUMPECTOMY Left 07/2016  . BREAST LUMPECTOMY WITH RADIOACTIVE SEED AND SENTINEL LYMPH NODE BIOPSY Left 07/26/2016   Procedure: BREAST LUMPECTOMY WITH RADIOACTIVE SEED AND SENTINEL LYMPH NODE BIOPSY;  Surgeon: Erroll Luna, MD;  Location: [place];  Service: General;  Laterality: Left;  .  BUNIONECTOMY  05/1980   both feet  . BUNIONECTOMY  08/2011   left foot  . CARDIAC CATHETERIZATION  03/04/2002  . CARPAL TUNNEL RELEASE  06/21/2009   right  . CARPAL TUNNEL RELEASE     left hand  . CARPAL TUNNEL RELEASE  10/03/2011   Procedure: CARPAL TUNNEL RELEASE;  Surgeon: Wynonia Sours, MD;  Location: [place];  Service: Orthopedics;  Laterality: Right;  CARPAL TUNNEL WITH HYPOTHENAR FAT PAD TRANSFER  . CERVICAL SPINE SURGERY  01/2005   titanium plate implanted  . CHOLECYSTECTOMY  1990  . ELBOW SURGERY  08/09/2004   decompression ulnar nerve right elbow  . ENTEROLYSIS  10/22/2008   laparoscopic abd. enterolysis  . GASTRIC ROUX-EN-Y  2009  . HEEL SPUR SURGERY  08/1997   left  .  HEMORRHOID SURGERY  03/1993  . LAPAROSCOPIC LYSIS INTESTINAL ADHESIONS  02/14/2000  . NAILBED REPAIR  01/10/2005; 08/2011   exc. matrix bilat. great toe  . OTHER SURGICAL HISTORY  12/1986   pt states that she had surgery to unclog her fallopean tubes  . PORTACATH PLACEMENT N/A 09/24/2016   Procedure: INSERTION PORT-A-CATH WITH [name];  Surgeon: Erroll Luna, MD;  Location: [place];  Service: General;  Laterality: N/A;  . SHOULDER SURGERY     bilat. - (left:  06/2005)  . TONSILLECTOMY  07/1995  . TRIGGER FINGER RELEASE  04/25/2006   decompression A-1 pulley left thumb  . TRIGGER FINGER RELEASE Right 11/11/2018   Procedure: RELEASE TRIGGER FINGER/A-1 PULLEY RIGHT THUMB, RIGHT RING FINGER;  Surgeon: Daryll Brod, MD;  Location: [place];  Service: Orthopedics;  Laterality: Right;  . UTERINE FIBROID SURGERY  12/95, 7/96   x2  . VENA CAVA FILTER PLACEMENT  2009   during Roux-en-Y surg.    There were no vitals filed for this visit.   Subjective Assessment - 05/13/19 1019    Subjective  Patient reports that she was a little sore after the exercises we added    Currently in Pain?  Yes    Pain Score  3     Pain Location  Shoulder    Pain Orientation  Right                    Objective measurements completed on examination: See above findings.      [facility] Adult PT Treatment/Exercise - 05/13/19 0001      Shoulder Exercises: Supine   Other Supine Exercises  chest press with wand.       Shoulder Exercises: Standing   External Rotation  AAROM;Right;20 reps    External Rotation Limitations  with wand    Internal Rotation  AAROM;20 reps    Internal Rotation Limitations  with wand behind back    Flexion  AAROM;Both;20 reps    Flexion Limitations  with wand sliding up the door frame    Extension  AAROM;Both;20 reps    Extension Limitations  with wand and PT overpressure to get more ROM    Row  Both;20 reps;Theraband    Theraband Level (Shoulder Row)  Level 2 (Red)     Row Limitations  arms at side    Other Standing Exercises  biceps with wand, 2# biceps    Other Standing Exercises  finger ladder, 3# shrugs      Shoulder Exercises: ROM/Strengthening   UBE (Upper Arm Bike)  level 2 x 5 minutes    Wall Wash  flexion both hands and then small cirles  CW/CCW at chest high      Manual Therapy   Manual Therapy  Passive ROM;Joint mobilization    Joint Mobilization  grade II all GH jt motions    Passive ROM  Rt shoulder flex, IR, ER                PT Short Term Goals - 04/13/19 1734      PT SHORT TERM GOAL #1   Title  independent with initial HEP    Status  Achieved        PT Long Term Goals - 05/13/19 1103      PT LONG TERM GOAL #1   Title  Patient will demonstrate proper technique with her home exercise program to improve shoulder ROM.    Status  Partially Met      PT LONG TERM GOAL #3   Title  report no difficulty with dressing or doing hair    Status  Partially Met             Plan - 05/13/19 1058    Clinical Impression Statement  Patient reports a little sore but not bad after the exercises, she really is demonstrating good AAROM.  She does have some knots in the deltoid.  Cues needed to decreaes shoulder elevation    PT Next Visit Plan  start week 7 next visit    Consulted and Agree with Plan of Care  Patient       Patient will benefit from skilled therapeutic intervention in order to improve the following deficits and impairments:  Improper body mechanics, Pain, Increased muscle spasms, Postural dysfunction, Impaired UE functional use, Decreased strength, Decreased range of motion, Increased edema  Visit Diagnosis: Stiffness of right shoulder, not elsewhere classified  Localized edema  Acute pain of right shoulder     Problem List Patient Active Problem List   Diagnosis Date Noted  . Class 2 severe obesity with serious comorbidity and body mass index (BMI) of 37.0 to 37.9 in adult [facility]) 01/15/2019  . Status  post gastric surgery 07/29/2018  . Prediabetes 03/06/2018  . Vitamin D deficiency 02/18/2018  . Chemotherapy-induced peripheral neuropathy ([place]) 02/15/2017  . Port catheter in place 11/02/2016  . Genetic testing 09/19/2016  . Pre-operative clearance 07/23/2016  . Malignant neoplasm of lower-outer quadrant of left breast of female, estrogen receptor positive ([place]) 07/10/2016  . Essential hypertension   . Chest pain 09/30/2015  . DVT, lower extremity ([place]) 06/18/2011  . DVT of upper extremity (deep vein thrombosis) ([place]) 06/18/2011  . Greenfield filter in place 06/18/2011  . History of stroke 06/18/2011  . PFO (patent foramen ovale) 06/18/2011  . Status post gastric bypass for obesity 06/18/2011  . PELVIC PAIN, CHRONIC 07/20/2008  . FIBROIDS, UTERUS 07/16/2008  . ASTHMA 07/16/2008  . FIBROMYALGIA 07/16/2008  . CARPAL TUNNEL SYNDROME, HX OF 07/16/2008  . History of pulmonary embolus (PE) 07/16/2008    [place]ner Boast., PT 05/13/2019, 11:34 AM  [address] [place], [name], [address] Phone: [phone]   Fax:  [phone]  Name: [name] MRN: [id number] Date of Birth: 1960-11-16

## 2019-05-19 ENCOUNTER — Ambulatory Visit: Payer: Medicare Other | Admitting: Physical Therapy

## 2019-05-19 ENCOUNTER — Encounter: Payer: Self-pay | Admitting: Physical Therapy

## 2019-05-19 ENCOUNTER — Other Ambulatory Visit: Payer: Self-pay

## 2019-05-19 DIAGNOSIS — M25511 Pain in right shoulder: Secondary | ICD-10-CM

## 2019-05-19 DIAGNOSIS — M25611 Stiffness of right shoulder, not elsewhere classified: Secondary | ICD-10-CM | POA: Diagnosis not present

## 2019-05-19 DIAGNOSIS — R600 Localized edema: Secondary | ICD-10-CM | POA: Diagnosis not present

## 2019-05-19 NOTE — Therapy (Signed)
[place] [place], [name], [address] Phone: [phone]   Fax:  [phone]  Physical Therapy Treatment  Patient Details  Name: [name] MRN: [id number] Date of Birth: Nov 12, 1960 Referring Provider (PT): [name] Date: 05/19/2019  PT End of Session - 05/19/19 1053    Visit Number  11    Date for PT Re-Evaluation  06/02/19    PT Start Time  1010    PT Stop Time  1110    PT Time Calculation (min)  60 min    Activity Tolerance  Patient tolerated treatment well    Behavior During Therapy  [facility] for tasks assessed/performed       Past Medical History:  Diagnosis Date  . Anemia    since bypass  . Arthritis    osteoarthritis  . Asthma    states no asthma attack since 2002  . Back pain   . Breast cancer ([place]) 06/26/16 bx   left breast  . Breast cancer ([place])   . Chronic back pain   . Complication of anesthesia    states takes more than normal to put her to sleep  . Constipation   . Dental bridge present    upper  . Dental crowns present   . DVT of upper extremity (deep vein thrombosis) ([place])   . Fibromyalgia   . Genetic testing 09/19/2016   Ms. Rosiak underwent genetic counseling and testing for hereditary cancer syndromes on 08/21/2016. Her results were negative for mutations in all 46 genes analyzed by [employer]'s 46-gene Common Hereditary Cancers Panel. Genes analyzed include: APC, ATM, AXIN2, BARD1, BMPR1A, BRCA1, BRCA2, BRIP1, CDH1, CDKN2A, CHEK2, CTNNA1, DICER1, EPCAM, GREM1, HOXB13, KIT, MEN1, MLH1, MSH2, MSH3, MSH6, MUTYH, NBN,   . Headache(784.0)    migraines  . History of blood transfusion 06/2005  . History of gallstones   . History of pneumonia   . History of shingles 07/2011  . History of shingles   . HTN (hypertension)   . Itching   . Joint pain   . Muscle weakness   . Neuropathy   . Normal coronary arteries 2003  . Osteoarthropathy   . Personal history of  chemotherapy 2018  . Personal history of radiation therapy 2018  . PFO (patent foramen ovale)   . Presence of inferior vena cava filter   . Pulmonary embolism ([place])   . Sleep apnea    used CPAP until after bypass surg.  . Status post gastric bypass for obesity   . Stomach pain   . Stroke [facility]) 12/2002   right-sided weakness  . Urinary incontinence     Past Surgical History:  Procedure Laterality Date  . ABDOMINAL HYSTERECTOMY  11/1997   complete  . ANAL RECTAL MANOMETRY N/A 01/21/2019   Procedure: ANO RECTAL MANOMETRY;  Surgeon: Arta Silence, MD;  [facility];  Service: Endoscopy;  Laterality: N/A;  . ANTERIOR CERVICAL DECOMP/DISCECTOMY FUSION  02/05/2005   C5-6  . APPENDECTOMY  10/22/2008   laparoscopic  . BREAST LUMPECTOMY Left 07/2016  . BREAST LUMPECTOMY WITH RADIOACTIVE SEED AND SENTINEL LYMPH NODE BIOPSY Left 07/26/2016   Procedure: BREAST LUMPECTOMY WITH RADIOACTIVE SEED AND SENTINEL LYMPH NODE BIOPSY;  Surgeon: Erroll Luna, MD;  Location: [facility];  Service: General;  Laterality: Left;  . BUNIONECTOMY  05/1980   both feet  . BUNIONECTOMY  08/2011   left foot  . CARDIAC CATHETERIZATION  03/04/2002  . CARPAL  TUNNEL RELEASE  06/21/2009   right  . CARPAL TUNNEL RELEASE     left hand  . CARPAL TUNNEL RELEASE  10/03/2011   Procedure: CARPAL TUNNEL RELEASE;  Surgeon: Wynonia Sours, MD;  Location: [place];  Service: Orthopedics;  Laterality: Right;  CARPAL TUNNEL WITH HYPOTHENAR FAT PAD TRANSFER  . CERVICAL SPINE SURGERY  01/2005   titanium plate implanted  . CHOLECYSTECTOMY  1990  . ELBOW SURGERY  08/09/2004   decompression ulnar nerve right elbow  . ENTEROLYSIS  10/22/2008   laparoscopic abd. enterolysis  . GASTRIC ROUX-EN-Y  2009  . HEEL SPUR SURGERY  08/1997   left  . HEMORRHOID SURGERY  03/1993  . LAPAROSCOPIC LYSIS INTESTINAL ADHESIONS  02/14/2000  . NAILBED REPAIR  01/10/2005; 08/2011   exc. matrix bilat. great toe  . OTHER  SURGICAL HISTORY  12/1986   pt states that she had surgery to unclog her fallopean tubes  . PORTACATH PLACEMENT N/A 09/24/2016   Procedure: INSERTION PORT-A-CATH WITH [name];  Surgeon: Erroll Luna, MD;  Location: [place];  Service: General;  Laterality: N/A;  . SHOULDER SURGERY     bilat. - (left:  06/2005)  . TONSILLECTOMY  07/1995  . TRIGGER FINGER RELEASE  04/25/2006   decompression A-1 pulley left thumb  . TRIGGER FINGER RELEASE Right 11/11/2018   Procedure: RELEASE TRIGGER FINGER/A-1 PULLEY RIGHT THUMB, RIGHT RING FINGER;  Surgeon: Daryll Brod, MD;  Location: [place];  Service: Orthopedics;  Laterality: Right;  . UTERINE FIBROID SURGERY  12/95, 7/96   x2  . VENA CAVA FILTER PLACEMENT  2009   during Roux-en-Y surg.    There were no vitals filed for this visit.  Subjective Assessment - 05/19/19 1016    Subjective  Patient reports that she is feeling better, having less pain    Currently in Pain?  Yes    Pain Score  2     Pain Location  Shoulder    Pain Orientation  Right    Aggravating Factors   a lot of activity will increase the pain                       [facility] Adult PT Treatment/Exercise - 05/19/19 0001      Shoulder Exercises: Supine   Other Supine Exercises  chest press with wand.   right arm chest press, then isometric circles wihtout weight and with 2#      Shoulder Exercises: Standing   External Rotation  Both;20 reps;Theraband    Theraband Level (Shoulder External Rotation)  Level 1 (Yellow)    Internal Rotation  Left;20 reps;Theraband    Theraband Level (Shoulder Internal Rotation)  Level 1 (Yellow)    Internal Rotation Limitations  with wand behind back    Flexion  AAROM;Both;20 reps    Flexion Limitations  with wand sliding up the door frame    Extension  AAROM;Both;20 reps    Extension Limitations  with wand and PT overpressure to get more ROM    Row  Both;20 reps;Theraband    Theraband Level (Shoulder Row)  Level 2 (Red)       Shoulder Exercises: ROM/Strengthening   UBE (Upper Arm Bike)  level 3 x 6 minutes      Electrical Stimulation   Electrical Stimulation Location  right upper trap and neck area    Electrical Stimulation Action  IFC    Electrical Stimulation Parameters  supine    Electrical Stimulation Goals  Pain      Manual Therapy   Manual Therapy  Passive ROM;Joint mobilization    Joint Mobilization  grade II all GH jt motions    Soft tissue mobilization  to right upper trap, has a knot here    Passive ROM  Rt shoulder flex, IR, ER                PT Short Term Goals - 04/13/19 1734      PT SHORT TERM GOAL #1   Title  independent with initial HEP    Status  Achieved        PT Long Term Goals - 05/19/19 1055      PT LONG TERM GOAL #1   Title  Patient will demonstrate proper technique with her home exercise program to improve shoulder ROM.    Status  Partially Met      PT LONG TERM GOAL #4   Title  decrease pain 50%    Status  Partially Met            Plan - 05/19/19 1053    Clinical Impression Statement  Patient doing much better with motions and tolerance to the end ranges, she is still very weak but we are following protocol and cannot do much strength until 05/28/19    PT Next Visit Plan  may start the next phase of protocol next week, still go slow    Consulted and Agree with Plan of Care  Patient       Patient will benefit from skilled therapeutic intervention in order to improve the following deficits and impairments:  Improper body mechanics, Pain, Increased muscle spasms, Postural dysfunction, Impaired UE functional use, Decreased strength, Decreased range of motion, Increased edema  Visit Diagnosis: Stiffness of right shoulder, not elsewhere classified  Acute pain of right shoulder     Problem List Patient Active Problem List   Diagnosis Date Noted  . Class 2 severe obesity with serious comorbidity and body mass index (BMI) of 37.0 to 37.9 in adult [facility])  01/15/2019  . Status post gastric surgery 07/29/2018  . Prediabetes 03/06/2018  . Vitamin D deficiency 02/18/2018  . Chemotherapy-induced peripheral neuropathy ([place]) 02/15/2017  . Port catheter in place 11/02/2016  . Genetic testing 09/19/2016  . Pre-operative clearance 07/23/2016  . Malignant neoplasm of lower-outer quadrant of left breast of female, estrogen receptor positive ([place]) 07/10/2016  . Essential hypertension   . Chest pain 09/30/2015  . DVT, lower extremity ([place]) 06/18/2011  . DVT of upper extremity (deep vein thrombosis) ([place]) 06/18/2011  . [place] filter in place 06/18/2011  . History of stroke 06/18/2011  . PFO (patent foramen ovale) 06/18/2011  . Status post gastric bypass for obesity 06/18/2011  . PELVIC PAIN, CHRONIC 07/20/2008  . FIBROIDS, UTERUS 07/16/2008  . ASTHMA 07/16/2008  . FIBROMYALGIA 07/16/2008  . CARPAL TUNNEL SYNDROME, HX OF 07/16/2008  . History of pulmonary embolus (PE) 07/16/2008    Sumner Boast., PT 05/19/2019, 10:55 AM  [address] [place], [name], [address] Phone: [phone]   Fax:  [phone]  Name: [name] MRN: [id number] Date of Birth: [birth year]

## 2019-05-20 DIAGNOSIS — I10 Essential (primary) hypertension: Secondary | ICD-10-CM | POA: Diagnosis not present

## 2019-05-20 DIAGNOSIS — D649 Anemia, unspecified: Secondary | ICD-10-CM | POA: Diagnosis not present

## 2019-05-20 DIAGNOSIS — C50919 Malignant neoplasm of unspecified site of unspecified female breast: Secondary | ICD-10-CM | POA: Diagnosis not present

## 2019-05-20 DIAGNOSIS — M810 Age-related osteoporosis without current pathological fracture: Secondary | ICD-10-CM | POA: Diagnosis not present

## 2019-05-21 ENCOUNTER — Ambulatory Visit: Payer: Medicare Other | Admitting: Physical Therapy

## 2019-05-21 ENCOUNTER — Other Ambulatory Visit: Payer: Self-pay

## 2019-05-21 ENCOUNTER — Encounter: Payer: Self-pay | Admitting: Physical Therapy

## 2019-05-21 DIAGNOSIS — R600 Localized edema: Secondary | ICD-10-CM | POA: Diagnosis not present

## 2019-05-21 DIAGNOSIS — M25511 Pain in right shoulder: Secondary | ICD-10-CM

## 2019-05-21 DIAGNOSIS — M25611 Stiffness of right shoulder, not elsewhere classified: Secondary | ICD-10-CM | POA: Diagnosis not present

## 2019-05-21 NOTE — Therapy (Signed)
[place], [name], [address] Phone: [phone]   Fax:  [phone]  Physical Therapy Treatment  Patient Details  Name: [name] MRN: [id number] Date of Birth: 05-13-1961 Referring Provider (PT): [name] Date: 05/21/2019  PT End of Session - 05/21/19 1044    Visit Number  12    Date for PT Re-Evaluation  06/02/19    PT Start Time  1009    PT Stop Time  1058    PT Time Calculation (min)  49 min    Activity Tolerance  Patient tolerated treatment well    Behavior During Therapy  [facility] for tasks assessed/performed       Past Medical History:  Diagnosis Date  . Anemia    since bypass  . Arthritis    osteoarthritis  . Asthma    states no asthma attack since 2002  . Back pain   . Breast cancer ([place]) 06/26/16 bx   left breast  . Breast cancer ([place])   . Chronic back pain   . Complication of anesthesia    states takes more than normal to put her to sleep  . Constipation   . Dental bridge present    upper  . Dental crowns present   . DVT of upper extremity (deep vein thrombosis) ([place])   . Fibromyalgia   . Genetic testing 09/19/2016   Ms. Sookram underwent genetic counseling and testing for hereditary cancer syndromes on 08/21/2016. Her results were negative for mutations in all 46 genes analyzed by [employer]'s 46-gene Common Hereditary Cancers Panel. Genes analyzed include: APC, ATM, AXIN2, BARD1, BMPR1A, BRCA1, BRCA2, BRIP1, CDH1, CDKN2A, CHEK2, CTNNA1, DICER1, EPCAM, GREM1, HOXB13, KIT, MEN1, MLH1, MSH2, MSH3, MSH6, MUTYH, NBN,   . Headache(784.0)    migraines  . History of blood transfusion 06/2005  . History of gallstones   . History of pneumonia   . History of shingles 07/2011  . History of shingles   . HTN (hypertension)   . Itching   . Joint pain   . Muscle weakness   . Neuropathy   . Normal coronary arteries 2003  . Osteoarthropathy   . Personal history of  chemotherapy 2018  . Personal history of radiation therapy 2018  . PFO (patent foramen ovale)   . Presence of inferior vena cava filter   . Pulmonary embolism ([place])   . Sleep apnea    used CPAP until after bypass surg.  . Status post gastric bypass for obesity   . Stomach pain   . Stroke [facility]) 12/2002   right-sided weakness  . Urinary incontinence     Past Surgical History:  Procedure Laterality Date  . ABDOMINAL HYSTERECTOMY  11/1997   complete  . ANAL RECTAL MANOMETRY N/A 01/21/2019   Procedure: ANO RECTAL MANOMETRY;  Surgeon: Arta Silence, MD;  [facility];  Service: Endoscopy;  Laterality: N/A;  . ANTERIOR CERVICAL DECOMP/DISCECTOMY FUSION  02/05/2005   C5-6  . APPENDECTOMY  10/22/2008   laparoscopic  . BREAST LUMPECTOMY Left 07/2016  . BREAST LUMPECTOMY WITH RADIOACTIVE SEED AND SENTINEL LYMPH NODE BIOPSY Left 07/26/2016   Procedure: BREAST LUMPECTOMY WITH RADIOACTIVE SEED AND SENTINEL LYMPH NODE BIOPSY;  Surgeon: Erroll Luna, MD;  Location: [place];  Service: General;  Laterality: Left;  . BUNIONECTOMY  05/1980   both feet  . BUNIONECTOMY  08/2011   left foot  . CARDIAC CATHETERIZATION  03/04/2002  . CARPAL  TUNNEL RELEASE  06/21/2009   right  . CARPAL TUNNEL RELEASE     left hand  . CARPAL TUNNEL RELEASE  10/03/2011   Procedure: CARPAL TUNNEL RELEASE;  Surgeon: Wynonia Sours, MD;  Location: [place];  Service: Orthopedics;  Laterality: Right;  CARPAL TUNNEL WITH HYPOTHENAR FAT PAD TRANSFER  . CERVICAL SPINE SURGERY  01/2005   titanium plate implanted  . CHOLECYSTECTOMY  1990  . ELBOW SURGERY  08/09/2004   decompression ulnar nerve right elbow  . ENTEROLYSIS  10/22/2008   laparoscopic abd. enterolysis  . GASTRIC ROUX-EN-Y  2009  . HEEL SPUR SURGERY  08/1997   left  . HEMORRHOID SURGERY  03/1993  . LAPAROSCOPIC LYSIS INTESTINAL ADHESIONS  02/14/2000  . NAILBED REPAIR  01/10/2005; 08/2011   exc. matrix bilat. great toe  . OTHER  SURGICAL HISTORY  12/1986   pt states that she had surgery to unclog her fallopean tubes  . PORTACATH PLACEMENT N/A 09/24/2016   Procedure: INSERTION PORT-A-CATH WITH [name];  Surgeon: Erroll Luna, MD;  Location: [place];  Service: General;  Laterality: N/A;  . SHOULDER SURGERY     bilat. - (left:  06/2005)  . TONSILLECTOMY  07/1995  . TRIGGER FINGER RELEASE  04/25/2006   decompression A-1 pulley left thumb  . TRIGGER FINGER RELEASE Right 11/11/2018   Procedure: RELEASE TRIGGER FINGER/A-1 PULLEY RIGHT THUMB, RIGHT RING FINGER;  Surgeon: Daryll Brod, MD;  Location: [place];  Service: Orthopedics;  Laterality: Right;  . UTERINE FIBROID SURGERY  12/95, 7/96   x2  . VENA CAVA FILTER PLACEMENT  2009   during Roux-en-Y surg.    There were no vitals filed for this visit.  Subjective Assessment - 05/21/19 1010    Subjective  Patient reports that she has been having much more pain since our last visit she is unsure of anything that caused the pain    Currently in Pain?  Yes    Pain Score  7     Pain Location  Shoulder    Pain Orientation  Right    Aggravating Factors   touching it, unsure why the pain has been worse                       [facility] Adult PT Treatment/Exercise - 05/21/19 0001      Shoulder Exercises: Supine   Other Supine Exercises  chest press with limited ROM to protect and to limit pain      Shoulder Exercises: Standing   External Rotation  Both;20 reps;Theraband    Theraband Level (Shoulder External Rotation)  Level 1 (Yellow)    Internal Rotation  Left;20 reps    Internal Rotation Limitations  with wand behind back    Flexion  AAROM;Both;20 reps    Flexion Limitations  with wand sliding up the door frame    Extension  AAROM;Both;20 reps;Theraband    Theraband Level (Shoulder Extension)  Level 1 (Yellow)    Extension Limitations  with wand and PT overpressure to get more ROM    Row  Both;20 reps;Theraband    Theraband Level (Shoulder Row)   Level 1 (Yellow)    Other Standing Exercises  biceps with wand    Other Standing Exercises  finger ladder      Shoulder Exercises: ROM/Strengthening   UBE (Upper Arm Bike)  level 3 x 6 minutes      Vasopneumatic   Number Minutes Vasopneumatic   10 minutes  Vasopnuematic Location   Shoulder    Vasopneumatic Pressure  Low    Vasopneumatic Temperature   40      Manual Therapy   Manual Therapy  Passive ROM;Joint mobilization    Soft tissue mobilization  right anterior and lateral shoulder    Passive ROM  Rt shoulder flex, IR, ER                PT Short Term Goals - 04/13/19 1734      PT SHORT TERM GOAL #1   Title  independent with initial HEP    Status  Achieved        PT Long Term Goals - 05/19/19 1055      PT LONG TERM GOAL #1   Title  Patient will demonstrate proper technique with her home exercise program to improve shoulder ROM.    Status  Partially Met      PT LONG TERM GOAL #4   Title  decrease pain 50%    Status  Partially Met            Plan - 05/21/19 1044    Clinical Impression Statement  Patient reports that she has had more pain since our last visit, unsure of a specific cause, I went back and it does not appear that I added much to the exercises.  Backed off today, she still had good PROM a little more sore wiht this.    PT Next Visit Plan  may start the next phase of protocol next week, still go slow    Consulted and Agree with Plan of Care  Patient       Patient will benefit from skilled therapeutic intervention in order to improve the following deficits and impairments:  Improper body mechanics, Pain, Increased muscle spasms, Postural dysfunction, Impaired UE functional use, Decreased strength, Decreased range of motion, Increased edema  Visit Diagnosis: Stiffness of right shoulder, not elsewhere classified  Acute pain of right shoulder  Localized edema     Problem List Patient Active Problem List   Diagnosis Date Noted  .  Class 2 severe obesity with serious comorbidity and body mass index (BMI) of 37.0 to 37.9 in adult [facility]) 01/15/2019  . Status post gastric surgery 07/29/2018  . Prediabetes 03/06/2018  . Vitamin D deficiency 02/18/2018  . Chemotherapy-induced peripheral neuropathy ([place]) 02/15/2017  . Port catheter in place 11/02/2016  . Genetic testing 09/19/2016  . Pre-operative clearance 07/23/2016  . Malignant neoplasm of lower-outer quadrant of left breast of female, estrogen receptor positive ([place]) 07/10/2016  . Essential hypertension   . Chest pain 09/30/2015  . DVT, lower extremity ([place]) 06/18/2011  . DVT of upper extremity (deep vein thrombosis) ([place]) 06/18/2011  . [place] filter in place 06/18/2011  . History of stroke 06/18/2011  . PFO (patent foramen ovale) 06/18/2011  . Status post gastric bypass for obesity 06/18/2011  . PELVIC PAIN, CHRONIC 07/20/2008  . FIBROIDS, UTERUS 07/16/2008  . ASTHMA 07/16/2008  . FIBROMYALGIA 07/16/2008  . CARPAL TUNNEL SYNDROME, HX OF 07/16/2008  . History of pulmonary embolus (PE) 07/16/2008    Sumner Boast., PT 05/21/2019, 10:48 AM  [address] [place], [name], [address] Phone: [phone]   Fax:  [phone]  Name: Miela Desjardin MRN: [id number] Date of Birth: [birth year]

## 2019-05-25 ENCOUNTER — Telehealth: Payer: Self-pay | Admitting: Hematology and Oncology

## 2019-05-25 NOTE — Telephone Encounter (Signed)
Returned patient's phone call regarding rescheduling 02/15 appointment time, per patient's request appointment time has been rescheduled.

## 2019-05-26 ENCOUNTER — Ambulatory Visit: Payer: Medicare Other | Attending: Orthopedic Surgery | Admitting: Physical Therapy

## 2019-05-26 ENCOUNTER — Encounter: Payer: Self-pay | Admitting: Physical Therapy

## 2019-05-26 ENCOUNTER — Ambulatory Visit (INDEPENDENT_AMBULATORY_CARE_PROVIDER_SITE_OTHER): Payer: Medicare Other | Admitting: Bariatrics

## 2019-05-26 ENCOUNTER — Other Ambulatory Visit: Payer: Self-pay

## 2019-05-26 DIAGNOSIS — M25611 Stiffness of right shoulder, not elsewhere classified: Secondary | ICD-10-CM | POA: Diagnosis not present

## 2019-05-26 DIAGNOSIS — M5106 Intervertebral disc disorders with myelopathy, lumbar region: Secondary | ICD-10-CM | POA: Diagnosis not present

## 2019-05-26 DIAGNOSIS — Z6841 Body Mass Index (BMI) 40.0 and over, adult: Secondary | ICD-10-CM | POA: Diagnosis not present

## 2019-05-26 DIAGNOSIS — M545 Low back pain: Secondary | ICD-10-CM | POA: Diagnosis not present

## 2019-05-26 DIAGNOSIS — M25511 Pain in right shoulder: Secondary | ICD-10-CM | POA: Diagnosis not present

## 2019-05-26 DIAGNOSIS — R600 Localized edema: Secondary | ICD-10-CM | POA: Diagnosis not present

## 2019-05-26 DIAGNOSIS — M47816 Spondylosis without myelopathy or radiculopathy, lumbar region: Secondary | ICD-10-CM | POA: Diagnosis not present

## 2019-05-26 NOTE — Therapy (Signed)
[address] [place], [name], [address] Phone: [phone]   Fax:  [phone]  Physical Therapy Treatment  Patient Details  Name: [name] MRN: [id number] Date of Birth: 09-Nov-1960 Referring Provider (PT): [name] Date: 05/26/2019  PT End of Session - 05/26/19 1736    Visit Number  13    Date for PT Re-Evaluation  06/02/19    PT Start Time  1648    PT Stop Time  1739    PT Time Calculation (min)  51 min       Past Medical History:  Diagnosis Date  . Anemia    since bypass  . Arthritis    osteoarthritis  . Asthma    states no asthma attack since 2002  . Back pain   . Breast cancer ([place]) 06/26/16 bx   left breast  . Breast cancer ([place])   . Chronic back pain   . Complication of anesthesia    states takes more than normal to put her to sleep  . Constipation   . Dental bridge present    upper  . Dental crowns present   . DVT of upper extremity (deep vein thrombosis) ([place])   . Fibromyalgia   . Genetic testing 09/19/2016   Ms. Hearn underwent genetic counseling and testing for hereditary cancer syndromes on 08/21/2016. Her results were negative for mutations in all 46 genes analyzed by [employer]'s 46-gene Common Hereditary Cancers Panel. Genes analyzed include: APC, ATM, AXIN2, BARD1, BMPR1A, BRCA1, BRCA2, BRIP1, CDH1, CDKN2A, CHEK2, CTNNA1, DICER1, EPCAM, GREM1, HOXB13, KIT, MEN1, MLH1, MSH2, MSH3, MSH6, MUTYH, NBN,   . Headache(784.0)    migraines  . History of blood transfusion 06/2005  . History of gallstones   . History of pneumonia   . History of shingles 07/2011  . History of shingles   . HTN (hypertension)   . Itching   . Joint pain   . Muscle weakness   . Neuropathy   . Normal coronary arteries 2003  . Osteoarthropathy   . Personal history of chemotherapy 2018  . Personal history of radiation therapy 2018  . PFO (patent foramen ovale)   . Presence of inferior  vena cava filter   . Pulmonary embolism ([place])   . Sleep apnea    used CPAP until after bypass surg.  . Status post gastric bypass for obesity   . Stomach pain   . Stroke [facility]) 12/2002   right-sided weakness  . Urinary incontinence     Past Surgical History:  Procedure Laterality Date  . ABDOMINAL HYSTERECTOMY  11/1997   complete  . ANAL RECTAL MANOMETRY N/A 01/21/2019   Procedure: ANO RECTAL MANOMETRY;  Surgeon: Arta Silence, MD;  [facility];  Service: Endoscopy;  Laterality: N/A;  . ANTERIOR CERVICAL DECOMP/DISCECTOMY FUSION  02/05/2005   C5-6  . APPENDECTOMY  10/22/2008   laparoscopic  . BREAST LUMPECTOMY Left 07/2016  . BREAST LUMPECTOMY WITH RADIOACTIVE SEED AND SENTINEL LYMPH NODE BIOPSY Left 07/26/2016   Procedure: BREAST LUMPECTOMY WITH RADIOACTIVE SEED AND SENTINEL LYMPH NODE BIOPSY;  Surgeon: Erroll Luna, MD;  Location: [facility];  Service: General;  Laterality: Left;  . BUNIONECTOMY  05/1980   both feet  . BUNIONECTOMY  08/2011   left foot  . CARDIAC CATHETERIZATION  03/04/2002  . CARPAL TUNNEL RELEASE  06/21/2009   right  . CARPAL TUNNEL RELEASE     left hand  . CARPAL  TUNNEL RELEASE  10/03/2011   Procedure: CARPAL TUNNEL RELEASE;  Surgeon: Wynonia Sours, MD;  Location: [facility];  Service: Orthopedics;  Laterality: Right;  CARPAL TUNNEL WITH HYPOTHENAR FAT PAD TRANSFER  . CERVICAL SPINE SURGERY  01/2005   titanium plate implanted  . CHOLECYSTECTOMY  1990  . ELBOW SURGERY  08/09/2004   decompression ulnar nerve right elbow  . ENTEROLYSIS  10/22/2008   laparoscopic abd. enterolysis  . GASTRIC ROUX-EN-Y  2009  . HEEL SPUR SURGERY  08/1997   left  . HEMORRHOID SURGERY  03/1993  . LAPAROSCOPIC LYSIS INTESTINAL ADHESIONS  02/14/2000  . NAILBED REPAIR  01/10/2005; 08/2011   exc. matrix bilat. great toe  . OTHER SURGICAL HISTORY  12/1986   pt states that she had surgery to unclog her fallopean tubes  . PORTACATH PLACEMENT N/A 09/24/2016    Procedure: INSERTION PORT-A-CATH WITH [name];  Surgeon: Erroll Luna, MD;  Location: [facility];  Service: General;  Laterality: N/A;  . SHOULDER SURGERY     bilat. - (left:  06/2005)  . TONSILLECTOMY  07/1995  . TRIGGER FINGER RELEASE  04/25/2006   decompression A-1 pulley left thumb  . TRIGGER FINGER RELEASE Right 11/11/2018   Procedure: RELEASE TRIGGER FINGER/A-1 PULLEY RIGHT THUMB, RIGHT RING FINGER;  Surgeon: Daryll Brod, MD;  Location: [facility];  Service: Orthopedics;  Laterality: Right;  . UTERINE FIBROID SURGERY  12/95, 7/96   x2  . VENA CAVA FILTER PLACEMENT  2009   during Roux-en-Y surg.    There were no vitals filed for this visit.  Subjective Assessment - 05/26/19 1649    Subjective  I am just tired of the process of healing with this surgery, I had less pain after last time    Currently in Pain?  Yes    Pain Score  5     Pain Location  Shoulder    Pain Orientation  Right    Pain Descriptors / Indicators  Sore    Pain Relieving Factors  not using the arm         [facility] PT Assessment - 05/26/19 0001      ROM / Strength   AROM / PROM / Strength  AROM      AROM   AROM Assessment Site  Shoulder    Right/Left Shoulder  Right    Right Shoulder Flexion  105 Degrees    Right Shoulder Internal Rotation  50 Degrees                   [facility] Adult PT Treatment/Exercise - 05/26/19 0001      Shoulder Exercises: Seated   Extension Limitations  bent over extension 2x10    Row Limitations  bent over row 2x10    Horizontal ABduction Limitations  bent over horizontal abduction      Shoulder Exercises: Standing   External Rotation  Both;20 reps;Theraband    Theraband Level (Shoulder External Rotation)  Level 1 (Yellow)    Extension  AAROM;Both;20 reps;Theraband    Theraband Level (Shoulder Extension)  Level 1 (Yellow)    Row  Both;20 reps;Theraband    Theraband Level (Shoulder Row)  Level 1 (Yellow)    Other Standing Exercises  finger ladder       Shoulder Exercises: ROM/Strengthening   UBE (Upper Arm Bike)  level 3 x 6 minutes    Wall Wash  flexion both hands and then small cirles CW/CCW at chest high  Shoulder Exercises: Stretch   Corner Stretch  3 reps;10 seconds    Corner Stretch Limitations  in Surveyor, quantity  4 reps    Internal Rotation Stretch Limitations  10 seconds      Acupuncturist Location  right upper trap Therapist, music  IFC    Electrical Stimulation Parameters  sitting    Electrical Stimulation Goals  Pain               PT Short Term Goals - 04/13/19 1734      PT SHORT TERM GOAL #1   Title  independent with initial HEP    Status  Achieved        PT Long Term Goals - 05/19/19 1055      PT LONG TERM GOAL #1   Title  Patient will demonstrate proper technique with her home exercise program to improve shoulder ROM.    Status  Partially Met      PT LONG TERM GOAL #4   Title  decrease pain 50%    Status  Partially Met            Plan - 05/26/19 1741    Clinical Impression Statement  Patient doing a little better, she is frustrated at how she still hurts and is not strong, I spoke with her about the protocol and some of the expected outcomes and how this is a very painful surgery,  We measured AROM for the first time today and she did very well, she is weak and painful above 90 degrees flexion, still will start out with upper trap compensation without cues.    PT Next Visit Plan  starting week 8    Consulted and Agree with Plan of Care  Patient       Patient will benefit from skilled therapeutic intervention in order to improve the following deficits and impairments:  Improper body mechanics, Pain, Increased muscle spasms, Postural dysfunction, Impaired UE functional use, Decreased strength, Decreased range of motion, Increased edema  Visit Diagnosis: Stiffness of right shoulder, not elsewhere  classified  Acute pain of right shoulder  Localized edema     Problem List Patient Active Problem List   Diagnosis Date Noted  . Class 2 severe obesity with serious comorbidity and body mass index (BMI) of 37.0 to 37.9 in adult [facility]) 01/15/2019  . Status post gastric surgery 07/29/2018  . Prediabetes 03/06/2018  . Vitamin D deficiency 02/18/2018  . Chemotherapy-induced peripheral neuropathy ([place]) 02/15/2017  . Port catheter in place 11/02/2016  . Genetic testing 09/19/2016  . Pre-operative clearance 07/23/2016  . Malignant neoplasm of lower-outer quadrant of left breast of female, estrogen receptor positive ([place]) 07/10/2016  . Essential hypertension   . Chest pain 09/30/2015  . DVT, lower extremity ([place]) 06/18/2011  . DVT of upper extremity (deep vein thrombosis) ([place]) 06/18/2011  . [place] filter in place 06/18/2011  . History of stroke 06/18/2011  . PFO (patent foramen ovale) 06/18/2011  . Status post gastric bypass for obesity 06/18/2011  . PELVIC PAIN, CHRONIC 07/20/2008  . FIBROIDS, UTERUS 07/16/2008  . ASTHMA 07/16/2008  . FIBROMYALGIA 07/16/2008  . CARPAL TUNNEL SYNDROME, HX OF 07/16/2008  . History of pulmonary embolus (PE) 07/16/2008    Sumner Boast., PT 05/26/2019, 5:52 PM  [address] [place], [name], [address] Phone: [phone]   Fax:  [phone]  Name: [name] MRN: [id number] Date of Birth: [birth year]

## 2019-05-28 ENCOUNTER — Ambulatory Visit: Payer: Medicare Other | Admitting: Physical Therapy

## 2019-05-28 ENCOUNTER — Encounter: Payer: Self-pay | Admitting: Physical Therapy

## 2019-05-28 ENCOUNTER — Other Ambulatory Visit: Payer: Self-pay

## 2019-05-28 DIAGNOSIS — M25611 Stiffness of right shoulder, not elsewhere classified: Secondary | ICD-10-CM | POA: Diagnosis not present

## 2019-05-28 DIAGNOSIS — M25511 Pain in right shoulder: Secondary | ICD-10-CM

## 2019-05-28 DIAGNOSIS — R600 Localized edema: Secondary | ICD-10-CM | POA: Diagnosis not present

## 2019-05-28 NOTE — Therapy (Signed)
[address] [place], [name], [address] Phone: [phone]   Fax:  [phone]  Physical Therapy Treatment  Patient Details  Name: [name] MRN: [id number] Date of Birth: May 09, 1961 Referring Provider (PT): [name] Date: 05/28/2019  PT End of Session - 05/28/19 1733    Visit Number  14    Date for PT Re-Evaluation  06/02/19    PT Start Time  1648    PT Stop Time  1743    PT Time Calculation (min)  55 min    Activity Tolerance  Patient tolerated treatment well    Behavior During Therapy  [facility] for tasks assessed/performed       Past Medical History:  Diagnosis Date  . Anemia    since bypass  . Arthritis    osteoarthritis  . Asthma    states no asthma attack since 2002  . Back pain   . Breast cancer ([place]) 06/26/16 bx   left breast  . Breast cancer ([place])   . Chronic back pain   . Complication of anesthesia    states takes more than normal to put her to sleep  . Constipation   . Dental bridge present    upper  . Dental crowns present   . DVT of upper extremity (deep vein thrombosis) ([place])   . Fibromyalgia   . Genetic testing 09/19/2016   Ms. Barham underwent genetic counseling and testing for hereditary cancer syndromes on 08/21/2016. Her results were negative for mutations in all 46 genes analyzed by [employer]'s 46-gene Common Hereditary Cancers Panel. Genes analyzed include: APC, ATM, AXIN2, BARD1, BMPR1A, BRCA1, BRCA2, BRIP1, CDH1, CDKN2A, CHEK2, CTNNA1, DICER1, EPCAM, GREM1, HOXB13, KIT, MEN1, MLH1, MSH2, MSH3, MSH6, MUTYH, NBN,   . Headache(784.0)    migraines  . History of blood transfusion 06/2005  . History of gallstones   . History of pneumonia   . History of shingles 07/2011  . History of shingles   . HTN (hypertension)   . Itching   . Joint pain   . Muscle weakness   . Neuropathy   . Normal coronary arteries 2003  . Osteoarthropathy   . Personal history of  chemotherapy 2018  . Personal history of radiation therapy 2018  . PFO (patent foramen ovale)   . Presence of inferior vena cava filter   . Pulmonary embolism ([place])   . Sleep apnea    used CPAP until after bypass surg.  . Status post gastric bypass for obesity   . Stomach pain   . Stroke [facility]) 12/2002   right-sided weakness  . Urinary incontinence     Past Surgical History:  Procedure Laterality Date  . ABDOMINAL HYSTERECTOMY  11/1997   complete  . ANAL RECTAL MANOMETRY N/A 01/21/2019   Procedure: ANO RECTAL MANOMETRY;  Surgeon: Arta Silence, MD;  [facility];  Service: Endoscopy;  Laterality: N/A;  . ANTERIOR CERVICAL DECOMP/DISCECTOMY FUSION  02/05/2005   C5-6  . APPENDECTOMY  10/22/2008   laparoscopic  . BREAST LUMPECTOMY Left 07/2016  . BREAST LUMPECTOMY WITH RADIOACTIVE SEED AND SENTINEL LYMPH NODE BIOPSY Left 07/26/2016   Procedure: BREAST LUMPECTOMY WITH RADIOACTIVE SEED AND SENTINEL LYMPH NODE BIOPSY;  Surgeon: Erroll Luna, MD;  Location: [facility];  Service: General;  Laterality: Left;  . BUNIONECTOMY  05/1980   both feet  . BUNIONECTOMY  08/2011   left foot  . CARDIAC CATHETERIZATION  03/04/2002  . CARPAL  TUNNEL RELEASE  06/21/2009   right  . CARPAL TUNNEL RELEASE     left hand  . CARPAL TUNNEL RELEASE  10/03/2011   Procedure: CARPAL TUNNEL RELEASE;  Surgeon: Wynonia Sours, MD;  Location: [facility];  Service: Orthopedics;  Laterality: Right;  CARPAL TUNNEL WITH HYPOTHENAR FAT PAD TRANSFER  . CERVICAL SPINE SURGERY  01/2005   titanium plate implanted  . CHOLECYSTECTOMY  1990  . ELBOW SURGERY  08/09/2004   decompression ulnar nerve right elbow  . ENTEROLYSIS  10/22/2008   laparoscopic abd. enterolysis  . GASTRIC ROUX-EN-Y  2009  . HEEL SPUR SURGERY  08/1997   left  . HEMORRHOID SURGERY  03/1993  . LAPAROSCOPIC LYSIS INTESTINAL ADHESIONS  02/14/2000  . NAILBED REPAIR  01/10/2005; 08/2011   exc. matrix bilat. great toe  . OTHER  SURGICAL HISTORY  12/1986   pt states that she had surgery to unclog her fallopean tubes  . PORTACATH PLACEMENT N/A 09/24/2016   Procedure: INSERTION PORT-A-CATH WITH [name];  Surgeon: Erroll Luna, MD;  Location: [facility];  Service: General;  Laterality: N/A;  . SHOULDER SURGERY     bilat. - (left:  06/2005)  . TONSILLECTOMY  07/1995  . TRIGGER FINGER RELEASE  04/25/2006   decompression A-1 pulley left thumb  . TRIGGER FINGER RELEASE Right 11/11/2018   Procedure: RELEASE TRIGGER FINGER/A-1 PULLEY RIGHT THUMB, RIGHT RING FINGER;  Surgeon: Daryll Brod, MD;  Location: [facility];  Service: Orthopedics;  Laterality: Right;  . UTERINE FIBROID SURGERY  12/95, 7/96   x2  . VENA CAVA FILTER PLACEMENT  2009   during Roux-en-Y surg.    There were no vitals filed for this visit.  Subjective Assessment - 05/28/19 1651    Subjective  Just sore today    Currently in Pain?  Yes    Pain Score  5     Pain Location  Shoulder    Pain Orientation  Right    Pain Descriptors / Indicators  Sore    Aggravating Factors   crocheting does hurt some                       [facility] Adult PT Treatment/Exercise - 05/28/19 0001      Shoulder Exercises: Standing   External Rotation  Both;20 reps;Theraband    Theraband Level (Shoulder External Rotation)  Level 1 (Yellow)    Internal Rotation  Left;20 reps;Right;Theraband    Theraband Level (Shoulder Internal Rotation)  Level 2 (Red)    Extension  Both;20 reps;Theraband    Theraband Level (Shoulder Extension)  Level 2 (Red)    Row  Both;20 reps;Theraband    Theraband Level (Shoulder Row)  Level 2 (Red)    Other Standing Exercises  biceps 3#, triceps 10# 2x10, ball walk up wall, approximation of ball against wall with small isometric circles    Other Standing Exercises  finger ladder with eccentric slides down      Shoulder Exercises: ROM/Strengthening   UBE (Upper Arm Bike)  level 3 x 6 minutes      Electrical Stimulation   Electrical  Stimulation Location  right upper trap Barrister's clerk Action  IFC    Electrical Stimulation Parameters  sitting    Electrical Stimulation Goals  Pain      Manual Therapy   Manual Therapy  Passive ROM;Joint mobilization    Passive ROM  right shoulder to end ranges with light stretch some right  UE neural tension stretches               PT Short Term Goals - 04/13/19 1734      PT SHORT TERM GOAL #1   Title  independent with initial HEP    Status  Achieved        PT Long Term Goals - 05/28/19 1735      PT LONG TERM GOAL #2   Title  increase AROM of the right shoulder flexion to 150 degrees    Status  Partially Met      PT LONG TERM GOAL #3   Title  report no difficulty with dressing or doing hair    Status  Partially Met            Plan - 05/28/19 1734    Clinical Impression Statement  Patient did well today, has some tightness in the anterior shoulder, with the eccentric motions on the finger ladder and with the ball rolling she needed some cues to decrease the elbow from going way out as a compensatory pattern    PT Next Visit Plan  slowly add strength and function    Consulted and Agree with Plan of Care  Patient       Patient will benefit from skilled therapeutic intervention in order to improve the following deficits and impairments:  Improper body mechanics, Pain, Increased muscle spasms, Postural dysfunction, Impaired UE functional use, Decreased strength, Decreased range of motion, Increased edema  Visit Diagnosis: Stiffness of right shoulder, not elsewhere classified  Acute pain of right shoulder  Localized edema     Problem List Patient Active Problem List   Diagnosis Date Noted  . Class 2 severe obesity with serious comorbidity and body mass index (BMI) of 37.0 to 37.9 in adult [facility]) 01/15/2019  . Status post gastric surgery 07/29/2018  . Prediabetes 03/06/2018  . Vitamin D deficiency 02/18/2018  .  Chemotherapy-induced peripheral neuropathy ([place]) 02/15/2017  . Port catheter in place 11/02/2016  . Genetic testing 09/19/2016  . Pre-operative clearance 07/23/2016  . Malignant neoplasm of lower-outer quadrant of left breast of female, estrogen receptor positive ([place]) 07/10/2016  . Essential hypertension   . Chest pain 09/30/2015  . DVT, lower extremity ([place]) 06/18/2011  . DVT of upper extremity (deep vein thrombosis) ([place]) 06/18/2011  . [place] filter in place 06/18/2011  . History of stroke 06/18/2011  . PFO (patent foramen ovale) 06/18/2011  . Status post gastric bypass for obesity 06/18/2011  . PELVIC PAIN, CHRONIC 07/20/2008  . FIBROIDS, UTERUS 07/16/2008  . ASTHMA 07/16/2008  . FIBROMYALGIA 07/16/2008  . CARPAL TUNNEL SYNDROME, HX OF 07/16/2008  . History of pulmonary embolus ([place]) 07/16/2008    Sumner Boast., PT 05/28/2019, 5:36 PM  [address] [place], [name], [address] Phone: [phone]   Fax:  [phone]  Name: [name] MRN: [id number] Date of Birth: 28-Nov-1960

## 2019-06-02 ENCOUNTER — Ambulatory Visit: Payer: Medicare Other | Admitting: Physical Therapy

## 2019-06-02 ENCOUNTER — Other Ambulatory Visit: Payer: Self-pay

## 2019-06-02 ENCOUNTER — Encounter: Payer: Self-pay | Admitting: Physical Therapy

## 2019-06-02 DIAGNOSIS — M25611 Stiffness of right shoulder, not elsewhere classified: Secondary | ICD-10-CM

## 2019-06-02 DIAGNOSIS — M25511 Pain in right shoulder: Secondary | ICD-10-CM

## 2019-06-02 DIAGNOSIS — R600 Localized edema: Secondary | ICD-10-CM | POA: Diagnosis not present

## 2019-06-02 NOTE — Therapy (Signed)
[place], [name], [address] Phone: [phone]   Fax:  [phone]  Physical Therapy Treatment  Patient Details  Name: [name] MRN: [id number] Date of Birth: 19-Feb-1961 Referring Provider (PT): [name] Date: 06/02/2019  PT End of Session - 06/02/19 1732    Visit Number  15    Date for PT Re-Evaluation  07/04/19    PT Start Time  1654    PT Stop Time  1742    PT Time Calculation (min)  48 min    Activity Tolerance  Patient tolerated treatment well    Behavior During Therapy  [facility] for tasks assessed/performed       Past Medical History:  Diagnosis Date  . Anemia    since bypass  . Arthritis    osteoarthritis  . Asthma    states no asthma attack since 2002  . Back pain   . Breast cancer ([place]) 06/26/16 bx   left breast  . Breast cancer ([place])   . Chronic back pain   . Complication of anesthesia    states takes more than normal to put her to sleep  . Constipation   . Dental bridge present    upper  . Dental crowns present   . DVT of upper extremity (deep vein thrombosis) ([place])   . Fibromyalgia   . Genetic testing 09/19/2016   Ms. Gali underwent genetic counseling and testing for hereditary cancer syndromes on 08/21/2016. Her results were negative for mutations in all 46 genes analyzed by [employer]'s 46-gene Common Hereditary Cancers Panel. Genes analyzed include: APC, ATM, AXIN2, BARD1, BMPR1A, BRCA1, BRCA2, BRIP1, CDH1, CDKN2A, CHEK2, CTNNA1, DICER1, EPCAM, GREM1, HOXB13, KIT, MEN1, MLH1, MSH2, MSH3, MSH6, MUTYH, NBN,   . Headache(784.0)    migraines  . History of blood transfusion 06/2005  . History of gallstones   . History of pneumonia   . History of shingles 07/2011  . History of shingles   . HTN (hypertension)   . Itching   . Joint pain   . Muscle weakness   . Neuropathy   . Normal coronary arteries 2003  . Osteoarthropathy   . Personal history of  chemotherapy 2018  . Personal history of radiation therapy 2018  . PFO (patent foramen ovale)   . Presence of inferior vena cava filter   . Pulmonary embolism ([place])   . Sleep apnea    used CPAP until after bypass surg.  . Status post gastric bypass for obesity   . Stomach pain   . Stroke [facility]) 12/2002   right-sided weakness  . Urinary incontinence     Past Surgical History:  Procedure Laterality Date  . ABDOMINAL HYSTERECTOMY  11/1997   complete  . ANAL RECTAL MANOMETRY N/A 01/21/2019   Procedure: ANO RECTAL MANOMETRY;  Surgeon: Arta Silence, MD;  [facility];  Service: Endoscopy;  Laterality: N/A;  . ANTERIOR CERVICAL DECOMP/DISCECTOMY FUSION  02/05/2005   C5-6  . APPENDECTOMY  10/22/2008   laparoscopic  . BREAST LUMPECTOMY Left 07/2016  . BREAST LUMPECTOMY WITH RADIOACTIVE SEED AND SENTINEL LYMPH NODE BIOPSY Left 07/26/2016   Procedure: BREAST LUMPECTOMY WITH RADIOACTIVE SEED AND SENTINEL LYMPH NODE BIOPSY;  Surgeon: Erroll Luna, MD;  Location: [place];  Service: General;  Laterality: Left;  . BUNIONECTOMY  05/1980   both feet  . BUNIONECTOMY  08/2011   left foot  . CARDIAC CATHETERIZATION  03/04/2002  . CARPAL  TUNNEL RELEASE  06/21/2009   right  . CARPAL TUNNEL RELEASE     left hand  . CARPAL TUNNEL RELEASE  10/03/2011   Procedure: CARPAL TUNNEL RELEASE;  Surgeon: Wynonia Sours, MD;  Location: [place];  Service: Orthopedics;  Laterality: Right;  CARPAL TUNNEL WITH HYPOTHENAR FAT PAD TRANSFER  . CERVICAL SPINE SURGERY  01/2005   titanium plate implanted  . CHOLECYSTECTOMY  1990  . ELBOW SURGERY  08/09/2004   decompression ulnar nerve right elbow  . ENTEROLYSIS  10/22/2008   laparoscopic abd. enterolysis  . GASTRIC ROUX-EN-Y  2009  . HEEL SPUR SURGERY  08/1997   left  . HEMORRHOID SURGERY  03/1993  . LAPAROSCOPIC LYSIS INTESTINAL ADHESIONS  02/14/2000  . NAILBED REPAIR  01/10/2005; 08/2011   exc. matrix bilat. great toe  . OTHER  SURGICAL HISTORY  12/1986   pt states that she had surgery to unclog her fallopean tubes  . PORTACATH PLACEMENT N/A 09/24/2016   Procedure: INSERTION PORT-A-CATH WITH [name];  Surgeon: Erroll Luna, MD;  Location: [place];  Service: General;  Laterality: N/A;  . SHOULDER SURGERY     bilat. - (left:  06/2005)  . TONSILLECTOMY  07/1995  . TRIGGER FINGER RELEASE  04/25/2006   decompression A-1 pulley left thumb  . TRIGGER FINGER RELEASE Right 11/11/2018   Procedure: RELEASE TRIGGER FINGER/A-1 PULLEY RIGHT THUMB, RIGHT RING FINGER;  Surgeon: Daryll Brod, MD;  Location: [place];  Service: Orthopedics;  Laterality: Right;  . UTERINE FIBROID SURGERY  12/95, 7/96   x2  . VENA CAVA FILTER PLACEMENT  2009   during Roux-en-Y surg.    There were no vitals filed for this visit.  Subjective Assessment - 06/02/19 1656    Subjective  Patient continues to report increased soreness, she does not report that the exercises here are causing this she is reporting pain 8/10    Currently in Pain?  Yes    Pain Score  8     Pain Location  Shoulder    Pain Orientation  Right    Aggravating Factors   just soreness                       [facility] Adult PT Treatment/Exercise - 06/02/19 0001      Shoulder Exercises: Standing   Other Standing Exercises  biceps 3#, triceps 10# 2x10, ball walk up wall, approximation of ball against wall with small isometric circles      Shoulder Exercises: ROM/Strengthening   UBE (Upper Arm Bike)  level 3 x 6 minutes    Wall Wash  flexion both hands and then small cirles CW/CCW at chest high      Modalities   Modalities  Moist Heat      Moist Heat Therapy   Number Minutes Moist Heat  10 Minutes    Moist Heat Location  Cervical      Electrical Stimulation   Electrical Stimulation Location  right upper trap andshoulder    Electrical Stimulation Action  IFC    Electrical Stimulation Parameters  sitting    Electrical Stimulation Goals  Pain       Manual Therapy   Manual Therapy  Passive ROM;Soft tissue mobilization    Soft tissue mobilization  to the right upper trap and the right upper arm    Passive ROM  right shoulder to end ranges with light stretch some right UE neural tension stretches, gentle today lightly going to  her tolerance               PT Short Term Goals - 04/13/19 1734      PT SHORT TERM GOAL #1   Title  independent with initial HEP    Status  Achieved        PT Long Term Goals - 05/28/19 1735      PT LONG TERM GOAL #2   Title  increase AROM of the right shoulder flexion to 150 degrees    Status  Partially Met      PT LONG TERM GOAL #3   Title  report no difficulty with dressing or doing hair    Status  Partially Met            Plan - 06/02/19 1732    Clinical Impression Statement  Patient is frustrated today with pain levels, she rates pain an 8/10, reports no changes in activities, she reports that the last time she was here she felt like it was good without any issues, she reports pain in the right upper trap and the right upper arm.  She is tender in these areas and with spasms in the upper trap.  I backed off of all activites, exercises focused on AAROM    PT Next Visit Plan  see how her soreness is and alter accordingly    Consulted and Agree with Plan of Care  Patient       Patient will benefit from skilled therapeutic intervention in order to improve the following deficits and impairments:  Improper body mechanics, Pain, Increased muscle spasms, Postural dysfunction, Impaired UE functional use, Decreased strength, Decreased range of motion, Increased edema  Visit Diagnosis: Stiffness of right shoulder, not elsewhere classified  Acute pain of right shoulder  Localized edema     Problem List Patient Active Problem List   Diagnosis Date Noted  . Class 2 severe obesity with serious comorbidity and body mass index (BMI) of 37.0 to 37.9 in adult [facility]) 01/15/2019  . Status post  gastric surgery 07/29/2018  . Prediabetes 03/06/2018  . Vitamin D deficiency 02/18/2018  . Chemotherapy-induced peripheral neuropathy ([place]) 02/15/2017  . Port catheter in place 11/02/2016  . Genetic testing 09/19/2016  . Pre-operative clearance 07/23/2016  . Malignant neoplasm of lower-outer quadrant of left breast of female, estrogen receptor positive ([place]) 07/10/2016  . Essential hypertension   . Chest pain 09/30/2015  . DVT, lower extremity ([place]) 06/18/2011  . DVT of upper extremity (deep vein thrombosis) ([place]) 06/18/2011  . [place] filter in place 06/18/2011  . History of stroke 06/18/2011  . PFO (patent foramen ovale) 06/18/2011  . Status post gastric bypass for obesity 06/18/2011  . PELVIC PAIN, CHRONIC 07/20/2008  . FIBROIDS, UTERUS 07/16/2008  . ASTHMA 07/16/2008  . FIBROMYALGIA 07/16/2008  . CARPAL TUNNEL SYNDROME, HX OF 07/16/2008  . History of pulmonary embolus (PE) 07/16/2008    Sumner Boast., PT 06/02/2019, 5:38 PM  [place] [address] [place], [name], [address] Phone: [phone]   Fax:  [phone]  Name: [name] MRN: [id number] Date of Birth: [birth year]

## 2019-06-02 NOTE — Addendum Note (Signed)
Addended by: Sumner Boast on: 06/02/2019 05:46 PM   Modules accepted: Orders

## 2019-06-04 ENCOUNTER — Ambulatory Visit: Payer: Medicare Other | Admitting: Physical Therapy

## 2019-06-04 ENCOUNTER — Other Ambulatory Visit: Payer: Self-pay

## 2019-06-04 ENCOUNTER — Encounter: Payer: Self-pay | Admitting: Physical Therapy

## 2019-06-04 DIAGNOSIS — M25611 Stiffness of right shoulder, not elsewhere classified: Secondary | ICD-10-CM

## 2019-06-04 DIAGNOSIS — M25511 Pain in right shoulder: Secondary | ICD-10-CM | POA: Diagnosis not present

## 2019-06-04 DIAGNOSIS — R600 Localized edema: Secondary | ICD-10-CM | POA: Diagnosis not present

## 2019-06-04 NOTE — Therapy (Signed)
[address] [place], [name], [address] Phone: [phone]   Fax:  [phone]  Physical Therapy Treatment  Patient Details  Name: [name] MRN: [id number] Date of Birth: 02-Jul-1960 Referring Provider (PT): [name] Date: 06/04/2019  PT End of Session - 06/04/19 1739    Visit Number  16    Date for PT Re-Evaluation  07/04/19    PT Start Time  1657    PT Stop Time  1750    PT Time Calculation (min)  53 min    Activity Tolerance  Patient tolerated treatment well    Behavior During Therapy  [facility] for tasks assessed/performed       Past Medical History:  Diagnosis Date  . Anemia    since bypass  . Arthritis    osteoarthritis  . Asthma    states no asthma attack since 2002  . Back pain   . Breast cancer ([place]) 06/26/16 bx   left breast  . Breast cancer ([place])   . Chronic back pain   . Complication of anesthesia    states takes more than normal to put her to sleep  . Constipation   . Dental bridge present    upper  . Dental crowns present   . DVT of upper extremity (deep vein thrombosis) ([place])   . Fibromyalgia   . Genetic testing 09/19/2016   Ms. Winkel underwent genetic counseling and testing for hereditary cancer syndromes on 08/21/2016. Her results were negative for mutations in all 46 genes analyzed by [employer]'s 46-gene Common Hereditary Cancers Panel. Genes analyzed include: APC, ATM, AXIN2, BARD1, BMPR1A, BRCA1, BRCA2, BRIP1, CDH1, CDKN2A, CHEK2, CTNNA1, DICER1, EPCAM, GREM1, HOXB13, KIT, MEN1, MLH1, MSH2, MSH3, MSH6, MUTYH, NBN,   . Headache(784.0)    migraines  . History of blood transfusion 06/2005  . History of gallstones   . History of pneumonia   . History of shingles 07/2011  . History of shingles   . HTN (hypertension)   . Itching   . Joint pain   . Muscle weakness   . Neuropathy   . Normal coronary arteries 2003  . Osteoarthropathy   . Personal history of  chemotherapy 2018  . Personal history of radiation therapy 2018  . PFO (patent foramen ovale)   . Presence of inferior vena cava filter   . Pulmonary embolism ([place])   . Sleep apnea    used CPAP until after bypass surg.  . Status post gastric bypass for obesity   . Stomach pain   . Stroke [facility]) 12/2002   right-sided weakness  . Urinary incontinence     Past Surgical History:  Procedure Laterality Date  . ABDOMINAL HYSTERECTOMY  11/1997   complete  . ANAL RECTAL MANOMETRY N/A 01/21/2019   Procedure: ANO RECTAL MANOMETRY;  Surgeon: Arta Silence, MD;  [facility];  Service: Endoscopy;  Laterality: N/A;  . ANTERIOR CERVICAL DECOMP/DISCECTOMY FUSION  02/05/2005   C5-6  . APPENDECTOMY  10/22/2008   laparoscopic  . BREAST LUMPECTOMY Left 07/2016  . BREAST LUMPECTOMY WITH RADIOACTIVE SEED AND SENTINEL LYMPH NODE BIOPSY Left 07/26/2016   Procedure: BREAST LUMPECTOMY WITH RADIOACTIVE SEED AND SENTINEL LYMPH NODE BIOPSY;  Surgeon: Erroll Luna, MD;  Location: [place];  Service: General;  Laterality: Left;  . BUNIONECTOMY  05/1980   both feet  . BUNIONECTOMY  08/2011   left foot  . CARDIAC CATHETERIZATION  03/04/2002  . CARPAL  TUNNEL RELEASE  06/21/2009   right  . CARPAL TUNNEL RELEASE     left hand  . CARPAL TUNNEL RELEASE  10/03/2011   Procedure: CARPAL TUNNEL RELEASE;  Surgeon: Wynonia Sours, MD;  Location: [place];  Service: Orthopedics;  Laterality: Right;  CARPAL TUNNEL WITH HYPOTHENAR FAT PAD TRANSFER  . CERVICAL SPINE SURGERY  01/2005   titanium plate implanted  . CHOLECYSTECTOMY  1990  . ELBOW SURGERY  08/09/2004   decompression ulnar nerve right elbow  . ENTEROLYSIS  10/22/2008   laparoscopic abd. enterolysis  . GASTRIC ROUX-EN-Y  2009  . HEEL SPUR SURGERY  08/1997   left  . HEMORRHOID SURGERY  03/1993  . LAPAROSCOPIC LYSIS INTESTINAL ADHESIONS  02/14/2000  . NAILBED REPAIR  01/10/2005; 08/2011   exc. matrix bilat. great toe  . OTHER  SURGICAL HISTORY  12/1986   pt states that she had surgery to unclog her fallopean tubes  . PORTACATH PLACEMENT N/A 09/24/2016   Procedure: INSERTION PORT-A-CATH WITH [name];  Surgeon: Erroll Luna, MD;  Location: [place];  Service: General;  Laterality: N/A;  . SHOULDER SURGERY     bilat. - (left:  06/2005)  . TONSILLECTOMY  07/1995  . TRIGGER FINGER RELEASE  04/25/2006   decompression A-1 pulley left thumb  . TRIGGER FINGER RELEASE Right 11/11/2018   Procedure: RELEASE TRIGGER FINGER/A-1 PULLEY RIGHT THUMB, RIGHT RING FINGER;  Surgeon: Daryll Brod, MD;  Location: [place];  Service: Orthopedics;  Laterality: Right;  . UTERINE FIBROID SURGERY  12/95, 7/96   x2  . VENA CAVA FILTER PLACEMENT  2009   during Roux-en-Y surg.    There were no vitals filed for this visit.  Subjective Assessment - 06/04/19 1658    Subjective  Patient reports that she was quite a bit better after the last visit, still hurting but feeling better    Currently in Pain?  Yes    Pain Score  5     Pain Location  Shoulder    Pain Orientation  Right    Pain Descriptors / Indicators  Sore    Pain Relieving Factors  the massage helped                       [facility] Adult PT Treatment/Exercise - 06/04/19 0001      Shoulder Exercises: Supine   Other Supine Exercises  chest press, then flexion with cane small ROM , then 2# isometric circles, 2# serratus punch      Shoulder Exercises: Standing   Extension  Both;20 reps;Theraband    Theraband Level (Shoulder Extension)  Level 2 (Red)    Row  Both;20 reps;Theraband    Theraband Level (Shoulder Row)  Level 2 (Red)    Other Standing Exercises  biceps 3#, triceps 10# 2x10, ball walk up wall, approximation of ball against wall with small isometric circles      Shoulder Exercises: ROM/Strengthening   UBE (Upper Arm Bike)  level 3 x 6 minutes    Wall Wash  flexion both hands and then small cirles CW/CCW at chest high      Modalities   Modalities   Moist Heat      Moist Heat Therapy   Number Minutes Moist Heat  10 Minutes    Moist Heat Location  Cervical      Electrical Stimulation   Electrical Stimulation Location  right upper trap andshoulder    Electrical Stimulation Action  IFC  Electrical Stimulation Parameters  sitting    Electrical Stimulation Goals  Pain      Manual Therapy   Manual Therapy  Soft tissue mobilization    Soft tissue mobilization  to the right upper trap and the right upper arm and into the right cervical area               PT Short Term Goals - 04/13/19 1734      PT SHORT TERM GOAL #1   Title  independent with initial HEP    Status  Achieved        PT Long Term Goals - 06/04/19 1740      PT LONG TERM GOAL #1   Title  Patient will demonstrate proper technique with her home exercise program to improve shoulder ROM.    Status  Partially Met      PT LONG TERM GOAL #2   Title  increase AROM of the right shoulder flexion to 150 degrees    Status  Partially Met      PT LONG TERM GOAL #3   Title  report no difficulty with dressing or doing hair    Status  Partially Met      PT LONG TERM GOAL #4   Title  decrease pain 50%    Status  Partially Met      PT LONG TERM GOAL #5   Title  increase strength of the right shoulder to 4/5    Status  Partially Met            Plan - 06/04/19 1739    Clinical Impression Statement  Patient seems to be having less pain with [name] backing down on some of the exercises, she is still very tight in the upper trap on the right, I think from compensation.    PT Next Visit Plan  may try to use a mirror and work to a normal scapulothoracic and glenohumeral rythym    Consulted and Agree with Plan of Care  Patient       Patient will benefit from skilled therapeutic intervention in order to improve the following deficits and impairments:  Improper body mechanics, Pain, Increased muscle spasms, Postural dysfunction, Impaired UE functional use, Decreased  strength, Decreased range of motion, Increased edema  Visit Diagnosis: Stiffness of right shoulder, not elsewhere classified  Acute pain of right shoulder  Localized edema     Problem List Patient Active Problem List   Diagnosis Date Noted  . Class 2 severe obesity with serious comorbidity and body mass index (BMI) of 37.0 to 37.9 in adult [facility]) 01/15/2019  . Status post gastric surgery 07/29/2018  . Prediabetes 03/06/2018  . Vitamin D deficiency 02/18/2018  . Chemotherapy-induced peripheral neuropathy ([place]) 02/15/2017  . Port catheter in place 11/02/2016  . Genetic testing 09/19/2016  . Pre-operative clearance 07/23/2016  . Malignant neoplasm of lower-outer quadrant of left breast of female, estrogen receptor positive ([place]) 07/10/2016  . Essential hypertension   . Chest pain 09/30/2015  . DVT, lower extremity ([place]) 06/18/2011  . DVT of upper extremity (deep vein thrombosis) ([place]) 06/18/2011  . [place] filter in place 06/18/2011  . History of stroke 06/18/2011  . PFO (patent foramen ovale) 06/18/2011  . Status post gastric bypass for obesity 06/18/2011  . PELVIC PAIN, CHRONIC 07/20/2008  . FIBROIDS, UTERUS 07/16/2008  . ASTHMA 07/16/2008  . FIBROMYALGIA 07/16/2008  . CARPAL TUNNEL SYNDROME, HX OF 07/16/2008  . History of pulmonary embolus (PE) 07/16/2008  Madyn Ivins W.,PT 06/04/2019, 5:41 PM  [place], [name], [address] Phone: [phone]   Fax:  [phone]  Name: [name] MRN: [id number] Date of Birth: [birth year]

## 2019-06-09 ENCOUNTER — Encounter (INDEPENDENT_AMBULATORY_CARE_PROVIDER_SITE_OTHER): Payer: Self-pay | Admitting: Bariatrics

## 2019-06-09 ENCOUNTER — Other Ambulatory Visit: Payer: Self-pay

## 2019-06-09 ENCOUNTER — Ambulatory Visit: Payer: Medicare Other | Admitting: Physical Therapy

## 2019-06-09 ENCOUNTER — Encounter: Payer: Self-pay | Admitting: Physical Therapy

## 2019-06-09 ENCOUNTER — Ambulatory Visit (INDEPENDENT_AMBULATORY_CARE_PROVIDER_SITE_OTHER): Payer: Medicare Other | Admitting: Bariatrics

## 2019-06-09 VITALS — BP 115/72 | HR 59 | Temp 98.4°F | Ht 61.0 in | Wt 201.0 lb

## 2019-06-09 DIAGNOSIS — E559 Vitamin D deficiency, unspecified: Secondary | ICD-10-CM | POA: Diagnosis not present

## 2019-06-09 DIAGNOSIS — Z9889 Other specified postprocedural states: Secondary | ICD-10-CM

## 2019-06-09 DIAGNOSIS — M25511 Pain in right shoulder: Secondary | ICD-10-CM

## 2019-06-09 DIAGNOSIS — R600 Localized edema: Secondary | ICD-10-CM | POA: Diagnosis not present

## 2019-06-09 DIAGNOSIS — R7303 Prediabetes: Secondary | ICD-10-CM

## 2019-06-09 DIAGNOSIS — Z6838 Body mass index (BMI) 38.0-38.9, adult: Secondary | ICD-10-CM | POA: Diagnosis not present

## 2019-06-09 DIAGNOSIS — M25611 Stiffness of right shoulder, not elsewhere classified: Secondary | ICD-10-CM

## 2019-06-09 MED ORDER — VITAMIN D3 1.25 MG (50000 UT) PO CAPS: 1.0000 | ORAL_CAPSULE | ORAL | 0 refills | Status: DC

## 2019-06-09 MED FILL — VIT D3-50 50,000 UNITS CAPS: 1.25 MG | 56 days supply | Qty: 4 | Fill #0

## 2019-06-09 NOTE — Therapy (Signed)
[facility] [address] [place], [name], [address] Phone: [phone]   Fax:  [phone]  Physical Therapy Treatment  Patient Details  Name: [name] MRN: [id number] Date of Birth: Dec 26, 1960 Referring Provider (PT): [name] Date: 06/09/2019  PT End of Session - 06/09/19 1719    Visit Number  17    Date for PT Re-Evaluation  07/04/19    PT Start Time  1628    PT Stop Time  1725    PT Time Calculation (min)  57 min    Activity Tolerance  Patient tolerated treatment well    Behavior During Therapy  [facility] for tasks assessed/performed       Past Medical History:  Diagnosis Date  . Anemia    since bypass  . Arthritis    osteoarthritis  . Asthma    states no asthma attack since 2002  . Back pain   . Breast cancer ([place]) 06/26/16 bx   left breast  . Breast cancer ([place])   . Chronic back pain   . Complication of anesthesia    states takes more than normal to put her to sleep  . Constipation   . Dental bridge present    upper  . Dental crowns present   . DVT of upper extremity (deep vein thrombosis) ([place])   . Fibromyalgia   . Genetic testing 09/19/2016   Ms. Clune underwent genetic counseling and testing for hereditary cancer syndromes on 08/21/2016. Her results were negative for mutations in all 46 genes analyzed by [employer]'s 46-gene Common Hereditary Cancers Panel. Genes analyzed include: APC, ATM, AXIN2, BARD1, BMPR1A, BRCA1, BRCA2, BRIP1, CDH1, CDKN2A, CHEK2, CTNNA1, DICER1, EPCAM, GREM1, HOXB13, KIT, MEN1, MLH1, MSH2, MSH3, MSH6, MUTYH, NBN,   . Headache(784.0)    migraines  . History of blood transfusion 06/2005  . History of gallstones   . History of pneumonia   . History of shingles 07/2011  . History of shingles   . HTN (hypertension)   . Itching   . Joint pain   . Muscle weakness   . Neuropathy   . Normal coronary arteries 2003  . Osteoarthropathy   . Personal history of  chemotherapy 2018  . Personal history of radiation therapy 2018  . PFO (patent foramen ovale)   . Presence of inferior vena cava filter   . Pulmonary embolism ([place])   . Sleep apnea    used CPAP until after bypass surg.  . Status post gastric bypass for obesity   . Stomach pain   . Stroke [facility]) 12/2002   right-sided weakness  . Urinary incontinence     Past Surgical History:  Procedure Laterality Date  . ABDOMINAL HYSTERECTOMY  11/1997   complete  . ANAL RECTAL MANOMETRY N/A 01/21/2019   Procedure: ANO RECTAL MANOMETRY;  Surgeon: Arta Silence, MD;  Location: [facility];  Service: Endoscopy;  Laterality: N/A;  . ANTERIOR CERVICAL DECOMP/DISCECTOMY FUSION  02/05/2005   C5-6  . APPENDECTOMY  10/22/2008   laparoscopic  . BREAST LUMPECTOMY Left 07/2016  . BREAST LUMPECTOMY WITH RADIOACTIVE SEED AND SENTINEL LYMPH NODE BIOPSY Left 07/26/2016   Procedure: BREAST LUMPECTOMY WITH RADIOACTIVE SEED AND SENTINEL LYMPH NODE BIOPSY;  Surgeon: Erroll Luna, MD;  Location: [place];  Service: General;  Laterality: Left;  . BUNIONECTOMY  05/1980   both feet  . BUNIONECTOMY  08/2011   left foot  . CARDIAC CATHETERIZATION  03/04/2002  . CARPAL  TUNNEL RELEASE  06/21/2009   right  . CARPAL TUNNEL RELEASE     left hand  . CARPAL TUNNEL RELEASE  10/03/2011   Procedure: CARPAL TUNNEL RELEASE;  Surgeon: Wynonia Sours, MD;  Location: [place];  Service: Orthopedics;  Laterality: Right;  CARPAL TUNNEL WITH HYPOTHENAR FAT PAD TRANSFER  . CERVICAL SPINE SURGERY  01/2005   titanium plate implanted  . CHOLECYSTECTOMY  1990  . ELBOW SURGERY  08/09/2004   decompression ulnar nerve right elbow  . ENTEROLYSIS  10/22/2008   laparoscopic abd. enterolysis  . GASTRIC ROUX-EN-Y  2009  . HEEL SPUR SURGERY  08/1997   left  . HEMORRHOID SURGERY  03/1993  . LAPAROSCOPIC LYSIS INTESTINAL ADHESIONS  02/14/2000  . NAILBED REPAIR  01/10/2005; 08/2011   exc. matrix bilat. great toe  . OTHER  SURGICAL HISTORY  12/1986   pt states that she had surgery to unclog her fallopean tubes  . PORTACATH PLACEMENT N/A 09/24/2016   Procedure: INSERTION PORT-A-CATH WITH [name];  Surgeon: Erroll Luna, MD;  Location: [place];  Service: General;  Laterality: N/A;  . SHOULDER SURGERY     bilat. - (left:  06/2005)  . TONSILLECTOMY  07/1995  . TRIGGER FINGER RELEASE  04/25/2006   decompression A-1 pulley left thumb  . TRIGGER FINGER RELEASE Right 11/11/2018   Procedure: RELEASE TRIGGER FINGER/A-1 PULLEY RIGHT THUMB, RIGHT RING FINGER;  Surgeon: Daryll Brod, MD;  Location: [place];  Service: Orthopedics;  Laterality: Right;  . UTERINE FIBROID SURGERY  12/95, 7/96   x2  . VENA CAVA FILTER PLACEMENT  2009   during Roux-en-Y surg.    There were no vitals filed for this visit.  Subjective Assessment - 06/09/19 1628    Subjective  I think i am doing a little better    Currently in Pain?  Yes    Pain Score  4     Pain Location  Shoulder    Pain Orientation  Right    Pain Descriptors / Indicators  Sore                       [facility] Adult PT Treatment/Exercise - 06/09/19 0001      Shoulder Exercises: Supine   Other Supine Exercises  chest press, then flexion with cane small ROM , then 2# isometric circles, 2# serratus punch      Shoulder Exercises: Standing   External Rotation  Both;20 reps;Theraband    Theraband Level (Shoulder External Rotation)  Level 1 (Yellow)    Internal Rotation  Left;20 reps;Right;Theraband    Theraband Level (Shoulder Internal Rotation)  Level 2 (Red)    Extension  Both;20 reps;Theraband    Theraband Level (Shoulder Extension)  Level 2 (Red)    Row  Both;20 reps;Theraband    Theraband Level (Shoulder Row)  Level 2 (Red)    Other Standing Exercises  biceps 3#, triceps 10# 2x10, ball walk up wall, approximation of ball against wall with small isometric circles      Shoulder Exercises: ROM/Strengthening   UBE (Upper Arm Bike)  level 3 x 6  minutes    Wall Wash  flexion and circles right hand/arm only      Modalities   Modalities  Moist Heat      Moist Heat Therapy   Number Minutes Moist Heat  10 Minutes    Moist Heat Location  Cervical      Electrical Stimulation   Electrical Stimulation Location  right upper trap Therapist, music  IFC    Electrical Stimulation Parameters  sitting    Electrical Stimulation Goals  Pain      Manual Therapy   Manual Therapy  Soft tissue mobilization    Soft tissue mobilization  to the right upper trap and the right upper arm and into the right cervical area               PT Short Term Goals - 04/13/19 1734      PT SHORT TERM GOAL #1   Title  independent with initial HEP    Status  Achieved        PT Long Term Goals - 06/04/19 1740      PT LONG TERM GOAL #1   Title  Patient will demonstrate proper technique with her home exercise program to improve shoulder ROM.    Status  Partially Met      PT LONG TERM GOAL #2   Title  increase AROM of the right shoulder flexion to 150 degrees    Status  Partially Met      PT LONG TERM GOAL #3   Title  report no difficulty with dressing or doing hair    Status  Partially Met      PT LONG TERM GOAL #4   Title  decrease pain 50%    Status  Partially Met      PT LONG TERM GOAL #5   Title  increase strength of the right shoulder to 4/5    Status  Partially Met            Plan - 06/09/19 1720    Clinical Impression Statement  Patient has very weak legs, she wobbles when she walks, has difficulty getting up from sititng, using a lot of arm push, and has to use her arms to lift her legs up onto the bed.  I may try to start some easy leg exercises as this may decreased some strain on th earms    PT Next Visit Plan  may try to use a mirror and work to a normal scapulothoracic and glenohumeral rythym    Consulted and Agree with Plan of Care  Patient       Patient will benefit from skilled  therapeutic intervention in order to improve the following deficits and impairments:  Improper body mechanics, Pain, Increased muscle spasms, Postural dysfunction, Impaired UE functional use, Decreased strength, Decreased range of motion, Increased edema  Visit Diagnosis: Stiffness of right shoulder, not elsewhere classified  Acute pain of right shoulder  Localized edema     Problem List Patient Active Problem List   Diagnosis Date Noted  . Class 2 severe obesity with serious comorbidity and body mass index (BMI) of 37.0 to 37.9 in adult [facility]) 01/15/2019  . Status post gastric surgery 07/29/2018  . Prediabetes 03/06/2018  . Vitamin D deficiency 02/18/2018  . Chemotherapy-induced peripheral neuropathy ([place]) 02/15/2017  . Port catheter in place 11/02/2016  . Genetic testing 09/19/2016  . Pre-operative clearance 07/23/2016  . Malignant neoplasm of lower-outer quadrant of left breast of female, estrogen receptor positive ([place]) 07/10/2016  . Essential hypertension   . Chest pain 09/30/2015  . DVT, lower extremity ([place]) 06/18/2011  . DVT of upper extremity (deep vein thrombosis) ([place]) 06/18/2011  . [place] filter in place 06/18/2011  . History of stroke 06/18/2011  . PFO (patent foramen ovale) 06/18/2011  . Status post gastric bypass for obesity  06/18/2011  . PELVIC PAIN, CHRONIC 07/20/2008  . FIBROIDS, UTERUS 07/16/2008  . ASTHMA 07/16/2008  . FIBROMYALGIA 07/16/2008  . CARPAL TUNNEL SYNDROME, HX OF 07/16/2008  . History of pulmonary embolus ([place]) 07/16/2008    Sumner Boast., PT 06/09/2019, 5:21 PM  [address] [place], [name], [address] Phone: [phone]   Fax:  [phone]  Name: [name] MRN: [id number] Date of Birth: 09-24-1960

## 2019-06-09 NOTE — Progress Notes (Signed)
Chief Complaint:   OBESITY [name] is here to discuss her progress with her obesity treatment plan along with follow-up of her obesity related diagnoses. [name] is on the Category 2 Plan and states she is following her eating plan approximately 25% of the time. [name] states she is walking 20 minutes 4-5 times per week.  Today's visit was #: 18 Starting weight: 204 lbs Starting date: 02/18/2018 Today's weight: 201 lbs Today's date: 06/09/2019 Total lbs lost to date: 3 Total lbs lost since last in-office visit: 0  Interim History: [name] is up 3 lbs.  Subjective:   Vitamin D deficiency. No nausea vomiting, or muscle weakness.  Status post gastric surgery. Charl is taking Vitamin, multivitamin with minerals.  Prediabetes. [name] has a diagnosis of prediabetes based on her elevated HgA1c and was informed this puts her at greater risk of developing diabetes. She continues to work on diet and exercise to decrease her risk of diabetes. She denies nausea or hypoglycemia. No polyphagia. No appetite.  Lab Results  Component Value Date   HGBA1C 5.6 12/17/2018   Lab Results  Component Value Date   INSULIN 4.9 02/18/2018   Assessment/Plan:   Vitamin D deficiency. Low Vitamin D level contributes to fatigue and are associated with obesity, breast, and colon cancer. She agrees to continue to take prescription Cholecalciferol (VITAMIN D3) 1.25 MG (50000 UT) CAPS every 14 days #2 with 0 refills and will have routine Vitamin B12, VITAMIN D 25 Hydroxy (Vit-D Deficiency, Fractures), and Vitamin B1 levels checked.  Status post gastric surgery. [name] is at risk for malnutrition due to her previous bariatric surgery. She will continue vitamins. B12, VITAMIN D 25 Hydroxy (Vit-D Deficiency, Fractures), Vitamin B1 labs ordered.  Counseling  You may need to eat 3 meals and 2 snacks, or 5 small meals each day in order to reach your protein and calorie goals.   Allow  at least 15 minutes for each meal so that you can eat mindfully. Listen to your body so that you do not overeat. For most people, your sleeve or pouch will comfortably hold 4-6 ounces.  Eat foods from all food groups. This includes fruits and vegetables, grains, dairy, and meat and other proteins.  Include a protein-rich food at every meal and snack, and eat the protein food first.   You should be taking a Bariatric Multivitamin as well as calcium.   Prediabetes. [name] will continue to work on weight loss, exercise, increasing protein and healthy fats, and decreasing simple carbohydrates to help decrease the risk of diabetes.  Insulin, random, Hemoglobin A1c, CBC with Differential/Platelet, Comprehensive metabolic panel, Vitamin B1 labs ordered.   Class 2 severe obesity with serious comorbidity and body mass index (BMI) of 38.0 to 38.9 in adult, unspecified obesity type ([place]).  [name] is currently in the action stage of change. As such, her goal is to continue with weight loss efforts. She has agreed to the Category 2 Plan alternating with the Pescatarian plan.  She will work on meal planning, intentional eating, and be more adherent to the plan.  Exercise goals: [name] has not been consistent with exercise since her shoulder surgery.  Behavioral modification strategies: increasing lean protein intake, decreasing simple carbohydrates, increasing vegetables, increasing water intake, decreasing eating out, no skipping meals, meal planning and cooking strategies, keeping healthy foods in the home and planning for success.  [name] has agreed to follow-up with our clinic in 2 months. She was informed of the importance of  frequent follow-up visits to maximize her success with intensive lifestyle modifications for her multiple health conditions.   [name] was informed we would discuss her lab results at her next visit unless there is a critical issue that needs to be addressed sooner.  [name] agreed to keep her next visit at the agreed upon time to discuss these results.  Objective:   Blood pressure 115/72, pulse (!) 59, temperature 98.4 F (36.9 C), temperature source Oral, height 5\' 1"  (1.549 m), weight 201 lb (91.2 kg), SpO2 97 %. Body mass index is 37.98 kg/m.  General: Cooperative, alert, well developed, in no acute distress. HEENT: Conjunctivae and lids unremarkable. Cardiovascular: Regular rhythm.  Lungs: Normal work of breathing. Neurologic: No focal deficits. She is walking with a cane for ambulation.  Lab Results  Component Value Date   CREATININE 0.68 12/17/2018   BUN 14 12/17/2018   NA 140 12/17/2018   K 4.0 12/17/2018   CL 102 12/17/2018   CO2 20 12/17/2018   Lab Results  Component Value Date   ALT 14 12/17/2018   AST 16 12/17/2018   ALKPHOS 102 12/17/2018   BILITOT 0.5 12/17/2018   Lab Results  Component Value Date   HGBA1C 5.6 12/17/2018   HGBA1C 5.7 (H) 06/10/2018   HGBA1C 5.8 (H) 02/18/2018   Lab Results  Component Value Date   INSULIN 4.9 02/18/2018   Lab Results  Component Value Date   TSH 0.802 02/18/2018   Lab Results  Component Value Date   CHOL 210 (H) 11/07/2018   HDL 84 11/07/2018   LDLCALC 114 (H) 11/07/2018   TRIG 61 11/07/2018   CHOLHDL 2.5 11/07/2018   Lab Results  Component Value Date   WBC 4.3 11/26/2017   HGB 11.5 (L) 11/26/2017   HCT 34.8 11/26/2017   MCV 96.1 11/26/2017   PLT 192 11/26/2017   No results found for: IRON, TIBC, FERRITIN  Obesity Behavioral Intervention Documentation for Insurance:   Approximately 15 minutes were spent on the discussion below.  ASK: We discussed the diagnosis of obesity with [name] today and [name] agreed to give [name] permission to discuss obesity behavioral modification therapy today.  ASSESS: [name] has the diagnosis of obesity and her BMI today is 38.1. [name] is in the action stage of change.   ADVISE: [name] was educated on the multiple health  risks of obesity as well as the benefit of weight loss to improve her health. She was advised of the need for long term treatment and the importance of lifestyle modifications to improve her current health and to decrease her risk of future health problems.  AGREE: Multiple dietary modification options and treatment options were discussed and Sniyah agreed to follow the recommendations documented in the above note.  ARRANGE: Sulema was educated on the importance of frequent visits to treat obesity as outlined per CMS and USPSTF guidelines and agreed to schedule her next follow up appointment today.  Attestation Statements:   Reviewed by clinician on day of visit: allergies, medications, problem list, medical history, surgical history, family history, social history, and previous encounter notes.  Migdalia Dk, am acting as Location manager for [employer], DO   I have reviewed the above documentation for accuracy and completeness, and I agree with the above. Jearld Lesch, DO

## 2019-06-10 DIAGNOSIS — M810 Age-related osteoporosis without current pathological fracture: Secondary | ICD-10-CM | POA: Diagnosis not present

## 2019-06-10 DIAGNOSIS — I10 Essential (primary) hypertension: Secondary | ICD-10-CM | POA: Diagnosis not present

## 2019-06-10 DIAGNOSIS — C50919 Malignant neoplasm of unspecified site of unspecified female breast: Secondary | ICD-10-CM | POA: Diagnosis not present

## 2019-06-10 DIAGNOSIS — D649 Anemia, unspecified: Secondary | ICD-10-CM | POA: Diagnosis not present

## 2019-06-11 ENCOUNTER — Encounter: Payer: Self-pay | Admitting: Physical Therapy

## 2019-06-11 ENCOUNTER — Other Ambulatory Visit: Payer: Self-pay

## 2019-06-11 ENCOUNTER — Ambulatory Visit: Payer: Medicare Other | Admitting: Physical Therapy

## 2019-06-11 DIAGNOSIS — M25511 Pain in right shoulder: Secondary | ICD-10-CM | POA: Diagnosis not present

## 2019-06-11 DIAGNOSIS — R600 Localized edema: Secondary | ICD-10-CM | POA: Diagnosis not present

## 2019-06-11 DIAGNOSIS — M25611 Stiffness of right shoulder, not elsewhere classified: Secondary | ICD-10-CM | POA: Diagnosis not present

## 2019-06-11 NOTE — Therapy (Signed)
[name], [name], [address] Phone: [phone]   Fax:  [phone]  Physical Therapy Treatment  Patient Details  Name: [name] MRN: [id number] Date of Birth: [birth year] Referring Provider (PT): [name] Date: 06/11/2019  PT End of Session - 06/11/19 1731    Visit Number  18    Date for PT Re-Evaluation  07/04/19    PT Start Time  1655   heat   PT Stop Time  1747    PT Time Calculation (min)  52 min    Activity Tolerance  Patient tolerated treatment well    Behavior During Therapy  [facility] for tasks assessed/performed       Past Medical History:  Diagnosis Date  . Anemia    since bypass  . Arthritis    osteoarthritis  . Asthma    states no asthma attack since 2002  . Back pain   . Breast cancer ([place]) 06/26/16 bx   left breast  . Breast cancer ([place])   . Chronic back pain   . Complication of anesthesia    states takes more than normal to put her to sleep  . Constipation   . Dental bridge present    upper  . Dental crowns present   . DVT of upper extremity (deep vein thrombosis) ([place])   . Fibromyalgia   . Genetic testing 09/19/2016   [name] underwent genetic counseling and testing for hereditary cancer syndromes on 08/21/2016. Her results were negative for mutations in all 46 genes analyzed by [employer]'s 46-gene Common Hereditary Cancers Panel. Genes analyzed include: APC, ATM, AXIN2, BARD1, BMPR1A, BRCA1, BRCA2, BRIP1, CDH1, CDKN2A, CHEK2, CTNNA1, DICER1, EPCAM, GREM1, HOXB13, KIT, MEN1, MLH1, MSH2, MSH3, MSH6, MUTYH, NBN,   . Headache(784.0)    migraines  . History of blood transfusion 06/2005  . History of gallstones   . History of pneumonia   . History of shingles 07/2011  . History of shingles   . HTN (hypertension)   . Itching   . Joint pain   . Muscle weakness   . Neuropathy   . Normal coronary arteries 2003  . Osteoarthropathy   . Personal history  of chemotherapy 2018  . Personal history of radiation therapy 2018  . PFO (patent foramen ovale)   . Presence of inferior vena cava filter   . Pulmonary embolism ([place])   . Sleep apnea    used CPAP until after bypass surg.  . Status post gastric bypass for obesity   . Stomach pain   . Stroke [facility]) 12/2002   right-sided weakness  . Urinary incontinence     Past Surgical History:  Procedure Laterality Date  . ABDOMINAL HYSTERECTOMY  11/1997   complete  . ANAL RECTAL MANOMETRY N/A 01/21/2019   Procedure: ANO RECTAL MANOMETRY;  Surgeon: Arta Silence, MD;  [facility];  Service: Endoscopy;  Laterality: N/A;  . ANTERIOR CERVICAL DECOMP/DISCECTOMY FUSION  02/05/2005   C5-6  . APPENDECTOMY  10/22/2008   laparoscopic  . BREAST LUMPECTOMY Left 07/2016  . BREAST LUMPECTOMY WITH RADIOACTIVE SEED AND SENTINEL LYMPH NODE BIOPSY Left 07/26/2016   Procedure: BREAST LUMPECTOMY WITH RADIOACTIVE SEED AND SENTINEL LYMPH NODE BIOPSY;  Surgeon: Erroll Luna, MD;  Location: [place];  Service: General;  Laterality: Left;  . BUNIONECTOMY  05/1980   both feet  . BUNIONECTOMY  08/2011   left foot  . CARDIAC CATHETERIZATION  03/04/2002  .  CARPAL TUNNEL RELEASE  06/21/2009   right  . CARPAL TUNNEL RELEASE     left hand  . CARPAL TUNNEL RELEASE  10/03/2011   Procedure: CARPAL TUNNEL RELEASE;  Surgeon: Wynonia Sours, MD;  Location: [place];  Service: Orthopedics;  Laterality: Right;  CARPAL TUNNEL WITH HYPOTHENAR FAT PAD TRANSFER  . CERVICAL SPINE SURGERY  01/2005   titanium plate implanted  . CHOLECYSTECTOMY  1990  . ELBOW SURGERY  08/09/2004   decompression ulnar nerve right elbow  . ENTEROLYSIS  10/22/2008   laparoscopic abd. enterolysis  . GASTRIC ROUX-EN-Y  2009  . HEEL SPUR SURGERY  08/1997   left  . HEMORRHOID SURGERY  03/1993  . LAPAROSCOPIC LYSIS INTESTINAL ADHESIONS  02/14/2000  . NAILBED REPAIR  01/10/2005; 08/2011   exc. matrix bilat. great toe  . OTHER  SURGICAL HISTORY  12/1986   pt states that she had surgery to unclog her fallopean tubes  . PORTACATH PLACEMENT N/A 09/24/2016   Procedure: INSERTION PORT-A-CATH WITH [name];  Surgeon: Erroll Luna, MD;  Location: [place];  Service: General;  Laterality: N/A;  . SHOULDER SURGERY     bilat. - (left:  06/2005)  . TONSILLECTOMY  07/1995  . TRIGGER FINGER RELEASE  04/25/2006   decompression A-1 pulley left thumb  . TRIGGER FINGER RELEASE Right 11/11/2018   Procedure: RELEASE TRIGGER FINGER/A-1 PULLEY RIGHT THUMB, RIGHT RING FINGER;  Surgeon: Daryll Brod, MD;  Location: [place];  Service: Orthopedics;  Laterality: Right;  . UTERINE FIBROID SURGERY  12/95, 7/96   x2  . VENA CAVA FILTER PLACEMENT  2009   during Roux-en-Y surg.    There were no vitals filed for this visit.  Subjective Assessment - 06/11/19 1701    Subjective  I was pretty sore yesterday, but feel better today    Currently in Pain?  Yes    Pain Score  4     Pain Location  Shoulder    Pain Orientation  Right    Aggravating Factors   exercises         [facility] PT Assessment - 06/11/19 0001      AROM   Right Shoulder Flexion  115 Degrees    Right Shoulder Internal Rotation  56 Degrees    Right Shoulder External Rotation  70 Degrees      PROM   Right Shoulder Flexion  150 Degrees    Right Shoulder Internal Rotation  70 Degrees    Right Shoulder External Rotation  90 Degrees                   [facility] Adult PT Treatment/Exercise - 06/11/19 0001      Shoulder Exercises: Standing   Internal Rotation Limitations  with wand behind back    Flexion Limitations  with wand sliding up the door frame    Extension Limitations  with wand behind back 3# on wand    Other Standing Exercises  biceps 3# on wand, then 3# on wand extension    Other Standing Exercises  small weighted ball pass around the waist both directions x 10 each      Shoulder Exercises: ROM/Strengthening   UBE (Upper Arm Bike)  level 3 x 6  minutes    Lat Pull  20 reps    Lat Pull Limitations  10#    Cybex Row  20 reps    Cybex Row Limitations  20#    [place]  flexion and circles  right hand/arm only    Other ROM/Strengthening Exercises  15# leg flexion x10, no weight leg extension x10    Other ROM/Strengthening Exercises  triceps 10# x10      Shoulder Exercises: Stretch   Corner Stretch  3 reps;10 seconds    Corner Stretch Limitations  in doorway      Moist Heat Therapy   Number Minutes Moist Heat  10 Minutes    Moist Heat Location  Cervical               PT Short Term Goals - 04/13/19 1734      PT SHORT TERM GOAL #1   Title  independent with initial HEP    Status  Achieved        PT Long Term Goals - 06/11/19 1733      PT LONG TERM GOAL #2   Title  increase AROM of the right shoulder flexion to 150 degrees    Status  Partially Met      PT LONG TERM GOAL #4   Title  decrease pain 50%    Status  Partially Met      PT LONG TERM GOAL #5   Title  increase strength of the right shoulder to 4/5    Status  Partially Met            Plan - 06/11/19 1732    Clinical Impression Statement  Patient reports that she is feeling better, she reports still difficulty doing hair, difficulty with cooking and reaching across are painful and weak and very limited.  Her ROM is improving, when getting on and off some of the machines she does not use the right arm at all, holds it in a sling position    PT Next Visit Plan  try to continue to work on her function    Consulted and Agree with Plan of Care  Patient       Patient will benefit from skilled therapeutic intervention in order to improve the following deficits and impairments:  Improper body mechanics, Pain, Increased muscle spasms, Postural dysfunction, Impaired UE functional use, Decreased strength, Decreased range of motion, Increased edema  Visit Diagnosis: Stiffness of right shoulder, not elsewhere classified  Acute pain of right  shoulder  Localized edema     Problem List Patient Active Problem List   Diagnosis Date Noted  . Class 2 severe obesity with serious comorbidity and body mass index (BMI) of 37.0 to 37.9 in adult [facility]) 01/15/2019  . Status post gastric surgery 07/29/2018  . Prediabetes 03/06/2018  . Vitamin D deficiency 02/18/2018  . Chemotherapy-induced peripheral neuropathy ([place]) 02/15/2017  . Port catheter in place 11/02/2016  . Genetic testing 09/19/2016  . Pre-operative clearance 07/23/2016  . Malignant neoplasm of lower-outer quadrant of left breast of female, estrogen receptor positive ([place]) 07/10/2016  . Essential hypertension   . Chest pain 09/30/2015  . DVT, lower extremity ([place]) 06/18/2011  . DVT of upper extremity (deep vein thrombosis) ([place]) 06/18/2011  . [place] filter in place 06/18/2011  . History of stroke 06/18/2011  . PFO (patent foramen ovale) 06/18/2011  . Status post gastric bypass for obesity 06/18/2011  . PELVIC PAIN, CHRONIC 07/20/2008  . FIBROIDS, UTERUS 07/16/2008  . ASTHMA 07/16/2008  . FIBROMYALGIA 07/16/2008  . CARPAL TUNNEL SYNDROME, HX OF 07/16/2008  . History of pulmonary embolus (PE) 07/16/2008    Sumner Boast., PT 06/11/2019, 5:34 PM  [place], [name], [address] Phone: [phone]   Fax:  [phone]  Name: Kindel Rochefort MRN: [id number] Date of Birth: 1960-05-22

## 2019-06-13 LAB — COMPREHENSIVE METABOLIC PANEL
ALT: 8 IU/L (ref 0–32)
AST: 17 IU/L (ref 0–40)
Albumin/Globulin Ratio: 1.7 (ref 1.2–2.2)
Albumin: 4.8 g/dL (ref 3.8–4.9)
Alkaline Phosphatase: 99 IU/L (ref 39–117)
BUN/Creatinine Ratio: 19 (ref 9–23)
BUN: 16 mg/dL (ref 6–24)
Bilirubin Total: 0.4 mg/dL (ref 0.0–1.2)
CO2: 25 mmol/L (ref 20–29)
Calcium: 10 mg/dL (ref 8.7–10.2)
Chloride: 102 mmol/L (ref 96–106)
Creatinine, Ser: 0.84 mg/dL (ref 0.57–1.00)
GFR calc Af Amer: 89 mL/min/{1.73_m2} (ref >59)
GFR calc non Af Amer: 77 mL/min/{1.73_m2} (ref >59)
Globulin, Total: 2.8 g/dL (ref 1.5–4.5)
Glucose: 83 mg/dL (ref 65–99)
Potassium: 5 mmol/L (ref 3.5–5.2)
Sodium: 142 mmol/L (ref 134–144)
Total Protein: 7.6 g/dL (ref 6.0–8.5)

## 2019-06-13 LAB — CBC WITH DIFFERENTIAL/PLATELET
Basophils Absolute: 0 10*3/uL (ref 0.0–0.2)
Basos: 1 %
EOS (ABSOLUTE): 0.1 10*3/uL (ref 0.0–0.4)
Eos: 2 %
Hematocrit: 36.8 % (ref 34.0–46.6)
Hemoglobin: 12.2 g/dL (ref 11.1–15.9)
Immature Grans (Abs): 0 10*3/uL (ref 0.0–0.1)
Immature Granulocytes: 0 %
Lymphocytes Absolute: 1.6 10*3/uL (ref 0.7–3.1)
Lymphs: 36 %
MCH: 31.5 pg (ref 26.6–33.0)
MCHC: 33.2 g/dL (ref 31.5–35.7)
MCV: 95 fL (ref 79–97)
Monocytes Absolute: 0.4 10*3/uL (ref 0.1–0.9)
Monocytes: 9 %
Neutrophils Absolute: 2.4 10*3/uL (ref 1.4–7.0)
Neutrophils: 52 %
Platelets: 234 10*3/uL (ref 150–450)
RBC: 3.87 x10E6/uL (ref 3.77–5.28)
RDW: 12.9 % (ref 11.7–15.4)
WBC: 4.5 10*3/uL (ref 3.4–10.8)

## 2019-06-13 LAB — VITAMIN B1: Thiamine: 98 nmol/L (ref 66.5–200.0)

## 2019-06-13 LAB — HEMOGLOBIN A1C
Est. average glucose Bld gHb Est-mCnc: 111 mg/dL
Hgb A1c MFr Bld: 5.5 % (ref 4.8–5.6)

## 2019-06-13 LAB — INSULIN, RANDOM: INSULIN: 7 u[IU]/mL (ref 2.6–24.9)

## 2019-06-13 LAB — VITAMIN D 25 HYDROXY (VIT D DEFICIENCY, FRACTURES): Vit D, 25-Hydroxy: 40.6 ng/mL (ref 30.0–100.0)

## 2019-06-13 LAB — VITAMIN B12: Vitamin B-12: 317 pg/mL (ref 232–1245)

## 2019-06-16 ENCOUNTER — Ambulatory Visit: Payer: Medicare Other | Admitting: Physical Therapy

## 2019-06-16 ENCOUNTER — Encounter: Payer: Self-pay | Admitting: Physical Therapy

## 2019-06-16 ENCOUNTER — Other Ambulatory Visit: Payer: Self-pay

## 2019-06-16 DIAGNOSIS — M25511 Pain in right shoulder: Secondary | ICD-10-CM | POA: Diagnosis not present

## 2019-06-16 DIAGNOSIS — R600 Localized edema: Secondary | ICD-10-CM | POA: Diagnosis not present

## 2019-06-16 DIAGNOSIS — M25611 Stiffness of right shoulder, not elsewhere classified: Secondary | ICD-10-CM | POA: Diagnosis not present

## 2019-06-16 MED FILL — hydrOXYzine HCL 10 MG TABS: 10 | 30 days supply | Qty: 30 | Fill #0

## 2019-06-16 NOTE — Therapy (Signed)
[place], [name], [address] Phone: [phone]   Fax:  [phone]  Physical Therapy Treatment  Patient Details  Name: Colleena Kurtenbach MRN: [id number] Date of Birth: 06-10-1960 Referring Provider (PT): [name] Date: 06/16/2019  PT End of Session - 06/16/19 1741    Visit Number  19    Date for PT Re-Evaluation  07/04/19    PT Start Time  1654    PT Stop Time  1749    PT Time Calculation (min)  55 min    Activity Tolerance  Patient tolerated treatment well;Patient limited by pain    Behavior During Therapy  [facility] for tasks assessed/performed       Past Medical History:  Diagnosis Date  . Anemia    since bypass  . Arthritis    osteoarthritis  . Asthma    states no asthma attack since 2002  . Back pain   . Breast cancer ([place]) 06/26/16 bx   left breast  . Breast cancer ([place])   . Chronic back pain   . Complication of anesthesia    states takes more than normal to put her to sleep  . Constipation   . Dental bridge present    upper  . Dental crowns present   . DVT of upper extremity (deep vein thrombosis) ([place])   . Fibromyalgia   . Genetic testing 09/19/2016   Ms. Dinapoli underwent genetic counseling and testing for hereditary cancer syndromes on 08/21/2016. Her results were negative for mutations in all 46 genes analyzed by [employer]'s 46-gene Common Hereditary Cancers Panel. Genes analyzed include: APC, ATM, AXIN2, BARD1, BMPR1A, BRCA1, BRCA2, BRIP1, CDH1, CDKN2A, CHEK2, CTNNA1, DICER1, EPCAM, GREM1, HOXB13, KIT, MEN1, MLH1, MSH2, MSH3, MSH6, MUTYH, NBN,   . Headache(784.0)    migraines  . History of blood transfusion 06/2005  . History of gallstones   . History of pneumonia   . History of shingles 07/2011  . History of shingles   . HTN (hypertension)   . Itching   . Joint pain   . Muscle weakness   . Neuropathy   . Normal coronary arteries 2003  . Osteoarthropathy   .  Personal history of chemotherapy 2018  . Personal history of radiation therapy 2018  . PFO (patent foramen ovale)   . Presence of inferior vena cava filter   . Pulmonary embolism ([place])   . Sleep apnea    used CPAP until after bypass surg.  . Status post gastric bypass for obesity   . Stomach pain   . Stroke [facility]) 12/2002   right-sided weakness  . Urinary incontinence     Past Surgical History:  Procedure Laterality Date  . ABDOMINAL HYSTERECTOMY  11/1997   complete  . ANAL RECTAL MANOMETRY N/A 01/21/2019   Procedure: ANO RECTAL MANOMETRY;  Surgeon: Arta Silence, MD;  [facility];  Service: Endoscopy;  Laterality: N/A;  . ANTERIOR CERVICAL DECOMP/DISCECTOMY FUSION  02/05/2005   C5-6  . APPENDECTOMY  10/22/2008   laparoscopic  . BREAST LUMPECTOMY Left 07/2016  . BREAST LUMPECTOMY WITH RADIOACTIVE SEED AND SENTINEL LYMPH NODE BIOPSY Left 07/26/2016   Procedure: BREAST LUMPECTOMY WITH RADIOACTIVE SEED AND SENTINEL LYMPH NODE BIOPSY;  Surgeon: Erroll Luna, MD;  Location: [place];  Service: General;  Laterality: Left;  . BUNIONECTOMY  05/1980   both feet  . BUNIONECTOMY  08/2011   left foot  . CARDIAC CATHETERIZATION  03/04/2002  .  CARPAL TUNNEL RELEASE  06/21/2009   right  . CARPAL TUNNEL RELEASE     left hand  . CARPAL TUNNEL RELEASE  10/03/2011   Procedure: CARPAL TUNNEL RELEASE;  Surgeon: Wynonia Sours, MD;  Location: [place];  Service: Orthopedics;  Laterality: Right;  CARPAL TUNNEL WITH HYPOTHENAR FAT PAD TRANSFER  . CERVICAL SPINE SURGERY  01/2005   titanium plate implanted  . CHOLECYSTECTOMY  1990  . ELBOW SURGERY  08/09/2004   decompression ulnar nerve right elbow  . ENTEROLYSIS  10/22/2008   laparoscopic abd. enterolysis  . GASTRIC ROUX-EN-Y  2009  . HEEL SPUR SURGERY  08/1997   left  . HEMORRHOID SURGERY  03/1993  . LAPAROSCOPIC LYSIS INTESTINAL ADHESIONS  02/14/2000  . NAILBED REPAIR  01/10/2005; 08/2011   exc. matrix bilat. great  toe  . OTHER SURGICAL HISTORY  12/1986   pt states that she had surgery to unclog her fallopean tubes  . PORTACATH PLACEMENT N/A 09/24/2016   Procedure: INSERTION PORT-A-CATH WITH [name];  Surgeon: Erroll Luna, MD;  Location: [place];  Service: General;  Laterality: N/A;  . SHOULDER SURGERY     bilat. - (left:  06/2005)  . TONSILLECTOMY  07/1995  . TRIGGER FINGER RELEASE  04/25/2006   decompression A-1 pulley left thumb  . TRIGGER FINGER RELEASE Right 11/11/2018   Procedure: RELEASE TRIGGER FINGER/A-1 PULLEY RIGHT THUMB, RIGHT RING FINGER;  Surgeon: Daryll Brod, MD;  Location: [place];  Service: Orthopedics;  Laterality: Right;  . UTERINE FIBROID SURGERY  12/95, 7/96   x2  . VENA CAVA FILTER PLACEMENT  2009   during Roux-en-Y surg.    There were no vitals filed for this visit.  Subjective Assessment - 06/16/19 1700    Subjective  Patient reports that she started having some soreness on Saturday all around the right shoulder, "feels like I sprained it", she rreports that she does not think there was anything that she did just more pain    Currently in Pain?  Yes    Pain Score  7     Pain Location  Shoulder    Pain Orientation  Right    Pain Descriptors / Indicators  Sore    Aggravating Factors   unsure of why this new pain                       [facility] Adult PT Treatment/Exercise - 06/16/19 0001      Shoulder Exercises: Supine   Internal Rotation  20 reps;Theraband    Theraband Level (Shoulder Internal Rotation)  Level 2 (Red)    Other Supine Exercises  chest press, then flexion with cane small ROM , then 2# isometric circles, 2# serratus punch      Shoulder Exercises: Standing   Internal Rotation Limitations  with wand behind back    Extension Limitations  with wand behind back 3# on wand      Shoulder Exercises: ROM/Strengthening   Nustep  level 1 x 5 minutes      Shoulder Exercises: Isometric Strengthening   Extension  5X10"    External Rotation   5X10"    Internal Rotation  5X10"    ADduction  5X10"      Moist Heat Therapy   Number Minutes Moist Heat  10 Minutes    Moist Heat Location  Cervical      Electrical Stimulation   Electrical Stimulation Location  right upper trap andshoulder  Electrical Stimulation Action  IFC    Electrical Stimulation Parameters  sitting    Electrical Stimulation Goals  Pain               PT Short Term Goals - 04/13/19 1734      PT SHORT TERM GOAL #1   Title  independent with initial HEP    Status  Achieved        PT Long Term Goals - 06/11/19 1733      PT LONG TERM GOAL #2   Title  increase AROM of the right shoulder flexion to 150 degrees    Status  Partially Met      PT LONG TERM GOAL #4   Title  decrease pain 50%    Status  Partially Met      PT LONG TERM GOAL #5   Title  increase strength of the right shoulder to 4/5    Status  Partially Met            Plan - 06/16/19 1742    Clinical Impression Statement  Patinet reports increased pain today, she is unsure of why the pain, does not remember doing anything different.  The shoulder moves well she has more pain i the shoulder girdle mms and some pain in the lateral right shoulder.  I backed off today and did less strength, she tolerated this well without increase of pain    PT Next Visit Plan  see if her pain comes back down    Consulted and Agree with Plan of Care  Patient       Patient will benefit from skilled therapeutic intervention in order to improve the following deficits and impairments:  Improper body mechanics, Pain, Increased muscle spasms, Postural dysfunction, Impaired UE functional use, Decreased strength, Decreased range of motion, Increased edema  Visit Diagnosis: Stiffness of right shoulder, not elsewhere classified  Acute pain of right shoulder  Localized edema     Problem List Patient Active Problem List   Diagnosis Date Noted  . Class 2 severe obesity with serious comorbidity and  body mass index (BMI) of 37.0 to 37.9 in adult [facility]) 01/15/2019  . Status post gastric surgery 07/29/2018  . Prediabetes 03/06/2018  . Vitamin D deficiency 02/18/2018  . Chemotherapy-induced peripheral neuropathy ([place]) 02/15/2017  . Port catheter in place 11/02/2016  . Genetic testing 09/19/2016  . Pre-operative clearance 07/23/2016  . Malignant neoplasm of lower-outer quadrant of left breast of female, estrogen receptor positive ([place]) 07/10/2016  . Essential hypertension   . Chest pain 09/30/2015  . DVT, lower extremity ([place]) 06/18/2011  . DVT of upper extremity (deep vein thrombosis) ([place]) 06/18/2011  . Greenfield filter in place 06/18/2011  . History of stroke 06/18/2011  . PFO (patent foramen ovale) 06/18/2011  . Status post gastric bypass for obesity 06/18/2011  . PELVIC PAIN, CHRONIC 07/20/2008  . FIBROIDS, UTERUS 07/16/2008  . ASTHMA 07/16/2008  . FIBROMYALGIA 07/16/2008  . CARPAL TUNNEL SYNDROME, HX OF 07/16/2008  . History of pulmonary embolus (PE) 07/16/2008    Sumner Boast., PT 06/16/2019, 5:44 PM  [place] [address] [place], [name], [address] Phone: [phone]   Fax:  [phone]  Name: Suetta Hoffmeister MRN: [id number] Date of Birth: Sep 11, 1960

## 2019-06-17 ENCOUNTER — Telehealth: Payer: Self-pay | Admitting: Hematology and Oncology

## 2019-06-17 MED FILL — VIT D3-50 50,000 UNITS CAPS: 1.25 MG | 56 days supply | Qty: 4 | Fill #0

## 2019-06-17 NOTE — Telephone Encounter (Signed)
Rescheduled per MD. Hulen Skains and left a msg. Mailing printout

## 2019-06-18 ENCOUNTER — Other Ambulatory Visit: Payer: Self-pay

## 2019-06-18 ENCOUNTER — Encounter: Payer: Self-pay | Admitting: Physical Therapy

## 2019-06-18 ENCOUNTER — Ambulatory Visit: Payer: Medicare Other | Admitting: Physical Therapy

## 2019-06-18 DIAGNOSIS — M25511 Pain in right shoulder: Secondary | ICD-10-CM

## 2019-06-18 DIAGNOSIS — R600 Localized edema: Secondary | ICD-10-CM | POA: Diagnosis not present

## 2019-06-18 DIAGNOSIS — M25611 Stiffness of right shoulder, not elsewhere classified: Secondary | ICD-10-CM

## 2019-06-18 NOTE — Therapy (Signed)
[address] [place], [name], [address] Phone: [phone]   Fax:  [phone] Progress Note Reporting Period 05/21/19 to 06/18/19 for visits 11-20  See note below for Objective Data and Assessment of Progress/Goals.      Physical Therapy Treatment  Patient Details  Name: [name] MRN: [id number] Date of Birth: [birth year] Referring Provider (PT): [name] Date: 06/18/2019  PT End of Session - 06/18/19 1740    Visit Number  20    Date for PT Re-Evaluation  07/04/19    PT Start Time  1655    PT Stop Time  1741    PT Time Calculation (min)  46 min    Activity Tolerance  Patient tolerated treatment well;Patient limited by pain    Behavior During Therapy  [facility] for tasks assessed/performed       Past Medical History:  Diagnosis Date  . Anemia    since bypass  . Arthritis    osteoarthritis  . Asthma    states no asthma attack since 2002  . Back pain   . Breast cancer ([place]) 06/26/16 bx   left breast  . Breast cancer ([place])   . Chronic back pain   . Complication of anesthesia    states takes more than normal to put her to sleep  . Constipation   . Dental bridge present    upper  . Dental crowns present   . DVT of upper extremity (deep vein thrombosis) ([place])   . Fibromyalgia   . Genetic testing 09/19/2016   Ms. Largo underwent genetic counseling and testing for hereditary cancer syndromes on 08/21/2016. Her results were negative for mutations in all 46 genes analyzed by [employer]'s 46-gene Common Hereditary Cancers Panel. Genes analyzed include: APC, ATM, AXIN2, BARD1, BMPR1A, BRCA1, BRCA2, BRIP1, CDH1, CDKN2A, CHEK2, CTNNA1, DICER1, EPCAM, GREM1, HOXB13, KIT, MEN1, MLH1, MSH2, MSH3, MSH6, MUTYH, NBN,   . Headache(784.0)    migraines  . History of blood transfusion 06/2005  . History of gallstones   . History of pneumonia   . History of shingles 07/2011  . History of shingles   .  HTN (hypertension)   . Itching   . Joint pain   . Muscle weakness   . Neuropathy   . Normal coronary arteries 2003  . Osteoarthropathy   . Personal history of chemotherapy 2018  . Personal history of radiation therapy 2018  . PFO (patent foramen ovale)   . Presence of inferior vena cava filter   . Pulmonary embolism ([place])   . Sleep apnea    used CPAP until after bypass surg.  . Status post gastric bypass for obesity   . Stomach pain   . Stroke [facility]) 12/2002   right-sided weakness  . Urinary incontinence     Past Surgical History:  Procedure Laterality Date  . ABDOMINAL HYSTERECTOMY  11/1997   complete  . ANAL RECTAL MANOMETRY N/A 01/21/2019   Procedure: ANO RECTAL MANOMETRY;  Surgeon: Arta Silence, MD;  Location[facility];  Service: Endoscopy;  Laterality: N/A;  . ANTERIOR CERVICAL DECOMP/DISCECTOMY FUSION  02/05/2005   C5-6  . APPENDECTOMY  10/22/2008   laparoscopic  . BREAST LUMPECTOMY Left 07/2016  . BREAST LUMPECTOMY WITH RADIOACTIVE SEED AND SENTINEL LYMPH NODE BIOPSY Left 07/26/2016   Procedure: BREAST LUMPECTOMY WITH RADIOACTIVE SEED AND SENTINEL LYMPH NODE BIOPSY;  Surgeon: Erroll Luna, MD;  Location: [place];  Service: General;  Laterality:  Left;  . BUNIONECTOMY  05/1980   both feet  . BUNIONECTOMY  08/2011   left foot  . CARDIAC CATHETERIZATION  03/04/2002  . CARPAL TUNNEL RELEASE  06/21/2009   right  . CARPAL TUNNEL RELEASE     left hand  . CARPAL TUNNEL RELEASE  10/03/2011   Procedure: CARPAL TUNNEL RELEASE;  Surgeon: Wynonia Sours, MD;  Location: [place];  Service: Orthopedics;  Laterality: Right;  CARPAL TUNNEL WITH HYPOTHENAR FAT PAD TRANSFER  . CERVICAL SPINE SURGERY  01/2005   titanium plate implanted  . CHOLECYSTECTOMY  1990  . ELBOW SURGERY  08/09/2004   decompression ulnar nerve right elbow  . ENTEROLYSIS  10/22/2008   laparoscopic abd. enterolysis  . GASTRIC ROUX-EN-Y  2009  . HEEL SPUR SURGERY  08/1997   left  .  HEMORRHOID SURGERY  03/1993  . LAPAROSCOPIC LYSIS INTESTINAL ADHESIONS  02/14/2000  . NAILBED REPAIR  01/10/2005; 08/2011   exc. matrix bilat. great toe  . OTHER SURGICAL HISTORY  12/1986   pt states that she had surgery to unclog her fallopean tubes  . PORTACATH PLACEMENT N/A 09/24/2016   Procedure: INSERTION PORT-A-CATH WITH [name];  Surgeon: Erroll Luna, MD;  Location: [place];  Service: General;  Laterality: N/A;  . SHOULDER SURGERY     bilat. - (left:  06/2005)  . TONSILLECTOMY  07/1995  . TRIGGER FINGER RELEASE  04/25/2006   decompression A-1 pulley left thumb  . TRIGGER FINGER RELEASE Right 11/11/2018   Procedure: RELEASE TRIGGER FINGER/A-1 PULLEY RIGHT THUMB, RIGHT RING FINGER;  Surgeon: Daryll Brod, MD;  Location: [place];  Service: Orthopedics;  Laterality: Right;  . UTERINE FIBROID SURGERY  12/95, 7/96   x2  . VENA CAVA FILTER PLACEMENT  2009   during Roux-en-Y surg.    There were no vitals filed for this visit.  Subjective Assessment - 06/18/19 1701    Subjective  Patient reports that she has continued to have pain 4-8/10 she is unsure of why the increased pain, has pain in the right lateral arm with reaching up, reports reaching behind the back is with less pain.  She reports sleeping better and that she can move it better    Currently in Pain?  Yes    Pain Score  5     Pain Location  Shoulder    Pain Orientation  Right;Lateral    Aggravating Factors   unsure of why the new increase of pain         [facility] PT Assessment - 06/18/19 0001      AROM   Right Shoulder Flexion  120 Degrees    Right Shoulder Internal Rotation  56 Degrees    Right Shoulder External Rotation  71 Degrees      PROM   Right Shoulder Flexion  155 Degrees    Right Shoulder Internal Rotation  72 Degrees    Right Shoulder External Rotation  90 Degrees                   [facility] Adult PT Treatment/Exercise - 06/18/19 0001      Shoulder Exercises: Seated   Other Seated  Exercises  flexion with mirror to decrease upper trap compensation      Shoulder Exercises: Standing   External Rotation  Both;20 reps;Theraband    Theraband Level (Shoulder External Rotation)  Level 1 (Yellow)    Internal Rotation Limitations  with wand behind back    Extension  Both;20  reps;Theraband    Theraband Level (Shoulder Extension)  Level 2 (Red)    Extension Limitations  with wand behind back     Other Standing Exercises  biceps with wand      Shoulder Exercises: ROM/Strengthening   Nustep  level 1 x 5 minutes    Wall Wash  flexion and circles at face high cw/ccw      Manual Therapy   Manual Therapy  Passive ROM    Passive ROM  to end range flexion ER/IR some cues to relax               PT Short Term Goals - 04/13/19 1734      PT SHORT TERM GOAL #1   Title  independent with initial HEP    Status  Achieved        PT Long Term Goals - 06/18/19 1743      PT LONG TERM GOAL #1   Title  Patient will demonstrate proper technique with her home exercise program to improve shoulder ROM.    Status  Partially Met      PT LONG TERM GOAL #2   Title  increase AROM of the right shoulder flexion to 150 degrees    Status  Partially Met      PT LONG TERM GOAL #3   Title  report no difficulty with dressing or doing hair    Status  Partially Met      PT LONG TERM GOAL #4   Title  decrease pain 50%    Status  Partially Met      PT LONG TERM GOAL #5   Title  increase strength of the right shoulder to 4/5    Status  Partially Met            Plan - 06/18/19 1740    Clinical Impression Statement  Patient has reported some incresaed pain over the past past 5 days, she is unsure of why the increased pain.  Her ROM actively continues to improve.  She has had instances of upper trap compensation, she was able to do better with less compensation today.  She tends to have pain in the lateral arm recentlywith reaching out.  She has not returned to driving and worries  that she cannot reach out fast enough    PT Next Visit Plan  I have followed Dr. Veverly Fells' protocol no abduction, she is improving but with increased pain recently.  Please advise if you would like me to do anything different    Consulted and Agree with Plan of Care  Patient       Patient will benefit from skilled therapeutic intervention in order to improve the following deficits and impairments:  Improper body mechanics, Pain, Increased muscle spasms, Postural dysfunction, Impaired UE functional use, Decreased strength, Decreased range of motion, Increased edema  Visit Diagnosis: Stiffness of right shoulder, not elsewhere classified  Acute pain of right shoulder  Localized edema     Problem List Patient Active Problem List   Diagnosis Date Noted  . Class 2 severe obesity with serious comorbidity and body mass index (BMI) of 37.0 to 37.9 in adult [facility]) 01/15/2019  . Status post gastric surgery 07/29/2018  . Prediabetes 03/06/2018  . Vitamin D deficiency 02/18/2018  . Chemotherapy-induced peripheral neuropathy ([place]) 02/15/2017  . Port catheter in place 11/02/2016  . Genetic testing 09/19/2016  . Pre-operative clearance 07/23/2016  . Malignant neoplasm of lower-outer quadrant of left breast of female, estrogen receptor  positive ([place]) 07/10/2016  . Essential hypertension   . Chest pain 09/30/2015  . DVT, lower extremity ([place]) 06/18/2011  . DVT of upper extremity (deep vein thrombosis) ([place]) 06/18/2011  . [place] filter in place 06/18/2011  . History of stroke 06/18/2011  . PFO (patent foramen ovale) 06/18/2011  . Status post gastric bypass for obesity 06/18/2011  . PELVIC PAIN, CHRONIC 07/20/2008  . FIBROIDS, UTERUS 07/16/2008  . ASTHMA 07/16/2008  . FIBROMYALGIA 07/16/2008  . CARPAL TUNNEL SYNDROME, HX OF 07/16/2008  . History of pulmonary embolus (PE) 07/16/2008    Sumner Boast., PT 06/18/2019, 5:45 PM  [address] [place], [name], [address] Phone: [phone]   Fax:  [phone]  Name: Ari Engelbrecht MRN: [id number] Date of Birth: 09/16/1960

## 2019-06-23 ENCOUNTER — Ambulatory Visit: Payer: Medicare Other | Admitting: Physical Therapy

## 2019-06-23 DIAGNOSIS — M47816 Spondylosis without myelopathy or radiculopathy, lumbar region: Secondary | ICD-10-CM | POA: Diagnosis not present

## 2019-06-23 DIAGNOSIS — M5106 Intervertebral disc disorders with myelopathy, lumbar region: Secondary | ICD-10-CM | POA: Diagnosis not present

## 2019-06-23 DIAGNOSIS — Z6841 Body Mass Index (BMI) 40.0 and over, adult: Secondary | ICD-10-CM | POA: Diagnosis not present

## 2019-06-23 DIAGNOSIS — I10 Essential (primary) hypertension: Secondary | ICD-10-CM | POA: Diagnosis not present

## 2019-06-25 ENCOUNTER — Encounter: Payer: Self-pay | Admitting: Physical Therapy

## 2019-06-25 ENCOUNTER — Ambulatory Visit: Payer: Medicare Other | Attending: Orthopedic Surgery | Admitting: Physical Therapy

## 2019-06-25 ENCOUNTER — Other Ambulatory Visit: Payer: Self-pay

## 2019-06-25 DIAGNOSIS — M25511 Pain in right shoulder: Secondary | ICD-10-CM | POA: Diagnosis not present

## 2019-06-25 DIAGNOSIS — M25611 Stiffness of right shoulder, not elsewhere classified: Secondary | ICD-10-CM

## 2019-06-25 NOTE — Therapy (Signed)
[address] [place], [name], [address] Phone: [phone]   Fax:  [phone]  Physical Therapy Treatment  Patient Details  Name: [name] MRN: [id number] Date of Birth: 09/01/1960 Referring Provider (PT): [name] Date: 06/25/2019  PT End of Session - 06/25/19 1352    Visit Number  21    Date for PT Re-Evaluation  07/04/19    PT Start Time  3500    PT Stop Time  1410    PT Time Calculation (min)  57 min    Activity Tolerance  Patient tolerated treatment well;Patient limited by pain    Behavior During Therapy  [facility] for tasks assessed/performed       Past Medical History:  Diagnosis Date  . Anemia    since bypass  . Arthritis    osteoarthritis  . Asthma    states no asthma attack since 2002  . Back pain   . Breast cancer ([place]) 06/26/16 bx   left breast  . Breast cancer ([place])   . Chronic back pain   . Complication of anesthesia    states takes more than normal to put her to sleep  . Constipation   . Dental bridge present    upper  . Dental crowns present   . DVT of upper extremity (deep vein thrombosis) ([place])   . Fibromyalgia   . Genetic testing 09/19/2016   Ms. Briddell underwent genetic counseling and testing for hereditary cancer syndromes on 08/21/2016. Her results were negative for mutations in all 46 genes analyzed by [employer]'s 46-gene Common Hereditary Cancers Panel. Genes analyzed include: APC, ATM, AXIN2, BARD1, BMPR1A, BRCA1, BRCA2, BRIP1, CDH1, CDKN2A, CHEK2, CTNNA1, DICER1, EPCAM, GREM1, HOXB13, KIT, MEN1, MLH1, MSH2, MSH3, MSH6, MUTYH, NBN,   . Headache(784.0)    migraines  . History of blood transfusion 06/2005  . History of gallstones   . History of pneumonia   . History of shingles 07/2011  . History of shingles   . HTN (hypertension)   . Itching   . Joint pain   . Muscle weakness   . Neuropathy   . Normal coronary arteries 2003  . Osteoarthropathy   .  Personal history of chemotherapy 2018  . Personal history of radiation therapy 2018  . PFO (patent foramen ovale)   . Presence of inferior vena cava filter   . Pulmonary embolism ([place])   . Sleep apnea    used CPAP until after bypass surg.  . Status post gastric bypass for obesity   . Stomach pain   . Stroke [facility]) 12/2002   right-sided weakness  . Urinary incontinence     Past Surgical History:  Procedure Laterality Date  . ABDOMINAL HYSTERECTOMY  11/1997   complete  . ANAL RECTAL MANOMETRY N/A 01/21/2019   Procedure: ANO RECTAL MANOMETRY;  Surgeon: Arta Silence, MD;  [facility];  Service: Endoscopy;  Laterality: N/A;  . ANTERIOR CERVICAL DECOMP/DISCECTOMY FUSION  02/05/2005   C5-6  . APPENDECTOMY  10/22/2008   laparoscopic  . BREAST LUMPECTOMY Left 07/2016  . BREAST LUMPECTOMY WITH RADIOACTIVE SEED AND SENTINEL LYMPH NODE BIOPSY Left 07/26/2016   Procedure: BREAST LUMPECTOMY WITH RADIOACTIVE SEED AND SENTINEL LYMPH NODE BIOPSY;  Surgeon: Erroll Luna, MD;  Location: [place];  Service: General;  Laterality: Left;  . BUNIONECTOMY  05/1980   both feet  . BUNIONECTOMY  08/2011   left foot  . CARDIAC CATHETERIZATION  03/04/2002  .  CARPAL TUNNEL RELEASE  06/21/2009   right  . CARPAL TUNNEL RELEASE     left hand  . CARPAL TUNNEL RELEASE  10/03/2011   Procedure: CARPAL TUNNEL RELEASE;  Surgeon: Wynonia Sours, MD;  Location: [place];  Service: Orthopedics;  Laterality: Right;  CARPAL TUNNEL WITH HYPOTHENAR FAT PAD TRANSFER  . CERVICAL SPINE SURGERY  01/2005   titanium plate implanted  . CHOLECYSTECTOMY  1990  . ELBOW SURGERY  08/09/2004   decompression ulnar nerve right elbow  . ENTEROLYSIS  10/22/2008   laparoscopic abd. enterolysis  . GASTRIC ROUX-EN-Y  2009  . HEEL SPUR SURGERY  08/1997   left  . HEMORRHOID SURGERY  03/1993  . LAPAROSCOPIC LYSIS INTESTINAL ADHESIONS  02/14/2000  . NAILBED REPAIR  01/10/2005; 08/2011   exc. matrix bilat. great  toe  . OTHER SURGICAL HISTORY  12/1986   pt states that she had surgery to unclog her fallopean tubes  . PORTACATH PLACEMENT N/A 09/24/2016   Procedure: INSERTION PORT-A-CATH WITH [name];  Surgeon: Erroll Luna, MD;  Location: [place];  Service: General;  Laterality: N/A;  . SHOULDER SURGERY     bilat. - (left:  06/2005)  . TONSILLECTOMY  07/1995  . TRIGGER FINGER RELEASE  04/25/2006   decompression A-1 pulley left thumb  . TRIGGER FINGER RELEASE Right 11/11/2018   Procedure: RELEASE TRIGGER FINGER/A-1 PULLEY RIGHT THUMB, RIGHT RING FINGER;  Surgeon: Daryll Brod, MD;  Location: [place];  Service: Orthopedics;  Laterality: Right;  . UTERINE FIBROID SURGERY  12/95, 7/96   x2  . VENA CAVA FILTER PLACEMENT  2009   during Roux-en-Y surg.    There were no vitals filed for this visit.  Subjective Assessment - 06/25/19 1316    Subjective  Saw surgeon on Tuesday, feels like she is doing well "ahead of the game" .  Will decreased to 1x/week and start a little more strength if tolerated    Currently in Pain?  Yes    Pain Score  3     Pain Location  Shoulder    Pain Orientation  Right    Aggravating Factors   using the right arm                       [facility] Adult PT Treatment/Exercise - 06/25/19 0001      Shoulder Exercises: Supine   Other Supine Exercises  chest press, then flexion with cane and 2.5# , then 2# isometric circles, 2# serratus punch      Shoulder Exercises: Standing   External Rotation  Both;20 reps;Theraband    Theraband Level (Shoulder External Rotation)  Level 2 (Red)    Internal Rotation  Left;20 reps;Right;Theraband    Theraband Level (Shoulder Internal Rotation)  Level 2 (Red)    Internal Rotation Limitations  with wand a 3# as well    Extension  Both;20 reps;Theraband    Theraband Level (Shoulder Extension)  Level 2 (Red)    Extension Limitations  with wand and 3# as well    Row  Both;20 reps;Theraband    Theraband Level (Shoulder Row)   Level 2 (Red)    Other Standing Exercises  biceps with wand and 3#      Shoulder Exercises: ROM/Strengthening   UBE (Upper Arm Bike)  level 3 x 4 minutes    Nustep  level 4 x 5 minutes    Wall Wash  flexion and circles at face high cw/ccw  Shoulder Exercises: Isometric Strengthening   Extension  5X10"    External Rotation  5X10"    Internal Rotation  5X10"    ADduction  5X10"      Moist Heat Therapy   Number Minutes Moist Heat  10 Minutes    Moist Heat Location  Cervical      Electrical Stimulation   Electrical Stimulation Location  right upper trap andshoulder    Electrical Stimulation Action  IFC    Electrical Stimulation Parameters  supine    Electrical Stimulation Goals  Pain               PT Short Term Goals - 04/13/19 1734      PT SHORT TERM GOAL #1   Title  independent with initial HEP    Status  Achieved        PT Long Term Goals - 06/25/19 1357      PT LONG TERM GOAL #2   Title  increase AROM of the right shoulder flexion to 150 degrees    Status  Partially Met      PT LONG TERM GOAL #3   Title  report no difficulty with dressing or doing hair    Status  Partially Met            Plan - 06/25/19 1354    Clinical Impression Statement  Patient  saw the MD, he was pleased with her ROM, feels like her pain is normal and will fade over the next couple of weeks, asked that we decrease to 1x/week and slowly work on functional strength    PT Next Visit Plan  slowly start strength    Consulted and Agree with Plan of Care  Patient       Patient will benefit from skilled therapeutic intervention in order to improve the following deficits and impairments:  Improper body mechanics, Pain, Increased muscle spasms, Postural dysfunction, Impaired UE functional use, Decreased strength, Decreased range of motion, Increased edema  Visit Diagnosis: Stiffness of right shoulder, not elsewhere classified     Problem List Patient Active Problem List    Diagnosis Date Noted  . Class 2 severe obesity with serious comorbidity and body mass index (BMI) of 37.0 to 37.9 in adult [facility]) 01/15/2019  . Status post gastric surgery 07/29/2018  . Prediabetes 03/06/2018  . Vitamin D deficiency 02/18/2018  . Chemotherapy-induced peripheral neuropathy ([place]) 02/15/2017  . Port catheter in place 11/02/2016  . Genetic testing 09/19/2016  . Pre-operative clearance 07/23/2016  . Malignant neoplasm of lower-outer quadrant of left breast of female, estrogen receptor positive ([place]) 07/10/2016  . Essential hypertension   . Chest pain 09/30/2015  . DVT, lower extremity ([place]) 06/18/2011  . DVT of upper extremity (deep vein thrombosis) ([place]) 06/18/2011  . Greenfield filter in place 06/18/2011  . History of stroke 06/18/2011  . PFO (patent foramen ovale) 06/18/2011  . Status post gastric bypass for obesity 06/18/2011  . PELVIC PAIN, CHRONIC 07/20/2008  . FIBROIDS, UTERUS 07/16/2008  . ASTHMA 07/16/2008  . FIBROMYALGIA 07/16/2008  . CARPAL TUNNEL SYNDROME, HX OF 07/16/2008  . History of pulmonary embolus ([place]) 07/16/2008    Sumner Boast., PT 06/25/2019, 1:58 PM  [address] [place], [name], [address] Phone: [phone]   Fax:  [phone]  Name: [name] MRN: [id number] Date of Birth: 1961-04-06

## 2019-06-27 ENCOUNTER — Other Ambulatory Visit: Payer: Self-pay | Admitting: Orthopaedic Surgery

## 2019-06-27 DIAGNOSIS — M47816 Spondylosis without myelopathy or radiculopathy, lumbar region: Secondary | ICD-10-CM

## 2019-06-30 ENCOUNTER — Ambulatory Visit: Payer: Medicare Other | Admitting: Physical Therapy

## 2019-07-02 ENCOUNTER — Other Ambulatory Visit: Payer: Self-pay

## 2019-07-02 ENCOUNTER — Ambulatory Visit: Payer: Medicare Other | Admitting: Physical Therapy

## 2019-07-02 ENCOUNTER — Encounter: Payer: Self-pay | Admitting: Physical Therapy

## 2019-07-02 DIAGNOSIS — M25611 Stiffness of right shoulder, not elsewhere classified: Secondary | ICD-10-CM | POA: Diagnosis not present

## 2019-07-02 DIAGNOSIS — M25511 Pain in right shoulder: Secondary | ICD-10-CM

## 2019-07-02 MED FILL — LETROZOLE 2.5 MG TABS: 2.5 | 90 days supply | Qty: 90 | Fill #1

## 2019-07-02 NOTE — Therapy (Signed)
[place], [name], [address] Phone: [phone]   Fax:  [phone]  Physical Therapy Treatment  Patient Details  Name: [name] MRN: [id number] Date of Birth: 06-23-1960 Referring Provider (PT): [name] Date: 07/02/2019  PT End of Session - 07/02/19 1342    Visit Number  22    Date for PT Re-Evaluation  07/04/19    PT Start Time  1306    PT Stop Time  9163    PT Time Calculation (min)  49 min    Activity Tolerance  Patient tolerated treatment well    Behavior During Therapy  [facility] for tasks assessed/performed       Past Medical History:  Diagnosis Date  . Anemia    since bypass  . Arthritis    osteoarthritis  . Asthma    states no asthma attack since 2002  . Back pain   . Breast cancer ([place]) 06/26/16 bx   left breast  . Breast cancer ([place])   . Chronic back pain   . Complication of anesthesia    states takes more than normal to put her to sleep  . Constipation   . Dental bridge present    upper  . Dental crowns present   . DVT of upper extremity (deep vein thrombosis) ([place])   . Fibromyalgia   . Genetic testing 09/19/2016   Ms. Kubicek underwent genetic counseling and testing for hereditary cancer syndromes on 08/21/2016. Her results were negative for mutations in all 46 genes analyzed by [employer]'s 46-gene Common Hereditary Cancers Panel. Genes analyzed include: APC, ATM, AXIN2, BARD1, BMPR1A, BRCA1, BRCA2, BRIP1, CDH1, CDKN2A, CHEK2, CTNNA1, DICER1, EPCAM, GREM1, HOXB13, KIT, MEN1, MLH1, MSH2, MSH3, MSH6, MUTYH, NBN,   . Headache(784.0)    migraines  . History of blood transfusion 06/2005  . History of gallstones   . History of pneumonia   . History of shingles 07/2011  . History of shingles   . HTN (hypertension)   . Itching   . Joint pain   . Muscle weakness   . Neuropathy   . Normal coronary arteries 2003  . Osteoarthropathy   . Personal history of  chemotherapy 2018  . Personal history of radiation therapy 2018  . PFO (patent foramen ovale)   . Presence of inferior vena cava filter   . Pulmonary embolism ([place])   . Sleep apnea    used CPAP until after bypass surg.  . Status post gastric bypass for obesity   . Stomach pain   . Stroke [facility]) 12/2002   right-sided weakness  . Urinary incontinence     Past Surgical History:  Procedure Laterality Date  . ABDOMINAL HYSTERECTOMY  11/1997   complete  . ANAL RECTAL MANOMETRY N/A 01/21/2019   Procedure: ANO RECTAL MANOMETRY;  Surgeon: Arta Silence, MD;  Location: [facility];  Service: Endoscopy;  Laterality: N/A;  . ANTERIOR CERVICAL DECOMP/DISCECTOMY FUSION  02/05/2005   C5-6  . APPENDECTOMY  10/22/2008   laparoscopic  . BREAST LUMPECTOMY Left 07/2016  . BREAST LUMPECTOMY WITH RADIOACTIVE SEED AND SENTINEL LYMPH NODE BIOPSY Left 07/26/2016   Procedure: BREAST LUMPECTOMY WITH RADIOACTIVE SEED AND SENTINEL LYMPH NODE BIOPSY;  Surgeon: Erroll Luna, MD;  Location: [place];  Service: General;  Laterality: Left;  . BUNIONECTOMY  05/1980   both feet  . BUNIONECTOMY  08/2011   left foot  . CARDIAC CATHETERIZATION  03/04/2002  . CARPAL  TUNNEL RELEASE  06/21/2009   right  . CARPAL TUNNEL RELEASE     left hand  . CARPAL TUNNEL RELEASE  10/03/2011   Procedure: CARPAL TUNNEL RELEASE;  Surgeon: Wynonia Sours, MD;  Location: [facility];  Service: Orthopedics;  Laterality: Right;  CARPAL TUNNEL WITH HYPOTHENAR FAT PAD TRANSFER  . CERVICAL SPINE SURGERY  01/2005   titanium plate implanted  . CHOLECYSTECTOMY  1990  . ELBOW SURGERY  08/09/2004   decompression ulnar nerve right elbow  . ENTEROLYSIS  10/22/2008   laparoscopic abd. enterolysis  . GASTRIC ROUX-EN-Y  2009  . HEEL SPUR SURGERY  08/1997   left  . HEMORRHOID SURGERY  03/1993  . LAPAROSCOPIC LYSIS INTESTINAL ADHESIONS  02/14/2000  . NAILBED REPAIR  01/10/2005; 08/2011   exc. matrix bilat. great toe  . OTHER  SURGICAL HISTORY  12/1986   pt states that she had surgery to unclog her fallopean tubes  . PORTACATH PLACEMENT N/A 09/24/2016   Procedure: INSERTION PORT-A-CATH WITH [name];  Surgeon: Erroll Luna, MD;  Location: [facility];  Service: General;  Laterality: N/A;  . SHOULDER SURGERY     bilat. - (left:  06/2005)  . TONSILLECTOMY  07/1995  . TRIGGER FINGER RELEASE  04/25/2006   decompression A-1 pulley left thumb  . TRIGGER FINGER RELEASE Right 11/11/2018   Procedure: RELEASE TRIGGER FINGER/A-1 PULLEY RIGHT THUMB, RIGHT RING FINGER;  Surgeon: Daryll Brod, MD;  Location: [facility];  Service: Orthopedics;  Laterality: Right;  . UTERINE FIBROID SURGERY  12/95, 7/96   x2  . VENA CAVA FILTER PLACEMENT  2009   during Roux-en-Y surg.    There were no vitals filed for this visit.  Subjective Assessment - 07/02/19 1307    Subjective  I am doing a little better.    Currently in Pain?  Yes    Pain Score  3     Pain Location  Shoulder    Pain Orientation  Right    Pain Descriptors / Indicators  Sore    Aggravating Factors   activity         [facility] PT Assessment - 07/02/19 0001      AROM   Right Shoulder Flexion  133 Degrees    Right Shoulder Internal Rotation  57 Degrees    Right Shoulder External Rotation  75 Degrees                   [facility] Adult PT Treatment/Exercise - 07/02/19 0001      Shoulder Exercises: Standing   External Rotation  Both;20 reps;Theraband    Theraband Level (Shoulder External Rotation)  Level 2 (Red)    Internal Rotation  Left;20 reps;Right;Theraband    Theraband Level (Shoulder Internal Rotation)  Level 2 (Red)    Flexion  AAROM;Both;20 reps    Flexion Limitations  with wand sliding up the door frame    Extension  Both;20 reps;Theraband    Theraband Level (Shoulder Extension)  Level 2 (Red)    Extension Limitations  with wand and 3# as well    Row  Both;20 reps;Theraband    Theraband Level (Shoulder Row)  Level 2 (Red)    Other Standing  Exercises  biceps and triceps red tband     Other Standing Exercises  red tband biceps and triceps      Shoulder Exercises: ROM/Strengthening   UBE (Upper Arm Bike)  level 3 x 5 minutes    Wall Wash  flexion and circles  at face high cw/ccw      Shoulder Exercises: Stretch   Corner Stretch  3 reps;10 seconds    Corner Stretch Limitations  in Teacher, early years/pre  3 reps;10 seconds               PT Short Term Goals - 04/13/19 1734      PT SHORT TERM GOAL #1   Title  independent with initial HEP    Status  Achieved        PT Long Term Goals - 07/02/19 1346      PT LONG TERM GOAL #1   Title  Patient will demonstrate proper technique with her home exercise program to improve shoulder ROM.    Status  Partially Met      PT LONG TERM GOAL #2   Title  increase AROM of the right shoulder flexion to 150 degrees    Status  Partially Met      PT LONG TERM GOAL #3   Title  report no difficulty with dressing or doing hair    Status  Partially Met      PT LONG TERM GOAL #4   Title  decrease pain 50%    Status  Partially Met      PT LONG TERM GOAL #5   Title  increase strength of the right shoulder to 4/5    Status  Partially Met            Plan - 07/02/19 1343    Clinical Impression Statement  Patient seems to have less pain.  She is getting increased ROM slowly, she has some pain in the rhomboid.  I gave yellow, red and green tband.  She feels that she understand how to do these safely and was able to demo this.    PT Next Visit Plan  continue to progress as tolerated    Consulted and Agree with Plan of Care  Patient       Patient will benefit from skilled therapeutic intervention in order to improve the following deficits and impairments:  Improper body mechanics, Pain, Increased muscle spasms, Postural dysfunction, Impaired UE functional use, Decreased strength, Decreased range of motion, Increased edema  Visit Diagnosis: Acute pain of right  shoulder  Stiffness of right shoulder, not elsewhere classified     Problem List Patient Active Problem List   Diagnosis Date Noted  . Class 2 severe obesity with serious comorbidity and body mass index (BMI) of 37.0 to 37.9 in adult [facility]) 01/15/2019  . Status post gastric surgery 07/29/2018  . Prediabetes 03/06/2018  . Vitamin D deficiency 02/18/2018  . Chemotherapy-induced peripheral neuropathy ([place]) 02/15/2017  . Port catheter in place 11/02/2016  . Genetic testing 09/19/2016  . Pre-operative clearance 07/23/2016  . Malignant neoplasm of lower-outer quadrant of left breast of female, estrogen receptor positive ([place]) 07/10/2016  . Essential hypertension   . Chest pain 09/30/2015  . DVT, lower extremity ([place]) 06/18/2011  . DVT of upper extremity (deep vein thrombosis) ([place]) 06/18/2011  . [place] filter in place 06/18/2011  . History of stroke 06/18/2011  . PFO (patent foramen ovale) 06/18/2011  . Status post gastric bypass for obesity 06/18/2011  . PELVIC PAIN, CHRONIC 07/20/2008  . FIBROIDS, UTERUS 07/16/2008  . ASTHMA 07/16/2008  . FIBROMYALGIA 07/16/2008  . CARPAL TUNNEL SYNDROME, HX OF 07/16/2008  . History of pulmonary embolus ([place]) 07/16/2008    Sumner Boast., PT 07/02/2019, 1:48 PM  [facility]  [place], [name], [address] Phone: [phone]   Fax:  [phone]  Name: [name] MRN: [id number] Date of Birth: [birth year]

## 2019-07-03 MED FILL — SOLIFENACIN SUCCINATE 10 MG: 10 | 30 days supply | Qty: 30 | Fill #1

## 2019-07-06 ENCOUNTER — Ambulatory Visit: Payer: Medicare Other | Admitting: Hematology and Oncology

## 2019-07-07 ENCOUNTER — Ambulatory Visit: Payer: Medicare Other | Admitting: Physical Therapy

## 2019-07-09 ENCOUNTER — Ambulatory Visit: Payer: Medicare Other | Admitting: Physical Therapy

## 2019-07-15 NOTE — Progress Notes (Addendum)
Patient Care Team: [name], MD as PCP - General (Family Medicine) Croitoru, Dani Gobble, MD as PCP - Cardiology (Cardiology) Nicholas Lose, MD as Consulting Physician (Hematology and Oncology) Kyung Rudd, MD as Consulting Physician (Radiation Oncology) Erroll Luna, MD as Consulting Physician (General Surgery) Gardenia Phlegm, NP as Nurse Practitioner (Hematology and Oncology)  DIAGNOSIS:    ICD-10-CM   1. Malignant neoplasm of lower-outer quadrant of left breast of female, estrogen receptor positive ([place])  C50.512    Z17.0     SUMMARY OF ONCOLOGIC HISTORY: Oncology History  Malignant neoplasm of lower-outer quadrant of left breast of female, estrogen receptor positive ([place])  06/26/2016 Initial Diagnosis   Left breast biopsy 3:00: IDC with DCIS, grade 3, ER 80%, PR 20%, Ki-67 60%, HER-2 negative ratio 1.18; palpable lump: 1.8 cm lesion, no lymph nodes, T1 cN0 stage I a clinical stage   07/26/2016 Surgery   Left lumpectomy: IDC 1.8 cm, with DCIS, margins negative, 0/2 lymph nodes, ER 80%, PR 20%, HER-2 negative ratio 1.18, Ki-67 60%, T1cN0 stage IA    08/21/2016 Oncotype testing   Oncotype DX recurrence score 51, risk of recurrence 34%   08/21/2016 Genetic Testing   Genetic counseling and testing for hereditary cancer syndromes performed on 08/21/2016. Results are negative for pathogenic mutations in 46 genes analyzed by [employer]'s Common Hereditary Cancers Panel. Results are dated 09/14/2016. Genes tested: APC, ATM, AXIN2, BARD1, BMPR1A, BRCA1, BRCA2, BRIP1, CDH1, CDKN2A, CHEK2, CTNNA1, DICER1, EPCAM, GREM1, HOXB13, KIT, MEN1, MLH1, MSH2, MSH3, MSH6, MUTYH, NBN, NF1, NTHL1, PALB2, PDGFRA, PMS2, POLD1, POLE, PTEN, RAD50, RAD51C, RAD51D, SDHA, SDHB, SDHC, SDHD, SMAD4, SMARCA4, STK11, TP53, TSC1, TSC2, and VHL.     11/02/2016 - 04/19/2017 Chemotherapy   Dose dense Adriamycin and Cytoxan 4 followed by Taxol weekly 12   06/10/2017 - 07/26/2017 Radiation Therapy   Adjuvant  radiation with Dr. Lisbeth Renshaw   08/2017 -  Anti-estrogen oral therapy   Letrozole daily     CHIEF COMPLIANT: Follow-up of left breast cancer on letrozole  INTERVAL HISTORY: [name] is a 59 y.o. with above-mentioned history of left breast cancer treated with lumpectomy, adjuvant chemotherapy, radiation, and who is currently on antiestrogen therapy with letrozole. Mammogram on 03/10/19 showed no evidence of malignancy bilaterally. She presents to the clinic today for follow-up.  She complains of muscle stiffness that lasts all day and does not get better even with activity.  ALLERGIES:  is allergic to aspirin; oxycodone hcl; propoxyphene n-acetaminophen; tramadol; adhesive [tape]; and prednisone.  MEDICATIONS:  Current Outpatient Medications  Medication Sig Dispense Refill  . albuterol (PROAIR HFA) 108 (90 Base) MCG/ACT inhaler Inhale 2 puffs into the lungs every 6 (six) hours as needed for wheezing or shortness of breath.    Marland Kitchen aspirin EC 81 MG tablet Take 81 mg by mouth daily.    . Calcium Citrate-Vitamin D (CALCIUM CITRATE +D PO) Take 2 tablets by mouth daily. Calcium 600 mg     . Cholecalciferol (VITAMIN D3) 1.25 MG (50000 UT) CAPS Take 1 capsule by mouth every 14 (fourteen) days. 4 capsule 0  . GRAPE SEED CR PO Take by mouth.    . hydrOXYzine (ATARAX/VISTARIL) 25 MG tablet Take 25 mg by mouth 3 (three) times daily as needed.    Marland Kitchen letrozole (FEMARA) 2.5 MG tablet TAKE 1 TABLET (2.5 MG TOTAL) BY MOUTH DAILY. 90 tablet 1  . Methocarbamol (ROBAXIN-750 PO) Robaxin    . mometasone (ELOCON) 0.1 % cream Apply 1 application topically daily as needed (  rash - summer eczema). Apply to arms    . Multiple Vitamin (MULTIVITAMIN WITH MINERALS) TABS tablet Take 1 tablet by mouth at bedtime.    . silver sulfADIAZINE (SILVADENE) 1 % cream Apply 1 application topically 2 (two) times daily as needed (stomach tears). 50 g 0  . solifenacin (VESICARE) 10 MG tablet     . valACYclovir (VALTREX) 1000 MG  tablet Take 1 tablet (1,000 mg total) by mouth 3 (three) times daily. 21 tablet 0   No current facility-administered medications for this visit.    PHYSICAL EXAMINATION: ECOG PERFORMANCE STATUS: 1 - Symptomatic but completely ambulatory  Vitals:   07/16/19 0936  BP: 124/69  Pulse: 62  Resp: 18  Temp: 98 F (36.7 C)  SpO2: 100%   Filed Weights   07/16/19 0936  Weight: 207 lb 8 oz (94.1 kg)    BREAST: No palpable masses or nodules in either right or left breasts. No palpable axillary supraclavicular or infraclavicular adenopathy no breast tenderness or nipple discharge. (exam performed in the presence of a chaperone)  LABORATORY DATA:  I have reviewed the data as listed CMP Latest Ref Rng & Units 06/09/2019 12/17/2018 06/10/2018  Glucose 65 - 99 mg/dL 83 65 87  BUN 6 - 24 mg/dL 16 14 11   Creatinine 0.57 - 1.00 mg/dL 0.84 0.68 0.80  Sodium 134 - 144 mmol/L 142 140 138  Potassium 3.5 - 5.2 mmol/L 5.0 4.0 4.9  Chloride 96 - 106 mmol/L 102 102 100  CO2 20 - 29 mmol/L 25 20 22   Calcium 8.7 - 10.2 mg/dL 10.0 9.1 9.3  Total Protein 6.0 - 8.5 g/dL 7.6 7.5 6.7  Total Bilirubin 0.0 - 1.2 mg/dL 0.4 0.5 0.5  Alkaline Phos 39 - 117 IU/L 99 102 82  AST 0 - 40 IU/L 17 16 17   ALT 0 - 32 IU/L 8 14 15     Lab Results  Component Value Date   WBC 4.5 06/09/2019   HGB 12.2 06/09/2019   HCT 36.8 06/09/2019   MCV 95 06/09/2019   PLT 234 06/09/2019   NEUTROABS 2.4 06/09/2019    ASSESSMENT & PLAN:  Malignant neoplasm of lower-outer quadrant of left breast of female, estrogen receptor positive ([place]) 07/26/2016: Left lumpectomy: IDC 1.8 cm, with DCIS, margins negative, 0/2 lymph nodes, ER 80%, PR 20%, HER-2 negative ratio 1.18, Ki-67 60%, T1cN0 stage IA  Oncotype DX score 51: 10 year risk of recurrence greater than 34%, extremely high risk  Treatment Summary: 1. Adjuvant chemotherapy with Adriamycin/Cytoxan x 4, then weekly Taxol x 12 completed 04/19/17 2. Adjuvant radiation  therapycompleted on 07/26/2017 3. Adjuvant antiestrogen therapy starts 08/28/17 -------------------------------------------------------------------------------------------------------------------------------- Plan: Adj Anti estrogen therapy with Letrozole 2.5 mg daily Letrozole toxicities: Denies any hot flashes.  She does have severe muscle stiffness which lasts all day and does not get better with activity  Chemo-induced peripheral neuropathy: She stopped gabapentin but continues to have neuropathy in her feet and now in her hands as well.  Breast cancer surveillance: 1.  Breast exam 07/16/2019: Benign 2.  Mammogram: 03/10/2019: Benign breast density category D (MRI recommended because of high density and family history) We will obtain a breast MRI in the next month.  I will call her with these results.  Return to clinic in 1 year for follow-up   No orders of the defined types were placed in this encounter.  The patient has a good understanding of the overall plan. she agrees with it. she will call with any problems  that may develop before the next visit here.  Total time spent: 20 mins including face to face time and time spent for planning, charting and coordination of care  Nicholas Lose, MD 07/16/2019  I, Cloyde Reams Dorshimer, am acting as scribe for Dr. Nicholas Lose.  I have reviewed the above documentation for accuracy and completeness, and I agree with the above.

## 2019-07-16 ENCOUNTER — Ambulatory Visit: Payer: Medicare Other | Admitting: Hematology and Oncology

## 2019-07-16 ENCOUNTER — Inpatient Hospital Stay: Payer: Medicare Other | Attending: Hematology and Oncology | Admitting: Hematology and Oncology

## 2019-07-16 ENCOUNTER — Other Ambulatory Visit: Payer: Self-pay

## 2019-07-16 DIAGNOSIS — Z923 Personal history of irradiation: Secondary | ICD-10-CM | POA: Diagnosis not present

## 2019-07-16 DIAGNOSIS — G620 Drug-induced polyneuropathy: Secondary | ICD-10-CM | POA: Insufficient documentation

## 2019-07-16 DIAGNOSIS — Z79811 Long term (current) use of aromatase inhibitors: Secondary | ICD-10-CM | POA: Insufficient documentation

## 2019-07-16 DIAGNOSIS — Z9221 Personal history of antineoplastic chemotherapy: Secondary | ICD-10-CM | POA: Insufficient documentation

## 2019-07-16 DIAGNOSIS — T451X5A Adverse effect of antineoplastic and immunosuppressive drugs, initial encounter: Secondary | ICD-10-CM | POA: Insufficient documentation

## 2019-07-16 DIAGNOSIS — Z170 Estrogen receptor positive status [ER+]: Secondary | ICD-10-CM | POA: Insufficient documentation

## 2019-07-16 DIAGNOSIS — C50512 Malignant neoplasm of lower-outer quadrant of left female breast: Secondary | ICD-10-CM | POA: Insufficient documentation

## 2019-07-16 MED ORDER — LETROZOLE 2.5 MG PO TABS: 2.5000 mg | ORAL_TABLET | Freq: Every day | ORAL | 3 refills | Status: DC

## 2019-07-16 MED ORDER — DOCUSATE SODIUM 100 MG PO CAPS: 100.0000 mg | ORAL_CAPSULE | Freq: Two times a day (BID) | ORAL | 3 refills | Status: DC

## 2019-07-16 MED FILL — DOK 100 MG SOFTGEL: 100 | 50 days supply | Qty: 100 | Fill #0

## 2019-07-16 NOTE — Assessment & Plan Note (Signed)
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 Adjuvant chemotherapy with Adriamycin/Cytoxan x 4, then weekly Taxol x 12 completed 04/19/17 2. Adjuvant radiation therapycompleted on 07/26/2017 3. Adjuvant antiestrogen therapy starts 08/28/17 -------------------------------------------------------------------------------------------------------------------------------- Plan: Adj Anti estrogen therapy with Letrozole 2.5 mg daily Letrozole toxicities:  Chemo-induced peripheral neuropathy: On gabapentin  Breast cancer surveillance: 1.  Breast exam 07/16/2019: Benign 2.  Mammogram: 03/10/2019: Benign breast density category D (MRI recommended because of high density and family history) Return to clinic in 1 year for follow-up

## 2019-07-17 ENCOUNTER — Telehealth: Payer: Self-pay | Admitting: Hematology and Oncology

## 2019-07-17 NOTE — Telephone Encounter (Signed)
I left a message regarding schedule  

## 2019-07-18 ENCOUNTER — Ambulatory Visit
Admission: RE | Admit: 2019-07-18 | Discharge: 2019-07-18 | Disposition: A | Discharge status: Home / Self Care | Payer: Medicare Other | Source: Ambulatory Visit | Attending: Orthopaedic Surgery | Admitting: Orthopaedic Surgery

## 2019-07-18 DIAGNOSIS — M47816 Spondylosis without myelopathy or radiculopathy, lumbar region: Secondary | ICD-10-CM

## 2019-07-18 DIAGNOSIS — M5126 Other intervertebral disc displacement, lumbar region: Secondary | ICD-10-CM | POA: Diagnosis not present

## 2019-07-20 ENCOUNTER — Ambulatory Visit: Payer: Medicare Other | Admitting: Physical Therapy

## 2019-07-21 ENCOUNTER — Encounter: Payer: Self-pay | Admitting: Hematology and Oncology

## 2019-07-22 ENCOUNTER — Other Ambulatory Visit: Payer: Self-pay

## 2019-07-22 ENCOUNTER — Ambulatory Visit: Payer: Medicare Other | Attending: Orthopedic Surgery | Admitting: Physical Therapy

## 2019-07-22 ENCOUNTER — Encounter: Payer: Self-pay | Admitting: Physical Therapy

## 2019-07-22 DIAGNOSIS — M25611 Stiffness of right shoulder, not elsewhere classified: Secondary | ICD-10-CM

## 2019-07-22 DIAGNOSIS — M25511 Pain in right shoulder: Secondary | ICD-10-CM | POA: Diagnosis not present

## 2019-07-22 NOTE — Therapy (Signed)
[facility] [place], [name], [address] Phone: [phone]   Fax:  [phone]  Physical Therapy Treatment  Patient Details  Name: [name] MRN: [id number] Date of Birth: 30-Dec-1960 Referring Provider (PT): [name] Date: 07/22/2019  PT End of Session - 07/22/19 1427    Visit Number  23    Date for PT Re-Evaluation  08/22/19    PT Start Time  1400    PT Stop Time  1433    PT Time Calculation (min)  33 min    Activity Tolerance  Patient tolerated treatment well    Behavior During Therapy  [facility] for tasks assessed/performed       Past Medical History:  Diagnosis Date  . Anemia    since bypass  . Arthritis    osteoarthritis  . Asthma    states no asthma attack since 2002  . Back pain   . Breast cancer ([place]) 06/26/16 bx   left breast  . Breast cancer ([place])   . Chronic back pain   . Complication of anesthesia    states takes more than normal to put her to sleep  . Constipation   . Dental bridge present    upper  . Dental crowns present   . DVT of upper extremity (deep vein thrombosis) ([place])   . Fibromyalgia   . Genetic testing 09/19/2016   Ms. Iseman underwent genetic counseling and testing for hereditary cancer syndromes on 08/21/2016. Her results were negative for mutations in all 46 genes analyzed by [employer]'s 46-gene Common Hereditary Cancers Panel. Genes analyzed include: APC, ATM, AXIN2, BARD1, BMPR1A, BRCA1, BRCA2, BRIP1, CDH1, CDKN2A, CHEK2, CTNNA1, DICER1, EPCAM, GREM1, HOXB13, KIT, MEN1, MLH1, MSH2, MSH3, MSH6, MUTYH, NBN,   . Headache(784.0)    migraines  . History of blood transfusion 06/2005  . History of gallstones   . History of pneumonia   . History of shingles 07/2011  . History of shingles   . HTN (hypertension)   . Itching   . Joint pain   . Muscle weakness   . Neuropathy   . Normal coronary arteries 2003  . Osteoarthropathy   . Personal history of  chemotherapy 2018  . Personal history of radiation therapy 2018  . PFO (patent foramen ovale)   . Presence of inferior vena cava filter   . Pulmonary embolism ([place])   . Sleep apnea    used CPAP until after bypass surg.  . Status post gastric bypass for obesity   . Stomach pain   . Stroke [facility]) 12/2002   right-sided weakness  . Urinary incontinence     Past Surgical History:  Procedure Laterality Date  . ABDOMINAL HYSTERECTOMY  11/1997   complete  . ANAL RECTAL MANOMETRY N/A 01/21/2019   Procedure: ANO RECTAL MANOMETRY;  Surgeon: Arta Silence, MD;  Location: [facility];  Service: Endoscopy;  Laterality: N/A;  . ANTERIOR CERVICAL DECOMP/DISCECTOMY FUSION  02/05/2005   C5-6  . APPENDECTOMY  10/22/2008   laparoscopic  . BREAST LUMPECTOMY Left 07/2016  . BREAST LUMPECTOMY WITH RADIOACTIVE SEED AND SENTINEL LYMPH NODE BIOPSY Left 07/26/2016   Procedure: BREAST LUMPECTOMY WITH RADIOACTIVE SEED AND SENTINEL LYMPH NODE BIOPSY;  Surgeon: Erroll Luna, MD;  Location: [facility];  Service: General;  Laterality: Left;  . BUNIONECTOMY  05/1980   both feet  . BUNIONECTOMY  08/2011   left foot  . CARDIAC CATHETERIZATION  03/04/2002  . CARPAL  TUNNEL RELEASE  06/21/2009   right  . CARPAL TUNNEL RELEASE     left hand  . CARPAL TUNNEL RELEASE  10/03/2011   Procedure: CARPAL TUNNEL RELEASE;  Surgeon: Wynonia Sours, MD;  Location: [facility];  Service: Orthopedics;  Laterality: Right;  CARPAL TUNNEL WITH HYPOTHENAR FAT PAD TRANSFER  . CERVICAL SPINE SURGERY  01/2005   titanium plate implanted  . CHOLECYSTECTOMY  1990  . ELBOW SURGERY  08/09/2004   decompression ulnar nerve right elbow  . ENTEROLYSIS  10/22/2008   laparoscopic abd. enterolysis  . GASTRIC ROUX-EN-Y  2009  . HEEL SPUR SURGERY  08/1997   left  . HEMORRHOID SURGERY  03/1993  . LAPAROSCOPIC LYSIS INTESTINAL ADHESIONS  02/14/2000  . NAILBED REPAIR  01/10/2005; 08/2011   exc. matrix bilat. great toe  . OTHER  SURGICAL HISTORY  12/1986   pt states that she had surgery to unclog her fallopean tubes  . PORTACATH PLACEMENT N/A 09/24/2016   Procedure: INSERTION PORT-A-CATH WITH [name];  Surgeon: Erroll Luna, MD;  Location: [place];  Service: General;  Laterality: N/A;  . SHOULDER SURGERY     bilat. - (left:  06/2005)  . TONSILLECTOMY  07/1995  . TRIGGER FINGER RELEASE  04/25/2006   decompression A-1 pulley left thumb  . TRIGGER FINGER RELEASE Right 11/11/2018   Procedure: RELEASE TRIGGER FINGER/A-1 PULLEY RIGHT THUMB, RIGHT RING FINGER;  Surgeon: Daryll Brod, MD;  Location: [facility];  Service: Orthopedics;  Laterality: Right;  . UTERINE FIBROID SURGERY  12/95, 7/96   x2  . VENA CAVA FILTER PLACEMENT  2009   during Roux-en-Y surg.    There were no vitals filed for this visit.  Subjective Assessment - 07/22/19 1406    Subjective  Patient reports that she has not been in in a little over 3 weeks, reports that she is doing a little better, still pain with reaching especially behind back    Pain Score  1     Pain Location  Shoulder    Pain Orientation  Right;Anterior    Pain Descriptors / Indicators  Aching    Aggravating Factors   reaching behind back         [facility] PT Assessment - 07/22/19 0001      AROM   Right Shoulder Flexion  141 Degrees    Right Shoulder ABduction  115 Degrees    Right Shoulder Internal Rotation  64 Degrees   can walk the hand up to the bra strap   Right Shoulder External Rotation  80 Degrees      PROM   Right Shoulder Flexion  164 Degrees    Right Shoulder ABduction  120 Degrees    Right Shoulder Internal Rotation  80 Degrees    Right Shoulder External Rotation  90 Degrees                   [facility] Adult PT Treatment/Exercise - 07/22/19 0001      Shoulder Exercises: Standing   Other Standing Exercises  practiced cabinet reaching shoulder height and overhead high 1#, 2# and 3#, some compensation with 3# reaching overhead with shoulder  elevation      Shoulder Exercises: ROM/Strengthening   UBE (Upper Arm Bike)  level 3 x 5 minutes             PT Education - 07/22/19 1426    Education Details  went over cabinet reaching with cans of food, shoulder height and overhead  height, warned about compensation    Person(s) Educated  Patient    Methods  Explanation    Comprehension  Verbalized understanding;Returned demonstration;Verbal cues required       PT Short Term Goals - 04/13/19 1734      PT SHORT TERM GOAL #1   Title  independent with initial HEP    Status  Achieved        PT Long Term Goals - 07/22/19 1433      PT LONG TERM GOAL #1   Title  Patient will demonstrate proper technique with her home exercise program to improve shoulder ROM.    Status  Achieved      PT LONG TERM GOAL #2   Title  increase AROM of the right shoulder flexion to 150 degrees    Status  Partially Met      PT LONG TERM GOAL #3   Title  report no difficulty with dressing or doing hair    Status  Partially Met      PT LONG TERM GOAL #4   Title  decrease pain 50%    Status  Achieved      PT LONG TERM GOAL #5   Title  increase strength of the right shoulder to 4/5    Status  Achieved            Plan - 07/22/19 1427    Clinical Impression Statement  I have not seen patient in a little over 3 weeks, she is doing well, and has improved ROM without any increase of pain.  She is having some pain iwth reaching out and with reaching behind her.  The pain is in the biceps area.  With reaching into cabinet she does have some compensation with shoulder elevation.    PT Next Visit Plan  hold until she sees the MD, I feel like she is doing well without PT and can progress on own with what we talked about today    Consulted and Agree with Plan of Care  Patient       Patient will benefit from skilled therapeutic intervention in order to improve the following deficits and impairments:  Improper body mechanics, Pain, Increased  muscle spasms, Postural dysfunction, Impaired UE functional use, Decreased strength, Decreased range of motion, Increased edema  Visit Diagnosis: Acute pain of right shoulder  Stiffness of right shoulder, not elsewhere classified     Problem List Patient Active Problem List   Diagnosis Date Noted  . Class 2 severe obesity with serious comorbidity and body mass index (BMI) of 37.0 to 37.9 in adult [facility]) 01/15/2019  . Status post gastric surgery 07/29/2018  . Prediabetes 03/06/2018  . Vitamin D deficiency 02/18/2018  . Chemotherapy-induced peripheral neuropathy ([place]) 02/15/2017  . Port catheter in place 11/02/2016  . Genetic testing 09/19/2016  . Pre-operative clearance 07/23/2016  . Malignant neoplasm of lower-outer quadrant of left breast of female, estrogen receptor positive ([place]) 07/10/2016  . Essential hypertension   . Chest pain 09/30/2015  . DVT, lower extremity ([place]) 06/18/2011  . DVT of upper extremity (deep vein thrombosis) ([place]) 06/18/2011  . [place] filter in place 06/18/2011  . History of stroke 06/18/2011  . PFO (patent foramen ovale) 06/18/2011  . Status post gastric bypass for obesity 06/18/2011  . PELVIC PAIN, CHRONIC 07/20/2008  . FIBROIDS, UTERUS 07/16/2008  . ASTHMA 07/16/2008  . FIBROMYALGIA 07/16/2008  . CARPAL TUNNEL SYNDROME, HX OF 07/16/2008  . History of pulmonary embolus ([place]) 07/16/2008  Sumner Boast., PT 07/22/2019, 2:34 PM  [place], [name], [address] Phone: [phone]   Fax:  [phone]  Name: [name] MRN: [id number] Date of Birth: 1960/10/05

## 2019-07-24 DIAGNOSIS — M545 Low back pain: Secondary | ICD-10-CM | POA: Diagnosis not present

## 2019-07-24 DIAGNOSIS — M47816 Spondylosis without myelopathy or radiculopathy, lumbar region: Secondary | ICD-10-CM | POA: Diagnosis not present

## 2019-07-24 DIAGNOSIS — M5136 Other intervertebral disc degeneration, lumbar region: Secondary | ICD-10-CM | POA: Diagnosis not present

## 2019-07-27 ENCOUNTER — Encounter: Payer: Medicare Other | Admitting: Physical Therapy

## 2019-07-29 ENCOUNTER — Encounter: Payer: Medicare Other | Admitting: Physical Therapy

## 2019-08-03 DIAGNOSIS — M5136 Other intervertebral disc degeneration, lumbar region: Secondary | ICD-10-CM | POA: Diagnosis not present

## 2019-08-04 ENCOUNTER — Encounter (INDEPENDENT_AMBULATORY_CARE_PROVIDER_SITE_OTHER): Payer: Self-pay | Admitting: Bariatrics

## 2019-08-04 ENCOUNTER — Other Ambulatory Visit: Payer: Self-pay

## 2019-08-04 ENCOUNTER — Ambulatory Visit (INDEPENDENT_AMBULATORY_CARE_PROVIDER_SITE_OTHER): Payer: Medicare Other | Admitting: Bariatrics

## 2019-08-04 VITALS — BP 115/73 | HR 63 | Temp 98.0°F | Ht 61.0 in | Wt 198.0 lb

## 2019-08-04 DIAGNOSIS — E559 Vitamin D deficiency, unspecified: Secondary | ICD-10-CM | POA: Diagnosis not present

## 2019-08-04 DIAGNOSIS — Z9889 Other specified postprocedural states: Secondary | ICD-10-CM | POA: Diagnosis not present

## 2019-08-04 DIAGNOSIS — Z6837 Body mass index (BMI) 37.0-37.9, adult: Secondary | ICD-10-CM | POA: Diagnosis not present

## 2019-08-04 MED ORDER — VITAMIN D3 1.25 MG (50000 UT) PO CAPS: 1.0000 | ORAL_CAPSULE | ORAL | 0 refills | Status: DC

## 2019-08-04 MED FILL — VIT D3-50 50,000 UNITS CAPS: 1.25 MG | 56 days supply | Qty: 4 | Fill #0

## 2019-08-04 NOTE — Progress Notes (Signed)
Chief Complaint:   OBESITY [name] is here to discuss her progress with her obesity treatment plan along with follow-up of her obesity related diagnoses. [name] is on the Category 2 Plan and journaling 1200 calories and 80-90 grams of protein and states she is following her eating plan approximately 100% of the time. [name] states she is walking 15 minutes 3 times per week.  Today's visit was #: 53 Starting weight: 204 lbs Starting date: 02/18/2018 Today's weight: 198 lbs Today's date: 08/04/2019 Total lbs lost to date: 6 Total lbs lost since last in-office visit: 3  Interim History: [name] is down 3 lbs. Her problems with her shoulder have essentially resolved and she has more mobility. She is still having some issues with her back. She reports doing well with her water intake.  Subjective:   Vitamin D deficiency. Last Vitamin D 40.6 on 06/09/2019.  Status post gastric surgery. [name] is taking multivitamin and Vitamin D.  Assessment/Plan:   Vitamin D deficiency. Low Vitamin D level contributes to fatigue and are associated with obesity, breast, and colon cancer. She was given a prescription for Cholecalciferol (VITAMIN D3) 1.25 MG (50000 UT) CAPS every 14 days #2 with 0 refills and will follow-up for routine testing of Vitamin D, at least 2-3 times per year to avoid over-replacement.   Status post gastric surgery. [name] will continue her vitamins as directed.  Class 2 severe obesity due to excess calories with serious comorbidity and body mass index (BMI) of 37.0 to 37.9 in adult [facility]).  [name] is currently in the action stage of change. As such, her goal is to continue with weight loss efforts. She has agreed to the Category 2 Plan and journal 1200 calories and 80-90 grams of protein.   She will work on meal planning.  Exercise goals: [name] is walking for 15 minutes and will build up to walking 20 minutes 3 days a week.  Behavioral  modification strategies: increasing lean protein intake, decreasing simple carbohydrates, increasing vegetables, increasing water intake, decreasing eating out, no skipping meals, meal planning and cooking strategies, keeping healthy foods in the home and planning for success.  [name] has agreed to follow-up with our clinic in 4 weeks. She was informed of the importance of frequent follow-up visits to maximize her success with intensive lifestyle modifications for her multiple health conditions.   Objective:   Blood pressure 115/73, pulse 63, temperature 98 F (36.7 C), height 5\' 1"  (1.549 m), weight 198 lb (89.8 kg), SpO2 98 %. Body mass index is 37.41 kg/m.  General: Cooperative, alert, well developed, in no acute distress. HEENT: Conjunctivae and lids unremarkable. Cardiovascular: Regular rhythm.  Lungs: Normal work of breathing. Neurologic: No focal deficits.   Lab Results  Component Value Date   CREATININE 0.84 06/09/2019   BUN 16 06/09/2019   NA 142 06/09/2019   K 5.0 06/09/2019   CL 102 06/09/2019   CO2 25 06/09/2019   Lab Results  Component Value Date   ALT 8 06/09/2019   AST 17 06/09/2019   ALKPHOS 99 06/09/2019   BILITOT 0.4 06/09/2019   Lab Results  Component Value Date   HGBA1C 5.5 06/09/2019   HGBA1C 5.6 12/17/2018   HGBA1C 5.7 (H) 06/10/2018   HGBA1C 5.8 (H) 02/18/2018   Lab Results  Component Value Date   INSULIN 7.0 06/09/2019   INSULIN 4.9 02/18/2018   Lab Results  Component Value Date   TSH 0.802 02/18/2018   Lab Results  Component Value Date   CHOL 210 (H) 11/07/2018   HDL 84 11/07/2018   LDLCALC 114 (H) 11/07/2018   TRIG 61 11/07/2018   CHOLHDL 2.5 11/07/2018   Lab Results  Component Value Date   WBC 4.5 06/09/2019   HGB 12.2 06/09/2019   HCT 36.8 06/09/2019   MCV 95 06/09/2019   PLT 234 06/09/2019   No results found for: IRON, TIBC, FERRITIN  Obesity Behavioral Intervention Documentation for Insurance:   Approximately 15  minutes were spent on the discussion below.  ASK: We discussed the diagnosis of obesity with [name] today and [name] agreed to give [name] permission to discuss obesity behavioral modification therapy today.  ASSESS: [name] has the diagnosis of obesity and her BMI today is 37.6. [name] is in the action stage of change.   ADVISE: [name] was educated on the multiple health risks of obesity as well as the benefit of weight loss to improve her health. She was advised of the need for long term treatment and the importance of lifestyle modifications to improve her current health and to decrease her risk of future health problems.  AGREE: Multiple dietary modification options and treatment options were discussed and [name] agreed to follow the recommendations documented in the above note.  ARRANGE: [name] was educated on the importance of frequent visits to treat obesity as outlined per CMS and USPSTF guidelines and agreed to schedule her next follow up appointment today.  Attestation Statements:   Reviewed by clinician on day of visit: allergies, medications, problem list, medical history, surgical history, family history, social history, and previous encounter notes.  Migdalia Dk, am acting as Location manager for [employer], DO   I have reviewed the above documentation for accuracy and completeness, and I agree with the above. Jearld Lesch, DO

## 2019-08-06 DIAGNOSIS — Z4789 Encounter for other orthopedic aftercare: Secondary | ICD-10-CM | POA: Diagnosis not present

## 2019-08-06 DIAGNOSIS — M25511 Pain in right shoulder: Secondary | ICD-10-CM | POA: Diagnosis not present

## 2019-08-06 MED FILL — SOLIFENACIN SUCCINATE 10 MG: 10 | 30 days supply | Qty: 30 | Fill #2

## 2019-08-06 MED FILL — hydrOXYzine HCL 10 MG TABS: 10 | 30 days supply | Qty: 30 | Fill #1

## 2019-08-06 MED FILL — LISINOPRIL 10 MG TABS: 10 | 90 days supply | Qty: 90 | Fill #0

## 2019-08-07 MED FILL — HYDROCODON-APAP 5-325: 5-325 | 3 days supply | Qty: 14 | Fill #0

## 2019-08-07 MED FILL — PENICILLIN VK 500 MG TABLET: 500 | 7 days supply | Qty: 28 | Fill #0

## 2019-08-11 ENCOUNTER — Other Ambulatory Visit: Payer: Self-pay

## 2019-08-11 ENCOUNTER — Ambulatory Visit
Admission: RE | Admit: 2019-08-11 | Discharge: 2019-08-11 | Disposition: A | Discharge status: Home / Self Care | Payer: Medicare Other | Source: Ambulatory Visit | Attending: Hematology and Oncology | Admitting: Hematology and Oncology

## 2019-08-11 DIAGNOSIS — Z170 Estrogen receptor positive status [ER+]: Secondary | ICD-10-CM

## 2019-08-11 DIAGNOSIS — Z853 Personal history of malignant neoplasm of breast: Secondary | ICD-10-CM | POA: Diagnosis not present

## 2019-08-11 DIAGNOSIS — C50512 Malignant neoplasm of lower-outer quadrant of left female breast: Secondary | ICD-10-CM

## 2019-08-11 MED ORDER — GADOBUTROL 1 MMOL/ML IV SOLN
10.0000 mL | Freq: Once | INTRAVENOUS | Status: AC | PRN
  Administered 2019-08-11: 10 mL via INTRAVENOUS

## 2019-08-27 ENCOUNTER — Encounter (INDEPENDENT_AMBULATORY_CARE_PROVIDER_SITE_OTHER): Payer: Self-pay

## 2019-08-27 DIAGNOSIS — M545 Low back pain: Secondary | ICD-10-CM | POA: Diagnosis not present

## 2019-08-27 DIAGNOSIS — M47816 Spondylosis without myelopathy or radiculopathy, lumbar region: Secondary | ICD-10-CM | POA: Diagnosis not present

## 2019-08-27 DIAGNOSIS — M5136 Other intervertebral disc degeneration, lumbar region: Secondary | ICD-10-CM | POA: Diagnosis not present

## 2019-09-01 ENCOUNTER — Ambulatory Visit (INDEPENDENT_AMBULATORY_CARE_PROVIDER_SITE_OTHER): Payer: Medicare Other | Admitting: Bariatrics

## 2019-09-03 DIAGNOSIS — I10 Essential (primary) hypertension: Secondary | ICD-10-CM | POA: Diagnosis not present

## 2019-09-03 DIAGNOSIS — I6789 Other cerebrovascular disease: Secondary | ICD-10-CM | POA: Diagnosis not present

## 2019-09-03 DIAGNOSIS — M797 Fibromyalgia: Secondary | ICD-10-CM | POA: Diagnosis not present

## 2019-09-03 DIAGNOSIS — K912 Postsurgical malabsorption, not elsewhere classified: Secondary | ICD-10-CM | POA: Diagnosis not present

## 2019-09-03 MED FILL — CHLORHEXIDINE 0.12% RINSE: 0.12 | 32 days supply | Qty: 473 | Fill #0

## 2019-09-21 ENCOUNTER — Other Ambulatory Visit (HOSPITAL_BASED_OUTPATIENT_CLINIC_OR_DEPARTMENT_OTHER): Payer: Self-pay | Admitting: Family Medicine

## 2019-09-22 ENCOUNTER — Other Ambulatory Visit (HOSPITAL_BASED_OUTPATIENT_CLINIC_OR_DEPARTMENT_OTHER): Payer: Self-pay | Admitting: Family Medicine

## 2019-09-22 MED FILL — METHOCARBAMOL 750 MG TABS: 750 | 90 days supply | Qty: 90 | Fill #0

## 2019-09-22 MED FILL — MYRBETRIQ ER 25 MG TABLET: 25 | 90 days supply | Qty: 90 | Fill #0

## 2019-09-22 MED FILL — LETROZOLE 2.5 MG TABS: 2.5 | 90 days supply | Qty: 90 | Fill #0

## 2019-09-22 MED FILL — hydrOXYzine HCL 10 MG TABS: 10 | 90 days supply | Qty: 90 | Fill #0

## 2019-09-22 MED FILL — SOLIFENACIN SUCCINATE 10 MG: 10 | 90 days supply | Qty: 90 | Fill #0

## 2019-09-22 MED FILL — LISINOPRIL 10 MG TABS: 10 | 90 days supply | Qty: 90 | Fill #0

## 2019-09-22 MED FILL — VALACYCLOVIR HCL 500 MG TAB: 500 | 90 days supply | Qty: 180 | Fill #0

## 2019-09-23 MED FILL — SSD 1% CREAM: 1 | 30 days supply | Qty: 400 | Fill #0

## 2019-09-29 ENCOUNTER — Ambulatory Visit (INDEPENDENT_AMBULATORY_CARE_PROVIDER_SITE_OTHER): Payer: Medicare Other | Admitting: Bariatrics

## 2019-09-29 ENCOUNTER — Other Ambulatory Visit: Payer: Self-pay

## 2019-09-29 ENCOUNTER — Encounter (INDEPENDENT_AMBULATORY_CARE_PROVIDER_SITE_OTHER): Payer: Self-pay | Admitting: Bariatrics

## 2019-09-29 VITALS — BP 125/85 | HR 58 | Temp 98.4°F | Ht 61.0 in | Wt 206.0 lb

## 2019-09-29 DIAGNOSIS — E8881 Metabolic syndrome: Secondary | ICD-10-CM | POA: Diagnosis not present

## 2019-09-29 DIAGNOSIS — Z6838 Body mass index (BMI) 38.0-38.9, adult: Secondary | ICD-10-CM

## 2019-09-29 DIAGNOSIS — E559 Vitamin D deficiency, unspecified: Secondary | ICD-10-CM

## 2019-09-29 MED ORDER — VITAMIN D3 1.25 MG (50000 UT) PO CAPS: 1.0000 | ORAL_CAPSULE | ORAL | 0 refills | Status: DC

## 2019-09-29 NOTE — Progress Notes (Signed)
Chief Complaint:   OBESITY Janelly Fazel is here to discuss her progress with her obesity treatment plan along with follow-up of her obesity related diagnoses. Oshea is keeping a food journal and adhering to recommended goals of 1200 calories and 80-90 grams of protein and states she is following her eating plan approximately 20% of the time. Brittanyann states she is walking 60 minutes 1 time per week.  Today's visit was #: 20 Starting weight: 204 lbs Starting date: 02/18/2018 Today's weight: 206 lbs Today's date: 09/29/2019 Total lbs lost to date: 0 Total lbs lost since last in-office visit: 0  Interim History: Markea is up 8 lbs. Her knee has been hurting. She reports skipping meals.  Subjective:   Vitamin D deficiency. No nausea, vomiting, or muscle weakness. Last Vitamin D 40.6 on 06/09/2019.  Insulin resistance. Karai has a diagnosis of insulin resistance based on her elevated fasting insulin level >5. She continues to work on diet and exercise to decrease her risk of diabetes. She is on no medication.  Lab Results  Component Value Date   INSULIN 7.0 06/09/2019   INSULIN 4.9 02/18/2018   Lab Results  Component Value Date   HGBA1C 5.5 06/09/2019   Assessment/Plan:   Vitamin D deficiency. Low Vitamin D level contributes to fatigue and are associated with obesity, breast, and colon cancer. She was given a prescription for Cholecalciferol (VITAMIN D3) 1.25 MG (50000 UT) CAPS every 14 days #5 with 0 refills and will follow-up for routine testing of Vitamin D, at least 2-3 times per year to avoid over-replacement.     Insulin resistance. Iyahna will continue to work on weight loss, increasing exercise, increasing protein, and decreasing simple carbohydrates to help decrease the risk of diabetes. Afnan agreed to follow-up with [name] as directed to closely monitor her progress.  Class 2 severe obesity with serious comorbidity and body mass index (BMI) of 38.0  to 38.9 in adult, unspecified obesity type ([place]).  Andriette is currently in the action stage of change. As such, her goal is to continue with weight loss efforts. She has agreed to keeping a food journal and adhering to recommended goals of 1200 calories and 80-90 grams of protein.   She will work on meal planning, intentional eating, increasing her protein intake, and trying not to skip meals.  Exercise goals: All adults should avoid inactivity. Some physical activity is better than none, and adults who participate in any amount of physical activity gain some health benefits.  Behavioral modification strategies: increasing lean protein intake, decreasing simple carbohydrates, increasing vegetables, increasing water intake, decreasing eating out, no skipping meals, meal planning and cooking strategies and keeping healthy foods in the home.  Xitllali has agreed to follow-up with our clinic in 8 weeks. She was informed of the importance of frequent follow-up visits to maximize her success with intensive lifestyle modifications for her multiple health conditions.   Objective:   Blood pressure 125/85, pulse (!) 58, temperature 98.4 F (36.9 C), height 5\' 1"  (1.549 m), weight 206 lb (93.4 kg), SpO2 99 %. Body mass index is 38.92 kg/m.  General: Cooperative, alert, well developed, in no acute distress. HEENT: Conjunctivae and lids unremarkable. Cardiovascular: Regular rhythm.  Lungs: Normal work of breathing. Neurologic: No focal deficits.   Lab Results  Component Value Date   CREATININE 0.84 06/09/2019   BUN 16 06/09/2019   NA 142 06/09/2019   K 5.0 06/09/2019   CL 102 06/09/2019   CO2 25 06/09/2019  Lab Results  Component Value Date   ALT 8 06/09/2019   AST 17 06/09/2019   ALKPHOS 99 06/09/2019   BILITOT 0.4 06/09/2019   Lab Results  Component Value Date   HGBA1C 5.5 06/09/2019   HGBA1C 5.6 12/17/2018   HGBA1C 5.7 (H) 06/10/2018   HGBA1C 5.8 (H) 02/18/2018   Lab Results    Component Value Date   INSULIN 7.0 06/09/2019   INSULIN 4.9 02/18/2018   Lab Results  Component Value Date   TSH 0.802 02/18/2018   Lab Results  Component Value Date   CHOL 210 (H) 11/07/2018   HDL 84 11/07/2018   LDLCALC 114 (H) 11/07/2018   TRIG 61 11/07/2018   CHOLHDL 2.5 11/07/2018   Lab Results  Component Value Date   WBC 4.5 06/09/2019   HGB 12.2 06/09/2019   HCT 36.8 06/09/2019   MCV 95 06/09/2019   PLT 234 06/09/2019   No results found for: IRON, TIBC, FERRITIN  Obesity Behavioral Intervention Documentation for Insurance:   Approximately 15 minutes were spent on the discussion below.  ASK: We discussed the diagnosis of obesity with [name] today and Melisa agreed to give [name] permission to discuss obesity behavioral modification therapy today.  ASSESS: Kikuko has the diagnosis of obesity and her BMI today is 38.9. Verba is in the action stage of change.   ADVISE: Gabbriel was educated on the multiple health risks of obesity as well as the benefit of weight loss to improve her health. She was advised of the need for long term treatment and the importance of lifestyle modifications to improve her current health and to decrease her risk of future health problems.  AGREE: Multiple dietary modification options and treatment options were discussed and Akaisha agreed to follow the recommendations documented in the above note.  ARRANGE: Dorene was educated on the importance of frequent visits to treat obesity as outlined per CMS and USPSTF guidelines and agreed to schedule her next follow up appointment today.  Attestation Statements:   Reviewed by clinician on day of visit: allergies, medications, problem list, medical history, surgical history, family history, social history, and previous encounter notes.  Migdalia Dk, am acting as Location manager for [employer], DO   I have reviewed the above documentation for accuracy and completeness, and I  agree with the above. Jearld Lesch, DO

## 2019-10-07 MED FILL — VIT D3-50 50,000 UNITS CAPS: 1.25 MG | 70 days supply | Qty: 5 | Fill #0

## 2019-10-08 DIAGNOSIS — M25562 Pain in left knee: Secondary | ICD-10-CM | POA: Diagnosis not present

## 2019-10-09 DIAGNOSIS — M25562 Pain in left knee: Secondary | ICD-10-CM | POA: Diagnosis not present

## 2019-11-03 DIAGNOSIS — H04123 Dry eye syndrome of bilateral lacrimal glands: Secondary | ICD-10-CM | POA: Diagnosis not present

## 2019-11-03 DIAGNOSIS — H53001 Unspecified amblyopia, right eye: Secondary | ICD-10-CM | POA: Diagnosis not present

## 2019-11-11 DIAGNOSIS — M25562 Pain in left knee: Secondary | ICD-10-CM | POA: Diagnosis not present

## 2019-11-11 DIAGNOSIS — Z0120 Encounter for dental examination and cleaning without abnormal findings: Secondary | ICD-10-CM | POA: Diagnosis not present

## 2019-11-11 DIAGNOSIS — M25561 Pain in right knee: Secondary | ICD-10-CM | POA: Diagnosis not present

## 2019-11-11 DIAGNOSIS — M170 Bilateral primary osteoarthritis of knee: Secondary | ICD-10-CM | POA: Diagnosis not present

## 2019-11-11 DIAGNOSIS — M1711 Unilateral primary osteoarthritis, right knee: Secondary | ICD-10-CM | POA: Diagnosis not present

## 2019-11-26 ENCOUNTER — Encounter (INDEPENDENT_AMBULATORY_CARE_PROVIDER_SITE_OTHER): Payer: Self-pay | Admitting: Bariatrics

## 2019-11-26 ENCOUNTER — Ambulatory Visit (INDEPENDENT_AMBULATORY_CARE_PROVIDER_SITE_OTHER): Payer: Medicare Other | Admitting: Bariatrics

## 2019-11-26 ENCOUNTER — Other Ambulatory Visit: Payer: Self-pay

## 2019-11-26 VITALS — BP 118/74 | HR 59 | Temp 98.8°F | Ht 61.0 in | Wt 198.0 lb

## 2019-11-26 DIAGNOSIS — Z6837 Body mass index (BMI) 37.0-37.9, adult: Secondary | ICD-10-CM | POA: Diagnosis not present

## 2019-11-26 DIAGNOSIS — E8881 Metabolic syndrome: Secondary | ICD-10-CM | POA: Diagnosis not present

## 2019-11-26 DIAGNOSIS — Z9884 Bariatric surgery status: Secondary | ICD-10-CM | POA: Diagnosis not present

## 2019-11-26 DIAGNOSIS — E559 Vitamin D deficiency, unspecified: Secondary | ICD-10-CM | POA: Diagnosis not present

## 2019-11-26 DIAGNOSIS — E7800 Pure hypercholesterolemia, unspecified: Secondary | ICD-10-CM | POA: Diagnosis not present

## 2019-11-26 NOTE — Progress Notes (Signed)
Chief Complaint:   OBESITY [name] is here to discuss her progress with her obesity treatment plan along with follow-up of her obesity related diagnoses. [name] is keeping a food journal and adhering to recommended goals of 1200 calories and 80-90 grams of protein and states she is following her eating plan approximately 100% of the time. [name] states she is walking 20-30 minutes 1 time per week.  Today's visit was #: 21 Starting weight: 204 lbs Starting date: 02/18/2018 Today's weight: 198 lbs Today's date: 11/26/2019 Total lbs lost to date: 6 Total lbs lost since last in-office visit: 8  Interim History: [name] is down 8 lbs. She is planning on having left total knee replacement. Her younger sister was diagnosed with breast cancer for the second time.   Subjective:   Vitamin D deficiency. No nausea, vomiting, or muscle weakness.    Ref. Range 06/09/2019 12:20  Vitamin D, 25-Hydroxy Latest Ref Range: 30.0 - 100.0 ng/mL 40.6   H/O gastric bypass. [name] has minimal restriction.  Insulin resistance. [name] has a diagnosis of insulin resistance based on her elevated fasting insulin level >5. She continues to work on diet and exercise to decrease her risk of diabetes. No polyphagia.  Lab Results  Component Value Date   INSULIN 7.0 06/09/2019   INSULIN 4.9 02/18/2018   Lab Results  Component Value Date   HGBA1C 5.5 06/09/2019   Elevated cholesterol. Last total cholesterol 210 on 11/07/2018. [name] is on no medication.  Assessment/Plan:   Vitamin D deficiency. Low Vitamin D level contributes to fatigue and are associated with obesity, breast, and colon cancer. She agrees to continue to take prescription Vitamin D @50 ,000 IU every week #4 with 0 refills and VITAMIN D 25 Hydroxy (Vit-D Deficiency, Fractures) level was ordered today.  H/O gastric bypass. Vitamin B12, Vitamin B1, Vitamin B2, Whole Blood labs ordered today.  Insulin resistance.  [name] will continue to work on weight loss, exercise, increasing healthy fats and protein, and decreasing simple carbohydrates to help decrease the risk of diabetes. [name] agreed to follow-up with [name] as directed to closely monitor her progress. Comprehensive metabolic panel, Hemoglobin A1c, Insulin, random labs ordered today.  Elevated cholesterol. Lipid Panel With LDL/HDL Ratio ordered today.  Class 2 severe obesity due to excess calories with serious comorbidity and body mass index (BMI) of 37.0 to 37.9 in adult [name]).  [name] is currently in the action stage of change. As such, her goal is to continue with weight loss efforts. She has agreed to keeping a food journal and adhering to recommended goals of 1200 calories and 80-90 grams of protein alternating with PC/[name].   She will work on meal planning and intentional eating. She will continue to use the My Fitness Pal app.  Exercise goals: All adults should avoid inactivity. Some physical activity is better than none, and adults who participate in any amount of physical activity gain some health benefits.  Behavioral modification strategies: increasing lean protein intake, decreasing simple carbohydrates, increasing vegetables, increasing water intake, decreasing eating out, no skipping meals, meal planning and cooking strategies, keeping healthy foods in the home and planning for success.  [name] has agreed to follow-up with our clinic in 3 weeks. She was informed of the importance of frequent follow-up visits to maximize her success with intensive lifestyle modifications for her multiple health conditions.   [name] was informed we would discuss her lab results at her next visit unless there is a critical issue that needs  to be addressed sooner. [name] agreed to keep her next visit at the agreed upon time to discuss these results.  Objective:   Blood pressure 118/74, pulse (!) 59, temperature 98.8 F (37.1 C), height 5\' 1"  (1.549  m), weight 198 lb (89.8 kg), SpO2 99 %. Body mass index is 37.41 kg/m.  General: Cooperative, alert, well developed, in no acute distress. HEENT: Conjunctivae and lids unremarkable. Cardiovascular: Regular rhythm.  Lungs: Normal work of breathing. Neurologic: No focal deficits. Pt uses a cane for ambulation.  Lab Results  Component Value Date   CREATININE 0.84 06/09/2019   BUN 16 06/09/2019   NA 142 06/09/2019   K 5.0 06/09/2019   CL 102 06/09/2019   CO2 25 06/09/2019   Lab Results  Component Value Date   ALT 8 06/09/2019   AST 17 06/09/2019   ALKPHOS 99 06/09/2019   BILITOT 0.4 06/09/2019   Lab Results  Component Value Date   HGBA1C 5.5 06/09/2019   HGBA1C 5.6 12/17/2018   HGBA1C 5.7 (H) 06/10/2018   HGBA1C 5.8 (H) 02/18/2018   Lab Results  Component Value Date   INSULIN 7.0 06/09/2019   INSULIN 4.9 02/18/2018   Lab Results  Component Value Date   TSH 0.802 02/18/2018   Lab Results  Component Value Date   CHOL 210 (H) 11/07/2018   HDL 84 11/07/2018   LDLCALC 114 (H) 11/07/2018   TRIG 61 11/07/2018   CHOLHDL 2.5 11/07/2018   Lab Results  Component Value Date   WBC 4.5 06/09/2019   HGB 12.2 06/09/2019   HCT 36.8 06/09/2019   MCV 95 06/09/2019   PLT 234 06/09/2019   No results found for: IRON, TIBC, FERRITIN  Obesity Behavioral Intervention Documentation for Insurance:   Approximately 15 minutes were spent on the discussion below.  ASK: We discussed the diagnosis of obesity with [name] today and [name] agreed to give [name] permission to discuss obesity behavioral modification therapy today.  ASSESS: [name] has the diagnosis of obesity and her BMI today is 37.4. [name] is in the action stage of change.   ADVISE: [name] was educated on the multiple health risks of obesity as well as the benefit of weight loss to improve her health. She was advised of the need for long term treatment and the importance of lifestyle modifications to improve her  current health and to decrease her risk of future health problems.  AGREE: Multiple dietary modification options and treatment options were discussed and [name] agreed to follow the recommendations documented in the above note.  ARRANGE: [name] was educated on the importance of frequent visits to treat obesity as outlined per CMS and USPSTF guidelines and agreed to schedule her next follow up appointment today.  Attestation Statements:   Reviewed by clinician on day of visit: allergies, medications, problem list, medical history, surgical history, family history, social history, and previous encounter notes.  Migdalia Dk, am acting as Location manager for [employer], DO   I have reviewed the above documentation for accuracy and completeness, and I agree with the above. Jearld Lesch, DO

## 2019-12-01 LAB — COMPREHENSIVE METABOLIC PANEL
ALT: 11 IU/L (ref 0–32)
AST: 14 IU/L (ref 0–40)
Albumin/Globulin Ratio: 1.6 (ref 1.2–2.2)
Albumin: 4.3 g/dL (ref 3.8–4.9)
Alkaline Phosphatase: 83 IU/L (ref 48–121)
BUN/Creatinine Ratio: 22 (ref 9–23)
BUN: 15 mg/dL (ref 6–24)
Bilirubin Total: 0.3 mg/dL (ref 0.0–1.2)
CO2: 26 mmol/L (ref 20–29)
Calcium: 9.7 mg/dL (ref 8.7–10.2)
Chloride: 105 mmol/L (ref 96–106)
Creatinine, Ser: 0.69 mg/dL (ref 0.57–1.00)
GFR calc Af Amer: 112 mL/min/{1.73_m2} (ref >59)
GFR calc non Af Amer: 97 mL/min/{1.73_m2} (ref >59)
Globulin, Total: 2.7 g/dL (ref 1.5–4.5)
Glucose: 92 mg/dL (ref 65–99)
Potassium: 4.8 mmol/L (ref 3.5–5.2)
Sodium: 145 mmol/L — ABNORMAL HIGH (ref 134–144)
Total Protein: 7 g/dL (ref 6.0–8.5)

## 2019-12-01 LAB — HEMOGLOBIN A1C
Est. average glucose Bld gHb Est-mCnc: 117 mg/dL
Hgb A1c MFr Bld: 5.7 % — ABNORMAL HIGH (ref 4.8–5.6)

## 2019-12-01 LAB — VITAMIN B12: Vitamin B-12: 387 pg/mL (ref 232–1245)

## 2019-12-01 LAB — LIPID PANEL WITH LDL/HDL RATIO
Cholesterol, Total: 218 mg/dL — ABNORMAL HIGH (ref 100–199)
HDL: 82 mg/dL (ref >39)
LDL Chol Calc (NIH): 123 mg/dL — ABNORMAL HIGH (ref 0–99)
LDL/HDL Ratio: 1.5 ratio (ref 0.0–3.2)
Triglycerides: 72 mg/dL (ref 0–149)
VLDL Cholesterol Cal: 13 mg/dL (ref 5–40)

## 2019-12-01 LAB — VITAMIN B1: Thiamine: 102.3 nmol/L (ref 66.5–200.0)

## 2019-12-01 LAB — INSULIN, RANDOM: INSULIN: 3.8 u[IU]/mL (ref 2.6–24.9)

## 2019-12-01 LAB — VITAMIN B2, WHOLE BLOOD: Vitamin B2, Whole Blood: 163 ug/L (ref 137–370)

## 2019-12-01 LAB — VITAMIN D 25 HYDROXY (VIT D DEFICIENCY, FRACTURES): Vit D, 25-Hydroxy: 40.5 ng/mL (ref 30.0–100.0)

## 2019-12-08 ENCOUNTER — Encounter (INDEPENDENT_AMBULATORY_CARE_PROVIDER_SITE_OTHER): Payer: Self-pay | Admitting: Bariatrics

## 2019-12-08 DIAGNOSIS — E559 Vitamin D deficiency, unspecified: Secondary | ICD-10-CM

## 2019-12-08 MED ORDER — VITAMIN D3 1.25 MG (50000 UT) PO CAPS: 1.0000 | ORAL_CAPSULE | ORAL | 0 refills | Status: DC

## 2019-12-08 MED FILL — VIT D3-50 50,000 UNITS CAPS: 1.25 MG | 35 days supply | Qty: 5 | Fill #0

## 2019-12-08 NOTE — Telephone Encounter (Signed)
Please review

## 2019-12-24 ENCOUNTER — Other Ambulatory Visit: Payer: Self-pay

## 2019-12-24 ENCOUNTER — Ambulatory Visit: Payer: Medicare Other | Admitting: Cardiovascular Disease

## 2019-12-24 ENCOUNTER — Encounter: Payer: Self-pay | Admitting: Cardiovascular Disease

## 2019-12-24 VITALS — BP 112/68 | HR 61 | Ht 61.0 in | Wt 203.6 lb

## 2019-12-24 DIAGNOSIS — E7800 Pure hypercholesterolemia, unspecified: Secondary | ICD-10-CM

## 2019-12-24 DIAGNOSIS — I700 Atherosclerosis of aorta: Secondary | ICD-10-CM

## 2019-12-24 DIAGNOSIS — Z853 Personal history of malignant neoplasm of breast: Secondary | ICD-10-CM

## 2019-12-24 DIAGNOSIS — Z8673 Personal history of transient ischemic attack (TIA), and cerebral infarction without residual deficits: Secondary | ICD-10-CM | POA: Diagnosis not present

## 2019-12-24 DIAGNOSIS — I82409 Acute embolism and thrombosis of unspecified deep veins of unspecified lower extremity: Secondary | ICD-10-CM | POA: Diagnosis not present

## 2019-12-24 DIAGNOSIS — I872 Venous insufficiency (chronic) (peripheral): Secondary | ICD-10-CM | POA: Diagnosis not present

## 2019-12-24 DIAGNOSIS — Z95828 Presence of other vascular implants and grafts: Secondary | ICD-10-CM

## 2019-12-24 NOTE — Patient Instructions (Signed)

## 2019-12-24 NOTE — Progress Notes (Signed)
Cardiology Office Note:    Date:  12/27/2019   ID:  [name], DOB [birth year], MRN [id number]  PCP:  [name], MD  Cardiologist:  Sanda Klein, MD  Electrophysiologist:  None   Referring MD: [name], MD   Chief Complaint  Patient presents with  . Edema  History of pulmonary embolism, paradoxical embolism stroke, inferior vena cava filter  History of Present Illness:    [name] is a 59 y.o. 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  She has normal left ventricular systolic function with "moderate diastolic dysfunction" by echo in May 2018 (on my review I would have said that she has normal diastolic function), normal coronary arteries by cardiac catheterization in 2003, normal nuclear stress test in 2017, normal ABIs in 2012.  Abdominal CT in 2012 did show moderate atheromatous calcification in the infrarenal aorta, without any significant arterial obstruction in the lower extremities and three-vessel runoff bilaterally.  I am not entirely sure, but it seems that the inferior vena cava filter was implanted prophylactically before her gastric bypass surgery since she had a history of pulmonary embolism, paradoxical stroke and upper extremity DVT.  She was on anticoagulation with warfarin until 2009, only on aspirin since then without any recurrent arterial or venous thromboembolic events.  Biggest complaints are related to her knees, especially on the left side.  They are both "bone-on-bone" and she may need knee replacement surgery.  She walks with a cane.  She has mild ankle edema that usually resolves  after lying in bed overnight, although not always.  She denies chest pain, dyspnea, palpitations, dizziness, syncope, focal neurological events or intermittent claudication.  Chest pain has not been a complaint recently.  She does not have diabetes mellitus and has only mild hypercholesterolemia her most recent LDL cholesterol checked just a few days ago was around 123.  She has never smoked  Past Medical History:  Diagnosis Date  . Anemia    since bypass  . Arthritis    osteoarthritis  . Asthma    states no asthma attack since 2002  . Back pain   . Breast cancer ([place]) 06/26/16 bx   left breast  . Breast cancer ([place])   . Chronic back pain   . Complication of anesthesia    states takes more than normal to put her to sleep  . Constipation   . Dental bridge present    upper  . Dental crowns present   . DVT of upper extremity (deep vein thrombosis) ([place])   . Fibromyalgia   . Genetic testing 09/19/2016   Ms. Oyervides underwent genetic counseling and testing for hereditary cancer syndromes on 08/21/2016. Her results were negative for mutations in all 46 genes analyzed by [facility]'s 46-gene Common Hereditary Cancers Panel. Genes analyzed include: APC, ATM, AXIN2, BARD1, BMPR1A, BRCA1, BRCA2, BRIP1, CDH1, CDKN2A, CHEK2, CTNNA1, DICER1, EPCAM, GREM1, HOXB13, KIT, MEN1, MLH1, MSH2, MSH3, MSH6, MUTYH, NBN,   . Headache(784.0)    migraines  . History of blood transfusion 06/2005  . History of gallstones   . History of pneumonia   . History of shingles 07/2011  . History of shingles   . HTN (hypertension)   .  Itching   . Joint pain   . Muscle weakness   . Neuropathy   . Normal coronary arteries 2003  . Osteoarthropathy   . Personal history of chemotherapy 2018  . Personal history of radiation therapy 2018  . PFO (patent foramen ovale)   . Presence of inferior vena cava filter   . Pulmonary embolism ([place])   . Sleep apnea    used CPAP until after bypass surg.  . Status post gastric bypass for  obesity   . Stomach pain   . Stroke [facility]) 12/2002   right-sided weakness  . Urinary incontinence     Past Surgical History:  Procedure Laterality Date  . ABDOMINAL HYSTERECTOMY  11/1997   complete  . ANAL RECTAL MANOMETRY N/A 01/21/2019   Procedure: ANO RECTAL MANOMETRY;  Surgeon: Arta Silence, MD;  Location: [facility];  Service: Endoscopy;  Laterality: N/A;  . ANTERIOR CERVICAL DECOMP/DISCECTOMY FUSION  02/05/2005   C5-6  . APPENDECTOMY  10/22/2008   laparoscopic  . BREAST LUMPECTOMY Left 07/2016  . BREAST LUMPECTOMY WITH RADIOACTIVE SEED AND SENTINEL LYMPH NODE BIOPSY Left 07/26/2016   Procedure: BREAST LUMPECTOMY WITH RADIOACTIVE SEED AND SENTINEL LYMPH NODE BIOPSY;  Surgeon: Erroll Luna, MD;  Location: [place];  Service: General;  Laterality: Left;  . BUNIONECTOMY  05/1980   both feet  . BUNIONECTOMY  08/2011   left foot  . CARDIAC CATHETERIZATION  03/04/2002  . CARPAL TUNNEL RELEASE  06/21/2009   right  . CARPAL TUNNEL RELEASE     left hand  . CARPAL TUNNEL RELEASE  10/03/2011   Procedure: CARPAL TUNNEL RELEASE;  Surgeon: Wynonia Sours, MD;  Location: [place];  Service: Orthopedics;  Laterality: Right;  CARPAL TUNNEL WITH HYPOTHENAR FAT PAD TRANSFER  . CERVICAL SPINE SURGERY  01/2005   titanium plate implanted  . CHOLECYSTECTOMY  1990  . ELBOW SURGERY  08/09/2004   decompression ulnar nerve right elbow  . ENTEROLYSIS  10/22/2008   laparoscopic abd. enterolysis  . GASTRIC ROUX-EN-Y  2009  . HEEL SPUR SURGERY  08/1997   left  . HEMORRHOID SURGERY  03/1993  . LAPAROSCOPIC LYSIS INTESTINAL ADHESIONS  02/14/2000  . NAILBED REPAIR  01/10/2005; 08/2011   exc. matrix bilat. great toe  . OTHER SURGICAL HISTORY  12/1986   pt states that she had surgery to unclog her fallopean tubes  . PORTACATH PLACEMENT N/A 09/24/2016   Procedure: INSERTION PORT-A-CATH WITH [name];  Surgeon: Erroll Luna, MD;  Location: [place];  Service: General;  Laterality: N/A;  .  SHOULDER SURGERY     bilat. - (left:  06/2005)  . TONSILLECTOMY  07/1995  . TRIGGER FINGER RELEASE  04/25/2006   decompression A-1 pulley left thumb  . TRIGGER FINGER RELEASE Right 11/11/2018   Procedure: RELEASE TRIGGER FINGER/A-1 PULLEY RIGHT THUMB, RIGHT RING FINGER;  Surgeon: Daryll Brod, MD;  Location: [place];  Service: Orthopedics;  Laterality: Right;  . UTERINE FIBROID SURGERY  12/95, 7/96   x2  . VENA CAVA FILTER PLACEMENT  2009   during Roux-en-Y surg.    Current Medications: Current Meds  Medication Sig  . albuterol (PROAIR HFA) 108 (90 Base) MCG/ACT inhaler Inhale 2 puffs into the lungs every 6 (six) hours as needed for wheezing or shortness of breath.  Marland Kitchen aspirin EC 81 MG tablet Take 81 mg by mouth daily.  . Calcium Citrate-Vitamin D (CALCIUM CITRATE +D PO) Take 2 tablets by mouth daily. Calcium 600 mg   .  Cholecalciferol (VITAMIN D3) 1.25 MG (50000 UT) CAPS Take 1 capsule by mouth every 14 (fourteen) days.  . hydrOXYzine (ATARAX/VISTARIL) 25 MG tablet Take 25 mg by mouth 3 (three) times daily as needed.  Marland Kitchen letrozole (FEMARA) 2.5 MG tablet Take 1 tablet (2.5 mg total) by mouth daily.  . Methocarbamol (ROBAXIN-750 PO) Robaxin  . mirabegron ER (MYRBETRIQ) 25 MG TB24 tablet Take 25 mg by mouth daily.  . mometasone (ELOCON) 0.1 % cream Apply 1 application topically daily as needed (rash - summer eczema). Apply to arms  . Multiple Vitamin (MULTIVITAMIN WITH MINERALS) TABS tablet Take 1 tablet by mouth at bedtime.  . silver sulfADIAZINE (SILVADENE) 1 % cream Apply 1 application topically 2 (two) times daily as needed (stomach tears).  . solifenacin (VESICARE) 10 MG tablet   . valACYclovir (VALTREX) 1000 MG tablet Take 1 tablet (1,000 mg total) by mouth 3 (three) times daily.     Allergies:   Aspirin, Oxycodone hcl, Propoxyphene n-acetaminophen, Tramadol, Adhesive [tape], and Prednisone   Social History   Socioeconomic History  . Marital status: Single     Spouse name: Not on file  . Number of children: 0  . Years of education: Not on file  . Highest education level: Not on file  Occupational History  . Occupation: disabled  Tobacco Use  . Smoking status: Former Research scientist (life sciences)  . Smokeless tobacco: Never Used  . Tobacco comment: quit smoking 08/1989  Substance and Sexual Activity  . Alcohol use: No  . Drug use: No  . Sexual activity: Not Currently    Birth control/protection: Surgical  Other Topics Concern  . Not on file  Social History Narrative  . Not on file   Social Determinants of Health   Financial Resource Strain:   . Difficulty of Paying Living Expenses:   Food Insecurity:   . Worried About Charity fundraiser in the Last Year:   . Arboriculturist in the Last Year:   Transportation Needs:   . Film/video editor (Medical):   Marland Kitchen Lack of Transportation (Non-Medical):   Physical Activity:   . Days of Exercise per Week:   . Minutes of Exercise per Session:   Stress:   . Feeling of Stress :   Social Connections:   . Frequency of Communication with Friends and Family:   . Frequency of Social Gatherings with Friends and Family:   . Attends Religious Services:   . Active Member of Clubs or Organizations:   . Attends Archivist Meetings:   Marland Kitchen Marital Status:      Family History: The patient's family history includes Breast cancer in her maternal grandmother, mother, and sister; Breast cancer (age of onset: 49) in her paternal aunt; Breast cancer (age of onset: 67) in her sister; Cervical cancer (age of onset: 2) in her paternal grandmother; Colon polyps in her father; Diabetes in her father; High blood pressure in her father; Hypertension in her mother and another family member; Ovarian cancer (age of onset: 18) in her maternal grandmother; Prostate cancer in her father.  ROS:   Please see the history of present illness.    All other systems are reviewed and are negative.  EKGs/Labs/Other Studies Reviewed:    The  following studies were reviewed today: Reviewed echo from 2017, nuclear stress test 2017, lower extremity arterial Dopplers 2012, multiple CT studies including CT of the abdomen, CT of the chest, CTs of the brain and MRI of the brain in 2004  EKG:  EKG is ordered today and shows normal sinus rhythm, nonspecific ST-T changes, QTC 4 and 63 ms.  Not meaningfully changed from previous tracings.  Recent Labs: 06/09/2019: Hemoglobin 12.2; Platelets 234 11/26/2019: ALT 11; BUN 15; Creatinine, Ser 0.69; Potassium 4.8; Sodium 145  Recent Lipid Panel    Component Value Date/Time   CHOL 218 (H) 11/26/2019 1359   TRIG 72 11/26/2019 1359   HDL 82 11/26/2019 1359   CHOLHDL 2.5 11/07/2018 0914   CHOLHDL 2.6 09/30/2015 1838   VLDL 12 09/30/2015 1838   LDLCALC 123 (H) 11/26/2019 1359    Physical Exam:    VS:  BP 112/68   Pulse 61   Ht 5\' 1"  (1.549 m)   Wt 203 lb 9.6 oz (92.4 kg)   SpO2 100%   BMI 38.47 kg/m     Wt Readings from Last 3 Encounters:  12/24/19 203 lb 9.6 oz (92.4 kg)  11/26/19 198 lb (89.8 kg)  09/29/19 206 lb (93.4 kg)     General: Alert, oriented x3, no distress, severely obese Head: no evidence of trauma, PERRL, EOMI, no exophtalmos or lid lag, no myxedema, no xanthelasma; normal ears, nose and oropharynx Neck: normal jugular venous pulsations and no hepatojugular reflux; brisk carotid pulses without delay and no carotid bruits Chest: clear to auscultation, no signs of consolidation by percussion or palpation, normal fremitus, symmetrical and full respiratory excursions Cardiovascular: normal position and quality of the apical impulse, regular rhythm, normal first and second heart sounds, no murmurs, rubs or gallops Abdomen: no tenderness or distention, no masses by palpation, no abnormal pulsatility or arterial bruits, normal bowel sounds, no hepatosplenomegaly Extremities: no clubbing, cyanosis or edema; 2+ radial, ulnar and brachial pulses bilaterally; 2+ right femoral,  posterior tibial and dorsalis pedis pulses; 2+ left femoral, posterior tibial and dorsalis pedis pulses; no subclavian or femoral bruits Neurological: grossly nonfocal Psych: Normal mood and affect   ASSESSMENT:    1. Atherosclerosis of aorta (HCC)   2. Venous insufficiency   3. History of stroke   4. Recurrent deep venous thrombosis (HCC)   5. Presence of IVC filter   6. Hypercholesterolemia   7. Severe obesity (BMI 35.0-39.9) with comorbidity (HCC)   8. History of breast cancer    PLAN:    In order of problems listed above:  1. Edema: Likely related to residual venous insufficiency from previous thromboembolic events.  Not severe. 2. History of stroke/suspected paradoxical embolism due to PFO: Without neurological events in 17 years.  She is currently only on aspirin.  She has a history of atrial flutter, but excellent the texture from small paradoxical emboli can cause stroke.  At this point it is difficult to argue for PFO closure such a long period of time without events.  Any potential embolic event, even a TIA should prompt reconsideration of PFO closure with a device.  Note that we would have to traverse her inferior vena cava filter from a femoral approach, but the device could be deployed via subclavian approach as well. 3. 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  No recent events and only on aspirin.  As above, any recurrent venous thromboembolic event would probably prompt an indication for lifelong anticoagulation. 4.  IVC filter: At this point this is likely completely endothelialized and will be difficult to remove; hopefully also less likely to be a nidus for thrombus formation. 5. Ao atherosclerosis: She has moderate nonobstructive atheromatous disease in the abdominal aorta, but no evidence  of meaningful disease in the coronaries, carotids, thoracic aorta or lower extremity arterial system on multiple past imaging studies.  Target LDL  cholesterol less than 70, but she does not want to take more medications.  She does have an excellent HDL cholesterol. 6.  HLP: She has a mildly elevated LDL cholesterol but this is balanced by an exceptionally high level of HDL.  In the absence of significant vascular disease would not recommend statin therapy.  She has predominantly buttock and thigh obesity, without that much in the way of abdominal fat, suggesting a less aggressive genetic/metabolic risk for vascular disease.  This is supported by the fact that she has excellent glucose levels despite morbid obesity. 7.  Obesity: reviewed diet changes to help weight loss.  She is not able to exercise due to her knee problems. 8. History of HTN: Questionable diagnosis.  She has a excellent blood pressure without any medications. 9. History of left breast cancer status post radiation therapy: While chest radiation can increase the likelihood of coronary disease, this is unlikely to show up for several more years in the future.    Medication Adjustments/Labs and Tests Ordered: Current medicines are reviewed at length with the patient today.  Concerns regarding medicines are outlined above.  Orders Placed This Encounter  Procedures  . EKG 12-Lead   No orders of the defined types were placed in this encounter.   Patient Instructions  Medication Instructions:  No changes *If you need a refill on your cardiac medications before your next appointment, please call your pharmacy*   Lab Work: None ordered If you have labs (blood work) drawn today and your tests are completely normal, you will receive your results only by: Marland Kitchen MyChart Message (if you have MyChart) OR . A paper copy in the mail If you have any lab test that is abnormal or we need to change your treatment, we will call you to review the results.   Testing/Procedures: None ordered   Follow-Up: At [facility], you and your health needs are our priority.  As part of our  continuing mission to provide you with exceptional heart care, we have created designated Provider Care Teams.  These Care Teams include your primary Cardiologist (physician) and Advanced Practice Providers (APPs -  Physician Assistants and Nurse Practitioners) who all work together to provide you with the care you need, when you need it.  We recommend signing up for the patient portal called "MyChart".  Sign up information is provided on this After Visit Summary.  MyChart is used to connect with patients for Virtual Visits (Telemedicine).  Patients are able to view lab/test results, encounter notes, upcoming appointments, etc.  Non-urgent messages can be sent to your provider as well.   To learn more about what you can do with MyChart, go to [email or web address].    Your next appointment:   12 month(s)  The format for your next appointment:   In Person  Provider:   You may see Sanda Klein, MD or one of the following Advanced Practice Providers on your designated Care Team:    Almyra Deforest, PA-C  Fabian Sharp, [place] or   Roby Lofts, PA-C        Signed, Sanda Klein, MD  12/27/2019 1:00 PM    [place]

## 2019-12-27 ENCOUNTER — Encounter: Payer: Self-pay | Admitting: Cardiovascular Disease

## 2020-01-07 DIAGNOSIS — Z0120 Encounter for dental examination and cleaning without abnormal findings: Secondary | ICD-10-CM | POA: Diagnosis not present

## 2020-01-07 MED FILL — AMOXICILLIN 500 MG CAPSULE: 500 | 8 days supply | Qty: 25 | Fill #0

## 2020-01-07 MED FILL — VIT D3-50 50,000 UNITS CAPS: 1.25 MG | 35 days supply | Qty: 5 | Fill #0

## 2020-01-15 DIAGNOSIS — Z853 Personal history of malignant neoplasm of breast: Secondary | ICD-10-CM | POA: Diagnosis not present

## 2020-01-22 MED FILL — SOLIFENACIN SUCCINATE 10 MG: 10 | 90 days supply | Qty: 90 | Fill #1

## 2020-02-19 ENCOUNTER — Other Ambulatory Visit (HOSPITAL_BASED_OUTPATIENT_CLINIC_OR_DEPARTMENT_OTHER): Payer: Self-pay | Admitting: Internal Medicine

## 2020-02-19 MED FILL — FLUARIX QUADRIVALENT 0.5 ML: 0.5 | 1 days supply | Qty: 1 | Fill #0

## 2020-02-29 ENCOUNTER — Other Ambulatory Visit (INDEPENDENT_AMBULATORY_CARE_PROVIDER_SITE_OTHER): Payer: Self-pay | Admitting: Bariatrics

## 2020-02-29 ENCOUNTER — Other Ambulatory Visit (HOSPITAL_BASED_OUTPATIENT_CLINIC_OR_DEPARTMENT_OTHER): Payer: Self-pay | Admitting: Family Medicine

## 2020-02-29 DIAGNOSIS — E559 Vitamin D deficiency, unspecified: Secondary | ICD-10-CM

## 2020-02-29 MED FILL — VIT D2 1.25 MG (50,000 UNIT: 1.25 MG | 84 days supply | Qty: 6 | Fill #0

## 2020-02-29 MED FILL — MYRBETRIQ ER 25 MG TABLET: 25 | 90 days supply | Qty: 90 | Fill #1

## 2020-02-29 MED FILL — LISINOPRIL 10 MG TABS: 10 | 90 days supply | Qty: 90 | Fill #1

## 2020-02-29 MED FILL — VALACYCLOVIR HCL 500 MG TAB: 500 | 90 days supply | Qty: 180 | Fill #1

## 2020-02-29 MED FILL — hydrOXYzine HCL 10 MG TABS: 10 | 90 days supply | Qty: 90 | Fill #1

## 2020-02-29 MED FILL — ALBUTEROL SULFATE HFA 108 (: 108 (90 BAS | 17 days supply | Qty: 9 | Fill #0

## 2020-02-29 MED FILL — LETROZOLE 2.5 MG TABS: 2.5 | 90 days supply | Qty: 90 | Fill #1

## 2020-02-29 MED FILL — SSD 1% CREAM: 1 | 30 days supply | Qty: 400 | Fill #1

## 2020-02-29 MED FILL — METHOCARBAMOL 750 MG TABS: 750 | 90 days supply | Qty: 90 | Fill #1

## 2020-03-01 ENCOUNTER — Ambulatory Visit (INDEPENDENT_AMBULATORY_CARE_PROVIDER_SITE_OTHER): Payer: Medicare Other | Admitting: Bariatrics

## 2020-03-03 ENCOUNTER — Other Ambulatory Visit: Payer: Self-pay | Admitting: Hematology and Oncology

## 2020-03-03 ENCOUNTER — Other Ambulatory Visit (HOSPITAL_BASED_OUTPATIENT_CLINIC_OR_DEPARTMENT_OTHER): Payer: Self-pay | Admitting: Orthopedic Surgery

## 2020-03-03 DIAGNOSIS — M25512 Pain in left shoulder: Secondary | ICD-10-CM | POA: Diagnosis not present

## 2020-03-03 DIAGNOSIS — Z9889 Other specified postprocedural states: Secondary | ICD-10-CM

## 2020-03-03 DIAGNOSIS — M7542 Impingement syndrome of left shoulder: Secondary | ICD-10-CM | POA: Diagnosis not present

## 2020-03-03 DIAGNOSIS — M25511 Pain in right shoulder: Secondary | ICD-10-CM | POA: Diagnosis not present

## 2020-03-03 DIAGNOSIS — M19019 Primary osteoarthritis, unspecified shoulder: Secondary | ICD-10-CM | POA: Diagnosis not present

## 2020-03-03 MED FILL — CELECOXIB 200 MG CAP: 200 | 30 days supply | Qty: 30 | Fill #0

## 2020-03-17 ENCOUNTER — Ambulatory Visit
Admission: RE | Admit: 2020-03-17 | Discharge: 2020-03-17 | Disposition: A | Discharge status: Home / Self Care | Payer: Medicare Other | Source: Ambulatory Visit | Attending: Hematology and Oncology | Admitting: Hematology and Oncology

## 2020-03-17 ENCOUNTER — Other Ambulatory Visit: Payer: Self-pay

## 2020-03-17 DIAGNOSIS — R922 Inconclusive mammogram: Secondary | ICD-10-CM | POA: Diagnosis not present

## 2020-03-17 DIAGNOSIS — Z853 Personal history of malignant neoplasm of breast: Secondary | ICD-10-CM | POA: Diagnosis not present

## 2020-03-17 DIAGNOSIS — Z9889 Other specified postprocedural states: Secondary | ICD-10-CM

## 2020-03-23 DIAGNOSIS — Z1159 Encounter for screening for other viral diseases: Secondary | ICD-10-CM | POA: Diagnosis not present

## 2020-03-23 DIAGNOSIS — E7800 Pure hypercholesterolemia, unspecified: Secondary | ICD-10-CM | POA: Diagnosis not present

## 2020-03-23 DIAGNOSIS — Z0000 Encounter for general adult medical examination without abnormal findings: Secondary | ICD-10-CM | POA: Diagnosis not present

## 2020-03-23 DIAGNOSIS — E559 Vitamin D deficiency, unspecified: Secondary | ICD-10-CM | POA: Diagnosis not present

## 2020-03-23 DIAGNOSIS — I10 Essential (primary) hypertension: Secondary | ICD-10-CM | POA: Diagnosis not present

## 2020-03-23 LAB — LIPID PANEL
Cholesterol: 215 — AB (ref 0–200)
HDL: 137 — AB (ref 35–70)
LDL Cholesterol: 121
LDl/HDL Ratio: 2.7
Triglycerides: 71 (ref 40–160)

## 2020-03-23 LAB — TSH: TSH: 0.44 (ref 0.41–5.90)

## 2020-03-23 LAB — VITAMIN D 25 HYDROXY (VIT D DEFICIENCY, FRACTURES): Vit D, 25-Hydroxy: 40.7

## 2020-04-04 MED FILL — CELECOXIB 200 MG CAP: 200 | 30 days supply | Qty: 30 | Fill #1

## 2020-04-05 ENCOUNTER — Ambulatory Visit (INDEPENDENT_AMBULATORY_CARE_PROVIDER_SITE_OTHER): Payer: Medicare Other | Admitting: Bariatrics

## 2020-05-03 ENCOUNTER — Encounter (INDEPENDENT_AMBULATORY_CARE_PROVIDER_SITE_OTHER): Payer: Self-pay | Admitting: Bariatrics

## 2020-05-03 ENCOUNTER — Other Ambulatory Visit: Payer: Self-pay

## 2020-05-03 ENCOUNTER — Other Ambulatory Visit (HOSPITAL_BASED_OUTPATIENT_CLINIC_OR_DEPARTMENT_OTHER): Payer: Self-pay | Admitting: Bariatrics

## 2020-05-03 ENCOUNTER — Ambulatory Visit (INDEPENDENT_AMBULATORY_CARE_PROVIDER_SITE_OTHER): Payer: Medicare Other | Admitting: Bariatrics

## 2020-05-03 VITALS — BP 136/75 | HR 62 | Temp 98.0°F | Ht 61.0 in | Wt 197.0 lb

## 2020-05-03 DIAGNOSIS — Z6837 Body mass index (BMI) 37.0-37.9, adult: Secondary | ICD-10-CM

## 2020-05-03 DIAGNOSIS — E559 Vitamin D deficiency, unspecified: Secondary | ICD-10-CM | POA: Diagnosis not present

## 2020-05-03 DIAGNOSIS — D508 Other iron deficiency anemias: Secondary | ICD-10-CM

## 2020-05-03 MED ORDER — VITAMIN D3 1.25 MG (50000 UT) PO CAPS: 1.0000 | ORAL_CAPSULE | ORAL | 0 refills | Status: DC

## 2020-05-03 MED ORDER — FERROUS GLUCONATE 324 (38 FE) MG PO TABS: 324.0000 mg | ORAL_TABLET | Freq: Every day | ORAL | 2 refills | Status: DC

## 2020-05-03 MED FILL — FERROUS GLUCONATE 324 MG TA: 324 (38 FE) | 100 days supply | Qty: 100 | Fill #0

## 2020-05-03 NOTE — Progress Notes (Signed)
Chief Complaint:   OBESITY [name] is here to discuss her progress with her obesity treatment plan along with follow-up of her obesity related diagnoses. [name] is keeping a food journal and adhering to recommended goals of 1200 calories and 80-90 grams of protein and states she is following her eating plan approximately 0% of the time. [name] states she is exercising 0 minutes 0 times per week.  Today's visit was #: 22 Starting weight: 204 lbs Starting date: 02/18/2018 Today's weight: 197 lbs Today's date: 05/03/2020 Total lbs lost to date: 7 Total lbs lost since last in-office visit: 1  Interim History: [name] is down 1 lb. She has eaten "more junk." She has had dental work and is not able to eat solid food.  Subjective:   Vitamin D deficiency. Page is taking Vitamin D3 supplementation.   Ref. Range 11/26/2019 13:59  Vitamin D, 25-Hydroxy Latest Ref Range: 30.0 - 100.0 ng/mL 40.5   Other iron deficiency anemia. [name] reports feeling cold at home. She endorses gastric bypass.  CBC Latest Ref Rng & Units 06/09/2019 11/26/2017 08/12/2017  WBC 3.4 - 10.8 x10E3/uL 4.5 4.3 3.1(L)  Hemoglobin 11.1 - 15.9 g/dL 12.2 11.5(L) 10.5(L)  Hematocrit 34.0 - 46.6 % 36.8 34.8 32.0(L)  Platelets 150 - 450 x10E3/uL 234 192 175   No results found for: IRON, TIBC, FERRITIN Lab Results  Component Value Date   VITAMINB12 387 11/26/2019   Assessment/Plan:   Vitamin D deficiency. Low Vitamin D level contributes to fatigue and are associated with obesity, breast, and colon cancer. She was given a prescription for Cholecalciferol (VITAMIN D3) 1.25 MG (50000 UT) CAPS every 2 weeks #5 with 0 refills and will follow-up for routine testing of Vitamin D, at least 2-3 times per year to avoid over-replacement.   Other iron deficiency anemia. Orders and follow up as documented in patient record. Prescription was given for Ferrous gluconate 325 mg 1 daily #30 with 2  refills.  Counseling . Iron is essential for our bodies to make red blood cells.  Reasons that someone may be deficient include: an iron-deficient diet (more likely in those following vegan or vegetarian diets), women with heavy menses, patients with GI disorders or poor absorption, patients that have had bariatric surgery, frequent blood donors, patients with cancer, and patients with heart disease.   Marland Kitchen An iron supplement has been recommended. This is found over-the-counter.  Marden Noble foods include dark leafy greens, red and white meats, eggs, seafood, and beans.   . Certain foods and drinks prevent your body from absorbing iron properly. Avoid eating these foods in the same meal as iron-rich foods or with iron supplements. These foods include: coffee, black tea, and red wine; milk, dairy products, and foods that are high in calcium; beans and soybeans; whole grains.  . Constipation can be a side effect of iron supplementation. Increased water and fiber intake are helpful. Water goal: > 2 liters/day. Fiber goal: > 25 grams/day.  Class 2 severe obesity due to excess calories with serious comorbidity and body mass index (BMI) of 37.0 to 37.9 in adult [facility]).  [name] is currently in the action stage of change. As such, her goal is to continue with weight loss efforts. She has agreed to the Category 2 Plan.   She will work on meal planning, decreasing carbohydrates, and increasing her protein intake.  Exercise goals: All adults should avoid inactivity. Some physical activity is better than none, and adults who participate in any  amount of physical activity gain some health benefits.  Behavioral modification strategies: increasing lean protein intake, decreasing simple carbohydrates, increasing vegetables, increasing water intake, decreasing eating out, no skipping meals, meal planning and cooking strategies, keeping healthy foods in the home and ways to avoid boredom eating.  [name] has agreed  to follow-up with our clinic in 3 months. She was informed of the importance of frequent follow-up visits to maximize her success with intensive lifestyle modifications for her multiple health conditions.   Objective:   Blood pressure 136/75, pulse 62, temperature 98 F (36.7 C), height 5\' 1"  (1.549 m), weight 197 lb (89.4 kg), SpO2 97 %. Body mass index is 37.22 kg/m.  General: Cooperative, alert, well developed, in no acute distress. HEENT: Conjunctivae and lids unremarkable. Cardiovascular: Regular rhythm.  Lungs: Normal work of breathing. Neurologic: No focal deficits.   Lab Results  Component Value Date   CREATININE 0.69 11/26/2019   BUN 15 11/26/2019   NA 145 (H) 11/26/2019   K 4.8 11/26/2019   CL 105 11/26/2019   CO2 26 11/26/2019   Lab Results  Component Value Date   ALT 11 11/26/2019   AST 14 11/26/2019   ALKPHOS 83 11/26/2019   BILITOT 0.3 11/26/2019   Lab Results  Component Value Date   HGBA1C 5.7 (H) 11/26/2019   HGBA1C 5.5 06/09/2019   HGBA1C 5.6 12/17/2018   HGBA1C 5.7 (H) 06/10/2018   HGBA1C 5.8 (H) 02/18/2018   Lab Results  Component Value Date   INSULIN 3.8 11/26/2019   INSULIN 7.0 06/09/2019   INSULIN 4.9 02/18/2018   Lab Results  Component Value Date   TSH 0.802 02/18/2018   Lab Results  Component Value Date   CHOL 218 (H) 11/26/2019   HDL 82 11/26/2019   LDLCALC 123 (H) 11/26/2019   TRIG 72 11/26/2019   CHOLHDL 2.5 11/07/2018   Lab Results  Component Value Date   WBC 4.5 06/09/2019   HGB 12.2 06/09/2019   HCT 36.8 06/09/2019   MCV 95 06/09/2019   PLT 234 06/09/2019   No results found for: IRON, TIBC, FERRITIN  Obesity Behavioral Intervention:   Approximately 15 minutes were spent on the discussion below.  ASK: We discussed the diagnosis of obesity with [name] today and [name] agreed to give [name] permission to discuss obesity behavioral modification therapy today.  ASSESS: [name] has the diagnosis of obesity and her BMI  today is 37.3. [name] is in the action stage of change.   ADVISE: [name] was educated on the multiple health risks of obesity as well as the benefit of weight loss to improve her health. She was advised of the need for long term treatment and the importance of lifestyle modifications to improve her current health and to decrease her risk of future health problems.  AGREE: Multiple dietary modification options and treatment options were discussed and Abel agreed to follow the recommendations documented in the above note.  ARRANGE: Tajia was educated on the importance of frequent visits to treat obesity as outlined per CMS and USPSTF guidelines and agreed to schedule her next follow up appointment today.  Attestation Statements:   Reviewed by clinician on day of visit: allergies, medications, problem list, medical history, surgical history, family history, social history, and previous encounter notes.  Migdalia Dk, am acting as Location manager for [employer], DO   I have reviewed the above documentation for accuracy and completeness, and I agree with the above. Jearld Lesch, DO

## 2020-05-04 ENCOUNTER — Other Ambulatory Visit (INDEPENDENT_AMBULATORY_CARE_PROVIDER_SITE_OTHER): Payer: Self-pay | Admitting: Bariatrics

## 2020-05-10 MED FILL — CELECOXIB 200 MG CAP: 200 | 30 days supply | Qty: 30 | Fill #2

## 2020-05-10 MED FILL — VIT D3-50 50,000 UNITS CAPS: 1.25 MG | 70 days supply | Qty: 5 | Fill #0

## 2020-05-16 ENCOUNTER — Ambulatory Visit: Payer: Medicare Other | Attending: Internal Medicine

## 2020-05-16 ENCOUNTER — Other Ambulatory Visit (HOSPITAL_BASED_OUTPATIENT_CLINIC_OR_DEPARTMENT_OTHER): Payer: Self-pay | Admitting: Internal Medicine

## 2020-05-16 DIAGNOSIS — Z23 Encounter for immunization: Secondary | ICD-10-CM

## 2020-05-16 MED FILL — PFIZER-BIONTECH COVID-19 VA: 30 | 21 days supply | Qty: 0 | Fill #0

## 2020-05-16 NOTE — Progress Notes (Signed)
   Covid-19 Vaccination Clinic  Name:  [name]    MRN: [id number] DOB: 1960/12/23  05/16/2020  Ms. [name] was observed post Covid-19 immunization for 15 minutes without incident. She was provided with Vaccine Information Sheet and instruction to access the V-Safe system.   Ms. [name] was instructed to call 911 with any severe reactions post vaccine: Marland Kitchen Difficulty breathing  . Swelling of face and throat  . A fast heartbeat  . A bad rash all over body  . Dizziness and weakness   Immunizations Administered    Name Date Dose VIS Date Route   Moderna COVID-19 Vaccine 05/16/2020  9:01 AM 0.5 mL 03/09/2020 Intramuscular   Manufacturer: Gala Murdoch   Lot: [id number]   NDC: 65993-570-17

## 2020-06-11 ENCOUNTER — Other Ambulatory Visit (HOSPITAL_BASED_OUTPATIENT_CLINIC_OR_DEPARTMENT_OTHER): Payer: Self-pay | Admitting: Orthopedic Surgery

## 2020-06-13 MED FILL — CELECOXIB 200 MG CAP: 200 | 30 days supply | Qty: 30 | Fill #0

## 2020-06-22 ENCOUNTER — Other Ambulatory Visit (HOSPITAL_BASED_OUTPATIENT_CLINIC_OR_DEPARTMENT_OTHER): Payer: Self-pay | Admitting: Family Medicine

## 2020-06-22 DIAGNOSIS — J0100 Acute maxillary sinusitis, unspecified: Secondary | ICD-10-CM | POA: Diagnosis not present

## 2020-06-22 MED FILL — AZITHROMYCIN 250 MG TABLET: 250 | 5 days supply | Qty: 6 | Fill #0

## 2020-06-30 ENCOUNTER — Other Ambulatory Visit (HOSPITAL_BASED_OUTPATIENT_CLINIC_OR_DEPARTMENT_OTHER): Payer: Self-pay | Admitting: Family Medicine

## 2020-07-01 MED FILL — AMOXICILLIN 875 MG TABS: 875 | 10 days supply | Qty: 20 | Fill #0

## 2020-07-13 ENCOUNTER — Other Ambulatory Visit (HOSPITAL_BASED_OUTPATIENT_CLINIC_OR_DEPARTMENT_OTHER): Payer: Self-pay | Admitting: Physical Medicine and Rehabilitation

## 2020-07-13 DIAGNOSIS — M5412 Radiculopathy, cervical region: Secondary | ICD-10-CM | POA: Diagnosis not present

## 2020-07-13 DIAGNOSIS — M5030 Other cervical disc degeneration, unspecified cervical region: Secondary | ICD-10-CM | POA: Diagnosis not present

## 2020-07-13 DIAGNOSIS — M961 Postlaminectomy syndrome, not elsewhere classified: Secondary | ICD-10-CM | POA: Diagnosis not present

## 2020-07-13 MED FILL — SOLIFENACIN SUCCINATE 10 MG: 10 | 90 days supply | Qty: 90 | Fill #2

## 2020-07-13 MED FILL — HYDROCODON-APAP 5-325: 5-325 | 5 days supply | Qty: 20 | Fill #0

## 2020-07-14 NOTE — Assessment & Plan Note (Signed)
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 Adjuvant chemotherapy with Adriamycin/Cytoxan x 4, then weekly Taxol x 12completed 04/19/17 2. Adjuvant radiation therapycompleted on 07/26/2017 3. Adjuvant antiestrogen therapystarts 08/28/17 -------------------------------------------------------------------------------------------------------------------------------- Plan: Adj Anti estrogen therapy with Letrozole 2.5 mg daily Letrozole toxicities: Denies any hot flashes.  She does have severe muscle stiffness which lasts all day and does not get better with activity  Chemo-induced peripheral neuropathy: She stopped gabapentin but continues to have neuropathy in her feet and now in her hands as well.  Breast cancer surveillance: 1.  Breast exam 07/15/2020: Benign 2.  Mammogram: 03/17/2020: Benign breast density category D (MRI recommended because of high density and family history) 3. Breast MRI 08/11/19: Benign Density Cat C.  Return to clinic in 1 year for follow-up

## 2020-07-14 NOTE — Progress Notes (Signed)
Patient Care Team: [name], MD as PCP - General (Family Medicine) Croitoru, Dani Gobble, MD as PCP - Cardiology (Cardiology) Nicholas Lose, MD as Consulting Physician (Hematology and Oncology) Kyung Rudd, MD as Consulting Physician (Radiation Oncology) Erroll Luna, MD as Consulting Physician (General Surgery) Gardenia Phlegm, NP as Nurse Practitioner (Hematology and Oncology)  DIAGNOSIS:    ICD-10-CM   1. Malignant neoplasm of lower-outer quadrant of left breast of female, estrogen receptor positive ([place])  C50.512    Z17.0     SUMMARY OF ONCOLOGIC HISTORY: Oncology History  Malignant neoplasm of lower-outer quadrant of left breast of female, estrogen receptor positive ([place])  06/26/2016 Initial Diagnosis   Left breast biopsy 3:00: IDC with DCIS, grade 3, ER 80%, PR 20%, Ki-67 60%, HER-2 negative ratio 1.18; palpable lump: 1.8 cm lesion, no lymph nodes, T1 cN0 stage I a clinical stage   07/26/2016 Surgery   Left lumpectomy: IDC 1.8 cm, with DCIS, margins negative, 0/2 lymph nodes, ER 80%, PR 20%, HER-2 negative ratio 1.18, Ki-67 60%, T1cN0 stage IA    08/21/2016 Oncotype testing   Oncotype DX recurrence score 51, risk of recurrence 34%   08/21/2016 Genetic Testing   Genetic counseling and testing for hereditary cancer syndromes performed on 08/21/2016. Results are negative for pathogenic mutations in 46 genes analyzed by [facility] Common Hereditary Cancers Panel. Results are dated 09/14/2016. Genes tested: APC, ATM, AXIN2, BARD1, BMPR1A, BRCA1, BRCA2, BRIP1, CDH1, CDKN2A, CHEK2, CTNNA1, DICER1, EPCAM, GREM1, HOXB13, KIT, MEN1, MLH1, MSH2, MSH3, MSH6, MUTYH, NBN, NF1, NTHL1, PALB2, PDGFRA, PMS2, POLD1, POLE, PTEN, RAD50, RAD51C, RAD51D, SDHA, SDHB, SDHC, SDHD, SMAD4, SMARCA4, STK11, TP53, TSC1, TSC2, and VHL.     11/02/2016 - 04/19/2017 Chemotherapy   Dose dense Adriamycin and Cytoxan 4 followed by Taxol weekly 12   06/10/2017 - 07/26/2017 Radiation Therapy   Adjuvant  radiation with Dr. Lisbeth Renshaw   08/2017 -  Anti-estrogen oral therapy   Letrozole daily     CHIEF COMPLIANT: Follow-up of left breast cancer on letrozole  INTERVAL HISTORY: [name] is a 60 y.o. with above-mentioned history of left breast cancer treated with lumpectomy, adjuvant chemotherapy, radiation, and who is currently on antiestrogen therapy with letrozole. Mammogram on 03/17/20 showed no evidence of malignancy bilaterally. She presents to the clinic today for follow-up.   She complains of severe joint symptoms and she is requiring injections to be given into her joints.  She is also significantly fatigued.  She continues to have peripheral neuropathy.  ALLERGIES:  is allergic to aspirin, oxycodone hcl, propoxyphene n-acetaminophen, tramadol, adhesive [tape], and prednisone.  MEDICATIONS:  Current Outpatient Medications  Medication Sig Dispense Refill  . albuterol (VENTOLIN HFA) 108 (90 Base) MCG/ACT inhaler Inhale 2 puffs into the lungs every 6 (six) hours as needed for wheezing or shortness of breath.    Marland Kitchen aspirin EC 81 MG tablet Take 81 mg by mouth daily.    . Calcium Citrate-Vitamin D (CALCIUM CITRATE +D PO) Take 2 tablets by mouth daily. Calcium 600 mg    . Cholecalciferol (VITAMIN D3) 1.25 MG (50000 UT) CAPS Take 1 capsule by mouth every 14 (fourteen) days. 5 capsule 0  . ferrous gluconate (FERGON) 324 MG tablet Take 1 tablet (324 mg total) by mouth daily with breakfast. 30 tablet 2  . hydrOXYzine (ATARAX/VISTARIL) 25 MG tablet Take 25 mg by mouth 3 (three) times daily as needed.    Marland Kitchen letrozole (FEMARA) 2.5 MG tablet Take 1 tablet (2.5 mg total) by mouth daily. [place]  tablet 3  . Methocarbamol (ROBAXIN-750 PO) Robaxin    . mirabegron ER (MYRBETRIQ) 25 MG TB24 tablet Take 25 mg by mouth daily.    . mometasone (ELOCON) 0.1 % cream Apply 1 application topically daily as needed (rash - summer eczema). Apply to arms    . Multiple Vitamin (MULTIVITAMIN WITH MINERALS) TABS tablet  Take 1 tablet by mouth at bedtime.    . silver sulfADIAZINE (SILVADENE) 1 % cream Apply 1 application topically 2 (two) times daily as needed (stomach tears). 50 g 0  . solifenacin (VESICARE) 10 MG tablet     . valACYclovir (VALTREX) 1000 MG tablet Take 1 tablet (1,000 mg total) by mouth 3 (three) times daily. 21 tablet 0   No current facility-administered medications for this visit.    PHYSICAL EXAMINATION: ECOG PERFORMANCE STATUS: 1 - Symptomatic but completely ambulatory  Vitals:   07/15/20 0918  BP: 132/65  Pulse: 67  Resp: 17  Temp: 97.7 F (36.5 C)  SpO2: 100%   Filed Weights   07/15/20 0918  Weight: 209 lb 3.2 oz (94.9 kg)    BREAST: No palpable masses or nodules in either right or left breasts. No palpable axillary supraclavicular or infraclavicular adenopathy no breast tenderness or nipple discharge. (exam performed in the presence of a chaperone)  LABORATORY DATA:  I have reviewed the data as listed CMP Latest Ref Rng & Units 11/26/2019 06/09/2019 12/17/2018  Glucose 65 - 99 mg/dL 92 83 65  BUN 6 - 24 mg/dL 15 16 14   Creatinine 0.57 - 1.00 mg/dL 0.69 0.84 0.68  Sodium 134 - 144 mmol/L 145(H) 142 140  Potassium 3.5 - 5.2 mmol/L 4.8 5.0 4.0  Chloride 96 - 106 mmol/L 105 102 102  CO2 20 - 29 mmol/L 26 25 20   Calcium 8.7 - 10.2 mg/dL 9.7 10.0 9.1  Total Protein 6.0 - 8.5 g/dL 7.0 7.6 7.5  Total Bilirubin 0.0 - 1.2 mg/dL 0.3 0.4 0.5  Alkaline Phos 48 - 121 IU/L 83 99 102  AST 0 - 40 IU/L 14 17 16   ALT 0 - 32 IU/L 11 8 14     Lab Results  Component Value Date   WBC 4.5 06/09/2019   HGB 12.2 06/09/2019   HCT 36.8 06/09/2019   MCV 95 06/09/2019   PLT 234 06/09/2019   NEUTROABS 2.4 06/09/2019    ASSESSMENT & PLAN:  Malignant neoplasm of lower-outer quadrant of left breast of female, estrogen receptor positive ([place]) 07/26/2016: Left lumpectomy: IDC 1.8 cm, with DCIS, margins negative, 0/2 lymph nodes, ER 80%, PR 20%, HER-2 negative ratio 1.18, Ki-67 60%, T1cN0  stage IA  Oncotype DX score 51: 10 year risk of recurrence greater than 34%, extremely high risk  Treatment Summary: 1. Adjuvant chemotherapy with Adriamycin/Cytoxan x 4, then weekly Taxol x 12completed 04/19/17 2. Adjuvant radiation therapycompleted on 07/26/2017 3. Adjuvant antiestrogen therapystarts 08/28/17 -------------------------------------------------------------------------------------------------------------------------------- Plan: Adj Anti estrogen therapy with Letrozole 2.5 mg daily Letrozole toxicities: Denies any hot flashes.  She does have severe muscle stiffness which lasts all day and does not get better with activity  Chemo-induced peripheral neuropathy: She stopped gabapentin but continues to have neuropathy in her feet and now in her hands as well.  Breast cancer surveillance: 1.  Breast exam 07/15/2020: Benign 2.  Mammogram: 03/17/2020: Benign breast density category D (MRI recommended because of high density and family history) 3. Breast MRI 08/11/19: Benign Density Cat C.  We will order a bone density test in the next couple weeks. Severe  joint symptoms: I discussed with her about stopping letrozole for 3 weeks.  We will assess if her symptoms are any better.  We can always try to switch her to anastrozole.  Breast MRI needs to be arranged for end of March. Return to clinic for telephone visit in 3 weeks to discuss her joint symptoms after stopping letrozole.    No orders of the defined types were placed in this encounter.  The patient has a good understanding of the overall plan. she agrees with it. she will call with any problems that may develop before the next visit here.  Total time spent: 20 mins including face to face time and time spent for planning, charting and coordination of care  Rulon Eisenmenger, MD, MPH 07/15/2020  I, Molly Dorshimer, am acting as scribe for Dr. Nicholas Lose.  I have reviewed the above documentation for accuracy and  completeness, and I agree with the above.

## 2020-07-15 ENCOUNTER — Inpatient Hospital Stay: Payer: Medicare Other | Attending: Hematology and Oncology | Admitting: Hematology and Oncology

## 2020-07-15 ENCOUNTER — Telehealth: Payer: Self-pay | Admitting: Hematology and Oncology

## 2020-07-15 ENCOUNTER — Other Ambulatory Visit: Payer: Self-pay

## 2020-07-15 DIAGNOSIS — C50512 Malignant neoplasm of lower-outer quadrant of left female breast: Secondary | ICD-10-CM | POA: Diagnosis not present

## 2020-07-15 DIAGNOSIS — Z7982 Long term (current) use of aspirin: Secondary | ICD-10-CM | POA: Diagnosis not present

## 2020-07-15 DIAGNOSIS — Z9221 Personal history of antineoplastic chemotherapy: Secondary | ICD-10-CM | POA: Diagnosis not present

## 2020-07-15 DIAGNOSIS — G620 Drug-induced polyneuropathy: Secondary | ICD-10-CM | POA: Diagnosis not present

## 2020-07-15 DIAGNOSIS — Z7951 Long term (current) use of inhaled steroids: Secondary | ICD-10-CM | POA: Diagnosis not present

## 2020-07-15 DIAGNOSIS — Z170 Estrogen receptor positive status [ER+]: Secondary | ICD-10-CM | POA: Diagnosis not present

## 2020-07-15 DIAGNOSIS — Z923 Personal history of irradiation: Secondary | ICD-10-CM | POA: Diagnosis not present

## 2020-07-15 DIAGNOSIS — T451X5A Adverse effect of antineoplastic and immunosuppressive drugs, initial encounter: Secondary | ICD-10-CM | POA: Diagnosis not present

## 2020-07-15 DIAGNOSIS — Z79811 Long term (current) use of aromatase inhibitors: Secondary | ICD-10-CM | POA: Diagnosis not present

## 2020-07-15 DIAGNOSIS — Z79899 Other long term (current) drug therapy: Secondary | ICD-10-CM | POA: Insufficient documentation

## 2020-07-15 NOTE — Telephone Encounter (Signed)
Scheduled appts per 2/25 los. Gave pt a appt reminder card.

## 2020-07-22 ENCOUNTER — Other Ambulatory Visit (HOSPITAL_BASED_OUTPATIENT_CLINIC_OR_DEPARTMENT_OTHER): Payer: Self-pay | Admitting: Physical Medicine and Rehabilitation

## 2020-07-22 MED FILL — HYDROCODON-APAP 5-325: 5-325 | 5 days supply | Qty: 20 | Fill #0

## 2020-07-25 DIAGNOSIS — M25512 Pain in left shoulder: Secondary | ICD-10-CM | POA: Diagnosis not present

## 2020-07-27 DIAGNOSIS — M961 Postlaminectomy syndrome, not elsewhere classified: Secondary | ICD-10-CM | POA: Diagnosis not present

## 2020-07-28 ENCOUNTER — Emergency Department (HOSPITAL_BASED_OUTPATIENT_CLINIC_OR_DEPARTMENT_OTHER): Payer: Medicare Other

## 2020-07-28 ENCOUNTER — Other Ambulatory Visit: Payer: Self-pay

## 2020-07-28 ENCOUNTER — Observation Stay (HOSPITAL_BASED_OUTPATIENT_CLINIC_OR_DEPARTMENT_OTHER)
Admission: EM | Admit: 2020-07-28 | Discharge: 2020-07-30 | Disposition: A | Discharge status: Home / Self Care | Payer: Medicare Other | Attending: Internal Medicine | Admitting: Internal Medicine

## 2020-07-28 ENCOUNTER — Encounter (HOSPITAL_BASED_OUTPATIENT_CLINIC_OR_DEPARTMENT_OTHER): Payer: Self-pay

## 2020-07-28 DIAGNOSIS — I10 Essential (primary) hypertension: Secondary | ICD-10-CM | POA: Diagnosis not present

## 2020-07-28 DIAGNOSIS — J45909 Unspecified asthma, uncomplicated: Secondary | ICD-10-CM | POA: Diagnosis present

## 2020-07-28 DIAGNOSIS — R7303 Prediabetes: Secondary | ICD-10-CM | POA: Insufficient documentation

## 2020-07-28 DIAGNOSIS — Z853 Personal history of malignant neoplasm of breast: Secondary | ICD-10-CM | POA: Diagnosis not present

## 2020-07-28 DIAGNOSIS — Z7982 Long term (current) use of aspirin: Secondary | ICD-10-CM | POA: Diagnosis not present

## 2020-07-28 DIAGNOSIS — Z86711 Personal history of pulmonary embolism: Secondary | ICD-10-CM

## 2020-07-28 DIAGNOSIS — Z79899 Other long term (current) drug therapy: Secondary | ICD-10-CM | POA: Diagnosis not present

## 2020-07-28 DIAGNOSIS — K922 Gastrointestinal hemorrhage, unspecified: Secondary | ICD-10-CM | POA: Insufficient documentation

## 2020-07-28 DIAGNOSIS — R1084 Generalized abdominal pain: Secondary | ICD-10-CM | POA: Diagnosis not present

## 2020-07-28 DIAGNOSIS — Z87891 Personal history of nicotine dependence: Secondary | ICD-10-CM | POA: Diagnosis not present

## 2020-07-28 DIAGNOSIS — Q211 Atrial septal defect: Secondary | ICD-10-CM

## 2020-07-28 DIAGNOSIS — Z8673 Personal history of transient ischemic attack (TIA), and cerebral infarction without residual deficits: Secondary | ICD-10-CM | POA: Diagnosis not present

## 2020-07-28 DIAGNOSIS — Z9861 Coronary angioplasty status: Secondary | ICD-10-CM | POA: Diagnosis not present

## 2020-07-28 DIAGNOSIS — K529 Noninfective gastroenteritis and colitis, unspecified: Secondary | ICD-10-CM | POA: Diagnosis not present

## 2020-07-28 DIAGNOSIS — R1030 Lower abdominal pain, unspecified: Secondary | ICD-10-CM | POA: Diagnosis present

## 2020-07-28 DIAGNOSIS — R109 Unspecified abdominal pain: Secondary | ICD-10-CM | POA: Diagnosis present

## 2020-07-28 DIAGNOSIS — Q2112 Patent foramen ovale: Secondary | ICD-10-CM

## 2020-07-28 DIAGNOSIS — A082 Adenoviral enteritis: Secondary | ICD-10-CM | POA: Diagnosis not present

## 2020-07-28 DIAGNOSIS — Z20822 Contact with and (suspected) exposure to covid-19: Secondary | ICD-10-CM | POA: Diagnosis not present

## 2020-07-28 LAB — CBC WITH DIFFERENTIAL/PLATELET
Abs Immature Granulocytes: 0.03 10*3/uL (ref 0.00–0.07)
Basophils Absolute: 0 10*3/uL (ref 0.0–0.1)
Basophils Relative: 0 %
Eosinophils Absolute: 0 10*3/uL (ref 0.0–0.5)
Eosinophils Relative: 0 %
HCT: 38 % (ref 36.0–46.0)
Hemoglobin: 12.6 g/dL (ref 12.0–15.0)
Immature Granulocytes: 0 %
Lymphocytes Relative: 13 %
Lymphs Abs: 1.3 10*3/uL (ref 0.7–4.0)
MCH: 31.5 pg (ref 26.0–34.0)
MCHC: 33.2 g/dL (ref 30.0–36.0)
MCV: 95 fL (ref 80.0–100.0)
Monocytes Absolute: 0.8 10*3/uL (ref 0.1–1.0)
Monocytes Relative: 8 %
Neutro Abs: 8.1 10*3/uL — ABNORMAL HIGH (ref 1.7–7.7)
Neutrophils Relative %: 79 %
Platelets: 203 10*3/uL (ref 150–400)
RBC: 4 MIL/uL (ref 3.87–5.11)
RDW: 14.8 % (ref 11.5–15.5)
WBC: 10.3 10*3/uL (ref 4.0–10.5)
nRBC: 0 % (ref 0.0–0.2)

## 2020-07-28 LAB — HIV ANTIBODY (ROUTINE TESTING W REFLEX): HIV Screen 4th Generation wRfx: NONREACTIVE

## 2020-07-28 LAB — COMPREHENSIVE METABOLIC PANEL
ALT: 15 U/L (ref 0–44)
AST: 17 U/L (ref 15–41)
Albumin: 4.3 g/dL (ref 3.5–5.0)
Alkaline Phosphatase: 63 U/L (ref 38–126)
Anion gap: 9 (ref 5–15)
BUN: 21 mg/dL — ABNORMAL HIGH (ref 6–20)
CO2: 26 mmol/L (ref 22–32)
Calcium: 9.1 mg/dL (ref 8.9–10.3)
Chloride: 102 mmol/L (ref 98–111)
Creatinine, Ser: 0.86 mg/dL (ref 0.44–1.00)
GFR, Estimated: >60 mL/min (ref >60)
Glucose, Bld: 109 mg/dL — ABNORMAL HIGH (ref 70–99)
Potassium: 3.5 mmol/L (ref 3.5–5.1)
Sodium: 137 mmol/L (ref 135–145)
Total Bilirubin: 0.6 mg/dL (ref 0.3–1.2)
Total Protein: 7.2 g/dL (ref 6.5–8.1)

## 2020-07-28 LAB — RESP PANEL BY RT-PCR (FLU A&B, COVID) ARPGX2
Influenza A by PCR: NEGATIVE
Influenza B by PCR: NEGATIVE
SARS Coronavirus 2 by RT PCR: NEGATIVE

## 2020-07-28 LAB — HEMOGLOBIN AND HEMATOCRIT, BLOOD
HCT: 36.4 % (ref 36.0–46.0)
Hemoglobin: 12.1 g/dL (ref 12.0–15.0)

## 2020-07-28 LAB — LIPASE, BLOOD: Lipase: 29 U/L (ref 11–51)

## 2020-07-28 MED ORDER — METRONIDAZOLE IN NACL 5-0.79 MG/ML-% IV SOLN
500.0000 mg | Freq: Three times a day (TID) | INTRAVENOUS | Status: DC
  Administered 2020-07-28 – 2020-07-30 (×5): 500 mg via INTRAVENOUS
  Filled 2020-07-28 (×5): qty 100

## 2020-07-28 MED ORDER — FENTANYL CITRATE (PF) 100 MCG/2ML IJ SOLN
50.0000 ug | Freq: Once | INTRAMUSCULAR | Status: AC
  Administered 2020-07-28: 50 ug via INTRAVENOUS
  Filled 2020-07-28: qty 2

## 2020-07-28 MED ORDER — ONDANSETRON HCL 4 MG/2ML IJ SOLN
4.0000 mg | Freq: Once | INTRAMUSCULAR | Status: AC
  Administered 2020-07-28: 4 mg via INTRAVENOUS
  Filled 2020-07-28: qty 2

## 2020-07-28 MED ORDER — ONDANSETRON HCL 4 MG PO TABS: 4.0000 mg | ORAL_TABLET | Freq: Four times a day (QID) | ORAL | Status: DC | PRN

## 2020-07-28 MED ORDER — IOHEXOL 300 MG/ML  SOLN: 100.0000 mL | Freq: Once | INTRAMUSCULAR | Status: DC | PRN

## 2020-07-28 MED ORDER — PANTOPRAZOLE SODIUM 40 MG IV SOLR
40.0000 mg | Freq: Two times a day (BID) | INTRAVENOUS | Status: DC
  Administered 2020-07-28 – 2020-07-30 (×4): 40 mg via INTRAVENOUS
  Filled 2020-07-28 (×4): qty 40

## 2020-07-28 MED ORDER — CIPROFLOXACIN IN D5W 400 MG/200ML IV SOLN
400.0000 mg | Freq: Two times a day (BID) | INTRAVENOUS | Status: DC
  Administered 2020-07-29 – 2020-07-30 (×4): 400 mg via INTRAVENOUS
  Filled 2020-07-28 (×5): qty 200

## 2020-07-28 MED ORDER — ONDANSETRON HCL 4 MG/2ML IJ SOLN
4.0000 mg | Freq: Four times a day (QID) | INTRAMUSCULAR | Status: DC | PRN
  Administered 2020-07-29: 4 mg via INTRAVENOUS
  Filled 2020-07-28 (×2): qty 2

## 2020-07-28 NOTE — ED Notes (Addendum)
Patient transported to CT 

## 2020-07-28 NOTE — ED Notes (Signed)
Assisted patient to bathroom.

## 2020-07-28 NOTE — ED Provider Notes (Signed)
[facility] EMERGENCY DEPARTMENT Provider Note   CSN: [id number] Arrival date & time: 07/28/20  0725     History Chief Complaint  Patient presents with  . Abdominal Pain  . Diarrhea    [name] is a 60 y.o. female.  HPI Patient presents with abdominal pain.  Mid to lower abdomen.  Began around 9:00 last night.  States she ate some food at 8:00.  States was think with her family.  After that developed multiple episodes of diarrhea.  Vomited once but has some nausea.  Had chills and states she was sweating.  Continued pain.  No fevers.  No known sick contacts.  No blood in stool or emesis.  Has had previous gallbladder removal, gastric bypass, hysterectomy.  Previous breast cancer and states left arm cannot be used for IVs and they typically have difficulty getting IV on the right side.    Past Medical History:  Diagnosis Date  . Anemia    since bypass  . Arthritis    osteoarthritis  . Asthma    states no asthma attack since 2002  . Back pain   . Breast cancer ([place]) 06/26/16 bx   left breast  . Breast cancer ([place])   . Chronic back pain   . Complication of anesthesia    states takes more than normal to put her to sleep  . Constipation   . Dental bridge present    upper  . Dental crowns present   . DVT of upper extremity (deep vein thrombosis) ([place])   . Fibromyalgia   . Genetic testing 09/19/2016   Ms. Sweetser underwent genetic counseling and testing for hereditary cancer syndromes on 08/21/2016. Her results were negative for mutations in all 46 genes analyzed by [facility]'s 46-gene Common Hereditary Cancers Panel. Genes analyzed include: APC, ATM, AXIN2, BARD1, BMPR1A, BRCA1, BRCA2, BRIP1, CDH1, CDKN2A, CHEK2, CTNNA1, DICER1, EPCAM, GREM1, HOXB13, KIT, MEN1, MLH1, MSH2, MSH3, MSH6, MUTYH, NBN,   . Headache(784.0)    migraines  . History of blood transfusion 06/2005  . History of gallstones   . History of pneumonia   . History of shingles 07/2011  . History  of shingles   . HTN (hypertension)   . Itching   . Joint pain   . Muscle weakness   . Neuropathy   . Normal coronary arteries 2003  . Osteoarthropathy   . Personal history of chemotherapy 2018  . Personal history of radiation therapy 2018  . PFO (patent foramen ovale)   . Presence of inferior vena cava filter   . Pulmonary embolism ([place])   . Sleep apnea    used CPAP until after bypass surg.  . Status post gastric bypass for obesity   . Stomach pain   . Stroke [facility]) 12/2002   right-sided weakness  . Urinary incontinence     Patient Active Problem List   Diagnosis Date Noted  . Class 2 severe obesity with serious comorbidity and body mass index (BMI) of 37.0 to 37.9 in adult [facility]) 01/15/2019  . Status post gastric surgery 07/29/2018  . Prediabetes 03/06/2018  . Vitamin D deficiency 02/18/2018  . Chemotherapy-induced peripheral neuropathy ([place]) 02/15/2017  . Port catheter in place 11/02/2016  . Genetic testing 09/19/2016  . Pre-operative clearance 07/23/2016  . Malignant neoplasm of lower-outer quadrant of left breast of female, estrogen receptor positive ([place]) 07/10/2016  . Essential hypertension   . Chest pain 09/30/2015  . DVT, lower extremity ([place]) 06/18/2011  . DVT of upper  extremity (deep vein thrombosis) ([place]) 06/18/2011  . [place] filter in place 06/18/2011  . History of stroke 06/18/2011  . PFO (patent foramen ovale) 06/18/2011  . Status post gastric bypass for obesity 06/18/2011  . PELVIC PAIN, CHRONIC 07/20/2008  . FIBROIDS, UTERUS 07/16/2008  . ASTHMA 07/16/2008  . FIBROMYALGIA 07/16/2008  . CARPAL TUNNEL SYNDROME, HX OF 07/16/2008  . History of pulmonary embolus (PE) 07/16/2008    Past Surgical History:  Procedure Laterality Date  . ABDOMINAL HYSTERECTOMY  11/1997   complete  . ANAL RECTAL MANOMETRY N/A 01/21/2019   Procedure: ANO RECTAL MANOMETRY;  Surgeon: Arta Silence, MD;  Location: [facility];  Service: Endoscopy;  Laterality: N/A;  . ANTERIOR  CERVICAL DECOMP/DISCECTOMY FUSION  02/05/2005   C5-6  . APPENDECTOMY  10/22/2008   laparoscopic  . BREAST LUMPECTOMY Left 07/2016  . BREAST LUMPECTOMY WITH RADIOACTIVE SEED AND SENTINEL LYMPH NODE BIOPSY Left 07/26/2016   Procedure: BREAST LUMPECTOMY WITH RADIOACTIVE SEED AND SENTINEL LYMPH NODE BIOPSY;  Surgeon: Erroll Luna, MD;  Location: [facility];  Service: General;  Laterality: Left;  . BUNIONECTOMY  05/1980   both feet  . BUNIONECTOMY  08/2011   left foot  . CARDIAC CATHETERIZATION  03/04/2002  . CARPAL TUNNEL RELEASE  06/21/2009   right  . CARPAL TUNNEL RELEASE     left hand  . CARPAL TUNNEL RELEASE  10/03/2011   Procedure: CARPAL TUNNEL RELEASE;  Surgeon: Wynonia Sours, MD;  Location: [facility];  Service: Orthopedics;  Laterality: Right;  CARPAL TUNNEL WITH HYPOTHENAR FAT PAD TRANSFER  . CERVICAL SPINE SURGERY  01/2005   titanium plate implanted  . CHOLECYSTECTOMY  1990  . ELBOW SURGERY  08/09/2004   decompression ulnar nerve right elbow  . ENTEROLYSIS  10/22/2008   laparoscopic abd. enterolysis  . GASTRIC ROUX-EN-Y  2009  . HEEL SPUR SURGERY  08/1997   left  . HEMORRHOID SURGERY  03/1993  . LAPAROSCOPIC LYSIS INTESTINAL ADHESIONS  02/14/2000  . NAILBED REPAIR  01/10/2005; 08/2011   exc. matrix bilat. great toe  . OTHER SURGICAL HISTORY  12/1986   pt states that she had surgery to unclog her fallopean tubes  . PORTACATH PLACEMENT N/A 09/24/2016   Procedure: INSERTION PORT-A-CATH WITH [name];  Surgeon: Erroll Luna, MD;  Location: [facility];  Service: General;  Laterality: N/A;  . SHOULDER SURGERY     bilat. - (left:  06/2005)  . TONSILLECTOMY  07/1995  . TRIGGER FINGER RELEASE  04/25/2006   decompression A-1 pulley left thumb  . TRIGGER FINGER RELEASE Right 11/11/2018   Procedure: RELEASE TRIGGER FINGER/A-1 PULLEY RIGHT THUMB, RIGHT RING FINGER;  Surgeon: Daryll Brod, MD;  Location: [facility];  Service: Orthopedics;  Laterality: Right;  . UTERINE  FIBROID SURGERY  12/95, 7/96   x2  . VENA CAVA FILTER PLACEMENT  2009   during Roux-en-Y surg.     OB History    Gravida  3   Para  3   Term      Preterm      AB      Living        SAB      IAB      Ectopic      Multiple      Live Births              Family History  Problem Relation Age of Onset  . Breast cancer Paternal Aunt 76  . Cervical cancer Paternal  Grandmother 40       d.40s  . Ovarian cancer Maternal Grandmother 23       d.23  . Colon polyps Father   . Diabetes Father        borderline  . Prostate cancer Father   . High blood pressure Father   . Hypertension Mother   . Breast cancer Mother   . Hypertension Other   . Breast cancer Sister 81       treated with neoadjuvant chemo/radiation and lumpectomy  . Breast cancer Sister     Social History   Tobacco Use  . Smoking status: Former Research scientist (life sciences)  . Smokeless tobacco: Never Used  . Tobacco comment: quit smoking 08/1989  Substance Use Topics  . Alcohol use: No  . Drug use: No    Home Medications Prior to Admission medications   Medication Sig Start Date End Date Taking? Authorizing Provider  albuterol (VENTOLIN HFA) 108 (90 Base) MCG/ACT inhaler Inhale 2 puffs into the lungs every 6 (six) hours as needed for wheezing or shortness of breath.    [provider]  aspirin EC 81 MG tablet Take 81 mg by mouth daily.    [provider]  Calcium Citrate-Vitamin D (CALCIUM CITRATE +D PO) Take 2 tablets by mouth daily. Calcium 600 mg    [provider]  Cholecalciferol (VITAMIN D3) 1.25 MG (50000 UT) CAPS Take 1 capsule by mouth every 14 (fourteen) days. 05/03/20   Jearld Lesch A, DO  ferrous gluconate (FERGON) 324 MG tablet Take 1 tablet (324 mg total) by mouth daily with breakfast. 05/03/20   Jearld Lesch A, DO  hydrOXYzine (ATARAX/VISTARIL) 25 MG tablet Take 25 mg by mouth 3 (three) times daily as needed.    [provider]  letrozole (FEMARA) 2.5 MG tablet Take 1  tablet (2.5 mg total) by mouth daily. 07/16/19   Nicholas Lose, MD  Methocarbamol (ROBAXIN-750 PO) Robaxin    [provider]  mirabegron ER (MYRBETRIQ) 25 MG TB24 tablet Take 25 mg by mouth daily.    [provider]  mometasone (ELOCON) 0.1 % cream Apply 1 application topically daily as needed (rash - summer eczema). Apply to arms    [provider]  Multiple Vitamin (MULTIVITAMIN WITH MINERALS) TABS tablet Take 1 tablet by mouth at bedtime.    [provider]  silver sulfADIAZINE (SILVADENE) 1 % cream Apply 1 application topically 2 (two) times daily as needed (stomach tears). 11/09/16   Gardenia Phlegm, NP  solifenacin (VESICARE) 10 MG tablet  05/13/18   [provider]  valACYclovir (VALTREX) 1000 MG tablet Take 1 tablet (1,000 mg total) by mouth 3 (three) times daily. 11/22/17   Frederica Kuster, PA-C    Allergies    Aspirin, Oxycodone hcl, Propoxyphene n-acetaminophen, Tramadol, Adhesive [tape], and Prednisone  Review of Systems   Review of Systems  Constitutional: Positive for appetite change.  HENT: Negative for congestion.   Respiratory: Negative for shortness of breath.   Cardiovascular: Negative for chest pain.  Gastrointestinal: Positive for abdominal pain, diarrhea, nausea and vomiting.  Genitourinary: Negative for flank pain.  Musculoskeletal: Negative for back pain.  Skin: Negative for rash.  Neurological: Negative for weakness.  Psychiatric/Behavioral: Negative for confusion.    Physical Exam Updated Vital Signs BP (!) 115/55   Pulse 70   Temp 98.5 F (36.9 C) (Oral)   Resp 18   Ht _0  (1.549 m)   Wt 92.5 kg   SpO2 100%  BMI 38.55 kg/m   Physical Exam Vitals and nursing note reviewed.  Constitutional:      Appearance: She is well-developed.  HENT:     Head: Normocephalic.  Eyes:     Pupils: Pupils are equal, round, and reactive to light.  Cardiovascular:     Rate and Rhythm: Normal rate.  Abdominal:      Hernia: No hernia is present.     Comments: Lower abdominal tenderness without rebound or guarding.  No hernia palpated.  No distention.  Skin:    General: Skin is warm.     Capillary Refill: Capillary refill takes less than 2 seconds.  Neurological:     Mental Status: She is alert and oriented to person, place, and time.     ED Results / Procedures / Treatments   Labs (all labs ordered are listed, but only abnormal results are displayed) Labs Reviewed  COMPREHENSIVE METABOLIC PANEL - Abnormal; Notable for the following components:      Result Value   Glucose, Bld 109 (*)    BUN 21 (*)    All other components within normal limits  CBC WITH DIFFERENTIAL/PLATELET - Abnormal; Notable for the following components:   Neutro Abs 8.1 (*)    All other components within normal limits  LIPASE, BLOOD    EKG None  Radiology CT ABDOMEN PELVIS WO CONTRAST  Result Date: 07/28/2020 CLINICAL DATA:  Abdominal pain, bowel obstruction suspected. EXAM: CT ABDOMEN AND PELVIS WITHOUT CONTRAST TECHNIQUE: Multidetector CT imaging of the abdomen and pelvis was performed following the standard protocol without IV contrast. COMPARISON:  CT abdomen pelvis July 17, 2018 and MRI abdomen August 04, 2018 FINDINGS: Lower chest: No acute abnormality. Normal size heart. No significant pericardial effusion/thickening. Hepatobiliary: No suspicious hepatic lesion. Gallbladder is surgically absent. No biliary ductal dilation. Pancreas: Unremarkable Spleen: Unremarkable Adrenals/Urinary Tract: Adrenal glands are unremarkable. Kidneys are normal, without renal calculi, focal lesion, or hydronephrosis. Bladder is unremarkable. Stomach/Bowel: Postsurgical changes of gastric bypass, with similar positioning of the anastomotic sutures in comparison to prior. There is mild wall thickening of the proximal aspect of the afferent loop on image 12/2 and 15/2. There is no evidence of obstruction. No abnormal small bowel wall  thickening or dilation. No suspicious colonic wall thickening or mass like lesions. Vascular/Lymphatic: Aortic atherosclerosis. Infrarenal IVC filter. No enlarged abdominal or pelvic lymph nodes. Reproductive: Status post hysterectomy. No adnexal masses. Other: No abdominopelvic ascites. Musculoskeletal: Multilevel degenerative changes spine. No acute osseous abnormality IMPRESSION: 1. Postsurgical changes of gastric bypass with mild wall thickening of the proximal aspect of the afferent loop, which may represent focal enteritis. No evidence of obstruction. 2. Aortic atherosclerosis. Aortic Atherosclerosis (ICD10-I70.0). Electronically Signed   By: Dahlia Bailiff MD   On: 07/28/2020 10:31    Procedures Procedures   Medications Ordered in ED Medications  iohexol (OMNIPAQUE) 300 MG/ML solution 100 mL ( Intravenous Canceled Entry 07/28/20 0924)  fentaNYL (SUBLIMAZE) injection 50 mcg (has no administration in time range)  ondansetron (ZOFRAN) injection 4 mg (4 mg Intravenous Given 07/28/20 0849)  fentaNYL (SUBLIMAZE) injection 50 mcg (50 mcg Intravenous Given 07/28/20 0850)    ED Course  I have reviewed the triage vital signs and the nursing notes.  Pertinent labs & imaging results that were available during my care of the patient were reviewed by me and considered in my medical decision making (see chart for details).    MDM Rules/Calculators/A&P  Patient presents with abdominal pain.  Diffuse.  Has had previous pains in the past with bowel obstructions.  Not having much distention this time.  White count and hemoglobin reassuring, CT scan done and showed potentially an enteritis near the anastomosis from her prior gastric bypass.  However while in the ER pain returned.  Continued to be severe.  And developed bright red rectal bleeding.  With rectal bleeding continued pain it feels the patient benefit from admission to the hospital for further evaluation.  Will discuss with  hospitalist. Final Clinical Impression(s) / ED Diagnoses Final diagnoses:  Generalized abdominal pain  Enteritis  Gastrointestinal hemorrhage, unspecified gastrointestinal hemorrhage type    Rx / DC Orders ED Discharge Orders    None       Davonna Belling, MD 07/28/20 1210

## 2020-07-28 NOTE — ED Triage Notes (Signed)
Lower mid abdominal pain since 2100 last night, vomiting x 1, diarrhea multiple times, patient reports chills, sweating (now resolved).

## 2020-07-28 NOTE — ED Notes (Signed)
Warm pack applied to right AC after IV infiltration.

## 2020-07-28 NOTE — H&P (Signed)
History and Physical   [name] HUO:[id number] DOB: 1961/02/26 DOA: 07/28/2020  Referring MD/NP/PA: Dr. Davonna Belling  PCP: [name], MD   Outpatient Specialists: None  Patient coming from: [place]  Chief Complaint: Abdominal pain and rectal bleed  HPI: [name] is a 60 y.o. female with medical history significant of gastric bypass surgery done at [facility] in 2009, osteoarthritis, asthma, breast cancer, DVT with previous IVC filter placed currently not on anticoagulation, who presents with rectal bleed abdominal pain and diarrhea.  Patient reported sudden onset of the mid abdominal pain colicky in nature rated as 7 out of 10.  It started last night after she ate some food.  She then developed multiple episodes of diarrhea.  Had one episode of vomiting.  Currently pain has subsided but still having rectal bleed when she goes to the toilet.  Patient is therefore being admitted for evaluation of this rectal bleed.  CT abdomen pelvis showed what appears to be an enteritis..  ED Course: Vitals are entirely within normal.  Hemoglobin 12.6.  Chemistry appears to be within normal.  Repeat hemoglobin so far stable at above 12 g.  COVID-19 screen is negative.  HIV screen negative.  CT abdomen pelvis showed evidence of enteritis.  Patient being admitted to the hospital for monitoring.  Review of Systems: As per HPI otherwise 10 point review of systems negative.    Past Medical History:  Diagnosis Date  . Anemia    since bypass  . Arthritis    osteoarthritis  . Asthma    states no asthma attack since 2002  . Back pain   . Breast cancer ([place]) 06/26/16 bx   left breast  . Breast cancer ([place])   . Chronic back pain   . Complication of anesthesia    states takes more than normal to put her to sleep  . Constipation   . Dental bridge present    upper  . Dental crowns present   . DVT of upper extremity (deep vein thrombosis) ([place])   . Fibromyalgia    . Genetic testing 09/19/2016   Ms. Yo underwent genetic counseling and testing for hereditary cancer syndromes on 08/21/2016. Her results were negative for mutations in all 46 genes analyzed by [facility]'s 46-gene Common Hereditary Cancers Panel. Genes analyzed include: APC, ATM, AXIN2, BARD1, BMPR1A, BRCA1, BRCA2, BRIP1, CDH1, CDKN2A, CHEK2, CTNNA1, DICER1, EPCAM, GREM1, HOXB13, KIT, MEN1, MLH1, MSH2, MSH3, MSH6, MUTYH, NBN,   . Headache(784.0)    migraines  . History of blood transfusion 06/2005  . History of gallstones   . History of pneumonia   . History of shingles 07/2011  . History of shingles   . HTN (hypertension)   . Itching   . Joint pain   . Muscle weakness   . Neuropathy   . Normal coronary arteries 2003  . Osteoarthropathy   . Personal history of chemotherapy 2018  . Personal history of radiation therapy 2018  . PFO (patent foramen ovale)   . Presence of inferior vena cava filter   . Pulmonary embolism ([place])   . Sleep apnea    used CPAP until after bypass surg.  . Status post gastric bypass for obesity   . Stomach pain   . Stroke [facility]) 12/2002   right-sided weakness  . Urinary incontinence     Past Surgical History:  Procedure Laterality Date  . ABDOMINAL HYSTERECTOMY  11/1997   complete  . ANAL RECTAL MANOMETRY N/A 01/21/2019  Procedure: ANO RECTAL MANOMETRY;  Surgeon: Arta Silence, MD;  Location: [facility];  Service: Endoscopy;  Laterality: N/A;  . ANTERIOR CERVICAL DECOMP/DISCECTOMY FUSION  02/05/2005   C5-6  . APPENDECTOMY  10/22/2008   laparoscopic  . BREAST LUMPECTOMY Left 07/2016  . BREAST LUMPECTOMY WITH RADIOACTIVE SEED AND SENTINEL LYMPH NODE BIOPSY Left 07/26/2016   Procedure: BREAST LUMPECTOMY WITH RADIOACTIVE SEED AND SENTINEL LYMPH NODE BIOPSY;  Surgeon: Erroll Luna, MD;  Location: [place];  Service: General;  Laterality: Left;  . BUNIONECTOMY  05/1980   both feet  . BUNIONECTOMY  08/2011   left foot  . CARDIAC CATHETERIZATION   03/04/2002  . CARPAL TUNNEL RELEASE  06/21/2009   right  . CARPAL TUNNEL RELEASE     left hand  . CARPAL TUNNEL RELEASE  10/03/2011   Procedure: CARPAL TUNNEL RELEASE;  Surgeon: Wynonia Sours, MD;  Location: [place];  Service: Orthopedics;  Laterality: Right;  CARPAL TUNNEL WITH HYPOTHENAR FAT PAD TRANSFER  . CERVICAL SPINE SURGERY  01/2005   titanium plate implanted  . CHOLECYSTECTOMY  1990  . ELBOW SURGERY  08/09/2004   decompression ulnar nerve right elbow  . ENTEROLYSIS  10/22/2008   laparoscopic abd. enterolysis  . GASTRIC ROUX-EN-Y  2009  . HEEL SPUR SURGERY  08/1997   left  . HEMORRHOID SURGERY  03/1993  . LAPAROSCOPIC LYSIS INTESTINAL ADHESIONS  02/14/2000  . NAILBED REPAIR  01/10/2005; 08/2011   exc. matrix bilat. great toe  . OTHER SURGICAL HISTORY  12/1986   pt states that she had surgery to unclog her fallopean tubes  . PORTACATH PLACEMENT N/A 09/24/2016   Procedure: INSERTION PORT-A-CATH WITH [name];  Surgeon: Erroll Luna, MD;  Location: [place];  Service: General;  Laterality: N/A;  . SHOULDER SURGERY     bilat. - (left:  06/2005)  . TONSILLECTOMY  07/1995  . TRIGGER FINGER RELEASE  04/25/2006   decompression A-1 pulley left thumb  . TRIGGER FINGER RELEASE Right 11/11/2018   Procedure: RELEASE TRIGGER FINGER/A-1 PULLEY RIGHT THUMB, RIGHT RING FINGER;  Surgeon: Daryll Brod, MD;  Location: [place];  Service: Orthopedics;  Laterality: Right;  . UTERINE FIBROID SURGERY  12/95, 7/96   x2  . VENA CAVA FILTER PLACEMENT  2009   during Roux-en-Y surg.     reports that she has quit smoking. She has never used smokeless tobacco. She reports that she does not drink alcohol and does not use drugs.  Allergies  Allergen Reactions  . Aspirin Other (See Comments)    ESOPHAGITIS  . Oxycodone Hcl Diarrhea and Nausea And Vomiting  . Propoxyphene N-Acetaminophen Diarrhea and Nausea And Vomiting  . Tramadol Other (See Comments)    Cause migraines  . Adhesive  [Tape] Rash and Other (See Comments)    Pulls skin off - please use paper tape  . Prednisone Rash    Family History  Problem Relation Age of Onset  . Breast cancer Paternal Aunt 77  . Cervical cancer Paternal Grandmother 45       d.40s  . Ovarian cancer Maternal Grandmother 23       d.23  . Colon polyps Father   . Diabetes Father        borderline  . Prostate cancer Father   . High blood pressure Father   . Hypertension Mother   . Breast cancer Mother   . Hypertension Other   . Breast cancer Sister 42  treated with neoadjuvant chemo/radiation and lumpectomy  . Breast cancer Sister      Prior to Admission medications   Medication Sig Start Date End Date Taking? Authorizing Provider  albuterol (VENTOLIN HFA) 108 (90 Base) MCG/ACT inhaler Inhale 2 puffs into the lungs every 6 (six) hours as needed for wheezing or shortness of breath.    [provider]  aspirin EC 81 MG tablet Take 81 mg by mouth daily.    [provider]  Calcium Citrate-Vitamin D (CALCIUM CITRATE +D PO) Take 2 tablets by mouth daily. Calcium 600 mg    [provider]  Cholecalciferol (VITAMIN D3) 1.25 MG (50000 UT) CAPS Take 1 capsule by mouth every 14 (fourteen) days. 05/03/20   Jearld Lesch A, DO  ferrous gluconate (FERGON) 324 MG tablet Take 1 tablet (324 mg total) by mouth daily with breakfast. 05/03/20   Jearld Lesch A, DO  hydrOXYzine (ATARAX/VISTARIL) 25 MG tablet Take 25 mg by mouth 3 (three) times daily as needed.    [provider]  letrozole (FEMARA) 2.5 MG tablet Take 1 tablet (2.5 mg total) by mouth daily. 07/16/19   Nicholas Lose, MD  Methocarbamol (ROBAXIN-750 PO) Robaxin    [provider]  mirabegron ER (MYRBETRIQ) 25 MG TB24 tablet Take 25 mg by mouth daily.    [provider]  mometasone (ELOCON) 0.1 % cream Apply 1 application topically daily as needed (rash - summer eczema). Apply to arms    [provider]  Multiple Vitamin  (MULTIVITAMIN WITH MINERALS) TABS tablet Take 1 tablet by mouth at bedtime.    [provider]  silver sulfADIAZINE (SILVADENE) 1 % cream Apply 1 application topically 2 (two) times daily as needed (stomach tears). 11/09/16   Gardenia Phlegm, NP  solifenacin (VESICARE) 10 MG tablet  05/13/18   [provider]  valACYclovir (VALTREX) 1000 MG tablet Take 1 tablet (1,000 mg total) by mouth 3 (three) times daily. 11/22/17   Frederica Kuster, PA-C    Physical Exam: Vitals:   07/28/20 1333 07/28/20 1630 07/28/20 1817 07/28/20 2012  BP: (!) 127/59 136/69 110/75 124/75  Pulse: 61 65 72 60  Resp: 16 18 18 14   Temp: 98 F (36.7 C)   97.6 F (36.4 C)  TempSrc:    Oral  SpO2: 100% 100% 100% 100%  Weight:    91.8 kg  Height:    5' 1"  (1.549 m)      Constitutional: Anxious, no distress Vitals:   07/28/20 1333 07/28/20 1630 07/28/20 1817 07/28/20 2012  BP: (!) 127/59 136/69 110/75 124/75  Pulse: 61 65 72 60  Resp: 16 18 18 14   Temp: 98 F (36.7 C)   97.6 F (36.4 C)  TempSrc:    Oral  SpO2: 100% 100% 100% 100%  Weight:    91.8 kg  Height:    5' 1"  (1.549 m)   Eyes: PERRL, lids and conjunctivae normal ENMT: Mucous membranes are dry posterior pharynx clear of any exudate or lesions.Normal dentition.  Neck: normal, supple, no masses, no thyromegaly Respiratory: clear to auscultation bilaterally, no wheezing, no crackles. Normal respiratory effort. No accessory muscle use.  Cardiovascular: Regular rate and rhythm, no murmurs / rubs / gallops. No extremity edema. 2+ pedal pulses. No carotid bruits.  Abdomen: Mild mid abdominal tenderness, no masses palpated. No hepatosplenomegaly. Bowel sounds positive.  Musculoskeletal: no clubbing / cyanosis. No joint deformity upper and lower extremities. Good ROM, no contractures. Normal muscle tone.  Skin: no  rashes, lesions, ulcers. No induration Neurologic: CN 2-12 grossly intact. Sensation intact, DTR normal. Strength 5/5 in  all 4.  Psychiatric: Normal judgment and insight. Alert and oriented x 3. Normal mood.     Labs on Admission: I have personally reviewed following labs and imaging studies  CBC: Recent Labs  Lab 07/28/20 0836  WBC 10.3  NEUTROABS 8.1*  HGB 12.6  HCT 38.0  MCV 95.0  PLT 161   Basic Metabolic Panel: Recent Labs  Lab 07/28/20 0836  NA 137  K 3.5  CL 102  CO2 26  GLUCOSE 109*  BUN 21*  CREATININE 0.86  CALCIUM 9.1   GFR: Estimated Creatinine Clearance: 73.6 mL/min (by C-G formula based on SCr of 0.86 mg/dL). Liver Function Tests: Recent Labs  Lab 07/28/20 0836  AST 17  ALT 15  ALKPHOS 63  BILITOT 0.6  PROT 7.2  ALBUMIN 4.3   Recent Labs  Lab 07/28/20 0836  LIPASE 29   No results for input(s): AMMONIA in the last 168 hours. Coagulation Profile: No results for input(s): INR, PROTIME in the last 168 hours. Cardiac Enzymes: No results for input(s): CKTOTAL, CKMB, CKMBINDEX, TROPONINI in the last 168 hours. BNP (last 3 results) No results for input(s): PROBNP in the last 8760 hours. HbA1C: No results for input(s): HGBA1C in the last 72 hours. CBG: No results for input(s): GLUCAP in the last 168 hours. Lipid Profile: No results for input(s): CHOL, HDL, LDLCALC, TRIG, CHOLHDL, LDLDIRECT in the last 72 hours. Thyroid Function Tests: No results for input(s): TSH, T4TOTAL, FREET4, T3FREE, THYROIDAB in the last 72 hours. Anemia Panel: No results for input(s): VITAMINB12, FOLATE, FERRITIN, TIBC, IRON, RETICCTPCT in the last 72 hours. Urine analysis:    Component Value Date/Time   LABSPEC 1.025 11/23/2016 1017   PHURINE 6.0 11/23/2016 1017   GLUCOSEU Negative 11/23/2016 1017   HGBUR Moderate 11/23/2016 1017   BILIRUBINUR Negative 11/23/2016 1017   KETONESUR Negative 11/23/2016 1017   PROTEINUR Negative 11/23/2016 1017   UROBILINOGEN 0.2 11/23/2016 1017   NITRITE Negative 11/23/2016 1017   LEUKOCYTESUR Negative 11/23/2016 1017   Sepsis  Labs: @LABRCNTIP (procalcitonin:4,lacticidven:4) ) Recent Results (from the past 240 hour(s))  Resp Panel by RT-PCR (Flu A&B, Covid) Nasopharyngeal Swab     Status: None   Collection Time: 07/28/20 12:21 PM   Specimen: Nasopharyngeal Swab; Nasopharyngeal(NP) swabs in vial transport medium  Result Value Ref Range Status   SARS Coronavirus 2 by RT PCR NEGATIVE NEGATIVE Final    Comment: (NOTE) SARS-CoV-2 target nucleic acids are NOT DETECTED.  The SARS-CoV-2 RNA is generally detectable in upper respiratory specimens during the acute phase of infection. The lowest concentration of SARS-CoV-2 viral copies this assay can detect is 138 copies/mL. A negative result does not preclude SARS-Cov-2 infection and should not be used as the sole basis for treatment or other patient management decisions. A negative result may occur with  improper specimen collection/handling, submission of specimen other than nasopharyngeal swab, presence of viral mutation(s) within the areas targeted by this assay, and inadequate number of viral copies(<138 copies/mL). A negative result must be combined with clinical observations, patient history, and epidemiological information. The expected result is Negative.  Fact Sheet for Patients:  EntrepreneurPulse.com.au  Fact Sheet for Healthcare Providers:  IncredibleEmployment.be  This test is no t yet approved or cleared by the [place] FDA and  has been authorized for detection and/or diagnosis of SARS-CoV-2 by FDA under an Emergency Use Authorization (EUA). This EUA will remain  in effect (meaning this test can be used) for the duration of the COVID-19 declaration under Section 564(b)(1) of the Act, 21 U.S.C.section 360bbb-3(b)(1), unless the authorization is terminated  or revoked sooner.       Influenza A by PCR NEGATIVE NEGATIVE Final   Influenza B by PCR NEGATIVE NEGATIVE Final    Comment: (NOTE) The Xpert Xpress  SARS-CoV-2/FLU/RSV plus assay is intended as an aid in the diagnosis of influenza from Nasopharyngeal swab specimens and should not be used as a sole basis for treatment. Nasal washings and aspirates are unacceptable for Xpert Xpress SARS-CoV-2/FLU/RSV testing.  Fact Sheet for Patients: EntrepreneurPulse.com.au  Fact Sheet for Healthcare Providers: IncredibleEmployment.be  This test is not yet approved or cleared by the [place] FDA and has been authorized for detection and/or diagnosis of SARS-CoV-2 by FDA under an Emergency Use Authorization (EUA). This EUA will remain in effect (meaning this test can be used) for the duration of the COVID-19 declaration under Section 564(b)(1) of the Act, 21 U.S.C. section 360bbb-3(b)(1), unless the authorization is terminated or revoked.  Performed at [facility], [address] [place], [name] [address]      Radiological Exams on Admission: CT ABDOMEN PELVIS WO CONTRAST  Result Date: 07/28/2020 CLINICAL DATA:  Abdominal pain, bowel obstruction suspected. EXAM: CT ABDOMEN AND PELVIS WITHOUT CONTRAST TECHNIQUE: Multidetector CT imaging of the abdomen and pelvis was performed following the standard protocol without IV contrast. COMPARISON:  CT abdomen pelvis July 17, 2018 and MRI abdomen August 04, 2018 FINDINGS: Lower chest: No acute abnormality. Normal size heart. No significant pericardial effusion/thickening. Hepatobiliary: No suspicious hepatic lesion. Gallbladder is surgically absent. No biliary ductal dilation. Pancreas: Unremarkable Spleen: Unremarkable Adrenals/Urinary Tract: Adrenal glands are unremarkable. Kidneys are normal, without renal calculi, focal lesion, or hydronephrosis. Bladder is unremarkable. Stomach/Bowel: Postsurgical changes of gastric bypass, with similar positioning of the anastomotic sutures in comparison to prior. There is mild wall thickening of the proximal aspect  of the afferent loop on image 12/2 and 15/2. There is no evidence of obstruction. No abnormal small bowel wall thickening or dilation. No suspicious colonic wall thickening or mass like lesions. Vascular/Lymphatic: Aortic atherosclerosis. Infrarenal IVC filter. No enlarged abdominal or pelvic lymph nodes. Reproductive: Status post hysterectomy. No adnexal masses. Other: No abdominopelvic ascites. Musculoskeletal: Multilevel degenerative changes spine. No acute osseous abnormality IMPRESSION: 1. Postsurgical changes of gastric bypass with mild wall thickening of the proximal aspect of the afferent loop, which may represent focal enteritis. No evidence of obstruction. 2. Aortic atherosclerosis. Aortic Atherosclerosis (ICD10-I70.0). Electronically Signed   By: Dahlia Bailiff MD   On: 07/28/2020 10:31      Assessment/Plan Principal Problem:   Abdominal pain Active Problems:   Asthma   History of pulmonary embolus (PE)   PFO (patent foramen ovale)   Essential hypertension   Enteritis     #1 abdominal pain: Appears to be acute enteritis on CT.  Probably related to food poisoning.  Monitor patient.  Monitor H&H.  #2 reported rectal bleed: Super patient is hemodynamically stable and H&H is stable.  Will monitor.  If any rectal bleeds observed will get GI consultation.  In the meantime I will empirically start Protonix IV and monitor.  #3 history of PE: Remotely.  Not on anticoagulation.  Due to reported GI bleed we will watch and no anticoagulation.  #4 essential hypertension: Confirm and resume home regimen.  #5 asthma: No acute exacerbation.   DVT prophylaxis: SCD Code Status: Full  code Family Communication: No family at bedside Disposition Plan: Home Consults called: None Admission status: Inpatient  Severity of Illness: The appropriate patient status for this patient is INPATIENT. Inpatient status is judged to be reasonable and necessary in order to provide the required intensity of  service to ensure the patient's safety. The patient's presenting symptoms, physical exam findings, and initial radiographic and laboratory data in the context of their chronic comorbidities is felt to place them at high risk for further clinical deterioration. Furthermore, it is not anticipated that the patient will be medically stable for discharge from the hospital within 2 midnights of admission. The following factors support the patient status of inpatient.   " The patient's presenting symptoms include abdominal pain and GI bleed. " The worrisome physical exam findings include no significant findings on exam. " The initial radiographic and laboratory data are worrisome because of mildly abdominal tenderness. " The chronic co-morbidities include history of gastric bypass surgery.   * I certify that at the point of admission it is my clinical judgment that the patient will require inpatient hospital care spanning beyond 2 midnights from the point of admission due to high intensity of service, high risk for further deterioration and high frequency of surveillance required.Barbette Merino MD [facility] Pager [phone]  If 7PM-7AM, please contact night-coverage [email or web address] Password [facility]  07/28/2020, 10:26 PM

## 2020-07-28 NOTE — ED Notes (Signed)
Patient now having some bright red rectal bleeding

## 2020-07-29 ENCOUNTER — Encounter (INDEPENDENT_AMBULATORY_CARE_PROVIDER_SITE_OTHER): Payer: Self-pay | Admitting: Bariatrics

## 2020-07-29 DIAGNOSIS — R1084 Generalized abdominal pain: Secondary | ICD-10-CM

## 2020-07-29 DIAGNOSIS — I10 Essential (primary) hypertension: Secondary | ICD-10-CM

## 2020-07-29 DIAGNOSIS — K529 Noninfective gastroenteritis and colitis, unspecified: Secondary | ICD-10-CM

## 2020-07-29 LAB — COMPREHENSIVE METABOLIC PANEL
ALT: 16 U/L (ref 0–44)
AST: 17 U/L (ref 15–41)
Albumin: 3.3 g/dL — ABNORMAL LOW (ref 3.5–5.0)
Alkaline Phosphatase: 57 U/L (ref 38–126)
Anion gap: 7 (ref 5–15)
BUN: 13 mg/dL (ref 6–20)
CO2: 24 mmol/L (ref 22–32)
Calcium: 8.6 mg/dL — ABNORMAL LOW (ref 8.9–10.3)
Chloride: 105 mmol/L (ref 98–111)
Creatinine, Ser: 0.65 mg/dL (ref 0.44–1.00)
GFR, Estimated: >60 mL/min (ref >60)
Glucose, Bld: 132 mg/dL — ABNORMAL HIGH (ref 70–99)
Potassium: 3.5 mmol/L (ref 3.5–5.1)
Sodium: 136 mmol/L (ref 135–145)
Total Bilirubin: 0.8 mg/dL (ref 0.3–1.2)
Total Protein: 5.8 g/dL — ABNORMAL LOW (ref 6.5–8.1)

## 2020-07-29 LAB — CBC
HCT: 35.5 % — ABNORMAL LOW (ref 36.0–46.0)
Hemoglobin: 11.8 g/dL — ABNORMAL LOW (ref 12.0–15.0)
MCH: 31.7 pg (ref 26.0–34.0)
MCHC: 33.2 g/dL (ref 30.0–36.0)
MCV: 95.4 fL (ref 80.0–100.0)
Platelets: 174 10*3/uL (ref 150–400)
RBC: 3.72 MIL/uL — ABNORMAL LOW (ref 3.87–5.11)
RDW: 14.8 % (ref 11.5–15.5)
WBC: 7.7 10*3/uL (ref 4.0–10.5)
nRBC: 0 % (ref 0.0–0.2)

## 2020-07-29 MED ORDER — SODIUM CHLORIDE 0.9 % IV SOLN: INTRAVENOUS | Status: DC

## 2020-07-29 MED ORDER — HYDROCODONE-ACETAMINOPHEN 5-325 MG PO TABS
2.0000 | ORAL_TABLET | Freq: Four times a day (QID) | ORAL | Status: DC | PRN
  Administered 2020-07-29 (×2): 2 via ORAL
  Filled 2020-07-29 (×2): qty 2

## 2020-07-29 NOTE — Progress Notes (Signed)
PROGRESS NOTE                                                                             PROGRESS NOTE                                                                                                                                                                                                             Patient Demographics:    Tejasvi Brissett, is a 60 y.o. female, DOB - Apr 04, 1961, QQP:[id number]  Outpatient Primary MD for the patient is [name], MD    LOS - 0  Admit date - 07/28/2020    Chief Complaint  Patient presents with  . Abdominal Pain  . Diarrhea       Brief Narrative     HPI: Lauryl Seyer is a 60 y.o. female with medical history significant of gastric bypass surgery done at [facility] in 2009, osteoarthritis, asthma, breast cancer, DVT with previous IVC filter placed currently not on anticoagulation, who presents with rectal bleed abdominal pain and diarrhea.  Patient reported sudden onset of the mid abdominal pain colicky in nature rated as 7 out of 10.  It started last night after she ate some food.  She then developed multiple episodes of diarrhea.  Had one episode of vomiting.  Currently pain has subsided but still having rectal bleed when she goes to the toilet.  Patient is therefore being admitted for evaluation of this rectal bleed.  CT abdomen pelvis showed what appears to be an enteritis..  ED Course: Vitals are entirely within normal.  Hemoglobin 12.6.  Chemistry appears to be within normal.  Repeat hemoglobin so far stable at above 12 g.  COVID-19 screen is negative.  HIV screen negative.  CT abdomen pelvis showed evidence of enteritis.  Patient being admitted to the hospital for monitoring.   Subjective:    Oleva Koo today reports no further bowel movement or bleed since admission, she does report some epigastric abdominal pain, some nausea as well, and poor oral intake with breakfast  and lunch.     Assessment  & Plan :    Principal Problem:   Abdominal pain Active Problems:   Asthma   History of pulmonary embolus (PE)   PFO (patent foramen ovale)   Essential hypertension   Enteritis  Abdominal pain/nausea/abdominal pain -This is most likely in the setting of enteritis, her appetite remains poor, will start on IV fluids, continue with as needed Zofran, continue with IV and Flagyl. -  As well will start on IV Protonix twice daily .  Reported rectal bleed:  -So far no recurrent during hospital stay, H&H remained stable . -We will obtain GI pathogen panel by PCR when she is having diarrhea to see if she is having a hemorrhagic diarrhea . -Patient report she is following with Dr. Paulita Fujita as an outpatient, ports she had a colonoscopy last year with no acute findings.  History of PE:  - Remotely.  Not on anticoagulation.  Due to reported GI bleed we will watch and no anticoagulation.  Essential hypertension:  - Confirm and resume home regimen.  Asthma:  - No acute exacerbation.   SpO2: 94 %  Recent Labs  Lab 07/28/20 0836 07/28/20 1221 07/29/20 0123  WBC 10.3  --  7.7  PLT 203  --  174  AST 17  --  17  ALT 15  --  16  ALKPHOS 63  --  57  BILITOT 0.6  --  0.8  ALBUMIN 4.3  --  3.3*  SARSCOV2NAA  --  NEGATIVE  --        ABG  No results found for: PHART, PCO2ART, PO2ART, HCO3, TCO2, ACIDBASEDEF, O2SAT         Condition - Extremely Guarded  Family Communication  :  D/W sister by phone.  Code Status :  full  Consults  :  none  PUD Prophylaxis : PPI  Disposition Plan  :    Status is: inpatient  The patient will require care spanning > 2 midnights and should be moved to inpatient because: IV treatments appropriate due to intensity of illness or inability to take PO  Dispo: The patient is from: Home              Anticipated d/c is to: Home              Patient currently is not medically stable to d/c.  Remains poor, started on IV fluids, as well  mains on IV antibiotics for her enteritis, and will need close monitoring of her hemoglobin and clinically given she presents with bright red blood per rectum.     Difficult to place patient No      DVT Prophylaxis  :  SCDs   Lab Results  Component Value Date   PLT 174 07/29/2020    Diet :  Diet Order            DIET SOFT Room service appropriate? Yes; Fluid consistency: Thin  Diet effective now                  Inpatient Medications  Scheduled Meds: . pantoprazole (PROTONIX) IV  40 mg Intravenous Q12H   Continuous Infusions: . sodium chloride    . ciprofloxacin 400 mg (07/29/20 0817)  . metronidazole 500 mg (07/29/20 1315)   PRN Meds:.HYDROcodone-acetaminophen, iohexol, ondansetron **OR** ondansetron (ZOFRAN) IV  Antibiotics  :    Anti-infectives (From admission, onward)   Start     Dose/Rate Route Frequency Ordered Stop   07/28/20  2330  ciprofloxacin (CIPRO) IVPB 400 mg        400 mg 200 mL/hr over 60 Minutes Intravenous Every 12 hours 07/28/20 2227     07/28/20 2245  metroNIDAZOLE (FLAGYL) IVPB 500 mg        500 mg 100 mL/hr over 60 Minutes Intravenous Every 8 hours 07/28/20 2227          Phillips Climes M.D on 07/29/2020 at 1:44 PM  To page go to [email or web address]   [facility] -  Office  [phone]    Objective:   Vitals:   07/28/20 2012 07/29/20 0100 07/29/20 0353 07/29/20 0739  BP: 124/75 133/63 (!) 116/55 126/66  Pulse: 60 72 70 62  Resp: 14 13 14 11   Temp: 97.6 F (36.4 C) 98.5 F (36.9 C) 98.3 F (36.8 C) 98.7 F (37.1 C)  TempSrc: Oral Oral Oral   SpO2: 100% 94% 95% 94%  Weight: 91.8 kg     Height: 5\' 1"  (1.549 m)       Wt Readings from Last 3 Encounters:  07/28/20 91.8 kg  07/15/20 94.9 kg  05/03/20 89.4 kg     Intake/Output Summary (Last 24 hours) at 07/29/2020 1344 Last data filed at 07/29/2020 0300 Gross per 24 hour  Intake 300 ml  Output --  Net 300 ml     Physical Exam  Awake Alert, No new F.N deficits,  Normal affect Symmetrical Chest wall movement, Good air movement bilaterally, CTAB RRR,No Gallops,Rubs or new Murmurs, No Parasternal Heave +ve B.Sounds, Abd Soft, she has mild epigastric tenderness, No rebound - guarding or rigidity. No Cyanosis, Clubbing or edema, No new Rash or bruise      Data Review:    CBC Recent Labs  Lab 07/28/20 0836 07/28/20 2227 07/29/20 0123  WBC 10.3  --  7.7  HGB 12.6 12.1 11.8*  HCT 38.0 36.4 35.5*  PLT 203  --  174  MCV 95.0  --  95.4  MCH 31.5  --  31.7  MCHC 33.2  --  33.2  RDW 14.8  --  14.8  LYMPHSABS 1.3  --   --   MONOABS 0.8  --   --   EOSABS 0.0  --   --   BASOSABS 0.0  --   --     Recent Labs  Lab 07/28/20 0836 07/29/20 0123  NA 137 136  K 3.5 3.5  CL 102 105  CO2 26 24  GLUCOSE 109* 132*  BUN 21* 13  CREATININE 0.86 0.65  CALCIUM 9.1 8.6*  AST 17 17  ALT 15 16  ALKPHOS 63 57  BILITOT 0.6 0.8  ALBUMIN 4.3 3.3*    ------------------------------------------------------------------------------------------------------------------ No results for input(s): CHOL, HDL, LDLCALC, TRIG, CHOLHDL, LDLDIRECT in the last 72 hours.  Lab Results  Component Value Date   HGBA1C 5.7 (H) 11/26/2019   ------------------------------------------------------------------------------------------------------------------ No results for input(s): TSH, T4TOTAL, T3FREE, THYROIDAB in the last 72 hours.  Invalid input(s): FREET3  Cardiac Enzymes No results for input(s): CKMB, TROPONINI, MYOGLOBIN in the last 168 hours.  Invalid input(s): CK ------------------------------------------------------------------------------------------------------------------ No results found for: BNP  Micro Results Recent Results (from the past 240 hour(s))  Resp Panel by RT-PCR (Flu A&B, Covid) Nasopharyngeal Swab     Status: None   Collection Time: 07/28/20 12:21 PM   Specimen: Nasopharyngeal Swab; Nasopharyngeal(NP) swabs in vial transport medium  Result  Value Ref Range Status   SARS Coronavirus 2 by RT PCR NEGATIVE NEGATIVE Final    Comment: (  NOTE) SARS-CoV-2 target nucleic acids are NOT DETECTED.  The SARS-CoV-2 RNA is generally detectable in upper respiratory specimens during the acute phase of infection. The lowest concentration of SARS-CoV-2 viral copies this assay can detect is 138 copies/mL. A negative result does not preclude SARS-Cov-2 infection and should not be used as the sole basis for treatment or other patient management decisions. A negative result may occur with  improper specimen collection/handling, submission of specimen other than nasopharyngeal swab, presence of viral mutation(s) within the areas targeted by this assay, and inadequate number of viral copies(<138 copies/mL). A negative result must be combined with clinical observations, patient history, and epidemiological information. The expected result is Negative.  Fact Sheet for Patients:  EntrepreneurPulse.com.au  Fact Sheet for Healthcare Providers:  IncredibleEmployment.be  This test is no t yet approved or cleared by the [place] FDA and  has been authorized for detection and/or diagnosis of SARS-CoV-2 by FDA under an Emergency Use Authorization (EUA). This EUA will remain  in effect (meaning this test can be used) for the duration of the COVID-19 declaration under Section 564(b)(1) of the Act, 21 U.S.C.section 360bbb-3(b)(1), unless the authorization is terminated  or revoked sooner.       Influenza A by PCR NEGATIVE NEGATIVE Final   Influenza B by PCR NEGATIVE NEGATIVE Final    Comment: (NOTE) The Xpert Xpress SARS-CoV-2/FLU/RSV plus assay is intended as an aid in the diagnosis of influenza from Nasopharyngeal swab specimens and should not be used as a sole basis for treatment. Nasal washings and aspirates are unacceptable for Xpert Xpress SARS-CoV-2/FLU/RSV testing.  Fact Sheet for  Patients: EntrepreneurPulse.com.au  Fact Sheet for Healthcare Providers: IncredibleEmployment.be  This test is not yet approved or cleared by the [place] FDA and has been authorized for detection and/or diagnosis of SARS-CoV-2 by FDA under an Emergency Use Authorization (EUA). This EUA will remain in effect (meaning this test can be used) for the duration of the COVID-19 declaration under Section 564(b)(1) of the Act, 21 U.S.C. section 360bbb-3(b)(1), unless the authorization is terminated or revoked.  Performed at [facility], [address], [place], [place] [address]     Radiology Reports CT ABDOMEN PELVIS WO CONTRAST  Result Date: 07/28/2020 CLINICAL DATA:  Abdominal pain, bowel obstruction suspected. EXAM: CT ABDOMEN AND PELVIS WITHOUT CONTRAST TECHNIQUE: Multidetector CT imaging of the abdomen and pelvis was performed following the standard protocol without IV contrast. COMPARISON:  CT abdomen pelvis July 17, 2018 and MRI abdomen August 04, 2018 FINDINGS: Lower chest: No acute abnormality. Normal size heart. No significant pericardial effusion/thickening. Hepatobiliary: No suspicious hepatic lesion. Gallbladder is surgically absent. No biliary ductal dilation. Pancreas: Unremarkable Spleen: Unremarkable Adrenals/Urinary Tract: Adrenal glands are unremarkable. Kidneys are normal, without renal calculi, focal lesion, or hydronephrosis. Bladder is unremarkable. Stomach/Bowel: Postsurgical changes of gastric bypass, with similar positioning of the anastomotic sutures in comparison to prior. There is mild wall thickening of the proximal aspect of the afferent loop on image 12/2 and 15/2. There is no evidence of obstruction. No abnormal small bowel wall thickening or dilation. No suspicious colonic wall thickening or mass like lesions. Vascular/Lymphatic: Aortic atherosclerosis. Infrarenal IVC filter. No enlarged abdominal or pelvic lymph  nodes. Reproductive: Status post hysterectomy. No adnexal masses. Other: No abdominopelvic ascites. Musculoskeletal: Multilevel degenerative changes spine. No acute osseous abnormality IMPRESSION: 1. Postsurgical changes of gastric bypass with mild wall thickening of the proximal aspect of the afferent loop, which may represent focal enteritis. No evidence of  obstruction. 2. Aortic atherosclerosis. Aortic Atherosclerosis (ICD10-I70.0). Electronically Signed   By: Dahlia Bailiff MD   On: 07/28/2020 10:31

## 2020-07-29 NOTE — Progress Notes (Signed)
Awaiting for patient to get to the unit from HD for blood transfusion; MD aware and had agreed that patient's blood will be transfused after HD.

## 2020-07-29 NOTE — Plan of Care (Signed)
  Problem: Education: Goal: Knowledge of General Education information will improve Description: Including pain rating scale, medication(s)/side effects and non-pharmacologic comfort measures Outcome: Progressing   Problem: Health Behavior/Discharge Planning: Goal: Ability to manage health-related needs will improve Outcome: Progressing   Problem: Clinical Measurements: Goal: Ability to maintain clinical measurements within normal limits will improve Outcome: Progressing   Problem: Nutrition: Goal: Adequate nutrition will be maintained Outcome: Progressing   Problem: Coping: Goal: Level of anxiety will decrease Outcome: Progressing   Problem: Elimination: Goal: Will not experience complications related to bowel motility Outcome: Progressing Goal: Will not experience complications related to urinary retention Outcome: Progressing   Problem: Pain Managment: Goal: General experience of comfort will improve Outcome: Progressing   Problem: Safety: Goal: Ability to remain free from injury will improve Outcome: Progressing   Problem: Skin Integrity: Goal: Risk for impaired skin integrity will decrease Outcome: Progressing   

## 2020-07-29 NOTE — Plan of Care (Signed)
  Problem: Education: Goal: Knowledge of General Education information will improve Description: Including pain rating scale, medication(s)/side effects and non-pharmacologic comfort measures 07/29/2020 0129 by Tobe Sos, RN Outcome: Progressing 07/29/2020 0128 by Tobe Sos, RN Outcome: Progressing   Problem: Health Behavior/Discharge Planning: Goal: Ability to manage health-related needs will improve 07/29/2020 0129 by Tobe Sos, RN Outcome: Progressing 07/29/2020 0128 by Tobe Sos, RN Outcome: Progressing   Problem: Clinical Measurements: Goal: Ability to maintain clinical measurements within normal limits will improve 07/29/2020 0129 by Tobe Sos, RN Outcome: Progressing 07/29/2020 0128 by Tobe Sos, RN Outcome: Progressing   Problem: Nutrition: Goal: Adequate nutrition will be maintained 07/29/2020 0129 by Tobe Sos, RN Outcome: Progressing 07/29/2020 0128 by Tobe Sos, RN Outcome: Progressing   Problem: Coping: Goal: Level of anxiety will decrease 07/29/2020 0129 by Tobe Sos, RN Outcome: Progressing 07/29/2020 0128 by Tobe Sos, RN Outcome: Progressing   Problem: Elimination: Goal: Will not experience complications related to bowel motility 07/29/2020 0129 by Tobe Sos, RN Outcome: Progressing 07/29/2020 0128 by Tobe Sos, RN Outcome: Progressing Goal: Will not experience complications related to urinary retention 07/29/2020 0129 by Tobe Sos, RN Outcome: Progressing 07/29/2020 0128 by Tobe Sos, RN Outcome: Progressing   Problem: Pain Managment: Goal: General experience of comfort will improve 07/29/2020 0129 by Tobe Sos, RN Outcome: Progressing 07/29/2020 0128 by Tobe Sos, RN Outcome: Progressing   Problem: Safety: Goal: Ability to remain free from injury will improve 07/29/2020 0129 by Tobe Sos, RN Outcome:  Progressing 07/29/2020 0128 by Tobe Sos, RN Outcome: Progressing   Problem: Skin Integrity: Goal: Risk for impaired skin integrity will decrease 07/29/2020 0129 by Tobe Sos, RN Outcome: Progressing 07/29/2020 0128 by Tobe Sos, RN Outcome: Progressing

## 2020-07-30 ENCOUNTER — Other Ambulatory Visit: Payer: Self-pay

## 2020-07-30 DIAGNOSIS — Z20822 Contact with and (suspected) exposure to covid-19: Secondary | ICD-10-CM | POA: Diagnosis not present

## 2020-07-30 DIAGNOSIS — J45909 Unspecified asthma, uncomplicated: Secondary | ICD-10-CM | POA: Diagnosis not present

## 2020-07-30 DIAGNOSIS — I10 Essential (primary) hypertension: Secondary | ICD-10-CM | POA: Diagnosis not present

## 2020-07-30 DIAGNOSIS — R1084 Generalized abdominal pain: Secondary | ICD-10-CM | POA: Diagnosis not present

## 2020-07-30 DIAGNOSIS — K922 Gastrointestinal hemorrhage, unspecified: Secondary | ICD-10-CM | POA: Diagnosis not present

## 2020-07-30 DIAGNOSIS — K529 Noninfective gastroenteritis and colitis, unspecified: Secondary | ICD-10-CM | POA: Diagnosis not present

## 2020-07-30 LAB — CBC
HCT: 38.9 % (ref 36.0–46.0)
Hemoglobin: 13.2 g/dL (ref 12.0–15.0)
MCH: 32.3 pg (ref 26.0–34.0)
MCHC: 33.9 g/dL (ref 30.0–36.0)
MCV: 95.1 fL (ref 80.0–100.0)
Platelets: 192 10*3/uL (ref 150–400)
RBC: 4.09 MIL/uL (ref 3.87–5.11)
RDW: 14.5 % (ref 11.5–15.5)
WBC: 8.8 10*3/uL (ref 4.0–10.5)
nRBC: 0 % (ref 0.0–0.2)

## 2020-07-30 LAB — BASIC METABOLIC PANEL
Anion gap: 7 (ref 5–15)
BUN: 8 mg/dL (ref 6–20)
CO2: 27 mmol/L (ref 22–32)
Calcium: 9.3 mg/dL (ref 8.9–10.3)
Chloride: 103 mmol/L (ref 98–111)
Creatinine, Ser: 0.77 mg/dL (ref 0.44–1.00)
GFR, Estimated: >60 mL/min (ref >60)
Glucose, Bld: 98 mg/dL (ref 70–99)
Potassium: 4.1 mmol/L (ref 3.5–5.1)
Sodium: 137 mmol/L (ref 135–145)

## 2020-07-30 LAB — TROPONIN I (HIGH SENSITIVITY): Troponin I (High Sensitivity): 2 ng/L (ref <18)

## 2020-07-30 MED ORDER — METRONIDAZOLE 500 MG PO TABS: 500.0000 mg | ORAL_TABLET | Freq: Three times a day (TID) | ORAL | 0 refills | Status: AC

## 2020-07-30 MED ORDER — PANTOPRAZOLE SODIUM 40 MG PO TBEC: DELAYED_RELEASE_TABLET | ORAL | 1 refills | Status: DC

## 2020-07-30 MED ORDER — CIPROFLOXACIN HCL 250 MG PO TABS: 250.0000 mg | ORAL_TABLET | Freq: Two times a day (BID) | ORAL | 0 refills | Status: AC

## 2020-07-30 NOTE — Discharge Instructions (Signed)
Follow with Primary MD [name], MD in 7 days   Get CBC, CMP, 2 view Chest X ray checked  by Primary MD next visit.    Activity: As tolerated with Full fall precautions use walker/cane & assistance as needed   Disposition Home    Diet:  Regular diet   On your next visit with your primary care physician please Get Medicines reviewed and adjusted.   Please request your Prim.MD to go over all Hospital Tests and Procedure/Radiological results at the follow up, please get all Hospital records sent to your Prim MD by signing hospital release before you go home.   If you experience worsening of your admission symptoms, develop shortness of breath, life threatening emergency, suicidal or homicidal thoughts you must seek medical attention immediately by calling 911 or calling your MD immediately  if symptoms less severe.  You Must read complete instructions/literature along with all the possible adverse reactions/side effects for all the Medicines you take and that have been prescribed to you. Take any new Medicines after you have completely understood and accpet all the possible adverse reactions/side effects.   Do not drive, operating heavy machinery, perform activities at heights, swimming or participation in water activities or provide baby sitting services if your were admitted for syncope or siezures until you have seen by Primary MD or a Neurologist and advised to do so again.  Do not drive when taking Pain medications.    Do not take more than prescribed Pain, Sleep and Anxiety Medications  Special Instructions: If you have smoked or chewed Tobacco  in the last 2 yrs please stop smoking, stop any regular Alcohol  and or any Recreational drug use.  Wear Seat belts while driving.   Please note  You were cared for by a hospitalist during your hospital stay. If you have any questions about your discharge medications or the care you received while you were in the hospital after  you are discharged, you can call the unit and asked to speak with the hospitalist on call if the hospitalist that took care of you is not available. Once you are discharged, your primary care physician will handle any further medical issues. Please note that NO REFILLS for any discharge medications will be authorized once you are discharged, as it is imperative that you return to your primary care physician (or establish a relationship with a primary care physician if you do not have one) for your aftercare needs so that they can reassess your need for medications and monitor your lab values.

## 2020-07-30 NOTE — Discharge Summary (Signed)
[name], is a 60 y.o. female  DOB August 17, 1960  MRN [id number].  Admission date:  07/28/2020  Admitting Physician  Provider Default, MD  Discharge Date:  07/30/2020   Primary MD  [name], MD  Recommendations for primary care physician for things to follow:  - please check CBC,CMP   Admission Diagnosis  Enteritis [K52.9] Generalized abdominal pain [R10.84] Abdominal pain [R10.9] Gastrointestinal hemorrhage, unspecified gastrointestinal hemorrhage type [K92.2]   Discharge Diagnosis  Enteritis [K52.9] Generalized abdominal pain [R10.84] Abdominal pain [R10.9] Gastrointestinal hemorrhage, unspecified gastrointestinal hemorrhage type [K92.2]   Principal Problem:   Abdominal pain Active Problems:   Asthma   History of pulmonary embolus (PE)   PFO (patent foramen ovale)   Essential hypertension   Enteritis      Past Medical History:  Diagnosis Date  . Anemia    since bypass  . Arthritis    osteoarthritis  . Asthma    states no asthma attack since 2002  . Back pain   . Breast cancer ([place]) 06/26/16 bx   left breast  . Breast cancer ([place])   . Chronic back pain   . Complication of anesthesia    states takes more than normal to put her to sleep  . Constipation   . Dental bridge present    upper  . Dental crowns present   . DVT of upper extremity (deep vein thrombosis) ([place])   . Fibromyalgia   . Genetic testing 09/19/2016   Ms. Muhl underwent genetic counseling and testing for hereditary cancer syndromes on 08/21/2016. Her results were negative for mutations in all 46 genes analyzed by [facility]'s 46-gene Common Hereditary Cancers Panel. Genes analyzed include: APC, ATM, AXIN2, BARD1, BMPR1A, BRCA1, BRCA2, BRIP1, CDH1, CDKN2A, CHEK2, CTNNA1, DICER1, EPCAM, GREM1, HOXB13, KIT, MEN1, MLH1, MSH2, MSH3, MSH6, MUTYH, NBN,   . Headache(784.0)    migraines  . History of blood  transfusion 06/2005  . History of gallstones   . History of pneumonia   . History of shingles 07/2011  . History of shingles   . HTN (hypertension)   . Itching   . Joint pain   . Muscle weakness   . Neuropathy   . Normal coronary arteries 2003  . Osteoarthropathy   . Personal history of chemotherapy 2018  . Personal history of radiation therapy 2018  . PFO (patent foramen ovale)   . Presence of inferior vena cava filter   . Pulmonary embolism ([place])   . Sleep apnea    used CPAP until after bypass surg.  . Status post gastric bypass for obesity   . Stomach pain   . Stroke [facility]) 12/2002   right-sided weakness  . Urinary incontinence     Past Surgical History:  Procedure Laterality Date  . ABDOMINAL HYSTERECTOMY  11/1997   complete  . ANAL RECTAL MANOMETRY N/A 01/21/2019   Procedure: ANO RECTAL MANOMETRY;  Surgeon: Arta Silence, MD;  Location: [facility];  Service: Endoscopy;  Laterality: N/A;  . ANTERIOR CERVICAL DECOMP/DISCECTOMY FUSION  02/05/2005  C5-6  . APPENDECTOMY  10/22/2008   laparoscopic  . BREAST LUMPECTOMY Left 07/2016  . BREAST LUMPECTOMY WITH RADIOACTIVE SEED AND SENTINEL LYMPH NODE BIOPSY Left 07/26/2016   Procedure: BREAST LUMPECTOMY WITH RADIOACTIVE SEED AND SENTINEL LYMPH NODE BIOPSY;  Surgeon: Erroll Luna, MD;  Location: [place];  Service: General;  Laterality: Left;  . BUNIONECTOMY  05/1980   both feet  . BUNIONECTOMY  08/2011   left foot  . CARDIAC CATHETERIZATION  03/04/2002  . CARPAL TUNNEL RELEASE  06/21/2009   right  . CARPAL TUNNEL RELEASE     left hand  . CARPAL TUNNEL RELEASE  10/03/2011   Procedure: CARPAL TUNNEL RELEASE;  Surgeon: Wynonia Sours, MD;  Location: [place];  Service: Orthopedics;  Laterality: Right;  CARPAL TUNNEL WITH HYPOTHENAR FAT PAD TRANSFER  . CERVICAL SPINE SURGERY  01/2005   titanium plate implanted  . CHOLECYSTECTOMY  1990  . ELBOW SURGERY  08/09/2004   decompression ulnar nerve right elbow   . ENTEROLYSIS  10/22/2008   laparoscopic abd. enterolysis  . GASTRIC ROUX-EN-Y  2009  . HEEL SPUR SURGERY  08/1997   left  . HEMORRHOID SURGERY  03/1993  . LAPAROSCOPIC LYSIS INTESTINAL ADHESIONS  02/14/2000  . NAILBED REPAIR  01/10/2005; 08/2011   exc. matrix bilat. great toe  . OTHER SURGICAL HISTORY  12/1986   pt states that she had surgery to unclog her fallopean tubes  . PORTACATH PLACEMENT N/A 09/24/2016   Procedure: INSERTION PORT-A-CATH WITH [name];  Surgeon: Erroll Luna, MD;  Location: [place];  Service: General;  Laterality: N/A;  . SHOULDER SURGERY     bilat. - (left:  06/2005)  . TONSILLECTOMY  07/1995  . TRIGGER FINGER RELEASE  04/25/2006   decompression A-1 pulley left thumb  . TRIGGER FINGER RELEASE Right 11/11/2018   Procedure: RELEASE TRIGGER FINGER/A-1 PULLEY RIGHT THUMB, RIGHT RING FINGER;  Surgeon: Daryll Brod, MD;  Location: [place];  Service: Orthopedics;  Laterality: Right;  . UTERINE FIBROID SURGERY  12/95, 7/96   x2  . VENA CAVA FILTER PLACEMENT  2009   during Roux-en-Y surg.       History of present illness and  Hospital Course:     Kindly see H&P for history of present illness and admission details, please review complete Labs, Consult reports and Test reports for all details in brief  HPI  from the history and physical done on the day of admission 07/28/2020  HPI: [name] is a 60 y.o. female with medical history significant of gastric bypass surgery done at [facility] in 2009, osteoarthritis, asthma, breast cancer, DVT with previous IVC filter placed currently not on anticoagulation, who presents with rectal bleed abdominal pain and diarrhea.  Patient reported sudden onset of the mid abdominal pain colicky in nature rated as 7 out of 10.  It started last night after she ate some food.  She then developed multiple episodes of diarrhea.  Had one episode of vomiting.  Currently pain has subsided but still having rectal bleed when she goes to  the toilet.  Patient is therefore being admitted for evaluation of this rectal bleed.  CT abdomen pelvis showed what appears to be an enteritis..  ED Course: Vitals are entirely within normal.  Hemoglobin 12.6.  Chemistry appears to be within normal.  Repeat hemoglobin so far stable at above 12 g.  COVID-19 screen is negative.  HIV screen negative.  CT abdomen pelvis showed evidence of enteritis.  Patient being admitted to the hospital for monitoring.  Hospital Course    Abdominal pain/nausea/abdominal pain -This is most likely in the setting of enteritis, possibly related to infectious etiology, versus her history of gastric bypass with stomal gastritis, she has been treated with IV Cipro and Flagyl, as well she was started on IV Protonix, she will be discharged on Cipro and Flagyl to finish 5 days of treatment, and she will be discharged on Protonix as well, she is tolerating good oral intake, she had an episode of epigastric pain earlier today, resolved without intervention . -Patient had an episode of epigastric pain today, did not appear to be cardiac, but I have obtained EKG which was nonacute, and her troponins are negative as well.  Reported rectal bleed: -No recurrence during hospital stay, actually she had no diarrhea or bowel movement during hospital stay, so could not obtain -Patient report she is following with Dr. Paulita Fujita as an outpatient, ports she had a colonoscopy last year with no acute findings.  History of PE: - Remotely. Not on anticoagulation. Due to reported GI bleed we will watch and no anticoagulation.  Essential hypertension: - Confirm and resume home regimen.  Asthma: - No acute exacerbation.    Discharge Condition:  stable   Follow UP   Follow-up Information    Croitoru, Mihai, MD .   Specialty: Cardiology Contact information: [address] [place] [address] [phone]                 Discharge Instructions   and  Discharge Medications     Discharge Instructions    Diet - low sodium heart healthy   Complete by: As directed    Discharge instructions   Complete by: As directed    Follow with Primary MD [name], MD in 7 days   Get CBC, CMP, 2 view Chest X ray checked  by Primary MD next visit.    Activity: As tolerated with Full fall precautions use walker/cane & assistance as needed   Disposition Home    Diet:  Regular diet   On your next visit with your primary care physician please Get Medicines reviewed and adjusted.   Please request your Prim.MD to go over all Hospital Tests and Procedure/Radiological results at the follow up, please get all Hospital records sent to your Prim MD by signing hospital release before you go home.   If you experience worsening of your admission symptoms, develop shortness of breath, life threatening emergency, suicidal or homicidal thoughts you must seek medical attention immediately by calling 911 or calling your MD immediately  if symptoms less severe.  You Must read complete instructions/literature along with all the possible adverse reactions/side effects for all the Medicines you take and that have been prescribed to you. Take any new Medicines after you have completely understood and accpet all the possible adverse reactions/side effects.   Do not drive, operating heavy machinery, perform activities at heights, swimming or participation in water activities or provide baby sitting services if your were admitted for syncope or siezures until you have seen by Primary MD or a Neurologist and advised to do so again.  Do not drive when taking Pain medications.    Do not take more than prescribed Pain, Sleep and Anxiety Medications  Special Instructions: If you have smoked or chewed Tobacco  in the last 2 yrs please stop smoking, stop any regular Alcohol  and or any Recreational drug use.  Wear Seat belts  while driving.   Please  note  You were cared for by a hospitalist during your hospital stay. If you have any questions about your discharge medications or the care you received while you were in the hospital after you are discharged, you can call the unit and asked to speak with the hospitalist on call if the hospitalist that took care of you is not available. Once you are discharged, your primary care physician will handle any further medical issues. Please note that NO REFILLS for any discharge medications will be authorized once you are discharged, as it is imperative that you return to your primary care physician (or establish a relationship with a primary care physician if you do not have one) for your aftercare needs so that they can reassess your need for medications and monitor your lab values.   Increase activity slowly   Complete by: As directed      Allergies as of 07/30/2020      Reactions   Aspirin Other (See Comments)   ESOPHAGITIS   Oxycodone Hcl Diarrhea, Nausea And Vomiting   Propoxyphene N-acetaminophen Diarrhea, Nausea And Vomiting   Tramadol Other (See Comments)   Cause migraines   Adhesive [tape] Rash, Other (See Comments)   Pulls skin off - please use paper tape   Prednisone Rash      Medication List    STOP taking these medications   celecoxib 200 MG capsule Commonly known as: CELEBREX     TAKE these medications   albuterol 108 (90 Base) MCG/ACT inhaler Commonly known as: VENTOLIN HFA Inhale 2 puffs into the lungs every 6 (six) hours as needed for wheezing or shortness of breath.   aspirin EC 81 MG tablet Take 81 mg by mouth daily.   CALCIUM CITRATE +D PO Take 2 tablets by mouth daily. Gummies   ciprofloxacin 250 MG tablet Commonly known as: CIPRO Take 1 tablet (250 mg total) by mouth 2 (two) times daily for 2 days.   ferrous gluconate 324 MG tablet Commonly known as: FERGON Take 1 tablet (324 mg total) by mouth daily with breakfast.   HYDROcodone-acetaminophen 5-325 MG  tablet Commonly known as: NORCO/VICODIN Take 1 tablet by mouth 4 (four) times daily as needed for pain.   hydrOXYzine 10 MG tablet Commonly known as: ATARAX/VISTARIL Take 10 mg by mouth at bedtime.   letrozole 2.5 MG tablet Commonly known as: FEMARA Take 1 tablet (2.5 mg total) by mouth daily.   lisinopril 10 MG tablet Commonly known as: ZESTRIL Take 10 mg by mouth every morning.   metroNIDAZOLE 500 MG tablet Commonly known as: Flagyl Take 1 tablet (500 mg total) by mouth 3 (three) times daily for 2 days.   mirabegron ER 25 MG Tb24 tablet Commonly known as: MYRBETRIQ Take 25 mg by mouth at bedtime.   mometasone 0.1 % cream Commonly known as: ELOCON Apply 1 application topically daily as needed (rash - summer eczema). Apply to arms   multivitamin with minerals Tabs tablet Take 1 tablet by mouth at bedtime.   pantoprazole 40 MG tablet Commonly known as: Protonix Please take 1 tablet oral twice daily for first week, then take 1 tablet oral daily.   ROBAXIN-750 PO Take 750 mg by mouth at bedtime as needed (muscle pain).   silver sulfADIAZINE 1 % cream Commonly known as: SILVADENE Apply 1 application topically 2 (two) times daily as needed (stomach tears).   sodium chloride 0.65 % Soln nasal spray Commonly known as: OCEAN Place 1 spray  into both nostrils at bedtime.   solifenacin 10 MG tablet Commonly known as: VESICARE Take 10 mg by mouth daily.   valACYclovir 500 MG tablet Commonly known as: VALTREX Take 500 mg by mouth See admin instructions. Take 519m daily.  May take an additional 5062min the evening as needed for outbreak. What changed: Another medication with the same name was removed. Continue taking this medication, and follow the directions you see here.   Vitamin D3 1.25 MG (50000 UT) Caps Take 1 capsule by mouth every 14 (fourteen) days. What changed: additional instructions         Diet and Activity recommendation: See Discharge Instructions  above   Consults obtained -  None   Major procedures and Radiology Reports - PLEASE review detailed and final reports for all details, in brief -     CT ABDOMEN PELVIS WO CONTRAST  Result Date: 07/28/2020 CLINICAL DATA:  Abdominal pain, bowel obstruction suspected. EXAM: CT ABDOMEN AND PELVIS WITHOUT CONTRAST TECHNIQUE: Multidetector CT imaging of the abdomen and pelvis was performed following the standard protocol without IV contrast. COMPARISON:  CT abdomen pelvis July 17, 2018 and MRI abdomen August 04, 2018 FINDINGS: Lower chest: No acute abnormality. Normal size heart. No significant pericardial effusion/thickening. Hepatobiliary: No suspicious hepatic lesion. Gallbladder is surgically absent. No biliary ductal dilation. Pancreas: Unremarkable Spleen: Unremarkable Adrenals/Urinary Tract: Adrenal glands are unremarkable. Kidneys are normal, without renal calculi, focal lesion, or hydronephrosis. Bladder is unremarkable. Stomach/Bowel: Postsurgical changes of gastric bypass, with similar positioning of the anastomotic sutures in comparison to prior. There is mild wall thickening of the proximal aspect of the afferent loop on image 12/2 and 15/2. There is no evidence of obstruction. No abnormal small bowel wall thickening or dilation. No suspicious colonic wall thickening or mass like lesions. Vascular/Lymphatic: Aortic atherosclerosis. Infrarenal IVC filter. No enlarged abdominal or pelvic lymph nodes. Reproductive: Status post hysterectomy. No adnexal masses. Other: No abdominopelvic ascites. Musculoskeletal: Multilevel degenerative changes spine. No acute osseous abnormality IMPRESSION: 1. Postsurgical changes of gastric bypass with mild wall thickening of the proximal aspect of the afferent loop, which may represent focal enteritis. No evidence of obstruction. 2. Aortic atherosclerosis. Aortic Atherosclerosis (ICD10-I70.0). Electronically Signed   By: JeDahlia BailiffD   On: 07/28/2020 10:31     Micro Results    Recent Results (from the past 240 hour(s))  Resp Panel by RT-PCR (Flu A&B, Covid) Nasopharyngeal Swab     Status: None   Collection Time: 07/28/20 12:21 PM   Specimen: Nasopharyngeal Swab; Nasopharyngeal(NP) swabs in vial transport medium  Result Value Ref Range Status   SARS Coronavirus 2 by RT PCR NEGATIVE NEGATIVE Final    Comment: (NOTE) SARS-CoV-2 target nucleic acids are NOT DETECTED.  The SARS-CoV-2 RNA is generally detectable in upper respiratory specimens during the acute phase of infection. The lowest concentration of SARS-CoV-2 viral copies this assay can detect is 138 copies/mL. A negative result does not preclude SARS-Cov-2 infection and should not be used as the sole basis for treatment or other patient management decisions. A negative result may occur with  improper specimen collection/handling, submission of specimen other than nasopharyngeal swab, presence of viral mutation(s) within the areas targeted by this assay, and inadequate number of viral copies(<138 copies/mL). A negative result must be combined with clinical observations, patient history, and epidemiological information. The expected result is Negative.  Fact Sheet for Patients:  htEntrepreneurPulse.com.auFact Sheet for Healthcare Providers:  htIncredibleEmployment.beThis test is no t yet  approved or cleared by the [place] and  has been authorized for detection and/or diagnosis of SARS-CoV-2 by FDA under an Emergency Use Authorization (EUA). This EUA will remain  in effect (meaning this test can be used) for the duration of the COVID-19 declaration under Section 564(b)(1) of the Act, 21 U.S.C.section 360bbb-3(b)(1), unless the authorization is terminated  or revoked sooner.       Influenza A by PCR NEGATIVE NEGATIVE Final   Influenza B by PCR NEGATIVE NEGATIVE Final    Comment: (NOTE) The Xpert Xpress SARS-CoV-2/FLU/RSV plus  assay is intended as an aid in the diagnosis of influenza from Nasopharyngeal swab specimens and should not be used as a sole basis for treatment. Nasal washings and aspirates are unacceptable for Xpert Xpress SARS-CoV-2/FLU/RSV testing.  Fact Sheet for Patients: EntrepreneurPulse.com.au  Fact Sheet for Healthcare Providers: IncredibleEmployment.be  This test is not yet approved or cleared by the [place] FDA and has been authorized for detection and/or diagnosis of SARS-CoV-2 by FDA under an Emergency Use Authorization (EUA). This EUA will remain in effect (meaning this test can be used) for the duration of the COVID-19 declaration under Section 564(b)(1) of the Act, 21 U.S.C. section 360bbb-3(b)(1), unless the authorization is terminated or revoked.  Performed at [facility], [address] [place], [name] [address]        Today   Subjective:   [name] today no nausea, no vomiting, she had a brief episode of epigastric pain earlier today resolved with no intervention, she has good oral intake, no bowel movement, no blood red blood per rectum, no melena .   Objective:   Blood pressure 124/70, pulse 63, temperature 98.5 F (36.9 C), temperature source Oral, resp. rate 19, height _0  (1.549 m), weight 91.8 kg, SpO2 97 %.   Intake/Output Summary (Last 24 hours) at 07/30/2020 1043 Last data filed at 07/30/2020 1026 Gross per 24 hour  Intake 940.96 ml  Output -  Net 940.96 ml    Exam Awake Alert, Oriented x 3, No new F.N deficits, Normal affect Symmetrical Chest wall movement, Good air movement bilaterally, CTAB RRR,No Gallops,Rubs or new Murmurs, No Parasternal Heave +ve B.Sounds, Abd Soft, Non tender. No Cyanosis, Clubbing or edema, No new Rash or bruise  Data Review   CBC w Diff:  Lab Results  Component Value Date   WBC 8.8 07/30/2020   HGB 13.2 07/30/2020   HGB 12.2 06/09/2019   HGB 10.2 (L)  04/19/2017   HCT 38.9 07/30/2020   HCT 36.8 06/09/2019   HCT 31.1 (L) 04/19/2017   PLT 192 07/30/2020   PLT 234 06/09/2019   LYMPHOPCT 13 07/28/2020   LYMPHOPCT 27.3 04/19/2017   MONOPCT 8 07/28/2020   MONOPCT 8.6 04/19/2017   EOSPCT 0 07/28/2020   EOSPCT 2.0 04/19/2017   BASOPCT 0 07/28/2020   BASOPCT 0.3 04/19/2017    CMP:  Lab Results  Component Value Date   NA 137 07/30/2020   NA 145 (H) 11/26/2019   NA 138 04/19/2017   K 4.1 07/30/2020   K 4.0 04/19/2017   CL 103 07/30/2020   CO2 27 07/30/2020   CO2 25 04/19/2017   BUN 8 07/30/2020   BUN 15 11/26/2019   BUN 12.0 04/19/2017   CREATININE 0.77 07/30/2020   CREATININE 0.77 11/26/2017   CREATININE 0.7 04/19/2017   PROT 5.8 (L) 07/29/2020   PROT 7.0 11/26/2019   PROT 6.7 04/19/2017   ALBUMIN 3.3 (L) 07/29/2020  ALBUMIN 4.3 11/26/2019   ALBUMIN 3.9 04/19/2017   BILITOT 0.8 07/29/2020   BILITOT 0.3 11/26/2019   BILITOT 0.4 11/26/2017   BILITOT 0.31 04/19/2017   ALKPHOS 57 07/29/2020   ALKPHOS 50 04/19/2017   AST 17 07/29/2020   AST 17 11/26/2017   AST 14 04/19/2017   ALT 16 07/29/2020   ALT 14 11/26/2017   ALT 11 04/19/2017  .   Total Time in preparing paper work, data evaluation and todays exam - 67 minutes  Phillips Climes M.D on 07/30/2020 at 10:43 AM  [facility]   Office  [phone]

## 2020-07-30 NOTE — Plan of Care (Signed)
  Problem: Education: Goal: Knowledge of General Education information will improve Description: Including pain rating scale, medication(s)/side effects and non-pharmacologic comfort measures Outcome: Progressing   Problem: Health Behavior/Discharge Planning: Goal: Ability to manage health-related needs will improve Outcome: Progressing   Problem: Clinical Measurements: Goal: Ability to maintain clinical measurements within normal limits will improve Outcome: Progressing   Problem: Nutrition: Goal: Adequate nutrition will be maintained Outcome: Progressing   Problem: Coping: Goal: Level of anxiety will decrease Outcome: Progressing   Problem: Elimination: Goal: Will not experience complications related to bowel motility Outcome: Progressing Goal: Will not experience complications related to urinary retention Outcome: Progressing   Problem: Pain Managment: Goal: General experience of comfort will improve Outcome: Progressing   Problem: Safety: Goal: Ability to remain free from injury will improve Outcome: Progressing   Problem: Skin Integrity: Goal: Risk for impaired skin integrity will decrease Outcome: Progressing   

## 2020-07-30 NOTE — Progress Notes (Signed)
Pt c/o burning in her chest. Denies chest pain but states discomfort is 9/10 from the burning sensation. Sent chat to Dr. Waldron [facility] who ordered an EKG. See results.

## 2020-07-30 NOTE — Care Management Obs Status (Signed)
[place] NOTIFICATION   Patient Details  Name: [name] MRN: [id number] Date of Birth: 1961-02-11   Medicare Observation Status Notification Given:  Yes    Carles Collet, RN 07/30/2020, 8:11 AM

## 2020-08-01 ENCOUNTER — Ambulatory Visit (INDEPENDENT_AMBULATORY_CARE_PROVIDER_SITE_OTHER): Payer: Medicare Other | Admitting: Bariatrics

## 2020-08-02 ENCOUNTER — Ambulatory Visit (INDEPENDENT_AMBULATORY_CARE_PROVIDER_SITE_OTHER): Payer: Medicare Other | Admitting: Bariatrics

## 2020-08-03 ENCOUNTER — Other Ambulatory Visit (HOSPITAL_BASED_OUTPATIENT_CLINIC_OR_DEPARTMENT_OTHER): Payer: Self-pay | Admitting: Family Medicine

## 2020-08-03 DIAGNOSIS — K529 Noninfective gastroenteritis and colitis, unspecified: Secondary | ICD-10-CM | POA: Diagnosis not present

## 2020-08-03 DIAGNOSIS — M25512 Pain in left shoulder: Secondary | ICD-10-CM | POA: Diagnosis not present

## 2020-08-03 DIAGNOSIS — K5901 Slow transit constipation: Secondary | ICD-10-CM | POA: Diagnosis not present

## 2020-08-04 NOTE — Progress Notes (Signed)
HEMATOLOGY-ONCOLOGY TELEPHONE VISIT PROGRESS NOTE  I connected with [name] on 08/05/2020 at 10:15 AM EDT by telephone and verified that I am speaking with the correct person using two identifiers.  I discussed the limitations, risks, security and privacy concerns of performing an evaluation and management service by telephone and the availability of in person appointments.  I also discussed with [name] that there may be a [name] responsible charge related to this service. [name] expressed understanding and agreed to proceed.   History of Present Illness: [name] is a 60 y.o. female with above-mentioned history of left breast cancer treated with lumpectomy, adjuvant chemotherapy, radiation, and who is currently on antiestrogen therapy with letrozole. Mammogram on 03/17/20 showed no evidence of malignancy bilaterally.She presents over the phone todayfor follow-up.  Oncology History  Malignant neoplasm of lower-outer quadrant of left breast of female, estrogen receptor positive ([place])  06/26/2016 Initial Diagnosis   Left breast biopsy 3:00: IDC with DCIS, grade 3, ER 80%, PR 20%, Ki-67 60%, HER-2 negative ratio 1.18; palpable lump: 1.8 cm lesion, no lymph nodes, T1 cN0 stage I a clinical stage   07/26/2016 Surgery   Left lumpectomy: IDC 1.8 cm, with DCIS, margins negative, 0/2 lymph nodes, ER 80%, PR 20%, HER-2 negative ratio 1.18, Ki-67 60%, T1cN0 stage IA    08/21/2016 Oncotype testing   Oncotype DX recurrence score 51, risk of recurrence 34%   08/21/2016 Genetic Testing   Genetic counseling and testing for hereditary cancer syndromes performed on 08/21/2016. Results are negative for pathogenic mutations in 46 genes analyzed by [facility]'s Common Hereditary Cancers Panel. Results are dated 09/14/2016. Genes tested: APC, ATM, AXIN2, BARD1, BMPR1A, BRCA1, BRCA2, BRIP1, CDH1, CDKN2A, CHEK2, CTNNA1, DICER1, EPCAM, GREM1, HOXB13, KIT, MEN1, MLH1, MSH2, MSH3, MSH6,  MUTYH, NBN, NF1, NTHL1, PALB2, PDGFRA, PMS2, POLD1, POLE, PTEN, RAD50, RAD51C, RAD51D, SDHA, SDHB, SDHC, SDHD, SMAD4, SMARCA4, STK11, TP53, TSC1, TSC2, and VHL.     11/02/2016 - 04/19/2017 Chemotherapy   Dose dense Adriamycin and Cytoxan 4 followed by Taxol weekly 12   06/10/2017 - 07/26/2017 Radiation Therapy   Adjuvant radiation with Dr. Lisbeth Renshaw   08/2017 -  Anti-estrogen oral therapy   Letrozole daily     Observations/Objective:     Assessment Plan:  Malignant neoplasm of lower-outer quadrant of left breast of female, estrogen receptor positive ([place]) 07/26/2016: Left lumpectomy: IDC 1.8 cm, with DCIS, margins negative, 0/2 lymph nodes, ER 80%, PR 20%, HER-2 negative ratio 1.18, Ki-67 60%, T1cN0 stage IA  Oncotype DX score 51: 10 year risk of recurrence greater than 34%, extremely high risk  Treatment Summary: 1. Adjuvant chemotherapy with Adriamycin/Cytoxan x 4, then weekly Taxol x 12completed 04/19/17 2. Adjuvant radiation therapycompleted on 07/26/2017 3. Adjuvant antiestrogen therapystarts 08/28/17 -------------------------------------------------------------------------------------------------------------------------------- Plan: Adj Anti estrogen therapy with Letrozole 2.5 mg daily Letrozoletoxicities:Denies any hot flashes. She does have severemuscle stiffnesswhich lasts all day and does not get better with activity.  Discontinued letrozole to see if her symptoms improved. Pain improved but still has her chronic pains.  Plan to switch her to Anastrozole (she will start in 2 weeks: once her enteritis is better)  Chemo-induced peripheral neuropathy:She stoppedgabapentinbut continues to have neuropathy in her feet and now in her hands as well.  Breast cancer surveillance: 1.Breast exam 07/15/2020: Benign 2.Mammogram: 03/17/2020: Benign breast density category D (MRI recommended because of high density and family history) 3. Breast MRI 08/11/19: Benign Density  Cat C.  Hospitalization: 07/28/2020-07/31/2018: Generalized abdominal pain, enteritis, GI bleeding Still  on more antibiotics and protonix. Abd pain still persists.  RTC in 6 weeks with Phone visit  I discussed the assessment and treatment plan with [name]. [name] was provided an opportunity to ask questions and all were answered. [name] agreed with the plan and demonstrated an understanding of the instructions. [name] was advised to call back or seek an in-person evaluation if the symptoms worsen or if the condition fails to improve as anticipated.   Total time spent: 15 mins including non-face to face time and time spent for planning, charting and coordination of care  Rulon Eisenmenger, MD 08/05/2020    I, Cloyde Reams Dorshimer, am acting as scribe for Nicholas Lose, MD.  I have reviewed the above documentation for accuracy and completeness, and I agree with the above.

## 2020-08-05 ENCOUNTER — Other Ambulatory Visit: Payer: Self-pay | Admitting: Hematology and Oncology

## 2020-08-05 ENCOUNTER — Inpatient Hospital Stay: Payer: Medicare Other | Attending: Hematology and Oncology | Admitting: Hematology and Oncology

## 2020-08-05 ENCOUNTER — Other Ambulatory Visit: Payer: Self-pay

## 2020-08-05 DIAGNOSIS — C50512 Malignant neoplasm of lower-outer quadrant of left female breast: Secondary | ICD-10-CM

## 2020-08-05 DIAGNOSIS — Z170 Estrogen receptor positive status [ER+]: Secondary | ICD-10-CM

## 2020-08-05 MED ORDER — ANASTROZOLE 1 MG PO TABS: 1.0000 mg | ORAL_TABLET | Freq: Every day | ORAL | 0 refills | Status: DC

## 2020-08-05 NOTE — Assessment & Plan Note (Signed)
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 Adjuvant chemotherapy with Adriamycin/Cytoxan x 4, then weekly Taxol x 12completed 04/19/17 2. Adjuvant radiation therapycompleted on 07/26/2017 3. Adjuvant antiestrogen therapystarts 08/28/17 -------------------------------------------------------------------------------------------------------------------------------- Plan: Adj Anti estrogen therapy with Letrozole 2.5 mg daily Letrozoletoxicities:Denies any hot flashes. She does have severemuscle stiffnesswhich lasts all day and does not get better with activity.  Discontinued letrozole to see if her symptoms improved.  Chemo-induced peripheral neuropathy:She stoppedgabapentinbut continues to have neuropathy in her feet and now in her hands as well.  Breast cancer surveillance: 1.Breast exam 07/15/2020: Benign 2.Mammogram: 03/17/2020: Benign breast density category D (MRI recommended because of high density and family history) 3. Breast MRI 08/11/19: Benign Density Cat C.  Hospitalization: 07/28/2020-07/31/2018: Generalized abdominal pain, enteritis, GI bleeding

## 2020-08-08 ENCOUNTER — Telehealth: Payer: Self-pay | Admitting: Hematology and Oncology

## 2020-08-08 NOTE — Telephone Encounter (Signed)
Scheduled per 3/18 los. Called pt and left a msg

## 2020-08-09 ENCOUNTER — Other Ambulatory Visit: Payer: Self-pay

## 2020-08-09 ENCOUNTER — Ambulatory Visit
Admission: RE | Admit: 2020-08-09 | Discharge: 2020-08-09 | Disposition: A | Discharge status: Home / Self Care | Payer: Medicare Other | Source: Ambulatory Visit | Attending: Hematology and Oncology | Admitting: Hematology and Oncology

## 2020-08-09 DIAGNOSIS — C50512 Malignant neoplasm of lower-outer quadrant of left female breast: Secondary | ICD-10-CM

## 2020-08-10 DIAGNOSIS — M961 Postlaminectomy syndrome, not elsewhere classified: Secondary | ICD-10-CM | POA: Diagnosis not present

## 2020-08-15 ENCOUNTER — Other Ambulatory Visit (HOSPITAL_BASED_OUTPATIENT_CLINIC_OR_DEPARTMENT_OTHER): Payer: Self-pay | Admitting: Internal Medicine

## 2020-08-15 ENCOUNTER — Other Ambulatory Visit (INDEPENDENT_AMBULATORY_CARE_PROVIDER_SITE_OTHER): Payer: Self-pay | Admitting: Bariatrics

## 2020-08-15 ENCOUNTER — Ambulatory Visit: Payer: Medicare Other | Attending: Internal Medicine

## 2020-08-15 DIAGNOSIS — Z23 Encounter for immunization: Secondary | ICD-10-CM

## 2020-08-15 DIAGNOSIS — M25512 Pain in left shoulder: Secondary | ICD-10-CM | POA: Diagnosis not present

## 2020-08-15 DIAGNOSIS — E559 Vitamin D deficiency, unspecified: Secondary | ICD-10-CM

## 2020-08-15 NOTE — Telephone Encounter (Signed)
Pt does need refills

## 2020-08-16 ENCOUNTER — Other Ambulatory Visit (HOSPITAL_BASED_OUTPATIENT_CLINIC_OR_DEPARTMENT_OTHER): Payer: Self-pay | Admitting: Physician Assistant

## 2020-08-16 ENCOUNTER — Ambulatory Visit: Payer: Medicare Other

## 2020-08-16 MED FILL — MODERNA COVID-19 VACCINE 10: 100 | 28 days supply | Qty: 0 | Fill #0

## 2020-08-20 ENCOUNTER — Other Ambulatory Visit (HOSPITAL_COMMUNITY): Payer: Self-pay

## 2020-08-20 ENCOUNTER — Other Ambulatory Visit (HOSPITAL_BASED_OUTPATIENT_CLINIC_OR_DEPARTMENT_OTHER): Payer: Self-pay

## 2020-08-20 MED FILL — Lisinopril Tab 10 MG: ORAL | 90 days supply | Qty: 90 | Fill #0 | Status: AC

## 2020-08-20 MED FILL — Cholecalciferol Cap 1.25 MG (50000 Unit): ORAL | 70 days supply | Qty: 5 | Fill #0 | Status: CN

## 2020-08-20 MED FILL — Cholecalciferol Cap 1.25 MG (50000 Unit): ORAL | 70 days supply | Qty: 5 | Fill #0 | Status: AC

## 2020-08-20 MED FILL — Ferrous Gluconate Tab 324 MG (38 MG Elemental Iron): ORAL | 100 days supply | Qty: 100 | Fill #0 | Status: AC

## 2020-08-22 ENCOUNTER — Other Ambulatory Visit (HOSPITAL_BASED_OUTPATIENT_CLINIC_OR_DEPARTMENT_OTHER): Payer: Self-pay

## 2020-08-22 ENCOUNTER — Other Ambulatory Visit (HOSPITAL_COMMUNITY): Payer: Self-pay

## 2020-08-24 ENCOUNTER — Other Ambulatory Visit (HOSPITAL_BASED_OUTPATIENT_CLINIC_OR_DEPARTMENT_OTHER): Payer: Self-pay

## 2020-08-24 MED ORDER — METHOCARBAMOL 750 MG PO TABS
ORAL_TABLET | ORAL | 1 refills | Status: DC
  Filled 2020-08-24: qty 90, 90d supply, fill #0

## 2020-08-24 MED FILL — Albuterol Sulfate Inhal Aero 108 MCG/ACT (90MCG Base Equiv): RESPIRATORY_TRACT | 17 days supply | Qty: 8.5 | Fill #0 | Status: AC

## 2020-08-24 MED FILL — Ergocalciferol Cap 1.25 MG (50000 Unit): ORAL | 30 days supply | Qty: 30 | Fill #0 | Status: CN

## 2020-08-24 MED FILL — Mirabegron Tab ER 24 HR 25 MG: ORAL | 90 days supply | Qty: 90 | Fill #0 | Status: AC

## 2020-08-24 MED FILL — Silver Sulfadiazine Cream 1%: CUTANEOUS | 30 days supply | Qty: 400 | Fill #0 | Status: AC

## 2020-08-25 ENCOUNTER — Other Ambulatory Visit (HOSPITAL_BASED_OUTPATIENT_CLINIC_OR_DEPARTMENT_OTHER): Payer: Self-pay

## 2020-08-25 DIAGNOSIS — M19012 Primary osteoarthritis, left shoulder: Secondary | ICD-10-CM | POA: Diagnosis not present

## 2020-08-28 ENCOUNTER — Ambulatory Visit
Admission: RE | Admit: 2020-08-28 | Discharge: 2020-08-28 | Disposition: A | Discharge status: Home / Self Care | Payer: Medicare Other | Source: Ambulatory Visit | Attending: Hematology and Oncology | Admitting: Hematology and Oncology

## 2020-08-28 DIAGNOSIS — N6489 Other specified disorders of breast: Secondary | ICD-10-CM | POA: Diagnosis not present

## 2020-08-28 MED ORDER — GADOBUTROL 1 MMOL/ML IV SOLN
10.0000 mL | Freq: Once | INTRAVENOUS | Status: AC | PRN
  Administered 2020-08-28: 10 mL via INTRAVENOUS

## 2020-08-29 ENCOUNTER — Telehealth: Payer: Self-pay | Admitting: Hematology and Oncology

## 2020-08-29 NOTE — Telephone Encounter (Signed)
I informed her that the breast MRI did not show any evidence of breast cancer.

## 2020-09-07 ENCOUNTER — Other Ambulatory Visit (HOSPITAL_BASED_OUTPATIENT_CLINIC_OR_DEPARTMENT_OTHER): Payer: Self-pay

## 2020-09-07 MED ORDER — PANTOPRAZOLE SODIUM 40 MG PO TBEC
DELAYED_RELEASE_TABLET | ORAL | 6 refills | Status: DC
  Filled 2020-09-07: qty 60, 30d supply, fill #0
  Filled 2020-12-18: qty 60, 30d supply, fill #1
  Filled 2021-05-22: qty 60, 30d supply, fill #2

## 2020-09-08 ENCOUNTER — Other Ambulatory Visit (HOSPITAL_BASED_OUTPATIENT_CLINIC_OR_DEPARTMENT_OTHER): Payer: Self-pay

## 2020-09-09 ENCOUNTER — Other Ambulatory Visit: Payer: Self-pay | Admitting: Physician Assistant

## 2020-09-09 ENCOUNTER — Other Ambulatory Visit (HOSPITAL_BASED_OUTPATIENT_CLINIC_OR_DEPARTMENT_OTHER): Payer: Self-pay

## 2020-09-09 DIAGNOSIS — Z9884 Bariatric surgery status: Secondary | ICD-10-CM | POA: Diagnosis not present

## 2020-09-09 DIAGNOSIS — R1013 Epigastric pain: Secondary | ICD-10-CM

## 2020-09-09 DIAGNOSIS — K529 Noninfective gastroenteritis and colitis, unspecified: Secondary | ICD-10-CM | POA: Diagnosis not present

## 2020-09-09 DIAGNOSIS — K5900 Constipation, unspecified: Secondary | ICD-10-CM | POA: Diagnosis not present

## 2020-09-09 MED ORDER — SUCRALFATE 1 G PO TABS
ORAL_TABLET | ORAL | 0 refills | Status: DC
  Filled 2020-09-09: qty 90, 30d supply, fill #0

## 2020-09-09 MED ORDER — AMOXICILLIN-POT CLAVULANATE 875-125 MG PO TABS
ORAL_TABLET | ORAL | 0 refills | Status: DC
  Filled 2020-09-09: qty 20, 10d supply, fill #0

## 2020-09-09 MED ORDER — HYDROCODONE-ACETAMINOPHEN 5-325 MG PO TABS
ORAL_TABLET | ORAL | 0 refills | Status: DC
  Filled 2020-09-09: qty 20, 5d supply, fill #0

## 2020-09-15 ENCOUNTER — Other Ambulatory Visit (HOSPITAL_BASED_OUTPATIENT_CLINIC_OR_DEPARTMENT_OTHER): Payer: Self-pay

## 2020-09-16 ENCOUNTER — Other Ambulatory Visit (HOSPITAL_BASED_OUTPATIENT_CLINIC_OR_DEPARTMENT_OTHER): Payer: Self-pay

## 2020-09-18 NOTE — Progress Notes (Signed)
HEMATOLOGY-ONCOLOGY TELEPHONE VISIT PROGRESS NOTE  I connected with [name] on 09/19/2020 at 10:15 AM EDT by telephone and verified that I am speaking with the correct person using two identifiers.  I discussed the limitations, risks, security and privacy concerns of performing an evaluation and management service by telephone and the availability of in person appointments.  I also discussed with [name] that there may be a [name] responsible charge related to this service. [name] expressed understanding and agreed to proceed.   History of Present Illness: [name] is a 60 y.o. female with above-mentioned history of left breast cancer treated with lumpectomy, adjuvant chemotherapy, radiation, and who is currently on antiestrogen therapy with letrozole. Breast MRI on 4/10/22showed no evidence of malignancy bilaterally.She presents over the phone todayfor follow-up.  Oncology History  Malignant neoplasm of lower-outer quadrant of left breast of female, estrogen receptor positive ([place])  06/26/2016 Initial Diagnosis   Left breast biopsy 3:00: IDC with DCIS, grade 3, ER 80%, PR 20%, Ki-67 60%, HER-2 negative ratio 1.18; palpable lump: 1.8 cm lesion, no lymph nodes, T1 cN0 stage I a clinical stage   07/26/2016 Surgery   Left lumpectomy: IDC 1.8 cm, with DCIS, margins negative, 0/2 lymph nodes, ER 80%, PR 20%, HER-2 negative ratio 1.18, Ki-67 60%, T1cN0 stage IA    08/21/2016 Oncotype testing   Oncotype DX recurrence score 51, risk of recurrence 34%   08/21/2016 Genetic Testing   Genetic counseling and testing for hereditary cancer syndromes performed on 08/21/2016. Results are negative for pathogenic mutations in 46 genes analyzed by [facility] Common Hereditary Cancers Panel. Results are dated 09/14/2016. Genes tested: APC, ATM, AXIN2, BARD1, BMPR1A, BRCA1, BRCA2, BRIP1, CDH1, CDKN2A, CHEK2, CTNNA1, DICER1, EPCAM, GREM1, HOXB13, KIT, MEN1, MLH1, MSH2, MSH3, MSH6,  MUTYH, NBN, NF1, NTHL1, PALB2, PDGFRA, PMS2, POLD1, POLE, PTEN, RAD50, RAD51C, RAD51D, SDHA, SDHB, SDHC, SDHD, SMAD4, SMARCA4, STK11, TP53, TSC1, TSC2, and VHL.     11/02/2016 - 04/19/2017 Chemotherapy   Dose dense Adriamycin and Cytoxan 4 followed by Taxol weekly 12   06/10/2017 - 07/26/2017 Radiation Therapy   Adjuvant radiation with Dr. Lisbeth Renshaw   08/2017 -  Anti-estrogen oral therapy   Letrozole daily switched to Anastrozole (due to muscle pains)     Observations/Objective:     Assessment Plan:  Malignant neoplasm of lower-outer quadrant of left breast of female, estrogen receptor positive ([place]) 07/26/2016: Left lumpectomy: IDC 1.8 cm, with DCIS, margins negative, 0/2 lymph nodes, ER 80%, PR 20%, HER-2 negative ratio 1.18, Ki-67 60%, T1cN0 stage IA  Oncotype DX score 51: 10 year risk of recurrence greater than 34%, extremely high risk  Treatment Summary: 1. Adjuvant chemotherapy with Adriamycin/Cytoxan x 4, then weekly Taxol x 12completed 04/19/17 2. Adjuvant radiation therapycompleted on 07/26/2017 3. Adjuvant antiestrogen therapystarts 08/28/17 -------------------------------------------------------------------------------------------------------------------------------- Plan: Adj Anti estrogen therapy with Letrozole 2.5 mg daily switched to Anastrozole   Anastrozoletoxicities:Bone pain better but now having Hip pain.  Shoulder replacement surgery June 1st.  Chemo-induced peripheral neuropathy:She stoppedgabapentinbut continues to have neuropathy in her feet and now in her hands as well.  Breast cancer surveillance: 1.Breast exam 07/15/2020: Benign 2.Mammogram: 03/17/2020: Benign breast density category D (MRI recommended because of high density and family history) 3. Breast MRI 08/29/20: Benign Density Cat C. 4. CT Abd and pelvis 07/28/20: Post surgical changes  Hospitalization: 07/28/2020-07/31/2018: Generalized abdominal pain, enteritis, GI bleeding Abd pain  still persists: Being scheduled for another CT and endoscopy  [name] will call us back after couple of  weeks to inform us about what happened by stopping anastrozole.  If the hip pain is related to osteoarthritis it is not likely to get better by stopping anastrozole.  In that case she will resume anastrozole therapy. If the hip pain got better by stopping anastrozole we can consider switching her to exemestane.  I discussed the assessment and treatment plan with [name]. [name] was provided an opportunity to ask questions and all were answered. [name] agreed with the plan and demonstrated an understanding of the instructions. [name] was advised to call back or seek an in-person evaluation if the symptoms worsen or if the condition fails to improve as anticipated.   Total time spent: 12 mins including non-face to face time and time spent for planning, charting and coordination of care  Rulon Eisenmenger, MD 09/19/2020    I, Cloyde Reams Dorshimer, am acting as scribe for Nicholas Lose, MD.  I have reviewed the above documentation for accuracy and completeness, and I agree with the above.

## 2020-09-18 NOTE — Assessment & Plan Note (Signed)
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 Adjuvant chemotherapy with Adriamycin/Cytoxan x 4, then weekly Taxol x 12completed 04/19/17 2. Adjuvant radiation therapycompleted on 07/26/2017 3. Adjuvant antiestrogen therapystarts 08/28/17 -------------------------------------------------------------------------------------------------------------------------------- Plan: Adj Anti estrogen therapy with Letrozole 2.5 mg daily Letrozoletoxicities:Denies any hot flashes. She does have severemuscle stiffnesswhich lasts all day and does not get better with activity.  Discontinued letrozole to see if her symptoms improved. Pain improved but still has her chronic pains.  Plan to switch her to Anastrozole (she will start in 2 weeks: once her enteritis is better)  Chemo-induced peripheral neuropathy:She stoppedgabapentinbut continues to have neuropathy in her feet and now in her hands as well.  Breast cancer surveillance: 1.Breast exam 07/15/2020: Benign 2.Mammogram: 03/17/2020: Benign breast density category D (MRI recommended because of high density and family history) 3. Breast MRI 08/29/20: Benign Density Cat C. 4. CT Abd and pelvis 07/28/20: Post surgical changes  Hospitalization: 07/28/2020-07/31/2018: Generalized abdominal pain, enteritis, GI bleeding Still on more antibiotics and protonix. Abd pain still persists.

## 2020-09-19 ENCOUNTER — Inpatient Hospital Stay: Payer: Medicare Other | Attending: Hematology and Oncology | Admitting: Hematology and Oncology

## 2020-09-19 DIAGNOSIS — Z170 Estrogen receptor positive status [ER+]: Secondary | ICD-10-CM | POA: Diagnosis not present

## 2020-09-19 DIAGNOSIS — C50512 Malignant neoplasm of lower-outer quadrant of left female breast: Secondary | ICD-10-CM | POA: Diagnosis not present

## 2020-09-21 ENCOUNTER — Telehealth: Payer: Self-pay | Admitting: Hematology and Oncology

## 2020-09-21 NOTE — Telephone Encounter (Signed)
Sch per 5/2 los, Mailed calendar.

## 2020-09-22 ENCOUNTER — Ambulatory Visit
Admission: RE | Admit: 2020-09-22 | Discharge: 2020-09-22 | Disposition: A | Discharge status: Home / Self Care | Payer: Medicare Other | Source: Ambulatory Visit | Attending: Physician Assistant | Admitting: Physician Assistant

## 2020-09-22 ENCOUNTER — Other Ambulatory Visit: Payer: Self-pay

## 2020-09-22 ENCOUNTER — Other Ambulatory Visit: Payer: Self-pay | Admitting: Physician Assistant

## 2020-09-22 DIAGNOSIS — K529 Noninfective gastroenteritis and colitis, unspecified: Secondary | ICD-10-CM

## 2020-09-22 MED ORDER — IOPAMIDOL (ISOVUE-300) INJECTION 61%
100.0000 mL | Freq: Once | INTRAVENOUS | Status: DC | PRN
Start: 2020-09-22 — End: 2020-09-23

## 2020-09-23 ENCOUNTER — Other Ambulatory Visit (HOSPITAL_BASED_OUTPATIENT_CLINIC_OR_DEPARTMENT_OTHER): Payer: Self-pay

## 2020-09-23 DIAGNOSIS — M25512 Pain in left shoulder: Secondary | ICD-10-CM | POA: Diagnosis not present

## 2020-09-23 DIAGNOSIS — I10 Essential (primary) hypertension: Secondary | ICD-10-CM | POA: Diagnosis not present

## 2020-09-23 DIAGNOSIS — J4520 Mild intermittent asthma, uncomplicated: Secondary | ICD-10-CM | POA: Diagnosis not present

## 2020-09-23 DIAGNOSIS — E7800 Pure hypercholesterolemia, unspecified: Secondary | ICD-10-CM | POA: Diagnosis not present

## 2020-09-23 MED ORDER — HYDROCODONE-ACETAMINOPHEN 5-325 MG PO TABS
ORAL_TABLET | ORAL | 0 refills | Status: DC
  Filled 2020-09-23: qty 20, 5d supply, fill #0

## 2020-09-29 ENCOUNTER — Other Ambulatory Visit (HOSPITAL_BASED_OUTPATIENT_CLINIC_OR_DEPARTMENT_OTHER): Payer: Self-pay

## 2020-09-30 ENCOUNTER — Ambulatory Visit
Admission: RE | Admit: 2020-09-30 | Discharge: 2020-09-30 | Disposition: A | Discharge status: Home / Self Care | Payer: Medicare Other | Source: Ambulatory Visit | Attending: Physician Assistant | Admitting: Physician Assistant

## 2020-09-30 DIAGNOSIS — R1013 Epigastric pain: Secondary | ICD-10-CM | POA: Diagnosis not present

## 2020-10-02 ENCOUNTER — Encounter: Payer: Self-pay | Admitting: Hematology and Oncology

## 2020-10-03 ENCOUNTER — Other Ambulatory Visit: Payer: Self-pay

## 2020-10-03 ENCOUNTER — Encounter (INDEPENDENT_AMBULATORY_CARE_PROVIDER_SITE_OTHER): Payer: Self-pay | Admitting: Bariatrics

## 2020-10-03 ENCOUNTER — Other Ambulatory Visit (HOSPITAL_BASED_OUTPATIENT_CLINIC_OR_DEPARTMENT_OTHER): Payer: Self-pay

## 2020-10-03 ENCOUNTER — Ambulatory Visit (INDEPENDENT_AMBULATORY_CARE_PROVIDER_SITE_OTHER): Payer: Medicare Other | Admitting: Bariatrics

## 2020-10-03 VITALS — BP 129/87 | HR 60 | Temp 98.3°F | Ht 61.0 in | Wt 208.0 lb

## 2020-10-03 DIAGNOSIS — I10 Essential (primary) hypertension: Secondary | ICD-10-CM

## 2020-10-03 DIAGNOSIS — R112 Nausea with vomiting, unspecified: Secondary | ICD-10-CM

## 2020-10-03 DIAGNOSIS — R7303 Prediabetes: Secondary | ICD-10-CM

## 2020-10-03 DIAGNOSIS — Z6837 Body mass index (BMI) 37.0-37.9, adult: Secondary | ICD-10-CM

## 2020-10-03 MED ORDER — EXEMESTANE 25 MG PO TABS
25.0000 mg | ORAL_TABLET | Freq: Every day | ORAL | 0 refills | Status: DC
  Filled 2020-10-03: qty 30, 30d supply, fill #0

## 2020-10-04 ENCOUNTER — Other Ambulatory Visit (HOSPITAL_BASED_OUTPATIENT_CLINIC_OR_DEPARTMENT_OTHER): Payer: Self-pay

## 2020-10-04 ENCOUNTER — Encounter (INDEPENDENT_AMBULATORY_CARE_PROVIDER_SITE_OTHER): Payer: Self-pay | Admitting: Bariatrics

## 2020-10-04 NOTE — Progress Notes (Signed)
Chief Complaint:   OBESITY [name] is here to discuss her progress with her obesity treatment plan along with follow-up of her obesity related diagnoses. [name] is on the Category 2 Plan and states she is following her eating plan approximately 0% of the time. [name] states she is not currently exercising.  Today's visit was #: 23 Starting weight: 204 lbs Starting date: 02/18/2018 Today's weight: 208 lbs Today's date: 10/03/2020 Total lbs lost to date: 0 Total lbs lost since last in-office visit: 0  Interim History: [name] is up 9 lbs. She will need a left shoulder replacement and possible knee replacement in the near future.   Subjective:   1. Pre-diabetes [name] has a diagnosis of prediabetes based on her elevated HgA1c and was informed this puts her at greater risk of developing diabetes. She continues to work on diet and exercise to decrease her risk of diabetes. She denies nausea or hypoglycemia. [name] is not on medication.   Lab Results  Component Value Date   HGBA1C 5.7 (H) 11/26/2019   Lab Results  Component Value Date   INSULIN 3.8 11/26/2019   INSULIN 7.0 06/09/2019   INSULIN 4.9 02/18/2018   2. Essential hypertension [name]'s BP is reasonably well controlled.  3. Nausea and vomiting, intractability of vomiting not specified, unspecified vomiting type [name] will see GI next week for assessment and evaluation of symptoms.  Assessment/Plan:   1. Pre-diabetes [name] will continue to work on weight loss, exercise, and decreasing simple carbohydrates to help decrease the risk of diabetes. Increase healthy fats and protein.  2. Essential hypertension [name] is working on healthy weight loss and exercise to improve blood pressure control. We will watch for signs of hypotension as she continues her lifestyle modifications. Continue current treatment plan.  3. Nausea and vomiting, intractability of vomiting not specified, unspecified vomiting  type Pt is to call is if she needs Zofran.  4. Obesity, current BMI 67  [name] is currently in the action stage of change. As such, her goal is to continue with weight loss efforts. She has agreed to practicing portion control and making smarter food choices, such as increasing vegetables and decreasing simple carbohydrates.   Meal plan Intentional eating 09/09/2020 labs reviewed  Exercise goals: Not as active  Behavioral modification strategies: increasing lean protein intake, decreasing simple carbohydrates, increasing vegetables, increasing water intake, decreasing eating out, no skipping meals, meal planning and cooking strategies, keeping healthy foods in the home and planning for success.  [name] has agreed to follow-up with our clinic in 3-5 months. She was informed of the importance of frequent follow-up visits to maximize her success with intensive lifestyle modifications for her multiple health conditions.   Objective:   Blood pressure 129/87, pulse 60, temperature 98.3 F (36.8 C), height 5\' 1"  (1.549 m), weight 208 lb (94.3 kg), SpO2 98 %. Body mass index is 39.3 kg/m.  General: Cooperative, alert, well developed, in no acute distress. Walking with a cane. HEENT: Conjunctivae and lids unremarkable. Cardiovascular: Regular rhythm.  Lungs: Normal work of breathing. Neurologic: No focal deficits.   Lab Results  Component Value Date   CREATININE 0.77 07/30/2020   BUN 8 07/30/2020   NA 137 07/30/2020   K 4.1 07/30/2020   CL 103 07/30/2020   CO2 27 07/30/2020   Lab Results  Component Value Date   ALT 16 07/29/2020   AST 17 07/29/2020   ALKPHOS 57 07/29/2020   BILITOT 0.8 07/29/2020   Lab Results  Component  Value Date   HGBA1C 5.7 (H) 11/26/2019   HGBA1C 5.5 06/09/2019   HGBA1C 5.6 12/17/2018   HGBA1C 5.7 (H) 06/10/2018   HGBA1C 5.8 (H) 02/18/2018   Lab Results  Component Value Date   INSULIN 3.8 11/26/2019   INSULIN 7.0 06/09/2019   INSULIN 4.9  02/18/2018   Lab Results  Component Value Date   TSH 0.44 03/23/2020   Lab Results  Component Value Date   CHOL 215 (A) 03/23/2020   HDL 137 (A) 03/23/2020   LDLCALC 121 03/23/2020   TRIG 71 03/23/2020   CHOLHDL 2.5 11/07/2018   Lab Results  Component Value Date   WBC 8.8 07/30/2020   HGB 13.2 07/30/2020   HCT 38.9 07/30/2020   MCV 95.1 07/30/2020   PLT 192 07/30/2020   No results found for: IRON, TIBC, FERRITIN   Attestation Statements:   Reviewed by clinician on day of visit: allergies, medications, problem list, medical history, surgical history, family history, social history, and previous encounter notes.  Time spent on visit including pre-visit chart review and post-visit care and charting was 20 minutes.   Coral Ceo, CMA, am acting as Location manager for [employer], DO.  I have reviewed the above documentation for accuracy and completeness, and I agree with the above. Jearld Lesch, DO

## 2020-10-05 ENCOUNTER — Other Ambulatory Visit (HOSPITAL_BASED_OUTPATIENT_CLINIC_OR_DEPARTMENT_OTHER): Payer: Self-pay

## 2020-10-10 ENCOUNTER — Telehealth: Payer: Self-pay | Admitting: Hematology and Oncology

## 2020-10-10 NOTE — Telephone Encounter (Signed)
Scheduled appointment per 05/16 schedule message. Patient is aware.

## 2020-10-12 ENCOUNTER — Other Ambulatory Visit: Payer: Self-pay

## 2020-10-12 ENCOUNTER — Encounter (HOSPITAL_COMMUNITY): Payer: Self-pay

## 2020-10-12 ENCOUNTER — Encounter (HOSPITAL_COMMUNITY)
Admission: RE | Admit: 2020-10-12 | Discharge: 2020-10-12 | Disposition: A | Discharge status: Home / Self Care | Payer: Medicare Other | Source: Ambulatory Visit | Attending: Orthopedic Surgery | Admitting: Orthopedic Surgery

## 2020-10-12 DIAGNOSIS — Z01812 Encounter for preprocedural laboratory examination: Secondary | ICD-10-CM | POA: Insufficient documentation

## 2020-10-12 HISTORY — DX: Gastro-esophageal reflux disease without esophagitis: K21.9

## 2020-10-12 HISTORY — DX: Pneumonia, unspecified organism: J18.9

## 2020-10-12 LAB — SURGICAL PCR SCREEN
MRSA, PCR: NEGATIVE
Staphylococcus aureus: NEGATIVE

## 2020-10-12 NOTE — Progress Notes (Addendum)
Anesthesia Review:  PCP:  DR [name] LOV 10/03/20- Angel Brown,DO  Requested LOV note from office of DR [name] - 09/23/20- on chart  Cardiologist : DR Croituri  12/24/2019- LOV  Chest x-ray : EKG : 07/30/20  Echo : 2018  Stress test: Cardiac Cath :  Activity level: can do a flight of stairs slowly per pt  Sleep Study/ CPAP : noen  Fasting Blood Sugar :      / Checks Blood Sugar -- times a day:   Blood Thinner/ Instructions /Last Dose: ASA / Instructions/ Last Dose :  Unable to obtain blood at preop appt.  Labs will be done am of surgery.  Hx of stroke with some residual right sided weakness Difficulty IV stick  PORT

## 2020-10-12 NOTE — Progress Notes (Signed)
DUE TO COVID-19 ONLY ONE VISITOR IS ALLOWED TO COME WITH YOU AND STAY IN THE WAITING ROOM ONLY DURING PRE OP AND PROCEDURE DAY OF SURGERY. THE 1 VISITOR  MAY VISIT WITH YOU AFTER SURGERY IN YOUR PRIVATE ROOM DURING VISITING HOURS ONLY!  YOU NEED TO HAVE A COVID 19 TEST ON__5/31/22 _____ @_______ , THIS TEST MUST BE DONE BEFORE SURGERY,  COVID TESTING SITE [address] [place] [address], IT IS ON THE RIGHT GOING OUT [address] APPROXIMATELY  2 MINUTES PAST [employer] ON THE RIGHT. ONCE YOUR COVID TEST IS COMPLETED,  PLEASE BEGIN THE QUARANTINE INSTRUCTIONS AS OUTLINED IN YOUR HANDOUT.                [name]  10/12/2020   Your procedure is scheduled on:        10/21/20  Report to [facility] Main  Entrance   Report to admitting at    1010 AM     Call this number if you have problems the morning of surgery [phone]    Remember: Do not eat food , candy gum or mints :After Midnight. You may have clear liquids from midnight until   0940AM    CLEAR LIQUID DIET   Foods Allowed                                                                       Coffee and tea, regular and decaf                              Plain Jell-O any favor except red or purple                                            Fruit ices (not with fruit pulp)                                      Iced Popsicles                                     Carbonated beverages, regular and diet                                    Cranberry, grape and apple juices Sports drinks like Gatorade Lightly seasoned clear broth or consume(fat free) Sugar, honey syrup   _____________________________________________________________________    BRUSH YOUR TEETH MORNING OF SURGERY AND RINSE YOUR MOUTH OUT, NO CHEWING GUM CANDY OR MINTS.     Take these medicines the morning of surgery with A SIP OF WATER:      VESICARE, PROTONIX, INHALERS AS USUAL AND BRING  DO NOT TAKE ANY DIABETIC  MEDICATIONS DAY OF YOUR SURGERY  You may not have any metal on your body including hair pins and              piercings  Do not wear jewelry, make-up, lotions, powders or perfumes, deodorant             Do not wear nail polish on your fingernails.  Do not shave  48 hours prior to surgery.              Men may shave face and neck.   Do not bring valuables to the hospital. [place].  Contacts, dentures or bridgework may not be worn into surgery.  Leave suitcase in the car. After surgery it may be brought to your room.     Patients discharged the day of surgery will not be allowed to drive home. IF YOU ARE HAVING SURGERY AND GOING HOME THE SAME DAY, YOU MUST HAVE AN ADULT TO DRIVE YOU HOME AND BE WITH YOU FOR 24 HOURS. YOU MAY GO HOME BY TAXI OR UBER OR ORTHERWISE, BUT AN ADULT MUST ACCOMPANY YOU HOME AND STAY WITH YOU FOR 24 HOURS.  Name and phone number of your driver:  Special Instructions: N/A              Please read over the following fact sheets you were given: _____________________________________________________________________  [facility] - Preparing for Surgery Before surgery, you can play an important role.  Because skin is not sterile, your skin needs to be as free of germs as possible.  You can reduce the number of germs on your skin by washing with CHG (chlorahexidine gluconate) soap before surgery.  CHG is an antiseptic cleaner which kills germs and bonds with the skin to continue killing germs even after washing. Please DO NOT use if you have an allergy to CHG or antibacterial soaps.  If your skin becomes reddened/irritated stop using the CHG and inform your nurse when you arrive at Short Stay. Do not shave (including legs and underarms) for at least 48 hours prior to the first CHG shower.  You may shave your face/neck. Please follow these instructions carefully:  1.  Shower with CHG Soap the night  before surgery and the  morning of Surgery.  2.  If you choose to wash your hair, wash your hair first as usual with your  normal  shampoo.  3.  After you shampoo, rinse your hair and body thoroughly to remove the  shampoo.                           4.  Use CHG as you would any other liquid soap.  You can apply chg directly  to the skin and wash                       Gently with a scrungie or clean washcloth.  5.  Apply the CHG Soap to your body ONLY FROM THE NECK DOWN.   Do not use on face/ open                           Wound or open sores. Avoid contact with eyes, ears mouth and genitals (private parts).  Wash face,  Genitals (private parts) with your normal soap.             6.  Wash thoroughly, paying special attention to the area where your surgery  will be performed.  7.  Thoroughly rinse your body with warm water from the neck down.  8.  DO NOT shower/wash with your normal soap after using and rinsing off  the CHG Soap.                9.  Pat yourself dry with a clean towel.            10.  Wear clean pajamas.            11.  Place clean sheets on your bed the night of your first shower and do not  sleep with pets. Day of Surgery : Do not apply any lotions/deodorants the morning of surgery.  Please wear clean clothes to the hospital/surgery center.  FAILURE TO FOLLOW THESE INSTRUCTIONS MAY RESULT IN THE CANCELLATION OF YOUR SURGERY PATIENT SIGNATURE_________________________________  NURSE SIGNATURE__________________________________  ________________________________________________________________________

## 2020-10-13 NOTE — H&P (Signed)
Patient's anticipated LOS is less than 2 midnights, meeting these requirements: - Younger than 21 - Lives within 1 hour of care - Has a competent adult at home to recover with post-op recover - NO history of  - Chronic pain requiring opiods  - Diabetes  - Coronary Artery Disease  - Heart failure  - Heart attack  - Stroke  - DVT/VTE  - Cardiac arrhythmia  - Respiratory Failure/COPD  - Renal failure  - Anemia  - Advanced Liver disease       [name] is an 60 y.o. female.    Chief Complaint: left shoulder pain  HPI: Pt is a 60 y.o. female complaining of left shoulder pain for multiple years. Pain had continually increased since the beginning. X-rays in the clinic show end-stage arthritic changes of the left shoulder. Pt has tried various conservative treatments which have failed to alleviate their symptoms, including injections and therapy. Various options are discussed with the patient. Risks, benefits and expectations were discussed with the patient. Patient understand the risks, benefits and expectations and wishes to proceed with surgery.   PCP:  [name], MD  D/C Plans: Home  PMH: Past Medical History:  Diagnosis Date  . Arthritis    osteoarthritis  . Asthma    states no asthma attack since 2002  . Back pain   . Breast cancer ([place]) 06/26/16 bx   left breast  . Breast cancer ([place])   . Chronic back pain   . Complication of anesthesia    states takes more than normal to put her to sleep  . Constipation   . Dental bridge present    upper  . Dental crowns present   . DVT of upper extremity (deep vein thrombosis) ([place])   . Fibromyalgia   . Genetic testing 09/19/2016   Ms. Armenti underwent genetic counseling and testing for hereditary cancer syndromes on 08/21/2016. Her results were negative for mutations in all 46 genes analyzed by [facility]'s 46-gene Common Hereditary Cancers Panel. Genes analyzed include: APC, ATM, AXIN2, BARD1, BMPR1A, BRCA1, BRCA2,  BRIP1, CDH1, CDKN2A, CHEK2, CTNNA1, DICER1, EPCAM, GREM1, HOXB13, KIT, MEN1, MLH1, MSH2, MSH3, MSH6, MUTYH, NBN,   . GERD (gastroesophageal reflux disease)   . History of blood transfusion 06/2005  . History of gallstones   . History of pneumonia   . History of shingles 07/2011  . History of shingles   . HTN (hypertension)   . Itching   . Joint pain   . Muscle weakness   . Neuropathy   . Normal coronary arteries 2003  . Osteoarthropathy   . Personal history of chemotherapy 2018  . Personal history of radiation therapy 2018  . PFO (patent foramen ovale)   . Pneumonia    HX OF   . Presence of inferior vena cava filter   . Pulmonary embolism ([place])   . Sleep apnea    NOT SINCE 2009   . Status post gastric bypass for obesity   . Stomach pain   . Stroke [facility]) 12/2002   right-sided weakness  . Urinary incontinence     PSH: Past Surgical History:  Procedure Laterality Date  . ABDOMINAL HYSTERECTOMY  11/1997   complete  . ANAL RECTAL MANOMETRY N/A 01/21/2019   Procedure: ANO RECTAL MANOMETRY;  Surgeon: Arta Silence, MD;  Location: [facility];  Service: Endoscopy;  Laterality: N/A;  . ANTERIOR CERVICAL DECOMP/DISCECTOMY FUSION  02/05/2005   C5-6  . APPENDECTOMY  10/22/2008   laparoscopic  . BREAST  LUMPECTOMY Left 07/2016  . BREAST LUMPECTOMY WITH RADIOACTIVE SEED AND SENTINEL LYMPH NODE BIOPSY Left 07/26/2016   Procedure: BREAST LUMPECTOMY WITH RADIOACTIVE SEED AND SENTINEL LYMPH NODE BIOPSY;  Surgeon: Erroll Luna, MD;  Location: [place];  Service: General;  Laterality: Left;  . BUNIONECTOMY  05/1980   both feet  . BUNIONECTOMY  08/2011   left foot  . CARDIAC CATHETERIZATION  03/04/2002  . CARPAL TUNNEL RELEASE  06/21/2009   right  . CARPAL TUNNEL RELEASE     left hand  . CARPAL TUNNEL RELEASE  10/03/2011   Procedure: CARPAL TUNNEL RELEASE;  Surgeon: Wynonia Sours, MD;  Location: [place];  Service: Orthopedics;  Laterality: Right;  CARPAL TUNNEL  WITH HYPOTHENAR FAT PAD TRANSFER  . CERVICAL SPINE SURGERY  01/2005   titanium plate implanted  . CHOLECYSTECTOMY  1990  . ELBOW SURGERY  08/09/2004   decompression ulnar nerve right elbow  . ENTEROLYSIS  10/22/2008   laparoscopic abd. enterolysis  . GASTRIC ROUX-EN-Y  2009  . HEEL SPUR SURGERY  08/1997   left  . HEMORRHOID SURGERY  03/1993  . LAPAROSCOPIC LYSIS INTESTINAL ADHESIONS  02/14/2000  . NAILBED REPAIR  01/10/2005; 08/2011   exc. matrix bilat. great toe  . OTHER SURGICAL HISTORY  12/1986   pt states that she had surgery to unclog her fallopean tubes  . PORTACATH PLACEMENT N/A 09/24/2016   Procedure: INSERTION PORT-A-CATH WITH [name];  Surgeon: Erroll Luna, MD;  Location: [employer];  Service: General;  Laterality: N/A;  . SHOULDER SURGERY     bilat. - (left:  06/2005)  . TONSILLECTOMY  07/1995  . TRIGGER FINGER RELEASE  04/25/2006   decompression A-1 pulley left thumb  . TRIGGER FINGER RELEASE Right 11/11/2018   Procedure: RELEASE TRIGGER FINGER/A-1 PULLEY RIGHT THUMB, RIGHT RING FINGER;  Surgeon: Daryll Brod, MD;  Location: [place];  Service: Orthopedics;  Laterality: Right;  . UTERINE FIBROID SURGERY  12/95, 7/96   x2  . VENA CAVA FILTER PLACEMENT  2009   during Roux-en-Y surg.    Social History:  reports that she has quit smoking. She has never used smokeless tobacco. She reports that she does not drink alcohol and does not use drugs.  Allergies:  Allergies  Allergen Reactions  . Aspirin Other (See Comments)    ESOPHAGITIS  . Oxycodone Hcl Diarrhea and Nausea And Vomiting  . Propoxyphene N-Acetaminophen Diarrhea and Nausea And Vomiting  . Tramadol Other (See Comments)    Cause migraines  . Adhesive [Tape] Rash and Other (See Comments)    Pulls skin off - please use paper tape  . Prednisone Rash    Medications: No current facility-administered medications for this encounter.   Current Outpatient Medications  Medication Sig Dispense Refill  . albuterol  (VENTOLIN HFA) 108 (90 Base) MCG/ACT inhaler INHALE 2 PUFFS BY MOUTH AS NEEDED EVERY 4 HOURS (Patient taking differently: Inhale 2 puffs into the lungs every 4 (four) hours as needed for shortness of breath or wheezing.) 8.5 g 6  . aspirin EC 81 MG tablet Take 81 mg by mouth daily.    . Calcium Citrate-Vitamin D (CALCIUM CITRATE +D PO) Take 2 tablets by mouth daily. Gummies    . celecoxib (CELEBREX) 200 MG capsule TAKE 1 CAPSULE BY MOUTH ONCE DAILY (Patient taking differently: Take 200 mg by mouth daily.) 30 capsule 2  . docusate sodium (COLACE) 100 MG capsule Take 100 mg by mouth 2 (two) times daily.    Marland Kitchen  ferrous gluconate (FERGON) 324 MG tablet TAKE 1 TABLET (324 MG TOTAL) BY MOUTH DAILY WITH BREAKFAST. (Patient taking differently: Take 324 mg by mouth every other day.) 100 tablet 0  . HYDROcodone-acetaminophen (NORCO/VICODIN) 5-325 MG tablet Take 1 tablet by mouth every 6 hours as needed for 5 days 20 tablet 0  . hydrOXYzine (ATARAX/VISTARIL) 10 MG tablet Take 10 mg by mouth at bedtime.    Marland Kitchen lisinopril (ZESTRIL) 10 MG tablet TAKE 1 TABLET BY MOUTH ONCE DAILY EVERY MORNING (Patient taking differently: Take 10 mg by mouth every morning.) 90 tablet 0  . methocarbamol (ROBAXIN) 750 MG tablet TAKE 1 TABLET BY MOUTH EVERY EVENING (Patient taking differently: Take 750 mg by mouth 2 (two) times daily as needed for muscle spasms.) 90 tablet 1  . mirabegron ER (MYRBETRIQ) 50 MG TB24 tablet Take 50 mg by mouth daily.    . Multiple Vitamins-Minerals (ALIVE ONCE DAILY WOMENS PO) Take 1 tablet by mouth daily.    . pantoprazole (PROTONIX) 40 MG tablet take 1 tablet by mouth twice a day (Patient taking differently: Take 40 mg by mouth 2 (two) times daily.) 60 tablet 6  . silver sulfADIAZINE (SILVADENE) 1 % cream Apply 1 application topically 2 (two) times daily as needed (stomach tears). 50 g 0  . sodium chloride (OCEAN) 0.65 % SOLN nasal spray Place 1 spray into both nostrils at bedtime.    . solifenacin  (VESICARE) 10 MG tablet Take 10 mg by mouth daily.    Loura Pardon Salicylate (BLUE-EMU HEMP EX) Apply 1 application topically daily.    . valACYclovir (VALTREX) 500 MG tablet Take 500 mg by mouth 2 (two) times daily as needed (outbreak).    . Vitamin D, Ergocalciferol, (DRISDOL) 1.25 MG (50000 UNIT) CAPS capsule TAKE 1 CAPSULE BY MOUTH ONCE EVERY 2 WEEKS (Patient taking differently: Take 50,000 Units by mouth every 14 (fourteen) days.) 30 capsule 1  . celecoxib (CELEBREX) 200 MG capsule TAKE 1 CAPSULE BY MOUTH ONCE DAILY (Patient not taking: No sig reported) 30 capsule 2  . Cholecalciferol 1.25 MG (50000 UT) capsule TAKE 1 CAPSULE BY MOUTH EVERY 14 (FOURTEEN) DAYS. (Patient not taking: Reported on 10/05/2020) 5 capsule 0  . Cholecalciferol 1.25 MG (50000 UT) capsule TAKE 1 CAPSULE BY MOUTH EVERY 14 (FOURTEEN) DAYS. (Patient not taking: No sig reported) 5 capsule 0  . COVID-19 mRNA vaccine, Moderna, 100 MCG/0.5ML injection INJECT AS DIRECTED (Patient not taking: No sig reported) .25 mL 0  . COVID-19 mRNA vaccine, Pfizer, 30 MCG/0.3ML injection INJECT AS DIRECTED (Patient not taking: No sig reported) .3 mL 0  . exemestane (AROMASIN) 25 MG tablet Take 1 tablet (25 mg total) by mouth daily after breakfast. 30 tablet 0  . HYDROcodone-acetaminophen (NORCO/VICODIN) 5-325 MG tablet TAKE 1 TABLET BY MOUTH 4 TIMES A DAY AS NEEDED FOR PAIN (Patient not taking: Reported on 10/05/2020) 20 tablet 0  . HYDROcodone-acetaminophen (NORCO/VICODIN) 5-325 MG tablet TAKE 1 TABLET BY MOUTH 4 TIMES A DAY AS NEEDED FOR PAIN (Patient not taking: Reported on 10/05/2020) 20 tablet 0  . HYDROcodone-acetaminophen (NORCO/VICODIN) 5-325 MG tablet Take 1 tablet by mouth 4 times a day as needed for pain (Patient not taking: Reported on 10/05/2020) 20 tablet 0  . influenza vac split quadrivalent PF (FLUARIX) 0.5 ML injection INJECT AS DIRECTED (Patient not taking: No sig reported) .5 mL 0  . lisinopril (ZESTRIL) 10 MG tablet TAKE 1 TABLET  BY MOUTH ONCE DAILY EVERY MORNING 90 tablet 1  . Methocarbamol (ROBAXIN-750 PO)  Take 750 mg by mouth at bedtime as needed (muscle pain). (Patient not taking: Reported on 10/05/2020)    . mirabegron ER (MYRBETRIQ) 25 MG TB24 tablet Take 25 mg by mouth at bedtime. (Patient not taking: Reported on 10/05/2020)    . mirabegron ER (MYRBETRIQ) 25 MG TB24 tablet TAKE 1 TABLET BY MOUTH ONCE DAILY (Patient not taking: Reported on 10/05/2020) 90 tablet 3  . pantoprazole (PROTONIX) 40 MG tablet Please take 1 tablet oral twice daily for first week, then take 1 tablet oral daily. (Patient not taking: Reported on 10/05/2020) 45 tablet 1    Results for orders placed or performed during the hospital encounter of 10/12/20 (from the past 48 hour(s))  Surgical pcr screen     Status: None   Collection Time: 10/12/20 10:40 AM   Specimen: Nasal Mucosa; Nasal Swab  Result Value Ref Range   MRSA, PCR NEGATIVE NEGATIVE   Staphylococcus aureus NEGATIVE NEGATIVE    Comment: (NOTE) The Xpert SA Assay (FDA approved for NASAL specimens in patients 29 years of age and older), is one component of a comprehensive surveillance program. It is not intended to diagnose infection nor to guide or monitor treatment. Performed at [facility], [place] [address] [place], [place] [address]    No results found.  ROS: Pain with rom of the left upper extremity  Physical Exam: Alert and oriented 60 y.o. female in no acute distress Cranial nerves 2-12 intact Cervical spine: full rom with no tenderness, nv intact distally Chest: active breath sounds bilaterally, no wheeze rhonchi or rales Heart: regular rate and rhythm, no murmur Abd: non tender non distended with active bowel sounds Hip is stable with rom  Left shoulder painful and weak rom nv intact distally No rashes or edema distally  Assessment/Plan Assessment: left shoulder cuff arthropathy  Plan:  Patient will undergo a left reverse total  by Dr.  Veverly Fells at [facility] Risks benefits and expectations were discussed with the patient. Patient understand risks, benefits and expectations and wishes to proceed. Preoperative templating of the joint replacement has been completed, documented, and submitted to the Operating Room personnel in order to optimize intra-operative equipment management.   Merla Riches PA-C, MPAS [facility] is now [employer] [address], [place], [place], [place] [address] Phone: [phone] [email or web address] Facebook  [employer]

## 2020-10-17 ENCOUNTER — Encounter: Payer: Self-pay | Admitting: Hematology and Oncology

## 2020-10-18 ENCOUNTER — Other Ambulatory Visit (HOSPITAL_COMMUNITY)
Admission: RE | Admit: 2020-10-18 | Discharge: 2020-10-18 | Disposition: A | Discharge status: Home / Self Care | Payer: Medicare Other | Source: Ambulatory Visit | Attending: Orthopedic Surgery | Admitting: Orthopedic Surgery

## 2020-10-18 DIAGNOSIS — Z01812 Encounter for preprocedural laboratory examination: Secondary | ICD-10-CM | POA: Insufficient documentation

## 2020-10-18 DIAGNOSIS — Z20822 Contact with and (suspected) exposure to covid-19: Secondary | ICD-10-CM | POA: Diagnosis not present

## 2020-10-18 LAB — SARS CORONAVIRUS 2 (TAT 6-24 HRS): SARS Coronavirus 2: NEGATIVE

## 2020-10-21 ENCOUNTER — Other Ambulatory Visit: Payer: Self-pay

## 2020-10-21 ENCOUNTER — Other Ambulatory Visit (HOSPITAL_BASED_OUTPATIENT_CLINIC_OR_DEPARTMENT_OTHER): Payer: Self-pay

## 2020-10-21 ENCOUNTER — Ambulatory Visit (HOSPITAL_COMMUNITY): Payer: Medicare Other | Admitting: Anesthesiology

## 2020-10-21 ENCOUNTER — Ambulatory Visit (HOSPITAL_COMMUNITY): Payer: Medicare Other | Admitting: Physician Assistant

## 2020-10-21 ENCOUNTER — Encounter (HOSPITAL_COMMUNITY): Payer: Self-pay | Admitting: Orthopedic Surgery

## 2020-10-21 ENCOUNTER — Observation Stay (HOSPITAL_COMMUNITY)
Admission: RE | Admit: 2020-10-21 | Discharge: 2020-10-22 | Disposition: A | Discharge status: Home / Self Care | Payer: Medicare Other | Attending: Orthopedic Surgery | Admitting: Orthopedic Surgery

## 2020-10-21 ENCOUNTER — Encounter (HOSPITAL_COMMUNITY)
Admission: RE | Disposition: A | Discharge status: Home / Self Care | Payer: Self-pay | Source: Home / Self Care | Attending: Orthopedic Surgery

## 2020-10-21 ENCOUNTER — Observation Stay (HOSPITAL_COMMUNITY): Payer: Medicare Other

## 2020-10-21 DIAGNOSIS — Z7982 Long term (current) use of aspirin: Secondary | ICD-10-CM | POA: Diagnosis not present

## 2020-10-21 DIAGNOSIS — I10 Essential (primary) hypertension: Secondary | ICD-10-CM | POA: Diagnosis not present

## 2020-10-21 DIAGNOSIS — J45909 Unspecified asthma, uncomplicated: Secondary | ICD-10-CM | POA: Insufficient documentation

## 2020-10-21 DIAGNOSIS — Z87891 Personal history of nicotine dependence: Secondary | ICD-10-CM | POA: Insufficient documentation

## 2020-10-21 DIAGNOSIS — M19012 Primary osteoarthritis, left shoulder: Principal | ICD-10-CM | POA: Insufficient documentation

## 2020-10-21 DIAGNOSIS — M75102 Unspecified rotator cuff tear or rupture of left shoulder, not specified as traumatic: Secondary | ICD-10-CM | POA: Diagnosis not present

## 2020-10-21 DIAGNOSIS — Z853 Personal history of malignant neoplasm of breast: Secondary | ICD-10-CM | POA: Insufficient documentation

## 2020-10-21 DIAGNOSIS — Z96642 Presence of left artificial hip joint: Secondary | ICD-10-CM | POA: Diagnosis not present

## 2020-10-21 DIAGNOSIS — Z79899 Other long term (current) drug therapy: Secondary | ICD-10-CM | POA: Insufficient documentation

## 2020-10-21 DIAGNOSIS — M6289 Other specified disorders of muscle: Secondary | ICD-10-CM | POA: Diagnosis not present

## 2020-10-21 DIAGNOSIS — Z471 Aftercare following joint replacement surgery: Secondary | ICD-10-CM | POA: Diagnosis not present

## 2020-10-21 DIAGNOSIS — Z96612 Presence of left artificial shoulder joint: Secondary | ICD-10-CM

## 2020-10-21 DIAGNOSIS — G8918 Other acute postprocedural pain: Secondary | ICD-10-CM | POA: Diagnosis not present

## 2020-10-21 HISTORY — PX: REVERSE SHOULDER ARTHROPLASTY: SHX5054

## 2020-10-21 LAB — BASIC METABOLIC PANEL
Anion gap: 9 (ref 5–15)
BUN: 19 mg/dL (ref 6–20)
CO2: 27 mmol/L (ref 22–32)
Calcium: 9.5 mg/dL (ref 8.9–10.3)
Chloride: 99 mmol/L (ref 98–111)
Creatinine, Ser: 0.63 mg/dL (ref 0.44–1.00)
GFR, Estimated: >60 mL/min (ref >60)
Glucose, Bld: 90 mg/dL (ref 70–99)
Potassium: 3.7 mmol/L (ref 3.5–5.1)
Sodium: 135 mmol/L (ref 135–145)

## 2020-10-21 LAB — CBC
HCT: 39 % (ref 36.0–46.0)
Hemoglobin: 12.9 g/dL (ref 12.0–15.0)
MCH: 31.7 pg (ref 26.0–34.0)
MCHC: 33.1 g/dL (ref 30.0–36.0)
MCV: 95.8 fL (ref 80.0–100.0)
Platelets: 178 10*3/uL (ref 150–400)
RBC: 4.07 MIL/uL (ref 3.87–5.11)
RDW: 13.7 % (ref 11.5–15.5)
WBC: 4.9 10*3/uL (ref 4.0–10.5)
nRBC: 0 % (ref 0.0–0.2)

## 2020-10-21 SURGERY — ARTHROPLASTY, SHOULDER, TOTAL, REVERSE
Anesthesia: General | Site: Shoulder | Laterality: Left

## 2020-10-21 MED ORDER — CELECOXIB 200 MG PO CAPS: 200.0000 mg | ORAL_CAPSULE | Freq: Every day | ORAL | Status: DC

## 2020-10-21 MED ORDER — ROCURONIUM BROMIDE 10 MG/ML (PF) SYRINGE
PREFILLED_SYRINGE | INTRAVENOUS | Status: AC
  Filled 2020-10-21: qty 10

## 2020-10-21 MED ORDER — BUPIVACAINE LIPOSOME 1.3 % IJ SUSP
INTRAMUSCULAR | Status: DC | PRN
  Administered 2020-10-21: 10 mL via PERINEURAL

## 2020-10-21 MED ORDER — HYDROXYZINE HCL 10 MG PO TABS
10.0000 mg | ORAL_TABLET | Freq: Every evening | ORAL | Status: DC | PRN
  Filled 2020-10-21: qty 1

## 2020-10-21 MED ORDER — HYDROMORPHONE HCL 1 MG/ML IJ SOLN: 0.5000 mg | INTRAMUSCULAR | Status: DC | PRN

## 2020-10-21 MED ORDER — CALCIUM CITRATE-VITAMIN D 315-250 MG-UNIT PO TABS: ORAL_TABLET | Freq: Every day | ORAL | Status: DC

## 2020-10-21 MED ORDER — METHOCARBAMOL 500 MG PO TABS: 750.0000 mg | ORAL_TABLET | Freq: Three times a day (TID) | ORAL | Status: DC | PRN

## 2020-10-21 MED ORDER — ONDANSETRON HCL 4 MG/2ML IJ SOLN
INTRAMUSCULAR | Status: DC | PRN
  Administered 2020-10-21: 4 mg via INTRAVENOUS

## 2020-10-21 MED ORDER — DEXAMETHASONE SODIUM PHOSPHATE 10 MG/ML IJ SOLN
INTRAMUSCULAR | Status: DC | PRN
  Administered 2020-10-21: 8 mg via INTRAVENOUS

## 2020-10-21 MED ORDER — LIDOCAINE 2% (20 MG/ML) 5 ML SYRINGE
INTRAMUSCULAR | Status: DC | PRN
  Administered 2020-10-21: 20 mg via INTRAVENOUS

## 2020-10-21 MED ORDER — MIRABEGRON ER 25 MG PO TB24: 25.0000 mg | ORAL_TABLET | Freq: Every day | ORAL | Status: DC

## 2020-10-21 MED ORDER — MIDAZOLAM HCL 2 MG/2ML IJ SOLN
1.0000 mg | INTRAMUSCULAR | Status: DC
  Filled 2020-10-21: qty 2

## 2020-10-21 MED ORDER — FENTANYL CITRATE (PF) 100 MCG/2ML IJ SOLN
INTRAMUSCULAR | Status: DC | PRN
  Administered 2020-10-21 (×2): 50 ug via INTRAVENOUS

## 2020-10-21 MED ORDER — BUPIVACAINE-EPINEPHRINE (PF) 0.5% -1:200000 IJ SOLN
INTRAMUSCULAR | Status: DC | PRN
  Administered 2020-10-21: 15 mL via PERINEURAL

## 2020-10-21 MED ORDER — PROPOFOL 10 MG/ML IV BOLUS
INTRAVENOUS | Status: AC
  Filled 2020-10-21: qty 20

## 2020-10-21 MED ORDER — FERROUS GLUCONATE 324 (38 FE) MG PO TABS
324.0000 mg | ORAL_TABLET | ORAL | Status: DC
  Filled 2020-10-21: qty 1

## 2020-10-21 MED ORDER — CEFAZOLIN SODIUM-DEXTROSE 2-4 GM/100ML-% IV SOLN
2.0000 g | Freq: Four times a day (QID) | INTRAVENOUS | Status: AC
  Administered 2020-10-21 – 2020-10-22 (×3): 2 g via INTRAVENOUS
  Filled 2020-10-21 (×3): qty 100

## 2020-10-21 MED ORDER — BISACODYL 10 MG RE SUPP: 10.0000 mg | Freq: Every day | RECTAL | Status: DC | PRN

## 2020-10-21 MED ORDER — PHENYLEPHRINE HCL-NACL 10-0.9 MG/250ML-% IV SOLN
INTRAVENOUS | Status: DC | PRN
  Administered 2020-10-21: 15 ug/min via INTRAVENOUS

## 2020-10-21 MED ORDER — LACTATED RINGERS IV SOLN: INTRAVENOUS | Status: DC

## 2020-10-21 MED ORDER — ADULT MULTIVITAMIN W/MINERALS CH: ORAL_TABLET | Freq: Every day | ORAL | Status: DC

## 2020-10-21 MED ORDER — POLYETHYLENE GLYCOL 3350 17 G PO PACK: 17.0000 g | PACK | Freq: Every day | ORAL | Status: DC | PRN

## 2020-10-21 MED ORDER — ROCURONIUM BROMIDE 10 MG/ML (PF) SYRINGE
PREFILLED_SYRINGE | INTRAVENOUS | Status: DC | PRN
  Administered 2020-10-21: 50 mg via INTRAVENOUS

## 2020-10-21 MED ORDER — SUGAMMADEX SODIUM 200 MG/2ML IV SOLN
INTRAVENOUS | Status: DC | PRN
  Administered 2020-10-21: 200 mg via INTRAVENOUS

## 2020-10-21 MED ORDER — METOCLOPRAMIDE HCL 5 MG/ML IJ SOLN
5.0000 mg | Freq: Three times a day (TID) | INTRAMUSCULAR | Status: DC | PRN
Start: 2020-10-21 — End: 2020-10-22

## 2020-10-21 MED ORDER — HYDROMORPHONE HCL 2 MG PO TABS
2.0000 mg | ORAL_TABLET | ORAL | 0 refills | Status: DC | PRN
  Filled 2020-10-21: qty 30, 5d supply, fill #0

## 2020-10-21 MED ORDER — DARIFENACIN HYDROBROMIDE ER 15 MG PO TB24
15.0000 mg | ORAL_TABLET | Freq: Every day | ORAL | Status: DC
  Filled 2020-10-21: qty 1

## 2020-10-21 MED ORDER — TROLAMINE SALICYLATE 10 % EX CREA: TOPICAL_CREAM | CUTANEOUS | Status: DC | PRN

## 2020-10-21 MED ORDER — ONDANSETRON HCL 4 MG/2ML IJ SOLN
INTRAMUSCULAR | Status: AC
  Filled 2020-10-21: qty 2

## 2020-10-21 MED ORDER — FENTANYL CITRATE (PF) 100 MCG/2ML IJ SOLN
50.0000 ug | INTRAMUSCULAR | Status: DC
Start: 2020-10-21 — End: 2020-10-21
  Administered 2020-10-21: 50 ug via INTRAVENOUS
  Administered 2020-10-21: 100 ug via INTRAVENOUS
  Filled 2020-10-21: qty 2

## 2020-10-21 MED ORDER — HYDROCODONE-ACETAMINOPHEN 5-325 MG PO TABS
1.0000 | ORAL_TABLET | ORAL | Status: DC | PRN
Start: 2020-10-21 — End: 2020-10-21

## 2020-10-21 MED ORDER — ALBUTEROL SULFATE (2.5 MG/3ML) 0.083% IN NEBU: 3.0000 mL | INHALATION_SOLUTION | RESPIRATORY_TRACT | Status: DC | PRN

## 2020-10-21 MED ORDER — FENTANYL CITRATE (PF) 100 MCG/2ML IJ SOLN
INTRAMUSCULAR | Status: AC
  Filled 2020-10-21: qty 2

## 2020-10-21 MED ORDER — PROPOFOL 10 MG/ML IV BOLUS
INTRAVENOUS | Status: DC | PRN
  Administered 2020-10-21: 200 mg via INTRAVENOUS

## 2020-10-21 MED ORDER — CHLORHEXIDINE GLUCONATE 0.12 % MT SOLN
15.0000 mL | Freq: Once | OROMUCOSAL | Status: AC
  Administered 2020-10-21: 15 mL via OROMUCOSAL

## 2020-10-21 MED ORDER — STERILE WATER FOR IRRIGATION IR SOLN
Status: DC | PRN
  Administered 2020-10-21: 2000 mL

## 2020-10-21 MED ORDER — LIDOCAINE 2% (20 MG/ML) 5 ML SYRINGE
INTRAMUSCULAR | Status: AC
  Filled 2020-10-21: qty 5

## 2020-10-21 MED ORDER — PANTOPRAZOLE SODIUM 40 MG PO TBEC
40.0000 mg | DELAYED_RELEASE_TABLET | Freq: Two times a day (BID) | ORAL | Status: DC
  Administered 2020-10-21 – 2020-10-22 (×2): 40 mg via ORAL
  Filled 2020-10-21 (×2): qty 1

## 2020-10-21 MED ORDER — PHENOL 1.4 % MT LIQD: 1.0000 | OROMUCOSAL | Status: DC | PRN

## 2020-10-21 MED ORDER — LISINOPRIL 10 MG PO TABS
10.0000 mg | ORAL_TABLET | Freq: Every morning | ORAL | Status: DC
  Administered 2020-10-22: 10 mg via ORAL
  Filled 2020-10-21: qty 1

## 2020-10-21 MED ORDER — CALCIUM CARBONATE-VITAMIN D 500-200 MG-UNIT PO TABS: 2.0000 | ORAL_TABLET | Freq: Every day | ORAL | Status: DC

## 2020-10-21 MED ORDER — 0.9 % SODIUM CHLORIDE (POUR BTL) OPTIME
TOPICAL | Status: DC | PRN
  Administered 2020-10-21: 1000 mL

## 2020-10-21 MED ORDER — DOCUSATE SODIUM 100 MG PO CAPS
100.0000 mg | ORAL_CAPSULE | Freq: Two times a day (BID) | ORAL | Status: DC
  Administered 2020-10-21: 100 mg via ORAL
  Filled 2020-10-21: qty 1

## 2020-10-21 MED ORDER — CEFAZOLIN SODIUM-DEXTROSE 2-4 GM/100ML-% IV SOLN
2.0000 g | INTRAVENOUS | Status: AC
  Administered 2020-10-21: 2 g via INTRAVENOUS
  Filled 2020-10-21: qty 100

## 2020-10-21 MED ORDER — METHOCARBAMOL 750 MG PO TABS
750.0000 mg | ORAL_TABLET | Freq: Three times a day (TID) | ORAL | 1 refills | Status: DC | PRN
  Filled 2020-10-21 – 2020-10-22 (×2): qty 60, 20d supply, fill #0
  Filled 2021-08-24: qty 60, 20d supply, fill #1

## 2020-10-21 MED ORDER — SODIUM CHLORIDE 0.9 % IV SOLN: INTRAVENOUS | Status: DC

## 2020-10-21 MED ORDER — EXEMESTANE 25 MG PO TABS
25.0000 mg | ORAL_TABLET | Freq: Every day | ORAL | Status: DC
  Filled 2020-10-21: qty 1

## 2020-10-21 MED ORDER — BUPIVACAINE-EPINEPHRINE (PF) 0.25% -1:200000 IJ SOLN
INTRAMUSCULAR | Status: AC
  Filled 2020-10-21: qty 30

## 2020-10-21 MED ORDER — ONDANSETRON HCL 4 MG/2ML IJ SOLN: 4.0000 mg | Freq: Four times a day (QID) | INTRAMUSCULAR | Status: DC | PRN

## 2020-10-21 MED ORDER — VALACYCLOVIR HCL 500 MG PO TABS
500.0000 mg | ORAL_TABLET | Freq: Two times a day (BID) | ORAL | Status: DC | PRN
  Filled 2020-10-21: qty 1

## 2020-10-21 MED ORDER — ORAL CARE MOUTH RINSE: 15.0000 mL | Freq: Once | OROMUCOSAL | Status: AC

## 2020-10-21 MED ORDER — SALINE SPRAY 0.65 % NA SOLN: 1.0000 | Freq: Every evening | NASAL | Status: DC | PRN

## 2020-10-21 MED ORDER — BUPIVACAINE-EPINEPHRINE (PF) 0.25% -1:200000 IJ SOLN
INTRAMUSCULAR | Status: DC | PRN
  Administered 2020-10-21: 17 mL

## 2020-10-21 MED ORDER — DOCUSATE SODIUM 100 MG PO CAPS: 100.0000 mg | ORAL_CAPSULE | Freq: Two times a day (BID) | ORAL | Status: DC

## 2020-10-21 MED ORDER — ACETAMINOPHEN 325 MG PO TABS: 325.0000 mg | ORAL_TABLET | Freq: Four times a day (QID) | ORAL | Status: DC | PRN

## 2020-10-21 MED ORDER — MIRABEGRON ER 25 MG PO TB24
50.0000 mg | ORAL_TABLET | Freq: Every day | ORAL | Status: DC
  Filled 2020-10-21: qty 2

## 2020-10-21 MED ORDER — METOCLOPRAMIDE HCL 5 MG PO TABS: 5.0000 mg | ORAL_TABLET | Freq: Three times a day (TID) | ORAL | Status: DC | PRN

## 2020-10-21 MED ORDER — ASPIRIN EC 81 MG PO TBEC
81.0000 mg | DELAYED_RELEASE_TABLET | Freq: Every day | ORAL | Status: DC
  Administered 2020-10-22: 81 mg via ORAL
  Filled 2020-10-21: qty 1

## 2020-10-21 MED ORDER — ONDANSETRON HCL 4 MG PO TABS: 4.0000 mg | ORAL_TABLET | Freq: Four times a day (QID) | ORAL | Status: DC | PRN

## 2020-10-21 MED ORDER — MENTHOL 3 MG MT LOZG: 1.0000 | LOZENGE | OROMUCOSAL | Status: DC | PRN

## 2020-10-21 MED ORDER — PHENYLEPHRINE HCL (PRESSORS) 10 MG/ML IV SOLN
INTRAVENOUS | Status: AC
  Filled 2020-10-21: qty 1

## 2020-10-21 SURGICAL SUPPLY — 75 items
AID PSTN UNV HD RSTRNT DISP (MISCELLANEOUS) ×1
BAG SPEC THK2 15X12 ZIP CLS (MISCELLANEOUS)
BAG ZIPLOCK 12X15 (MISCELLANEOUS) IMPLANT
BIT DRILL 1.6MX128 (BIT) ×1 IMPLANT
BIT DRILL 170X2.5X (BIT) IMPLANT
BIT DRL 170X2.5X (BIT) ×1
BLADE SAG 18X100X1.27 (BLADE) ×2 IMPLANT
CLSR STERI-STRIP ANTIMIC 1/2X4 (GAUZE/BANDAGES/DRESSINGS) ×1 IMPLANT
COVER BACK TABLE 60X90IN (DRAPES) ×2 IMPLANT
COVER SURGICAL LIGHT HANDLE (MISCELLANEOUS) ×2 IMPLANT
COVER WAND RF STERILE (DRAPES) IMPLANT
DECANTER SPIKE VIAL GLASS SM (MISCELLANEOUS) ×2 IMPLANT
DRAPE INCISE IOBAN 66X45 STRL (DRAPES) ×2 IMPLANT
DRAPE ORTHO SPLIT 77X108 STRL (DRAPES) ×4
DRAPE SHEET LG 3/4 BI-LAMINATE (DRAPES) ×2 IMPLANT
DRAPE SURG ORHT 6 SPLT 77X108 (DRAPES) ×2 IMPLANT
DRAPE TOP 10253 STERILE (DRAPES) ×2 IMPLANT
DRAPE U-SHAPE 47X51 STRL (DRAPES) ×2 IMPLANT
DRILL 2.5 (BIT) ×2
DRSG ADAPTIC 3X8 NADH LF (GAUZE/BANDAGES/DRESSINGS) ×2 IMPLANT
DRSG PAD ABDOMINAL 8X10 ST (GAUZE/BANDAGES/DRESSINGS) ×2 IMPLANT
DURAPREP 26ML APPLICATOR (WOUND CARE) ×2 IMPLANT
ELECT BLADE TIP CTD 4 INCH (ELECTRODE) ×2 IMPLANT
ELECT NDL TIP 2.8 STRL (NEEDLE) ×1 IMPLANT
ELECT NEEDLE TIP 2.8 STRL (NEEDLE) ×2 IMPLANT
ELECT REM PT RETURN 15FT ADLT (MISCELLANEOUS) ×2 IMPLANT
EPI LT SZ 1 (Orthopedic Implant) ×2 IMPLANT
EPIPHYSIS LT SZ 1 (Orthopedic Implant) IMPLANT
FACESHIELD WRAPAROUND (MASK) ×2 IMPLANT
FACESHIELD WRAPAROUND OR TEAM (MASK) ×1 IMPLANT
GAUZE SPONGE 4X4 12PLY STRL (GAUZE/BANDAGES/DRESSINGS) ×2 IMPLANT
GLENOSPHERE DELTA XTEND LAT 38 (Miscellaneous) ×1 IMPLANT
GLOVE SURG ENC MOIS LTX SZ8 (GLOVE) ×2 IMPLANT
GLOVE SURG ORTHO LTX SZ7.5 (GLOVE) ×2 IMPLANT
GLOVE SURG UNDER POLY LF SZ7.5 (GLOVE) ×2 IMPLANT
GLOVE SURG UNDER POLY LF SZ8.5 (GLOVE) ×2 IMPLANT
GOWN STRL REUS W/TWL XL LVL3 (GOWN DISPOSABLE) ×4 IMPLANT
KIT BASIN OR (CUSTOM PROCEDURE TRAY) ×2 IMPLANT
KIT TURNOVER KIT A (KITS) ×2 IMPLANT
MANIFOLD NEPTUNE II (INSTRUMENTS) ×2 IMPLANT
METAGLENE DELTA EXTEND (Trauma) IMPLANT
METAGLENE DXTEND (Trauma) ×2 IMPLANT
NDL MAYO CATGUT SZ4 TPR NDL (NEEDLE) IMPLANT
NEEDLE MAYO CATGUT SZ4 (NEEDLE) ×2 IMPLANT
NS IRRIG 1000ML POUR BTL (IV SOLUTION) ×2 IMPLANT
PACK SHOULDER (CUSTOM PROCEDURE TRAY) ×2 IMPLANT
PENCIL SMOKE EVACUATOR (MISCELLANEOUS) IMPLANT
PIN GUIDE 1.2 (PIN) ×1 IMPLANT
PIN GUIDE GLENOPHERE 1.5MX300M (PIN) ×1 IMPLANT
PIN METAGLENE 2.5 (PIN) ×1 IMPLANT
PIN STEINMAN FIXATION KNEE (PIN) ×1 IMPLANT
PROTECTOR NERVE ULNAR (MISCELLANEOUS) ×2 IMPLANT
RESTRAINT HEAD UNIVERSAL NS (MISCELLANEOUS) ×2 IMPLANT
SCREW 4.5X18MM (Screw) ×2 IMPLANT
SCREW 4.5X36MM (Screw) ×1 IMPLANT
SCREW BN 18X4.5XSTRL SHLDR (Screw) IMPLANT
SCREW LOCK DELTA XTEND 4.5X30 (Screw) ×1 IMPLANT
SLING ARM FOAM STRAP LRG (SOFTGOODS) ×1 IMPLANT
SMARTMIX MINI TOWER (MISCELLANEOUS)
SPACER 38 PLUS 3 (Spacer) ×1 IMPLANT
SPONGE LAP 4X18 RFD (DISPOSABLE) IMPLANT
STEM 12 HA (Stem) ×1 IMPLANT
STRIP CLOSURE SKIN 1/2X4 (GAUZE/BANDAGES/DRESSINGS) ×2 IMPLANT
SUCTION FRAZIER HANDLE 10FR (MISCELLANEOUS) ×2
SUCTION TUBE FRAZIER 10FR DISP (MISCELLANEOUS) ×1 IMPLANT
SUT FIBERWIRE #2 38 T-5 BLUE (SUTURE) ×8
SUT MNCRL AB 4-0 PS2 18 (SUTURE) ×2 IMPLANT
SUT VIC AB 0 CT1 36 (SUTURE) ×4 IMPLANT
SUT VIC AB 0 CT2 27 (SUTURE) ×2 IMPLANT
SUT VIC AB 2-0 CT1 27 (SUTURE) ×2
SUT VIC AB 2-0 CT1 TAPERPNT 27 (SUTURE) ×1 IMPLANT
SUTURE FIBERWR #2 38 T-5 BLUE (SUTURE) ×2 IMPLANT
TAPE CLOTH 4X10 WHT NS (GAUZE/BANDAGES/DRESSINGS) ×1 IMPLANT
TOWEL OR 17X26 10 PK STRL BLUE (TOWEL DISPOSABLE) ×2 IMPLANT
TOWER SMARTMIX MINI (MISCELLANEOUS) IMPLANT

## 2020-10-21 NOTE — Anesthesia Preprocedure Evaluation (Signed)
Anesthesia Evaluation  Patient identified by MRN, date of birth, ID band Patient awake    Reviewed: Allergy & Precautions, NPO status , Patient's Chart, lab work & pertinent test results  Airway Mallampati: II  TM Distance: >3 FB Neck ROM: Full    Dental  (+) Dental Advisory Given   Pulmonary asthma , sleep apnea , former smoker,    breath sounds clear to auscultation       Cardiovascular hypertension, + DVT (hx of PE)   Rhythm:Regular Rate:Normal     Neuro/Psych CVA    GI/Hepatic Neg liver ROS, GERD  Medicated,S/p gastric bypass   Endo/Other  negative endocrine ROS  Renal/GU negative Renal ROS     Musculoskeletal  (+) Arthritis , Fibromyalgia -  Abdominal   Peds  Hematology negative hematology ROS (+)   Anesthesia Other Findings   Reproductive/Obstetrics                             Lab Results  Component Value Date   WBC 8.8 07/30/2020   HGB 13.2 07/30/2020   HCT 38.9 07/30/2020   MCV 95.1 07/30/2020   PLT 192 07/30/2020   Lab Results  Component Value Date   CREATININE 0.77 07/30/2020   BUN 8 07/30/2020   NA 137 07/30/2020   K 4.1 07/30/2020   CL 103 07/30/2020   CO2 27 07/30/2020    Anesthesia Physical Anesthesia Plan  ASA: II  Anesthesia Plan: General   Post-op Pain Management:  Regional for Post-op pain   Induction: Intravenous  PONV Risk Score and Plan: 3 and Ondansetron, Dexamethasone, Midazolam and Treatment may vary due to age or medical condition  Airway Management Planned: Oral ETT  Additional Equipment: None  Intra-op Plan:   Post-operative Plan: Extubation in OR  Informed Consent: I have reviewed the patients History and Physical, chart, labs and discussed the procedure including the risks, benefits and alternatives for the proposed anesthesia with the patient or authorized representative who has indicated his/her understanding and acceptance.      Dental advisory given  Plan Discussed with: CRNA  Anesthesia Plan Comments:         Anesthesia Quick Evaluation

## 2020-10-21 NOTE — Plan of Care (Signed)

## 2020-10-21 NOTE — Interval H&P Note (Signed)
History and Physical Interval Note:  10/21/2020 10:27 AM  Alicia Patient  has presented today for surgery, with the diagnosis of Left shoulder osteoarthritis rotator cuff insufficiency.  The various methods of treatment have been discussed with the patient and family. After consideration of risks, benefits and other options for treatment, the patient has consented to  Procedure(s) with comments: REVERSE SHOULDER ARTHROPLASTY (Left) - interscalene block as a surgical intervention.  The patient's history has been reviewed, patient examined, no change in status, stable for surgery.  I have reviewed the patient's chart and labs.  Questions were answered to the patient's satisfaction.     [name]

## 2020-10-21 NOTE — Op Note (Signed)
NAMELAVADA, LANGSAM MEDICAL RECORD NO: [id number] ACCOUNT NO: [id number] DATE OF BIRTH: 13-Jan-1961 FACILITY: [facility] LOCATION: [facility] PHYSICIAN: Doran Heater. 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  PROCEDURE PERFORMED:  Left reverse shoulder replacement with subscapularis repair using DePuy Delta Xtend prosthesis.  ATTENDING SURGEON:  Doran Heater. Veverly Fells, MD.  ASSISTANT:  Charletta Cousin [place], [place], who was scrubbed during the entire procedure, and necessary for satisfactory completion of surgery.  ANESTHESIA:  General anesthesia plus interscalene block was used.  ESTIMATED BLOOD LOSS:  Less than 100 mL,  FLUID PLACEMENT:  1500 mL crystalloid.  Instrument counts were correct.  No complications.  Perioperative antibiotics were given.  INDICATIONS:  The patient is a 60 year old female who presents with a history of worsening left shoulder pain and dysfunction.  The patient has longstanding shoulder arthritis, but has progressed recently with severe shoulder dysfunction, likely  secondary to rotator cuff insufficiency.  Due to declining function and worsening pain and a failure of conservative management, we discussed options and recommended reverse shoulder replacement, which would give her the best chance for restoration of  fixed focal mechanics, elimination of pain and restoration of function and improved quality of life.  The patient agreed with this.  Informed consent obtained.  DESCRIPTION OF PROCEDURE:  After an adequate level of anesthesia was achieved, the patient was positioned in modified beach chair position.  Left shoulder correctly identified and sterilely prepped and draped in the usual manner.  Time-out called,  verifying correct patient, correct site.  We did our  standard deltopectoral approach, starting at the coracoid process extending down to the anterior humerus.  Utilizing a 10 blade scalpel dissection down through subcutaneous tissues using Bovie.  We  identified cephalic vein and took that laterally with the deltoid.  Pectoralis was taken medially.  Conjoined tendon identified and retracted medially.  Deep retractors were placed.  We tenodesed the biceps in situ with 0 Vicryl figure-of-eight suture,  incorporating part of the pectoral tendon.  We then released the subscapularis subperiosteally in a peel manner off the lesser tuberosity.  We tagged for repair at the end with #2 FiberWire suture in a modified Mason-Allen suture technique.  We then  released the inferior capsule progressively externally rotating and delivering the humeral head out of the wound.  We had eburnated bone noted.  We then went ahead and released the biceps tendon and began to release the anterior supraspinatus, which was  still attached, but was thin and nonfunctional.  We released back to the posterior infraspinatus externally rotating and delivering the humeral head out of the wound.  We placed our brown retractor and medial Crego retractor.  We then entered the  proximal humerus with a 6 mm reamer, reaming up to a size 12.  We placed our T-handle 12 mm guide rod and with her version set at 10 degrees of retroversion resected the head.  We then removed excess osteophytes with a rongeur.  Next, we subluxed the  humerus posteriorly and then gained 360 degrees exposure of the glenoid face.  Again, eburnated bone noted.  We removed the biceps stump and the labrum and the capsule.  We had a good protection of the axillary nerve during this portion of the procedure  identified the center point and the low center point of the glenoid, placed our central  guide pin to ream for the metaglene baseplate.  We then drilled our central peg hole after doing peripheral hand reaming, we then impacted  the metaglene baseplate in  good position.  We had good bony support placed a 30 screw inferiorly, a 36 screw superiorly and 18 nonlocked anteriorly.  We locked the superior and inferior screws, had great baseplate security we then selected a 38 standard glenosphere and secured  that to the baseplate.  Next, we did a finger sweep making sure there was no soft tissue caught up in the baseplate glenosphere construct.  We then finished the humeral preparation with 1 left reamer and once we had that done, we trialled with the 12  stem 1 left set on the 0 setting and placed in 10 degrees of retroversion and impacted down into place.  We reduced the shoulder with a 38+3 poly trial and we were happy with that soft tissue balancing.  We had a nice range of motion with no impingement,  no gapping with inferior pole or external rotation conjoined appropriately tight.  We removed the trial components.  We irrigated thoroughly.  I selected the real HA coated press-fit 12 stem with 1 left metaphysis set on this setting, we used impaction  grafting technique with the available bone graft and impacted the humeral stem in position, once again in 10 degrees of retroversion.  At this point, we selected the real 38+3 poly impacted that on the humeral tray and reduced the shoulder, had a nice  little pop as it seated, had excellent stability and no impingement.  We irrigated thoroughly.  We then repaired the subscapularis anatomically back to the lesser tuberosity with drill holes through the bone and FiberWire suture.  We had an anatomic repair of the subscap, did not restrict motion whatsoever.  We then  irrigated again and closed deltopectoral interval with 0 Vicryl suture followed by 2-0 Vicryl for subcutaneous closure and 4-0 Monocryl for skin.  Steri-Strips applied followed by sterile dressing and a shoulder sling.  The patient was transported to the  recovery room in stable condition.   PUS D: 10/21/2020 1:51:24 pm  T: 10/21/2020 2:33:00 pm  JOB: [id number]/ [id number]

## 2020-10-21 NOTE — Progress Notes (Signed)
Assisted Dr. Rob Fitzgerald with left, ultrasound guided, interscalene  block. Side rails up, monitors on throughout procedure. See vital signs in flow sheet. Tolerated Procedure well. 

## 2020-10-21 NOTE — Anesthesia Procedure Notes (Addendum)
Procedure Name: Intubation Date/Time: 10/21/2020 11:56 AM Performed by: Montel Clock, CRNA Pre-anesthesia Checklist: Patient identified, Emergency Drugs available, Suction available, Patient being monitored and Timeout performed Patient Re-evaluated:Patient Re-evaluated prior to induction Oxygen Delivery Method: Circle system utilized Preoxygenation: Pre-oxygenation with 100% oxygen Induction Type: IV induction Laryngoscope Size: Mac and 3 Grade View: Grade II Tube type: Oral Tube size: 7.0 mm Number of attempts: 1 Airway Equipment and Method: Stylet Placement Confirmation: ETT inserted through vocal cords under direct vision,  positive ETCO2 and breath sounds checked- equal and bilateral Secured at: 21 cm Tube secured with: Tape Dental Injury: Teeth and Oropharynx as per pre-operative assessment  Comments: Grade 2 view with downward laryngeal pressure.

## 2020-10-21 NOTE — Anesthesia Procedure Notes (Signed)
Anesthesia Regional Block: Interscalene brachial plexus block   Pre-Anesthetic Checklist: ,, timeout performed, Correct Patient, Correct Site, Correct Laterality, Correct Procedure, Correct Position, site marked, Risks and benefits discussed,  Surgical consent,  Pre-op evaluation,  At surgeon's request and post-op pain management  Laterality: Left  Prep: chloraprep       Needles:  Injection technique: Single-shot  Needle Type: Echogenic Stimulator Needle     Needle Length: 9cm  Needle Gauge: 21     Additional Needles:   Procedures:, nerve stimulator,,, ultrasound used (permanent image in chart),,,,   Nerve Stimulator or Paresthesia:  Response: deltoid, 0.5 mA,   Additional Responses:   Narrative:  Start time: 10/21/2020 11:30 AM End time: 10/21/2020 11:37 AM Injection made incrementally with aspirations every 5 mL.  Performed by: Personally  Anesthesiologist: Suzette Battiest, MD

## 2020-10-21 NOTE — Discharge Instructions (Signed)
Ice to the shoulder constantly.  Keep the incision covered and clean and dry for one week, then ok to get it wet in the shower. ° °Do exercise as instructed several times per day. ° °DO NOT reach behind your back or push up out of a chair with the operative arm. ° °Use a sling while you are up and around for comfort, may remove while seated.  Keep pillow propped behind the operative elbow. ° °Follow up with Dr Dalary Hollar in two weeks in the office, call [phone] for appt °

## 2020-10-21 NOTE — Transfer of Care (Signed)
Immediate Anesthesia Transfer of Care Note  [name]: [name]  Procedure(s) Performed: REVERSE SHOULDER ARTHROPLASTY (Left Shoulder)  [name] Location: PACU  Anesthesia Type:GA combined with regional for post-op pain  Level of Consciousness: drowsy and [name] cooperative  Airway & Oxygen Therapy: [name] Spontanous Breathing and [name] connected to face mask oxygen  Post-op Assessment: Report given to RN and Post -op Vital signs reviewed and stable  Post vital signs: Reviewed and stable  Last Vitals:  Vitals Value Taken Time  BP 163/99 10/21/20 1405  Temp    Pulse 86 10/21/20 1407  Resp 12 10/21/20 1407  SpO2 100 % 10/21/20 1407  Vitals shown include unvalidated device data.  Last Pain:  Vitals:   10/21/20 1142  TempSrc:   PainSc: 0-No pain      Patients Stated Pain Goal: 4 (94/50/38 8828)  Complications: No complications documented.

## 2020-10-21 NOTE — Brief Op Note (Signed)
10/21/2020  1:45 PM  [name]:  [name]  60 y.o. female  PRE-OPERATIVE DIAGNOSIS:  Left shoulder osteoarthritis and rotator cuff insufficiency  POST-OPERATIVE DIAGNOSIS:  Left shoulder osteoarthritis and rotator cuff insufficiency  PROCEDURE:  Procedure(s) with comments: REVERSE SHOULDER ARTHROPLASTY (Left) - interscalene block DePuy Delta Xtend with subscap repair  SURGEON:  Surgeon(s) and Role:    Netta Cedars, MD - Primary  PHYSICIAN ASSISTANT:   ASSISTANTS: [place] Bruns, Pa-C  ANESTHESIA:   regional and general  EBL:  150 mL   BLOOD ADMINISTERED:none  DRAINS: none   LOCAL MEDICATIONS USED:  MARCAINE     SPECIMEN:  No Specimen  DISPOSITION OF SPECIMEN:  N/A  COUNTS:  YES  TOURNIQUET:  * No tourniquets in log *  DICTATION: .Other Dictation: Dictation Number [id number]  PLAN OF CARE: Admit for overnight observation  [name] DISPOSITION:  PACU - hemodynamically stable.   Delay start of Pharmacological VTE agent (>24hrs) due to surgical blood loss or risk of bleeding: not applicable

## 2020-10-22 ENCOUNTER — Other Ambulatory Visit (HOSPITAL_COMMUNITY): Payer: Self-pay

## 2020-10-22 DIAGNOSIS — M75102 Unspecified rotator cuff tear or rupture of left shoulder, not specified as traumatic: Secondary | ICD-10-CM | POA: Diagnosis not present

## 2020-10-22 DIAGNOSIS — I10 Essential (primary) hypertension: Secondary | ICD-10-CM | POA: Diagnosis not present

## 2020-10-22 DIAGNOSIS — Z853 Personal history of malignant neoplasm of breast: Secondary | ICD-10-CM | POA: Diagnosis not present

## 2020-10-22 DIAGNOSIS — Z79899 Other long term (current) drug therapy: Secondary | ICD-10-CM | POA: Diagnosis not present

## 2020-10-22 DIAGNOSIS — M19012 Primary osteoarthritis, left shoulder: Secondary | ICD-10-CM | POA: Diagnosis not present

## 2020-10-22 DIAGNOSIS — Z7982 Long term (current) use of aspirin: Secondary | ICD-10-CM | POA: Diagnosis not present

## 2020-10-22 DIAGNOSIS — J45909 Unspecified asthma, uncomplicated: Secondary | ICD-10-CM | POA: Diagnosis not present

## 2020-10-22 DIAGNOSIS — Z87891 Personal history of nicotine dependence: Secondary | ICD-10-CM | POA: Diagnosis not present

## 2020-10-22 MED ORDER — HYDROMORPHONE HCL 2 MG PO TABS
2.0000 mg | ORAL_TABLET | ORAL | 0 refills | Status: DC | PRN
  Filled 2020-10-22: qty 30, 5d supply, fill #0

## 2020-10-22 NOTE — Progress Notes (Signed)
Pt declined most of her morning meds, wants to take them at home.

## 2020-10-22 NOTE — Progress Notes (Signed)
Orthopedics Progress Note  Subjective: Left shoulder with minimal pain this AM.   Objective:  Vitals:   10/22/20 0208 10/22/20 0613  BP: 129/80 (!) 152/86  Pulse: 68 67  Resp: 16 16  Temp: 98.3 F (36.8 C) 98.5 F (36.9 C)  SpO2: 98% 100%    General: Awake and alert  Musculoskeletal: Left shoulder dressing changed to Aquacel. Incision CDI. Minimal swelling Neurovascularly intact  Lab Results  Component Value Date   WBC 4.9 10/21/2020   HGB 12.2 10/21/2020   HCT 36.0 10/21/2020   MCV 95.8 10/21/2020   PLT 178 10/21/2020       Component Value Date/Time   NA 131 (L) 10/21/2020 1051   NA 145 (H) 11/26/2019 1359   NA 138 04/19/2017 1005   K 7.6 (HH) 10/21/2020 1051   K 4.0 04/19/2017 1005   CL 101 10/21/2020 1051   CO2 27 10/21/2020 1008   CO2 25 04/19/2017 1005   GLUCOSE 89 10/21/2020 1051   GLUCOSE 87 04/19/2017 1005   BUN 24 (H) 10/21/2020 1051   BUN 15 11/26/2019 1359   BUN 12.0 04/19/2017 1005   CREATININE 0.70 10/21/2020 1051   CREATININE 0.77 11/26/2017 1544   CREATININE 0.7 04/19/2017 1005   CALCIUM 9.5 10/21/2020 1008   CALCIUM 8.9 04/19/2017 1005   GFRNONAA >60 10/21/2020 1008   GFRNONAA >60 11/26/2017 1544   GFRAA 112 11/26/2019 1359   GFRAA >60 11/26/2017 1544    No results found for: INR, PROTIME  Assessment/Plan: POD #1 s/p Procedure(s): REVERSE SHOULDER ARTHROPLASTY Home after therapy.  Follow up in two weeks.   Doran Heater. Veverly Fells, MD 10/22/2020 7:54 AM

## 2020-10-22 NOTE — Discharge Summary (Signed)
In most cases prophylactic antibiotics for Dental procdeures after total joint surgery are not necessary.  Exceptions are as follows:  1. History of prior total joint infection  2. Severely immunocompromised (Organ Transplant, cancer chemotherapy, Rheumatoid biologic meds such as [place])  3. Poorly controlled diabetes (A1C &gt; 8.0, blood glucose over 200)  If you have one of these conditions, contact your surgeon for an antibiotic prescription, prior to your dental procedure. Orthopedic Discharge Summary        Physician Discharge Summary  Patient ID: [name] MRN: [id number] DOB/AGE: 1961-02-10 60 y.o.  Admit date: 10/21/2020 Discharge date: 10/22/2020   Procedures:  Procedure(s) (LRB): REVERSE SHOULDER ARTHROPLASTY (Left)  Attending Physician:  Dr. Esmond Plants  Admission Diagnoses:   Left shoulder OA, CR insufficiency  Discharge Diagnoses:  same   Past Medical History:  Diagnosis Date  . Arthritis    osteoarthritis  . Asthma    states no asthma attack since 2002  . Back pain   . Breast cancer ([place]) 06/26/16 bx   left breast  . Breast cancer ([place])   . Chronic back pain   . Complication of anesthesia    states takes more than normal to put her to sleep  . Constipation   . Dental bridge present    upper  . Dental crowns present   . DVT of upper extremity (deep vein thrombosis) ([place])   . Fibromyalgia   . Genetic testing 09/19/2016   Ms. Wrightsman underwent genetic counseling and testing for hereditary cancer syndromes on 08/21/2016. Her results were negative for mutations in all 46 genes analyzed by [facility]'s 46-gene Common Hereditary Cancers Panel. Genes analyzed include: APC, ATM, AXIN2, BARD1, BMPR1A, BRCA1, BRCA2, BRIP1, CDH1, CDKN2A, CHEK2, CTNNA1, DICER1, EPCAM, GREM1, HOXB13, KIT, MEN1, MLH1, MSH2, MSH3, MSH6, MUTYH, NBN,   . GERD (gastroesophageal reflux disease)   . History of blood transfusion 06/2005  . History of gallstones   . History of  pneumonia   . History of shingles 07/2011  . History of shingles   . HTN (hypertension)   . Itching   . Joint pain   . Muscle weakness   . Neuropathy   . Normal coronary arteries 2003  . Osteoarthropathy   . Personal history of chemotherapy 2018  . Personal history of radiation therapy 2018  . PFO (patent foramen ovale)   . Pneumonia    HX OF   . Presence of inferior vena cava filter   . Pulmonary embolism ([place])   . Sleep apnea    NOT SINCE 2009   . Status post gastric bypass for obesity   . Stomach pain   . Stroke [facility]) 12/2002   right-sided weakness  . Urinary incontinence     PCP: [name], MD   Discharged Condition: good  Hospital Course:  Patient underwent the above stated procedure on 10/21/2020. Patient tolerated the procedure well and brought to the recovery room in good condition and subsequently to the floor. Patient had an uncomplicated hospital course and was stable for discharge.   Disposition: Discharge disposition: 01-Home or Self Care      with follow up in 2 weeks    Follow-up Information    Netta Cedars, MD. Call in 2 weeks.   Specialty: Orthopedic Surgery Why: [phone] Contact information: [address]  [place] [address] [phone]               Dental Antibiotics:  In most cases prophylactic antibiotics for  Dental procdeures after total joint surgery are not necessary.  Exceptions are as follows:  1. History of prior total joint infection  2. Severely immunocompromised (Organ Transplant, cancer chemotherapy, Rheumatoid biologic meds such as [place])  3. Poorly controlled diabetes (A1C &gt; 8.0, blood glucose over 200)  If you have one of these conditions, contact your surgeon for an antibiotic prescription, prior to your dental procedure.  Discharge Instructions    Call MD / Call 911   Complete by: As directed    If you experience chest pain or shortness of breath, CALL 911 and be transported  to the hospital emergency room.  If you develope a fever above 101 F, pus (white drainage) or increased drainage or redness at the wound, or calf pain, call your surgeon's office.   Constipation Prevention   Complete by: As directed    Drink plenty of fluids.  Prune juice may be helpful.  You may use a stool softener, such as Colace (over the counter) 100 mg twice a day.  Use MiraLax (over the counter) for constipation as needed.   Diet - low sodium heart healthy   Complete by: As directed    Increase activity slowly as tolerated   Complete by: As directed    Post-operative opioid taper instructions:   Complete by: As directed    POST-OPERATIVE OPIOID TAPER INSTRUCTIONS: It is important to wean off of your opioid medication as soon as possible. If you do not need pain medication after your surgery it is ok to stop day one. Opioids include: Codeine, Hydrocodone(Norco, Vicodin), Oxycodone(Percocet, oxycontin) and hydromorphone amongst others.  Long term and even short term use of opiods can cause: Increased pain response Dependence Constipation Depression Respiratory depression And more.  Withdrawal symptoms can include Flu like symptoms Nausea, vomiting And more Techniques to manage these symptoms Hydrate well Eat regular healthy meals Stay active Use relaxation techniques(deep breathing, meditating, yoga) Do Not substitute Alcohol to help with tapering If you have been on opioids for less than two weeks and do not have pain than it is ok to stop all together.  Plan to wean off of opioids This plan should start within one week post op of your joint replacement. Maintain the same interval or time between taking each dose and first decrease the dose.  Cut the total daily intake of opioids by one tablet each day Next start to increase the time between doses. The last dose that should be eliminated is the evening dose.         Allergies as of 10/22/2020      Reactions   Aspirin  Other (See Comments)   ESOPHAGITIS   Oxycodone Hcl Diarrhea, Nausea And Vomiting   Propoxyphene N-acetaminophen Diarrhea, Nausea And Vomiting   Tramadol Other (See Comments)   Cause migraines   Adhesive [tape] Rash, Other (See Comments)   Pulls skin off - please use paper tape   Prednisone Rash      Medication List    STOP taking these medications   Fluarix Quadrivalent 0.5 ML injection Generic drug: influenza vac split quadrivalent PF   Moderna COVID-19 Vaccine 100 MCG/0.5ML injection Generic drug: COVID-19 mRNA vaccine (Moderna)   Pfizer-BioNTech COVID-19 Vacc 30 MCG/0.3ML injection Generic drug: COVID-19 mRNA vaccine (Pfizer)     TAKE these medications   albuterol 108 (90 Base) MCG/ACT inhaler Commonly known as: VENTOLIN HFA INHALE 2 PUFFS BY MOUTH AS NEEDED EVERY 4 HOURS What changed:   how much to take  how  to take this  when to take this  reasons to take this   ALIVE ONCE DAILY WOMENS PO Take 1 tablet by mouth daily.   aspirin EC 81 MG tablet Take 81 mg by mouth daily.   BLUE-EMU HEMP EX Apply 1 application topically daily.   CALCIUM CITRATE +D PO Take 2 tablets by mouth daily. Gummies   celecoxib 200 MG capsule Commonly known as: CELEBREX TAKE 1 CAPSULE BY MOUTH ONCE DAILY What changed: Another medication with the same name was removed. Continue taking this medication, and follow the directions you see here.   D3-50 1.25 MG (50000 UT) capsule Generic drug: Cholecalciferol TAKE 1 CAPSULE BY MOUTH EVERY 14 (FOURTEEN) DAYS. What changed: Another medication with the same name was removed. Continue taking this medication, and follow the directions you see here.   docusate sodium 100 MG capsule Commonly known as: COLACE Take 100 mg by mouth 2 (two) times daily.   exemestane 25 MG tablet Commonly known as: AROMASIN Take 1 tablet (25 mg total) by mouth daily after breakfast.   ferrous gluconate 324 MG tablet Commonly known as: FERGON TAKE 1 TABLET  (324 MG TOTAL) BY MOUTH DAILY WITH BREAKFAST. What changed:   how much to take  how to take this  when to take this   HYDROcodone-acetaminophen 5-325 MG tablet Commonly known as: NORCO/VICODIN TAKE 1 TABLET BY MOUTH 4 TIMES A DAY AS NEEDED FOR PAIN What changed: Another medication with the same name was removed. Continue taking this medication, and follow the directions you see here.   HYDROcodone-acetaminophen 5-325 MG tablet Commonly known as: NORCO/VICODIN Take 1 tablet by mouth every 6 hours as needed for 5 days What changed: Another medication with the same name was removed. Continue taking this medication, and follow the directions you see here.   HYDROmorphone 2 MG tablet Commonly known as: Dilaudid Take 1 tablet (2 mg total) by mouth every 4 (four) hours as needed for moderate pain or severe pain. Do not take with other narcotics like hydrocodone   hydrOXYzine 10 MG tablet Commonly known as: ATARAX/VISTARIL Take 10 mg by mouth at bedtime.   lisinopril 10 MG tablet Commonly known as: ZESTRIL TAKE 1 TABLET BY MOUTH ONCE DAILY EVERY MORNING What changed:   how much to take  Another medication with the same name was removed. Continue taking this medication, and follow the directions you see here.   mirabegron ER 25 MG Tb24 tablet Commonly known as: MYRBETRIQ Take 25 mg by mouth at bedtime. What changed: Another medication with the same name was removed. Continue taking this medication, and follow the directions you see here.   mirabegron ER 50 MG Tb24 tablet Commonly known as: MYRBETRIQ Take 50 mg by mouth daily. What changed: Another medication with the same name was removed. Continue taking this medication, and follow the directions you see here.   pantoprazole 40 MG tablet Commonly known as: Protonix Please take 1 tablet oral twice daily for first week, then take 1 tablet oral daily. What changed: Another medication with the same name was changed. Make sure you  understand how and when to take each.   pantoprazole 40 MG tablet Commonly known as: PROTONIX take 1 tablet by mouth twice a day What changed:   how much to take  when to take this   ROBAXIN-750 PO Take 750 mg by mouth at bedtime as needed (muscle pain). What changed: Another medication with the same name was changed. Make sure you understand how  and when to take each.   methocarbamol 750 MG tablet Commonly known as: ROBAXIN Take 1 tablet (750 mg total) by mouth every 8 (eight) hours as needed for muscle spasms. What changed:   how much to take  how to take this  when to take this  reasons to take this   silver sulfADIAZINE 1 % cream Commonly known as: SILVADENE Apply 1 application topically 2 (two) times daily as needed (stomach tears).   sodium chloride 0.65 % Soln nasal spray Commonly known as: OCEAN Place 1 spray into both nostrils at bedtime.   solifenacin 10 MG tablet Commonly known as: VESICARE Take 10 mg by mouth daily.   valACYclovir 500 MG tablet Commonly known as: VALTREX Take 500 mg by mouth 2 (two) times daily as needed (outbreak).   Vitamin D (Ergocalciferol) 1.25 MG (50000 UNIT) Caps capsule Commonly known as: DRISDOL TAKE 1 CAPSULE BY MOUTH ONCE EVERY 2 WEEKS What changed:   how much to take  how to take this  when to take this         Signed: Augustin Schooling 10/22/2020, 7:58 AM  [place] is now [employer] [facility] [address] [place], [place], [place] [address] Phone: [facility]

## 2020-10-22 NOTE — Evaluation (Signed)
Occupational Therapy Evaluation Patient Details Name: [name] MRN: [id number] DOB: 11-Feb-1961 Today's Date: 10/22/2020    History of Present Illness Patient s/p L rTSA with subscapularis repair.   Clinical Impression   [name] is a 60 year old woman s/p reverse shoulder replacement without functional use of left non-dominant upper extremity secondary to effects of surgery, interscalene block, and shoulder precautions. Therapist provided education and instruction to patient in regards to exercises, precautions, positioning, donning upper extremity clothing and bathing while maintaining shoulder precautions, ice and edema management and donning/doffing sling. Patient verbalized understanding and demonstrated tasks as needed. Patient needed assistance to donn shirt,button pants, and don socks and shoes.Therapist provided education on compensatory strategies to perform ADLs. Patient to follow up with MD for further therapy needs.      Follow Up Recommendations  No OT follow up;Follow surgeon's recommendation for DC plan and follow-up therapies    Equipment Recommendations  Tub/shower seat    Recommendations for Other Services       Precautions / Restrictions Precautions Precautions: Shoulder Type of Shoulder Precautions: No AROM, No PROM, okay for elbow wrist and hand exercises Shoulder Interventions: Shoulder sling/immobilizer;At all times;Off for dressing/bathing/exercises Precaution Booklet Issued:  (handouts) Restrictions Weight Bearing Restrictions: Yes LUE Weight Bearing: Non weight bearing      Mobility Bed Mobility Overal bed mobility: Modified Independent                  Transfers Overall transfer level: Modified independent                    Balance Overall balance assessment: Mild deficits observed, not formally tested                                         ADL either performed or assessed with clinical  judgement   ADL Overall ADL's : Needs assistance/impaired Eating/Feeding: Set up   Grooming: Modified independent   Upper Body Bathing: Modified independent   Lower Body Bathing: Modified independent   Upper Body Dressing : Maximal assistance;Sitting Upper Body Dressing Details (indicate cue type and reason): max assis today for UB dressing due to effects of block and shoulder precautions. Educated on compensatory strategy Lower Body Dressing: Moderate assistance Lower Body Dressing Details (indicate cue type and reason): assistance to don socks and shoes and button jeands. Patient able to pull up clothing. Educated on compensatory strategies - predominantly types of clothing to wear Toilet Transfer: Contractor- Water quality scientist and Hygiene: Supervision/safety;Sit to/from Nurse, children's Details (indicate cue type and reason): discussed use of shower chair for safety         Vision Baseline Vision/History: Wears glasses Wears Glasses: At all times       Perception     Praxis      Pertinent Vitals/Pain Pain Assessment: No/denies pain     Hand Dominance Right   Extremity/Trunk Assessment Upper Extremity Assessment Upper Extremity Assessment: LUE deficits/detail LUE Deficits / Details: Minimal active ROM of LUE secondary to interscalene block LUE Sensation: decreased light touch LUE Coordination: decreased fine motor   Lower Extremity Assessment Lower Extremity Assessment: Overall WFL for tasks assessed   Cervical / Trunk Assessment Cervical / Trunk Assessment: Normal   Communication Communication Communication: No difficulties   Cognition Arousal/Alertness: Awake/alert Behavior During Therapy: WFL for tasks  assessed/performed Overall Cognitive Status: Within Functional Limits for tasks assessed                                     General Comments       Exercises     Shoulder Instructions  Shoulder Instructions Donning/doffing shirt without moving shoulder: Patient able to independently direct caregiver Method for sponge bathing under operated UE: Patient able to independently direct caregiver Donning/doffing sling/immobilizer: Patient able to independently direct caregiver Correct positioning of sling/immobilizer: Independent ROM for elbow, wrist and digits of operated UE: Independent Sling wearing schedule (on at all times/off for ADL's): Independent Proper positioning of operated UE when showering: Independent Dressing change: Independent Positioning of UE while sleeping: [place] expects to be discharged to:: Private residence Living Arrangements: Other relatives (sister\) Available Help at Discharge: Family Type of Home: House Home Access: Stairs to enter Technical brewer of Steps: 1   Home Layout: One level     Bathroom Shower/Tub: Teacher, early years/pre: [place]: Cane - single point          Prior Functioning/Environment Level of Independence: Independent                 OT Problem List: Decreased strength;Decreased range of motion;Pain;Impaired UE functional use      OT Treatment/Interventions:      OT Goals(Current goals can be found in the care plan section) Acute Rehab OT Goals OT Goal Formulation: All assessment and education complete, DC therapy  OT Frequency:     Barriers to D/C:            Co-evaluation              AM-PAC OT "6 Clicks" Daily Activity     Outcome Measure Help from another person eating meals?: A Little Help from another person taking care of personal grooming?: None Help from another person toileting, which includes using toliet, bedpan, or urinal?: None Help from another person bathing (including washing, rinsing, drying)?: None Help from another person to put on and taking off regular upper body clothing?: A Lot Help from another  person to put on and taking off regular lower body clothing?: A Lot 6 Click Score: 19   End of Session Nurse Communication: Mobility status  Activity Tolerance: Patient tolerated treatment well Patient left: in chair;with call bell/phone within reach  OT Visit Diagnosis: Pain;Muscle weakness (generalized) (M62.81)                Time: 1443-1540 OT Time Calculation (min): 28 min Charges:  OT General Charges $OT Visit: 1 Visit OT Evaluation $OT Eval Low Complexity: 1 Low OT Treatments $Self Care/Home Management : 8-22 mins  Branna Cortina, OTR/[facility] [place]  Office [phone] Pager: [phone]   Lenward Chancellor 10/22/2020, 10:47 AM

## 2020-10-24 ENCOUNTER — Other Ambulatory Visit (HOSPITAL_BASED_OUTPATIENT_CLINIC_OR_DEPARTMENT_OTHER): Payer: Self-pay

## 2020-10-24 ENCOUNTER — Encounter (HOSPITAL_COMMUNITY): Payer: Self-pay | Admitting: Orthopedic Surgery

## 2020-10-24 LAB — POCT I-STAT, CHEM 8
BUN: 24 mg/dL — ABNORMAL HIGH (ref 6–20)
Calcium, Ion: 1.05 mmol/L — ABNORMAL LOW (ref 1.15–1.40)
Chloride: 101 mmol/L (ref 98–111)
Creatinine, Ser: 0.7 mg/dL (ref 0.44–1.00)
Glucose, Bld: 89 mg/dL (ref 70–99)
HCT: 36 % (ref 36.0–46.0)
Hemoglobin: 12.2 g/dL (ref 12.0–15.0)
Potassium: 7.6 mmol/L (ref 3.5–5.1)
Sodium: 131 mmol/L — ABNORMAL LOW (ref 135–145)
TCO2: 27 mmol/L (ref 22–32)

## 2020-10-25 NOTE — Anesthesia Postprocedure Evaluation (Signed)
Anesthesia Post Note  Patient: [name]  Procedure(s) Performed: REVERSE SHOULDER ARTHROPLASTY (Left Shoulder)     Patient location during evaluation: PACU Anesthesia Type: General Level of consciousness: awake and alert Pain management: pain level controlled Vital Signs Assessment: post-procedure vital signs reviewed and stable Respiratory status: spontaneous breathing, nonlabored ventilation, respiratory function stable and patient connected to nasal cannula oxygen Cardiovascular status: blood pressure returned to baseline and stable Postop Assessment: no apparent nausea or vomiting Anesthetic complications: no   No complications documented.  Last Vitals:  Vitals:   10/22/20 0613 10/22/20 0901  BP: (!) 152/86 114/85  Pulse: 67 68  Resp: 16 18  Temp: 36.9 C 36.9 C  SpO2: 100% 98%    Last Pain:  Vitals:   10/22/20 0901  TempSrc: Oral  PainSc:                  Tiajuana Amass

## 2020-10-26 ENCOUNTER — Other Ambulatory Visit (HOSPITAL_BASED_OUTPATIENT_CLINIC_OR_DEPARTMENT_OTHER): Payer: Self-pay

## 2020-11-02 NOTE — Assessment & Plan Note (Addendum)
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 Adjuvant chemotherapy with Adriamycin/Cytoxan x 4, then weekly Taxol x 12completed 04/19/17 2. Adjuvant radiation therapycompleted on 07/26/2017 3. Adjuvant antiestrogen therapystarts 08/28/17 -------------------------------------------------------------------------------------------------------------------------------- Plan: Adj Anti estrogen therapy with Letrozole 2.5 mg daily switched to Anastrozole  Anastrozoletoxicities:Bone pain better but now having Hip pain.  Shoulder replacement surgery June 1st.  Chemo-induced peripheral neuropathy:She stoppedgabapentinbut continues to have neuropathy in her feet and now in her hands as well.  Breast cancer surveillance: 1.Breast exam 07/15/2020: Benign 2.Mammogram: 03/17/2020: Benign breast density category D (MRI recommended because of high density and family history) 3. Breast MRI 08/29/20: Benign Density Cat C. 4. CT Abd and pelvis 07/28/20: Post surgical changes  Hospitalization: 07/28/2020-07/31/2018: Generalized abdominal pain, enteritis, GI bleeding Abd pain still persists: Being scheduled for another CT and endoscopy  Patient will call us back after couple of weeks to inform us about what happened by stopping anastrozole.  If the hip pain is related to osteoarthritis it is not likely to get better by stopping anastrozole.  In that case she will resume anastrozole therapy. If the hip pain got better by stopping anastrozole we can consider switching her to exemestane.  Hospitalization: 10/21/20-10/22/20: rev shoulder arthroplasty

## 2020-11-02 NOTE — Progress Notes (Signed)
HEMATOLOGY-ONCOLOGY TELEPHONE VISIT PROGRESS NOTE  I connected with [name] on 11/03/2020 at  9:30 AM EDT by telephone and verified that I am speaking with the correct person using two identifiers.  I discussed the limitations, risks, security and privacy concerns of performing an evaluation and management service by telephone and the availability of in person appointments.  I also discussed with [name] that there may be a [name] responsible charge related to this service. [name] expressed understanding and agreed to proceed.   History of Present Illness: [name] is a 60 y.o. female with above-mentioned history of left breast cancer treated with lumpectomy, adjuvant chemotherapy, radiation, and who is currently on antiestrogen therapy with letrozole. She presents over the phone today for follow-up.  Oncology History  Malignant neoplasm of lower-outer quadrant of left breast of female, estrogen receptor positive ([place])  06/26/2016 Initial Diagnosis   Left breast biopsy 3:00: IDC with DCIS, grade 3, ER 80%, PR 20%, Ki-67 60%, HER-2 negative ratio 1.18; palpable lump: 1.8 cm lesion, no lymph nodes, T1 cN0 stage I a clinical stage    07/26/2016 Surgery   Left lumpectomy: IDC 1.8 cm, with DCIS, margins negative, 0/2 lymph nodes, ER 80%, PR 20%, HER-2 negative ratio 1.18, Ki-67 60%, T1cN0 stage IA     08/21/2016 Oncotype testing   Oncotype DX recurrence score 51, risk of recurrence 34%    08/21/2016 Genetic Testing   Genetic counseling and testing for hereditary cancer syndromes performed on 08/21/2016. Results are negative for pathogenic mutations in 46 genes analyzed by [facility]'s Common Hereditary Cancers Panel. Results are dated 09/14/2016. Genes tested: APC, ATM, AXIN2, BARD1, BMPR1A, BRCA1, BRCA2, BRIP1, CDH1, CDKN2A, CHEK2, CTNNA1, DICER1, EPCAM, GREM1, HOXB13, KIT, MEN1, MLH1, MSH2, MSH3, MSH6, MUTYH, NBN, NF1, NTHL1, PALB2, PDGFRA, PMS2, POLD1, POLE, PTEN,  RAD50, RAD51C, RAD51D, SDHA, SDHB, SDHC, SDHD, SMAD4, SMARCA4, STK11, TP53, TSC1, TSC2, and VHL.      11/02/2016 - 04/19/2017 Chemotherapy   Dose dense Adriamycin and Cytoxan 4 followed by Taxol weekly 12    06/10/2017 - 07/26/2017 Radiation Therapy   Adjuvant radiation with Dr. Lisbeth Renshaw    08/2017 -  Anti-estrogen oral therapy   Letrozole daily switched to Anastrozole (due to muscle pains)     Observations/Objective:     Assessment Plan:  Malignant neoplasm of lower-outer quadrant of left breast of female, estrogen receptor positive ([place]) 07/26/2016: Left lumpectomy: IDC 1.8 cm, with DCIS, margins negative, 0/2 lymph nodes, ER 80%, PR 20%, HER-2 negative ratio 1.18, Ki-67 60%, T1cN0 stage IA   Oncotype DX score 51 : 10 year risk of recurrence greater than 34%, extremely high risk   Treatment Summary: 1. Adjuvant chemotherapy with Adriamycin/Cytoxan x 4, then weekly Taxol x 12 completed 04/19/17 2. Adjuvant radiation therapy completed on 07/26/2017 3. Adjuvant antiestrogen therapy starts 08/28/17 -------------------------------------------------------------------------------------------------------------------------------- Plan: Adj Anti estrogen therapy with Letrozole 2.5 mg daily switched to Anastrozole, bone pain and joint pain even on exemestane Switching to tamoxifen (she will start at 5 mg daily starting 12/03/2020) with the plan to slowly titrate the dosage upwards.   Aromatase inhibitor toxicities: Severe joint pains  Chemo-induced peripheral neuropathy: She stopped gabapentin but continues to have neuropathy in her feet and now in her hands as well.   Breast cancer surveillance: 1.  Breast exam 07/15/2020: Benign 2.  Mammogram: 03/17/2020: Benign breast density category D (MRI recommended because of high density and family history) 3. Breast MRI 08/29/20: Benign Density Cat C. 4. CT Abd and  pelvis 07/28/20: Post surgical changes   Hospitalization: 07/28/2020-07/31/2018:  Generalized abdominal pain, enteritis, GI bleeding Abd pain still persists: Being scheduled for another CT and endoscopy   Hospitalization: 10/21/20-10/22/20: rev shoulder arthroplasty   I will call her mid August to see if she is tolerating the 5 mg of tamoxifen.  I discussed with her the interaction between tamoxifen and Myrbetriq.  She will stop Myrbetriq a few days before starting tamoxifen.  Telephone visit mid August to discuss tolerance to tamoxifen.  I discussed the assessment and treatment plan with [name]. [name] was provided an opportunity to ask questions and all were answered. [name] agreed with the plan and demonstrated an understanding of the instructions. [name] was advised to call back or seek an in-person evaluation if the symptoms worsen or if the condition fails to improve as anticipated.   Total time spent: 12 mins including non-face to face time and time spent for planning, charting and coordination of care  Rulon Eisenmenger, MD 11/03/2020    I, Thana Ates, am acting as scribe for Nicholas Lose, MD.  I have reviewed the above documentation for accuracy and completeness, and I agree with the above.

## 2020-11-03 ENCOUNTER — Inpatient Hospital Stay: Payer: Medicare Other | Attending: Hematology and Oncology | Admitting: Hematology and Oncology

## 2020-11-03 ENCOUNTER — Other Ambulatory Visit (HOSPITAL_BASED_OUTPATIENT_CLINIC_OR_DEPARTMENT_OTHER): Payer: Self-pay

## 2020-11-03 DIAGNOSIS — Z170 Estrogen receptor positive status [ER+]: Secondary | ICD-10-CM | POA: Diagnosis not present

## 2020-11-03 DIAGNOSIS — Z4789 Encounter for other orthopedic aftercare: Secondary | ICD-10-CM | POA: Diagnosis not present

## 2020-11-03 DIAGNOSIS — C50512 Malignant neoplasm of lower-outer quadrant of left female breast: Secondary | ICD-10-CM | POA: Diagnosis not present

## 2020-11-03 DIAGNOSIS — Z79811 Long term (current) use of aromatase inhibitors: Secondary | ICD-10-CM | POA: Insufficient documentation

## 2020-11-03 DIAGNOSIS — Z923 Personal history of irradiation: Secondary | ICD-10-CM | POA: Insufficient documentation

## 2020-11-03 DIAGNOSIS — Z9221 Personal history of antineoplastic chemotherapy: Secondary | ICD-10-CM | POA: Insufficient documentation

## 2020-11-03 MED ORDER — TAMOXIFEN CITRATE 10 MG PO TABS
10.0000 mg | ORAL_TABLET | Freq: Every day | ORAL | 3 refills | Status: DC
  Filled 2020-11-03: qty 90, 90d supply, fill #0
  Filled 2021-08-24: qty 90, 90d supply, fill #1

## 2020-11-04 ENCOUNTER — Other Ambulatory Visit (HOSPITAL_BASED_OUTPATIENT_CLINIC_OR_DEPARTMENT_OTHER): Payer: Self-pay

## 2020-11-08 ENCOUNTER — Telehealth: Payer: Self-pay | Admitting: Hematology and Oncology

## 2020-11-08 DIAGNOSIS — H524 Presbyopia: Secondary | ICD-10-CM | POA: Diagnosis not present

## 2020-11-08 DIAGNOSIS — H04123 Dry eye syndrome of bilateral lacrimal glands: Secondary | ICD-10-CM | POA: Diagnosis not present

## 2020-11-08 DIAGNOSIS — H53001 Unspecified amblyopia, right eye: Secondary | ICD-10-CM | POA: Diagnosis not present

## 2020-11-08 NOTE — Telephone Encounter (Signed)
Scheduled appointment per 06/21 los. Patient is aware. 

## 2020-11-09 ENCOUNTER — Other Ambulatory Visit (HOSPITAL_BASED_OUTPATIENT_CLINIC_OR_DEPARTMENT_OTHER): Payer: Self-pay

## 2020-11-14 ENCOUNTER — Other Ambulatory Visit (HOSPITAL_BASED_OUTPATIENT_CLINIC_OR_DEPARTMENT_OTHER): Payer: Self-pay

## 2020-11-15 ENCOUNTER — Other Ambulatory Visit: Payer: Self-pay

## 2020-11-15 ENCOUNTER — Other Ambulatory Visit (HOSPITAL_BASED_OUTPATIENT_CLINIC_OR_DEPARTMENT_OTHER): Payer: Self-pay

## 2020-11-18 ENCOUNTER — Encounter: Payer: Self-pay | Admitting: Hematology and Oncology

## 2020-11-28 ENCOUNTER — Other Ambulatory Visit: Payer: Self-pay

## 2020-11-28 ENCOUNTER — Ambulatory Visit
Admission: RE | Admit: 2020-11-28 | Discharge: 2020-11-28 | Disposition: A | Discharge status: Home / Self Care | Payer: Medicare Other | Source: Ambulatory Visit | Attending: Hematology and Oncology | Admitting: Hematology and Oncology

## 2020-11-28 DIAGNOSIS — M85852 Other specified disorders of bone density and structure, left thigh: Secondary | ICD-10-CM | POA: Diagnosis not present

## 2020-11-28 DIAGNOSIS — Z780 Asymptomatic menopausal state: Secondary | ICD-10-CM | POA: Diagnosis not present

## 2020-11-28 DIAGNOSIS — C50512 Malignant neoplasm of lower-outer quadrant of left female breast: Secondary | ICD-10-CM

## 2020-11-30 ENCOUNTER — Encounter: Payer: Self-pay | Admitting: Hematology and Oncology

## 2020-11-30 DIAGNOSIS — M7541 Impingement syndrome of right shoulder: Secondary | ICD-10-CM | POA: Diagnosis not present

## 2020-11-30 DIAGNOSIS — Z4789 Encounter for other orthopedic aftercare: Secondary | ICD-10-CM | POA: Diagnosis not present

## 2020-12-15 ENCOUNTER — Ambulatory Visit: Payer: Medicare Other | Attending: Internal Medicine

## 2020-12-15 DIAGNOSIS — Z23 Encounter for immunization: Secondary | ICD-10-CM

## 2020-12-15 NOTE — Progress Notes (Signed)
   Covid-19 Vaccination Clinic  Name:  [name]    MRN: [id number] DOB: 1960-12-23  12/15/2020  [name] was observed post Covid-19 immunization for 15 minutes without incident. She was provided with Vaccine Information Sheet and instruction to access the V-Safe system.   [name] was instructed to call 911 with any severe reactions post vaccine: Difficulty breathing  Swelling of face and throat  A fast heartbeat  A bad rash all over body  Dizziness and weakness   Immunizations Administered     Name Date Dose VIS Date Route   Moderna Covid-19 Booster Vaccine 12/15/2020 12:34 PM 0.25 mL 03/09/2020 Intramuscular   Manufacturer: Moderna   Lot: [id number]   [id number]

## 2020-12-18 ENCOUNTER — Other Ambulatory Visit (HOSPITAL_BASED_OUTPATIENT_CLINIC_OR_DEPARTMENT_OTHER): Payer: Self-pay

## 2020-12-18 ENCOUNTER — Other Ambulatory Visit (INDEPENDENT_AMBULATORY_CARE_PROVIDER_SITE_OTHER): Payer: Self-pay | Admitting: Bariatrics

## 2020-12-18 DIAGNOSIS — E559 Vitamin D deficiency, unspecified: Secondary | ICD-10-CM

## 2020-12-19 ENCOUNTER — Other Ambulatory Visit (HOSPITAL_BASED_OUTPATIENT_CLINIC_OR_DEPARTMENT_OTHER): Payer: Self-pay

## 2020-12-19 MED ORDER — VALACYCLOVIR HCL 500 MG PO TABS
ORAL_TABLET | ORAL | 0 refills | Status: DC
  Filled 2020-12-19: qty 180, 90d supply, fill #0

## 2020-12-19 MED ORDER — SOLIFENACIN SUCCINATE 10 MG PO TABS
ORAL_TABLET | ORAL | 1 refills | Status: DC
  Filled 2020-12-19: qty 90, 90d supply, fill #0
  Filled 2021-03-29: qty 90, 90d supply, fill #1

## 2020-12-19 MED ORDER — LISINOPRIL 10 MG PO TABS
ORAL_TABLET | ORAL | 1 refills | Status: DC
  Filled 2020-12-19: qty 90, 90d supply, fill #0
  Filled 2021-05-19: qty 90, 90d supply, fill #1

## 2020-12-19 NOTE — Telephone Encounter (Signed)
Dr.Brown 

## 2020-12-20 ENCOUNTER — Other Ambulatory Visit (HOSPITAL_BASED_OUTPATIENT_CLINIC_OR_DEPARTMENT_OTHER): Payer: Self-pay

## 2020-12-20 ENCOUNTER — Encounter (INDEPENDENT_AMBULATORY_CARE_PROVIDER_SITE_OTHER): Payer: Self-pay | Admitting: Bariatrics

## 2020-12-20 ENCOUNTER — Other Ambulatory Visit (INDEPENDENT_AMBULATORY_CARE_PROVIDER_SITE_OTHER): Payer: Self-pay

## 2020-12-20 ENCOUNTER — Other Ambulatory Visit (INDEPENDENT_AMBULATORY_CARE_PROVIDER_SITE_OTHER): Payer: Self-pay | Admitting: Bariatrics

## 2020-12-20 ENCOUNTER — Encounter: Payer: Self-pay | Admitting: Hematology and Oncology

## 2020-12-20 DIAGNOSIS — E559 Vitamin D deficiency, unspecified: Secondary | ICD-10-CM

## 2020-12-20 MED ORDER — COVID-19 MRNA VACC (MODERNA) 100 MCG/0.5ML IM SUSP
INTRAMUSCULAR | 0 refills | Status: DC
  Filled 2020-12-20: qty 0.25, 1d supply, fill #0

## 2020-12-20 MED ORDER — CHOLECALCIFEROL 1.25 MG (50000 UT) PO CAPS
50000.0000 [IU] | ORAL_CAPSULE | ORAL | 0 refills | Status: DC
  Filled 2020-12-20: qty 4, 28d supply, fill #0

## 2020-12-20 MED ORDER — VITAMIN D (ERGOCALCIFEROL) 1.25 MG (50000 UNIT) PO CAPS
50000.0000 [IU] | ORAL_CAPSULE | ORAL | 1 refills | Status: DC
  Filled 2020-12-20: qty 6, 84d supply, fill #0

## 2020-12-20 MED ORDER — CELECOXIB 200 MG PO CAPS
ORAL_CAPSULE | ORAL | 2 refills | Status: DC
  Filled 2020-12-20: qty 30, 30d supply, fill #0

## 2020-12-20 NOTE — Telephone Encounter (Signed)
Please review

## 2020-12-28 ENCOUNTER — Other Ambulatory Visit (HOSPITAL_BASED_OUTPATIENT_CLINIC_OR_DEPARTMENT_OTHER): Payer: Self-pay

## 2020-12-29 ENCOUNTER — Other Ambulatory Visit (HOSPITAL_BASED_OUTPATIENT_CLINIC_OR_DEPARTMENT_OTHER): Payer: Self-pay

## 2020-12-29 DIAGNOSIS — Z4789 Encounter for other orthopedic aftercare: Secondary | ICD-10-CM | POA: Diagnosis not present

## 2020-12-29 MED ORDER — METHOCARBAMOL 500 MG PO TABS
ORAL_TABLET | ORAL | 1 refills | Status: DC
  Filled 2020-12-29: qty 60, 15d supply, fill #0

## 2020-12-29 MED ORDER — GABAPENTIN 100 MG PO CAPS
ORAL_CAPSULE | ORAL | 1 refills | Status: DC
  Filled 2020-12-29: qty 30, 30d supply, fill #0

## 2021-01-03 NOTE — Assessment & Plan Note (Signed)
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 Adjuvant chemotherapy with Adriamycin/Cytoxan x 4, then weekly Taxol x 12completed 04/19/17 2. Adjuvant radiation therapycompleted on 07/26/2017 3. Adjuvant antiestrogen therapystarts 08/28/17 -------------------------------------------------------------------------------------------------------------------------------- Plan: Adj Anti estrogen therapy with Letrozole 2.5 mg dailyswitched to Anastrozole, bone pain and joint pain even on exemestane Switching to tamoxifen (she will start at 5 mg daily starting 12/03/2020) with the plan to slowly titrate the dosage upwards.  Aromatase inhibitor toxicities: Severe joint pains  Chemo-induced peripheral neuropathy:She stoppedgabapentinbut continues to have neuropathy in her feet and now in her hands as well.  Breast cancer surveillance: 1.Breast exam 07/15/2020: Benign 2.Mammogram: 03/17/2020: Benign breast density category D (MRI recommended because of high density and family history) 3. Breast MRI4/11/22: Benign Density Cat C. 4. CT Abd and pelvis 07/28/20: Post surgical changes  Hospitalization: 07/28/2020-07/31/2018: Generalized abdominal pain, enteritis, GI bleeding Abd pain still persists: Being scheduled for another CT and endoscopy  Hospitalization: 10/21/20-10/22/20: rev shoulder arthroplasty  I will call her mid August to see if she is tolerating the 5 mg of tamoxifen.  I discussed with her the interaction between tamoxifen and Myrbetriq.  She will stop Myrbetriq a few days before starting tamoxifen.

## 2021-01-03 NOTE — Progress Notes (Signed)
HEMATOLOGY-ONCOLOGY TELEPHONE VISIT PROGRESS NOTE  I connected with [name] on 01/04/2021 at  9:15 AM EDT by telephone and verified that I am speaking with the correct person using two identifiers.  I discussed the limitations, risks, security and privacy concerns of performing an evaluation and management service by telephone and the availability of in person appointments.  I also discussed with [name] that there may be a [name] responsible charge related to this service. [name] expressed understanding and agreed to proceed.   History of Present Illness: [name] is a 60 y.o. female with above-mentioned history of left breast cancer treated with lumpectomy, adjuvant chemotherapy, radiation, and who is currently on antiestrogen therapy with tamoxifen 5 mg daily. She presents over the phone today for follow-up.  She does note she is tolerating the 5 mg tamoxifen reasonably well.  She continues to have diffuse muscle aches and pains.  Hot flashes and joint stiffness.  However she is willing to tolerate some of the side effects to get the benefit from antiestrogen therapy.  Oncology History  Malignant neoplasm of lower-outer quadrant of left breast of female, estrogen receptor positive ([place])  06/26/2016 Initial Diagnosis   Left breast biopsy 3:00: IDC with DCIS, grade 3, ER 80%, PR 20%, Ki-67 60%, HER-2 negative ratio 1.18; palpable lump: 1.8 cm lesion, no lymph nodes, T1 cN0 stage I a clinical stage   07/26/2016 Surgery   Left lumpectomy: IDC 1.8 cm, with DCIS, margins negative, 0/2 lymph nodes, ER 80%, PR 20%, HER-2 negative ratio 1.18, Ki-67 60%, T1cN0 stage IA    08/21/2016 Oncotype testing   Oncotype DX recurrence score 51, risk of recurrence 34%   08/21/2016 Genetic Testing   Genetic counseling and testing for hereditary cancer syndromes performed on 08/21/2016. Results are negative for pathogenic mutations in 46 genes analyzed by [facility] Common Hereditary  Cancers Panel. Results are dated 09/14/2016. Genes tested: APC, ATM, AXIN2, BARD1, BMPR1A, BRCA1, BRCA2, BRIP1, CDH1, CDKN2A, CHEK2, CTNNA1, DICER1, EPCAM, GREM1, HOXB13, KIT, MEN1, MLH1, MSH2, MSH3, MSH6, MUTYH, NBN, NF1, NTHL1, PALB2, PDGFRA, PMS2, POLD1, POLE, PTEN, RAD50, RAD51C, RAD51D, SDHA, SDHB, SDHC, SDHD, SMAD4, SMARCA4, STK11, TP53, TSC1, TSC2, and VHL.     11/02/2016 - 04/19/2017 Chemotherapy   Dose dense Adriamycin and Cytoxan 4 followed by Taxol weekly 12   06/10/2017 - 07/26/2017 Radiation Therapy   Adjuvant radiation with Dr. Lisbeth Renshaw   08/2017 -  Anti-estrogen oral therapy   Letrozole daily switched to Anastrozole (due to muscle pains)     Observations/Objective:     Assessment Plan:  Malignant neoplasm of lower-outer quadrant of left breast of female, estrogen receptor positive ([place]) 07/26/2016: Left lumpectomy: IDC 1.8 cm, with DCIS, margins negative, 0/2 lymph nodes, ER 80%, PR 20%, HER-2 negative ratio 1.18, Ki-67 60%, T1cN0 stage IA   Oncotype DX score 51 : 10 year risk of recurrence greater than 34%, extremely high risk   Treatment Summary: 1. Adjuvant chemotherapy with Adriamycin/Cytoxan x 4, then weekly Taxol x 12 completed 04/19/17 2. Adjuvant radiation therapy completed on 07/26/2017 3. Adjuvant antiestrogen therapy started 08/28/17 -------------------------------------------------------------------------------------------------------------------------------- Plan: Adj Anti estrogen therapy with Letrozole 2.5 mg daily switched to Anastrozole, bone pain and joint pain even on exemestane Switching to tamoxifen (she will start at 5 mg daily starting 12/03/2020) with the plan to slowly titrate the dosage upwards.   Chemo-induced peripheral neuropathy: resumed gabapentin  Breast cancer surveillance: 1.  Breast exam 07/15/2020: Benign 2.  Mammogram: 03/17/2020: Benign breast density category D (  MRI recommended because of high density and family history) 3. Breast MRI  08/29/20: Benign Density Cat C. 4. CT Abd and pelvis 07/28/20: Post surgical changes   Hospitalization: 07/28/2020-07/31/2018: Generalized abdominal pain, enteritis, GI bleeding Hospitalization: 10/21/20-10/22/20: rev shoulder arthroplasty   5 mg of tamoxifen: side effects:  fatigue, joint pains She is willing to continue with the tamoxifen at 5 mg daily.  Bone density 11/28/2020: T score -2: Osteopenia: Currently on calcium and vitamin D.  We discussed pros and cons of bisphosphonate therapy and decided to hold off on those at this time.  With tamoxifen on board hopefully her bone density will remain stable in 2 years.  Return to clinic in November for follow-up  I discussed the assessment and treatment plan with [name]. [name] was provided an opportunity to ask questions and all were answered. [name] agreed with the plan and demonstrated an understanding of the instructions. [name] was advised to call back or seek an in-person evaluation if the symptoms worsen or if the condition fails to improve as anticipated.   Total time spent: 11 mins including non-face to face time and time spent for planning, charting and coordination of care  Rulon Eisenmenger, MD 01/04/2021    I, Thana Ates, am acting as scribe for Nicholas Lose, MD.  I have reviewed the above documentation for accuracy and completeness, and I agree with the above.

## 2021-01-04 ENCOUNTER — Inpatient Hospital Stay: Payer: Medicare Other | Attending: Hematology and Oncology | Admitting: Hematology and Oncology

## 2021-01-04 DIAGNOSIS — C50512 Malignant neoplasm of lower-outer quadrant of left female breast: Secondary | ICD-10-CM

## 2021-01-04 DIAGNOSIS — Z170 Estrogen receptor positive status [ER+]: Secondary | ICD-10-CM

## 2021-01-17 DIAGNOSIS — R140 Abdominal distension (gaseous): Secondary | ICD-10-CM | POA: Diagnosis not present

## 2021-01-17 DIAGNOSIS — Z9884 Bariatric surgery status: Secondary | ICD-10-CM | POA: Diagnosis not present

## 2021-01-17 DIAGNOSIS — R112 Nausea with vomiting, unspecified: Secondary | ICD-10-CM | POA: Diagnosis not present

## 2021-01-17 DIAGNOSIS — R1030 Lower abdominal pain, unspecified: Secondary | ICD-10-CM | POA: Diagnosis not present

## 2021-01-17 DIAGNOSIS — R935 Abnormal findings on diagnostic imaging of other abdominal regions, including retroperitoneum: Secondary | ICD-10-CM | POA: Diagnosis not present

## 2021-01-18 DIAGNOSIS — Z9884 Bariatric surgery status: Secondary | ICD-10-CM | POA: Diagnosis not present

## 2021-01-18 DIAGNOSIS — R140 Abdominal distension (gaseous): Secondary | ICD-10-CM | POA: Diagnosis not present

## 2021-01-24 ENCOUNTER — Other Ambulatory Visit (HOSPITAL_BASED_OUTPATIENT_CLINIC_OR_DEPARTMENT_OTHER): Payer: Self-pay

## 2021-01-24 MED ORDER — CELECOXIB 200 MG PO CAPS
ORAL_CAPSULE | ORAL | 2 refills | Status: DC
  Filled 2021-01-24: qty 90, 90d supply, fill #0
  Filled 2021-05-28: qty 90, 90d supply, fill #1
  Filled 2021-08-24: qty 90, 90d supply, fill #2

## 2021-01-24 MED ORDER — GABAPENTIN 300 MG PO CAPS
ORAL_CAPSULE | ORAL | 2 refills | Status: DC
  Filled 2021-01-24: qty 90, 30d supply, fill #0

## 2021-01-25 DIAGNOSIS — R112 Nausea with vomiting, unspecified: Secondary | ICD-10-CM | POA: Diagnosis not present

## 2021-01-25 DIAGNOSIS — Z9884 Bariatric surgery status: Secondary | ICD-10-CM | POA: Diagnosis not present

## 2021-01-25 DIAGNOSIS — R140 Abdominal distension (gaseous): Secondary | ICD-10-CM | POA: Diagnosis not present

## 2021-02-09 DIAGNOSIS — Z4789 Encounter for other orthopedic aftercare: Secondary | ICD-10-CM | POA: Diagnosis not present

## 2021-02-22 ENCOUNTER — Other Ambulatory Visit (HOSPITAL_BASED_OUTPATIENT_CLINIC_OR_DEPARTMENT_OTHER): Payer: Self-pay

## 2021-02-22 DIAGNOSIS — N3090 Cystitis, unspecified without hematuria: Secondary | ICD-10-CM | POA: Diagnosis not present

## 2021-02-22 DIAGNOSIS — R300 Dysuria: Secondary | ICD-10-CM | POA: Diagnosis not present

## 2021-02-22 MED ORDER — CEPHALEXIN 500 MG PO CAPS
ORAL_CAPSULE | ORAL | 0 refills | Status: DC
  Filled 2021-02-22: qty 15, 5d supply, fill #0

## 2021-02-23 DIAGNOSIS — R143 Flatulence: Secondary | ICD-10-CM | POA: Diagnosis not present

## 2021-02-23 DIAGNOSIS — A049 Bacterial intestinal infection, unspecified: Secondary | ICD-10-CM | POA: Diagnosis not present

## 2021-02-23 DIAGNOSIS — R112 Nausea with vomiting, unspecified: Secondary | ICD-10-CM | POA: Diagnosis not present

## 2021-02-23 DIAGNOSIS — R140 Abdominal distension (gaseous): Secondary | ICD-10-CM | POA: Diagnosis not present

## 2021-02-23 DIAGNOSIS — Z9884 Bariatric surgery status: Secondary | ICD-10-CM | POA: Diagnosis not present

## 2021-02-23 DIAGNOSIS — R109 Unspecified abdominal pain: Secondary | ICD-10-CM | POA: Diagnosis not present

## 2021-02-24 ENCOUNTER — Encounter: Payer: Self-pay | Admitting: Hematology and Oncology

## 2021-02-24 ENCOUNTER — Other Ambulatory Visit: Payer: Self-pay | Admitting: Hematology and Oncology

## 2021-02-24 ENCOUNTER — Other Ambulatory Visit (HOSPITAL_BASED_OUTPATIENT_CLINIC_OR_DEPARTMENT_OTHER): Payer: Self-pay

## 2021-02-24 DIAGNOSIS — Z1231 Encounter for screening mammogram for malignant neoplasm of breast: Secondary | ICD-10-CM

## 2021-02-24 MED ORDER — XIFAXAN 550 MG PO TABS
ORAL_TABLET | ORAL | 0 refills | Status: DC
  Filled 2021-02-24: qty 42, 14d supply, fill #0

## 2021-02-27 ENCOUNTER — Ambulatory Visit: Payer: Medicare Other | Attending: Internal Medicine

## 2021-02-27 ENCOUNTER — Other Ambulatory Visit (HOSPITAL_BASED_OUTPATIENT_CLINIC_OR_DEPARTMENT_OTHER): Payer: Self-pay

## 2021-02-27 DIAGNOSIS — Z23 Encounter for immunization: Secondary | ICD-10-CM

## 2021-02-27 NOTE — Progress Notes (Signed)
   Covid-19 Vaccination Clinic  Name:  [name]    MRN: [id number] DOB: 1960-11-16  02/27/2021  [name] was observed post Covid-19 immunization for 15 minutes without incident. She was provided with Vaccine Information Sheet and instruction to access the V-Safe system.   [name] was instructed to call 911 with any severe reactions post vaccine: Difficulty breathing  Swelling of face and throat  A fast heartbeat  A bad rash all over body  Dizziness and weakness

## 2021-02-28 ENCOUNTER — Other Ambulatory Visit (HOSPITAL_BASED_OUTPATIENT_CLINIC_OR_DEPARTMENT_OTHER): Payer: Self-pay

## 2021-03-01 ENCOUNTER — Other Ambulatory Visit (HOSPITAL_BASED_OUTPATIENT_CLINIC_OR_DEPARTMENT_OTHER): Payer: Self-pay

## 2021-03-01 ENCOUNTER — Encounter: Payer: Self-pay | Admitting: Hematology and Oncology

## 2021-03-02 ENCOUNTER — Ambulatory Visit (INDEPENDENT_AMBULATORY_CARE_PROVIDER_SITE_OTHER): Payer: Medicare Other | Admitting: Bariatrics

## 2021-03-02 ENCOUNTER — Encounter (INDEPENDENT_AMBULATORY_CARE_PROVIDER_SITE_OTHER): Payer: Self-pay | Admitting: Bariatrics

## 2021-03-02 ENCOUNTER — Other Ambulatory Visit: Payer: Self-pay

## 2021-03-02 VITALS — BP 108/67 | HR 64 | Temp 98.7°F | Ht 61.0 in | Wt 204.0 lb

## 2021-03-02 DIAGNOSIS — E8881 Metabolic syndrome: Secondary | ICD-10-CM

## 2021-03-02 DIAGNOSIS — Z9884 Bariatric surgery status: Secondary | ICD-10-CM

## 2021-03-02 DIAGNOSIS — Z6837 Body mass index (BMI) 37.0-37.9, adult: Secondary | ICD-10-CM

## 2021-03-06 ENCOUNTER — Other Ambulatory Visit (HOSPITAL_BASED_OUTPATIENT_CLINIC_OR_DEPARTMENT_OTHER): Payer: Self-pay

## 2021-03-06 MED ORDER — CEPHALEXIN 500 MG PO CAPS
ORAL_CAPSULE | ORAL | 0 refills | Status: DC
  Filled 2021-03-06: qty 21, 7d supply, fill #0

## 2021-03-06 NOTE — Progress Notes (Signed)
Chief Complaint:   OBESITY [name] is here to discuss her progress with her obesity treatment plan along with follow-up of her obesity related diagnoses. [name] is on practicing portion control and making smarter food choices, such as increasing vegetables and decreasing simple carbohydrates and states she is following her eating plan approximately 50% of the time. [name] states she is doing 0 minutes 0 times per week.  Today's visit was #: 24 Starting weight: 204 lbs Starting date: 02/18/2018 Today's weight: 204 lbs Today's date: 03/02/2021 Total lbs lost to date: 0 Total lbs lost since last in-office visit: 4 lbs  Interim History: [name] is down 4 lbs since her last visit.  Subjective:   1. Insulin resistance [name] is not on medications for Insulin resistance.  2. H/O gastric bypass [name] still has some restrictions.   Assessment/Plan:   1. Insulin resistance [name] will continue to work on weight loss, increasing exercise, activities and refined simple carbohydrates and sweets to help decrease the risk of diabetes. [name] agreed to follow-up with [name] as directed to closely monitor her progress.   2. H/O gastric bypass [name] small frequent meals. She will continue with vitamin supplements.  3. Obesity, current BMI 38.7 [name] is currently in the action stage of change. As such, her goal is to continue with weight loss efforts. She has agreed to practicing portion control and making smarter food choices, such as increasing vegetables and decreasing simple carbohydrates.   [name] will continue meal planning and intentional eating.  Exercise goals: No exercise has been prescribed at this time.  Behavioral modification strategies: increasing lean protein intake, decreasing simple carbohydrates, increasing vegetables, increasing water intake, decreasing eating out, no skipping meals, meal planning and cooking strategies, keeping healthy foods in the  home, and planning for success.  [name] has agreed to follow-up with our clinic in 5 months. She was informed of the importance of frequent follow-up visits to maximize her success with intensive lifestyle modifications for her multiple health conditions.   Objective:   Blood pressure 108/67, pulse 64, temperature 98.7 F (37.1 C), height 5\' 1"  (1.549 m), weight 204 lb (92.5 kg), SpO2 100 %. Body mass index is 38.55 kg/m. [name] is using a cane for ambulation.  General: Cooperative, alert, well developed, in no acute distress. HEENT: Conjunctivae and lids unremarkable. Cardiovascular: Regular rhythm.  Lungs: Normal work of breathing. Neurologic: No focal deficits.   Lab Results  Component Value Date   CREATININE 0.70 10/21/2020   BUN 24 (H) 10/21/2020   NA 131 (L) 10/21/2020   K 7.6 (HH) 10/21/2020   CL 101 10/21/2020   CO2 27 10/21/2020   Lab Results  Component Value Date   ALT 16 07/29/2020   AST 17 07/29/2020   ALKPHOS 57 07/29/2020   BILITOT 0.8 07/29/2020   Lab Results  Component Value Date   HGBA1C 5.7 (H) 11/26/2019   HGBA1C 5.5 06/09/2019   HGBA1C 5.6 12/17/2018   HGBA1C 5.7 (H) 06/10/2018   HGBA1C 5.8 (H) 02/18/2018   Lab Results  Component Value Date   INSULIN 3.8 11/26/2019   INSULIN 7.0 06/09/2019   INSULIN 4.9 02/18/2018   Lab Results  Component Value Date   TSH 0.44 03/23/2020   Lab Results  Component Value Date   CHOL 215 (A) 03/23/2020   HDL 137 (A) 03/23/2020   LDLCALC 121 03/23/2020   TRIG 71 03/23/2020   CHOLHDL 2.5 11/07/2018   Lab Results  Component Value Date   VD25OH  40.7 03/23/2020   VD25OH 40.5 11/26/2019   VD25OH 40.6 06/09/2019   Lab Results  Component Value Date   WBC 4.9 10/21/2020   HGB 12.2 10/21/2020   HCT 36.0 10/21/2020   MCV 95.8 10/21/2020   PLT 178 10/21/2020   No results found for: IRON, TIBC, FERRITIN  Obesity Behavioral Intervention:   Approximately 15 minutes were spent on the discussion  below.  ASK: We discussed the diagnosis of obesity with [name] today and [name] agreed to give [name] permission to discuss obesity behavioral modification therapy today.  ASSESS: [name] has the diagnosis of obesity and her BMI today is 38.7. [name] is in the action stage of change.   ADVISE: [name] was educated on the multiple health risks of obesity as well as the benefit of weight loss to improve her health. She was advised of the need for long term treatment and the importance of lifestyle modifications to improve her current health and to decrease her risk of future health problems.  AGREE: Multiple dietary modification options and treatment options were discussed and [name] agreed to follow the recommendations documented in the above note.  ARRANGE: [name] was educated on the importance of frequent visits to treat obesity as outlined per CMS and USPSTF guidelines and agreed to schedule her next follow up appointment today.  Attestation Statements:   Reviewed by clinician on day of visit: allergies, medications, problem list, medical history, surgical history, family history, social history, and previous encounter notes.  I, [name], RMA, am acting as Location manager for [employer], DO.   I have reviewed the above documentation for accuracy and completeness, and I agree with the above. Alicia Lesch, DO

## 2021-03-07 ENCOUNTER — Encounter (INDEPENDENT_AMBULATORY_CARE_PROVIDER_SITE_OTHER): Payer: Self-pay | Admitting: Bariatrics

## 2021-03-21 NOTE — Assessment & Plan Note (Signed)
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 Adjuvant chemotherapy with Adriamycin/Cytoxan x 4, then weekly Taxol x 12completed 04/19/17 2. Adjuvant radiation therapycompleted on 07/26/2017 3. Adjuvant antiestrogen therapystarted 08/28/17 -------------------------------------------------------------------------------------------------------------------------------- Plan: Adj Anti estrogen therapy with Letrozole 2.5 mg dailyswitched to Anastrozole, bone pain and joint pain even on exemestane Switching to tamoxifen (she will start at 5 mg daily starting 12/03/2020) with the plan to slowly titrate the dosage upwards.  Chemo-induced peripheral neuropathy:resumedgabapentin Breast cancer surveillance: 1.Breast exam 07/15/2020: Benign 2.Mammogram: 03/17/2020: Benign breast density category D (MRI recommended because of high density and family history) 3. Breast MRI4/11/22: Benign Density Cat C. 4. CT Abd and pelvis 07/28/20: Post surgical changes  Hospitalization: 07/28/2020-07/31/2018: Generalized abdominal pain, enteritis, GI bleeding Hospitalization: 10/21/20-10/22/20: rev shoulder arthroplasty  5 mg of tamoxifen: side effects: fatigue, joint pains She is willing to continue with the tamoxifen at 5 mg daily.  Bone density 11/28/2020: T score -2: Osteopenia: Currently on calcium and vitamin D.  We discussed pros and cons of bisphosphonate therapy and decided to hold off on those at this time.  With tamoxifen on board hopefully her bone density will remain stable in 2 years.  Return to clinic in November for follow-up

## 2021-03-21 NOTE — Progress Notes (Signed)
Patient Care Team: [name], MD as PCP - General (Family Medicine) Croitoru, Dani Gobble, MD as PCP - Cardiology (Cardiology) Nicholas Lose, MD as Consulting Physician (Hematology and Oncology) Kyung Rudd, MD as Consulting Physician (Radiation Oncology) Erroll Luna, MD as Consulting Physician (General Surgery) Gardenia Phlegm, NP as Nurse Practitioner (Hematology and Oncology)  DIAGNOSIS:    ICD-10-CM   1. Malignant neoplasm of lower-outer quadrant of left breast of female, estrogen receptor positive ([place])  C50.512    Z17.0       SUMMARY OF ONCOLOGIC HISTORY: Oncology History  Malignant neoplasm of lower-outer quadrant of left breast of female, estrogen receptor positive ([place])  06/26/2016 Initial Diagnosis   Left breast biopsy 3:00: IDC with DCIS, grade 3, ER 80%, PR 20%, Ki-67 60%, HER-2 negative ratio 1.18; palpable lump: 1.8 cm lesion, no lymph nodes, T1 cN0 stage I a clinical stage   07/26/2016 Surgery   Left lumpectomy: IDC 1.8 cm, with DCIS, margins negative, 0/2 lymph nodes, ER 80%, PR 20%, HER-2 negative ratio 1.18, Ki-67 60%, T1cN0 stage IA    08/21/2016 Oncotype testing   Oncotype DX recurrence score 51, risk of recurrence 34%   08/21/2016 Genetic Testing   Genetic counseling and testing for hereditary cancer syndromes performed on 08/21/2016. Results are negative for pathogenic mutations in 46 genes analyzed by [facility] Common Hereditary Cancers Panel. Results are dated 09/14/2016. Genes tested: APC, ATM, AXIN2, BARD1, BMPR1A, BRCA1, BRCA2, BRIP1, CDH1, CDKN2A, CHEK2, CTNNA1, DICER1, EPCAM, GREM1, HOXB13, KIT, MEN1, MLH1, MSH2, MSH3, MSH6, MUTYH, NBN, NF1, NTHL1, PALB2, PDGFRA, PMS2, POLD1, POLE, PTEN, RAD50, RAD51C, RAD51D, SDHA, SDHB, SDHC, SDHD, SMAD4, SMARCA4, STK11, TP53, TSC1, TSC2, and VHL.     11/02/2016 - 04/19/2017 Chemotherapy   Dose dense Adriamycin and Cytoxan 4 followed by Taxol weekly 12   06/10/2017 - 07/26/2017 Radiation Therapy   Adjuvant  radiation with Dr. Lisbeth Renshaw   08/2017 -  Anti-estrogen oral therapy   Letrozole daily switched to Anastrozole (due to muscle pains)     CHIEF COMPLIANT: Follow-up of left breast cancer  INTERVAL HISTORY: [name] is a 60 y.o. with above-mentioned history of left breast cancer treated with lumpectomy, adjuvant chemotherapy, radiation, and who is currently on antiestrogen therapy with tamoxifen 5 mg daily. She presents to the clinic today for follow-up.  She continues to suffer from peripheral neuropathy.  She also has diffuse body aches and pains due to tamoxifen.  She is currently on 5 mg tamoxifen and thinks she can handle it.  She also has very low energy levels and is requesting any supplement that can help with that.  ALLERGIES:  is allergic to aspirin, oxycodone hcl, propoxyphene n-acetaminophen, tramadol, adhesive [tape], and prednisone.  MEDICATIONS:  Current Outpatient Medications  Medication Sig Dispense Refill   albuterol (VENTOLIN HFA) 108 (90 Base) MCG/ACT inhaler INHALE 2 PUFFS BY MOUTH AS NEEDED EVERY 4 HOURS (Patient taking differently: Inhale 2 puffs into the lungs every 4 (four) hours as needed for shortness of breath or wheezing.) 8.5 g 6   aspirin EC 81 MG tablet Take 81 mg by mouth daily.     Calcium Citrate-Vitamin D (CALCIUM CITRATE +D PO) Take 2 tablets by mouth daily. Gummies     celecoxib (CELEBREX) 200 MG capsule Take 1 capsule by mouth every day 30 capsule 2   celecoxib (CELEBREX) 200 MG capsule Take 1 capsule by mouth everyday 90 capsule 2   cephALEXin (KEFLEX) 500 MG capsule Take 1 capsule by mouth three times a  day for 7 days 21 capsule 0   Cholecalciferol 1.25 MG (50000 UT) capsule Take 1 capsule (50,000 Units total) by mouth once a week. 4 capsule 0   COVID-19 mRNA vaccine, Moderna, 100 MCG/0.5ML injection Inject into the muscle. 0.25 mL 0   docusate sodium (COLACE) 100 MG capsule Take 100 mg by mouth 2 (two) times daily.     ferrous gluconate  (FERGON) 324 MG tablet TAKE 1 TABLET (324 MG TOTAL) BY MOUTH DAILY WITH BREAKFAST. (Patient taking differently: Take 324 mg by mouth every other day.) 100 tablet 0   gabapentin (NEURONTIN) 300 MG capsule Take 1 capsule by mouth 3 times a day 90 capsule 2   HYDROcodone-acetaminophen (NORCO/VICODIN) 5-325 MG tablet Take 1 tablet by mouth every 6 hours as needed for 5 days 20 tablet 0   hydrOXYzine (ATARAX/VISTARIL) 10 MG tablet Take 10 mg by mouth at bedtime.     lisinopril (ZESTRIL) 10 MG tablet TAKE 1 TABLET BY MOUTH ONCE DAILY EVERY MORNING (Patient taking differently: Take 10 mg by mouth every morning.) 90 tablet 0   lisinopril (ZESTRIL) 10 MG tablet Take 1 tablet by mouth every morning 90 tablet 1   methocarbamol (ROBAXIN) 500 MG tablet Take 1 tablet every 6-8 hours by oral route as needed for spasm for 10 days. 60 tablet 1   methocarbamol (ROBAXIN) 750 MG tablet Take 1 tablet (750 mg total) by mouth every 8 (eight) hours as needed for muscle spasms. 60 tablet 1   Methocarbamol (ROBAXIN-750 PO) Take 750 mg by mouth at bedtime as needed (muscle pain).     mirabegron ER (MYRBETRIQ) 25 MG TB24 tablet Take 25 mg by mouth at bedtime.     mirabegron ER (MYRBETRIQ) 50 MG TB24 tablet Take 50 mg by mouth daily.     Multiple Vitamins-Minerals (ALIVE ONCE DAILY WOMENS PO) Take 1 tablet by mouth daily.     pantoprazole (PROTONIX) 40 MG tablet Please take 1 tablet oral twice daily for first week, then take 1 tablet oral daily. 45 tablet 1   pantoprazole (PROTONIX) 40 MG tablet take 1 tablet by mouth twice a day (Patient taking differently: Take 40 mg by mouth 2 (two) times daily.) 60 tablet 6   rifaximin (XIFAXAN) 550 MG TABS tablet Take 1 tablet (550 mg total) by mouth 3 times daily for 14 days. 42 tablet 0   silver sulfADIAZINE (SILVADENE) 1 % cream Apply 1 application topically 2 (two) times daily as needed (stomach tears). 50 g 0   sodium chloride (OCEAN) 0.65 % SOLN nasal spray Place 1 spray into both  nostrils at bedtime.     solifenacin (VESICARE) 10 MG tablet Take 10 mg by mouth daily.     solifenacin (VESICARE) 10 MG tablet Take 1 tablet by mouth once daily 90 tablet 1   tamoxifen (NOLVADEX) 10 MG tablet Take 1 tablet (10 mg total) by mouth daily. 90 tablet 3   Trolamine Salicylate (BLUE-EMU HEMP EX) Apply 1 application topically daily.     valACYclovir (VALTREX) 500 MG tablet Take 500 mg by mouth 2 (two) times daily as needed (outbreak).     valACYclovir (VALTREX) 500 MG tablet Take 1 tablet by mouth 2 times daily as needed 180 tablet 0   No current facility-administered medications for this visit.    PHYSICAL EXAMINATION: ECOG PERFORMANCE STATUS: 1 - Symptomatic but completely ambulatory  Vitals:   03/22/21 1027  BP: (!) 121/54  Pulse: 65  Resp: 18  Temp: 98.1 F (36.7  C)  SpO2: 100%   Filed Weights   03/22/21 1027  Weight: 206 lb 8 oz (93.7 kg)     LABORATORY DATA:  I have reviewed the data as listed CMP Latest Ref Rng & Units 10/21/2020 10/21/2020 07/30/2020  Glucose 70 - 99 mg/dL 89 90 98  BUN 6 - 20 mg/dL 24(H) 19 8  Creatinine 0.44 - 1.00 mg/dL 0.70 0.63 0.77  Sodium 135 - 145 mmol/L 131(L) 135 137  Potassium 3.5 - 5.1 mmol/L 7.6(HH) 3.7 4.1  Chloride 98 - 111 mmol/L 101 99 103  CO2 22 - 32 mmol/L - 27 27  Calcium 8.9 - 10.3 mg/dL - 9.5 9.3  Total Protein 6.5 - 8.1 g/dL - - -  Total Bilirubin 0.3 - 1.2 mg/dL - - -  Alkaline Phos 38 - 126 U/L - - -  AST 15 - 41 U/L - - -  ALT 0 - 44 U/L - - -    Lab Results  Component Value Date   WBC 4.9 10/21/2020   HGB 12.2 10/21/2020   HCT 36.0 10/21/2020   MCV 95.8 10/21/2020   PLT 178 10/21/2020   NEUTROABS 8.1 (H) 07/28/2020    ASSESSMENT & PLAN:  Malignant neoplasm of lower-outer quadrant of left breast of female, estrogen receptor positive ([place]) 07/26/2016: Left lumpectomy: IDC 1.8 cm, with DCIS, margins negative, 0/2 lymph nodes, ER 80%, PR 20%, HER-2 negative ratio 1.18, Ki-67 60%, T1cN0 stage IA    Oncotype DX score 51 : 10 year risk of recurrence greater than 34%, extremely high risk   Treatment Summary: 1. Adjuvant chemotherapy with Adriamycin/Cytoxan x 4, then weekly Taxol x 12 completed 04/19/17 2. Adjuvant radiation therapy completed on 07/26/2017 3. Adjuvant antiestrogen therapy started 08/28/17 -------------------------------------------------------------------------------------------------------------------------------- Plan: Adj Anti estrogen therapy with Letrozole 2.5 mg daily switched to Anastrozole, bone pain and joint pain even on exemestane Switching to tamoxifen (she will start at 5 mg daily starting 12/03/2020) with the plan to slowly titrate the dosage upwards.   Chemo-induced peripheral neuropathy: resumed gabapentin  Breast cancer surveillance: 1.  Breast exam 07/15/2020: Benign 2.  Mammogram: 03/17/2020: Benign breast density category D (MRI recommended because of high density and family history) 3. Breast MRI 08/29/20: Benign Density Cat C. 4. CT Abd and pelvis 07/28/20: Post surgical changes   Hospitalization: 07/28/2020-07/31/2018: Generalized abdominal pain, enteritis, GI bleeding Hospitalization: 10/21/20-10/22/20: rev shoulder arthroplasty   5 mg of tamoxifen: side effects:  fatigue, joint pains She is willing to continue with the tamoxifen at 5 mg daily. She will take over-the-counter B12 supplement to see if it provides her more energy.  Bone density 11/28/2020: T score -2: Osteopenia: Currently on calcium and vitamin D.  We discussed pros and cons of bisphosphonate therapy and decided to hold off on those at this time.  With tamoxifen on board hopefully her bone density will remain stable in 2 years.   Return to clinic in 1 year for follow-up    No orders of the defined types were placed in this encounter.  The patient has a good understanding of the overall plan. she agrees with it. she will call with any problems that may develop before the next visit  here.  Total time spent: 20 mins including face to face time and time spent for planning, charting and coordination of care  Rulon Eisenmenger, MD, MPH 03/22/2021  I, Thana Ates, am acting as scribe for Dr. Nicholas Lose.  I have reviewed the above documentation for accuracy and  completeness, and I agree with the above.       

## 2021-03-22 ENCOUNTER — Encounter (INDEPENDENT_AMBULATORY_CARE_PROVIDER_SITE_OTHER): Payer: Self-pay | Admitting: Bariatrics

## 2021-03-22 ENCOUNTER — Inpatient Hospital Stay: Payer: Medicare Other | Attending: Hematology and Oncology | Admitting: Hematology and Oncology

## 2021-03-22 ENCOUNTER — Other Ambulatory Visit: Payer: Self-pay

## 2021-03-22 DIAGNOSIS — Z9221 Personal history of antineoplastic chemotherapy: Secondary | ICD-10-CM | POA: Insufficient documentation

## 2021-03-22 DIAGNOSIS — Z79811 Long term (current) use of aromatase inhibitors: Secondary | ICD-10-CM | POA: Diagnosis not present

## 2021-03-22 DIAGNOSIS — C50512 Malignant neoplasm of lower-outer quadrant of left female breast: Secondary | ICD-10-CM

## 2021-03-22 DIAGNOSIS — Z923 Personal history of irradiation: Secondary | ICD-10-CM | POA: Insufficient documentation

## 2021-03-22 DIAGNOSIS — Z170 Estrogen receptor positive status [ER+]: Secondary | ICD-10-CM | POA: Diagnosis not present

## 2021-03-23 ENCOUNTER — Other Ambulatory Visit (HOSPITAL_BASED_OUTPATIENT_CLINIC_OR_DEPARTMENT_OTHER): Payer: Self-pay

## 2021-03-23 ENCOUNTER — Encounter: Payer: Self-pay | Admitting: Hematology and Oncology

## 2021-03-23 ENCOUNTER — Other Ambulatory Visit (INDEPENDENT_AMBULATORY_CARE_PROVIDER_SITE_OTHER): Payer: Self-pay | Admitting: Bariatrics

## 2021-03-23 DIAGNOSIS — E559 Vitamin D deficiency, unspecified: Secondary | ICD-10-CM

## 2021-03-23 MED ORDER — CHOLECALCIFEROL 1.25 MG (50000 UT) PO CAPS
50000.0000 [IU] | ORAL_CAPSULE | ORAL | 0 refills | Status: DC
  Filled 2021-03-23: qty 12, 84d supply, fill #0

## 2021-03-23 NOTE — Telephone Encounter (Signed)
Pt last seen by Dr. Brown.  

## 2021-03-23 NOTE — Telephone Encounter (Signed)
LAST APPOINTMENT DATE: 03/02/21 NEXT APPOINTMENT DATE: 08/01/21   [facility] [address] [place] [address] Phone: [phone] Fax: [phone]  [facility] PR[facility] #[id number] [place], [place] - [place] AT [facility]  [place] [address] Phone: [phone] Fax: [phone]  [name] [address] Phone: [phone] Fax: [phone]  [facility] [place], [place] [address] Phone: [phone] Fax: [phone]  Patient is requesting a refill of the following medications: No prescriptions requested or ordered in this encounter   Date last filled: 01/09/21 Previously prescribed by Dr. Owens Shark  Lab Results      Component                Value               Date                      HGBA1C                   5.7 (H)             11/26/2019                HGBA1C                   5.5                 06/09/2019                HGBA1C                   5.6                 12/17/2018           Lab Results      Component                Value               Date                      LDLCALC                  121                 03/23/2020                CREATININE               0.70                10/21/2020           Lab Results      Component                Value               Date                      VD25OH                   40.7                03/23/2020  VD25OH                   40.5                11/26/2019                VD25OH                   40.6                06/09/2019            BP Readings from Last 3 Encounters: 03/22/21 : (!) 121/54 03/02/21 : 108/67 10/22/20 : 114/85

## 2021-03-27 ENCOUNTER — Other Ambulatory Visit (HOSPITAL_BASED_OUTPATIENT_CLINIC_OR_DEPARTMENT_OTHER): Payer: Self-pay

## 2021-03-27 MED ORDER — INFLUENZA VAC SPLIT QUAD 0.5 ML IM SUSY
PREFILLED_SYRINGE | INTRAMUSCULAR | 0 refills | Status: DC
  Filled 2021-03-27: qty 0.5, 1d supply, fill #0

## 2021-03-27 MED ORDER — HYDROXYZINE HCL 10 MG PO TABS
ORAL_TABLET | ORAL | 0 refills | Status: DC
  Filled 2021-03-27: qty 90, 90d supply, fill #0

## 2021-03-29 ENCOUNTER — Other Ambulatory Visit (HOSPITAL_BASED_OUTPATIENT_CLINIC_OR_DEPARTMENT_OTHER): Payer: Self-pay

## 2021-03-30 ENCOUNTER — Ambulatory Visit
Admission: RE | Admit: 2021-03-30 | Discharge: 2021-03-30 | Disposition: A | Discharge status: Home / Self Care | Payer: Medicare Other | Source: Ambulatory Visit | Attending: Hematology and Oncology | Admitting: Hematology and Oncology

## 2021-03-30 ENCOUNTER — Other Ambulatory Visit (HOSPITAL_BASED_OUTPATIENT_CLINIC_OR_DEPARTMENT_OTHER): Payer: Self-pay

## 2021-03-30 DIAGNOSIS — Z0001 Encounter for general adult medical examination with abnormal findings: Secondary | ICD-10-CM | POA: Diagnosis not present

## 2021-03-30 DIAGNOSIS — I10 Essential (primary) hypertension: Secondary | ICD-10-CM | POA: Diagnosis not present

## 2021-03-30 DIAGNOSIS — E7800 Pure hypercholesterolemia, unspecified: Secondary | ICD-10-CM | POA: Diagnosis not present

## 2021-03-30 DIAGNOSIS — Z1231 Encounter for screening mammogram for malignant neoplasm of breast: Secondary | ICD-10-CM

## 2021-03-30 DIAGNOSIS — R7303 Prediabetes: Secondary | ICD-10-CM | POA: Diagnosis not present

## 2021-03-30 DIAGNOSIS — J4520 Mild intermittent asthma, uncomplicated: Secondary | ICD-10-CM | POA: Diagnosis not present

## 2021-04-07 DIAGNOSIS — M25552 Pain in left hip: Secondary | ICD-10-CM | POA: Diagnosis not present

## 2021-04-07 DIAGNOSIS — M25551 Pain in right hip: Secondary | ICD-10-CM | POA: Diagnosis not present

## 2021-04-25 ENCOUNTER — Other Ambulatory Visit (HOSPITAL_BASED_OUTPATIENT_CLINIC_OR_DEPARTMENT_OTHER): Payer: Self-pay

## 2021-04-25 DIAGNOSIS — R112 Nausea with vomiting, unspecified: Secondary | ICD-10-CM | POA: Diagnosis not present

## 2021-04-25 DIAGNOSIS — R1013 Epigastric pain: Secondary | ICD-10-CM | POA: Diagnosis not present

## 2021-04-25 DIAGNOSIS — Z9884 Bariatric surgery status: Secondary | ICD-10-CM | POA: Diagnosis not present

## 2021-04-25 MED ORDER — FAMOTIDINE 40 MG PO TABS
40.0000 mg | ORAL_TABLET | Freq: Every day | ORAL | 1 refills | Status: DC
  Filled 2021-04-25: qty 30, 30d supply, fill #0
  Filled 2021-05-22: qty 30, 30d supply, fill #1

## 2021-05-12 DIAGNOSIS — Z9884 Bariatric surgery status: Secondary | ICD-10-CM | POA: Diagnosis not present

## 2021-05-12 DIAGNOSIS — Z4802 Encounter for removal of sutures: Secondary | ICD-10-CM | POA: Diagnosis not present

## 2021-05-12 DIAGNOSIS — R112 Nausea with vomiting, unspecified: Secondary | ICD-10-CM | POA: Diagnosis not present

## 2021-05-12 DIAGNOSIS — R1013 Epigastric pain: Secondary | ICD-10-CM | POA: Diagnosis not present

## 2021-05-12 DIAGNOSIS — T182XXA Foreign body in stomach, initial encounter: Secondary | ICD-10-CM | POA: Diagnosis not present

## 2021-05-16 ENCOUNTER — Other Ambulatory Visit (HOSPITAL_BASED_OUTPATIENT_CLINIC_OR_DEPARTMENT_OTHER): Payer: Self-pay

## 2021-05-19 ENCOUNTER — Other Ambulatory Visit (HOSPITAL_BASED_OUTPATIENT_CLINIC_OR_DEPARTMENT_OTHER): Payer: Self-pay

## 2021-05-19 ENCOUNTER — Other Ambulatory Visit (INDEPENDENT_AMBULATORY_CARE_PROVIDER_SITE_OTHER): Payer: Self-pay | Admitting: Bariatrics

## 2021-05-22 ENCOUNTER — Other Ambulatory Visit (HOSPITAL_BASED_OUTPATIENT_CLINIC_OR_DEPARTMENT_OTHER): Payer: Self-pay

## 2021-05-22 MED ORDER — SILVER SULFADIAZINE 1 % EX CREA
TOPICAL_CREAM | CUTANEOUS | 1 refills | Status: DC
  Filled 2021-05-22: qty 400, 30d supply, fill #0
  Filled 2021-08-24: qty 400, 30d supply, fill #1
  Filled 2021-12-05: qty 400, 30d supply, fill #2

## 2021-05-22 MED ORDER — ALBUTEROL SULFATE HFA 108 (90 BASE) MCG/ACT IN AERS
INHALATION_SPRAY | RESPIRATORY_TRACT | 0 refills | Status: DC
  Filled 2021-05-22: qty 8.5, 33d supply, fill #0

## 2021-05-23 ENCOUNTER — Encounter: Payer: Self-pay | Admitting: Hematology and Oncology

## 2021-05-23 ENCOUNTER — Other Ambulatory Visit (HOSPITAL_BASED_OUTPATIENT_CLINIC_OR_DEPARTMENT_OTHER): Payer: Self-pay

## 2021-05-23 MED ORDER — FERROUS GLUCONATE 324 (38 FE) MG PO TABS
ORAL_TABLET | ORAL | 0 refills | Status: DC
  Filled 2021-05-23: qty 100, 100d supply, fill #0

## 2021-05-23 NOTE — Telephone Encounter (Signed)
LAST APPOINTMENT DATE: 03/02/21 NEXT APPOINTMENT DATE: 08/01/20   [facility] [address] [place] [address] Phone: [phone] Fax: [phone]  [facility] #[id number] [place], [place] - [place] AT [facility]  [place] [address] Phone: [phone] Fax: [phone]  [name] [address] Phone: [phone] Fax: [phone]  [facility] [place], [place] [address] Phone: [phone] Fax: [phone]  Patient is requesting a refill of the following medications: Pending Prescriptions:                       Disp   Refills   ferrous gluconate (FERGON) 324 MG tablet   100 ta*0       Sig: TAKE 1 TABLET (324 MG TOTAL) BY MOUTH DAILY WITH          BREAKFAST.   Date last filled: 03/23/21 Previously prescribed by Dr. Owens Shark  Lab Results      Component                Value               Date                      HGBA1C                   5.7 (H)             11/26/2019                HGBA1C                   5.5                 06/09/2019                HGBA1C                   5.6                 12/17/2018           Lab Results      Component                Value               Date                      LDLCALC                  121                 03/23/2020                CREATININE               0.70                10/21/2020           Lab Results      Component                Value               Date  VD25OH                   40.7                03/23/2020                VD25OH                   40.5                11/26/2019                VD25OH                   40.6                06/09/2019            BP Readings from Last 3 Encounters: 03/22/21 : (!) 121/54 03/02/21 : 108/67 10/22/20 :  114/85

## 2021-05-23 NOTE — Telephone Encounter (Signed)
Dr.Brown 

## 2021-05-23 NOTE — Telephone Encounter (Signed)
LAST APPOINTMENT DATE: 03/02/21 NEXT APPOINTMENT DATE: 08/01/21 Pt needs Vit D and Iron, per pt call 05/23/21  [place] [place] [address] [place] [address] Phone: [phone] Fax: [phone]  [facility] #[id number] [place], [place] - [place] AT [facility]  [place] [address] Phone: [phone] Fax: [phone]  [name] [address] Phone: [phone] Fax: [phone]  [facility] STORE [place], [place] [address] Phone: [phone] Fax: [phone]  Patient is requesting a refill of the following medications: Pending Prescriptions:                       Disp   Refills   ferrous gluconate (FERGON) 324 MG tablet   100 ta*0       Sig: TAKE 1 TABLET (324 MG TOTAL) BY MOUTH DAILY WITH          BREAKFAST.   Date last filled: 3/22 Previously prescribed by Dr. Owens Shark  Lab Results      Component                Value               Date                      HGBA1C                   5.7 (H)             11/26/2019                HGBA1C                   5.5                 06/09/2019                HGBA1C                   5.6                 12/17/2018           Lab Results      Component                Value               Date                      LDLCALC                  121                 03/23/2020                CREATININE               0.70                10/21/2020           Lab Results      Component                Value  Date                      VD25OH                   40.7                03/23/2020                VD25OH                   40.5                11/26/2019                VD25OH                   40.6                06/09/2019            BP Readings from Last 3 Encounters: 03/22/21 : (!)  121/54 03/02/21 : 108/67 10/22/20 : 114/85

## 2021-05-25 ENCOUNTER — Other Ambulatory Visit (INDEPENDENT_AMBULATORY_CARE_PROVIDER_SITE_OTHER): Payer: Self-pay | Admitting: Bariatrics

## 2021-05-25 ENCOUNTER — Other Ambulatory Visit (HOSPITAL_BASED_OUTPATIENT_CLINIC_OR_DEPARTMENT_OTHER): Payer: Self-pay

## 2021-05-25 DIAGNOSIS — E559 Vitamin D deficiency, unspecified: Secondary | ICD-10-CM

## 2021-05-25 MED ORDER — SOLIFENACIN SUCCINATE 10 MG PO TABS
10.0000 mg | ORAL_TABLET | Freq: Every day | ORAL | 1 refills | Status: DC
  Filled 2021-05-25 – 2021-07-09 (×2): qty 90, 90d supply, fill #0
  Filled 2021-10-09: qty 90, 90d supply, fill #1

## 2021-05-29 ENCOUNTER — Other Ambulatory Visit (HOSPITAL_BASED_OUTPATIENT_CLINIC_OR_DEPARTMENT_OTHER): Payer: Self-pay

## 2021-05-30 ENCOUNTER — Other Ambulatory Visit (HOSPITAL_BASED_OUTPATIENT_CLINIC_OR_DEPARTMENT_OTHER): Payer: Self-pay

## 2021-05-30 DIAGNOSIS — Z6841 Body Mass Index (BMI) 40.0 and over, adult: Secondary | ICD-10-CM | POA: Diagnosis not present

## 2021-05-30 DIAGNOSIS — M5136 Other intervertebral disc degeneration, lumbar region: Secondary | ICD-10-CM | POA: Diagnosis not present

## 2021-05-30 DIAGNOSIS — M5106 Intervertebral disc disorders with myelopathy, lumbar region: Secondary | ICD-10-CM | POA: Diagnosis not present

## 2021-05-30 DIAGNOSIS — M47816 Spondylosis without myelopathy or radiculopathy, lumbar region: Secondary | ICD-10-CM | POA: Diagnosis not present

## 2021-05-30 MED ORDER — PREDNISONE 5 MG PO TABS
ORAL_TABLET | ORAL | 0 refills | Status: DC
  Filled 2021-05-30: qty 21, 6d supply, fill #0

## 2021-06-04 DIAGNOSIS — M5126 Other intervertebral disc displacement, lumbar region: Secondary | ICD-10-CM | POA: Diagnosis not present

## 2021-06-04 DIAGNOSIS — M47816 Spondylosis without myelopathy or radiculopathy, lumbar region: Secondary | ICD-10-CM | POA: Diagnosis not present

## 2021-06-12 ENCOUNTER — Other Ambulatory Visit (HOSPITAL_BASED_OUTPATIENT_CLINIC_OR_DEPARTMENT_OTHER): Payer: Self-pay

## 2021-06-12 DIAGNOSIS — M5136 Other intervertebral disc degeneration, lumbar region: Secondary | ICD-10-CM | POA: Diagnosis not present

## 2021-06-12 DIAGNOSIS — M47816 Spondylosis without myelopathy or radiculopathy, lumbar region: Secondary | ICD-10-CM | POA: Diagnosis not present

## 2021-06-12 MED ORDER — PREDNISONE 5 MG PO TABS
ORAL_TABLET | ORAL | 0 refills | Status: DC
  Filled 2021-06-12: qty 21, 6d supply, fill #0

## 2021-07-10 ENCOUNTER — Other Ambulatory Visit (HOSPITAL_BASED_OUTPATIENT_CLINIC_OR_DEPARTMENT_OTHER): Payer: Self-pay

## 2021-07-24 ENCOUNTER — Other Ambulatory Visit (HOSPITAL_BASED_OUTPATIENT_CLINIC_OR_DEPARTMENT_OTHER): Payer: Self-pay

## 2021-07-24 ENCOUNTER — Encounter: Payer: Self-pay | Admitting: Hematology and Oncology

## 2021-07-24 ENCOUNTER — Ambulatory Visit (INDEPENDENT_AMBULATORY_CARE_PROVIDER_SITE_OTHER): Payer: Medicare Other | Admitting: Bariatrics

## 2021-07-24 ENCOUNTER — Other Ambulatory Visit: Payer: Self-pay

## 2021-07-24 ENCOUNTER — Encounter (INDEPENDENT_AMBULATORY_CARE_PROVIDER_SITE_OTHER): Payer: Self-pay | Admitting: Bariatrics

## 2021-07-24 VITALS — BP 120/78 | HR 71 | Temp 97.7°F | Ht 61.0 in | Wt 200.0 lb

## 2021-07-24 DIAGNOSIS — R7303 Prediabetes: Secondary | ICD-10-CM | POA: Diagnosis not present

## 2021-07-24 DIAGNOSIS — D508 Other iron deficiency anemias: Secondary | ICD-10-CM | POA: Diagnosis not present

## 2021-07-24 DIAGNOSIS — E669 Obesity, unspecified: Secondary | ICD-10-CM | POA: Diagnosis not present

## 2021-07-24 DIAGNOSIS — Z6837 Body mass index (BMI) 37.0-37.9, adult: Secondary | ICD-10-CM | POA: Diagnosis not present

## 2021-07-24 MED ORDER — FERROUS GLUCONATE 324 (38 FE) MG PO TABS
ORAL_TABLET | ORAL | 0 refills | Status: DC
  Filled 2021-07-24 – 2021-08-24 (×2): qty 100, 100d supply, fill #0

## 2021-07-24 NOTE — Progress Notes (Signed)
? ? ? ?Chief Complaint:  ? ?OBESITY ?Era is here to discuss her progress with her obesity treatment plan along with follow-up of her obesity related diagnoses. [name] is on keeping a food journal and adhering to recommended goals of 1200 calories and 90 grams of protein and states she is following her eating plan approximately 89% of the time. Kaylene states she is dancing for 30 minutes 3 times per week. ? ?Today's visit was #: 25  ?Starting weight: 204 lbs ?Starting date: 02/18/2018 ?Today's weight: 200 lbs ?Today's date: 07/24/2021 ?Total lbs lost to date: 4 lbs ?Total lbs lost since last in-office visit: 8 lbs ? ?Interim History: [name] is down another 8 lbs since her last visit. She is following some good principles.  ? ?Subjective:  ? ?1. Other iron deficiency anemia ?[name] is taking her medications as directed.  ? ?2. Pre-diabetes ?[name] is not on medications currently. ? ?Assessment/Plan:  ? ?1. Other iron deficiency anemia ?Orders and follow up as documented in patient record. We will refill Fergon 324 mg for 1 month with no refills. She will get labs work per her primary care physician next month.  ? ?Counseling ?Iron is essential for our bodies to make red blood cells.  Reasons that someone may be deficient include: an iron-deficient diet (more likely in those following vegan or vegetarian diets), women with heavy menses, patients with GI disorders or poor absorption, patients that have had bariatric surgery, frequent blood donors, patients with cancer, and patients with heart disease.   ?An iron supplement has been recommended. This is found over-the-counter.  ?Iron-rich foods include dark leafy greens, red and white meats, eggs, seafood, and beans.   ?Certain foods and drinks prevent your body from absorbing iron properly. Avoid eating these foods in the same meal as iron-rich foods or with iron supplements. These foods include: coffee, black tea, and red wine; milk, dairy products, and  foods that are high in calcium; beans and soybeans; whole grains.  ?Constipation can be a side effect of iron supplementation. Increased water and fiber intake are helpful. Water goal: > 2 liters/day. Fiber goal: > 25 grams/day.  ? ?- ferrous gluconate (FERGON) 324 MG tablet; TAKE 1 TABLET (324 MG TOTAL) BY MOUTH DAILY WITH BREAKFAST.  Dispense: 100 tablet; Refill: 0 ? ?2. Pre-diabetes ?[name] will continue to stay on plan and increase activity. She will continue to work on weight loss, exercise, and decreasing simple carbohydrates to help decrease the risk of diabetes.  ? ?3. Obesity, current BMI 37.8 ?[name] is currently in the action stage of change. As such, her goal is to continue with weight loss efforts. She has agreed to keeping a food journal and adhering to recommended goals of 1200 calories and 90 grams of protein.  ? ?[name] will continue to journal. She will adhere closely to the plan 80-90%. ? ?Exercise goals:  As is. ? ?Behavioral modification strategies: increasing lean protein intake, decreasing simple carbohydrates, increasing vegetables, increasing water intake, decreasing eating out, no skipping meals, meal planning and cooking strategies, keeping healthy foods in the home, and planning for success. ? ?[name] has agreed to follow-up with our clinic in 5 weeks. She was informed of the importance of frequent follow-up visits to maximize her success with intensive lifestyle modifications for her multiple health conditions.  ? ?Objective:  ? ?Blood pressure 120/78, pulse 71, temperature 97.7 ?F (36.5 ?C), height '5\' 1"'$  (1.549 m), weight 200 lb (90.7 kg), SpO2 99 %. ?Body mass index is 37.79  kg/m?. ? ?General: Cooperative, alert, well developed, in no acute distress. ?HEENT: Conjunctivae and lids unremarkable. ?Cardiovascular: Regular rhythm.  ?Lungs: Normal work of breathing. ?Neurologic: No focal deficits.  ? ?Lab Results  ?Component Value Date  ? CREATININE 0.70 10/21/2020  ? BUN 24 (H)  10/21/2020  ? NA 131 (L) 10/21/2020  ? K 7.6 (HH) 10/21/2020  ? CL 101 10/21/2020  ? CO2 27 10/21/2020  ? ?Lab Results  ?Component Value Date  ? ALT 16 07/29/2020  ? AST 17 07/29/2020  ? ALKPHOS 57 07/29/2020  ? BILITOT 0.8 07/29/2020  ? ?Lab Results  ?Component Value Date  ? HGBA1C 5.7 (H) 11/26/2019  ? HGBA1C 5.5 06/09/2019  ? HGBA1C 5.6 12/17/2018  ? HGBA1C 5.7 (H) 06/10/2018  ? HGBA1C 5.8 (H) 02/18/2018  ? ?Lab Results  ?Component Value Date  ? INSULIN 3.8 11/26/2019  ? INSULIN 7.0 06/09/2019  ? INSULIN 4.9 02/18/2018  ? ?Lab Results  ?Component Value Date  ? TSH 0.44 03/23/2020  ? ?Lab Results  ?Component Value Date  ? CHOL 215 (A) 03/23/2020  ? HDL 137 (A) 03/23/2020  ? Coin 121 03/23/2020  ? TRIG 71 03/23/2020  ? CHOLHDL 2.5 11/07/2018  ? ?Lab Results  ?Component Value Date  ? VD25OH 40.7 03/23/2020  ? VD25OH 40.5 11/26/2019  ? VD25OH 40.6 06/09/2019  ? ?Lab Results  ?Component Value Date  ? WBC 4.9 10/21/2020  ? HGB 12.2 10/21/2020  ? HCT 36.0 10/21/2020  ? MCV 95.8 10/21/2020  ? PLT 178 10/21/2020  ? ?No results found for: IRON, TIBC, FERRITIN ? ?Attestation Statements:  ? ?Reviewed by clinician on day of visit: allergies, medications, problem list, medical history, surgical history, family history, social history, and previous encounter notes. ? ?I, Lizbeth Bark, RMA, am acting as transcriptionist for [employer], DO. ? ?I have reviewed the above documentation for accuracy and completeness, and I agree with the above. Jearld Lesch, DO ? ?

## 2021-07-25 ENCOUNTER — Other Ambulatory Visit (HOSPITAL_BASED_OUTPATIENT_CLINIC_OR_DEPARTMENT_OTHER): Payer: Self-pay

## 2021-07-27 ENCOUNTER — Encounter (INDEPENDENT_AMBULATORY_CARE_PROVIDER_SITE_OTHER): Payer: Self-pay | Admitting: Bariatrics

## 2021-07-31 DIAGNOSIS — M47816 Spondylosis without myelopathy or radiculopathy, lumbar region: Secondary | ICD-10-CM | POA: Diagnosis not present

## 2021-08-01 ENCOUNTER — Ambulatory Visit (INDEPENDENT_AMBULATORY_CARE_PROVIDER_SITE_OTHER): Payer: Medicare Other | Admitting: Bariatrics

## 2021-08-08 ENCOUNTER — Other Ambulatory Visit (HOSPITAL_BASED_OUTPATIENT_CLINIC_OR_DEPARTMENT_OTHER): Payer: Self-pay

## 2021-08-16 DIAGNOSIS — M47816 Spondylosis without myelopathy or radiculopathy, lumbar region: Secondary | ICD-10-CM | POA: Diagnosis not present

## 2021-08-22 ENCOUNTER — Other Ambulatory Visit (HOSPITAL_BASED_OUTPATIENT_CLINIC_OR_DEPARTMENT_OTHER): Payer: Self-pay

## 2021-08-22 DIAGNOSIS — Z9884 Bariatric surgery status: Secondary | ICD-10-CM | POA: Diagnosis not present

## 2021-08-22 DIAGNOSIS — R1013 Epigastric pain: Secondary | ICD-10-CM | POA: Diagnosis not present

## 2021-08-22 DIAGNOSIS — K6389 Other specified diseases of intestine: Secondary | ICD-10-CM | POA: Diagnosis not present

## 2021-08-22 MED ORDER — FAMOTIDINE 40 MG PO TABS
40.0000 mg | ORAL_TABLET | Freq: Every day | ORAL | 1 refills | Status: DC
  Filled 2021-08-22: qty 90, 90d supply, fill #0

## 2021-08-22 MED ORDER — PANTOPRAZOLE SODIUM 40 MG PO TBEC
DELAYED_RELEASE_TABLET | ORAL | 1 refills | Status: DC
  Filled 2021-08-22: qty 180, 90d supply, fill #0

## 2021-08-22 MED ORDER — XIFAXAN 550 MG PO TABS
ORAL_TABLET | ORAL | 0 refills | Status: DC
  Filled 2021-08-22: qty 42, 14d supply, fill #0

## 2021-08-24 ENCOUNTER — Other Ambulatory Visit (INDEPENDENT_AMBULATORY_CARE_PROVIDER_SITE_OTHER): Payer: Self-pay | Admitting: Bariatrics

## 2021-08-24 ENCOUNTER — Encounter: Payer: Self-pay | Admitting: Hematology and Oncology

## 2021-08-24 ENCOUNTER — Other Ambulatory Visit (HOSPITAL_BASED_OUTPATIENT_CLINIC_OR_DEPARTMENT_OTHER): Payer: Self-pay

## 2021-08-24 DIAGNOSIS — M545 Low back pain, unspecified: Secondary | ICD-10-CM | POA: Diagnosis not present

## 2021-08-24 DIAGNOSIS — E559 Vitamin D deficiency, unspecified: Secondary | ICD-10-CM

## 2021-08-24 MED ORDER — VALACYCLOVIR HCL 500 MG PO TABS
500.0000 mg | ORAL_TABLET | Freq: Two times a day (BID) | ORAL | 0 refills | Status: DC | PRN
  Filled 2021-08-24: qty 180, 90d supply, fill #0

## 2021-08-24 MED ORDER — HYDROXYZINE HCL 10 MG PO TABS
10.0000 mg | ORAL_TABLET | Freq: Every evening | ORAL | 0 refills | Status: DC | PRN
  Filled 2021-08-24: qty 90, 90d supply, fill #0

## 2021-08-24 MED ORDER — LISINOPRIL 10 MG PO TABS
10.0000 mg | ORAL_TABLET | Freq: Every morning | ORAL | 1 refills | Status: DC
  Filled 2021-08-24: qty 90, 90d supply, fill #0
  Filled 2021-12-05: qty 90, 90d supply, fill #1

## 2021-08-24 MED ORDER — ALBUTEROL SULFATE HFA 108 (90 BASE) MCG/ACT IN AERS
1.0000 | INHALATION_SPRAY | RESPIRATORY_TRACT | 1 refills | Status: DC | PRN
  Filled 2021-08-24: qty 8.5, 30d supply, fill #0
  Filled 2021-12-05: qty 8.5, 30d supply, fill #1

## 2021-08-24 MED ORDER — HYDROCODONE-ACETAMINOPHEN 5-325 MG PO TABS
1.0000 | ORAL_TABLET | Freq: Four times a day (QID) | ORAL | 0 refills | Status: DC
  Filled 2021-08-24: qty 30, 7d supply, fill #0

## 2021-08-25 ENCOUNTER — Other Ambulatory Visit (HOSPITAL_BASED_OUTPATIENT_CLINIC_OR_DEPARTMENT_OTHER): Payer: Self-pay

## 2021-08-28 ENCOUNTER — Other Ambulatory Visit (HOSPITAL_BASED_OUTPATIENT_CLINIC_OR_DEPARTMENT_OTHER): Payer: Self-pay

## 2021-08-29 ENCOUNTER — Other Ambulatory Visit (HOSPITAL_BASED_OUTPATIENT_CLINIC_OR_DEPARTMENT_OTHER): Payer: Self-pay

## 2021-08-30 ENCOUNTER — Other Ambulatory Visit (HOSPITAL_BASED_OUTPATIENT_CLINIC_OR_DEPARTMENT_OTHER): Payer: Self-pay

## 2021-08-30 MED ORDER — PREDNISONE 10 MG PO TABS
ORAL_TABLET | ORAL | 0 refills | Status: DC
  Filled 2021-08-30: qty 21, 6d supply, fill #0

## 2021-09-06 DIAGNOSIS — Z4789 Encounter for other orthopedic aftercare: Secondary | ICD-10-CM | POA: Diagnosis not present

## 2021-09-14 DIAGNOSIS — M25511 Pain in right shoulder: Secondary | ICD-10-CM | POA: Diagnosis not present

## 2021-09-19 ENCOUNTER — Other Ambulatory Visit (HOSPITAL_BASED_OUTPATIENT_CLINIC_OR_DEPARTMENT_OTHER): Payer: Self-pay

## 2021-09-19 MED ORDER — DIAZEPAM 5 MG PO TABS
ORAL_TABLET | ORAL | 1 refills | Status: DC
  Filled 2021-09-19: qty 2, 2d supply, fill #0

## 2021-09-20 ENCOUNTER — Other Ambulatory Visit (HOSPITAL_BASED_OUTPATIENT_CLINIC_OR_DEPARTMENT_OTHER): Payer: Self-pay

## 2021-09-25 DIAGNOSIS — M47816 Spondylosis without myelopathy or radiculopathy, lumbar region: Secondary | ICD-10-CM | POA: Diagnosis not present

## 2021-09-27 IMAGING — MG DIGITAL DIAGNOSTIC BILAT W/ TOMO W/ CAD
8 series · 8 of 20 positions shown · non-contrast
Comparison: Previous exam(s).

CLINICAL DATA: 58-year-old female presenting for routine annual
surveillance status post left breast lumpectomy in 8541. The patient
has family history of breast cancer in her mother, 2 sisters and a
paternal aunt.

EXAM:
DIGITAL DIAGNOSTIC BILATERAL MAMMOGRAM WITH CAD AND TOMO

[L MLO]
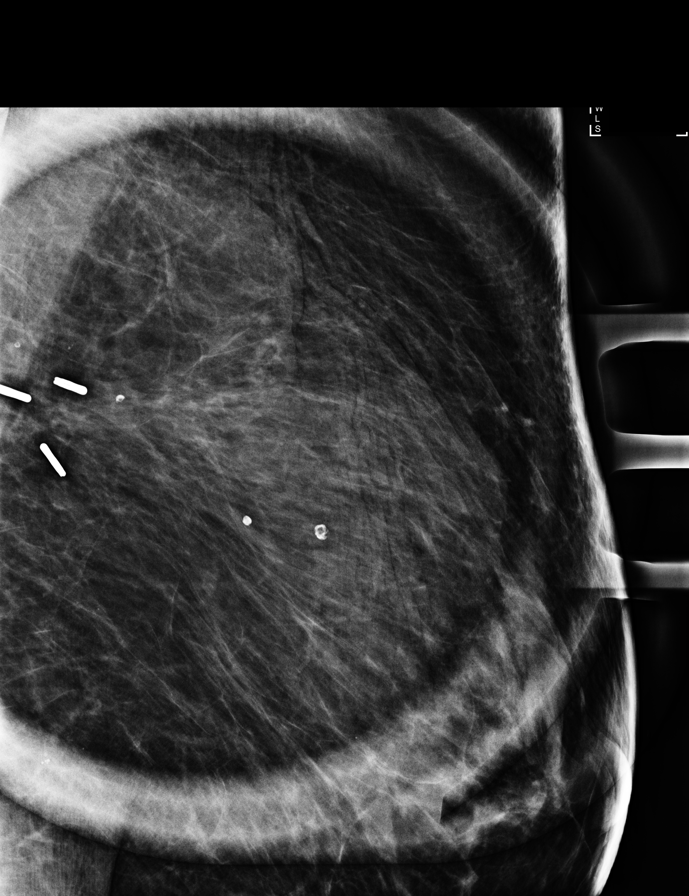

[R MLO synth-2D]
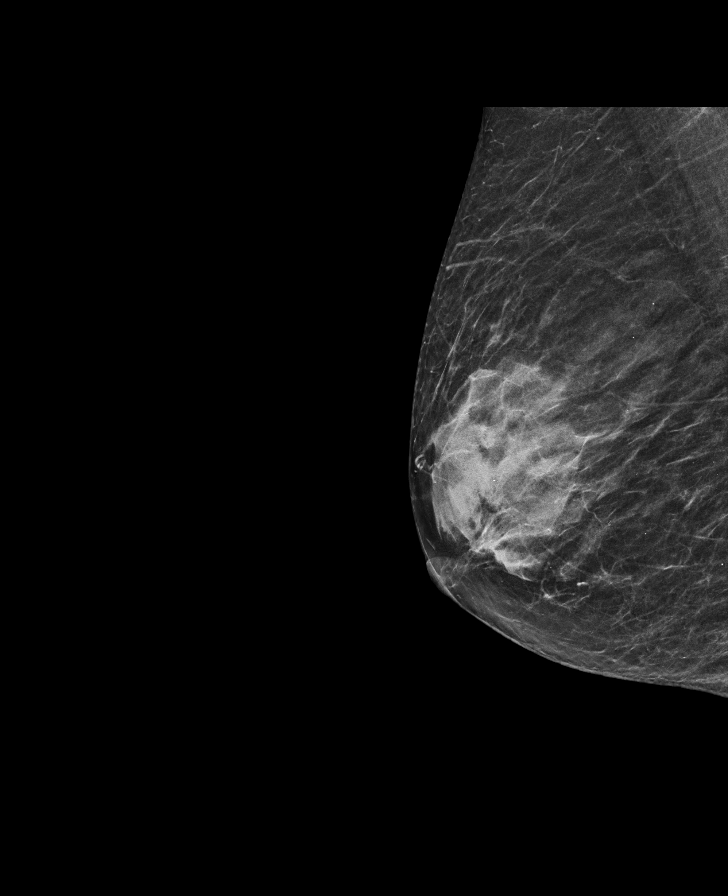

[L MLO synth-2D]
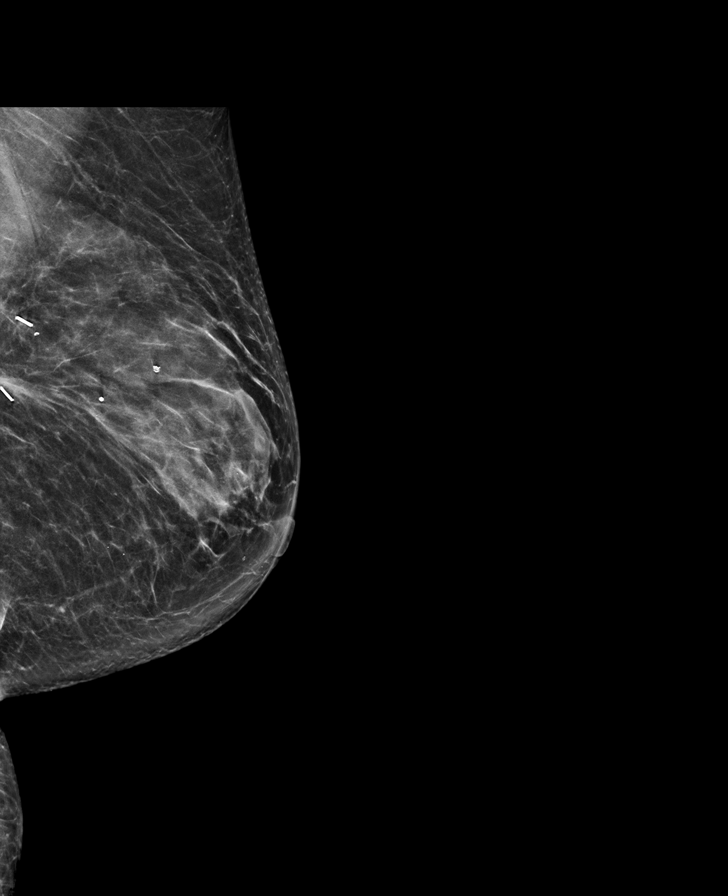

[R CC synth-2D]
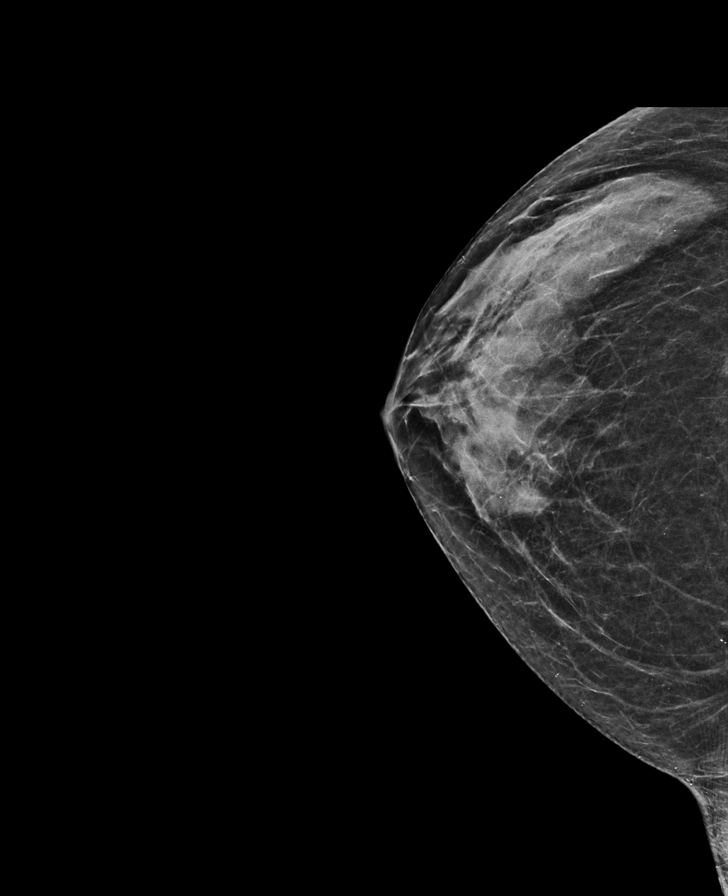

[L CC synth-2D]
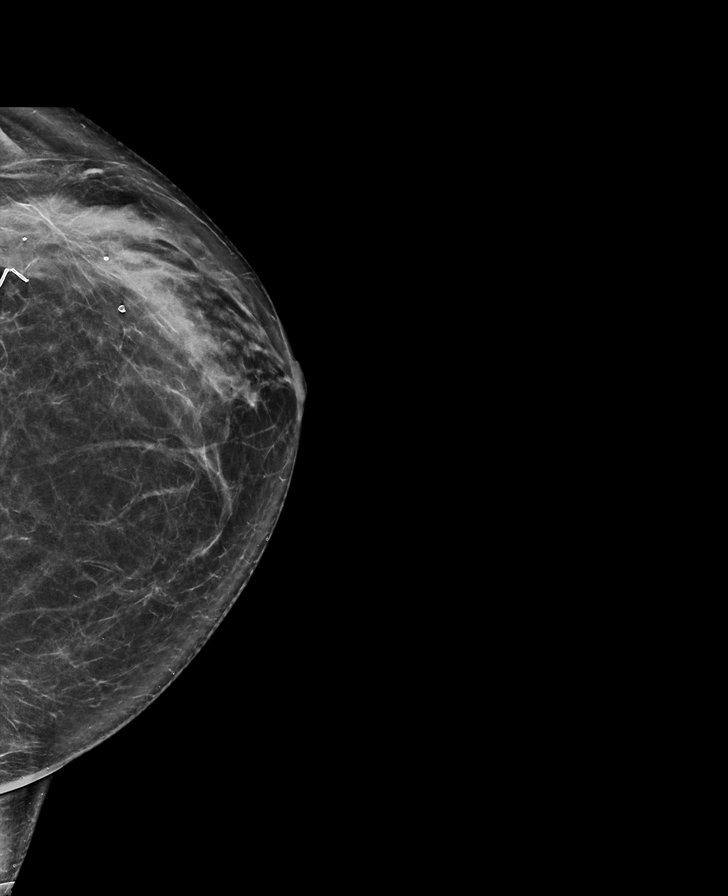

[L MLO tomo · tomo slice 36/71.0]
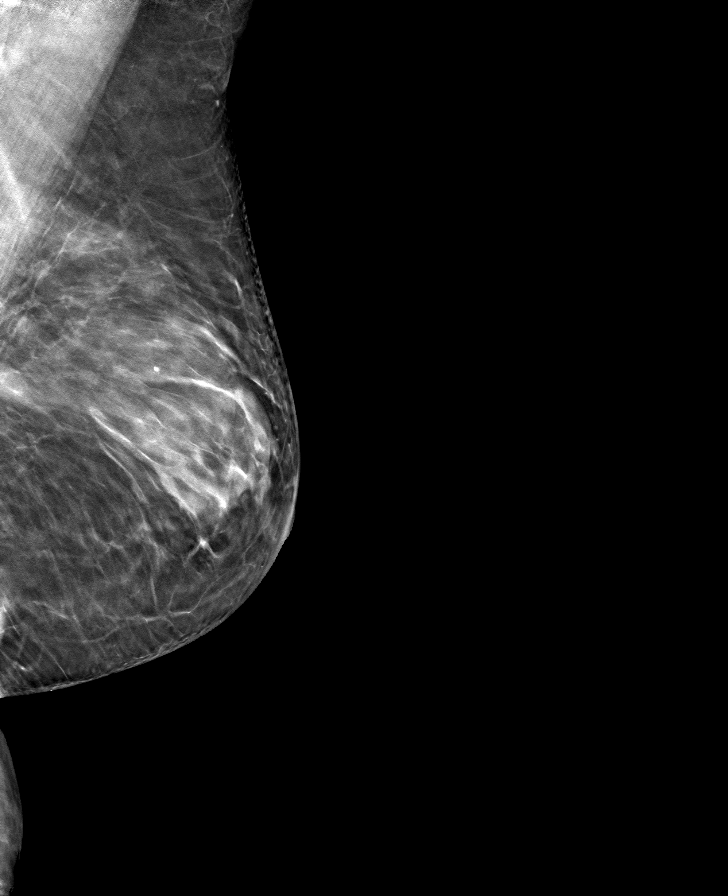

[R CC tomo · tomo slice 28/55.0]
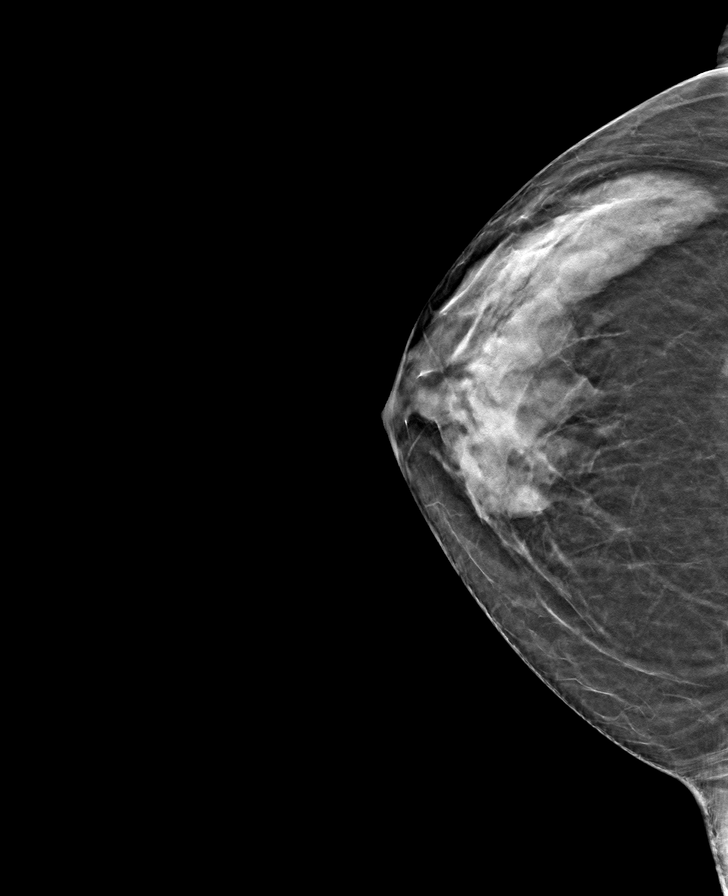

[L CC tomo · tomo slice 36/71.0]
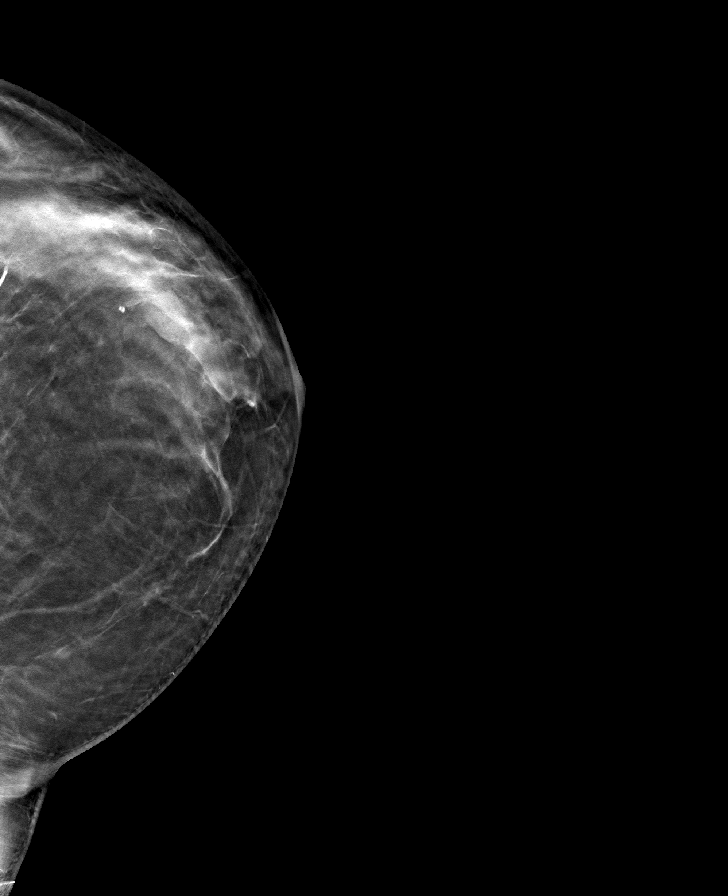

[8 of 20 positions shown; findings below may reference images not displayed]

ACR Breast Density Category d: The breast tissue is extremely dense,
which lowers the sensitivity of mammography.
FINDINGS: The left breast lumpectomy site is stable. No suspicious
calcifications, masses or areas of distortion are seen in the
bilateral breasts.

Mammographic images were processed with CAD.
IMPRESSION: Stable left breast lumpectomy site. No mammographic evidence of
malignancy in the bilateral breasts.

RECOMMENDATION:
1.  Diagnostic mammogram is suggested in 1 year. (Code:C1-X-6DX)

2. Given the very strong family history of breast cancer, personal
history of breast cancer, and extreme density of her breast tissue
annual surveillance with MRI in addition to mammography is
recommended.

I have discussed the findings and recommendations with the patient.
If applicable, a reminder letter will be sent to the patient
regarding the next appointment.

BI-RADS CATEGORY  2: Benign.

## 2021-09-28 DIAGNOSIS — M25511 Pain in right shoulder: Secondary | ICD-10-CM | POA: Diagnosis not present

## 2021-10-04 ENCOUNTER — Encounter: Payer: Self-pay | Admitting: Hematology and Oncology

## 2021-10-04 ENCOUNTER — Other Ambulatory Visit (HOSPITAL_BASED_OUTPATIENT_CLINIC_OR_DEPARTMENT_OTHER): Payer: Self-pay

## 2021-10-04 DIAGNOSIS — E7800 Pure hypercholesterolemia, unspecified: Secondary | ICD-10-CM | POA: Diagnosis not present

## 2021-10-04 DIAGNOSIS — I6789 Other cerebrovascular disease: Secondary | ICD-10-CM | POA: Diagnosis not present

## 2021-10-04 DIAGNOSIS — I10 Essential (primary) hypertension: Secondary | ICD-10-CM | POA: Diagnosis not present

## 2021-10-04 DIAGNOSIS — I700 Atherosclerosis of aorta: Secondary | ICD-10-CM | POA: Diagnosis not present

## 2021-10-04 MED ORDER — VITAMIN D (ERGOCALCIFEROL) 50000 UNITS PO CAPS
ORAL_CAPSULE | ORAL | 3 refills | Status: DC
  Filled 2021-10-04: qty 6, 90d supply, fill #0

## 2021-10-05 ENCOUNTER — Ambulatory Visit: Payer: Medicare Other | Admitting: Cardiovascular Disease

## 2021-10-05 ENCOUNTER — Encounter: Payer: Self-pay | Admitting: Cardiovascular Disease

## 2021-10-05 VITALS — BP 118/68 | HR 55 | Ht 61.0 in | Wt 202.8 lb

## 2021-10-05 DIAGNOSIS — Z86711 Personal history of pulmonary embolism: Secondary | ICD-10-CM

## 2021-10-05 DIAGNOSIS — Z8673 Personal history of transient ischemic attack (TIA), and cerebral infarction without residual deficits: Secondary | ICD-10-CM | POA: Diagnosis not present

## 2021-10-05 DIAGNOSIS — Z95828 Presence of other vascular implants and grafts: Secondary | ICD-10-CM

## 2021-10-05 DIAGNOSIS — I872 Venous insufficiency (chronic) (peripheral): Secondary | ICD-10-CM

## 2021-10-05 DIAGNOSIS — Q2112 Patent foramen ovale: Secondary | ICD-10-CM | POA: Diagnosis not present

## 2021-10-05 DIAGNOSIS — E7800 Pure hypercholesterolemia, unspecified: Secondary | ICD-10-CM | POA: Diagnosis not present

## 2021-10-05 DIAGNOSIS — I700 Atherosclerosis of aorta: Secondary | ICD-10-CM

## 2021-10-05 NOTE — Progress Notes (Signed)
Cardiology Office Note:    Date:  10/09/2021   ID:  [name], DOB 1961-03-24, MRN [id number]  PCP:  [name], MD  Cardiologist:  Sanda Klein, MD  Electrophysiologist:  None   Referring MD: [name], MD   Chief Complaint  Patient presents with   Pacemaker Check  History of pulmonary embolism, paradoxical embolism stroke, inferior vena cava filter  History of Present Illness:    [name] is a 61 y.o. 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  She has normal left ventricular systolic function with "moderate diastolic dysfunction" by echo in May 2018 (on my review I would have said that she has normal diastolic function), normal coronary arteries by cardiac catheterization in 2003, normal nuclear stress test in 2017, normal ABIs in 2012.  Abdominal CT in 2012 did show moderate atheromatous calcification in the infrarenal aorta, without any significant arterial obstruction in the lower extremities and three-vessel runoff bilaterally.  I am not entirely sure, but it seems that the inferior vena cava filter was implanted prophylactically before her gastric bypass surgery since she had a history of pulmonary embolism, paradoxical stroke and upper extremity DVT.  She was on anticoagulation with warfarin until 2009, only on aspirin since then without any recurrent arterial or venous thromboembolic events.  She is doing well from a cardiac point of view.  She denies exertional angina or dyspnea but remains fairly sedentary due to orthopedic problems.  She has not had edema, orthopnea, PND, palpitations, dizziness  or syncope.  Issues with postprandial nausea and thinks she may be having dumping syndrome.  She has random episodes of heartburn, not associated with physical activity.  She had gastric bypass surgery in 2009.  She has never had diabetes mellitus.  Her most recent LDL cholesterol was 128, similar to last year.  She does not smoke and has never done so.   Past Medical History:  Diagnosis Date   Arthritis    osteoarthritis   Asthma    states no asthma attack since 2002   Back pain    Breast cancer ([facility] [place]) 06/26/16 bx   left breast   Breast cancer ([facility])    Chronic back pain    Complication of anesthesia    states takes more than normal to put her to sleep   Constipation    Dental bridge present    upper   Dental crowns present    DVT of upper extremity (deep vein thrombosis) ([place])    Fibromyalgia    Genetic testing 09/19/2016   Ms. Gerken underwent genetic counseling and testing for hereditary cancer syndromes on 08/21/2016. Her results were negative for mutations in all 46 genes analyzed by [facility]'s 46-gene Common Hereditary Cancers Panel. Genes analyzed include: APC, ATM, AXIN2, BARD1, BMPR1A, BRCA1, BRCA2, BRIP1, CDH1, CDKN2A, CHEK2, CTNNA1, DICER1, EPCAM, GREM1, HOXB13, KIT, MEN1, MLH1, MSH2, MSH3, MSH6, MUTYH, NBN,    GERD (gastroesophageal reflux disease)    History of blood transfusion 06/2005   History of gallstones    History of pneumonia    History of shingles 07/2011   History of shingles    HTN (hypertension)    Itching    Joint pain  Muscle weakness    Neuropathy    Normal coronary arteries 2003   Osteoarthropathy    Personal history of chemotherapy 2018   Personal history of radiation therapy 2018   PFO (patent foramen ovale)    Pneumonia    HX OF    Presence of inferior vena cava filter    Pulmonary embolism ([place])    Sleep apnea    NOT SINCE 2009    Status post gastric bypass for obesity    Stomach pain    Stroke ([place]) 12/2002   right-sided weakness    Urinary incontinence     Past Surgical History:  Procedure Laterality Date   ABDOMINAL HYSTERECTOMY  11/1997   complete   ANAL RECTAL MANOMETRY N/A 01/21/2019   Procedure: ANO RECTAL MANOMETRY;  Surgeon: Arta Silence, MD;  Location: [facility];  Service: Endoscopy;  Laterality: N/A;   ANTERIOR CERVICAL DECOMP/DISCECTOMY FUSION  02/05/2005   C5-6   APPENDECTOMY  10/22/2008   laparoscopic   BREAST LUMPECTOMY Left 07/2016   BREAST LUMPECTOMY WITH RADIOACTIVE SEED AND SENTINEL LYMPH NODE BIOPSY Left 07/26/2016   Procedure: BREAST LUMPECTOMY WITH RADIOACTIVE SEED AND SENTINEL LYMPH NODE BIOPSY;  Surgeon: Erroll Luna, MD;  Location: [facility] [place];  Service: General;  Laterality: Left;   BUNIONECTOMY  05/1980   both feet   BUNIONECTOMY  08/2011   left foot   CARDIAC CATHETERIZATION  03/04/2002   CARPAL TUNNEL RELEASE  06/21/2009   right   CARPAL TUNNEL RELEASE     left hand   CARPAL TUNNEL RELEASE  10/03/2011   Procedure: CARPAL TUNNEL RELEASE;  Surgeon: Wynonia Sours, MD;  Location: [facility];  Service: Orthopedics;  Laterality: Right;  CARPAL TUNNEL WITH HYPOTHENAR FAT PAD TRANSFER   CERVICAL SPINE SURGERY  01/2005   titanium plate implanted   CHOLECYSTECTOMY  1990   ELBOW SURGERY  08/09/2004   decompression ulnar nerve right elbow   ENTEROLYSIS  10/22/2008   laparoscopic abd. enterolysis   GASTRIC ROUX-EN-Y  2009   HEEL SPUR SURGERY  08/1997   left   HEMORRHOID SURGERY  03/1993   LAPAROSCOPIC LYSIS INTESTINAL ADHESIONS  02/14/2000   NAILBED REPAIR  01/10/2005; 08/2011   exc. matrix bilat. great toe   OTHER SURGICAL HISTORY  12/1986   pt states that she had surgery to unclog her fallopean tubes   PORTACATH PLACEMENT N/A 09/24/2016   Procedure: INSERTION PORT-A-CATH WITH [name];  Surgeon: Erroll Luna, MD;  Location: [facility];  Service: General;  Laterality: N/A;   REVERSE SHOULDER ARTHROPLASTY Left 10/21/2020   Procedure: REVERSE SHOULDER ARTHROPLASTY;  Surgeon: Netta Cedars, MD;  Location: [facility];  Service: Orthopedics;  Laterality: Left;  interscalene block   SHOULDER SURGERY     bilat. - (left:  06/2005)   TONSILLECTOMY  07/1995   TRIGGER FINGER RELEASE  04/25/2006   decompression A-1 pulley left thumb   TRIGGER FINGER RELEASE Right 11/11/2018   Procedure: RELEASE TRIGGER FINGER/A-1 PULLEY RIGHT THUMB, RIGHT RING FINGER;  Surgeon: Daryll Brod, MD;  Location: [facility];  Service: Orthopedics;  Laterality: Right;   UTERINE FIBROID SURGERY  12/95, 7/96   x2   VENA CAVA FILTER PLACEMENT  2009   during Roux-en-Y surg.    Current Medications: Current Meds  Medication Sig   albuterol (VENTOLIN HFA) 108 (90 Base) MCG/ACT inhaler Inhale 1 puff by mouth into the lungs every 4 (four) hours as needed.   aspirin EC 81 MG tablet  Take 81 mg by mouth daily.   Calcium Citrate-Vitamin D (CALCIUM CITRATE +D PO) Take 2 tablets by mouth daily. Gummies   celecoxib (CELEBREX) 200 MG capsule Take 1 capsule by mouth every day   celecoxib (CELEBREX) 200 MG capsule Take 1 capsule by mouth everyday   Cholecalciferol 1.25 MG (50000 UT) capsule Take 1 capsule (50,000 Units total) by mouth once a week.   COVID-19 mRNA vaccine, Moderna, 100 MCG/0.5ML injection Inject into the muscle.   diazepam (VALIUM) 5 MG tablet take 1-2 tablets by mouth 30-28mns prior to procedure   docusate sodium (COLACE) 100 MG capsule Take 100 mg by mouth 2 (two) times daily.   famotidine (PEPCID) 40 MG tablet Take 1 tablet (40 mg total) by mouth at bedtime.   ferrous gluconate (FERGON) 324 MG tablet TAKE 1 TABLET (324 MG TOTAL) BY MOUTH DAILY WITH BREAKFAST.   hydrOXYzine (ATARAX) 10 MG tablet Take 1 tablet (10 mg total) by mouth at bedtime as needed for itching.   hydrOXYzine (ATARAX/VISTARIL) 10 MG tablet Take 10 mg by mouth at bedtime.   influenza vac split quadrivalent PF (FLUARIX) 0.5 ML injection Inject into the muscle.   lisinopril (ZESTRIL) 10 MG tablet Take 1 tablet by mouth every  morning   methocarbamol (ROBAXIN) 500 MG tablet Take 1 tablet every 6-8 hours by oral route as needed for spasm for 10 days.   methocarbamol (ROBAXIN) 750 MG tablet Take 1 tablet (750 mg total) by mouth every 8 (eight) hours as needed for muscle spasms.   Methocarbamol (ROBAXIN-750 PO) Take 750 mg by mouth at bedtime as needed (muscle pain).   Multiple Vitamins-Minerals (ALIVE ONCE DAILY WOMENS PO) Take 1 tablet by mouth daily.   pantoprazole (PROTONIX) 40 MG tablet Please take 1 tablet oral twice daily for first week, then take 1 tablet oral daily.   pantoprazole (PROTONIX) 40 MG tablet take 1 tablet by mouth twice a day (Patient taking differently: Take 40 mg by mouth 2 (two) times daily.)   pantoprazole (PROTONIX) 40 MG tablet Take 1 tablet by mouth twice a day before meals   silver sulfADIAZINE (SILVADENE) 1 % cream Apply 1 application topically 2 (two) times daily as needed (stomach tears).   silver sulfADIAZINE (SILVADENE) 1 % cream Apply 1 application to affected area externally once a day   sodium chloride (OCEAN) 0.65 % SOLN nasal spray Place 1 spray into both nostrils at bedtime.   solifenacin (VESICARE) 10 MG tablet Take 10 mg by mouth daily.   solifenacin (VESICARE) 10 MG tablet Take 1 tablet by mouth once daily   tamoxifen (NOLVADEX) 10 MG tablet Take 1 tablet (10 mg total) by mouth daily.   Trolamine Salicylate (BLUE-EMU HEMP EX) Apply 1 application topically daily.   valACYclovir (VALTREX) 500 MG tablet Take 500 mg by mouth 2 (two) times daily as needed (outbreak).   valACYclovir (VALTREX) 500 MG tablet Take 1 tablet (500 mg total) by mouth 2 (two) times daily as needed.   Vitamin D, Ergocalciferol, 50000 units CAPS Take 1 capsule by mouth once every 2 weeks     Allergies:   Aspirin, Oxycodone hcl, Propoxyphene n-acetaminophen, Tramadol, Adhesive [tape], and Prednisone   Social History   Socioeconomic History   Marital status: Single    Spouse name: Not on file   Number of  children: 0   Years of education: Not on file   Highest education level: Not on file  Occupational History   Occupation: disabled  Tobacco Use  Smoking status: Former   Smokeless tobacco: Never   Tobacco comments:    quit smoking 08/1989  Vaping Use   Vaping Use: Never used  Substance and Sexual Activity   Alcohol use: No   Drug use: No   Sexual activity: Not Currently    Birth control/protection: Surgical  Other Topics Concern   Not on file  Social History Narrative   Not on file   Social Determinants of Health   Financial Resource Strain: Not on file  Food Insecurity: Not on file  Transportation Needs: Not on file  Physical Activity: Not on file  Stress: Not on file  Social Connections: Not on file     Family History: The patient's family history includes Breast cancer in her mother and sister; Breast cancer (age of onset: 45) in her paternal aunt; Breast cancer (age of onset: 74) in her sister; Cervical cancer (age of onset: 33) in her paternal grandmother; Colon polyps in her father; Diabetes in her father; High blood pressure in her father; Hypertension in her mother and another family member; Ovarian cancer (age of onset: 40) in her maternal grandmother; Prostate cancer in her father.  ROS:   Please see the history of present illness.    All other systems are reviewed and are negative.  EKGs/Labs/Other Studies Reviewed:    The following studies were reviewed today: Reviewed echo from 2017, nuclear stress test 2017, lower extremity arterial Dopplers 2012, multiple CT studies including CT of the abdomen, CT of the chest, CTs of the brain and MRI of the brain in 2004  EKG:  EKG is ordered today and shows sinus bradycardia, a single PVC, generalized low voltage (probably due to obesity) and nonspecific T wave changes in V4-V5.  Normal QTc 436 ms Recent Labs: 10/21/2020: BUN 24; Creatinine, Ser 0.70; Hemoglobin 12.2; Platelets 178; Potassium 7.6; Sodium 131  Recent Lipid  Panel    Component Value Date/Time   CHOL 215 (A) 03/23/2020 0000   CHOL 218 (H) 11/26/2019 1359   TRIG 71 03/23/2020 0000   HDL 137 (A) 03/23/2020 0000   HDL 82 11/26/2019 1359   CHOLHDL 2.5 11/07/2018 0914   CHOLHDL 2.6 09/30/2015 1838   VLDL 12 09/30/2015 1838   LDLCALC 121 03/23/2020 0000   LDLCALC 123 (H) 11/26/2019 1359    Physical Exam:    VS:  BP 118/68   Pulse (!) 55   Ht _0  (1.549 m)   Wt 202 lb 12.8 oz (92 kg)   SpO2 99%   BMI 38.32 kg/m     Wt Readings from Last 3 Encounters:  10/05/21 202 lb 12.8 oz (92 kg)  07/24/21 200 lb (90.7 kg)  03/22/21 206 lb 8 oz (93.7 kg)      General: Alert, oriented x3, no distress, severely obese.  Healthy left subclavian pacemaker site Head: no evidence of trauma, PERRL, EOMI, no exophtalmos or lid lag, no myxedema, no xanthelasma; normal ears, nose and oropharynx Neck: normal jugular venous pulsations and no hepatojugular reflux; brisk carotid pulses without delay and no carotid bruits Chest: clear to auscultation, no signs of consolidation by percussion or palpation, normal fremitus, symmetrical and full respiratory excursions Cardiovascular: normal position and quality of the apical impulse, regular rhythm, normal first and second heart sounds, no murmurs, rubs or gallops Abdomen: no tenderness or distention, no masses by palpation, no abnormal pulsatility or arterial bruits, normal bowel sounds, no hepatosplenomegaly Extremities: no clubbing, cyanosis, trivial ankle edema, symmetrical ; 2+ radial, ulnar  and brachial pulses bilaterally; 2+ right femoral, posterior tibial and dorsalis pedis pulses; 2+ left femoral, posterior tibial and dorsalis pedis pulses; no subclavian or femoral bruits Neurological: grossly nonfocal Psych: Normal mood and affect    ASSESSMENT:    1. Hypercholesterolemia   2. Peripheral venous insufficiency   3. History of stroke   4. PFO (patent foramen ovale)   5. History of pulmonary embolus (PE)    6. Presence of IVC filter   7. Aortic atherosclerosis ([place])   8. Severe obesity (BMI 35.0-39.9) with comorbidity ([place])     PLAN:    In order of problems listed above:  Edema: Remains relatively mild.  No ulcerations, weeping or blisters.  Likely related to residual venous insufficiency from previous thromboembolic events.   History of stroke/suspected paradoxical embolism due to PFO: Without neurological events in 17 years.  She is currently only on aspirin. At this point it is difficult to argue for PFO closure such a long period of time without events.  Any potential embolic event, even a TIA should prompt reconsideration of PFO closure with a device.  Note that we would have to traverse her inferior vena cava filter from a femoral approach, but the device could be deployed via subclavian approach as well. 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  No recent events and only on aspirin.  As above, any recurrent venous thromboembolic event would probably prompt an indication for lifelong anticoagulation.  IVC filter: At this point this is likely completely endothelialized and will be difficult to remove; hopefully also less likely to be a nidus for thrombus formation. Ao atherosclerosis: Ideally would want to bring her LDL cholesterol is than 70, but she does not want to take medications.  She has an excellent, protective HDL at 94.  She has moderate nonobstructive atheromatous disease in the abdominal aorta, but no evidence of meaningful disease in the coronaries, carotids, thoracic aorta or lower extremity arterial system on multiple past imaging studies.medications.  She does have an excellent HDL cholesterol.  HLP: She has a mildly elevated LDL cholesterol but this is balanced by an exceptionally high level of HDL.  In the absence of significant vascular disease would not recommend statin therapy.  She has predominantly buttock and thigh obesity, without that much in  the way of abdominal fat, suggesting a less aggressive genetic/metabolic risk for vascular disease.  This is supported by the fact that she has excellent glucose levels despite morbid obesity.  Obesity: reviewed diet changes to help weight loss.  She is not able to exercise due to her knee problems. History of HTN: Questionable diagnosis.  She has a excellent blood pressure without any medications. History of left breast cancer status post radiation therapy: While chest radiation can increase the likelihood of coronary disease, this is unlikely to show up for several more years in the future.    Medication Adjustments/Labs and Tests Ordered: Current medicines are reviewed at length with the patient today.  Concerns regarding medicines are outlined above.  Orders Placed This Encounter  Procedures   EKG 12-Lead   No orders of the defined types were placed in this encounter.   Patient Instructions  Medication Instructions:  No changes *If you need a refill on your cardiac medications before your next appointment, please call your pharmacy*   Lab Work: None ordered If you have labs (blood work) drawn today and your tests are completely normal, you will receive your results only by: [facility] (if  you have MyChart) OR A paper copy in the mail If you have any lab test that is abnormal or we need to change your treatment, we will call you to review the results.   Testing/Procedures: None ordered   Follow-Up: At [facility], you and your health needs are our priority.  As part of our continuing mission to provide you with exceptional heart care, we have created designated Provider Care Teams.  These Care Teams include your primary Cardiologist (physician) and Advanced Practice Providers (APPs -  Physician Assistants and Nurse Practitioners) who all work together to provide you with the care you need, when you need it.  We recommend signing up for the patient portal called  "MyChart".  Sign up information is provided on this After Visit Summary.  MyChart is used to connect with patients for Virtual Visits (Telemedicine).  Patients are able to view lab/test results, encounter notes, upcoming appointments, etc.  Non-urgent messages can be sent to your provider as well.   To learn more about what you can do with MyChart, go to [email or web address].    Your next appointment:   12 month(s)  The format for your next appointment:   In Person  Provider:   Sanda Klein, MD {   Important Information About Sugar         Signed, Sanda Klein, MD  10/09/2021 4:36 PM    [place]

## 2021-10-05 NOTE — Patient Instructions (Signed)

## 2021-10-09 ENCOUNTER — Encounter: Payer: Self-pay | Admitting: Cardiovascular Disease

## 2021-10-09 DIAGNOSIS — M47816 Spondylosis without myelopathy or radiculopathy, lumbar region: Secondary | ICD-10-CM | POA: Diagnosis not present

## 2021-10-10 ENCOUNTER — Other Ambulatory Visit (HOSPITAL_BASED_OUTPATIENT_CLINIC_OR_DEPARTMENT_OTHER): Payer: Self-pay

## 2021-10-11 ENCOUNTER — Other Ambulatory Visit (HOSPITAL_BASED_OUTPATIENT_CLINIC_OR_DEPARTMENT_OTHER): Payer: Self-pay

## 2021-10-12 ENCOUNTER — Other Ambulatory Visit (HOSPITAL_BASED_OUTPATIENT_CLINIC_OR_DEPARTMENT_OTHER): Payer: Self-pay

## 2021-10-17 ENCOUNTER — Other Ambulatory Visit (HOSPITAL_BASED_OUTPATIENT_CLINIC_OR_DEPARTMENT_OTHER): Payer: Self-pay

## 2021-10-17 DIAGNOSIS — M542 Cervicalgia: Secondary | ICD-10-CM | POA: Diagnosis not present

## 2021-10-17 DIAGNOSIS — M961 Postlaminectomy syndrome, not elsewhere classified: Secondary | ICD-10-CM | POA: Diagnosis not present

## 2021-10-17 MED ORDER — TRAMADOL HCL 50 MG PO TABS
ORAL_TABLET | ORAL | 0 refills | Status: DC
  Filled 2021-10-17: qty 10, 5d supply, fill #0

## 2021-10-25 DIAGNOSIS — M25561 Pain in right knee: Secondary | ICD-10-CM | POA: Diagnosis not present

## 2021-10-25 DIAGNOSIS — M1712 Unilateral primary osteoarthritis, left knee: Secondary | ICD-10-CM | POA: Diagnosis not present

## 2021-10-25 DIAGNOSIS — M1711 Unilateral primary osteoarthritis, right knee: Secondary | ICD-10-CM | POA: Diagnosis not present

## 2021-10-25 DIAGNOSIS — M25551 Pain in right hip: Secondary | ICD-10-CM | POA: Diagnosis not present

## 2021-11-07 DIAGNOSIS — H5211 Myopia, right eye: Secondary | ICD-10-CM | POA: Diagnosis not present

## 2021-11-10 DIAGNOSIS — M47816 Spondylosis without myelopathy or radiculopathy, lumbar region: Secondary | ICD-10-CM | POA: Diagnosis not present

## 2021-11-10 DIAGNOSIS — M47812 Spondylosis without myelopathy or radiculopathy, cervical region: Secondary | ICD-10-CM | POA: Diagnosis not present

## 2021-11-14 ENCOUNTER — Other Ambulatory Visit: Payer: Self-pay | Admitting: Rehabilitation

## 2021-11-14 DIAGNOSIS — M47812 Spondylosis without myelopathy or radiculopathy, cervical region: Secondary | ICD-10-CM

## 2021-11-28 ENCOUNTER — Other Ambulatory Visit (HOSPITAL_BASED_OUTPATIENT_CLINIC_OR_DEPARTMENT_OTHER): Payer: Self-pay

## 2021-11-28 ENCOUNTER — Ambulatory Visit
Admission: RE | Admit: 2021-11-28 | Discharge: 2021-11-28 | Disposition: A | Discharge status: Home / Self Care | Payer: Medicare Other | Source: Ambulatory Visit | Attending: Rehabilitation | Admitting: Rehabilitation

## 2021-11-28 DIAGNOSIS — M47812 Spondylosis without myelopathy or radiculopathy, cervical region: Secondary | ICD-10-CM

## 2021-11-28 MED ORDER — GADOBENATE DIMEGLUMINE 529 MG/ML IV SOLN
19.0000 mL | Freq: Once | INTRAVENOUS | Status: AC | PRN
Start: 2021-11-28 — End: 2021-11-28
  Administered 2021-11-28: 19 mL via INTRAVENOUS

## 2021-11-30 ENCOUNTER — Other Ambulatory Visit (HOSPITAL_BASED_OUTPATIENT_CLINIC_OR_DEPARTMENT_OTHER): Payer: Self-pay

## 2021-11-30 DIAGNOSIS — M542 Cervicalgia: Secondary | ICD-10-CM | POA: Diagnosis not present

## 2021-11-30 DIAGNOSIS — M47816 Spondylosis without myelopathy or radiculopathy, lumbar region: Secondary | ICD-10-CM | POA: Diagnosis not present

## 2021-11-30 DIAGNOSIS — M47812 Spondylosis without myelopathy or radiculopathy, cervical region: Secondary | ICD-10-CM | POA: Diagnosis not present

## 2021-11-30 MED ORDER — DIAZEPAM 5 MG PO TABS
ORAL_TABLET | ORAL | 1 refills | Status: DC
  Filled 2021-11-30: qty 2, 1d supply, fill #0

## 2021-12-05 ENCOUNTER — Other Ambulatory Visit (INDEPENDENT_AMBULATORY_CARE_PROVIDER_SITE_OTHER): Payer: Self-pay | Admitting: Bariatrics

## 2021-12-05 ENCOUNTER — Other Ambulatory Visit (HOSPITAL_BASED_OUTPATIENT_CLINIC_OR_DEPARTMENT_OTHER): Payer: Self-pay

## 2021-12-05 ENCOUNTER — Other Ambulatory Visit: Payer: Self-pay | Admitting: Hematology and Oncology

## 2021-12-05 DIAGNOSIS — D508 Other iron deficiency anemias: Secondary | ICD-10-CM

## 2021-12-05 DIAGNOSIS — E559 Vitamin D deficiency, unspecified: Secondary | ICD-10-CM

## 2021-12-05 MED ORDER — TAMOXIFEN CITRATE 10 MG PO TABS
10.0000 mg | ORAL_TABLET | Freq: Every day | ORAL | 3 refills | Status: DC
  Filled 2021-12-05: qty 90, 90d supply, fill #0

## 2021-12-06 ENCOUNTER — Other Ambulatory Visit (HOSPITAL_BASED_OUTPATIENT_CLINIC_OR_DEPARTMENT_OTHER): Payer: Self-pay

## 2021-12-06 MED ORDER — VALACYCLOVIR HCL 500 MG PO TABS
ORAL_TABLET | ORAL | 0 refills | Status: DC
  Filled 2021-12-06: qty 180, 90d supply, fill #0

## 2021-12-06 MED ORDER — METHOCARBAMOL 750 MG PO TABS
ORAL_TABLET | ORAL | 1 refills | Status: DC
Start: 2021-12-06 — End: 2022-07-10
  Filled 2021-12-06: qty 60, 20d supply, fill #0
  Filled 2022-03-19: qty 60, 20d supply, fill #1

## 2021-12-07 ENCOUNTER — Other Ambulatory Visit (HOSPITAL_BASED_OUTPATIENT_CLINIC_OR_DEPARTMENT_OTHER): Payer: Self-pay

## 2021-12-07 MED ORDER — SILVER SULFADIAZINE 1 % EX CREA
TOPICAL_CREAM | CUTANEOUS | 1 refills | Status: DC
  Filled 2021-12-07: qty 400, 90d supply, fill #0
  Filled 2022-03-19: qty 400, 90d supply, fill #1
  Filled 2022-07-09: qty 400, 90d supply, fill #2

## 2021-12-07 MED ORDER — HYDROXYZINE HCL 10 MG PO TABS
ORAL_TABLET | ORAL | 1 refills | Status: DC
Start: 2021-12-07 — End: 2024-04-08
  Filled 2021-12-07: qty 90, 90d supply, fill #0
  Filled 2022-03-19: qty 90, 90d supply, fill #1

## 2021-12-14 ENCOUNTER — Other Ambulatory Visit (HOSPITAL_BASED_OUTPATIENT_CLINIC_OR_DEPARTMENT_OTHER): Payer: Self-pay

## 2021-12-19 DIAGNOSIS — M25551 Pain in right hip: Secondary | ICD-10-CM | POA: Diagnosis not present

## 2021-12-25 ENCOUNTER — Encounter (INDEPENDENT_AMBULATORY_CARE_PROVIDER_SITE_OTHER): Payer: Self-pay | Admitting: Bariatrics

## 2021-12-25 ENCOUNTER — Other Ambulatory Visit (HOSPITAL_BASED_OUTPATIENT_CLINIC_OR_DEPARTMENT_OTHER): Payer: Self-pay

## 2021-12-25 ENCOUNTER — Ambulatory Visit (INDEPENDENT_AMBULATORY_CARE_PROVIDER_SITE_OTHER): Payer: Medicare Other | Admitting: Bariatrics

## 2021-12-25 ENCOUNTER — Encounter: Payer: Self-pay | Admitting: Hematology and Oncology

## 2021-12-25 VITALS — BP 106/73 | HR 63 | Temp 98.7°F | Ht 61.0 in | Wt 201.0 lb

## 2021-12-25 DIAGNOSIS — D508 Other iron deficiency anemias: Secondary | ICD-10-CM

## 2021-12-25 DIAGNOSIS — E669 Obesity, unspecified: Secondary | ICD-10-CM

## 2021-12-25 DIAGNOSIS — R7303 Prediabetes: Secondary | ICD-10-CM | POA: Diagnosis not present

## 2021-12-25 DIAGNOSIS — E559 Vitamin D deficiency, unspecified: Secondary | ICD-10-CM

## 2021-12-25 DIAGNOSIS — Z6838 Body mass index (BMI) 38.0-38.9, adult: Secondary | ICD-10-CM

## 2021-12-25 MED ORDER — CHOLECALCIFEROL 1.25 MG (50000 UT) PO CAPS
50000.0000 [IU] | ORAL_CAPSULE | ORAL | 0 refills | Status: DC
  Filled 2021-12-25: qty 12, 84d supply, fill #0

## 2021-12-27 ENCOUNTER — Encounter (INDEPENDENT_AMBULATORY_CARE_PROVIDER_SITE_OTHER): Payer: Self-pay

## 2022-01-01 NOTE — Progress Notes (Unsigned)
Chief Complaint:   OBESITY [name] is here to discuss her progress with her obesity treatment plan along with follow-up of her obesity related diagnoses. [name] is on keeping a food journal and adhering to recommended goals of 1200 calories and 90 grams of protein daily and states she is following her eating plan approximately 0% of the time. [name] states she is doing 0 minutes 0 times per week.  Today's visit was #: 26 Starting weight: 204 lbs Starting date: 02/18/2018 Today's weight: 201 lbs Today's date: 12/25/2021 Total lbs lost to date: 3 Total lbs lost since last in-office visit: 0  Interim History: [name] is up 1 lb, and she has been eating out more.   Subjective:   1. Pre-diabetes [name] is not on medications.   2. Other iron deficiency anemia [name] is taking F[place]on.   3. Vitamin D deficiency [name] is currently taking Vitamin D.  Assessment/Plan:   1. Pre-diabetes [name] will keep all carbohydrates low (starches and sweets).   2. Other iron deficiency anemia [name] will continue her medications as directed.   3. Vitamin D deficiency [name] will continue prescription Vitamin D 50,000 IU once weekly, and we will refill for 90 days.  - Cholecalciferol 1.25 MG (50000 UT) capsule; Take 1 capsule (50,000 Units total) by mouth once a week.  Dispense: 12 capsule; Refill: 0  4. Obesity, current BMI 38.0 [name] is currently in the action stage of change. As such, her goal is to continue with weight loss efforts. She has agreed to practicing portion control and making smarter food choices, such as increasing vegetables and decreasing simple carbohydrates.   Meal planning and intentional eating were discussed.  She will cut back on on her carbohydrates and sugars.  Exercise goals: No exercise has been prescribed at this time.  Behavioral modification strategies: increasing lean protein intake, decreasing simple carbohydrates, increasing  vegetables, increasing water intake, decreasing eating out, no skipping meals, meal planning and cooking strategies, keeping healthy foods in the home, and planning for success.  [name] has agreed to follow-up with our clinic in 4 weeks. She was informed of the importance of frequent follow-up visits to maximize her success with intensive lifestyle modifications for her multiple health conditions.   Objective:   Blood pressure 106/73, pulse 63, temperature 98.7 F (37.1 C), height '5\' 1"'$  (1.549 m), weight 201 lb (91.2 kg), SpO2 98 %. Body mass index is 37.98 kg/m.  General: Cooperative, alert, well developed, in no acute distress. HEENT: Conjunctivae and lids unremarkable. Cardiovascular: Regular rhythm.  Lungs: Normal work of breathing. Neurologic: No focal deficits.   Lab Results  Component Value Date   CREATININE 0.70 10/21/2020   BUN 24 (H) 10/21/2020   NA 131 (L) 10/21/2020   K 7.6 (HH) 10/21/2020   CL 101 10/21/2020   CO2 27 10/21/2020   Lab Results  Component Value Date   ALT 16 07/29/2020   AST 17 07/29/2020   ALKPHOS 57 07/29/2020   BILITOT 0.8 07/29/2020   Lab Results  Component Value Date   HGBA1C 5.7 (H) 11/26/2019   HGBA1C 5.5 06/09/2019   HGBA1C 5.6 12/17/2018   HGBA1C 5.7 (H) 06/10/2018   HGBA1C 5.8 (H) 02/18/2018   Lab Results  Component Value Date   INSULIN 3.8 11/26/2019   INSULIN 7.0 06/09/2019   INSULIN 4.9 02/18/2018   Lab Results  Component Value Date   TSH 0.44 03/23/2020   Lab Results  Component Value Date   CHOL 215 (A)  03/23/2020   HDL 137 (A) 03/23/2020   LDLCALC 121 03/23/2020   TRIG 71 03/23/2020   CHOLHDL 2.5 11/07/2018   Lab Results  Component Value Date   VD25OH 40.7 03/23/2020   VD25OH 40.5 11/26/2019   VD25OH 40.6 06/09/2019   Lab Results  Component Value Date   WBC 4.9 10/21/2020   HGB 12.2 10/21/2020   HCT 36.0 10/21/2020   MCV 95.8 10/21/2020   PLT 178 10/21/2020   No results found for: "IRON", "TIBC",  "FERRITIN"  Attestation Statements:   Reviewed by clinician on day of visit: allergies, medications, problem list, medical history, surgical history, family history, social history, and previous encounter notes.   Wilhemena Durie, am acting as Location manager for [employer], DO.  I have reviewed the above documentation for accuracy and completeness, and I agree with the above. Jearld Lesch, DO

## 2022-01-02 ENCOUNTER — Encounter (INDEPENDENT_AMBULATORY_CARE_PROVIDER_SITE_OTHER): Payer: Self-pay | Admitting: Bariatrics

## 2022-01-02 ENCOUNTER — Other Ambulatory Visit (HOSPITAL_BASED_OUTPATIENT_CLINIC_OR_DEPARTMENT_OTHER): Payer: Self-pay

## 2022-01-03 DIAGNOSIS — M47812 Spondylosis without myelopathy or radiculopathy, cervical region: Secondary | ICD-10-CM | POA: Diagnosis not present

## 2022-01-10 DIAGNOSIS — M47812 Spondylosis without myelopathy or radiculopathy, cervical region: Secondary | ICD-10-CM | POA: Diagnosis not present

## 2022-01-10 DIAGNOSIS — M47816 Spondylosis without myelopathy or radiculopathy, lumbar region: Secondary | ICD-10-CM | POA: Diagnosis not present

## 2022-01-10 DIAGNOSIS — M25551 Pain in right hip: Secondary | ICD-10-CM | POA: Diagnosis not present

## 2022-01-10 DIAGNOSIS — M5416 Radiculopathy, lumbar region: Secondary | ICD-10-CM | POA: Diagnosis not present

## 2022-01-15 DIAGNOSIS — M5416 Radiculopathy, lumbar region: Secondary | ICD-10-CM | POA: Diagnosis not present

## 2022-01-23 ENCOUNTER — Other Ambulatory Visit: Payer: Self-pay | Admitting: Hematology and Oncology

## 2022-01-23 DIAGNOSIS — Z1231 Encounter for screening mammogram for malignant neoplasm of breast: Secondary | ICD-10-CM

## 2022-01-27 ENCOUNTER — Other Ambulatory Visit (HOSPITAL_COMMUNITY): Payer: Self-pay

## 2022-01-30 ENCOUNTER — Other Ambulatory Visit (HOSPITAL_COMMUNITY): Payer: Self-pay

## 2022-01-30 ENCOUNTER — Other Ambulatory Visit (HOSPITAL_BASED_OUTPATIENT_CLINIC_OR_DEPARTMENT_OTHER): Payer: Self-pay

## 2022-01-30 DIAGNOSIS — M47812 Spondylosis without myelopathy or radiculopathy, cervical region: Secondary | ICD-10-CM | POA: Diagnosis not present

## 2022-01-30 MED ORDER — SOLIFENACIN SUCCINATE 10 MG PO TABS
10.0000 mg | ORAL_TABLET | Freq: Every day | ORAL | 0 refills | Status: DC
  Filled 2022-01-30 (×2): qty 90, 90d supply, fill #0

## 2022-02-16 ENCOUNTER — Other Ambulatory Visit: Payer: Self-pay | Admitting: Orthopaedic Surgery

## 2022-02-16 DIAGNOSIS — M25551 Pain in right hip: Secondary | ICD-10-CM

## 2022-03-06 ENCOUNTER — Ambulatory Visit
Admission: RE | Admit: 2022-03-06 | Discharge: 2022-03-06 | Disposition: A | Discharge status: Home / Self Care | Payer: Medicare Other | Source: Ambulatory Visit | Attending: Orthopaedic Surgery | Admitting: Orthopaedic Surgery

## 2022-03-06 ENCOUNTER — Other Ambulatory Visit: Payer: Medicare Other

## 2022-03-06 DIAGNOSIS — M25551 Pain in right hip: Secondary | ICD-10-CM | POA: Diagnosis not present

## 2022-03-07 ENCOUNTER — Other Ambulatory Visit (HOSPITAL_BASED_OUTPATIENT_CLINIC_OR_DEPARTMENT_OTHER): Payer: Self-pay

## 2022-03-07 ENCOUNTER — Other Ambulatory Visit: Payer: Medicare Other

## 2022-03-07 DIAGNOSIS — N390 Urinary tract infection, site not specified: Secondary | ICD-10-CM | POA: Diagnosis not present

## 2022-03-07 DIAGNOSIS — R399 Unspecified symptoms and signs involving the genitourinary system: Secondary | ICD-10-CM | POA: Diagnosis not present

## 2022-03-07 MED ORDER — SULFAMETHOXAZOLE-TRIMETHOPRIM 800-160 MG PO TABS
1.0000 | ORAL_TABLET | Freq: Two times a day (BID) | ORAL | 0 refills | Status: DC
  Filled 2022-03-07: qty 10, 5d supply, fill #0

## 2022-03-16 DIAGNOSIS — M47812 Spondylosis without myelopathy or radiculopathy, cervical region: Secondary | ICD-10-CM | POA: Diagnosis not present

## 2022-03-16 DIAGNOSIS — M1611 Unilateral primary osteoarthritis, right hip: Secondary | ICD-10-CM | POA: Diagnosis not present

## 2022-03-17 NOTE — Progress Notes (Signed)
Patient Care Team: [name], MD as PCP - General (Family Medicine) Croitoru, Dani Gobble, MD as PCP - Cardiology (Cardiology) Nicholas Lose, MD as Consulting Physician (Hematology and Oncology) Kyung Rudd, MD as Consulting Physician (Radiation Oncology) Erroll Luna, MD as Consulting Physician (General Surgery) Delice Bison Charlestine Massed, NP as Nurse Practitioner (Hematology and Oncology)  DIAGNOSIS: No diagnosis found.  SUMMARY OF ONCOLOGIC HISTORY: Oncology History  Malignant neoplasm of lower-outer quadrant of left breast of female, estrogen receptor positive ([place])  06/26/2016 Initial Diagnosis   Left breast biopsy 3:00: IDC with DCIS, grade 3, ER 80%, PR 20%, Ki-67 60%, HER-2 negative ratio 1.18; palpable lump: 1.8 cm lesion, no lymph nodes, T1 cN0 stage I a clinical stage   07/26/2016 Surgery   Left lumpectomy: IDC 1.8 cm, with DCIS, margins negative, 0/2 lymph nodes, ER 80%, PR 20%, HER-2 negative ratio 1.18, Ki-67 60%, T1cN0 stage IA    08/21/2016 Oncotype testing   Oncotype DX recurrence score 51, risk of recurrence 34%   08/21/2016 Genetic Testing   Genetic counseling and testing for hereditary cancer syndromes performed on 08/21/2016. Results are negative for pathogenic mutations in 46 genes analyzed by [facility] Common Hereditary Cancers Panel. Results are dated 09/14/2016. Genes tested: APC, ATM, AXIN2, BARD1, BMPR1A, BRCA1, BRCA2, BRIP1, CDH1, CDKN2A, CHEK2, CTNNA1, DICER1, EPCAM, GREM1, HOXB13, KIT, MEN1, MLH1, MSH2, MSH3, MSH6, MUTYH, NBN, NF1, NTHL1, PALB2, PDGFRA, PMS2, POLD1, POLE, PTEN, RAD50, RAD51C, RAD51D, SDHA, SDHB, SDHC, SDHD, SMAD4, SMARCA4, STK11, TP53, TSC1, TSC2, and VHL.     11/02/2016 - 04/19/2017 Chemotherapy   Dose dense Adriamycin and Cytoxan 4 followed by Taxol weekly 12   06/10/2017 - 07/26/2017 Radiation Therapy   Adjuvant radiation with Dr. Lisbeth Renshaw   08/2017 -  Anti-estrogen oral therapy   Letrozole daily switched to Anastrozole (due to muscle  pains)     CHIEF COMPLIANT: Follow-up of left breast cancer    INTERVAL HISTORY: [name] is a 61 y.o. with above-mentioned history of left breast cancer treated with lumpectomy, adjuvant chemotherapy, radiation, and who is currently on antiestrogen therapy with tamoxifen 5 mg daily. She presents to the clinic today for follow-up.     ALLERGIES:  is allergic to aspirin, oxycodone hcl, propoxyphene n-acetaminophen, tramadol, adhesive [tape], and prednisone.  MEDICATIONS:  Current Outpatient Medications  Medication Sig Dispense Refill   albuterol (VENTOLIN HFA) 108 (90 Base) MCG/ACT inhaler INHALE 2 PUFFS BY MOUTH AS NEEDED EVERY 4 HOURS (Patient taking differently: Inhale 2 puffs into the lungs every 4 (four) hours as needed for shortness of breath or wheezing.) 8.5 g 6   albuterol (VENTOLIN HFA) 108 (90 Base) MCG/ACT inhaler Inhale 1 puff by mouth into the lungs every 4 (four) hours as needed. 8.5 g 1   aspirin EC 81 MG tablet Take 81 mg by mouth daily.     Calcium Citrate-Vitamin D (CALCIUM CITRATE +D PO) Take 2 tablets by mouth daily. Gummies     celecoxib (CELEBREX) 200 MG capsule Take 1 capsule by mouth every day 30 capsule 2   celecoxib (CELEBREX) 200 MG capsule Take 1 capsule by mouth everyday 90 capsule 2   Cholecalciferol 1.25 MG (50000 UT) capsule Take 1 capsule (50,000 Units total) by mouth once a week. 12 capsule 0   COVID-19 mRNA vaccine, Moderna, 100 MCG/0.5ML injection Inject into the muscle. 0.25 mL 0   diazepam (VALIUM) 5 MG tablet take 1-2 tablets by mouth 30-71mns prior to procedure 2 tablet 1   diazepam (VALIUM) 5 MG tablet  Take 1 to 2 tablets by mouth 30 to 60 minutes prior to procedure. 2 tablet 1   docusate sodium (COLACE) 100 MG capsule Take 100 mg by mouth 2 (two) times daily.     famotidine (PEPCID) 40 MG tablet Take 1 tablet (40 mg total) by mouth at bedtime. 90 tablet 1   ferrous gluconate (FERGON) 324 MG tablet TAKE 1 TABLET (324 MG TOTAL) BY  MOUTH DAILY WITH BREAKFAST. 100 tablet 0   hydrOXYzine (ATARAX) 10 MG tablet Take 1 tablet (10 mg total) by mouth at bedtime as needed for itching. 90 tablet 0   hydrOXYzine (ATARAX) 10 MG tablet TAKE 1 TABLET BY MOUTH ONCE DAILY AT BEDTIME IF NEEDED FOR ITCHING 90 tablet 1   hydrOXYzine (ATARAX/VISTARIL) 10 MG tablet Take 10 mg by mouth at bedtime.     influenza vac split quadrivalent PF (FLUARIX) 0.5 ML injection Inject into the muscle. 0.5 mL 0   lisinopril (ZESTRIL) 10 MG tablet TAKE 1 TABLET BY MOUTH ONCE DAILY EVERY MORNING (Patient taking differently: Take 10 mg by mouth every morning.) 90 tablet 0   lisinopril (ZESTRIL) 10 MG tablet Take 1 tablet by mouth every morning 90 tablet 1   methocarbamol (ROBAXIN) 500 MG tablet Take 1 tablet every 6-8 hours by oral route as needed for spasm for 10 days. 60 tablet 1   methocarbamol (ROBAXIN) 750 MG tablet Take 1 tablet (750 mg total) by mouth every 8 (eight) hours as needed for muscle spasms. 60 tablet 1   Methocarbamol (ROBAXIN-750 PO) Take 750 mg by mouth at bedtime as needed (muscle pain).     methocarbamol (ROBAXIN-750) 750 MG tablet Take 1 tablet by mouth 3 times a day 60 tablet 1   Multiple Vitamins-Minerals (ALIVE ONCE DAILY WOMENS PO) Take 1 tablet by mouth daily.     pantoprazole (PROTONIX) 40 MG tablet Please take 1 tablet oral twice daily for first week, then take 1 tablet oral daily. 45 tablet 1   pantoprazole (PROTONIX) 40 MG tablet take 1 tablet by mouth twice a day (Patient taking differently: Take 40 mg by mouth 2 (two) times daily.) 60 tablet 6   pantoprazole (PROTONIX) 40 MG tablet Take 1 tablet by mouth twice a day before meals 180 tablet 1   silver sulfADIAZINE (SILVADENE) 1 % cream Apply 1 application topically 2 (two) times daily as needed (stomach tears). 50 g 0   silver sulfADIAZINE (SILVADENE) 1 % cream Apply 1 application to affected area externally once a day 454 g 1   silver sulfADIAZINE (SILVADENE) 1 % cream Apply 1  application to affected area Externally Once a day 454 g 1   sodium chloride (OCEAN) 0.65 % SOLN nasal spray Place 1 spray into both nostrils at bedtime.     solifenacin (VESICARE) 10 MG tablet Take 10 mg by mouth daily.     solifenacin (VESICARE) 10 MG tablet Take 1 tablet (10 mg total) by mouth daily. 90 tablet 0   sulfamethoxazole-trimethoprim (BACTRIM DS) 800-160 MG tablet Take 1 tablet by mouth 2 (two) times daily. 10 tablet 0   tamoxifen (NOLVADEX) 10 MG tablet Take 1 tablet (10 mg total) by mouth daily. 90 tablet 3   Trolamine Salicylate (BLUE-EMU HEMP EX) Apply 1 application topically daily.     valACYclovir (VALTREX) 500 MG tablet Take 500 mg by mouth 2 (two) times daily as needed (outbreak).     valACYclovir (VALTREX) 500 MG tablet Take 1 tablet (500 mg total) by mouth 2 (two)  times daily as needed. 180 tablet 0   No current facility-administered medications for this visit.    PHYSICAL EXAMINATION: ECOG PERFORMANCE STATUS: {CHL ONC ECOG PS:[id number]}  There were no vitals filed for this visit. There were no vitals filed for this visit.  BREAST:*** No palpable masses or nodules in either right or left breasts. No palpable axillary supraclavicular or infraclavicular adenopathy no breast tenderness or nipple discharge. (exam performed in the presence of a chaperone)  LABORATORY DATA:  I have reviewed the data as listed    Latest Ref Rng & Units 10/21/2020   10:51 AM 10/21/2020   10:08 AM 07/30/2020    3:43 AM  CMP  Glucose 70 - 99 mg/dL 89  90  98   BUN 6 - 20 mg/dL _0 Creatinine 0.44 - 1.00 mg/dL 0.70  0.63  0.77   Sodium 135 - 145 mmol/L 131  135  137   Potassium 3.5 - 5.1 mmol/L 7.6  3.7  4.1   Chloride 98 - 111 mmol/L 101  99  103   CO2 22 - 32 mmol/L  27  27   Calcium 8.9 - 10.3 mg/dL  9.5  9.3     Lab Results  Component Value Date   WBC 4.9 10/21/2020   HGB 12.2 10/21/2020   HCT 36.0 10/21/2020   MCV 95.8 10/21/2020   PLT 178 10/21/2020   NEUTROABS 8.1  (H) 07/28/2020    ASSESSMENT & PLAN:  No problem-specific Assessment & Plan notes found for this encounter.    No orders of the defined types were placed in this encounter.  The patient has a good understanding of the overall plan. she agrees with it. she will call with any problems that may develop before the next visit here. Total time spent: 30 mins including face to face time and time spent for planning, charting and co-ordination of care   Suzzette Righter, [place] 03/17/22    I Gardiner Coins am scribing for Dr. Lindi Adie  ***

## 2022-03-19 ENCOUNTER — Other Ambulatory Visit (HOSPITAL_BASED_OUTPATIENT_CLINIC_OR_DEPARTMENT_OTHER): Payer: Self-pay

## 2022-03-19 ENCOUNTER — Other Ambulatory Visit (INDEPENDENT_AMBULATORY_CARE_PROVIDER_SITE_OTHER): Payer: Self-pay | Admitting: Bariatrics

## 2022-03-19 DIAGNOSIS — D508 Other iron deficiency anemias: Secondary | ICD-10-CM

## 2022-03-19 MED ORDER — COMIRNATY 30 MCG/0.3ML IM SUSY
PREFILLED_SYRINGE | INTRAMUSCULAR | 0 refills | Status: DC
  Filled 2022-03-19: qty 0.3, 1d supply, fill #0

## 2022-03-19 MED ORDER — LISINOPRIL 10 MG PO TABS
10.0000 mg | ORAL_TABLET | Freq: Every morning | ORAL | 0 refills | Status: DC
  Filled 2022-03-19: qty 90, 90d supply, fill #0

## 2022-03-19 MED ORDER — FLUARIX QUADRIVALENT 0.5 ML IM SUSY
PREFILLED_SYRINGE | INTRAMUSCULAR | 0 refills | Status: DC
  Filled 2022-03-19: qty 0.5, 1d supply, fill #0

## 2022-03-21 ENCOUNTER — Other Ambulatory Visit (HOSPITAL_BASED_OUTPATIENT_CLINIC_OR_DEPARTMENT_OTHER): Payer: Self-pay

## 2022-03-21 MED ORDER — ALBUTEROL SULFATE HFA 108 (90 BASE) MCG/ACT IN AERS
1.0000 | INHALATION_SPRAY | RESPIRATORY_TRACT | 1 refills | Status: DC | PRN
  Filled 2022-03-21: qty 6.7, 30d supply, fill #0

## 2022-03-21 MED ORDER — VALACYCLOVIR HCL 500 MG PO TABS
500.0000 mg | ORAL_TABLET | Freq: Two times a day (BID) | ORAL | 0 refills | Status: DC | PRN
  Filled 2022-03-21: qty 180, 90d supply, fill #0

## 2022-03-22 ENCOUNTER — Other Ambulatory Visit: Payer: Self-pay

## 2022-03-22 ENCOUNTER — Other Ambulatory Visit (HOSPITAL_BASED_OUTPATIENT_CLINIC_OR_DEPARTMENT_OTHER): Payer: Self-pay

## 2022-03-22 ENCOUNTER — Inpatient Hospital Stay: Payer: Medicare Other | Attending: Hematology and Oncology | Admitting: Hematology and Oncology

## 2022-03-22 VITALS — BP 130/66 | HR 71 | Temp 97.7°F | Resp 17 | Ht 61.0 in | Wt 205.7 lb

## 2022-03-22 DIAGNOSIS — C50512 Malignant neoplasm of lower-outer quadrant of left female breast: Secondary | ICD-10-CM | POA: Diagnosis not present

## 2022-03-22 DIAGNOSIS — Z9221 Personal history of antineoplastic chemotherapy: Secondary | ICD-10-CM | POA: Diagnosis not present

## 2022-03-22 DIAGNOSIS — Z79811 Long term (current) use of aromatase inhibitors: Secondary | ICD-10-CM | POA: Diagnosis not present

## 2022-03-22 DIAGNOSIS — Z170 Estrogen receptor positive status [ER+]: Secondary | ICD-10-CM | POA: Diagnosis not present

## 2022-03-22 DIAGNOSIS — Z923 Personal history of irradiation: Secondary | ICD-10-CM | POA: Diagnosis not present

## 2022-03-22 NOTE — Assessment & Plan Note (Signed)
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 Adjuvant chemotherapy with Adriamycin/Cytoxan x 4, then weekly Taxol x 12 completed 04/19/17 2. Adjuvant radiation therapy completed on 07/26/2017 3. Adjuvant antiestrogen therapy started 08/28/17 -------------------------------------------------------------------------------------------------------------------------------- Plan: Adj Anti estrogen therapy with Letrozole 2.5 mg daily switched to Anastrozole, bone pain and joint pain even on exemestane Switching to tamoxifen (she will start at 5 mg daily starting 12/03/2020) with the plan to slowly titrate the dosage upwards.   Chemo-induced peripheral neuropathy: resumed gabapentin  Breast cancer surveillance: 1.  Breast exam 03/22/2022: Benign 2.  Mammogram: Scheduled for November 2023 3. Breast MRI 08/29/20: Benign Density Cat C.  (Recommend annual MRIs) 4. CT Abd and pelvis 07/28/20: Post surgical changes   Hospitalization: 07/28/2020-07/31/2018: Generalized abdominal pain, enteritis, GI bleeding Hospitalization: 10/21/20-10/22/20: rev shoulder arthroplasty   5 mg of tamoxifen: side effects:  fatigue, joint pains She is willing to continue with the tamoxifen at 5 mg daily. She will take over-the-counter B12 supplement to see if it provides her more energy.   Bone density 11/28/2020: T score -2: Osteopenia: Currently on calcium and vitamin D.  We discussed pros and cons of bisphosphonate therapy and decided to hold off on those at this time.  With tamoxifen on board hopefully her bone density will remain stable in 2 years.   Return to clinic in 1 year for follow-up

## 2022-04-02 ENCOUNTER — Ambulatory Visit
Admission: RE | Admit: 2022-04-02 | Discharge: 2022-04-02 | Disposition: A | Discharge status: Home / Self Care | Payer: Medicare Other | Source: Ambulatory Visit | Attending: Hematology and Oncology | Admitting: Hematology and Oncology

## 2022-04-02 DIAGNOSIS — Z1231 Encounter for screening mammogram for malignant neoplasm of breast: Secondary | ICD-10-CM

## 2022-04-20 DIAGNOSIS — M1611 Unilateral primary osteoarthritis, right hip: Secondary | ICD-10-CM | POA: Diagnosis not present

## 2022-04-20 DIAGNOSIS — M47812 Spondylosis without myelopathy or radiculopathy, cervical region: Secondary | ICD-10-CM | POA: Diagnosis not present

## 2022-04-24 DIAGNOSIS — E7800 Pure hypercholesterolemia, unspecified: Secondary | ICD-10-CM | POA: Diagnosis not present

## 2022-04-24 DIAGNOSIS — I10 Essential (primary) hypertension: Secondary | ICD-10-CM | POA: Diagnosis not present

## 2022-04-24 DIAGNOSIS — J4520 Mild intermittent asthma, uncomplicated: Secondary | ICD-10-CM | POA: Diagnosis not present

## 2022-04-24 DIAGNOSIS — R7303 Prediabetes: Secondary | ICD-10-CM | POA: Diagnosis not present

## 2022-04-24 DIAGNOSIS — Z0000 Encounter for general adult medical examination without abnormal findings: Secondary | ICD-10-CM | POA: Diagnosis not present

## 2022-04-24 LAB — CBC: RBC: 3.88 (ref 3.87–5.11)

## 2022-04-24 LAB — BASIC METABOLIC PANEL
BUN: 21 (ref 4–21)
CO2: 30 — AB (ref 13–22)
Chloride: 103 (ref 99–108)
Creatinine: 0.8 (ref 0.5–1.1)
Glucose: 89
Potassium: 4.9 mEq/L (ref 3.5–5.1)
Sodium: 140 (ref 137–147)

## 2022-04-24 LAB — CBC AND DIFFERENTIAL
Hemoglobin: 12.6 (ref 12.0–16.0)
Hemoglobin: 5.8 — AB (ref 12.0–16.0)
Platelets: 153 10*3/uL (ref 150–400)
WBC: 4.4

## 2022-04-24 LAB — LIPID PANEL
Cholesterol: 168 (ref 0–200)
LDL Cholesterol: 91
Triglycerides: 66 (ref 40–160)

## 2022-04-24 LAB — COMPREHENSIVE METABOLIC PANEL
Albumin: 4.5 (ref 3.5–5.0)
Calcium: 9.8 (ref 8.7–10.7)

## 2022-04-24 LAB — HEPATIC FUNCTION PANEL
ALT: 13 U/L (ref 7–35)
AST: 18 (ref 13–35)

## 2022-04-24 LAB — HEMOGLOBIN A1C: Hemoglobin A1C: 5.8

## 2022-04-24 LAB — TSH: TSH: 0.8 (ref 0.41–5.90)

## 2022-04-25 DIAGNOSIS — M25551 Pain in right hip: Secondary | ICD-10-CM | POA: Diagnosis not present

## 2022-04-25 DIAGNOSIS — M25552 Pain in left hip: Secondary | ICD-10-CM | POA: Diagnosis not present

## 2022-04-25 DIAGNOSIS — M170 Bilateral primary osteoarthritis of knee: Secondary | ICD-10-CM | POA: Diagnosis not present

## 2022-04-25 DIAGNOSIS — M7061 Trochanteric bursitis, right hip: Secondary | ICD-10-CM | POA: Diagnosis not present

## 2022-04-26 ENCOUNTER — Other Ambulatory Visit (HOSPITAL_BASED_OUTPATIENT_CLINIC_OR_DEPARTMENT_OTHER): Payer: Self-pay

## 2022-04-30 ENCOUNTER — Other Ambulatory Visit (HOSPITAL_BASED_OUTPATIENT_CLINIC_OR_DEPARTMENT_OTHER): Payer: Self-pay

## 2022-04-30 MED ORDER — ALBUTEROL SULFATE HFA 108 (90 BASE) MCG/ACT IN AERS
INHALATION_SPRAY | RESPIRATORY_TRACT | 1 refills | Status: DC
  Filled 2022-04-30: qty 6.7, 21d supply, fill #0
  Filled 2022-07-09: qty 6.7, 33d supply, fill #1

## 2022-05-08 ENCOUNTER — Other Ambulatory Visit (HOSPITAL_BASED_OUTPATIENT_CLINIC_OR_DEPARTMENT_OTHER): Payer: Self-pay

## 2022-05-11 ENCOUNTER — Encounter: Payer: Self-pay | Admitting: Hematology and Oncology

## 2022-05-11 ENCOUNTER — Other Ambulatory Visit (HOSPITAL_BASED_OUTPATIENT_CLINIC_OR_DEPARTMENT_OTHER): Payer: Self-pay

## 2022-05-11 MED ORDER — VITAMIN D (ERGOCALCIFEROL) 50000 UNITS PO CAPS
1.2500 mg | ORAL_CAPSULE | ORAL | 1 refills | Status: DC
  Filled 2022-05-11: qty 6, 84d supply, fill #0
  Filled 2022-09-28 (×3): qty 6, 84d supply, fill #1

## 2022-05-11 MED ORDER — SOLIFENACIN SUCCINATE 10 MG PO TABS
10.0000 mg | ORAL_TABLET | Freq: Every day | ORAL | 0 refills | Status: DC
  Filled 2022-05-11: qty 90, 90d supply, fill #0

## 2022-05-22 ENCOUNTER — Encounter: Payer: Self-pay | Admitting: Hematology and Oncology

## 2022-05-29 ENCOUNTER — Ambulatory Visit: Payer: BLUE CROSS/BLUE SHIELD | Admitting: Bariatrics

## 2022-05-30 ENCOUNTER — Ambulatory Visit (INDEPENDENT_AMBULATORY_CARE_PROVIDER_SITE_OTHER): Payer: Medicare Other | Admitting: Bariatrics

## 2022-05-30 ENCOUNTER — Encounter: Payer: Self-pay | Admitting: Bariatrics

## 2022-05-30 VITALS — BP 118/64 | HR 65 | Temp 98.2°F | Ht 61.0 in | Wt 188.0 lb

## 2022-05-30 DIAGNOSIS — E669 Obesity, unspecified: Secondary | ICD-10-CM

## 2022-05-30 DIAGNOSIS — I10 Essential (primary) hypertension: Secondary | ICD-10-CM

## 2022-05-30 DIAGNOSIS — Z6835 Body mass index (BMI) 35.0-35.9, adult: Secondary | ICD-10-CM

## 2022-05-30 DIAGNOSIS — R7303 Prediabetes: Secondary | ICD-10-CM | POA: Diagnosis not present

## 2022-05-31 ENCOUNTER — Encounter: Payer: Self-pay | Admitting: Hematology and Oncology

## 2022-05-31 ENCOUNTER — Other Ambulatory Visit: Payer: Self-pay

## 2022-06-04 ENCOUNTER — Other Ambulatory Visit: Payer: Self-pay

## 2022-06-04 ENCOUNTER — Encounter: Payer: Self-pay | Admitting: Hematology and Oncology

## 2022-06-04 ENCOUNTER — Encounter: Payer: Self-pay | Admitting: Bariatrics

## 2022-06-06 ENCOUNTER — Telehealth: Payer: Self-pay | Admitting: *Deleted

## 2022-06-06 NOTE — Telephone Encounter (Signed)
Ordered BCI per Dr. Lindi Adie. Faxed requisition to [facility].

## 2022-06-10 NOTE — Progress Notes (Signed)
Chief Complaint:   OBESITY [name] is here to discuss her progress with her obesity treatment plan along with follow-up of her obesity related diagnoses. [name] is on practicing portion control and making smarter food choices, such as increasing vegetables and decreasing simple carbohydrates and states she is following her eating plan approximately 100% of the time. [name] states she is not currently exercising.  Today's visit was #: 37 Starting weight: 204 lbs Starting date: 02/18/2018 Today's weight: 188 lbs Today's date: 05/30/2022 Total lbs lost to date: 16 Total lbs lost since last in-office visit: 3  Interim History: [name] is down 13 lbs since her last visit.   Subjective:   1. Pre-diabetes Discussed labs with patient today. A1c 5.8 Pristine is not on medication.  2. Essential hypertension BP controlled. Resa is taking lisinopril.  Assessment/Plan:   1. Pre-diabetes Minimize all sugars and starches.  2. Essential hypertension Continue current treatment plan.  3. Obesity, current BMI 35.6 [name] is currently in the action stage of change. As such, her goal is to continue with weight loss efforts. She has agreed to keeping a food journal and adhering to recommended goals of 1200 calories and 80 grams protein and practicing portion control and making smarter food choices, such as increasing vegetables and decreasing simple carbohydrates.   Will continue to adhere closely to the plan. Increase fiber. Review labs.  Exercise goals:  hip pain, decrease exercise  Behavioral modification strategies: increasing lean protein intake, decreasing simple carbohydrates, increasing vegetables, increasing water intake, decreasing eating out, no skipping meals, meal planning and cooking strategies, keeping healthy foods in the home, and planning for success.  [name] has agreed to follow-up with our clinic in 5 weeks. She was informed of the importance of frequent  follow-up visits to maximize her success with intensive lifestyle modifications for her multiple health conditions.   Objective:   Blood pressure 118/64, pulse 65, temperature 98.2 F (36.8 C), height '5\' 1"'$  (1.549 m), weight 188 lb (85.3 kg), SpO2 98 %. Body mass index is 35.52 kg/m.  General: Cooperative, alert, well developed, in no acute distress. HEENT: Conjunctivae and lids unremarkable. Cardiovascular: Regular rhythm.  Lungs: Normal work of breathing. Neurologic: No focal deficits.   Lab Results  Component Value Date   CREATININE 0.8 04/24/2022   BUN 21 04/24/2022   NA 140 04/24/2022   K 4.9 04/24/2022   CL 103 04/24/2022   CO2 30 (A) 04/24/2022   Lab Results  Component Value Date   ALT 13 04/24/2022   AST 18 04/24/2022   ALKPHOS 57 07/29/2020   BILITOT 0.8 07/29/2020   Lab Results  Component Value Date   HGBA1C 5.8 04/24/2022   HGBA1C 5.7 (H) 11/26/2019   HGBA1C 5.5 06/09/2019   HGBA1C 5.6 12/17/2018   HGBA1C 5.7 (H) 06/10/2018   Lab Results  Component Value Date   INSULIN 3.8 11/26/2019   INSULIN 7.0 06/09/2019   INSULIN 4.9 02/18/2018   Lab Results  Component Value Date   TSH 0.80 04/24/2022   Lab Results  Component Value Date   CHOL 168 04/24/2022   HDL 137 (A) 03/23/2020   LDLCALC 91 04/24/2022   TRIG 66 04/24/2022   CHOLHDL 2.5 11/07/2018   Lab Results  Component Value Date   VD25OH 40.7 03/23/2020   VD25OH 40.5 11/26/2019   VD25OH 40.6 06/09/2019   Lab Results  Component Value Date   WBC 4.4 04/24/2022   HGB 5.8 (A) 04/24/2022   HGB 12.6 04/24/2022  HCT 36.0 10/21/2020   MCV 95.8 10/21/2020   PLT 153 04/24/2022   Attestation Statements:   Reviewed by clinician on day of visit: allergies, medications, problem list, medical history, surgical history, family history, social history, and previous encounter notes.  I, Kathlene November, BS, CMA, am acting as transcriptionist for [employer], DO.  I have reviewed the above  documentation for accuracy and completeness, and I agree with the above. Jearld Lesch, DO

## 2022-06-11 ENCOUNTER — Encounter: Payer: Self-pay | Admitting: Bariatrics

## 2022-06-15 ENCOUNTER — Encounter: Payer: Self-pay | Admitting: Hematology and Oncology

## 2022-06-20 ENCOUNTER — Other Ambulatory Visit: Payer: Self-pay | Admitting: Family Medicine

## 2022-06-20 DIAGNOSIS — Z1231 Encounter for screening mammogram for malignant neoplasm of breast: Secondary | ICD-10-CM

## 2022-06-21 DIAGNOSIS — C50212 Malignant neoplasm of upper-inner quadrant of left female breast: Secondary | ICD-10-CM | POA: Diagnosis not present

## 2022-06-28 ENCOUNTER — Encounter: Payer: Self-pay | Admitting: Hematology and Oncology

## 2022-06-28 DIAGNOSIS — Z96612 Presence of left artificial shoulder joint: Secondary | ICD-10-CM | POA: Diagnosis not present

## 2022-06-28 DIAGNOSIS — M25511 Pain in right shoulder: Secondary | ICD-10-CM | POA: Diagnosis not present

## 2022-07-09 ENCOUNTER — Other Ambulatory Visit (INDEPENDENT_AMBULATORY_CARE_PROVIDER_SITE_OTHER): Payer: Self-pay | Admitting: Bariatrics

## 2022-07-09 ENCOUNTER — Other Ambulatory Visit (HOSPITAL_BASED_OUTPATIENT_CLINIC_OR_DEPARTMENT_OTHER): Payer: Self-pay

## 2022-07-09 DIAGNOSIS — D508 Other iron deficiency anemias: Secondary | ICD-10-CM

## 2022-07-10 ENCOUNTER — Other Ambulatory Visit: Payer: Self-pay

## 2022-07-10 ENCOUNTER — Encounter: Payer: Self-pay | Admitting: Hematology and Oncology

## 2022-07-10 ENCOUNTER — Other Ambulatory Visit (HOSPITAL_BASED_OUTPATIENT_CLINIC_OR_DEPARTMENT_OTHER): Payer: Self-pay

## 2022-07-10 DIAGNOSIS — M5412 Radiculopathy, cervical region: Secondary | ICD-10-CM | POA: Diagnosis not present

## 2022-07-10 DIAGNOSIS — M5416 Radiculopathy, lumbar region: Secondary | ICD-10-CM | POA: Diagnosis not present

## 2022-07-10 MED ORDER — METHOCARBAMOL 750 MG PO TABS
750.0000 mg | ORAL_TABLET | Freq: Three times a day (TID) | ORAL | 1 refills | Status: DC
  Filled 2022-07-10: qty 60, 20d supply, fill #0
  Filled 2022-09-24 – 2022-09-28 (×2): qty 60, 20d supply, fill #1

## 2022-07-11 ENCOUNTER — Other Ambulatory Visit (HOSPITAL_BASED_OUTPATIENT_CLINIC_OR_DEPARTMENT_OTHER): Payer: Self-pay

## 2022-07-12 ENCOUNTER — Other Ambulatory Visit (HOSPITAL_BASED_OUTPATIENT_CLINIC_OR_DEPARTMENT_OTHER): Payer: Self-pay

## 2022-07-12 ENCOUNTER — Encounter: Payer: Self-pay | Admitting: Hematology and Oncology

## 2022-07-12 DIAGNOSIS — M5416 Radiculopathy, lumbar region: Secondary | ICD-10-CM | POA: Diagnosis not present

## 2022-07-12 MED ORDER — LISINOPRIL 10 MG PO TABS
10.0000 mg | ORAL_TABLET | Freq: Every morning | ORAL | 1 refills | Status: DC
  Filled 2022-07-12: qty 90, 90d supply, fill #0
  Filled 2022-10-08: qty 90, 90d supply, fill #1

## 2022-07-12 MED ORDER — VALACYCLOVIR HCL 500 MG PO TABS
500.0000 mg | ORAL_TABLET | Freq: Two times a day (BID) | ORAL | 0 refills | Status: DC | PRN
  Filled 2022-07-12: qty 180, 90d supply, fill #0

## 2022-07-12 MED ORDER — SOLIFENACIN SUCCINATE 10 MG PO TABS
10.0000 mg | ORAL_TABLET | Freq: Every day | ORAL | 0 refills | Status: DC
  Filled 2022-07-12 – 2022-07-18 (×2): qty 90, 90d supply, fill #0

## 2022-07-12 MED ORDER — SILVER SULFADIAZINE 1 % EX CREA
1.0000 | TOPICAL_CREAM | Freq: Every day | CUTANEOUS | 1 refills | Status: DC
  Filled 2022-07-12: qty 400, 90d supply, fill #0
  Filled 2022-09-24 – 2022-09-28 (×2): qty 400, 90d supply, fill #1

## 2022-07-13 ENCOUNTER — Other Ambulatory Visit (HOSPITAL_BASED_OUTPATIENT_CLINIC_OR_DEPARTMENT_OTHER): Payer: Self-pay

## 2022-07-16 ENCOUNTER — Other Ambulatory Visit (HOSPITAL_BASED_OUTPATIENT_CLINIC_OR_DEPARTMENT_OTHER): Payer: Self-pay

## 2022-07-16 MED ORDER — DIAZEPAM 5 MG PO TABS
5.0000 mg | ORAL_TABLET | ORAL | 0 refills | Status: DC
  Filled 2022-07-16: qty 2, 1d supply, fill #0

## 2022-07-18 ENCOUNTER — Other Ambulatory Visit (HOSPITAL_BASED_OUTPATIENT_CLINIC_OR_DEPARTMENT_OTHER): Payer: Self-pay

## 2022-07-30 DIAGNOSIS — M5412 Radiculopathy, cervical region: Secondary | ICD-10-CM | POA: Diagnosis not present

## 2022-08-14 DIAGNOSIS — M542 Cervicalgia: Secondary | ICD-10-CM | POA: Diagnosis not present

## 2022-08-14 DIAGNOSIS — M5416 Radiculopathy, lumbar region: Secondary | ICD-10-CM | POA: Diagnosis not present

## 2022-08-27 DIAGNOSIS — M170 Bilateral primary osteoarthritis of knee: Secondary | ICD-10-CM | POA: Diagnosis not present

## 2022-08-27 DIAGNOSIS — M25551 Pain in right hip: Secondary | ICD-10-CM | POA: Diagnosis not present

## 2022-09-24 ENCOUNTER — Other Ambulatory Visit (INDEPENDENT_AMBULATORY_CARE_PROVIDER_SITE_OTHER): Payer: Self-pay | Admitting: Bariatrics

## 2022-09-24 ENCOUNTER — Other Ambulatory Visit (HOSPITAL_COMMUNITY): Payer: Self-pay

## 2022-09-24 DIAGNOSIS — D508 Other iron deficiency anemias: Secondary | ICD-10-CM

## 2022-09-24 DIAGNOSIS — E559 Vitamin D deficiency, unspecified: Secondary | ICD-10-CM

## 2022-09-25 ENCOUNTER — Other Ambulatory Visit (HOSPITAL_COMMUNITY): Payer: Self-pay

## 2022-09-25 ENCOUNTER — Other Ambulatory Visit: Payer: Self-pay

## 2022-09-25 MED ORDER — ALBUTEROL SULFATE HFA 108 (90 BASE) MCG/ACT IN AERS
1.0000 | INHALATION_SPRAY | RESPIRATORY_TRACT | 0 refills | Status: DC | PRN
  Filled 2022-09-25: qty 6.7, 24d supply, fill #0
  Filled 2022-09-28: qty 6.7, 50d supply, fill #0

## 2022-09-25 MED ORDER — VALACYCLOVIR HCL 500 MG PO TABS
500.0000 mg | ORAL_TABLET | Freq: Two times a day (BID) | ORAL | 0 refills | Status: DC | PRN
  Filled 2022-09-25 – 2022-09-28 (×2): qty 180, 90d supply, fill #0

## 2022-09-25 MED ORDER — SOLIFENACIN SUCCINATE 10 MG PO TABS
10.0000 mg | ORAL_TABLET | Freq: Every day | ORAL | 0 refills | Status: DC
  Filled 2022-09-25 – 2022-09-28 (×2): qty 90, 90d supply, fill #0

## 2022-09-26 ENCOUNTER — Ambulatory Visit
Admission: RE | Admit: 2022-09-26 | Discharge: 2022-09-26 | Disposition: A | Discharge status: Home / Self Care | Payer: Medicare Other | Source: Ambulatory Visit | Attending: Hematology and Oncology | Admitting: Hematology and Oncology

## 2022-09-26 DIAGNOSIS — Z853 Personal history of malignant neoplasm of breast: Secondary | ICD-10-CM | POA: Diagnosis not present

## 2022-09-26 DIAGNOSIS — Z1239 Encounter for other screening for malignant neoplasm of breast: Secondary | ICD-10-CM | POA: Diagnosis not present

## 2022-09-26 DIAGNOSIS — Z170 Estrogen receptor positive status [ER+]: Secondary | ICD-10-CM

## 2022-09-26 DIAGNOSIS — Z803 Family history of malignant neoplasm of breast: Secondary | ICD-10-CM | POA: Diagnosis not present

## 2022-09-26 MED ORDER — GADOPICLENOL 0.5 MMOL/ML IV SOLN
10.0000 mL | Freq: Once | INTRAVENOUS | Status: AC | PRN
  Administered 2022-09-26: 10 mL via INTRAVENOUS

## 2022-09-28 ENCOUNTER — Encounter: Payer: Self-pay | Admitting: Hematology and Oncology

## 2022-09-28 ENCOUNTER — Other Ambulatory Visit: Payer: Self-pay

## 2022-09-28 ENCOUNTER — Other Ambulatory Visit (HOSPITAL_COMMUNITY): Payer: Self-pay

## 2022-09-28 ENCOUNTER — Other Ambulatory Visit (INDEPENDENT_AMBULATORY_CARE_PROVIDER_SITE_OTHER): Payer: Self-pay | Admitting: Bariatrics

## 2022-09-28 DIAGNOSIS — D508 Other iron deficiency anemias: Secondary | ICD-10-CM

## 2022-10-01 ENCOUNTER — Encounter: Payer: Self-pay | Admitting: Hematology and Oncology

## 2022-10-01 ENCOUNTER — Other Ambulatory Visit (HOSPITAL_COMMUNITY): Payer: Self-pay

## 2022-10-01 ENCOUNTER — Other Ambulatory Visit: Payer: Self-pay | Admitting: Bariatrics

## 2022-10-01 ENCOUNTER — Other Ambulatory Visit: Payer: Self-pay

## 2022-10-01 MED ORDER — VITAMIN D (ERGOCALCIFEROL) 50000 UNITS PO CAPS
1.0000 | ORAL_CAPSULE | ORAL | 0 refills | Status: DC
  Filled 2022-10-01: qty 6, 84d supply, fill #0
  Filled 2022-10-01: qty 6, fill #0

## 2022-10-02 ENCOUNTER — Encounter: Payer: Self-pay | Admitting: Hematology and Oncology

## 2022-10-02 ENCOUNTER — Other Ambulatory Visit (HOSPITAL_BASED_OUTPATIENT_CLINIC_OR_DEPARTMENT_OTHER): Payer: Self-pay

## 2022-10-02 ENCOUNTER — Other Ambulatory Visit (HOSPITAL_COMMUNITY): Payer: Self-pay

## 2022-10-02 ENCOUNTER — Other Ambulatory Visit: Payer: Self-pay

## 2022-10-02 ENCOUNTER — Encounter: Payer: Self-pay | Admitting: Bariatrics

## 2022-10-02 DIAGNOSIS — D508 Other iron deficiency anemias: Secondary | ICD-10-CM

## 2022-10-02 DIAGNOSIS — E559 Vitamin D deficiency, unspecified: Secondary | ICD-10-CM

## 2022-10-02 MED ORDER — VITAMIN D3 1.25 MG (50000 UT) PO CAPS
50000.0000 [IU] | ORAL_CAPSULE | ORAL | 0 refills | Status: DC
  Filled 2022-10-02 (×2): qty 4, 28d supply, fill #0

## 2022-10-02 MED ORDER — FERROUS GLUCONATE 324 (38 FE) MG PO TABS
324.0000 mg | ORAL_TABLET | Freq: Every day | ORAL | 0 refills | Status: DC
  Filled 2022-10-02 (×2): qty 100, 100d supply, fill #0

## 2022-10-02 NOTE — Telephone Encounter (Signed)
Spoke to patient and sent in correct prescription for D3 and iron. Patient verbalized understanding.

## 2022-10-03 ENCOUNTER — Other Ambulatory Visit (HOSPITAL_COMMUNITY): Payer: Self-pay

## 2022-10-03 ENCOUNTER — Other Ambulatory Visit (HOSPITAL_BASED_OUTPATIENT_CLINIC_OR_DEPARTMENT_OTHER): Payer: Self-pay

## 2022-10-08 ENCOUNTER — Other Ambulatory Visit (HOSPITAL_COMMUNITY): Payer: Self-pay

## 2022-10-19 ENCOUNTER — Other Ambulatory Visit (HOSPITAL_COMMUNITY): Payer: Self-pay

## 2022-10-24 ENCOUNTER — Other Ambulatory Visit (HOSPITAL_BASED_OUTPATIENT_CLINIC_OR_DEPARTMENT_OTHER): Payer: Self-pay

## 2022-10-24 MED ORDER — DIAZEPAM 5 MG PO TABS
ORAL_TABLET | ORAL | 0 refills | Status: DC
  Filled 2022-10-24: qty 2, 1d supply, fill #0

## 2022-10-26 DIAGNOSIS — S7001XA Contusion of right hip, initial encounter: Secondary | ICD-10-CM | POA: Diagnosis not present

## 2022-10-29 ENCOUNTER — Ambulatory Visit: Payer: BLUE CROSS/BLUE SHIELD | Admitting: Bariatrics

## 2022-10-30 ENCOUNTER — Other Ambulatory Visit (HOSPITAL_BASED_OUTPATIENT_CLINIC_OR_DEPARTMENT_OTHER): Payer: Self-pay

## 2022-10-30 DIAGNOSIS — R7303 Prediabetes: Secondary | ICD-10-CM | POA: Diagnosis not present

## 2022-10-30 DIAGNOSIS — Z862 Personal history of diseases of the blood and blood-forming organs and certain disorders involving the immune mechanism: Secondary | ICD-10-CM | POA: Diagnosis not present

## 2022-10-30 DIAGNOSIS — R233 Spontaneous ecchymoses: Secondary | ICD-10-CM | POA: Diagnosis not present

## 2022-10-30 DIAGNOSIS — I700 Atherosclerosis of aorta: Secondary | ICD-10-CM | POA: Diagnosis not present

## 2022-10-30 DIAGNOSIS — E7800 Pure hypercholesterolemia, unspecified: Secondary | ICD-10-CM | POA: Diagnosis not present

## 2022-10-30 DIAGNOSIS — I10 Essential (primary) hypertension: Secondary | ICD-10-CM | POA: Diagnosis not present

## 2022-10-30 LAB — HEPATIC FUNCTION PANEL
ALT: 18 U/L (ref 7–35)
AST: 18 (ref 13–35)

## 2022-10-30 LAB — BASIC METABOLIC PANEL
BUN: 19 (ref 4–21)
CO2: 28 — AB (ref 13–22)
Chloride: 99 (ref 99–108)
Creatinine: 0.7 (ref 0.5–1.1)
Glucose: 91
Potassium: 4 mEq/L (ref 3.5–5.1)
Sodium: 134 — AB (ref 137–147)

## 2022-10-30 LAB — IRON,TIBC AND FERRITIN PANEL
Ferritin: 53.4
Iron: 92

## 2022-10-30 LAB — CBC AND DIFFERENTIAL
HCT: 37 (ref 36–46)
Hemoglobin: 12.4 (ref 12.0–16.0)
Platelets: 200 10*3/uL (ref 150–400)
WBC: 5.7

## 2022-10-30 LAB — COMPREHENSIVE METABOLIC PANEL
Albumin: 4.5 (ref 3.5–5.0)
Calcium: 9.5 (ref 8.7–10.7)
eGFR: 99

## 2022-10-30 LAB — LIPID PANEL
Cholesterol: 216 — AB (ref 0–200)
HDL: 139 — AB (ref 35–70)
LDL Cholesterol: 128
LDl/HDL Ratio: 2.8
Triglycerides: 66 (ref 40–160)

## 2022-10-30 LAB — POCT INR: INR: 1 (ref 0.80–1.20)

## 2022-10-30 LAB — TSH: TSH: 0.59 (ref 0.41–5.90)

## 2022-10-30 LAB — HEMOGLOBIN A1C: Hemoglobin A1C: 5.7

## 2022-10-30 LAB — CBC: RBC: 3.73 — AB (ref 3.87–5.11)

## 2022-10-31 ENCOUNTER — Ambulatory Visit (INDEPENDENT_AMBULATORY_CARE_PROVIDER_SITE_OTHER): Payer: Medicare Other | Admitting: Bariatrics

## 2022-10-31 ENCOUNTER — Other Ambulatory Visit (HOSPITAL_BASED_OUTPATIENT_CLINIC_OR_DEPARTMENT_OTHER): Payer: Self-pay

## 2022-10-31 ENCOUNTER — Encounter: Payer: Self-pay | Admitting: Bariatrics

## 2022-10-31 ENCOUNTER — Encounter: Payer: Self-pay | Admitting: Hematology and Oncology

## 2022-10-31 VITALS — BP 115/77 | HR 63 | Temp 98.5°F | Ht 61.0 in | Wt 173.0 lb

## 2022-10-31 DIAGNOSIS — E559 Vitamin D deficiency, unspecified: Secondary | ICD-10-CM

## 2022-10-31 DIAGNOSIS — Z6832 Body mass index (BMI) 32.0-32.9, adult: Secondary | ICD-10-CM

## 2022-10-31 DIAGNOSIS — E669 Obesity, unspecified: Secondary | ICD-10-CM

## 2022-10-31 DIAGNOSIS — R7303 Prediabetes: Secondary | ICD-10-CM

## 2022-10-31 MED ORDER — VITAMIN D3 1.25 MG (50000 UT) PO CAPS
ORAL_CAPSULE | ORAL | 0 refills | Status: DC
  Filled 2022-10-31: qty 12, 84d supply, fill #0

## 2022-10-31 NOTE — Progress Notes (Signed)
WEIGHT SUMMARY AND BIOMETRICS  Weight Lost Since Last Visit: 15lb   Vitals Temp: 98.5 F (36.9 C) BP: 115/77 Pulse Rate: 63 SpO2: 100 %   Anthropometric Measurements Height: 5\' 1"  (1.549 m) Weight: 173 lb (78.5 kg) BMI (Calculated): 32.7 Weight at Last Visit: 188lb Weight Lost Since Last Visit: 15lb Starting Weight: 204lb Total Weight Loss (lbs): 31 lb (14.1 kg)   Body Composition  Body Fat %: 47.2 % Fat Mass (lbs): 82 lbs Muscle Mass (lbs): 87.2 lbs Visceral Fat Rating : 13   Other Clinical Data Fasting: no Labs: no Today's Visit #: 28 Starting Date: 02/18/18    OBESITY [name] is here to discuss her progress with her obesity treatment plan along with follow-up of her obesity related diagnoses.     Nutrition Plan: keeping a food journal with goal of 1200 calories and 80 grams of protein daily - 95% adherence.  Current exercise: none  Interim History:  She is down 15 lbs since her last visit.  Eating all of the food on the plan., Protein intake is as prescribed, Is skipping meals, and Meeting protein goals.   Hunger is moderately controlled.  Cravings are moderately controlled.  Assessment/Plan:   Prediabetes Last A1c was 5.8  Medication(s): none Lab Results  Component Value Date   HGBA1C 5.8 04/24/2022   HGBA1C 5.7 (H) 11/26/2019   HGBA1C 5.5 06/09/2019   HGBA1C 5.6 12/17/2018   HGBA1C 5.7 (H) 06/10/2018   Lab Results  Component Value Date   INSULIN 3.8 11/26/2019   INSULIN 7.0 06/09/2019   INSULIN 4.9 02/18/2018    Plan: Will minimize all refined carbohydrates both sweets and starches.  Will work on the plan and exercise.  Consider both aerobic and resistance training.  Will keep protein, water, and fiber intake high.  Increase Polyunsaturated and Monounsaturated fats to increase satiety and encourage weight loss.  Aim for 7 to 9 hours of sleep nightly.  Will continue medications.    Vitamin D Deficiency Vitamin D is  not at goal of 50.  Most recent vitamin D level was 40.7. She is on  prescription ergocalciferol 50,000 IU weekly. Lab Results  Component Value Date   VD25OH 40.7 03/23/2020   VD25OH 40.5 11/26/2019   VD25OH 40.6 06/09/2019    Plan: Refill prescription vitamin D 50,000 IU weekly. # 12 x 0 refills   Labs done today (CMP, Lipids, HgbA1c,, and TSH)..    Generalized Obesity: Current BMI BMI (Calculated): 32.7    [name] is currently in the action stage of change. As such, her goal is to continue with weight loss efforts.  She has agreed to keeping a food journal with goal of 1,200 calories and 80 grams of protein daily.  Exercise goals: For substantial health benefits, adults should do at least 150 minutes (2 hours and 30 minutes) a week of moderate-intensity, or 75 minutes (1 hour and 15 minutes) a week of vigorous-intensity aerobic physical activity, or an equivalent combination of moderate- and vigorous-intensity aerobic activity. Aerobic activity should be performed in episodes of at least 10 minutes, and preferably, it should be spread throughout the week.  Behavioral modification strategies: increasing lean protein intake, no meal skipping, meal planning , increasing vegetables, increasing lower sugar fruits, and increasing fiber rich foods.  [name] has agreed to follow-up with our clinic in 3 to 6 month.    Medications Discontinued During This Encounter  Medication Reason   tamoxifen (NOLVADEX) 10 MG tablet Patient Preference   silver  sulfADIAZINE (SILVADENE) 1 % cream Patient Preference   silver sulfADIAZINE (SILVADENE) 1 % cream Patient Preference   hydrOXYzine (ATARAX/VISTARIL) 10 MG tablet Patient Preference   diazepam (VALIUM) 5 MG tablet Patient Preference   Vitamin D, Ergocalciferol, 50000 units CAPS Patient Preference       Objective:   VITALS: Per patient if applicable, see vitals. GENERAL: Alert and in no acute distress. CARDIOPULMONARY: No increased WOB.  Speaking in clear sentences.  PSYCH: Pleasant and cooperative. Speech normal rate and rhythm. Affect is appropriate. Insight and judgement are appropriate. Attention is focused, linear, and appropriate.  NEURO: Oriented as arrived to appointment on time with no prompting.   Attestation Statements:     This was prepared with the assistance of Engineer, civil (consulting).  Occasional wrong-word or sound-a-like substitutions may have occurred due to the inherent limitations of voice recognition software.   Corinna Capra, DO

## 2022-11-02 DIAGNOSIS — M5416 Radiculopathy, lumbar region: Secondary | ICD-10-CM | POA: Diagnosis not present

## 2022-11-05 ENCOUNTER — Other Ambulatory Visit (HOSPITAL_BASED_OUTPATIENT_CLINIC_OR_DEPARTMENT_OTHER): Payer: Self-pay

## 2022-11-05 ENCOUNTER — Other Ambulatory Visit (HOSPITAL_COMMUNITY): Payer: Self-pay

## 2022-11-08 ENCOUNTER — Encounter: Payer: Self-pay | Admitting: Bariatrics

## 2022-11-13 DIAGNOSIS — H18413 Arcus senilis, bilateral: Secondary | ICD-10-CM | POA: Diagnosis not present

## 2022-11-13 DIAGNOSIS — Z711 Person with feared health complaint in whom no diagnosis is made: Secondary | ICD-10-CM | POA: Diagnosis not present

## 2022-11-15 DIAGNOSIS — M542 Cervicalgia: Secondary | ICD-10-CM | POA: Diagnosis not present

## 2022-11-15 DIAGNOSIS — M5416 Radiculopathy, lumbar region: Secondary | ICD-10-CM | POA: Diagnosis not present

## 2022-11-27 ENCOUNTER — Other Ambulatory Visit (HOSPITAL_BASED_OUTPATIENT_CLINIC_OR_DEPARTMENT_OTHER): Payer: Self-pay

## 2022-11-27 MED ORDER — DIAZEPAM 5 MG PO TABS
5.0000 mg | ORAL_TABLET | ORAL | 0 refills | Status: DC
  Filled 2022-11-27: qty 2, 1d supply, fill #0

## 2022-11-28 ENCOUNTER — Ambulatory Visit: Payer: Medicare Other | Attending: Cardiovascular Disease | Admitting: Cardiovascular Disease

## 2022-11-28 ENCOUNTER — Encounter: Payer: Self-pay | Admitting: Cardiovascular Disease

## 2022-11-28 VITALS — BP 114/70 | HR 57 | Ht 61.0 in | Wt 178.2 lb

## 2022-11-28 DIAGNOSIS — Z95828 Presence of other vascular implants and grafts: Secondary | ICD-10-CM | POA: Diagnosis not present

## 2022-11-28 DIAGNOSIS — I700 Atherosclerosis of aorta: Secondary | ICD-10-CM

## 2022-11-28 DIAGNOSIS — I10 Essential (primary) hypertension: Secondary | ICD-10-CM

## 2022-11-28 DIAGNOSIS — Z8673 Personal history of transient ischemic attack (TIA), and cerebral infarction without residual deficits: Secondary | ICD-10-CM

## 2022-11-28 DIAGNOSIS — Q2112 Patent foramen ovale: Secondary | ICD-10-CM | POA: Diagnosis not present

## 2022-11-28 DIAGNOSIS — Z86711 Personal history of pulmonary embolism: Secondary | ICD-10-CM

## 2022-11-28 DIAGNOSIS — E669 Obesity, unspecified: Secondary | ICD-10-CM

## 2022-11-28 DIAGNOSIS — E7800 Pure hypercholesterolemia, unspecified: Secondary | ICD-10-CM

## 2022-11-28 NOTE — Progress Notes (Signed)
Cardiology Office Note:    Date:  12/02/2022   ID:  [name], DOB 1960/07/24, MRN [id number]  PCP:  Noberto Retort, MD  Cardiologist:  Thurmon Fair, MD  Electrophysiologist:  None   Referring MD: Noberto Retort, MD   No chief complaint on file. History of pulmonary embolism, paradoxical embolism stroke, inferior vena cava filter  History of Present Illness:    [name] is a 62 y.o. 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  She has normal left ventricular systolic function with "moderate diastolic dysfunction" by echo in May 2018 (on my review I would have said that she has normal diastolic function), normal coronary arteries by cardiac catheterization in 2003, normal nuclear stress test in 2017, normal ABIs in 2012.  Abdominal CT in 2012 did show moderate atheromatous calcification in the infrarenal aorta, without any significant arterial obstruction in the lower extremities and three-vessel runoff bilaterally.  I am not entirely sure, but it seems that the inferior vena cava filter was implanted prophylactically before her gastric bypass surgery since she had a history of pulmonary embolism, paradoxical stroke and upper extremity DVT.  She was on anticoagulation with warfarin until 2009, only on aspirin since then without any recurrent arterial or venous thromboembolic events.  She 's done an excellent job with weight control.  She's lost 32 pounds in the last 7 months.  She is sticking to a fairly strict 1200-calorie diet with 90 g of protein a day.  She had a recent fall in her kitchen.  This was not preceded  by dizziness and she did not have loss of consciousness.  Sounds like a mechanical fall.  Had some pain in the back improved after injections at [facility].   Her edema is greatly improved and she is not taking any diuretics at this time.  6  The patient specifically denies any chest pain at rest exertion, dyspnea at rest or with exertion, orthopnea, paroxysmal nocturnal dyspnea, syncope, palpitations, focal neurological deficits, intermittent claudication, worsening lower extremity edema, unexplained weight gain, cough, hemoptysis or wheezing.   She had gastric bypass surgery in 2009.  She has never had diabetes mellitus.  Her most recent LDL cholesterol was 128, similar to last year, she has an exceptional HDL of 139.Marland Kitchen  She does not smoke and has never done so.  She has no history of CAD or PAD.  Mild to moderate atherosclerotic plaque was described in the abdominal aorta on CT 2022.  It has been 5 years since her breast surgery and is scheduled to have her last visit to the cancer center in November.  She is no longer on hormonal therapy.   Past Medical History:  Diagnosis Date   Arthritis    osteoarthritis   Asthma    states no asthma attack since 2002   Back pain    Breast cancer (HCC) 06/26/16 bx   left breast   Breast cancer (HCC)    Chronic back pain    Complication of anesthesia    states takes more than normal to put her to sleep   Constipation    Dental bridge present    upper   Dental crowns present  DVT of upper extremity (deep vein thrombosis) (HCC)    Fibromyalgia    Genetic testing 09/19/2016   Ms. Bogdon underwent genetic counseling and testing for hereditary cancer syndromes on 08/21/2016. Her results were negative for mutations in all 46 genes analyzed by [facility]'s 46-gene Common Hereditary Cancers Panel. Genes analyzed include: APC, ATM, AXIN2, BARD1, BMPR1A, BRCA1, BRCA2, BRIP1, CDH1, CDKN2A, CHEK2, CTNNA1, DICER1, EPCAM, GREM1, HOXB13, KIT, MEN1, MLH1,  MSH2, MSH3, MSH6, MUTYH, NBN,    GERD (gastroesophageal reflux disease)    History of blood transfusion 06/2005   History of gallstones    History of pneumonia    History of shingles 07/2011   History of shingles    HTN (hypertension)    Itching    Joint pain    Muscle weakness    Neuropathy    Normal coronary arteries 2003   Osteoarthropathy    Personal history of chemotherapy 2018   Personal history of radiation therapy 2018   PFO (patent foramen ovale)    Pneumonia    HX OF    Presence of inferior vena cava filter    Pulmonary embolism (HCC)    Sleep apnea    NOT SINCE 2009    Status post gastric bypass for obesity    Stomach pain    Stroke (HCC) 12/2002   right-sided weakness   Urinary incontinence     Past Surgical History:  Procedure Laterality Date   ABDOMINAL HYSTERECTOMY  11/1997   complete   ANAL RECTAL MANOMETRY N/A 01/21/2019   Procedure: ANO RECTAL MANOMETRY;  Surgeon: Willis Modena, MD;  Location: [facility];  Service: Endoscopy;  Laterality: N/A;   ANTERIOR CERVICAL DECOMP/DISCECTOMY FUSION  02/05/2005   C5-6   APPENDECTOMY  10/22/2008   laparoscopic   BREAST LUMPECTOMY Left 07/2016   BREAST LUMPECTOMY WITH RADIOACTIVE SEED AND SENTINEL LYMPH NODE BIOPSY Left 07/26/2016   Procedure: BREAST LUMPECTOMY WITH RADIOACTIVE SEED AND SENTINEL LYMPH NODE BIOPSY;  Surgeon: Harriette Bouillon, MD;  Location: [facility];  Service: General;  Laterality: Left;   BUNIONECTOMY  05/1980   both feet   BUNIONECTOMY  08/2011   left foot   CARDIAC CATHETERIZATION  03/04/2002   CARPAL TUNNEL RELEASE  06/21/2009   right   CARPAL TUNNEL RELEASE     left hand   CARPAL TUNNEL RELEASE  10/03/2011   Procedure: CARPAL TUNNEL RELEASE;  Surgeon: Nicki Reaper, MD;  Location: [facility];  Service: Orthopedics;  Laterality: Right;  CARPAL TUNNEL WITH HYPOTHENAR FAT PAD TRANSFER   CERVICAL SPINE SURGERY  01/2005   titanium plate implanted   CHOLECYSTECTOMY  1990    ELBOW SURGERY  08/09/2004   decompression ulnar nerve right elbow   ENTEROLYSIS  10/22/2008   laparoscopic abd. enterolysis   GASTRIC ROUX-EN-Y  2009   HEEL SPUR SURGERY  08/1997   left   HEMORRHOID SURGERY  03/1993   LAPAROSCOPIC LYSIS INTESTINAL ADHESIONS  02/14/2000   NAILBED REPAIR  01/10/2005; 08/2011   exc. matrix bilat. great toe   OTHER SURGICAL HISTORY  12/1986   pt states that she had surgery to unclog her fallopean tubes   PORTACATH PLACEMENT N/A 09/24/2016   Procedure: INSERTION PORT-A-CATH WITH [name];  Surgeon: Harriette Bouillon, MD;  Location: [facility];  Service: General;  Laterality: N/A;   REVERSE SHOULDER ARTHROPLASTY Left 10/21/2020   Procedure: REVERSE SHOULDER ARTHROPLASTY;  Surgeon: Beverely Low, MD;  Location: [facility];  Service: Orthopedics;  Laterality: Left;  interscalene block  SHOULDER SURGERY     bilat. - (left:  06/2005)   TONSILLECTOMY  07/1995   TRIGGER FINGER RELEASE  04/25/2006   decompression A-1 pulley left thumb   TRIGGER FINGER RELEASE Right 11/11/2018   Procedure: RELEASE TRIGGER FINGER/A-1 PULLEY RIGHT THUMB, RIGHT RING FINGER;  Surgeon: Cindee Salt, MD;  Location: [facility];  Service: Orthopedics;  Laterality: Right;   UTERINE FIBROID SURGERY  12/95, 7/96   x2   VENA CAVA FILTER PLACEMENT  2009   during Roux-en-Y surg.    Current Medications: Current Meds  Medication Sig   albuterol (VENTOLIN HFA) 108 (90 Base) MCG/ACT inhaler Inhale 1 puff into the lungs every 4 (four) hours as needed.   aspirin EC 81 MG tablet Take 81 mg by mouth daily.   Calcium Citrate-Vitamin D (CALCIUM CITRATE +D PO) Take 2 tablets by mouth daily. Gummies   Cholecalciferol (VITAMIN D3) 1.25 MG (50000 UT) CAPS Take 1 capsule by mouth every 7 days   ferrous gluconate (FERGON) 324 MG tablet Take 1 tablet (324 mg total) by mouth daily with breakfast.   hydrOXYzine (ATARAX) 10 MG tablet TAKE 1 TABLET BY MOUTH ONCE DAILY AT BEDTIME IF NEEDED FOR ITCHING   lisinopril (ZESTRIL) 10  MG tablet Take 1 tablet (10 mg total) by mouth every morning.   methocarbamol (ROBAXIN) 750 MG tablet Take 1 tablet by mouth 3 times a day   Multiple Vitamins-Minerals (ALIVE ONCE DAILY WOMENS PO) Take 1 tablet by mouth daily.   sodium chloride (OCEAN) 0.65 % SOLN nasal spray Place 1 spray into both nostrils at bedtime.   solifenacin (VESICARE) 10 MG tablet Take 1 tablet (10 mg total) by mouth daily.   Trolamine Salicylate (BLUE-EMU HEMP EX) Apply 1 application topically daily.   valACYclovir (VALTREX) 500 MG tablet Take 1 tablet (500 mg total) by mouth 2 (two) times daily as needed.     Allergies:   Aspirin, Oxycodone hcl, Propoxyphene n-acetaminophen, Tramadol, Adhesive [tape], and Prednisone   Social History   Socioeconomic History   Marital status: Single    Spouse name: Not on file   Number of children: 0   Years of education: Not on file   Highest education level: Not on file  Occupational History   Occupation: disabled  Tobacco Use   Smoking status: Former   Smokeless tobacco: Never   Tobacco comments:    quit smoking 08/1989  Vaping Use   Vaping status: Never Used  Substance and Sexual Activity   Alcohol use: No   Drug use: No   Sexual activity: Not Currently    Birth control/protection: Surgical  Other Topics Concern   Not on file  Social History Narrative   Not on file   Social Determinants of Health   Financial Resource Strain: Not on file  Food Insecurity: Not on file  Transportation Needs: Not on file  Physical Activity: Not on file  Stress: Not on file  Social Connections: Not on file     Family History: The patient's family history includes Breast cancer in her mother and sister; Breast cancer (age of onset: 81) in her paternal aunt; Breast cancer (age of onset: 23) in her sister; Cervical cancer (age of onset: 37) in her paternal grandmother; Colon polyps in her father; Diabetes in her father; High blood pressure in her father; Hypertension in her  mother and another family member; Ovarian cancer (age of onset: 81) in her maternal grandmother; Prostate cancer in her father.  ROS:  Please see the history of present illness.    All other systems are reviewed and are negative.  EKGs/Labs/Other Studies Reviewed:    The following studies were reviewed today: Reviewed echo from 2017, nuclear stress test 2017, lower extremity arterial Dopplers 2012, multiple CT studies including CT of the abdomen, CT of the chest, CTs of the brain and MRI of the brain in 2004  EKG:  EKG is ordered today and shows mild sinus bradycardia, otherwise normal. Recent Labs: 10/30/2022: ALT 18; BUN 19; Creatinine 0.7; Hemoglobin 12.4; Platelets 200; Potassium 4.0; Sodium 134; TSH 0.59  Recent Lipid Panel    Component Value Date/Time   CHOL 216 (A) 10/30/2022 1210   CHOL 218 (H) 11/26/2019 1359   TRIG 66 10/30/2022 1210   HDL 139 (A) 10/30/2022 1210   HDL 82 11/26/2019 1359   CHOLHDL 2.5 11/07/2018 0914   CHOLHDL 2.6 09/30/2015 1838   VLDL 12 09/30/2015 1838   LDLCALC 128 10/30/2022 1210   LDLCALC 123 (H) 11/26/2019 1359    Physical Exam:    VS:  BP 114/70 (BP Location: Right Arm, Patient Position: Sitting, Cuff Size: Large)   Pulse (!) 57   Ht 5\' 1"  (1.549 m)   Wt 178 lb 3.2 oz (80.8 kg)   SpO2 98%   BMI 33.67 kg/m     Wt Readings from Last 3 Encounters:  11/28/22 178 lb 3.2 oz (80.8 kg)  10/31/22 173 lb (78.5 kg)  05/30/22 188 lb (85.3 kg)      General: Alert, oriented x3, no distress, mildly obese.   Head: no evidence of trauma, PERRL, EOMI, no exophtalmos or lid lag, no myxedema, no xanthelasma; normal ears, nose and oropharynx Neck: normal jugular venous pulsations and no hepatojugular reflux; brisk carotid pulses without delay and no carotid bruits Chest: clear to auscultation, no signs of consolidation by percussion or palpation, normal fremitus, symmetrical and full respiratory excursions Cardiovascular: normal position and quality of  the apical impulse, regular rhythm, normal first and second heart sounds, no murmurs, rubs or gallops Abdomen: no tenderness or distention, no masses by palpation, no abnormal pulsatility or arterial bruits, normal bowel sounds, no hepatosplenomegaly Extremities: no clubbing, cyanosis, trivial ankle edema, symmetrical ; 2+ radial, ulnar and brachial pulses bilaterally; 2+ right femoral, posterior tibial and dorsalis pedis pulses; 2+ left femoral, posterior tibial and dorsalis pedis pulses; no subclavian or femoral bruits Neurological: grossly nonfocal Psych: Normal mood and affect    ASSESSMENT:    1. History of stroke   2. PFO (patent foramen ovale)   3. History of pulmonary embolus (PE)   4. Presence of IVC filter   5. Aortic atherosclerosis (HCC)   6. Hypercholesterolemia   7. Obesity, mild   8. Essential hypertension      PLAN:    In order of problems listed above:  History of stroke/suspected paradoxical embolism due to PFO: No new events in 18 years. On aspirin. At this point it is difficult to argue for PFO closure such a long period of time without events.  Any potential embolic event, even a TIA should prompt reconsideration of PFO closure with a device.  Note that we would have to traverse her inferior vena cava filter from a femoral approach, but the device could be deployed via subclavian approach as well. 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  No recent events and only on aspirin.  As above, any recurrent venous thromboembolic event would probably prompt an indication for lifelong  anticoagulation.  IVC filter: At this point this is likely completely endothelialized and will be difficult to remove; hopefully also less likely to be a nidus for thrombus formation. Ao atherosclerosis: Ideally would want to bring her LDL cholesterol is than 70, but she does not want to take medications.  Very high, protective HDL level.  She has moderate  nonobstructive atheromatous disease in the abdominal aorta, but no evidence of meaningful disease in the coronaries, carotids, thoracic aorta or lower extremity arterial system on multiple past imaging studies.medications.   HLP: She has a mildly elevated LDL cholesterol but this is balanced by an exceptionally high level of HDL.  In the absence of significant vascular disease would not recommend statin therapy.  She has predominantly buttock and thigh obesity, without that much in the way of abdominal fat, suggesting a less aggressive genetic/metabolic risk for vascular disease.  This is supported by the fact that she has excellent glucose levels despite morbid obesity.  Obesity: she has done an excellent job with diet. Need to consolidate this with a program of regular exercise. HTN:  She has a excellent blood pressure on lisinopril. History of left breast cancer status post radiation therapy: While chest radiation can increase the likelihood of coronary disease, this is unlikely to show up for several more years in the future.    Medication Adjustments/Labs and Tests Ordered: Current medicines are reviewed at length with the patient today.  Concerns regarding medicines are outlined above.  Orders Placed This Encounter  Procedures   EKG 12-Lead   No orders of the defined types were placed in this encounter.   Patient Instructions  Medication Instructions:  No changes *If you need a refill on your cardiac medications before your next appointment, please call your pharmacy*  Follow-Up: At [facility], you and your health needs are our priority.  As part of our continuing mission to provide you with exceptional heart care, we have created designated Provider Care Teams.  These Care Teams include your primary Cardiologist (physician) and Advanced Practice Providers (APPs -  Physician Assistants and Nurse Practitioners) who all work together to provide you with the care you need, when  you need it.  We recommend signing up for the patient portal called "MyChart".  Sign up information is provided on this After Visit Summary.  MyChart is used to connect with patients for Virtual Visits (Telemedicine).  Patients are able to view lab/test results, encounter notes, upcoming appointments, etc.  Non-urgent messages can be sent to your provider as well.   To learn more about what you can do with MyChart, go to [email or web address].    Your next appointment:   1 year(s)  Provider:   Thurmon Fair, MD       Signed, Thurmon Fair, MD  12/02/2022 4:13 PM    [facility]

## 2022-11-28 NOTE — Patient Instructions (Signed)
Medication Instructions:  No changes *If you need a refill on your cardiac medications before your next appointment, please call your pharmacy*  Follow-Up: At [facility], you and your health needs are our priority.  As part of our continuing mission to provide you with exceptional heart care, we have created designated Provider Care Teams.  These Care Teams include your primary Cardiologist (physician) and Advanced Practice Providers (APPs -  Physician Assistants and Nurse Practitioners) who all work together to provide you with the care you need, when you need it.  We recommend signing up for the patient portal called "MyChart".  Sign up information is provided on this After Visit Summary.  MyChart is used to connect with patients for Virtual Visits (Telemedicine).  Patients are able to view lab/test results, encounter notes, upcoming appointments, etc.  Non-urgent messages can be sent to your provider as well.   To learn more about what you can do with MyChart, go to [email or web address].    Your next appointment:   1 year(s)  Provider:   Mihai Croitoru, MD     

## 2022-12-03 DIAGNOSIS — M25551 Pain in right hip: Secondary | ICD-10-CM | POA: Diagnosis not present

## 2022-12-03 DIAGNOSIS — M5412 Radiculopathy, cervical region: Secondary | ICD-10-CM | POA: Diagnosis not present

## 2022-12-26 ENCOUNTER — Other Ambulatory Visit (HOSPITAL_BASED_OUTPATIENT_CLINIC_OR_DEPARTMENT_OTHER): Payer: Self-pay

## 2023-01-07 DIAGNOSIS — M25551 Pain in right hip: Secondary | ICD-10-CM | POA: Diagnosis not present

## 2023-01-14 ENCOUNTER — Other Ambulatory Visit (HOSPITAL_BASED_OUTPATIENT_CLINIC_OR_DEPARTMENT_OTHER): Payer: Self-pay

## 2023-01-14 ENCOUNTER — Encounter: Payer: Self-pay | Admitting: Hematology and Oncology

## 2023-01-14 DIAGNOSIS — J069 Acute upper respiratory infection, unspecified: Secondary | ICD-10-CM | POA: Diagnosis not present

## 2023-01-14 DIAGNOSIS — R300 Dysuria: Secondary | ICD-10-CM | POA: Diagnosis not present

## 2023-01-14 DIAGNOSIS — R519 Headache, unspecified: Secondary | ICD-10-CM | POA: Diagnosis not present

## 2023-01-14 MED ORDER — ONDANSETRON HCL 8 MG PO TABS
8.0000 mg | ORAL_TABLET | Freq: Every day | ORAL | 0 refills | Status: DC | PRN
  Filled 2023-01-14: qty 10, 10d supply, fill #0

## 2023-01-14 MED ORDER — LORATADINE 10 MG PO TABS
10.0000 mg | ORAL_TABLET | Freq: Every day | ORAL | 0 refills | Status: DC
  Filled 2023-01-14 – 2023-10-02 (×3): qty 30, 30d supply, fill #0

## 2023-01-14 MED ORDER — NAPROXEN 500 MG PO TABS
500.0000 mg | ORAL_TABLET | Freq: Two times a day (BID) | ORAL | 0 refills | Status: DC | PRN
  Filled 2023-01-14: qty 10, 5d supply, fill #0

## 2023-01-18 ENCOUNTER — Other Ambulatory Visit (HOSPITAL_BASED_OUTPATIENT_CLINIC_OR_DEPARTMENT_OTHER): Payer: Self-pay

## 2023-01-18 ENCOUNTER — Other Ambulatory Visit (HOSPITAL_COMMUNITY): Payer: Self-pay

## 2023-01-18 MED ORDER — NAPROXEN 500 MG PO TABS
500.0000 mg | ORAL_TABLET | Freq: Two times a day (BID) | ORAL | 0 refills | Status: DC | PRN
  Filled 2023-01-18 (×2): qty 20, 10d supply, fill #0

## 2023-01-19 ENCOUNTER — Other Ambulatory Visit (HOSPITAL_COMMUNITY): Payer: Self-pay

## 2023-01-22 ENCOUNTER — Other Ambulatory Visit (HOSPITAL_BASED_OUTPATIENT_CLINIC_OR_DEPARTMENT_OTHER): Payer: Self-pay

## 2023-01-22 MED ORDER — AMOXICILLIN 875 MG PO TABS
875.0000 mg | ORAL_TABLET | Freq: Two times a day (BID) | ORAL | 0 refills | Status: DC
  Filled 2023-01-22: qty 14, 7d supply, fill #0

## 2023-01-31 ENCOUNTER — Other Ambulatory Visit (HOSPITAL_BASED_OUTPATIENT_CLINIC_OR_DEPARTMENT_OTHER): Payer: Self-pay

## 2023-01-31 MED ORDER — FLULAVAL 0.5 ML IM SUSY
0.5000 mL | PREFILLED_SYRINGE | Freq: Once | INTRAMUSCULAR | 0 refills | Status: AC
  Filled 2023-01-31: qty 0.5, 1d supply, fill #0

## 2023-01-31 MED ORDER — COMIRNATY 30 MCG/0.3ML IM SUSY
0.3000 mL | PREFILLED_SYRINGE | Freq: Once | INTRAMUSCULAR | 0 refills | Status: AC
  Filled 2023-01-31: qty 0.3, 1d supply, fill #0

## 2023-02-03 ENCOUNTER — Other Ambulatory Visit (HOSPITAL_COMMUNITY): Payer: Self-pay

## 2023-02-03 ENCOUNTER — Other Ambulatory Visit: Payer: Self-pay | Admitting: Bariatrics

## 2023-02-03 DIAGNOSIS — D508 Other iron deficiency anemias: Secondary | ICD-10-CM

## 2023-02-04 ENCOUNTER — Other Ambulatory Visit: Payer: Self-pay

## 2023-02-04 ENCOUNTER — Encounter: Payer: Self-pay | Admitting: Hematology and Oncology

## 2023-02-04 ENCOUNTER — Other Ambulatory Visit (HOSPITAL_COMMUNITY): Payer: Self-pay

## 2023-02-04 MED ORDER — FERROUS GLUCONATE 324 (38 FE) MG PO TABS
324.0000 mg | ORAL_TABLET | Freq: Every day | ORAL | 0 refills | Status: DC
  Filled 2023-02-04 – 2023-02-07 (×2): qty 100, 100d supply, fill #0

## 2023-02-05 ENCOUNTER — Other Ambulatory Visit: Payer: Self-pay

## 2023-02-05 ENCOUNTER — Other Ambulatory Visit (HOSPITAL_BASED_OUTPATIENT_CLINIC_OR_DEPARTMENT_OTHER): Payer: Self-pay

## 2023-02-05 ENCOUNTER — Other Ambulatory Visit (HOSPITAL_COMMUNITY): Payer: Self-pay

## 2023-02-05 ENCOUNTER — Encounter: Payer: Self-pay | Admitting: Pharmacist

## 2023-02-05 MED ORDER — METHOCARBAMOL 750 MG PO TABS
750.0000 mg | ORAL_TABLET | Freq: Three times a day (TID) | ORAL | 1 refills | Status: DC
  Filled 2023-02-05 – 2023-02-07 (×2): qty 60, 20d supply, fill #0
  Filled 2023-10-01: qty 60, 20d supply, fill #1

## 2023-02-05 MED ORDER — LISINOPRIL 10 MG PO TABS
10.0000 mg | ORAL_TABLET | Freq: Every morning | ORAL | 0 refills | Status: DC
  Filled 2023-02-05 – 2023-02-07 (×2): qty 90, 90d supply, fill #0

## 2023-02-05 MED ORDER — VALACYCLOVIR HCL 500 MG PO TABS
500.0000 mg | ORAL_TABLET | Freq: Two times a day (BID) | ORAL | 0 refills | Status: DC | PRN
  Filled 2023-02-05 – 2023-02-07 (×2): qty 180, 90d supply, fill #0

## 2023-02-05 MED ORDER — ALBUTEROL SULFATE HFA 108 (90 BASE) MCG/ACT IN AERS
1.0000 | INHALATION_SPRAY | RESPIRATORY_TRACT | 0 refills | Status: DC | PRN
  Filled 2023-02-05 – 2023-02-07 (×2): qty 6.7, 50d supply, fill #0

## 2023-02-05 MED ORDER — SOLIFENACIN SUCCINATE 10 MG PO TABS
10.0000 mg | ORAL_TABLET | Freq: Every day | ORAL | 0 refills | Status: DC
  Filled 2023-02-05 – 2023-02-07 (×2): qty 90, 90d supply, fill #0

## 2023-02-07 ENCOUNTER — Encounter: Payer: Self-pay | Admitting: Hematology and Oncology

## 2023-02-07 ENCOUNTER — Other Ambulatory Visit: Payer: Self-pay

## 2023-02-07 ENCOUNTER — Other Ambulatory Visit (HOSPITAL_COMMUNITY): Payer: Self-pay

## 2023-02-07 ENCOUNTER — Other Ambulatory Visit (HOSPITAL_BASED_OUTPATIENT_CLINIC_OR_DEPARTMENT_OTHER): Payer: Self-pay

## 2023-02-07 ENCOUNTER — Encounter: Payer: Self-pay | Admitting: Pharmacist

## 2023-02-14 DIAGNOSIS — Z6832 Body mass index (BMI) 32.0-32.9, adult: Secondary | ICD-10-CM | POA: Diagnosis not present

## 2023-02-14 DIAGNOSIS — M25551 Pain in right hip: Secondary | ICD-10-CM | POA: Diagnosis not present

## 2023-03-04 DIAGNOSIS — Z01419 Encounter for gynecological examination (general) (routine) without abnormal findings: Secondary | ICD-10-CM | POA: Diagnosis not present

## 2023-03-04 DIAGNOSIS — N816 Rectocele: Secondary | ICD-10-CM | POA: Diagnosis not present

## 2023-03-04 DIAGNOSIS — Z124 Encounter for screening for malignant neoplasm of cervix: Secondary | ICD-10-CM | POA: Diagnosis not present

## 2023-03-06 ENCOUNTER — Other Ambulatory Visit (HOSPITAL_BASED_OUTPATIENT_CLINIC_OR_DEPARTMENT_OTHER): Payer: Self-pay

## 2023-03-06 MED ORDER — DIAZEPAM 5 MG PO TABS
ORAL_TABLET | ORAL | 0 refills | Status: DC
  Filled 2023-03-06 – 2023-03-29 (×2): qty 1, 1d supply, fill #0

## 2023-03-11 DIAGNOSIS — M5416 Radiculopathy, lumbar region: Secondary | ICD-10-CM | POA: Diagnosis not present

## 2023-03-18 ENCOUNTER — Other Ambulatory Visit (HOSPITAL_BASED_OUTPATIENT_CLINIC_OR_DEPARTMENT_OTHER): Payer: Self-pay

## 2023-03-18 DIAGNOSIS — M7061 Trochanteric bursitis, right hip: Secondary | ICD-10-CM | POA: Diagnosis not present

## 2023-03-18 DIAGNOSIS — M1712 Unilateral primary osteoarthritis, left knee: Secondary | ICD-10-CM | POA: Diagnosis not present

## 2023-03-18 MED ORDER — DICLOFENAC SODIUM 75 MG PO TBEC
75.0000 mg | DELAYED_RELEASE_TABLET | Freq: Two times a day (BID) | ORAL | 1 refills | Status: DC
  Filled 2023-03-18: qty 60, 30d supply, fill #0
  Filled 2023-10-01: qty 60, 30d supply, fill #1

## 2023-03-28 ENCOUNTER — Inpatient Hospital Stay: Payer: Medicare Other | Attending: Hematology and Oncology | Admitting: Hematology and Oncology

## 2023-03-28 VITALS — BP 156/71 | HR 65 | Temp 97.9°F | Resp 18 | Ht 61.0 in | Wt 174.9 lb

## 2023-03-28 DIAGNOSIS — C50512 Malignant neoplasm of lower-outer quadrant of left female breast: Secondary | ICD-10-CM | POA: Insufficient documentation

## 2023-03-28 DIAGNOSIS — Z79899 Other long term (current) drug therapy: Secondary | ICD-10-CM | POA: Insufficient documentation

## 2023-03-28 DIAGNOSIS — M7918 Myalgia, other site: Secondary | ICD-10-CM | POA: Diagnosis not present

## 2023-03-28 DIAGNOSIS — K5900 Constipation, unspecified: Secondary | ICD-10-CM | POA: Insufficient documentation

## 2023-03-28 DIAGNOSIS — Z170 Estrogen receptor positive status [ER+]: Secondary | ICD-10-CM | POA: Insufficient documentation

## 2023-03-28 DIAGNOSIS — Z79811 Long term (current) use of aromatase inhibitors: Secondary | ICD-10-CM | POA: Diagnosis not present

## 2023-03-28 NOTE — Assessment & Plan Note (Signed)
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 Adjuvant chemotherapy with Adriamycin/Cytoxan x 4, then weekly Taxol x 12 completed 04/19/17 2. Adjuvant radiation therapy completed on 07/26/2017 3. Adjuvant antiestrogen therapy started 08/28/17 -------------------------------------------------------------------------------------------------------------------------------- Plan: Adj Anti estrogen therapy with Letrozole 2.5 mg daily switched to Anastrozole, bone pain and joint pain even on exemestane Switching to tamoxifen (she will start at 5 mg daily starting 12/03/2020) with the plan to slowly titrate the dosage upwards.   Chemo-induced peripheral neuropathy: resumed gabapentin  Breast cancer surveillance: 1.  Breast exam 03/28/2023: Benign 2.  Mammogram: Scheduled for 04/04/2023 3. Breast MRI 09/26/2022 benign Density Cat D.  (Recommend annual MRIs) 4. CT Abd and pelvis 07/28/20: Post surgical changes   Hospitalization: 07/28/2020-07/31/2018: Generalized abdominal pain, enteritis, GI bleeding Hospitalization: 10/21/20-10/22/20: rev shoulder arthroplasty   5 mg of tamoxifen: side effects:  fatigue, joint pains She is willing to continue with the tamoxifen at 5 mg daily. She will take over-the-counter B12 supplement to see if it provides her more energy.   Bone density 11/28/2020: T score -2: Osteopenia: Currently on calcium and vitamin D.  We discussed pros and cons of bisphosphonate therapy and decided to hold off on those at this time.  With tamoxifen on board hopefully her bone density will remain stable in 2 years. Bone density will need to be repeated   Return to clinic in 1 year for follow-up

## 2023-03-28 NOTE — Progress Notes (Signed)
Patient Care Team: Noberto Retort, MD as PCP - General (Family Medicine) Croitoru, Rachelle Hora, MD as PCP - Cardiology (Cardiology) Serena Croissant, MD as Consulting Physician (Hematology and Oncology) Dorothy Puffer, MD as Consulting Physician (Radiation Oncology) Harriette Bouillon, MD as Consulting Physician (General Surgery) Axel Filler Larna Daughters, NP as Nurse Practitioner (Hematology and Oncology)  DIAGNOSIS:  Encounter Diagnosis  Name Primary?   Malignant neoplasm of lower-outer quadrant of left breast of female, estrogen receptor positive (HCC) Yes    SUMMARY OF ONCOLOGIC HISTORY: Oncology History  Malignant neoplasm of lower-outer quadrant of left breast of female, estrogen receptor positive (HCC)  06/26/2016 Initial Diagnosis   Left breast biopsy 3:00: IDC with DCIS, grade 3, ER 80%, PR 20%, Ki-67 60%, HER-2 negative ratio 1.18; palpable lump: 1.8 cm lesion, no lymph nodes, T1 cN0 stage I a clinical stage   07/26/2016 Surgery   Left lumpectomy: IDC 1.8 cm, with DCIS, margins negative, 0/2 lymph nodes, ER 80%, PR 20%, HER-2 negative ratio 1.18, Ki-67 60%, T1cN0 stage IA    08/21/2016 Oncotype testing   Oncotype DX recurrence score 51, risk of recurrence 34%   08/21/2016 Genetic Testing   Genetic counseling and testing for hereditary cancer syndromes performed on 08/21/2016. Results are negative for pathogenic mutations in 46 genes analyzed by [facility] Common Hereditary Cancers Panel. Results are dated 09/14/2016. Genes tested: APC, ATM, AXIN2, BARD1, BMPR1A, BRCA1, BRCA2, BRIP1, CDH1, CDKN2A, CHEK2, CTNNA1, DICER1, EPCAM, GREM1, HOXB13, KIT, MEN1, MLH1, MSH2, MSH3, MSH6, MUTYH, NBN, NF1, NTHL1, PALB2, PDGFRA, PMS2, POLD1, POLE, PTEN, RAD50, RAD51C, RAD51D, SDHA, SDHB, SDHC, SDHD, SMAD4, SMARCA4, STK11, TP53, TSC1, TSC2, and VHL.     11/02/2016 - 04/19/2017 Chemotherapy   Dose dense Adriamycin and Cytoxan 4 followed by Taxol weekly 12   06/10/2017 - 07/26/2017 Radiation Therapy    Adjuvant radiation with Dr. Mitzi Hansen   08/2017 -  Anti-estrogen oral therapy   Letrozole daily switched to Anastrozole (due to muscle pains)     CHIEF COMPLIANT: Surveillance of breast cancer  HISTORY OF PRESENT ILLNESS:   History of Present Illness   The patient, with a history of breast cancer, presents for a follow-up visit after completing treatment. She reports persistent neuropathy, which worsened after attempting to discontinue vitamin B12 supplementation. She also experiences chronic aches and pains, requiring periodic injections in the hip, back, and neck.  The patient also reports persistent chest soreness and occasional sharp pain, described as feeling like a 'cattle prod.' Despite these symptoms, she expresses readiness to complete her treatment journey.  In addition to her cancer-related symptoms, the patient has been managing her weight and has successfully lost 30 pounds over the past year. She attributes this success to a low-carb diet and a commitment to healthy eating, despite struggles with hip and back pain that limit her ability to exercise. She expresses a goal to potentially discontinue her blood pressure medication with further weight loss.         ALLERGIES:  is allergic to aspirin, oxycodone hcl, propoxyphene n-acetaminophen, tramadol, adhesive [tape], and prednisone.  MEDICATIONS:  Current Outpatient Medications  Medication Sig Dispense Refill   albuterol (VENTOLIN HFA) 108 (90 Base) MCG/ACT inhaler Inhale 1 puff into the lungs every 4 (four) hours as needed. 6.7 g 0   amoxicillin (AMOXIL) 875 MG tablet Take 1 tablet (875 mg total) by mouth every 12 (twelve) hours for 7 days 14 tablet 0   aspirin EC 81 MG tablet Take 81 mg by mouth daily.  Calcium Citrate-Vitamin D (CALCIUM CITRATE +D PO) Take 2 tablets by mouth daily. Gummies     Cholecalciferol (VITAMIN D3) 1.25 MG (50000 UT) CAPS Take 1 capsule by mouth every 7 days 12 capsule 0   diazepam (VALIUM) 5 MG  tablet Take 1 tablet (5 mg total) by mouth 45 minutes before procedure. Repeat 20 minutes before procedure if needed. (Patient not taking: Reported on 11/28/2022) 2 tablet 0   diazepam (VALIUM) 5 MG tablet Take 1 tablet by mouth 30-45 minutes prior to procedure 1 tablet 0   diclofenac (VOLTAREN) 75 MG EC tablet Take 1 tablet (75 mg total) by mouth 2 (two) times daily with a meal. 60 tablet 1   ferrous gluconate (FERGON) 324 MG tablet Take 1 tablet (324 mg total) by mouth daily with breakfast. 100 tablet 0   hydrOXYzine (ATARAX) 10 MG tablet TAKE 1 TABLET BY MOUTH ONCE DAILY AT BEDTIME IF NEEDED FOR ITCHING 90 tablet 1   lisinopril (ZESTRIL) 10 MG tablet Take 1 tablet (10 mg total) by mouth in the morning. 90 tablet 0   loratadine (CLARITIN) 10 MG tablet Take 1 tablet (10 mg total) by mouth daily. 30 tablet 0   methocarbamol (ROBAXIN) 750 MG tablet Take 1 tablet (750 mg total) by mouth 3 (three) times daily. 60 tablet 1   Multiple Vitamins-Minerals (ALIVE ONCE DAILY WOMENS PO) Take 1 tablet by mouth daily.     naproxen (NAPROSYN) 500 MG tablet Take 1 tablet (500 mg total) by mouth every 12 (twelve) hours as needed. Take with food or milk. 10 tablet 0   naproxen (NAPROSYN) 500 MG tablet Take 1 tablet (500 mg total) by mouth with food or milk every 12 (twelve) hours as needed. 20 tablet 0   ondansetron (ZOFRAN) 8 MG tablet Take 1 tablet (8 mg total) by mouth daily as needed. 10 tablet 0   sodium chloride (OCEAN) 0.65 % SOLN nasal spray Place 1 spray into both nostrils at bedtime.     solifenacin (VESICARE) 10 MG tablet Take 1 tablet (10 mg total) by mouth daily. 90 tablet 0   Trolamine Salicylate (BLUE-EMU HEMP EX) Apply 1 application topically daily.     valACYclovir (VALTREX) 500 MG tablet Take 1 tablet (500 mg total) by mouth 2 (two) times daily as needed. 180 tablet 0   No current facility-administered medications for this visit.    PHYSICAL EXAMINATION: ECOG PERFORMANCE STATUS: 1 - Symptomatic  but completely ambulatory  Vitals:   03/28/23 1025  BP: (!) 156/71  Pulse: 65  Resp: 18  Temp: 97.9 F (36.6 C)  SpO2: 100%   Filed Weights   03/28/23 1025  Weight: 174 lb 14.4 oz (79.3 kg)      LABORATORY DATA:  I have reviewed the data as listed    Latest Ref Rng & Units 10/30/2022   12:10 PM 04/24/2022   12:00 AM 10/21/2020   10:51 AM  CMP  Glucose 70 - 99 mg/dL   89   BUN 4 - 21 19  21     24    Creatinine 0.5 - 1.1 0.7  0.8     0.70   Sodium 137 - 147 134  140     131   Potassium 3.5 - 5.1 mEq/L 4.0  4.9     7.6   Chloride 99 - 108 99  103     101   CO2 13 - 22 28  30        Calcium 8.7 -  10.7 9.5  9.8       AST 13 - 35 18  18       ALT 7 - 35 U/L 18  13          This result is from an external source.    Lab Results  Component Value Date   WBC 5.7 10/30/2022   HGB 12.4 10/30/2022   HCT 37 10/30/2022   MCV 95.8 10/21/2020   PLT 200 10/30/2022   NEUTROABS 8.1 (H) 07/28/2020    ASSESSMENT & PLAN:  Malignant neoplasm of lower-outer quadrant of left breast of female, estrogen receptor positive (HCC) 07/26/2016: Left lumpectomy: IDC 1.8 cm, with DCIS, margins negative, 0/2 lymph nodes, ER 80%, PR 20%, HER-2 negative ratio 1.18, Ki-67 60%, T1cN0 stage IA   Oncotype DX score 51 : 10 year risk of recurrence greater than 34%, extremely high risk   Treatment Summary: 1. Adjuvant chemotherapy with Adriamycin/Cytoxan x 4, then weekly Taxol x 12 completed 04/19/17 2. Adjuvant radiation therapy completed on 07/26/2017 3. Adjuvant antiestrogen therapy started 08/28/17 -------------------------------------------------------------------------------------------------------------------------------- Plan: Adj Anti estrogen therapy with Letrozole 2.5 mg daily switched to Anastrozole, bone pain and joint pain even on exemestane Switching to tamoxifen (she will start at 5 mg daily starting 12/03/2020) completed April 2024   Chemo-induced peripheral neuropathy: resumed gabapentin   Breast cancer surveillance: 1.  Mammogram: Scheduled for 04/04/2023 2. Breast MRI 09/26/2022 benign Density Cat D.  (Recommend annual MRIs) 3. CT Abd and pelvis 07/28/20: Post surgical changes   Hospitalization: 07/28/2020-07/31/2018: Generalized abdominal pain, enteritis, GI bleeding Hospitalization: 10/21/20-10/22/20: rev shoulder arthroplasty   She will take over-the-counter B12 supplement to see if it provides her more energy.   Bone density 11/28/2020: T score -2: Osteopenia: Currently on calcium and vitamin D.  We discussed pros and cons of bisphosphonate therapy and decided to hold off on those at this time.  With tamoxifen on board hopefully her bone density will remain stable in 2 years. Bone density will need to be repeated   Return to clinic in 1 year for follow-up   ------------------------------------- Assessment and Plan    Breast Cancer Completed tamoxifen treatment in April. No new symptoms related to breast cancer. Recent breast exam and mammogram scheduled for next week. -No further treatment needed at this time. -Continue regular screenings and follow-ups as scheduled.  Neuropathy Persistent symptoms despite B12 supplementation. -Continue B12 supplementation.  Musculoskeletal Pain Receiving periodic injections for pain in hip, back, and neck. -Continue current pain management strategy.  Constipation Recently started daily Metamucil. -Continue Metamucil as needed.  Weight Management Successful weight loss of 30 pounds through dietary changes and exercise. -Encourage continued healthy lifestyle habits. -Consider potential for discontinuation of blood pressure medication with further weight loss.  General Health Maintenance -Continue current medications and supplements. -Complete blood work scheduled for December. -Encourage consistent exercise as tolerated.      Return to clinic on an as-needed basis    No orders of the defined types were placed in this  encounter.  The patient has a good understanding of the overall plan. she agrees with it. she will call with any problems that may develop before the next visit here. Total time spent: 30 mins including face to face time and time spent for planning, charting and co-ordination of care   Tamsen Meek, MD 03/28/23

## 2023-03-29 ENCOUNTER — Other Ambulatory Visit (HOSPITAL_BASED_OUTPATIENT_CLINIC_OR_DEPARTMENT_OTHER): Payer: Self-pay

## 2023-04-01 DIAGNOSIS — M5412 Radiculopathy, cervical region: Secondary | ICD-10-CM | POA: Diagnosis not present

## 2023-04-04 ENCOUNTER — Other Ambulatory Visit (HOSPITAL_BASED_OUTPATIENT_CLINIC_OR_DEPARTMENT_OTHER): Payer: Self-pay

## 2023-04-04 ENCOUNTER — Encounter: Payer: Self-pay | Admitting: Bariatrics

## 2023-04-04 ENCOUNTER — Ambulatory Visit: Payer: Medicare Other | Admitting: Bariatrics

## 2023-04-04 ENCOUNTER — Ambulatory Visit
Admission: RE | Admit: 2023-04-04 | Discharge: 2023-04-04 | Disposition: A | Discharge status: Home / Self Care | Payer: Medicare Other | Source: Ambulatory Visit | Attending: Family Medicine | Admitting: Family Medicine

## 2023-04-04 ENCOUNTER — Encounter: Payer: Self-pay | Admitting: Hematology and Oncology

## 2023-04-04 VITALS — BP 107/71 | HR 59 | Temp 98.0°F | Ht 61.0 in | Wt 172.0 lb

## 2023-04-04 DIAGNOSIS — E669 Obesity, unspecified: Secondary | ICD-10-CM | POA: Diagnosis not present

## 2023-04-04 DIAGNOSIS — Z6832 Body mass index (BMI) 32.0-32.9, adult: Secondary | ICD-10-CM | POA: Diagnosis not present

## 2023-04-04 DIAGNOSIS — Z1231 Encounter for screening mammogram for malignant neoplasm of breast: Secondary | ICD-10-CM | POA: Diagnosis not present

## 2023-04-04 DIAGNOSIS — E66811 Obesity, class 1: Secondary | ICD-10-CM

## 2023-04-04 DIAGNOSIS — E559 Vitamin D deficiency, unspecified: Secondary | ICD-10-CM

## 2023-04-04 DIAGNOSIS — R7303 Prediabetes: Secondary | ICD-10-CM | POA: Diagnosis not present

## 2023-04-04 MED ORDER — VITAMIN D3 1.25 MG (50000 UT) PO CAPS
50000.0000 [IU] | ORAL_CAPSULE | ORAL | 1 refills | Status: DC
Start: 2023-04-04 — End: 2023-09-11
  Filled 2023-04-04 – 2023-05-07 (×2): qty 12, 84d supply, fill #0

## 2023-04-04 NOTE — Progress Notes (Signed)
WEIGHT SUMMARY AND BIOMETRICS  Weight Lost Since Last Visit: 1lb  Weight Gained Since Last Visit: 0   Vitals Temp: 98 F (36.7 C) BP: 107/71 Pulse Rate: (!) 59 SpO2: 100 %   Anthropometric Measurements Height: 5\' 1"  (1.549 m) Weight: 172 lb (78 kg) BMI (Calculated): 32.52 Weight at Last Visit: 173lb Weight Lost Since Last Visit: 1lb Weight Gained Since Last Visit: 0 Starting Weight: 173lb   Body Composition  Body Fat %: 47.2 % Fat Mass (lbs): 81.2 lbs Muscle Mass (lbs): 86.2 lbs Visceral Fat Rating : 13   Other Clinical Data Fasting: no Labs: no Today's Visit #: 29 Starting Date: 02/18/18    OBESITY [name] is here to discuss her progress with her obesity treatment plan along with follow-up of her obesity related diagnoses.    Nutrition Plan: keeping a food journal with goal of 1200 calories and 80 grams of protein daily - 95% adherence.  Current exercise: none  Interim History:  She is down 1 lb since her last visit.  Eating all of the food on the plan., Protein intake is as prescribed, Is not drinking sugar sweetened beverages., Is not skipping meals, and Water intake is adequate.  Hunger is moderately controlled.  Cravings are moderately controlled.  Assessment/Plan:   1. Vitamin D deficiency Vitamin D Deficiency Vitamin D is not at goal of 50.  Most recent vitamin D level was 40.7. She is on  prescription ergocalciferol 50,000 IU weekly. Lab Results  Component Value Date   VD25OH 40.7 03/23/2020   VD25OH 40.5 11/26/2019   VD25OH 40.6 06/09/2019    Plan: Refill prescription vitamin D 50,000 IU weekly.     Previous Prediabetes She states that her last HgbA1c was less less than or equal to 5.7 Last A1c was 5.7  Medication(s): none  Lab Results  Component Value Date   HGBA1C 5.7 10/30/2022   HGBA1C 5.8 04/24/2022   HGBA1C  5.7 (H) 11/26/2019   HGBA1C 5.5 06/09/2019   HGBA1C 5.6 12/17/2018   Lab Results  Component Value Date   INSULIN 3.8 11/26/2019   INSULIN 7.0 06/09/2019   INSULIN 4.9 02/18/2018    Plan: Will minimize all refined carbohydrates both sweets and starches.  Will work on the plan and exercise.  Consider both aerobic and resistance training.  Will keep protein, water, and fiber intake high.  Aim for 7 to 9 hours of sleep nightly.  Will continue medications.  She continue to make soups and other dishes and using " My Fitness Pal".  Will read labels.    Generalized Obesity: Current BMI BMI (Calculated): 32.52    [name] is currently in the action stage of change. As such, her goal is to continue with weight loss efforts.  She has agreed to keeping a food journal with goal of 1,200 calories and 80 grams of protein daily.  Exercise goals: All adults should avoid inactivity. Some physical activity is better than none, and adults who participate in any amount of physical activity gain some health benefits.  Behavioral modification strategies: increasing lean protein intake, decreasing simple carbohydrates , no meal skipping, decrease eating out, increase water intake, better snacking choices, planning for success, increasing vegetables, increasing fiber rich foods, keep healthy foods in the home, weigh protein portions, and mindful eating.  [name] has agreed to follow-up with our clinic in 6 weeks.      Objective:   VITALS: Per patient if applicable, see vitals. GENERAL: Alert and in no acute distress. CARDIOPULMONARY: No increased WOB. Speaking in clear sentences.  PSYCH: Pleasant and cooperative. Speech normal rate and rhythm. Affect is appropriate. Insight and judgement are appropriate. Attention is focused, linear, and appropriate.  NEURO: Oriented as arrived to appointment on time with no prompting.   Attestation Statements:    This was prepared with the assistance of Licensed conveyancer.  Occasional wrong-word or sound-a-like substitutions may have occurred due to the inherent limitations of voice recognition   Corinna Capra, DO

## 2023-04-08 DIAGNOSIS — M25551 Pain in right hip: Secondary | ICD-10-CM | POA: Diagnosis not present

## 2023-04-08 DIAGNOSIS — M4807 Spinal stenosis, lumbosacral region: Secondary | ICD-10-CM | POA: Diagnosis not present

## 2023-04-08 DIAGNOSIS — M47816 Spondylosis without myelopathy or radiculopathy, lumbar region: Secondary | ICD-10-CM | POA: Diagnosis not present

## 2023-04-08 DIAGNOSIS — M48061 Spinal stenosis, lumbar region without neurogenic claudication: Secondary | ICD-10-CM | POA: Diagnosis not present

## 2023-04-15 ENCOUNTER — Other Ambulatory Visit (HOSPITAL_BASED_OUTPATIENT_CLINIC_OR_DEPARTMENT_OTHER): Payer: Self-pay

## 2023-04-25 ENCOUNTER — Other Ambulatory Visit (HOSPITAL_BASED_OUTPATIENT_CLINIC_OR_DEPARTMENT_OTHER): Payer: Self-pay

## 2023-04-25 DIAGNOSIS — M5416 Radiculopathy, lumbar region: Secondary | ICD-10-CM | POA: Diagnosis not present

## 2023-04-25 DIAGNOSIS — Z6833 Body mass index (BMI) 33.0-33.9, adult: Secondary | ICD-10-CM | POA: Diagnosis not present

## 2023-04-25 MED ORDER — DIAZEPAM 5 MG PO TABS
5.0000 mg | ORAL_TABLET | ORAL | 0 refills | Status: DC
  Filled 2023-04-25 – 2023-05-07 (×2): qty 1, 1d supply, fill #0

## 2023-04-30 DIAGNOSIS — I700 Atherosclerosis of aorta: Secondary | ICD-10-CM | POA: Diagnosis not present

## 2023-04-30 DIAGNOSIS — I10 Essential (primary) hypertension: Secondary | ICD-10-CM | POA: Diagnosis not present

## 2023-04-30 DIAGNOSIS — Z0000 Encounter for general adult medical examination without abnormal findings: Secondary | ICD-10-CM | POA: Diagnosis not present

## 2023-04-30 DIAGNOSIS — R7303 Prediabetes: Secondary | ICD-10-CM | POA: Diagnosis not present

## 2023-04-30 DIAGNOSIS — E7800 Pure hypercholesterolemia, unspecified: Secondary | ICD-10-CM | POA: Diagnosis not present

## 2023-05-06 ENCOUNTER — Other Ambulatory Visit (HOSPITAL_BASED_OUTPATIENT_CLINIC_OR_DEPARTMENT_OTHER): Payer: Self-pay

## 2023-05-07 ENCOUNTER — Encounter: Payer: Self-pay | Admitting: Hematology and Oncology

## 2023-05-07 ENCOUNTER — Other Ambulatory Visit (HOSPITAL_COMMUNITY): Payer: Self-pay

## 2023-05-07 ENCOUNTER — Other Ambulatory Visit (HOSPITAL_BASED_OUTPATIENT_CLINIC_OR_DEPARTMENT_OTHER): Payer: Self-pay

## 2023-05-07 ENCOUNTER — Other Ambulatory Visit: Payer: Self-pay | Admitting: Bariatrics

## 2023-05-07 DIAGNOSIS — D508 Other iron deficiency anemias: Secondary | ICD-10-CM

## 2023-05-08 ENCOUNTER — Other Ambulatory Visit (HOSPITAL_BASED_OUTPATIENT_CLINIC_OR_DEPARTMENT_OTHER): Payer: Self-pay

## 2023-05-08 MED ORDER — ALBUTEROL SULFATE HFA 108 (90 BASE) MCG/ACT IN AERS
1.0000 | INHALATION_SPRAY | RESPIRATORY_TRACT | 0 refills | Status: DC | PRN
  Filled 2023-05-08: qty 6.7, 50d supply, fill #0

## 2023-05-08 MED ORDER — SILVER SULFADIAZINE 1 % EX CREA
1.0000 | TOPICAL_CREAM | Freq: Every day | CUTANEOUS | 0 refills | Status: DC
  Filled 2023-05-08: qty 454, 30d supply, fill #0

## 2023-05-08 MED ORDER — SOLIFENACIN SUCCINATE 10 MG PO TABS
10.0000 mg | ORAL_TABLET | Freq: Every day | ORAL | 1 refills | Status: DC
  Filled 2023-05-08: qty 90, 90d supply, fill #0
  Filled 2023-10-01: qty 90, 90d supply, fill #1

## 2023-05-08 MED ORDER — LISINOPRIL 10 MG PO TABS
10.0000 mg | ORAL_TABLET | Freq: Every morning | ORAL | 1 refills | Status: DC
  Filled 2023-05-08: qty 90, 90d supply, fill #0

## 2023-05-09 DIAGNOSIS — M5416 Radiculopathy, lumbar region: Secondary | ICD-10-CM | POA: Diagnosis not present

## 2023-06-23 NOTE — Progress Notes (Unsigned)
Cardiology Office Note:    Date:  06/24/2023   ID:  [name], DOB [birth year], MRN [id number]  PCP:  Noberto Retort, MD  Cardiologist:  Thurmon Fair, MD  Electrophysiologist:  None   Referring MD: Noberto Retort, MD   Chief Complaint  Patient presents with   Hyperlipidemia  History of pulmonary embolism, paradoxical embolism stroke, inferior vena cava filter  History of Present Illness:    [name] is a 63 y.o. 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  She is having problems with her lower back and walks with a cane.  Has not been able to exercise as she would like to.  She just had an injection that has helped a little bit, but she is likely to need lumbar spine surgery with Dr. Wynetta Emery.  Trying to put that off until December, when her sister will have retired.  She has not had any falls.  She denies chest pain or shortness of breath with her activity level.  She has not had palpitations, dizziness or syncope. Unchanged, mild lower extremity edema.  She has been unable to lose any more weight, but has previously done an excellent job with weight loss, losing about 30 pounds.  She has not changed her diet and has not gained any weight back, but her cholesterol parameters have worsened in the last year (both her total cholesterol and LDL cholesterol have increased by about 50 points to 255 and 158 respectively, although she still has an excellent HDL at 81).    Does not have known CAD or PAD but does have mild-moderate atherosclerosis in the abdominal aorta by CT She has normal left ventricular systolic function with  "moderate diastolic dysfunction" by echo in May 2018 (on my review I would have said that she has normal diastolic function), normal coronary arteries by cardiac catheterization in 2003, normal nuclear stress test in 2017, normal ABIs in 2012.  Abdominal CT in 2012 did show moderate atheromatous calcification in the infrarenal aorta, without any significant arterial obstruction in the lower extremities and three-vessel runoff bilaterally.  I am not entirely sure, but it seems that the inferior vena cava filter was implanted prophylactically before her gastric bypass surgery since she had a history of pulmonary embolism, paradoxical stroke and upper extremity DVT.  She was on anticoagulation with warfarin until 2009, only on aspirin since then without any recurrent arterial or venous thromboembolic events.   Past Medical History:  Diagnosis Date   Arthritis    osteoarthritis   Asthma    states no asthma attack since 2002   Back pain    Breast cancer (HCC) 06/26/16 bx   left breast   Breast cancer (HCC)    Chronic back pain    Complication of anesthesia    states takes more than normal to put her to sleep   Constipation    Dental bridge present    upper   Dental crowns present    DVT of upper extremity (deep vein thrombosis) (HCC)    Fibromyalgia    Genetic testing 09/19/2016   Ms. Amsden underwent genetic counseling and testing for hereditary cancer syndromes on 08/21/2016. Her results were negative for  mutations in all 46 genes analyzed by [facility]'s 46-gene Common Hereditary Cancers Panel. Genes analyzed include: APC, ATM, AXIN2, BARD1, BMPR1A, BRCA1, BRCA2, BRIP1, CDH1, CDKN2A, CHEK2, CTNNA1, DICER1, EPCAM, GREM1, HOXB13, KIT, MEN1, MLH1, MSH2, MSH3, MSH6, MUTYH, NBN,    GERD (gastroesophageal reflux disease)    History of blood transfusion 06/2005   History of gallstones    History of pneumonia    History of shingles 07/2011   History of shingles    HTN (hypertension)    Itching    Joint  pain    Muscle weakness    Neuropathy    Normal coronary arteries 2003   Osteoarthropathy    Personal history of chemotherapy 2018   Personal history of radiation therapy 2018   PFO (patent foramen ovale)    Pneumonia    HX OF    Presence of inferior vena cava filter    Pulmonary embolism (HCC)    Sleep apnea    NOT SINCE 2009    Status post gastric bypass for obesity    Stomach pain    Stroke (HCC) 12/2002   right-sided weakness   Urinary incontinence     Past Surgical History:  Procedure Laterality Date   ABDOMINAL HYSTERECTOMY  11/1997   complete   ANAL RECTAL MANOMETRY N/A 01/21/2019   Procedure: ANO RECTAL MANOMETRY;  Surgeon: Willis Modena, MD;  Location: [facility];  Service: Endoscopy;  Laterality: N/A;   ANTERIOR CERVICAL DECOMP/DISCECTOMY FUSION  02/05/2005   C5-6   APPENDECTOMY  10/22/2008   laparoscopic   BREAST LUMPECTOMY Left 07/2016   BREAST LUMPECTOMY WITH RADIOACTIVE SEED AND SENTINEL LYMPH NODE BIOPSY Left 07/26/2016   Procedure: BREAST LUMPECTOMY WITH RADIOACTIVE SEED AND SENTINEL LYMPH NODE BIOPSY;  Surgeon: Harriette Bouillon, MD;  Location: [facility];  Service: General;  Laterality: Left;   BUNIONECTOMY  05/1980   both feet   BUNIONECTOMY  08/2011   left foot   CARDIAC CATHETERIZATION  03/04/2002   CARPAL TUNNEL RELEASE  06/21/2009   right   CARPAL TUNNEL RELEASE     left hand   CARPAL TUNNEL RELEASE  10/03/2011   Procedure: CARPAL TUNNEL RELEASE;  Surgeon: Nicki Reaper, MD;  Location: [facility];  Service: Orthopedics;  Laterality: Right;  CARPAL TUNNEL WITH HYPOTHENAR FAT PAD TRANSFER   CERVICAL SPINE SURGERY  01/2005   titanium plate implanted   CHOLECYSTECTOMY  1990   ELBOW SURGERY  08/09/2004   decompression ulnar nerve right elbow   ENTEROLYSIS  10/22/2008   laparoscopic abd. enterolysis   GASTRIC ROUX-EN-Y  2009   HEEL SPUR SURGERY  08/1997   left   HEMORRHOID SURGERY  03/1993   LAPAROSCOPIC LYSIS INTESTINAL ADHESIONS   02/14/2000   NAILBED REPAIR  01/10/2005; 08/2011   exc. matrix bilat. great toe   OTHER SURGICAL HISTORY  12/1986   pt states that she had surgery to unclog her fallopean tubes   PORTACATH PLACEMENT N/A 09/24/2016   Procedure: INSERTION PORT-A-CATH WITH [name];  Surgeon: Harriette Bouillon, MD;  Location: [facility];  Service: General;  Laterality: N/A;   REVERSE SHOULDER ARTHROPLASTY Left 10/21/2020   Procedure: REVERSE SHOULDER ARTHROPLASTY;  Surgeon: Beverely Low, MD;  Location: [facility];  Service: Orthopedics;  Laterality: Left;  interscalene block   SHOULDER SURGERY     bilat. - (left:  06/2005)   TONSILLECTOMY  07/1995   TRIGGER FINGER RELEASE  04/25/2006   decompression A-1 pulley left thumb   TRIGGER FINGER RELEASE Right  11/11/2018   Procedure: RELEASE TRIGGER FINGER/A-1 PULLEY RIGHT THUMB, RIGHT RING FINGER;  Surgeon: Cindee Salt, MD;  Location: [facility];  Service: Orthopedics;  Laterality: Right;   UTERINE FIBROID SURGERY  12/95, 7/96   x2   VENA CAVA FILTER PLACEMENT  2009   during Roux-en-Y surg.    Current Medications: Current Meds  Medication Sig   albuterol (VENTOLIN HFA) 108 (90 Base) MCG/ACT inhaler Inhale 1 puff into the lungs every 4 (four) hours as needed.   Albuterol Sulfate, sensor, (PROAIR DIGIHALER) 108 (90 Base) MCG/ACT AEPB Inhale 2 puffs into the lungs every 6 (six) hours as needed.   aspirin EC 81 MG tablet Take 81 mg by mouth daily.   Calcium Citrate-Vitamin D (CALCIUM CITRATE +D PO) Take 2 tablets by mouth daily. Gummies   Cholecalciferol (VITAMIN D3) 1.25 MG (50000 UT) CAPS Take 1 capsule (50,000 Units total) by mouth once a week.   Cyanocobalamin (B-12) 5000 MCG SUBL Place 5,000 mcg under the tongue daily at 6 (six) AM.   diazepam (VALIUM) 5 MG tablet Take 1 tablet (5 mg total) by mouth 45 minutes before procedure. Repeat 20 minutes before procedure if needed.   diazepam (VALIUM) 5 MG tablet Take 1 tablet (5 mg total) by mouth as directed 30-45 minutes prior to  procedure.   diclofenac (VOLTAREN) 75 MG EC tablet Take 1 tablet (75 mg total) by mouth 2 (two) times daily with a meal.   ferrous gluconate (FERGON) 324 MG tablet Take 1 tablet (324 mg total) by mouth daily with breakfast.   hydrOXYzine (ATARAX) 10 MG tablet TAKE 1 TABLET BY MOUTH ONCE DAILY AT BEDTIME IF NEEDED FOR ITCHING   lisinopril (ZESTRIL) 5 MG tablet Take 1 tablet (5 mg total) by mouth daily.   loratadine (CLARITIN) 10 MG tablet Take 1 tablet (10 mg total) by mouth daily.   methocarbamol (ROBAXIN) 750 MG tablet Take 1 tablet (750 mg total) by mouth 3 (three) times daily.   Multiple Vitamins-Minerals (ALIVE ONCE DAILY WOMENS PO) Take 1 tablet by mouth daily.   naproxen (NAPROSYN) 500 MG tablet Take 1 tablet (500 mg total) by mouth every 12 (twelve) hours as needed. Take with food or milk.   naproxen (NAPROSYN) 500 MG tablet Take 1 tablet (500 mg total) by mouth with food or milk every 12 (twelve) hours as needed.   ondansetron (ZOFRAN) 8 MG tablet Take 1 tablet (8 mg total) by mouth daily as needed.   silver sulfADIAZINE (SSD) 1 % cream Apply 1 Application topically daily to the affected area.   sodium chloride (OCEAN) 0.65 % SOLN nasal spray Place 1 spray into both nostrils at bedtime.   solifenacin (VESICARE) 10 MG tablet Take 1 tablet (10 mg total) by mouth daily.   Trolamine Salicylate (BLUE-EMU HEMP EX) Apply 1 application topically daily.   valACYclovir (VALTREX) 500 MG tablet Take 1 tablet (500 mg total) by mouth 2 (two) times daily as needed.   valACYclovir (VALTREX) 500 MG tablet Take 500 mg by mouth daily.   [DISCONTINUED] lisinopril (ZESTRIL) 10 MG tablet Take 1 tablet (10 mg total) by mouth every morning.     Allergies:   Aspirin, Oxycodone hcl, Propoxyphene n-acetaminophen, Tramadol, Adhesive [tape], and Prednisone   Social History   Socioeconomic History   Marital status: Single    Spouse name: Not on file   Number of children: 0   Years of education: Not on file    Highest education level: Not on file  Occupational History   Occupation: disabled  Tobacco Use   Smoking status: Former   Smokeless tobacco: Never   Tobacco comments:    quit smoking 08/1989  Vaping Use   Vaping status: Never Used  Substance and Sexual Activity   Alcohol use: No   Drug use: No   Sexual activity: Not Currently    Birth control/protection: Surgical  Other Topics Concern   Not on file  Social History Narrative   Not on file   Social Drivers of Health   Financial Resource Strain: Not on file  Food Insecurity: Not on file  Transportation Needs: Not on file  Physical Activity: Not on file  Stress: Not on file  Social Connections: Unknown (03/19/2023)   Received from [employer]   Social Network    Social Network: Not on file     Family History: The patient's family history includes Breast cancer in her mother and sister; Breast cancer (age of onset: 96) in her paternal aunt; Breast cancer (age of onset: 11) in her sister; Cervical cancer (age of onset: 33) in her paternal grandmother; Colon polyps in her father; Diabetes in her father; High blood pressure in her father; Hypertension in her mother and another family member; Ovarian cancer (age of onset: 37) in her maternal grandmother; Prostate cancer in her father.  ROS:   Please see the history of present illness.    All other systems are reviewed and are negative.  EKGs/Labs/Other Studies Reviewed:    The following studies were reviewed today: Reviewed echo from 2017, nuclear stress test 2017, lower extremity arterial Dopplers 2012, multiple CT studies including CT of the abdomen, CT of the chest, CTs of the brain and MRI of the brain in 2004  EKG:  EKG is ordered 11/28/2022 and shows mild sinus bradycardia, otherwise normal.  EKG Interpretation Date/Time:  Monday June 24 2023 09:00:37 EST Ventricular Rate:  57 PR Interval:  160 QRS Duration:  72 QT Interval:  464 QTC Calculation: 451 R  Axis:   7  Text Interpretation: Sinus bradycardia When compared with ECG of 28-Nov-2022 08:36, No significant change was found Confirmed by Nakiea Metzner ([id number]) on 06/24/2023 9:19:34 AM        Recent Labs: 10/30/2022: ALT 18; BUN 19; Creatinine 0.7; Hemoglobin 12.4; Platelets 200; Potassium 4.0; Sodium 134; TSH 0.59  Recent Lipid Panel    Component Value Date/Time   CHOL 216 (A) 10/30/2022 1210   CHOL 218 (H) 11/26/2019 1359   TRIG 66 10/30/2022 1210   HDL 139 (A) 10/30/2022 1210   HDL 82 11/26/2019 1359   CHOLHDL 2.5 11/07/2018 0914   CHOLHDL 2.6 09/30/2015 1838   VLDL 12 09/30/2015 1838   LDLCALC 128 10/30/2022 1210   LDLCALC 123 (H) 11/26/2019 1359   Most recent labs from PCP are reviewed show total cholesterol 255, HDL 81, LDL 158, normal triglycerides  Physical Exam:    VS:  BP 104/68 (BP Location: Right Arm, Patient Position: Sitting)   Pulse (!) 57   Ht 5\' 1"  (1.549 m)   Wt 179 lb 9.6 oz (81.5 kg)   BMI 33.94 kg/m     Wt Readings from Last 3 Encounters:  06/24/23 179 lb 9.6 oz (81.5 kg)  04/04/23 172 lb (78 kg)  03/28/23 174 lb 14.4 oz (79.3 kg)      General: Alert, oriented x3, no distress, mildly obese Head: no evidence of trauma, PERRL, EOMI, no exophtalmos or lid lag, no myxedema, no xanthelasma; normal ears,  nose and oropharynx Neck: normal jugular venous pulsations and no hepatojugular reflux; brisk carotid pulses without delay and no carotid bruits Chest: clear to auscultation, no signs of consolidation by percussion or palpation, normal fremitus, symmetrical and full respiratory excursions Cardiovascular: normal position and quality of the apical impulse, regular rhythm, normal first and second heart sounds, no murmurs, rubs or gallops Abdomen: no tenderness or distention, no masses by palpation, no abnormal pulsatility or arterial bruits, normal bowel sounds, no hepatosplenomegaly Extremities: no clubbing, cyanosis or edema; 2+ radial, ulnar and  brachial pulses bilaterally; 2+ right femoral, posterior tibial and dorsalis pedis pulses; 2+ left femoral, posterior tibial and dorsalis pedis pulses; no subclavian or femoral bruits Neurological: grossly nonfocal Psych: Normal mood and affect    ASSESSMENT:    1. History of stroke   2. History of pulmonary embolus (PE)   3. Presence of IVC filter   4. Aortic atherosclerosis (HCC)   5. Hypercholesterolemia   6. Severe obesity (BMI 35.0-39.9) with comorbidity (HCC)   7. Essential hypertension   8. History of external beam radiation therapy       PLAN:    In order of problems listed above:  History of stroke/suspected paradoxical embolism due to PFO: She has not had any new neurological events in almost 20 years.  She is on chronic aspirin, which I think we can safely stop for a week or so for her to have back surgery.  With such a long period of absence of neurological events, it is hard to make an argument for PFO closure, but any future embolic event, even a TIA should prompt reconsideration of PFO closure with a device.  Note that we would have to traverse her inferior vena cava filter from a femoral approach, but the device could be deployed via subclavian approach as well. Recurrent venous thromboembolic disease: She has not had any episodes of DVT or PE since 2007 while on aspirin as the only prophylaxis.  Would aggressively mobilize after any type of surgery and use compression devices and if necessary temporary anticoagulation.  IVC filter: At this point this is likely completely endothelialized and will be difficult to remove; hopefully also less likely to be a nidus for thrombus formation. Ao atherosclerosis: Mild on previous CT.  Known CAD or PAD.  HLP: In the past LDL was only mildly elevated and she has always had an excellent HDL.  There has been a surprising and marked increase in total and LDL cholesterol levels.  She has been reluctant to take medications in the past.   Will check a coronary calcium score to fine-tune our risk assessment. Obesity: Has been an excellent job with her diet.  Exercise is now limited by her back problems.  Encouraged her to see if there is a swimming program that would work for her. HTN:  She has a excellent blood pressure on lisinopril.  Will decrease the dose to 5 mg daily and may be able to wean her off blood pressure medications altogether. History of left breast cancer status post radiation therapy: Does increase her future risk for developing CAD. Preop CV exam: At low risk for major cardiovascular complication with planned lumbar spine surgery, but would aggressively pursue measures for DVT prophylaxis.    Medication Adjustments/Labs and Tests Ordered: Current medicines are reviewed at length with the patient today.  Concerns regarding medicines are outlined above.  Orders Placed This Encounter  Procedures   CT CARDIAC SCORING (SELF PAY ONLY)   EKG 12-Lead  Meds ordered this encounter  Medications   lisinopril (ZESTRIL) 5 MG tablet    Sig: Take 1 tablet (5 mg total) by mouth daily.    Dispense:  90 tablet    Refill:  3    Discontinue 10 mg Lisinopril    Patient Instructions  Medication Instructions:    Decrease Lisinopril to 5 mg daily  Ne w prescription sent to pharmacy  *If you need a refill on your cardiac medications before your next appointment, please call your pharmacy*   Lab Work: Not needed   Testing/Procedures:   CT coronary calcium score.   Test locations:  [facility]  [facility]  This is $99 out of pocket.   Coronary CalciumScan A coronary calcium scan is an imaging test used to look for deposits of calcium and other fatty materials (plaques) in the inner lining of the blood vessels of the heart (coronary arteries). These deposits of calcium and plaques can partly clog and narrow the coronary  arteries without producing any symptoms or warning signs. This puts a person at risk for a heart attack. This test can detect these deposits before symptoms develop. Tell a health care provider about: Any allergies you have. All medicines you are taking, including vitamins, herbs, eye drops, creams, and over-the-counter medicines. Any problems you or family members have had with anesthetic medicines. Any blood disorders you have. Any surgeries you have had. Any medical conditions you have. Whether you are pregnant or may be pregnant. What are the risks? Generally, this is a safe procedure. However, problems may occur, including: Harm to a pregnant woman and her unborn baby. This test involves the use of radiation. Radiation exposure can be dangerous to a pregnant woman and her unborn baby. If you are pregnant, you generally should not have this procedure done. Slight increase in the risk of cancer. This is because of the radiation involved in the test. What happens before the procedure? No preparation is needed for this procedure. What happens during the procedure? You will undress and remove any jewelry around your neck or chest. You will put on a hospital gown. Sticky electrodes will be placed on your chest. The electrodes will be connected to an electrocardiogram (ECG) machine to record a tracing of the electrical activity of your heart. A CT scanner will take pictures of your heart. During this time, you will be asked to lie still and hold your breath for 2-3 seconds while a picture of your heart is being taken. The procedure may vary among health care providers and hospitals. What happens after the procedure? You can get dressed. You can return to your normal activities. It is up to you to get the results of your test. Ask your health care provider, or the department that is doing the test, when your results will be ready. Summary A coronary calcium scan is an imaging test used to look  for deposits of calcium and other fatty materials (plaques) in the inner lining of the blood vessels of the heart (coronary arteries). Generally, this is a safe procedure. Tell your health care provider if you are pregnant or may be pregnant. No preparation is needed for this procedure. A CT scanner will take pictures of your heart. You can return to your normal activities after the scan is done. This information is not intended to replace advice given to you by your health care provider. Make sure you discuss  any questions you have with your health care provider. Document Released: 11/03/2007 Document Revised: 03/26/2016 Document Reviewed: 03/26/2016 Elsevier Interactive Patient Education  2017 [employer].    Follow-Up: At [facility], you and your health needs are our priority.  As part of our continuing mission to provide you with exceptional heart care, we have created designated Provider Care Teams.  These Care Teams include your primary Cardiologist (physician) and Advanced Practice Providers (APPs -  Physician Assistants and Nurse Practitioners) who all work together to provide you with the care you need, when you need it.  We recommend signing up for the patient portal called "MyChart".  Sign up information is provided on this After Visit Summary.  MyChart is used to connect with patients for Virtual Visits (Telemedicine).  Patients are able to view lab/test results, encounter notes, upcoming appointments, etc.  Non-urgent messages can be sent to your provider as well.   To learn more about what you can do with MyChart, go to [email or web address].    Your next appointment:   12 month(s)  Provider:   Thurmon Fair, MD     Other Instructions          Signed, Thurmon Fair, MD  06/24/2023 9:40 AM    [facility]

## 2023-06-24 ENCOUNTER — Encounter: Payer: Self-pay | Admitting: Cardiovascular Disease

## 2023-06-24 ENCOUNTER — Ambulatory Visit: Payer: Medicare Other | Attending: Cardiovascular Disease | Admitting: Cardiovascular Disease

## 2023-06-24 ENCOUNTER — Other Ambulatory Visit (HOSPITAL_BASED_OUTPATIENT_CLINIC_OR_DEPARTMENT_OTHER): Payer: Self-pay

## 2023-06-24 VITALS — BP 104/68 | HR 57 | Ht 61.0 in | Wt 179.6 lb

## 2023-06-24 DIAGNOSIS — E7800 Pure hypercholesterolemia, unspecified: Secondary | ICD-10-CM

## 2023-06-24 DIAGNOSIS — Z8673 Personal history of transient ischemic attack (TIA), and cerebral infarction without residual deficits: Secondary | ICD-10-CM

## 2023-06-24 DIAGNOSIS — I10 Essential (primary) hypertension: Secondary | ICD-10-CM | POA: Diagnosis not present

## 2023-06-24 DIAGNOSIS — I700 Atherosclerosis of aorta: Secondary | ICD-10-CM | POA: Diagnosis not present

## 2023-06-24 DIAGNOSIS — Z923 Personal history of irradiation: Secondary | ICD-10-CM

## 2023-06-24 DIAGNOSIS — Z86711 Personal history of pulmonary embolism: Secondary | ICD-10-CM | POA: Diagnosis not present

## 2023-06-24 DIAGNOSIS — Z95828 Presence of other vascular implants and grafts: Secondary | ICD-10-CM

## 2023-06-24 MED ORDER — LISINOPRIL 5 MG PO TABS
5.0000 mg | ORAL_TABLET | Freq: Every day | ORAL | 3 refills | Status: AC
  Filled 2023-06-24: qty 90, 90d supply, fill #0
  Filled 2023-10-01: qty 90, 90d supply, fill #1
  Filled 2023-12-31: qty 90, 90d supply, fill #2
  Filled 2024-05-07: qty 90, 90d supply, fill #3

## 2023-06-24 NOTE — Patient Instructions (Signed)
Medication Instructions:    Decrease Lisinopril to 5 mg daily  Ne w prescription sent to pharmacy  *If you need a refill on your cardiac medications before your next appointment, please call your pharmacy*   Lab Work: Not needed   Testing/Procedures:   CT coronary calcium score.   Test locations:  [facility]  [facility]  This is $99 out of pocket.   Coronary CalciumScan A coronary calcium scan is an imaging test used to look for deposits of calcium and other fatty materials (plaques) in the inner lining of the blood vessels of the heart (coronary arteries). These deposits of calcium and plaques can partly clog and narrow the coronary arteries without producing any symptoms or warning signs. This puts a person at risk for a heart attack. This test can detect these deposits before symptoms develop. Tell a health care provider about: Any allergies you have. All medicines you are taking, including vitamins, herbs, eye drops, creams, and over-the-counter medicines. Any problems you or family members have had with anesthetic medicines. Any blood disorders you have. Any surgeries you have had. Any medical conditions you have. Whether you are pregnant or may be pregnant. What are the risks? Generally, this is a safe procedure. However, problems may occur, including: Harm to a pregnant woman and her unborn baby. This test involves the use of radiation. Radiation exposure can be dangerous to a pregnant woman and her unborn baby. If you are pregnant, you generally should not have this procedure done. Slight increase in the risk of cancer. This is because of the radiation involved in the test. What happens before the procedure? No preparation is needed for this procedure. What happens during the procedure? You will undress and remove any jewelry around your neck or chest. You will put on a  hospital gown. Sticky electrodes will be placed on your chest. The electrodes will be connected to an electrocardiogram (ECG) machine to record a tracing of the electrical activity of your heart. A CT scanner will take pictures of your heart. During this time, you will be asked to lie still and hold your breath for 2-3 seconds while a picture of your heart is being taken. The procedure may vary among health care providers and hospitals. What happens after the procedure? You can get dressed. You can return to your normal activities. It is up to you to get the results of your test. Ask your health care provider, or the department that is doing the test, when your results will be ready. Summary A coronary calcium scan is an imaging test used to look for deposits of calcium and other fatty materials (plaques) in the inner lining of the blood vessels of the heart (coronary arteries). Generally, this is a safe procedure. Tell your health care provider if you are pregnant or may be pregnant. No preparation is needed for this procedure. A CT scanner will take pictures of your heart. You can return to your normal activities after the scan is done. This information is not intended to replace advice given to you by your health care provider. Make sure you discuss any questions you have with your health care provider. Document Released: 11/03/2007 Document Revised: 03/26/2016 Document Reviewed: 03/26/2016 Elsevier Interactive Patient Education  2017 [employer].    Follow-Up: At [facility], you and your health needs are our priority.  As part of our continuing mission to provide you with  exceptional heart care, we have created designated Provider Care Teams.  These Care Teams include your primary Cardiologist (physician) and Advanced Practice Providers (APPs -  Physician Assistants and Nurse Practitioners) who all work together to provide you with the care you need, when you need it.  We  recommend signing up for the patient portal called "MyChart".  Sign up information is provided on this After Visit Summary.  MyChart is used to connect with patients for Virtual Visits (Telemedicine).  Patients are able to view lab/test results, encounter notes, upcoming appointments, etc.  Non-urgent messages can be sent to your provider as well.   To learn more about what you can do with MyChart, go to [email or web address].    Your next appointment:   12 month(s)  Provider:   Thurmon Fair, MD     Other Instructions

## 2023-06-27 ENCOUNTER — Ambulatory Visit (HOSPITAL_BASED_OUTPATIENT_CLINIC_OR_DEPARTMENT_OTHER)
Admission: RE | Admit: 2023-06-27 | Discharge: 2023-06-27 | Disposition: A | Discharge status: Home / Self Care | Payer: Self-pay | Source: Ambulatory Visit | Attending: Cardiovascular Disease | Admitting: Cardiovascular Disease

## 2023-06-27 DIAGNOSIS — E7800 Pure hypercholesterolemia, unspecified: Secondary | ICD-10-CM | POA: Insufficient documentation

## 2023-07-02 ENCOUNTER — Other Ambulatory Visit (HOSPITAL_BASED_OUTPATIENT_CLINIC_OR_DEPARTMENT_OTHER): Payer: Self-pay

## 2023-07-02 ENCOUNTER — Encounter: Payer: Self-pay | Admitting: Cardiovascular Disease

## 2023-07-02 DIAGNOSIS — E7800 Pure hypercholesterolemia, unspecified: Secondary | ICD-10-CM

## 2023-07-02 DIAGNOSIS — Z79899 Other long term (current) drug therapy: Secondary | ICD-10-CM

## 2023-07-02 MED ORDER — ROSUVASTATIN CALCIUM 20 MG PO TABS
20.0000 mg | ORAL_TABLET | Freq: Every day | ORAL | 3 refills | Status: AC
  Filled 2023-07-02: qty 90, 90d supply, fill #0
  Filled 2023-10-01: qty 90, 90d supply, fill #1
  Filled 2023-10-28 – 2023-12-31 (×2): qty 90, 90d supply, fill #2
  Filled 2024-05-07: qty 90, 90d supply, fill #3

## 2023-07-02 NOTE — Telephone Encounter (Signed)
Patient identification verified by 2 forms. Marilynn Rail, RN    Called and spoke to patient  Patient states:   -has not received rosuvastatin prescription  Informed patient:   -rosuvastatin 20mg  Rx sent to [facility]   -present to lab in 3months for lipid testing, complete while fasting  Patient verbalized understanding, no questions at this time

## 2023-07-02 NOTE — Telephone Encounter (Signed)
Pt called in about having Rosuvastatin sent in.

## 2023-07-03 ENCOUNTER — Other Ambulatory Visit (HOSPITAL_BASED_OUTPATIENT_CLINIC_OR_DEPARTMENT_OTHER): Payer: Self-pay

## 2023-07-03 DIAGNOSIS — M5416 Radiculopathy, lumbar region: Secondary | ICD-10-CM | POA: Diagnosis not present

## 2023-07-03 DIAGNOSIS — M5412 Radiculopathy, cervical region: Secondary | ICD-10-CM | POA: Diagnosis not present

## 2023-07-03 DIAGNOSIS — M25551 Pain in right hip: Secondary | ICD-10-CM | POA: Diagnosis not present

## 2023-07-03 MED ORDER — DIAZEPAM 5 MG PO TABS
ORAL_TABLET | ORAL | 0 refills | Status: DC
Start: 2023-07-03 — End: 2024-04-08
  Filled 2023-07-03: qty 2, 2d supply, fill #0

## 2023-07-05 ENCOUNTER — Telehealth: Payer: Self-pay | Admitting: Cardiovascular Disease

## 2023-07-05 NOTE — Telephone Encounter (Signed)
Patient identification verified by 2 forms. Marilynn Rail, RN    Called and spoke to patient  Patient states:   -when test was completed, radiologist told her about pulmonary portion of test   -radiologist told her she should have to return for additional lung imaging   -would like to know when this would be completed   -would like CT results sent to PCP  Informed patient:   -Per result note provider reviewed, no indication of lung concerns   -CT cardiac results sent to PCP as well   -message sent to Dr. Royann Shivers  Patient verbalized understanding, no questions at this time

## 2023-07-05 NOTE — Telephone Encounter (Signed)
Patient identification verified by 2 forms. Marilynn Rail, RN    Called and spoke to patient  Relayed provider message below  Patient verbalized understanding, no questions at this time

## 2023-07-05 NOTE — Telephone Encounter (Signed)
Patient called to follow-up on complete results for CT CARDIAC SCORING test.

## 2023-07-05 NOTE — Telephone Encounter (Signed)
Please tell her that we still do not have the official radiology report regarding any lung findings. Due to staffing issues, Radiology over-reads of our cardiac studies have sometimes been delayed by as much as two weeks. I looked over the images and I did not see any significant abnormalities that would require additional testing, but I am not a Radiologist. Once I get the report, I'll make sure to let her and the PCP know.

## 2023-07-14 ENCOUNTER — Emergency Department (HOSPITAL_BASED_OUTPATIENT_CLINIC_OR_DEPARTMENT_OTHER)
Admission: EM | Admit: 2023-07-14 | Discharge: 2023-07-14 | Disposition: A | Discharge status: Home / Self Care | Payer: Medicare Other | Attending: Emergency Medicine | Admitting: Emergency Medicine

## 2023-07-14 ENCOUNTER — Encounter (HOSPITAL_BASED_OUTPATIENT_CLINIC_OR_DEPARTMENT_OTHER): Payer: Self-pay

## 2023-07-14 ENCOUNTER — Emergency Department (HOSPITAL_BASED_OUTPATIENT_CLINIC_OR_DEPARTMENT_OTHER): Payer: Medicare Other

## 2023-07-14 DIAGNOSIS — Z79899 Other long term (current) drug therapy: Secondary | ICD-10-CM | POA: Insufficient documentation

## 2023-07-14 DIAGNOSIS — R112 Nausea with vomiting, unspecified: Secondary | ICD-10-CM | POA: Diagnosis not present

## 2023-07-14 DIAGNOSIS — R1010 Upper abdominal pain, unspecified: Secondary | ICD-10-CM | POA: Insufficient documentation

## 2023-07-14 DIAGNOSIS — R109 Unspecified abdominal pain: Secondary | ICD-10-CM | POA: Diagnosis not present

## 2023-07-14 DIAGNOSIS — Z7982 Long term (current) use of aspirin: Secondary | ICD-10-CM | POA: Diagnosis not present

## 2023-07-14 LAB — COMPREHENSIVE METABOLIC PANEL
ALT: 25 U/L (ref 0–44)
AST: 28 U/L (ref 15–41)
Albumin: 4.3 g/dL (ref 3.5–5.0)
Alkaline Phosphatase: 60 U/L (ref 38–126)
Anion gap: 10 (ref 5–15)
BUN: 18 mg/dL (ref 8–23)
CO2: 26 mmol/L (ref 22–32)
Calcium: 9.2 mg/dL (ref 8.9–10.3)
Chloride: 99 mmol/L (ref 98–111)
Creatinine, Ser: 0.87 mg/dL (ref 0.44–1.00)
GFR, Estimated: >60 mL/min (ref >60)
Glucose, Bld: 115 mg/dL — ABNORMAL HIGH (ref 70–99)
Potassium: 3.7 mmol/L (ref 3.5–5.1)
Sodium: 135 mmol/L (ref 135–145)
Total Bilirubin: 0.8 mg/dL (ref 0.0–1.2)
Total Protein: 7.3 g/dL (ref 6.5–8.1)

## 2023-07-14 LAB — URINALYSIS, MICROSCOPIC (REFLEX)

## 2023-07-14 LAB — URINALYSIS, ROUTINE W REFLEX MICROSCOPIC
Bilirubin Urine: NEGATIVE
Glucose, UA: NEGATIVE mg/dL
Ketones, ur: 15 mg/dL — AB
Leukocytes,Ua: NEGATIVE
Nitrite: NEGATIVE
Protein, ur: NEGATIVE mg/dL
Specific Gravity, Urine: 1.01 (ref 1.005–1.030)
pH: 5.5 (ref 5.0–8.0)

## 2023-07-14 LAB — LIPASE, BLOOD: Lipase: 26 U/L (ref 11–51)

## 2023-07-14 LAB — CBC
HCT: 40.6 % (ref 36.0–46.0)
Hemoglobin: 13.7 g/dL (ref 12.0–15.0)
MCH: 32.5 pg (ref 26.0–34.0)
MCHC: 33.7 g/dL (ref 30.0–36.0)
MCV: 96.4 fL (ref 80.0–100.0)
Platelets: 189 10*3/uL (ref 150–400)
RBC: 4.21 MIL/uL (ref 3.87–5.11)
RDW: 12.7 % (ref 11.5–15.5)
WBC: 10.6 10*3/uL — ABNORMAL HIGH (ref 4.0–10.5)
nRBC: 0 % (ref 0.0–0.2)

## 2023-07-14 MED ORDER — MORPHINE SULFATE (PF) 2 MG/ML IV SOLN
2.0000 mg | Freq: Once | INTRAVENOUS | Status: AC
  Administered 2023-07-14: 2 mg via INTRAVENOUS
  Filled 2023-07-14: qty 1

## 2023-07-14 MED ORDER — PANTOPRAZOLE SODIUM 40 MG PO TBEC: 40.0000 mg | DELAYED_RELEASE_TABLET | Freq: Every day | ORAL | 0 refills | Status: DC

## 2023-07-14 MED ORDER — DICYCLOMINE HCL 20 MG PO TABS: 20.0000 mg | ORAL_TABLET | Freq: Two times a day (BID) | ORAL | 0 refills | Status: DC

## 2023-07-14 MED ORDER — ONDANSETRON 8 MG PO TBDP: 8.0000 mg | ORAL_TABLET | Freq: Three times a day (TID) | ORAL | 0 refills | Status: DC | PRN

## 2023-07-14 MED ORDER — ONDANSETRON HCL 4 MG/2ML IJ SOLN
4.0000 mg | Freq: Once | INTRAMUSCULAR | Status: AC
  Administered 2023-07-14: 4 mg via INTRAVENOUS
  Filled 2023-07-14: qty 2

## 2023-07-14 MED ORDER — LACTATED RINGERS IV BOLUS
1000.0000 mL | Freq: Once | INTRAVENOUS | Status: AC
  Administered 2023-07-14: 1000 mL via INTRAVENOUS

## 2023-07-14 MED ORDER — IOHEXOL 300 MG/ML  SOLN
100.0000 mL | Freq: Once | INTRAMUSCULAR | Status: AC | PRN
Start: 2023-07-14 — End: 2023-07-14
  Administered 2023-07-14: 100 mL via INTRAVENOUS

## 2023-07-14 MED ORDER — PANTOPRAZOLE SODIUM 40 MG IV SOLR
40.0000 mg | Freq: Once | INTRAVENOUS | Status: AC
  Administered 2023-07-14: 40 mg via INTRAVENOUS
  Filled 2023-07-14: qty 10

## 2023-07-14 NOTE — ED Provider Notes (Signed)
 [facility] Provider Note   CSN: [id number] Arrival date & time: 07/14/23  1009     History  Chief Complaint  Patient presents with   Abdominal Pain    [name] is a 63 y.o. female.  Pt c/o upper abdominal pain and vomiting in past two days. Dull pain, non radiating, without specific exacerbating or alleviating factors. Emesis not bloody or bilious. No diarrhea or constipation. No abd distension. Denies hx pud or pancreatitis. No back or flank pain. No chest pain or discomfort. No sob or unusual doe. No cough or uri symptoms. No fever or chills. No dysuria or gu c/o.   The history is provided by the patient and medical records.  Abdominal Pain Associated symptoms: nausea and vomiting   Associated symptoms: no chest pain, no chills, no cough, no diarrhea, no dysuria, no fever, no shortness of breath and no sore throat        Home Medications Prior to Admission medications   Medication Sig Start Date End Date Taking? Authorizing Provider  albuterol (VENTOLIN HFA) 108 (90 Base) MCG/ACT inhaler Inhale 1 puff into the lungs every 4 (four) hours as needed. 05/08/23     Albuterol Sulfate, sensor, (PROAIR DIGIHALER) 108 (90 Base) MCG/ACT AEPB Inhale 2 puffs into the lungs every 6 (six) hours as needed. 08/22/21   [provider]  aspirin EC 81 MG tablet Take 81 mg by mouth daily.    [provider]  Calcium Citrate-Vitamin D (CALCIUM CITRATE +D PO) Take 2 tablets by mouth daily. Gummies    [provider]  Cholecalciferol (VITAMIN D3) 1.25 MG (50000 UT) CAPS Take 1 capsule (50,000 Units total) by mouth once a week. 04/04/23   Corinna Capra A, DO  Cyanocobalamin (B-12) 5000 MCG SUBL Place 5,000 mcg under the tongue daily at 6 (six) AM.    [provider]  diazepam (VALIUM) 5 MG tablet Take 1 tablet (5 mg total) by mouth 45 minutes before procedure. Repeat 20 minutes before procedure if needed.  11/27/22     diazepam (VALIUM) 5 MG tablet Take 1 tablet by mouth 30-45 min prior to each procedure 07/03/23     diclofenac (VOLTAREN) 75 MG EC tablet Take 1 tablet (75 mg total) by mouth 2 (two) times daily with a meal. 03/18/23     ferrous gluconate (FERGON) 324 MG tablet Take 1 tablet (324 mg total) by mouth daily with breakfast. 02/04/23 02/04/24  Corinna Capra A, DO  hydrOXYzine (ATARAX) 10 MG tablet TAKE 1 TABLET BY MOUTH ONCE DAILY AT BEDTIME IF NEEDED FOR ITCHING 12/07/21     lisinopril (ZESTRIL) 5 MG tablet Take 1 tablet (5 mg total) by mouth daily. 06/24/23 09/25/23  Croitoru, Mihai, MD  loratadine (CLARITIN) 10 MG tablet Take 1 tablet (10 mg total) by mouth daily. 01/14/23     methocarbamol (ROBAXIN) 750 MG tablet Take 1 tablet (750 mg total) by mouth 3 (three) times daily. 02/05/23     Multiple Vitamins-Minerals (ALIVE ONCE DAILY WOMENS PO) Take 1 tablet by mouth daily.    [provider]  naproxen (NAPROSYN) 500 MG tablet Take 1 tablet (500 mg total) by mouth every 12 (twelve) hours as needed. Take with food or milk. 01/14/23     naproxen (NAPROSYN) 500 MG tablet Take 1 tablet (500 mg total) by mouth with food or milk every 12 (twelve) hours as needed. 01/18/23     ondansetron (ZOFRAN) 8 MG tablet Take 1  tablet (8 mg total) by mouth daily as needed. 01/14/23     rosuvastatin (CRESTOR) 20 MG tablet Take 1 tablet (20 mg total) by mouth daily. 07/02/23 10/01/23  Croitoru, Mihai, MD  silver sulfADIAZINE (SSD) 1 % cream Apply 1 Application topically daily to the affected area. 05/08/23     sodium chloride (OCEAN) 0.65 % SOLN nasal spray Place 1 spray into both nostrils at bedtime.    [provider]  solifenacin (VESICARE) 10 MG tablet Take 1 tablet (10 mg total) by mouth daily. 05/08/23     Trolamine Salicylate (BLUE-EMU HEMP EX) Apply 1 application topically daily.    [provider]  valACYclovir (VALTREX) 500 MG tablet Take 1 tablet (500 mg total) by mouth 2 (two) times daily as  needed. 02/05/23     valACYclovir (VALTREX) 500 MG tablet Take 500 mg by mouth daily. 03/21/18   [provider]      Allergies    Aspirin, Oxycodone hcl, Propoxyphene n-acetaminophen, Tramadol, Adhesive [tape], and Prednisone    Review of Systems   Review of Systems  Constitutional:  Negative for chills and fever.  HENT:  Negative for sore throat.   Eyes:  Negative for redness.  Respiratory:  Negative for cough and shortness of breath.   Cardiovascular:  Negative for chest pain.  Gastrointestinal:  Positive for abdominal pain, nausea and vomiting. Negative for diarrhea.  Genitourinary:  Negative for dysuria and flank pain.  Musculoskeletal:  Negative for back pain and neck pain.  Skin:  Negative for rash.  Neurological:  Negative for headaches.    Physical Exam Updated Vital Signs BP 119/81   Pulse 71   Temp 97.7 F (36.5 C) (Oral)   Resp 17   Ht 1.549 m (5\' 1" )   Wt 77.1 kg   SpO2 99%   BMI 32.12 kg/m  Physical Exam Vitals and nursing note reviewed.  Constitutional:      Appearance: Normal appearance. She is well-developed.  HENT:     Head: Atraumatic.     Nose: Nose normal.     Mouth/Throat:     Mouth: Mucous membranes are moist.  Eyes:     General: No scleral icterus.    Conjunctiva/sclera: Conjunctivae normal.  Neck:     Trachea: No tracheal deviation.  Cardiovascular:     Rate and Rhythm: Normal rate and regular rhythm.     Pulses: Normal pulses.     Heart sounds: Normal heart sounds. No murmur heard.    No friction rub. No gallop.  Pulmonary:     Effort: Pulmonary effort is normal. No respiratory distress.     Breath sounds: Normal breath sounds.  Abdominal:     General: Bowel sounds are normal. There is no distension.     Palpations: Abdomen is soft. There is no mass.     Tenderness: There is abdominal tenderness. There is no guarding or rebound.     Hernia: No hernia is present.     Comments: Upper abd tenderness.   Genitourinary:     Comments: No cva tenderness.  Musculoskeletal:        General: No swelling or tenderness.     Cervical back: Normal range of motion and neck supple. No rigidity. No muscular tenderness.     Right lower leg: No edema.     Left lower leg: No edema.  Skin:    General: Skin is warm and dry.     Findings: No rash.  Neurological:  Mental Status: She is alert.     Comments: Alert, speech normal.   Psychiatric:        Mood and Affect: Mood normal.     ED Results / Procedures / Treatments   Labs (all labs ordered are listed, but only abnormal results are displayed) Results for orders placed or performed during the hospital encounter of 07/14/23  Urinalysis, Routine w reflex microscopic -Urine, Clean Catch   Collection Time: 07/14/23 10:24 AM  Result Value Ref Range   Color, Urine YELLOW YELLOW   APPearance CLEAR CLEAR   Specific Gravity, Urine 1.010 1.005 - 1.030   pH 5.5 5.0 - 8.0   Glucose, UA NEGATIVE NEGATIVE mg/dL   Hgb urine dipstick TRACE (A) NEGATIVE   Bilirubin Urine NEGATIVE NEGATIVE   Ketones, ur 15 (A) NEGATIVE mg/dL   Protein, ur NEGATIVE NEGATIVE mg/dL   Nitrite NEGATIVE NEGATIVE   Leukocytes,Ua NEGATIVE NEGATIVE  Urinalysis, Microscopic (reflex)   Collection Time: 07/14/23 10:24 AM  Result Value Ref Range   RBC / HPF 6-10 0 - 5 RBC/hpf   WBC, UA 0-5 0 - 5 WBC/hpf   Bacteria, UA FEW (A) NONE SEEN   Squamous Epithelial / HPF 0-5 0 - 5 /HPF   Mucus PRESENT   Lipase, blood   Collection Time: 07/14/23 11:19 AM  Result Value Ref Range   Lipase 26 11 - 51 U/L  Comprehensive metabolic panel   Collection Time: 07/14/23 11:19 AM  Result Value Ref Range   Sodium 135 135 - 145 mmol/L   Potassium 3.7 3.5 - 5.1 mmol/L   Chloride 99 98 - 111 mmol/L   CO2 26 22 - 32 mmol/L   Glucose, Bld 115 (H) 70 - 99 mg/dL   BUN 18 8 - 23 mg/dL   Creatinine, Ser 1.61 0.44 - 1.00 mg/dL   Calcium 9.2 8.9 - 09.6 mg/dL   Total Protein 7.3 6.5 - 8.1 g/dL   Albumin 4.3 3.5 - 5.0 g/dL    AST 28 15 - 41 U/L   ALT 25 0 - 44 U/L   Alkaline Phosphatase 60 38 - 126 U/L   Total Bilirubin 0.8 0.0 - 1.2 mg/dL   GFR, Estimated >04 >54 mL/min   Anion gap 10 5 - 15  CBC   Collection Time: 07/14/23 11:19 AM  Result Value Ref Range   WBC 10.6 (H) 4.0 - 10.5 K/uL   RBC 4.21 3.87 - 5.11 MIL/uL   Hemoglobin 13.7 12.0 - 15.0 g/dL   HCT 09.8 11.9 - 14.7 %   MCV 96.4 80.0 - 100.0 fL   MCH 32.5 26.0 - 34.0 pg   MCHC 33.7 30.0 - 36.0 g/dL   RDW 82.9 56.2 - 13.0 %   Platelets 189 150 - 400 K/uL   nRBC 0.0 0.0 - 0.2 %   CT CARDIAC SCORING (SELF PAY ONLY) Addendum Date: 07/06/2023 ADDENDUM REPORT: 07/06/2023 17:59 EXAM: OVER-READ INTERPRETATION CT CHEST The following report is an over-read performed by radiologist Dr. Romona Curls of [facility] on 07/06/2023. This over-read does not include interpretation of cardiac or coronary anatomy or pathology. The coronary calcium score interpretation by the cardiologist is attached. COMPARISON:  CT chest dated 09/30/2015. FINDINGS: Cardiovascular: Vascular calcifications are seen in the thoracic aorta. The heart is mildly enlarged. No pericardial effusion. Mediastinum/Nodes: No enlarged mediastinal lymph nodes. The visible trachea and esophagus demonstrate no significant findings. Lungs/Pleura: The visible lungs are clear. No pleural effusion. Upper Abdomen: The patient is status  post gastric bypass. Musculoskeletal: Degenerative changes are seen in the spine. IMPRESSION: 1. No significant extracardiac findings. 2. Mild cardiomegaly. Aortic Atherosclerosis (ICD10-I70.0). Electronically Signed   By: Romona Curls M.D.   On: 07/06/2023 17:59   Result Date: 07/06/2023 : CLINICAL DATA:  Cardiovascular Disease Risk stratification EXAM: Coronary Calcium Score TECHNIQUE: A gated, non-contrast computed tomography scan of the heart was performed using 3mm slice thickness. Axial images were analyzed on a dedicated workstation. Calcium scoring of the  coronary arteries was performed using the Agatston method. FINDINGS: Coronary Calcium Score: Left main: 0 Left anterior descending artery: 337 Left circumflex artery: 0 Right coronary artery: 312 Total: 649 Percentile: 98 Pericardium: Normal. Ascending Aorta: Normal caliber.  Mild calcifications are noted. Pulmonary artery: Normal caliber Non-cardiac: See separate report from [facility]. IMPRESSION: Coronary calcium score of 649. This was 76 percentile for age-, race-, and sex-matched controls. RECOMMENDATIONS: Coronary artery calcium (CAC) score is a strong predictor of incident coronary heart disease (CHD) and provides predictive information beyond traditional risk factors. CAC scoring is reasonable to use in the decision to withhold, postpone, or initiate statin therapy in intermediate-risk or selected borderline-risk asymptomatic adults (age 34-75 years and LDL-C >=70 to <190 mg/dL) who do not have diabetes or established atherosclerotic cardiovascular disease (ASCVD).* In intermediate-risk (10-year ASCVD risk >=7.5% to <20%) adults or selected borderline-risk (10-year ASCVD risk >=5% to <7.5%) adults in whom a CAC score is measured for the purpose of making a treatment decision the following recommendations have been made: If CAC=0, it is reasonable to withhold statin therapy and reassess in 5 to 10 years, as long as higher risk conditions are absent (diabetes mellitus, family history of premature CHD in first degree relatives (males <55 years; females <65 years), cigarette smoking, or LDL >=190 mg/dL). If CAC is 1 to 99, it is reasonable to initiate statin therapy for patients >=64 years of age. If CAC is >=100 or >=75th percentile, it is reasonable to initiate statin therapy at any age. Cardiology referral should be considered for patients with CAC scores >=400 or >=75th percentile. *2018 AHA/ACC/AACVPR/AAPA/ABC/ACPM/ADA/AGS/APhA/ASPC/NLA/PCNA Guideline on the Management of Blood Cholesterol: A  Report of the American College of Cardiology/American Heart Association Task Force on Clinical Practice Guidelines. J Am Coll Cardiol. 2019;73(24):3168-3209. Electronically Signed: By: Gypsy Balsam M.D. On: 07/01/2023 15:01     EKG None  Radiology No results found.  Procedures Procedures    Medications Ordered in ED Medications  lactated ringers bolus 1,000 mL (0 mLs Intravenous Stopped 07/14/23 1418)  ondansetron (ZOFRAN) injection 4 mg (4 mg Intravenous Given 07/14/23 1132)  morphine (PF) 2 MG/ML injection 2 mg (2 mg Intravenous Given 07/14/23 1132)  pantoprazole (PROTONIX) injection 40 mg (40 mg Intravenous Given 07/14/23 1131)  morphine (PF) 2 MG/ML injection 2 mg (2 mg Intravenous Given 07/14/23 1327)  iohexol (OMNIPAQUE) 300 MG/ML solution 100 mL (100 mLs Intravenous Contrast Given 07/14/23 1500)    ED Course/ Medical Decision Making/ A&P                                 Medical Decision Making Problems Addressed: Nausea and vomiting in adult: acute illness or injury with systemic symptoms that poses a threat to life or bodily functions Upper abdominal pain: acute illness or injury with systemic symptoms  Amount and/or Complexity of Data Reviewed External Data Reviewed: notes. Labs: ordered. Decision-making details documented in ED Course. Radiology: ordered and independent interpretation  performed. Decision-making details documented in ED Course.  Risk Prescription drug management. Parenteral controlled substances. Decision regarding hospitalization.   Iv ns. Continuous pulse ox and cardiac monitoring. Labs ordered/sent. Imaging ordered.   Differential diagnosis includes gastritis, pud, pancreatitis, etc. Dispo decision including potential need for admission considered - will get labs and imaging and reassess.   Reviewed nursing notes and prior charts for additional history. External reports reviewed.  LR bolus. Zofran iv. Morphine iv.   Cardiac monitor: sinus  rhythm, rate 72.  Labs reviewed/interpreted by me - wbc 10, hgb 13. Ua neg for uti, sm ketones. Chem unremarkable. Lipase normal.   CT reviewed/interpreted by me - pnd.   Pain improved w meds. Await ct results.   1545, ct results pending - signed out to Dr Lynelle Doctor to check Ct results, recheck pt,  and dispo appropriately.          Final Clinical Impression(s) / ED Diagnoses Final diagnoses:  None    Rx / DC Orders ED Discharge Orders     None         Cathren Laine, MD 07/14/23 1550

## 2023-07-14 NOTE — Discharge Instructions (Signed)
 It was our pleasure to provide your ER care today - we hope that you feel better.  Drink plenty of fluids/stay well hydrated. Take zofran as need.   Follow up with primary care doctor in 1-2 days if symptoms fail to improve/resolve.  Return to ER if worse, new symptoms, fevers, new or worsening or severe abdominal pain, persistent vomiting, or other concern.   You were given pain meds in the ER - no driving for the next 6 hours.

## 2023-07-14 NOTE — ED Triage Notes (Signed)
 Pt reports abdominal pain and vomiting since Friday night. States that the pain is getting worse and no medication is helping.

## 2023-07-14 NOTE — ED Provider Notes (Signed)
 Patient was initially seen by Dr. Cathlyn Parsons.  Please see his note.  Plan was to follow-up on the CT scan of the abdomen pelvis.  CT scan does not show any acute abnormality.  Scans suggested possible component of constipation although patient denies having any issues with constipation.  She takes Metamucil daily.  Will have her try to increase that to twice daily for the next 2 days.  Will try adding a course of Bentyl and Protonix.  Outpatient follow-up with PCP to be rechecked   Linwood Dibbles, MD 07/14/23 [phone]

## 2023-08-06 ENCOUNTER — Other Ambulatory Visit: Payer: Self-pay | Admitting: *Deleted

## 2023-08-06 DIAGNOSIS — E7800 Pure hypercholesterolemia, unspecified: Secondary | ICD-10-CM

## 2023-08-06 DIAGNOSIS — Z79899 Other long term (current) drug therapy: Secondary | ICD-10-CM

## 2023-08-14 DIAGNOSIS — M7061 Trochanteric bursitis, right hip: Secondary | ICD-10-CM | POA: Diagnosis not present

## 2023-08-14 DIAGNOSIS — M1712 Unilateral primary osteoarthritis, left knee: Secondary | ICD-10-CM | POA: Diagnosis not present

## 2023-08-26 DIAGNOSIS — M5416 Radiculopathy, lumbar region: Secondary | ICD-10-CM | POA: Diagnosis not present

## 2023-09-02 ENCOUNTER — Other Ambulatory Visit (HOSPITAL_BASED_OUTPATIENT_CLINIC_OR_DEPARTMENT_OTHER): Payer: Self-pay

## 2023-09-02 MED ORDER — PANTOPRAZOLE SODIUM 40 MG PO TBEC
40.0000 mg | DELAYED_RELEASE_TABLET | Freq: Every day | ORAL | 3 refills | Status: AC
  Filled 2023-09-05 (×2): qty 90, 90d supply, fill #0
  Filled 2023-12-08: qty 90, 90d supply, fill #1
  Filled 2024-03-18: qty 90, 90d supply, fill #2
  Filled 2024-06-11: qty 90, 90d supply, fill #3

## 2023-09-05 ENCOUNTER — Other Ambulatory Visit (HOSPITAL_BASED_OUTPATIENT_CLINIC_OR_DEPARTMENT_OTHER): Payer: Self-pay

## 2023-09-09 DIAGNOSIS — M5412 Radiculopathy, cervical region: Secondary | ICD-10-CM | POA: Diagnosis not present

## 2023-09-11 ENCOUNTER — Other Ambulatory Visit (HOSPITAL_BASED_OUTPATIENT_CLINIC_OR_DEPARTMENT_OTHER): Payer: Self-pay

## 2023-09-11 ENCOUNTER — Encounter: Payer: Self-pay | Admitting: Hematology and Oncology

## 2023-09-11 ENCOUNTER — Ambulatory Visit: Payer: Medicare Other | Admitting: Bariatrics

## 2023-09-11 ENCOUNTER — Encounter: Payer: Self-pay | Admitting: Bariatrics

## 2023-09-11 VITALS — BP 115/73 | HR 55 | Temp 97.9°F | Ht 61.0 in | Wt 175.0 lb

## 2023-09-11 DIAGNOSIS — R7303 Prediabetes: Secondary | ICD-10-CM

## 2023-09-11 DIAGNOSIS — Z6833 Body mass index (BMI) 33.0-33.9, adult: Secondary | ICD-10-CM

## 2023-09-11 DIAGNOSIS — E669 Obesity, unspecified: Secondary | ICD-10-CM | POA: Diagnosis not present

## 2023-09-11 DIAGNOSIS — E559 Vitamin D deficiency, unspecified: Secondary | ICD-10-CM

## 2023-09-11 DIAGNOSIS — E66811 Obesity, class 1: Secondary | ICD-10-CM

## 2023-09-11 MED ORDER — VITAMIN D3 1.25 MG (50000 UT) PO CAPS
50000.0000 [IU] | ORAL_CAPSULE | ORAL | 1 refills | Status: DC
  Filled 2023-09-11 – 2023-10-02 (×2): qty 12, 84d supply, fill #0
  Filled 2023-12-31: qty 12, 84d supply, fill #1

## 2023-09-11 NOTE — Progress Notes (Unsigned)
 WEIGHT SUMMARY AND BIOMETRICS  Weight Lost Since Last Visit: 0  Weight Gained Since Last Visit: 3lb   Vitals Temp: 97.9 F (36.6 C) BP: 115/73 Pulse Rate: (!) 55 SpO2: 100 %   Anthropometric Measurements Height: 5\' 1"  (1.549 m) Weight: 175 lb (79.4 kg) BMI (Calculated): 33.08 Weight at Last Visit: 172lb Weight Lost Since Last Visit: 0 Weight Gained Since Last Visit: 3lb Starting Weight: 173lb Total Weight Loss (lbs): 0 lb (0 kg)   Body Composition  Body Fat %: 47.9 % Fat Mass (lbs): 84 lbs Muscle Mass (lbs): 86.8 lbs Visceral Fat Rating : 13   Other Clinical Data Fasting: no Labs: no Today's Visit #: 30 Starting Date: 02/18/18    OBESITY [name] is here to discuss her progress with her obesity treatment plan along with follow-up of her obesity related diagnoses.    Nutrition Plan: keeping a food journal with goal of 1200 calories and 100 grams of protein daily - 95% adherence.  Current exercise: walking  Interim History:  She is up 3 lbs since her last visit.  Eating all of the food on the plan., Protein intake is as prescribed, and Water  intake is adequate.  Hunger is moderately controlled.  Cravings are moderately controlled.  Assessment/Plan:   Vitamin D  Deficiency Vitamin D  is not at goal of 50.  Most recent vitamin D  level was 40.7. She is on  prescription ergocalciferol  50,000 IU weekly. Lab Results  Component Value Date   VD25OH 40.7 03/23/2020   VD25OH 40.5 11/26/2019   VD25OH 40.6 06/09/2019    Plan: Refill prescription vitamin D  50,000 IU weekly.   Prediabetes Last A1c was 5.7  Medication(s): none Lab Results  Component Value Date   HGBA1C 5.7 10/30/2022   HGBA1C 5.8 04/24/2022   HGBA1C 5.7 (H) 11/26/2019   HGBA1C 5.5 06/09/2019   HGBA1C 5.6 12/17/2018   Lab Results  Component Value Date   INSULIN  3.8  11/26/2019   INSULIN  7.0 06/09/2019   INSULIN  4.9 02/18/2018    Plan: Will minimize all refined carbohydrates both sweets and starches.  Will work on the plan and exercise.  Consider both aerobic and resistance training.  Will keep protein, water , and fiber intake high.  Increase Polyunsaturated and Monounsaturated fats to increase satiety and encourage weight loss.  Aim for 7 to 9 hours of sleep nightly.  Will cook more at home.  Will continue to make good choices if she has to eat out.  She will continue to walk  intermittently as her time allows.    Generalized Obesity: Current BMI BMI (Calculated): 33.08    Nupur is currently in the action stage of change. As such, her goal is to continue with weight loss efforts.  She has agreed to the Category 3 plan.  Exercise goals: Older adults should do exercises that maintain or improve balance if they  are at risk of falling.   Behavioral modification strategies: increasing lean protein intake, no meal skipping, meal planning , increase water  intake, better snacking choices, planning for success, increasing vegetables, get rid of junk food in the home, avoiding temptations, and keep healthy foods in the home.  Jaylinn has agreed to follow-up with our clinic in 6 years.     Objective:   VITALS: Per patient if applicable, see vitals. GENERAL: Alert and in no acute distress. CARDIOPULMONARY: No increased WOB. Speaking in clear sentences.  PSYCH: Pleasant and cooperative. Speech normal rate and rhythm. Affect is appropriate. Insight and judgement are appropriate. Attention is focused, linear, and appropriate.  NEURO: Oriented as arrived to appointment on time with no prompting.   Attestation Statements:   This was prepared with the assistance of Engineer, civil (consulting).  Occasional wrong-word or sound-a-like substitutions may have occurred due to the inherent limitations of voice recognition   Kirk Peper, DO

## 2023-09-18 ENCOUNTER — Ambulatory Visit: Payer: Medicare Other | Admitting: Bariatrics

## 2023-09-23 ENCOUNTER — Other Ambulatory Visit (HOSPITAL_BASED_OUTPATIENT_CLINIC_OR_DEPARTMENT_OTHER): Payer: Self-pay

## 2023-10-01 ENCOUNTER — Other Ambulatory Visit: Payer: Self-pay | Admitting: Bariatrics

## 2023-10-01 ENCOUNTER — Other Ambulatory Visit (HOSPITAL_BASED_OUTPATIENT_CLINIC_OR_DEPARTMENT_OTHER): Payer: Self-pay

## 2023-10-01 DIAGNOSIS — D508 Other iron deficiency anemias: Secondary | ICD-10-CM

## 2023-10-02 ENCOUNTER — Other Ambulatory Visit (HOSPITAL_BASED_OUTPATIENT_CLINIC_OR_DEPARTMENT_OTHER): Payer: Self-pay

## 2023-10-02 ENCOUNTER — Encounter: Payer: Self-pay | Admitting: Hematology and Oncology

## 2023-10-02 ENCOUNTER — Other Ambulatory Visit: Payer: Self-pay

## 2023-10-02 DIAGNOSIS — E7800 Pure hypercholesterolemia, unspecified: Secondary | ICD-10-CM | POA: Diagnosis not present

## 2023-10-02 LAB — LAB REPORT - SCANNED: EGFR: 100

## 2023-10-02 MED ORDER — ALBUTEROL SULFATE HFA 108 (90 BASE) MCG/ACT IN AERS
1.0000 | INHALATION_SPRAY | RESPIRATORY_TRACT | 0 refills | Status: AC | PRN
Start: 2023-10-02 — End: ?
  Filled 2023-10-02: qty 6.7, 20d supply, fill #0

## 2023-10-02 MED ORDER — FERROUS GLUCONATE 324 (38 FE) MG PO TABS
324.0000 mg | ORAL_TABLET | Freq: Every day | ORAL | 0 refills | Status: DC
  Filled 2023-10-02: qty 100, 100d supply, fill #0

## 2023-10-02 MED ORDER — SILVER SULFADIAZINE 1 % EX CREA
1.0000 | TOPICAL_CREAM | Freq: Every day | CUTANEOUS | 0 refills | Status: DC
Start: 2023-10-02 — End: 2024-01-02
  Filled 2023-10-02: qty 400, 27d supply, fill #0

## 2023-10-03 ENCOUNTER — Other Ambulatory Visit (HOSPITAL_BASED_OUTPATIENT_CLINIC_OR_DEPARTMENT_OTHER): Payer: Self-pay

## 2023-10-03 MED ORDER — VALACYCLOVIR HCL 500 MG PO TABS
500.0000 mg | ORAL_TABLET | Freq: Two times a day (BID) | ORAL | 0 refills | Status: DC | PRN
  Filled 2023-10-03: qty 180, 90d supply, fill #0

## 2023-10-07 ENCOUNTER — Other Ambulatory Visit: Payer: Self-pay | Admitting: Family Medicine

## 2023-10-07 DIAGNOSIS — Z1231 Encounter for screening mammogram for malignant neoplasm of breast: Secondary | ICD-10-CM

## 2023-10-09 DIAGNOSIS — M25511 Pain in right shoulder: Secondary | ICD-10-CM | POA: Diagnosis not present

## 2023-10-09 DIAGNOSIS — M25512 Pain in left shoulder: Secondary | ICD-10-CM | POA: Diagnosis not present

## 2023-10-29 ENCOUNTER — Other Ambulatory Visit (HOSPITAL_BASED_OUTPATIENT_CLINIC_OR_DEPARTMENT_OTHER): Payer: Self-pay

## 2023-11-04 ENCOUNTER — Other Ambulatory Visit (HOSPITAL_BASED_OUTPATIENT_CLINIC_OR_DEPARTMENT_OTHER): Payer: Self-pay

## 2023-11-06 DIAGNOSIS — E7800 Pure hypercholesterolemia, unspecified: Secondary | ICD-10-CM | POA: Diagnosis not present

## 2023-11-06 DIAGNOSIS — R7303 Prediabetes: Secondary | ICD-10-CM | POA: Diagnosis not present

## 2023-11-06 DIAGNOSIS — N3281 Overactive bladder: Secondary | ICD-10-CM | POA: Diagnosis not present

## 2023-11-06 DIAGNOSIS — I10 Essential (primary) hypertension: Secondary | ICD-10-CM | POA: Diagnosis not present

## 2023-11-12 DIAGNOSIS — H18413 Arcus senilis, bilateral: Secondary | ICD-10-CM | POA: Diagnosis not present

## 2023-11-13 DIAGNOSIS — M1712 Unilateral primary osteoarthritis, left knee: Secondary | ICD-10-CM | POA: Diagnosis not present

## 2023-11-13 DIAGNOSIS — M7061 Trochanteric bursitis, right hip: Secondary | ICD-10-CM | POA: Diagnosis not present

## 2023-11-26 DIAGNOSIS — M19011 Primary osteoarthritis, right shoulder: Secondary | ICD-10-CM | POA: Diagnosis not present

## 2023-12-09 ENCOUNTER — Other Ambulatory Visit (HOSPITAL_BASED_OUTPATIENT_CLINIC_OR_DEPARTMENT_OTHER): Payer: Self-pay

## 2023-12-10 DIAGNOSIS — M5412 Radiculopathy, cervical region: Secondary | ICD-10-CM | POA: Diagnosis not present

## 2023-12-10 DIAGNOSIS — M5416 Radiculopathy, lumbar region: Secondary | ICD-10-CM | POA: Diagnosis not present

## 2023-12-12 ENCOUNTER — Other Ambulatory Visit (HOSPITAL_BASED_OUTPATIENT_CLINIC_OR_DEPARTMENT_OTHER): Payer: Self-pay

## 2023-12-12 ENCOUNTER — Other Ambulatory Visit: Payer: Self-pay

## 2023-12-12 MED ORDER — DIAZEPAM 5 MG PO TABS
5.0000 mg | ORAL_TABLET | ORAL | 0 refills | Status: DC
  Filled 2023-12-12: qty 2, 2d supply, fill #0

## 2023-12-16 DIAGNOSIS — M5412 Radiculopathy, cervical region: Secondary | ICD-10-CM | POA: Diagnosis not present

## 2023-12-18 DIAGNOSIS — M25511 Pain in right shoulder: Secondary | ICD-10-CM | POA: Diagnosis not present

## 2023-12-26 DIAGNOSIS — M19011 Primary osteoarthritis, right shoulder: Secondary | ICD-10-CM | POA: Diagnosis not present

## 2023-12-31 ENCOUNTER — Encounter: Payer: Self-pay | Admitting: Hematology and Oncology

## 2023-12-31 ENCOUNTER — Other Ambulatory Visit: Payer: Self-pay

## 2023-12-31 ENCOUNTER — Other Ambulatory Visit: Payer: Self-pay | Admitting: Bariatrics

## 2023-12-31 ENCOUNTER — Other Ambulatory Visit (HOSPITAL_BASED_OUTPATIENT_CLINIC_OR_DEPARTMENT_OTHER): Payer: Self-pay

## 2023-12-31 DIAGNOSIS — D508 Other iron deficiency anemias: Secondary | ICD-10-CM

## 2023-12-31 MED ORDER — DICLOFENAC SODIUM 75 MG PO TBEC
75.0000 mg | DELAYED_RELEASE_TABLET | Freq: Two times a day (BID) | ORAL | 1 refills | Status: AC
  Filled 2023-12-31: qty 60, 30d supply, fill #0

## 2023-12-31 MED ORDER — FERROUS GLUCONATE 324 (38 FE) MG PO TABS
324.0000 mg | ORAL_TABLET | Freq: Every day | ORAL | 0 refills | Status: DC
  Filled 2023-12-31: qty 100, 100d supply, fill #0

## 2024-01-01 ENCOUNTER — Other Ambulatory Visit: Payer: Self-pay

## 2024-01-01 ENCOUNTER — Other Ambulatory Visit (HOSPITAL_BASED_OUTPATIENT_CLINIC_OR_DEPARTMENT_OTHER): Payer: Self-pay

## 2024-01-02 ENCOUNTER — Other Ambulatory Visit: Payer: Self-pay

## 2024-01-02 ENCOUNTER — Other Ambulatory Visit (HOSPITAL_BASED_OUTPATIENT_CLINIC_OR_DEPARTMENT_OTHER): Payer: Self-pay

## 2024-01-02 DIAGNOSIS — M19011 Primary osteoarthritis, right shoulder: Secondary | ICD-10-CM | POA: Diagnosis not present

## 2024-01-02 MED ORDER — SOLIFENACIN SUCCINATE 10 MG PO TABS
10.0000 mg | ORAL_TABLET | Freq: Every day | ORAL | 1 refills | Status: AC
  Filled 2024-01-02: qty 90, 90d supply, fill #0
  Filled 2024-05-07: qty 90, 90d supply, fill #1

## 2024-01-02 MED ORDER — SILVER SULFADIAZINE 1 % EX CREA
1.0000 | TOPICAL_CREAM | Freq: Every day | CUTANEOUS | 0 refills | Status: DC
  Filled 2024-01-02: qty 400, 27d supply, fill #0

## 2024-01-07 DIAGNOSIS — M25551 Pain in right hip: Secondary | ICD-10-CM | POA: Diagnosis not present

## 2024-01-10 ENCOUNTER — Other Ambulatory Visit (HOSPITAL_BASED_OUTPATIENT_CLINIC_OR_DEPARTMENT_OTHER): Payer: Self-pay

## 2024-01-13 ENCOUNTER — Other Ambulatory Visit (HOSPITAL_BASED_OUTPATIENT_CLINIC_OR_DEPARTMENT_OTHER): Payer: Self-pay

## 2024-01-13 ENCOUNTER — Encounter (HOSPITAL_BASED_OUTPATIENT_CLINIC_OR_DEPARTMENT_OTHER): Payer: Self-pay

## 2024-01-23 DIAGNOSIS — M19011 Primary osteoarthritis, right shoulder: Secondary | ICD-10-CM | POA: Diagnosis not present

## 2024-01-27 ENCOUNTER — Other Ambulatory Visit (HOSPITAL_BASED_OUTPATIENT_CLINIC_OR_DEPARTMENT_OTHER): Payer: Self-pay

## 2024-01-27 ENCOUNTER — Other Ambulatory Visit: Payer: Self-pay

## 2024-01-27 MED ORDER — DIAZEPAM 5 MG PO TABS
ORAL_TABLET | ORAL | 0 refills | Status: DC
  Filled 2024-01-27: qty 1, 1d supply, fill #0

## 2024-01-28 DIAGNOSIS — M5416 Radiculopathy, lumbar region: Secondary | ICD-10-CM | POA: Diagnosis not present

## 2024-02-03 ENCOUNTER — Other Ambulatory Visit (HOSPITAL_BASED_OUTPATIENT_CLINIC_OR_DEPARTMENT_OTHER): Payer: Self-pay

## 2024-02-03 MED ORDER — COMIRNATY 30 MCG/0.3ML IM SUSY
0.3000 mL | PREFILLED_SYRINGE | Freq: Once | INTRAMUSCULAR | 0 refills | Status: AC
  Filled 2024-02-03: qty 0.3, 1d supply, fill #0

## 2024-02-03 MED ORDER — FLUZONE 0.5 ML IM SUSY
0.5000 mL | PREFILLED_SYRINGE | Freq: Once | INTRAMUSCULAR | 0 refills | Status: AC
  Filled 2024-02-03: qty 0.5, 1d supply, fill #0

## 2024-02-12 DIAGNOSIS — M7061 Trochanteric bursitis, right hip: Secondary | ICD-10-CM | POA: Diagnosis not present

## 2024-02-12 DIAGNOSIS — M1712 Unilateral primary osteoarthritis, left knee: Secondary | ICD-10-CM | POA: Diagnosis not present

## 2024-02-13 ENCOUNTER — Telehealth (HOSPITAL_BASED_OUTPATIENT_CLINIC_OR_DEPARTMENT_OTHER): Payer: Self-pay | Admitting: *Deleted

## 2024-02-13 NOTE — Telephone Encounter (Signed)
   Pre-operative Risk Assessment    Patient Name: [name]  DOB: [birth year] MRN: [id number]   Date of last office visit: 06/24/23 DR. CROITORU Date of next office visit: NONE   Request for Surgical Clearance    Procedure:  RIGHT REVERSE TSA-PER FORM (TOTAL SHOULDER ARTHROPLASTY)   Date of Surgery:  Clearance TBD                                Surgeon:  DR. ELSPETH HER Surgeon's Group or Practice Name:  [facility] Phone number:  [phone] MEGAN DAVIS Fax number:  [phone]   Type of Clearance Requested:   - Medical  - Pharmacy:  Hold Aspirin      Type of Anesthesia:  CHOICE   Additional requests/questions:    Bonney Niels Jest   02/13/2024, 4:29 PM

## 2024-02-14 ENCOUNTER — Telehealth: Payer: Self-pay

## 2024-02-14 NOTE — Telephone Encounter (Signed)
 1st attempt : 02/14/24 Called patient, NA, left message to contact our office to schedule telehealth visit.

## 2024-02-14 NOTE — Telephone Encounter (Signed)
 Appointment scheduled for 02/19/2024 @ 10:40. Med req and consent are complete. Call patient at [phone]

## 2024-02-14 NOTE — Telephone Encounter (Signed)
   Name: [name]  DOB: [birth year]  MRN: [id number]  Primary Cardiologist: Jerel Balding, MD  Preoperative team, please contact this patient and set up a phone call appointment for further preoperative risk assessment. Please obtain consent and complete medication review. Thank you for your help.  I confirm that guidance regarding antiplatelet and oral anticoagulation therapy has been completed and, if necessary, noted below.  I also confirmed the patient resides in the state of [place] . As per [facility] [employer] telemedicine laws, the patient must reside in the state in which the provider is licensed.  Lamarr Satterfield, NP 02/14/2024, 9:21 AM [facility]

## 2024-02-14 NOTE — Telephone Encounter (Signed)
  Patient Consent for Virtual Visit         [name] has provided verbal consent on 02/14/2024 for a virtual visit (video or telephone).  Appointment scheduled for 02/19/2024.  Med req and consent are complete. Call patient at [phone]    CONSENT FOR VIRTUAL VISIT FOR:  [name]  By participating in this virtual visit I agree to the following:  I hereby voluntarily request, consent and authorize [facility] and its employed or contracted physicians, physician assistants, nurse practitioners or other licensed health care professionals (the Practitioner), to provide me with telemedicine health care services (the "Services) as deemed necessary by the treating Practitioner. I acknowledge and consent to receive the Services by the Practitioner via telemedicine. I understand that the telemedicine visit will involve communicating with the Practitioner through live audiovisual communication technology and the disclosure of certain medical information by electronic transmission. I acknowledge that I have been given the opportunity to request an in-person assessment or other available alternative prior to the telemedicine visit and am voluntarily participating in the telemedicine visit.  I understand that I have the right to withhold or withdraw my consent to the use of telemedicine in the course of my care at any time, without affecting my right to future care or treatment, and that the Practitioner or I may terminate the telemedicine visit at any time. I understand that I have the right to inspect all information obtained and/or recorded in the course of the telemedicine visit and may receive copies of available information for a reasonable fee.  I understand that some of the potential risks of receiving the Services via telemedicine include:  Delay or interruption in medical evaluation due to technological equipment failure or disruption; Information transmitted  may not be sufficient (e.g. poor resolution of images) to allow for appropriate medical decision making by the Practitioner; and/or  In rare instances, security protocols could fail, causing a breach of personal health information.  Furthermore, I acknowledge that it is my responsibility to provide information about my medical history, conditions and care that is complete and accurate to the best of my ability. I acknowledge that Practitioner's advice, recommendations, and/or decision may be based on factors not within their control, such as incomplete or inaccurate data provided by me or distortions of diagnostic images or specimens that may result from electronic transmissions. I understand that the practice of medicine is not an exact science and that Practitioner makes no warranties or guarantees regarding treatment outcomes. I acknowledge that a copy of this consent can be made available to me via my patient portal [facility] MyChart), or I can request a printed copy by calling the office of [facility].    I understand that my insurance will be billed for this visit.   I have read or had this consent read to me. I understand the contents of this consent, which adequately explains the benefits and risks of the Services being provided via telemedicine.  I have been provided ample opportunity to ask questions regarding this consent and the Services and have had my questions answered to my satisfaction. I give my informed consent for the services to be provided through the use of telemedicine in my medical care

## 2024-02-19 ENCOUNTER — Encounter: Payer: Self-pay | Admitting: Nurse Practitioner

## 2024-02-19 ENCOUNTER — Ambulatory Visit: Attending: Internal Medicine | Admitting: Nurse Practitioner

## 2024-02-19 DIAGNOSIS — Z01810 Encounter for preprocedural cardiovascular examination: Secondary | ICD-10-CM

## 2024-02-19 NOTE — Progress Notes (Signed)
 Virtual Visit via Telephone Note   Because of [name] co-morbid illnesses, she is at least at moderate risk for complications without adequate follow up.  This format is felt to be most appropriate for this patient at this time.  Due to technical limitations with video connection (technology), today's appointment will be conducted as an audio only telehealth visit, and Jarvis Knodel verbally agreed to proceed in this manner.   All issues noted in this document were discussed and addressed.  No physical exam could be performed with this format.  Evaluation Performed:  Preoperative cardiovascular risk assessment _____________   Date:  02/19/2024   Patient ID:  [name], DOB [birth year], MRN [id number] Patient Location:  Home Provider location:   Office  Primary Care Provider:  Arloa Elsie SAUNDERS, MD Primary Cardiologist:  Jerel Balding, MD  Chief Complaint / Patient Profile   63 y.o. 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 Kay on date TBD and presents today for telephonic preoperative cardiovascular risk assessment.  History of Present Illness    [name] is a 63 y.o. female who presents via audio/video conferencing for a telehealth visit today.  Pt was last seen in cardiology clinic on 06/24/23 by Dr. Balding.  At that time Lamyia Cdebaca was doing well.  The patient is now pending procedure as outlined above. Since her last visit, she denies chest pain, shortness of breath, lower extremity edema, fatigue, palpitations, melena, hematuria, hemoptysis, diaphoresis, weakness, presyncope, syncope, orthopnea,  and PND. 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 She is able to complete > 4 METS Activity without concerning cardiac symptoms.   Past Medical History    Past Medical History:  Diagnosis Date   Arthritis    osteoarthritis   Asthma    states no asthma attack since 2002   Back pain    Breast cancer (HCC) 06/26/16 bx   left breast   Breast cancer (HCC)    Chronic back pain    Complication of anesthesia    states takes more than normal to put her to sleep   Constipation    Dental bridge present    upper   Dental crowns present    DVT of upper extremity (deep vein thrombosis) (HCC)    Fibromyalgia    Genetic testing 09/19/2016   Ms. Waldridge underwent genetic counseling and testing for hereditary cancer syndromes on 08/21/2016. Her results were negative for mutations in all 46 genes analyzed by [facility]'s 46-gene Common Hereditary Cancers Panel. Genes analyzed include: APC, ATM, AXIN2, BARD1, BMPR1A, BRCA1, BRCA2, BRIP1, CDH1, CDKN2A, CHEK2, CTNNA1, DICER1, EPCAM, GREM1, HOXB13, KIT, MEN1, MLH1, MSH2, MSH3, MSH6, MUTYH, NBN,    GERD (gastroesophageal reflux disease)    History of blood transfusion 06/2005   History of gallstones    History of pneumonia    History of shingles 07/2011   History of shingles    HTN (hypertension)    Itching    Joint pain    Muscle weakness    Neuropathy    Normal coronary arteries 2003   Osteoarthropathy    Personal history of chemotherapy 2018   Personal history of radiation therapy 2018  PFO (patent foramen ovale)    Pneumonia    HX OF    Presence of inferior vena cava filter    Pulmonary embolism [facility])    Sleep apnea    NOT SINCE 2009    Status post gastric bypass for obesity    Stomach pain    Stroke (HCC) 12/2002   right-sided weakness   Urinary incontinence    Past Surgical History:  Procedure Laterality Date   ABDOMINAL HYSTERECTOMY  11/1997   complete   ANAL RECTAL MANOMETRY N/A 01/21/2019    Procedure: ANO RECTAL MANOMETRY;  Surgeon: Burnette Fallow, MD;  Location: [facility];  Service: Endoscopy;  Laterality: N/A;   ANTERIOR CERVICAL DECOMP/DISCECTOMY FUSION  02/05/2005   C5-6   APPENDECTOMY  10/22/2008   laparoscopic   BREAST LUMPECTOMY Left 07/2016   BREAST LUMPECTOMY WITH RADIOACTIVE SEED AND SENTINEL LYMPH NODE BIOPSY Left 07/26/2016   Procedure: BREAST LUMPECTOMY WITH RADIOACTIVE SEED AND SENTINEL LYMPH NODE BIOPSY;  Surgeon: Debby Shipper, MD;  Location: [facility];  Service: General;  Laterality: Left;   BUNIONECTOMY  05/1980   both feet   BUNIONECTOMY  08/2011   left foot   CARDIAC CATHETERIZATION  03/04/2002   CARPAL TUNNEL RELEASE  06/21/2009   right   CARPAL TUNNEL RELEASE     left hand   CARPAL TUNNEL RELEASE  10/03/2011   Procedure: CARPAL TUNNEL RELEASE;  Surgeon: Arley JONELLE Curia, MD;  Location: [facility];  Service: Orthopedics;  Laterality: Right;  CARPAL TUNNEL WITH HYPOTHENAR FAT PAD TRANSFER   CERVICAL SPINE SURGERY  01/2005   titanium plate implanted   CHOLECYSTECTOMY  1990   ELBOW SURGERY  08/09/2004   decompression ulnar nerve right elbow   ENTEROLYSIS  10/22/2008   laparoscopic abd. enterolysis   GASTRIC ROUX-EN-Y  2009   HEEL SPUR SURGERY  08/1997   left   HEMORRHOID SURGERY  03/1993   LAPAROSCOPIC LYSIS INTESTINAL ADHESIONS  02/14/2000   NAILBED REPAIR  01/10/2005; 08/2011   exc. matrix bilat. great toe   OTHER SURGICAL HISTORY  12/1986   pt states that she had surgery to unclog her fallopean tubes   PORTACATH PLACEMENT N/A 09/24/2016   Procedure: INSERTION PORT-A-CATH WITH US ;  Surgeon: Shipper Debby, MD;  Location: [facility];  Service: General;  Laterality: N/A;   REVERSE SHOULDER ARTHROPLASTY Left 10/21/2020   Procedure: REVERSE SHOULDER ARTHROPLASTY;  Surgeon: Kay Kemps, MD;  Location: [facility];  Service: Orthopedics;  Laterality: Left;  interscalene block   SHOULDER SURGERY     bilat. - (left:  06/2005)   TONSILLECTOMY  07/1995    TRIGGER FINGER RELEASE  04/25/2006   decompression A-1 pulley left thumb   TRIGGER FINGER RELEASE Right 11/11/2018   Procedure: RELEASE TRIGGER FINGER/A-1 PULLEY RIGHT THUMB, RIGHT RING FINGER;  Surgeon: Curia Arley, MD;  Location: [facility];  Service: Orthopedics;  Laterality: Right;   UTERINE FIBROID SURGERY  12/95, 7/96   x2   VENA CAVA FILTER PLACEMENT  2009   during Roux-en-Y surg.    Allergies  Allergies  Allergen Reactions   Aspirin  Other (See Comments)    ESOPHAGITIS   Oxycodone  Hcl Diarrhea and Nausea And Vomiting   Propoxyphene N-Acetaminophen  Diarrhea and Nausea And Vomiting   Tramadol  Other (See Comments)    Cause migraines   Adhesive [Tape] Rash and Other (See Comments)    Pulls skin off - please use paper tape   Prednisone  Rash    Home  Medications    Prior to Admission medications   Medication Sig Start Date End Date Taking? Authorizing Provider  albuterol  (VENTOLIN  HFA) 108 (90 Base) MCG/ACT inhaler Inhale 1 puff into the lungs every 4 (four) hours as needed. 10/02/23     Albuterol  Sulfate, sensor, (PROAIR  DIGIHALER) 108 (90 Base) MCG/ACT AEPB Inhale 2 puffs into the lungs every 6 (six) hours as needed. 08/22/21   [provider]  aspirin  EC 81 MG tablet Take 81 mg by mouth daily.    [provider]  Calcium  Citrate-Vitamin D  (CALCIUM  CITRATE +D PO) Take 2 tablets by mouth daily. Gummies    [provider]  Cholecalciferol  (VITAMIN D3) 1.25 MG (50000 UT) CAPS Take 1 capsule (50,000 Units total) by mouth once a week. 09/11/23   Delores Shields A, DO  Cyanocobalamin  (B-12) 5000 MCG SUBL Place 5,000 mcg under the tongue daily at 6 (six) AM.    [provider]  diazepam  (VALIUM ) 5 MG tablet Take 1 tablet (5 mg total) by mouth 45 minutes before procedure. Repeat 20 minutes before procedure if needed. 11/27/22     diazepam  (VALIUM ) 5 MG tablet Take 1 tablet by mouth 30-45 min prior to each procedure 07/03/23     diazepam   (VALIUM ) 5 MG tablet Take 1 tablet by oral route 30-45 min prior to each procedure. 01/27/24     diclofenac  (VOLTAREN ) 75 MG EC tablet Take 1 tablet (75 mg total) by mouth 2 (two) times daily with a meal. 12/31/23     dicyclomine  (BENTYL ) 20 MG tablet Take 1 tablet (20 mg total) by mouth 2 (two) times daily. 07/14/23   Randol Simmonds, MD  ferrous gluconate  [facility]) 324 MG tablet Take 1 tablet (324 mg total) by mouth daily with breakfast. 12/31/23 12/30/24  Delores Shields A, DO  hydrOXYzine  (ATARAX ) 10 MG tablet TAKE 1 TABLET BY MOUTH ONCE DAILY AT BEDTIME IF NEEDED FOR ITCHING 12/07/21     lisinopril  (ZESTRIL ) 5 MG tablet Take 1 tablet (5 mg total) by mouth daily. 06/24/23 04/13/24  Croitoru, Mihai, MD  loratadine  (CLARITIN ) 10 MG tablet Take 1 tablet (10 mg total) by mouth daily. 01/14/23     methocarbamol  (ROBAXIN ) 750 MG tablet Take 1 tablet (750 mg total) by mouth 3 (three) times daily. 02/05/23     Multiple Vitamins-Minerals (ALIVE ONCE DAILY WOMENS PO) Take 1 tablet by mouth daily.    [provider]  naproxen  (NAPROSYN ) 500 MG tablet Take 1 tablet (500 mg total) by mouth every 12 (twelve) hours as needed. Take with food or milk. 01/14/23     naproxen  (NAPROSYN ) 500 MG tablet Take 1 tablet (500 mg total) by mouth with food or milk every 12 (twelve) hours as needed. 01/18/23     ondansetron  (ZOFRAN ) 8 MG tablet Take 1 tablet (8 mg total) by mouth daily as needed. 01/14/23     ondansetron  (ZOFRAN -ODT) 8 MG disintegrating tablet Take 1 tablet (8 mg total) by mouth every 8 (eight) hours as needed for nausea or vomiting. 07/14/23   Randol Simmonds, MD  pantoprazole  (PROTONIX ) 40 MG tablet Take 1 tablet (40 mg total) by mouth daily. 07/14/23   Randol Simmonds, MD  pantoprazole  (PROTONIX ) 40 MG tablet Take 1 tablet (40 mg total) by mouth daily. 09/02/23     rosuvastatin  (CRESTOR ) 20 MG tablet Take 1 tablet (20 mg total) by mouth daily. 07/02/23 04/13/24  Croitoru, Mihai, MD  silver  sulfADIAZINE  (SSD) 1 % cream Apply 1  Application topically daily to the  affected area as directed. 01/02/24     sodium chloride  (OCEAN) 0.65 % SOLN nasal spray Place 1 spray into both nostrils at bedtime.    [provider]  solifenacin  (VESICARE ) 10 MG tablet Take 1 tablet (10 mg total) by mouth daily. 01/02/24     Trolamine Salicylate (BLUE-EMU HEMP EX) Apply 1 application topically daily.    [provider]  valACYclovir  (VALTREX ) 500 MG tablet Take 500 mg by mouth daily. 03/21/18   [provider]  valACYclovir  (VALTREX ) 500 MG tablet Take 1 tablet (500 mg total) by mouth 2 (two) times daily as needed. 10/03/23       Physical Exam    Vital Signs:  [name] does not have vital signs available for review today.  Given telephonic nature of communication, physical exam is limited. AAOx3. NAD. Normal affect.  Speech and respirations are unlabored.  Accessory Clinical Findings    None  Assessment & Plan    1.  Preoperative Cardiovascular Risk Assessment: According to the Revised Cardiac Risk Index (RCRI), her Perioperative Risk of Major Cardiac Event is (%): 0.9. Her Functional Capacity in METs is: 7.59 according to the [facility] Activity Status Index (DASI). The patient is doing well from a cardiac perspective. Therefore, based on ACC/AHA guidelines, the patient would be at acceptable risk for the planned procedure without further cardiovascular testing.   The patient was advised that if she develops new symptoms prior to surgery to contact our office to arrange for a follow-up visit, and she verbalized understanding.  Per office protocol, she may hold aspirin  for 7 days prior to procedure and should resume as soon as hemodynamically stable postoperatively.   A copy of this note will be routed to requesting surgeon.  Time:   Today, I have spent 10 minutes with the patient with telehealth technology discussing medical history, symptoms, and management plan.     Rosaline EMERSON Bane,  NP-C  02/19/2024, 10:43 AM [address], [address] [place], [place] [address] Office [phone] Fax [phone]

## 2024-02-26 ENCOUNTER — Other Ambulatory Visit (HOSPITAL_BASED_OUTPATIENT_CLINIC_OR_DEPARTMENT_OTHER): Payer: Self-pay

## 2024-02-26 MED ORDER — AMOXICILLIN 500 MG PO CAPS
ORAL_CAPSULE | ORAL | 0 refills | Status: AC
  Filled 2024-02-26: qty 20, 7d supply, fill #0

## 2024-03-09 DIAGNOSIS — K080 Exfoliation of teeth due to systemic causes: Secondary | ICD-10-CM | POA: Diagnosis not present

## 2024-03-12 ENCOUNTER — Other Ambulatory Visit (HOSPITAL_BASED_OUTPATIENT_CLINIC_OR_DEPARTMENT_OTHER): Payer: Self-pay

## 2024-03-12 ENCOUNTER — Ambulatory Visit: Admitting: Bariatrics

## 2024-03-12 ENCOUNTER — Encounter: Payer: Self-pay | Admitting: Bariatrics

## 2024-03-12 ENCOUNTER — Encounter: Payer: Self-pay | Admitting: Hematology and Oncology

## 2024-03-12 VITALS — BP 122/70 | HR 57 | Temp 97.7°F | Ht 61.0 in | Wt 180.0 lb

## 2024-03-12 DIAGNOSIS — D509 Iron deficiency anemia, unspecified: Secondary | ICD-10-CM | POA: Diagnosis not present

## 2024-03-12 DIAGNOSIS — E669 Obesity, unspecified: Secondary | ICD-10-CM | POA: Diagnosis not present

## 2024-03-12 DIAGNOSIS — E559 Vitamin D deficiency, unspecified: Secondary | ICD-10-CM | POA: Diagnosis not present

## 2024-03-12 DIAGNOSIS — D508 Other iron deficiency anemias: Secondary | ICD-10-CM

## 2024-03-12 DIAGNOSIS — Z6834 Body mass index (BMI) 34.0-34.9, adult: Secondary | ICD-10-CM

## 2024-03-12 DIAGNOSIS — E661 Drug-induced obesity: Secondary | ICD-10-CM

## 2024-03-12 MED ORDER — VITAMIN D3 1.25 MG (50000 UT) PO CAPS
50000.0000 [IU] | ORAL_CAPSULE | ORAL | 2 refills | Status: AC
  Filled 2024-03-12: qty 12, 84d supply, fill #0

## 2024-03-12 MED ORDER — FERROUS GLUCONATE 324 (38 FE) MG PO TABS
324.0000 mg | ORAL_TABLET | Freq: Every day | ORAL | 2 refills | Status: AC
Start: 2024-03-12 — End: 2025-03-12
  Filled 2024-03-12: qty 90, 90d supply, fill #0

## 2024-03-12 NOTE — Progress Notes (Signed)
 WEIGHT SUMMARY AND BIOMETRICS  Weight Lost Since Last Visit: 0  Weight Gained Since Last Visit: 5lb   Vitals Temp: 97.7 F (36.5 C) BP: 122/70 Pulse Rate: (!) 57 SpO2: 100 %   Anthropometric Measurements Height: 5' 1 (1.549 m) Weight: 180 lb (81.6 kg) BMI (Calculated): 34.03 Weight at Last Visit: 175lb Weight Lost Since Last Visit: 0 Weight Gained Since Last Visit: 5lb Starting Weight: 173lb Total Weight Loss (lbs): 0 lb (0 kg)   Body Composition  Body Fat %: 48.3 % Fat Mass (lbs): 87 lbs Muscle Mass (lbs): 88.2 lbs Visceral Fat Rating : 14   Other Clinical Data Fasting: no Labs: no Today's Visit #: 31 Starting Date: 02/18/18    OBESITY [name] is here to discuss her progress with her obesity treatment plan along with follow-up of her obesity related diagnoses.    Nutrition Plan: keeping a food journal with goal of 1200 calories and 100 grams of protein daily - 85% adherence.  Current exercise: yard work  Interim History:  She is up 5 lbs since her last visit.  Eating all of the food on the plan., Protein intake is as prescribed, and Water  intake is adequate.   Pharmacotherapy: [name] is not on any anti-obesity medications.  Hunger is moderately controlled.  Cravings are moderately controlled.   Assessment/Plan:   Vitamin D  Deficiency Vitamin D  is not at goal of 50.  Most recent vitamin D  level was 40.7. She is on  prescription ergocalciferol  50,000 IU weekly. Lab Results  Component Value Date   VD25OH 40.7 03/23/2020   VD25OH 40.5 11/26/2019   VD25OH 40.6 06/09/2019    Plan: Refill prescription vitamin D  50,000 IU weekly.   Iron  deficiency anemia:   Her energy is good most days.   Plan:  Will continue the Fergon 324 daily, 100 tablets with 1 refill.    Generalized Obesity: Current BMI BMI (Calculated): 34.03    Pharmacotherapy Plan No anti-obesity.   [name] is currently in the action stage of change. As such, her goal is to continue with weight loss efforts.  She has agreed to keeping a food journal with goal of 1,200 calories and 100 grams of protein daily.  Exercise goals: All adults should avoid inactivity. Some physical activity is better than none, and adults who participate in any amount of physical activity gain some health benefits.  Behavioral modification strategies: increasing lean protein intake, no meal skipping, decrease eating out, meal planning , increase water  intake, better snacking choices, planning for success, increasing vegetables, keep healthy foods in the home, keep a strict food journal, work on smaller portions, and mindful eating.  [name] has agreed to follow-up with our clinic in   No orders of the defined types were placed in this encounter.   Medications Discontinued During This Encounter  Medication Reason   pantoprazole  (PROTONIX ) 40 MG tablet  Patient Preference     No orders of the defined types were placed in this encounter.     Objective:   VITALS: Per patient if applicable, see vitals. GENERAL: Alert and in no acute distress. CARDIOPULMONARY: No increased WOB. Speaking in clear sentences.  PSYCH: Pleasant and cooperative. Speech normal rate and rhythm. Affect is appropriate. Insight and judgement are appropriate. Attention is focused, linear, and appropriate.  NEURO: Oriented as arrived to appointment on time with no prompting.   Attestation Statements:   This was prepared with the assistance of Engineer, civil (consulting).  Occasional wrong-word or sound-a-like substitutions may have occurred due to the inherent limitations of voice recognition   Clayborne Daring, DO

## 2024-03-18 ENCOUNTER — Other Ambulatory Visit (HOSPITAL_BASED_OUTPATIENT_CLINIC_OR_DEPARTMENT_OTHER): Payer: Self-pay

## 2024-03-18 DIAGNOSIS — N3281 Overactive bladder: Secondary | ICD-10-CM | POA: Diagnosis not present

## 2024-03-18 DIAGNOSIS — R350 Frequency of micturition: Secondary | ICD-10-CM | POA: Diagnosis not present

## 2024-03-18 DIAGNOSIS — R300 Dysuria: Secondary | ICD-10-CM | POA: Diagnosis not present

## 2024-03-18 MED ORDER — NITROFURANTOIN MONOHYD MACRO 100 MG PO CAPS
100.0000 mg | ORAL_CAPSULE | Freq: Two times a day (BID) | ORAL | 0 refills | Status: DC
  Filled 2024-03-18: qty 10, 5d supply, fill #0

## 2024-03-24 ENCOUNTER — Other Ambulatory Visit (HOSPITAL_BASED_OUTPATIENT_CLINIC_OR_DEPARTMENT_OTHER): Payer: Self-pay

## 2024-03-24 MED ORDER — DIAZEPAM 5 MG PO TABS
ORAL_TABLET | ORAL | 0 refills | Status: DC
  Filled 2024-03-24: qty 1, 1d supply, fill #0

## 2024-04-03 ENCOUNTER — Other Ambulatory Visit (HOSPITAL_BASED_OUTPATIENT_CLINIC_OR_DEPARTMENT_OTHER): Payer: Self-pay

## 2024-04-06 ENCOUNTER — Ambulatory Visit
Admission: RE | Admit: 2024-04-06 | Discharge: 2024-04-06 | Disposition: A | Source: Ambulatory Visit | Attending: Family Medicine | Admitting: Family Medicine

## 2024-04-06 DIAGNOSIS — Z1231 Encounter for screening mammogram for malignant neoplasm of breast: Secondary | ICD-10-CM

## 2024-04-07 NOTE — H&P (Signed)
 Patient's anticipated LOS is less than 2 midnights, meeting these requirements: - Younger than 32 - Lives within 1 hour of care - Has a competent adult at home to recover with post-op recover - NO history of  - Chronic pain requiring opiods  - Diabetes  - Coronary Artery Disease  - Heart failure  - Heart attack  - Stroke  - DVT/VTE  - Cardiac arrhythmia  - Respiratory Failure/COPD  - Renal failure  - Anemia  - Advanced Liver disease     [name] is an 63 y.o. female.    Chief Complaint: right shoulder pain  HPI: Pt is a 63 y.o. female complaining of right shoulder pain for multiple years. Pain had continually increased since the beginning. X-rays in the clinic show end-stage arthritic changes of the right shoulder. Pt has tried various conservative treatments which have failed to alleviate their symptoms, including injections and therapy. Various options are discussed with the patient. Risks, benefits and expectations were discussed with the patient. Patient understand the risks, benefits and expectations and wishes to proceed with surgery.   PCP:  Arloa Elsie SAUNDERS, MD  D/C Plans: Home  PMH: Past Medical History:  Diagnosis Date   Arthritis    osteoarthritis   Asthma    states no asthma attack since 2002   Back pain    Breast cancer (HCC) 06/26/16 bx   left breast   Breast cancer (HCC)    Chronic back pain    Complication of anesthesia    states takes more than normal to put her to sleep   Constipation    Dental bridge present    upper   Dental crowns present    DVT of upper extremity (deep vein thrombosis) (HCC)    Fibromyalgia    Genetic testing 09/19/2016   Ms. Reiswig underwent genetic counseling and testing for hereditary cancer syndromes on 08/21/2016. Her results were negative for mutations in all 46 genes analyzed by [facility]'s 46-gene Common Hereditary Cancers Panel. Genes analyzed include: APC, ATM, AXIN2, BARD1, BMPR1A, BRCA1, BRCA2, BRIP1,  CDH1, CDKN2A, CHEK2, CTNNA1, DICER1, EPCAM, GREM1, HOXB13, KIT, MEN1, MLH1, MSH2, MSH3, MSH6, MUTYH, NBN,    GERD (gastroesophageal reflux disease)    History of blood transfusion 06/2005   History of gallstones    History of pneumonia    History of shingles 07/2011   History of shingles    HTN (hypertension)    Itching    Joint pain    Muscle weakness    Neuropathy    Normal coronary arteries 2003   Osteoarthropathy    Personal history of chemotherapy 2018   Personal history of radiation therapy 2018   PFO (patent foramen ovale)    Pneumonia    HX OF    Presence of inferior vena cava filter    Pulmonary embolism (HCC)    Sleep apnea    NOT SINCE 2009    Status post gastric bypass for obesity    Stomach pain    Stroke (HCC) 12/2002   right-sided weakness   Urinary incontinence     PSH: Past Surgical History:  Procedure Laterality Date   ABDOMINAL HYSTERECTOMY  11/1997   complete   ANAL RECTAL MANOMETRY N/A 01/21/2019   Procedure: ANO RECTAL MANOMETRY;  Surgeon: Burnette Elsie, MD;  Location: [facility];  Service: Endoscopy;  Laterality: N/A;   ANTERIOR CERVICAL DECOMP/DISCECTOMY FUSION  02/05/2005   C5-6   APPENDECTOMY  10/22/2008   laparoscopic   BREAST LUMPECTOMY  Left 07/2016   BREAST LUMPECTOMY WITH RADIOACTIVE SEED AND SENTINEL LYMPH NODE BIOPSY Left 07/26/2016   Procedure: BREAST LUMPECTOMY WITH RADIOACTIVE SEED AND SENTINEL LYMPH NODE BIOPSY;  Surgeon: Debby Shipper, MD;  Location: [facility];  Service: General;  Laterality: Left;   BUNIONECTOMY  05/1980   both feet   BUNIONECTOMY  08/2011   left foot   CARDIAC CATHETERIZATION  03/04/2002   CARPAL TUNNEL RELEASE  06/21/2009   right   CARPAL TUNNEL RELEASE     left hand   CARPAL TUNNEL RELEASE  10/03/2011   Procedure: CARPAL TUNNEL RELEASE;  Surgeon: Arley JONELLE Curia, MD;  Location: [facility];  Service: Orthopedics;  Laterality: Right;  CARPAL TUNNEL WITH HYPOTHENAR FAT PAD TRANSFER    CERVICAL SPINE SURGERY  01/2005   titanium plate implanted   CHOLECYSTECTOMY  1990   ELBOW SURGERY  08/09/2004   decompression ulnar nerve right elbow   ENTEROLYSIS  10/22/2008   laparoscopic abd. enterolysis   GASTRIC ROUX-EN-Y  2009   HEEL SPUR SURGERY  08/1997   left   HEMORRHOID SURGERY  03/1993   LAPAROSCOPIC LYSIS INTESTINAL ADHESIONS  02/14/2000   NAILBED REPAIR  01/10/2005; 08/2011   exc. matrix bilat. great toe   OTHER SURGICAL HISTORY  12/1986   pt states that she had surgery to unclog her fallopean tubes   PORTACATH PLACEMENT N/A 09/24/2016   Procedure: INSERTION PORT-A-CATH WITH US ;  Surgeon: Shipper Debby, MD;  Location: [facility];  Service: General;  Laterality: N/A;   REVERSE SHOULDER ARTHROPLASTY Left 10/21/2020   Procedure: REVERSE SHOULDER ARTHROPLASTY;  Surgeon: Kay Kemps, MD;  Location: [facility];  Service: Orthopedics;  Laterality: Left;  interscalene block   SHOULDER SURGERY     bilat. - (left:  06/2005)   TONSILLECTOMY  07/1995   TRIGGER FINGER RELEASE  04/25/2006   decompression A-1 pulley left thumb   TRIGGER FINGER RELEASE Right 11/11/2018   Procedure: RELEASE TRIGGER FINGER/A-1 PULLEY RIGHT THUMB, RIGHT RING FINGER;  Surgeon: Curia Arley, MD;  Location: [facility];  Service: Orthopedics;  Laterality: Right;   UTERINE FIBROID SURGERY  12/95, 7/96   x2   VENA CAVA FILTER PLACEMENT  2009   during Roux-en-Y surg.    Social History:  reports that she has quit smoking. She has never used smokeless tobacco. She reports that she does not drink alcohol and does not use drugs. BMI: Estimated body mass index is 34.01 kg/m as calculated from the following:   Height as of 03/12/24: 5' 1 (1.549 m).   Weight as of 03/12/24: 81.6 kg.  Lab Results  Component Value Date   ALBUMIN 4.3 07/14/2023   Diabetes: Patient does not have a diagnosis of diabetes.     Smoking Status:      Allergies:  Allergies  Allergen Reactions   Aspirin  Other (See Comments)     ESOPHAGITIS   Oxycodone  Hcl Diarrhea and Nausea And Vomiting   Propoxyphene N-Acetaminophen  Diarrhea and Nausea And Vomiting   Tramadol  Other (See Comments)    Cause migraines   Adhesive [Tape] Rash and Other (See Comments)    Pulls skin off - please use paper tape   Prednisone  Rash    Medications: No current facility-administered medications for this encounter.   Current Outpatient Medications  Medication Sig Dispense Refill   albuterol  (VENTOLIN  HFA) 108 (90 Base) MCG/ACT inhaler Inhale 1 puff into the lungs every 4 (four) hours as needed. 6.7 g 0  Albuterol  Sulfate, sensor, (PROAIR  DIGIHALER) 108 (90 Base) MCG/ACT AEPB Inhale 2 puffs into the lungs every 6 (six) hours as needed.     aspirin  EC 81 MG tablet Take 81 mg by mouth daily.     Calcium  Citrate-Vitamin D  (CALCIUM  CITRATE +D PO) Take 2 tablets by mouth daily. Gummies     Cholecalciferol  (VITAMIN D3) 1.25 MG (50000 UT) CAPS Take 1 capsule (50,000 Units total) by mouth once a week. 12 capsule 2   Cyanocobalamin  (B-12) 5000 MCG SUBL Place 5,000 mcg under the tongue daily at 6 (six) AM.     diazepam  (VALIUM ) 5 MG tablet Take 1 tablet (5 mg total) by mouth 45 minutes before procedure. Repeat 20 minutes before procedure if needed. 2 tablet 0   diazepam  (VALIUM ) 5 MG tablet Take 1 tablet by mouth 30-45 min prior to each procedure 2 tablet 0   diazepam  (VALIUM ) 5 MG tablet Take 1 tablet (5 mg) by mouth 30-45 minutes prior to each procedure. 1 tablet 0   diclofenac  (VOLTAREN ) 75 MG EC tablet Take 1 tablet (75 mg total) by mouth 2 (two) times daily with a meal. 60 tablet 1   dicyclomine  (BENTYL ) 20 MG tablet Take 1 tablet (20 mg total) by mouth 2 (two) times daily. 20 tablet 0   ferrous gluconate  (FERGON) 324 MG tablet Take 1 tablet (324 mg total) by mouth daily with breakfast. 100 tablet 2   hydrOXYzine  (ATARAX ) 10 MG tablet TAKE 1 TABLET BY MOUTH ONCE DAILY AT BEDTIME IF NEEDED FOR ITCHING 90 tablet 1   lisinopril  (ZESTRIL ) 5 MG tablet  Take 1 tablet (5 mg total) by mouth daily. 90 tablet 3   loratadine  (CLARITIN ) 10 MG tablet Take 1 tablet (10 mg total) by mouth daily. 30 tablet 0   methocarbamol  (ROBAXIN ) 750 MG tablet Take 1 tablet (750 mg total) by mouth 3 (three) times daily. 60 tablet 1   Multiple Vitamins-Minerals (ALIVE ONCE DAILY WOMENS PO) Take 1 tablet by mouth daily.     naproxen  (NAPROSYN ) 500 MG tablet Take 1 tablet (500 mg total) by mouth every 12 (twelve) hours as needed. Take with food or milk. 10 tablet 0   naproxen  (NAPROSYN ) 500 MG tablet Take 1 tablet (500 mg total) by mouth with food or milk every 12 (twelve) hours as needed. 20 tablet 0   nitrofurantoin, macrocrystal-monohydrate, (MACROBID) 100 MG capsule Take 1 capsule (100 mg total) by mouth every 12 (twelve) hours with food. 10 capsule 0   ondansetron  (ZOFRAN ) 8 MG tablet Take 1 tablet (8 mg total) by mouth daily as needed. 10 tablet 0   ondansetron  (ZOFRAN -ODT) 8 MG disintegrating tablet Take 1 tablet (8 mg total) by mouth every 8 (eight) hours as needed for nausea or vomiting. 12 tablet 0   pantoprazole  (PROTONIX ) 40 MG tablet Take 1 tablet (40 mg total) by mouth daily. 90 tablet 3   rosuvastatin  (CRESTOR ) 20 MG tablet Take 1 tablet (20 mg total) by mouth daily. 90 tablet 3   silver  sulfADIAZINE  (SSD) 1 % cream Apply 1 Application topically daily to the affected area as directed. 400 g 0   sodium chloride  (OCEAN) 0.65 % SOLN nasal spray Place 1 spray into both nostrils at bedtime.     solifenacin  (VESICARE ) 10 MG tablet Take 1 tablet (10 mg total) by mouth daily. 90 tablet 1   Trolamine Salicylate (BLUE-EMU HEMP EX) Apply 1 application topically daily.     valACYclovir  (VALTREX ) 500 MG tablet Take 500 mg  by mouth daily.     valACYclovir  (VALTREX ) 500 MG tablet Take 1 tablet (500 mg total) by mouth 2 (two) times daily as needed. 180 tablet 0    No results found for this or any previous visit (from the past 48 hours). No results found.  ROS: Pain  with rom of the right upper extremity  Physical Exam: Alert and oriented 63 y.o. female in no acute distress Cranial nerves 2-12 intact Cervical spine: full rom with no tenderness, nv intact distally Chest: active breath sounds bilaterally, no wheeze rhonchi or rales Heart: regular rate and rhythm, no murmur Abd: non tender non distended with active bowel sounds Hip is stable with rom  Right shoulder painful and weak rom Nv intact distally No rashes or edema   Assessment/Plan Assessment: right shoulder cuff arthropathy  Plan:  Patient will undergo a right reverse total shoudler by Dr. Kay at [facility] R[facility] benefits and expectations were discussed with the patient. Patient understand risks, benefits and expectations and wishes to proceed. Preoperative templating of the joint replacement has been completed, documented, and submitted to the Operating Room personnel in order to optimize intra-operative equipment management.   Arvella Fireman PA-C, MPAS [facility] is now [employer] [address], [address], [place], [place] [address] Phone: [phone] [email or web address] Facebook  [employer]

## 2024-04-08 DIAGNOSIS — M5412 Radiculopathy, cervical region: Secondary | ICD-10-CM | POA: Diagnosis not present

## 2024-04-13 NOTE — Patient Instructions (Signed)
 SURGICAL WAITING ROOM VISITATION Patients having surgery or a procedure may have no more than 2 support people in the waiting area - these visitors may rotate in the visitor waiting room.   Due to an increase in RSV and influenza rates and associated hospitalizations, children ages 29 and under may not visit patients in [facility] hospitals. If the patient needs to stay at the hospital during part of their recovery, the visitor guidelines for inpatient rooms apply.  PRE-OP VISITATION  Pre-op nurse will coordinate an appropriate time for 1 support person to accompany the patient in pre-op.  This support person may not rotate.  This visitor will be contacted when the time is appropriate for the visitor to come back in the pre-op area.  Please refer to the [facility] website for the visitor guidelines for Inpatients (after your surgery is over and you are in a regular room).  You are not required to quarantine at this time prior to your surgery. However, you must do this: Hand Hygiene often Do NOT share personal items Notify your provider if you are in close contact with someone who has COVID or you develop fever 100.4 or greater, new onset of sneezing, cough, sore throat, shortness of breath or body aches.  If you test positive for Covid or have been in contact with anyone that has tested positive in the last 10 days please notify you surgeon.    Your procedure is scheduled on:  04/24/24  [facility] Main [facility]: [place] entrance where the [employer] is available.   Report to admitting at: 10:00 AM  Call this number if you have any questions or problems the morning of surgery [phone]  FOLLOW ANY ADDITIONAL PRE OP INSTRUCTIONS YOU RECEIVED FROM YOUR SURGEON'S OFFICE!!!  Do not eat food after Midnight the night prior to your surgery/procedure.  After Midnight you may have the following liquids until : 9:30 AM DAY OF SURGERY  Clear Liquid Diet Water  Black  Coffee (sugar ok, NO MILK/CREAM OR CREAMERS)  Tea (sugar ok, NO MILK/CREAM OR CREAMERS) regular and decaf                             Plain Jell-O  with no fruit (NO RED)                                           Fruit ices (not with fruit pulp, NO RED)                                     Popsicles (NO RED)                                                                  Juice: NO CITRUS JUICES: only apple, WHITE grape, WHITE cranberry Sports drinks like Gatorade or Powerade (NO RED)   The day of surgery:  Drink ONE (1) Pre-Surgery Clear Ensure at : 9:30 AM the morning of surgery. Drink in one sitting. Do not sip.  This drink was  given to you during your hospital pre-op appointment visit. Nothing else to drink after completing the Pre-Surgery Clear Ensure or G2 : No candy, chewing gum or throat lozenges.    Oral Hygiene is also important to reduce your risk of infection.        Remember - BRUSH YOUR TEETH THE MORNING OF SURGERY WITH YOUR REGULAR TOOTHPASTE  Do NOT smoke after Midnight the night before surgery.  STOP TAKING all Vitamins, Herbs and supplements 1 week before your surgery.   Take ONLY these medicines the morning of surgery with A SIP OF WATER :loratadine ,pantoprazole ,solifenacin .Use inhalers as usual and bring them.   If You have been diagnosed with Sleep Apnea - Bring CPAP mask and tubing day of surgery. We will provide you with a CPAP machine on the day of your surgery.                   You may not have any metal on your body including hair pins, jewelry, and body piercing  Do not wear make-up, lotions, powders, perfumes / cologne, or deodorant  Do not wear nail polish including gel and S&S, artificial / acrylic nails, or any other type of covering on natural nails including finger and toenails. If you have artificial nails, gel coating, etc., that needs to be removed by a nail salon, Please have this removed prior to surgery. Not doing so may mean that your surgery could  be cancelled or delayed if the Surgeon or anesthesia staff feels like they are unable to monitor you safely.   Do not shave 48 hours prior to surgery to avoid nicks in your skin which may contribute to postoperative infections.   Contacts, Hearing Aids, dentures or bridgework may not be worn into surgery. DENTURES WILL BE REMOVED PRIOR TO SURGERY PLEASE DO NOT APPLY Poly grip OR ADHESIVES!!!  You may bring a small overnight bag with you on the day of surgery, only pack items that are not valuable. [facility] IS NOT RESPONSIBLE   FOR VALUABLES THAT ARE LOST OR STOLEN.   Patients discharged on the day of surgery will not be allowed to drive home.  Someone NEEDS to stay with you for the first 24 hours after anesthesia.  Do not bring your home medications to the hospital. The Pharmacy will dispense medications listed on your medication list to you during your admission in the Hospital.  Special Instructions: Bring a copy of your healthcare power of attorney and living will documents the day of surgery, if you wish to have them scanned into your [facility]- EPIC  Please read over the following fact sheets you were given: IF YOU HAVE QUESTIONS ABOUT YOUR PRE-OP INSTRUCTIONS, PLEASE CALL [phone]  PATIENT SIGNATURE_________________________________  NURSE SIGNATURE__________________________________  ________________________________________________________________________[facility]- Preparing for Total Shoulder Arthroplasty    Before surgery, you can play an important role. Because skin is not sterile, your skin needs to be as free of germs as possible. You can reduce the number of germs on your skin by using the following products. Benzoyl Peroxide Gel Reduces the number of germs present on the skin Applied twice a day to shoulder area starting two days before surgery    ==================================================================  Please follow these instructions  carefully:  BENZOYL PEROXIDE 5% GEL  Please do not use if you have an allergy to benzoyl peroxide.   If your skin becomes reddened/irritated stop using the benzoyl peroxide.  Starting two days before surgery, apply as follows: Apply benzoyl peroxide in  the morning and at night. Apply after taking a shower. If you are not taking a shower clean entire shoulder front, back, and side along with the armpit with a clean wet washcloth.  Place a quarter-sized dollop on your shoulder and rub in thoroughly, making sure to cover the front, back, and side of your shoulder, along with the armpit.   2 days before ____ AM   ____ PM              1 day before ____ AM   ____ PM                         Do this twice a day for two days.  (Last application is the night before surgery, AFTER using the CHG soap as described below).  Do NOT apply benzoyl peroxide gel on the day of surgery.  Pre-operative 4 CHG Bath Instructions  DYNA-Hex 4 Chlorhexidine  Gluconate 4% Solution Antiseptic 4 fl. oz   You can play a key role in reducing the risk of infection after surgery. Your skin needs to be as free of germs as possible. You can reduce the number of germs on your skin by washing with CHG (chlorhexidine  gluconate) soap before surgery. CHG is an antiseptic soap that kills germs and continues to kill germs even after washing.   DO NOT use if you have an allergy to chlorhexidine /CHG or antibacterial soaps. If your skin becomes reddened or irritated, stop using the CHG and notify one of our RNs at   Please shower with the CHG soap starting 4 days before surgery using the following schedule:     Please keep in mind the following:  DO NOT shave, including legs and underarms, starting the day of your first shower.   You may shave your face at any point before/day of surgery.  Place clean sheets on your bed the day you start using CHG soap. Use a clean washcloth (not used since being washed) for each shower. DO NOT  sleep with pets once you start using the CHG.  CHG Shower Instructions:  If you choose to wash your hair and private area, wash first with your normal shampoo/soap.  After you use shampoo/soap, rinse your hair and body thoroughly to remove shampoo/soap residue.  Turn the water  OFF and apply about 3 tablespoons (45 ml) of CHG soap to a CLEAN washcloth.  Apply CHG soap ONLY FROM YOUR NECK DOWN TO YOUR TOES (washing for 3-5 minutes)  DO NOT use CHG soap on face, private areas, open wounds, or sores.  Pay special attention to the area where your surgery is being performed.  If you are having back surgery, having someone wash your back for you may be helpful. Wait 2 minutes after CHG soap is applied, then you may rinse off the CHG soap.  Pat dry with a clean towel  Put on clean clothes/pajamas   If you choose to wear lotion, please use ONLY the CHG-compatible lotions on the back of this paper.     Additional instructions for the day of surgery: DO NOT APPLY any lotions, deodorants, cologne, or perfumes.   Put on clean/comfortable clothes.  Brush your teeth.  Ask your nurse before applying any prescription medications to the skin.   CHG Compatible Lotions   Aveeno Moisturizing lotion  Cetaphil Moisturizing Cream  Cetaphil Moisturizing Lotion  Clairol Herbal Essence Moisturizing Lotion, Dry Skin  Clairol Herbal Essence Moisturizing Lotion, Extra Dry Skin  Clairol Herbal Essence Moisturizing Lotion, Normal Skin  Curel Age Defying Therapeutic Moisturizing Lotion with Alpha Hydroxy  Curel Extreme Care Body Lotion  Curel Soothing Hands Moisturizing Hand Lotion  Curel Therapeutic Moisturizing Cream, Fragrance-Free  Curel Therapeutic Moisturizing Lotion, Fragrance-Free  Curel Therapeutic Moisturizing Lotion, Original Formula  Eucerin Daily Replenishing Lotion  Eucerin Dry Skin Therapy Plus Alpha Hydroxy Crme  Eucerin Dry Skin Therapy Plus Alpha Hydroxy Lotion  Eucerin Original Crme   Eucerin Original Lotion  Eucerin Plus Crme Eucerin Plus Lotion  Eucerin TriLipid Replenishing Lotion  Keri Anti-Bacterial Hand Lotion  Keri Deep Conditioning Original Lotion Dry Skin Formula Softly Scented  Keri Deep Conditioning Original Lotion, Fragrance Free Sensitive Skin Formula  Keri Lotion Fast Absorbing Fragrance Free Sensitive Skin Formula  Keri Lotion Fast Absorbing Softly Scented Dry Skin Formula  Keri Original Lotion  Keri Skin Renewal Lotion Keri Silky Smooth Lotion  Keri Silky Smooth Sensitive Skin Lotion  Nivea Body Creamy Conditioning Oil  Nivea Body Extra Enriched Lotion  Nivea Body Original Lotion  Nivea Body Sheer Moisturizing Lotion Nivea Crme  Nivea Skin Firming Lotion  NutraDerm 30 Skin Lotion  NutraDerm Skin Lotion  NutraDerm Therapeutic Skin Cream  NutraDerm Therapeutic Skin Lotion  ProShield Protective Hand Cream  Provon moisturizing lotion  Incentive Spirometer  An incentive spirometer is a tool that can help keep your lungs clear and active. This tool measures how well you are filling your lungs with each breath. Taking long deep breaths may help reverse or decrease the chance of developing breathing (pulmonary) problems (especially infection) following: A long period of time when you are unable to move or be active. BEFORE THE PROCEDURE  If the spirometer includes an indicator to show your best effort, your nurse or respiratory therapist will set it to a desired goal. If possible, sit up straight or lean slightly forward. Try not to slouch. Hold the incentive spirometer in an upright position. INSTRUCTIONS FOR USE  Sit on the edge of your bed if possible, or sit up as far as you can in bed or on a chair. Hold the incentive spirometer in an upright position. Breathe out normally. Place the mouthpiece in your mouth and seal your lips tightly around it. Breathe in slowly and as deeply as possible, raising the piston or the ball toward the top of the  column. Hold your breath for 3-5 seconds or for as long as possible. Allow the piston or ball to fall to the bottom of the column. Remove the mouthpiece from your mouth and breathe out normally. Rest for a few seconds and repeat Steps 1 through 7 at least 10 times every 1-2 hours when you are awake. Take your time and take a few normal breaths between deep breaths. The spirometer may include an indicator to show your best effort. Use the indicator as a goal to work toward during each repetition. After each set of 10 deep breaths, practice coughing to be sure your lungs are clear. If you have an incision (the cut made at the time of surgery), support your incision when coughing by placing a pillow or rolled up towels firmly against it. Once you are able to get out of bed, walk around indoors and cough well. You may stop using the incentive spirometer when instructed by your caregiver.  RISKS AND COMPLICATIONS Take your time so you do not get dizzy or light-headed. If you are in pain, you may need to take or ask for pain medication before doing incentive  spirometry. It is harder to take a deep breath if you are having pain. AFTER USE Rest and breathe slowly and easily. It can be helpful to keep track of a log of your progress. Your caregiver can provide you with a simple table to help with this. If you are using the spirometer at home, follow these instructions: SEEK MEDICAL CARE IF:  You are having difficultly using the spirometer. You have trouble using the spirometer as often as instructed. Your pain medication is not giving enough relief while using the spirometer. You develop fever of 100.5 F (38.1 C) or higher. SEEK IMMEDIATE MEDICAL CARE IF:  You cough up bloody sputum that had not been present before. You develop fever of 102 F (38.9 C) or greater. You develop worsening pain at or near the incision site. MAKE SURE YOU:  Understand these instructions. Will watch your  condition. Will get help right away if you are not doing well or get worse. Document Released: 09/17/2006 Document Revised: 07/30/2011 Document Reviewed: 11/18/2006 [facility] Patient Information 2014 [place], [place].   ________________________________________________________________________

## 2024-04-14 ENCOUNTER — Encounter (HOSPITAL_COMMUNITY)
Admission: RE | Admit: 2024-04-14 | Discharge: 2024-04-14 | Disposition: A | Discharge status: Home / Self Care | Source: Ambulatory Visit | Attending: Orthopedic Surgery | Admitting: Orthopedic Surgery

## 2024-04-14 ENCOUNTER — Other Ambulatory Visit: Payer: Self-pay

## 2024-04-14 ENCOUNTER — Encounter (HOSPITAL_COMMUNITY): Payer: Self-pay

## 2024-04-14 VITALS — BP 137/81 | HR 50 | Temp 98.5°F | Ht 61.0 in | Wt 183.0 lb

## 2024-04-14 DIAGNOSIS — J45909 Unspecified asthma, uncomplicated: Secondary | ICD-10-CM | POA: Insufficient documentation

## 2024-04-14 DIAGNOSIS — Z01812 Encounter for preprocedural laboratory examination: Secondary | ICD-10-CM | POA: Diagnosis not present

## 2024-04-14 DIAGNOSIS — Z01818 Encounter for other preprocedural examination: Secondary | ICD-10-CM

## 2024-04-14 DIAGNOSIS — Z86718 Personal history of other venous thrombosis and embolism: Secondary | ICD-10-CM | POA: Insufficient documentation

## 2024-04-14 DIAGNOSIS — M797 Fibromyalgia: Secondary | ICD-10-CM | POA: Diagnosis not present

## 2024-04-14 DIAGNOSIS — Z86711 Personal history of pulmonary embolism: Secondary | ICD-10-CM | POA: Insufficient documentation

## 2024-04-14 DIAGNOSIS — I10 Essential (primary) hypertension: Secondary | ICD-10-CM | POA: Diagnosis not present

## 2024-04-14 DIAGNOSIS — Z87891 Personal history of nicotine dependence: Secondary | ICD-10-CM | POA: Diagnosis not present

## 2024-04-14 DIAGNOSIS — K219 Gastro-esophageal reflux disease without esophagitis: Secondary | ICD-10-CM | POA: Insufficient documentation

## 2024-04-14 DIAGNOSIS — M19011 Primary osteoarthritis, right shoulder: Secondary | ICD-10-CM | POA: Insufficient documentation

## 2024-04-14 DIAGNOSIS — G4733 Obstructive sleep apnea (adult) (pediatric): Secondary | ICD-10-CM | POA: Insufficient documentation

## 2024-04-14 DIAGNOSIS — Z8673 Personal history of transient ischemic attack (TIA), and cerebral infarction without residual deficits: Secondary | ICD-10-CM | POA: Diagnosis not present

## 2024-04-14 LAB — CBC
HCT: 44 % (ref 36.0–46.0)
Hemoglobin: 13.8 g/dL (ref 12.0–15.0)
MCH: 33.5 pg (ref 26.0–34.0)
MCHC: 31.4 g/dL (ref 30.0–36.0)
MCV: 106.8 fL — ABNORMAL HIGH (ref 80.0–100.0)
Platelets: 161 K/uL (ref 150–400)
RBC: 4.12 MIL/uL (ref 3.87–5.11)
RDW: 13.3 % (ref 11.5–15.5)
WBC: 5.7 K/uL (ref 4.0–10.5)
nRBC: 0 % (ref 0.0–0.2)

## 2024-04-14 LAB — SURGICAL PCR SCREEN
MRSA, PCR: NEGATIVE
Staphylococcus aureus: NEGATIVE

## 2024-04-14 NOTE — Progress Notes (Addendum)
 For Anesthesia: PCP - Arloa Elsie SAUNDERS, MD  Cardiologist - Croitoru, Jerel, MD  Clearance: Rosaline Bane: 02/19/24 Bowel Prep reminder:  Chest x-ray - CT cardio: 07/06/23 EKG - 06/24/23 Stress Test -  ECHO - 10/11/16 Cardiac Cath - 03/04/02 Pacemaker/ICD device last checked: Pacemaker orders received: Device Rep notified:  Spinal Cord Stimulator:N/A  Sleep Study - Yes CPAP - NO  Fasting Blood Sugar - N/A Checks Blood Sugar _____ times a day Date and result of last Hgb A1c-  Last dose of GLP1 agonist- N/A GLP1 instructions: Hold 7 days prior to schedule (Hold 24 hours-daily)   Last dose of SGLT-2 inhibitors- N/A SGLT-2 instructions: Hold 72 hours prior to surgery  Blood Thinner Instructions:N/A Last Dose: Time last taken:  Aspirin  Instructions:On hold Last Dose: Time last taken:  Activity level: Can go up a flight of stairs and activities of daily living without stopping and without chest pain and/or shortness of breath   Able to exercise without chest pain and/or shortness of breath  Anesthesia review: Hx: Stroke,DVT,HTN,PE,OSA(NO CPAP).  Patient denies shortness of breath, fever, cough and chest pain at PAT appointment   Patient verbalized understanding of instructions that were reviewed over the telephone.

## 2024-04-20 ENCOUNTER — Encounter (HOSPITAL_COMMUNITY): Payer: Self-pay

## 2024-04-20 NOTE — Anesthesia Preprocedure Evaluation (Signed)
 Anesthesia Evaluation  Patient identified by MRN, date of birth, ID band Patient awake    Reviewed: Allergy & Precautions, H&P , NPO status , Patient's Chart, lab work & pertinent test results  Airway Mallampati: II  TM Distance: >3 FB Neck ROM: Full    Dental no notable dental hx. (+) Teeth Intact, Dental Advisory Given   Pulmonary asthma , former smoker   Pulmonary exam normal breath sounds clear to auscultation       Cardiovascular hypertension,  Rhythm:Regular Rate:Normal     Neuro/Psych CVA, No Residual Symptoms  negative psych ROS   GI/Hepatic Neg liver ROS,GERD  ,,  Endo/Other  negative endocrine ROS    Renal/GU negative Renal ROS  negative genitourinary   Musculoskeletal  (+) Arthritis , Osteoarthritis,  Fibromyalgia -  Abdominal   Peds  Hematology  (+) Blood dyscrasia, anemia   Anesthesia Other Findings   Reproductive/Obstetrics negative OB ROS                              Anesthesia Physical Anesthesia Plan  ASA: 3  Anesthesia Plan: General   Post-op Pain Management: Regional block* and Ofirmev  IV (intra-op)*   Induction: Intravenous  PONV Risk Score and Plan: 4 or greater and Ondansetron , Dexamethasone  and Midazolam   Airway Management Planned: Oral ETT  Additional Equipment:   Intra-op Plan:   Post-operative Plan: Extubation in OR  Informed Consent: I have reviewed the patients History and Physical, chart, labs and discussed the procedure including the risks, benefits and alternatives for the proposed anesthesia with the patient or authorized representative who has indicated his/her understanding and acceptance.     Dental advisory given  Plan Discussed with: CRNA  Anesthesia Plan Comments: (See PAT note from 11/25 )         Anesthesia Quick Evaluation

## 2024-04-20 NOTE — Progress Notes (Signed)
 Case: [id number] Date/Time: 04/24/24 1345   Procedure: ARTHROPLASTY, SHOULDER, TOTAL, REVERSE (Right: Shoulder)   Anesthesia type: Choice   Diagnosis: Arthritis of right shoulder region [M19.011]   Pre-op diagnosis: Primary osteoarthritis of right shoulder   Location: [facility] ROOM 06 / [facility]   Surgeons: Kay Kemps, MD       DISCUSSION: [name] is a 63 yo female with PMH of former smoking, HTN, history of DVT/PE with IVC filter placement, history of PFO, asthma, OSA (no CPAP use), history of CVA (2004), GERD, status post gastric bypass, breast cancer status post lumpectomy and radiation (2018), arthritis, fibromyalgia  Patient reports as a complication of anesthesia: Takes more than normal to put her to sleep  Patient follows with cardiology due to history of stroke due to PFO, DVT and PE with IVC filter in place.  Last seen by Dr. Francyne on 06/24/2023 for preop exam prior to back surgery.  She was cleared as low risk at that time but never ended up having the back surgery.  A CT calcium  score was ordered and showed significant elevation.  It was recommended she was started on a statin.  She had a cardiac clearance televisit on 02/19/2024 and was cleared for surgery:  Preoperative Cardiovascular Risk Assessment: According to the Revised Cardiac Risk Index (RCRI), her Perioperative Risk of Major Cardiac Event is (%): 0.9. Her Functional Capacity in METs is: 7.59 according to the Duke Activity Status Index (DASI). The patient is doing well from a cardiac perspective. Therefore, based on ACC/AHA guidelines, the patient would be at acceptable risk for the planned procedure without further cardiovascular testing.    VS: BP 137/81   Pulse (!) 50   Temp 36.9 C (Oral)   Ht 5' 1 (1.549 m)   Wt 83 kg   SpO2 100%   BMI 34.58 kg/m   PROVIDERS: Arloa Elsie SAUNDERS, MD   LABS: Labs reviewed: Acceptable for surgery. (all labs ordered are listed, but only abnormal results are  displayed)  Labs Reviewed  CBC - Abnormal; Notable for the following components:      Result Value   MCV 106.8 (*)    All other components within normal limits  SURGICAL PCR SCREEN      CT calcium  score 06/27/2023:  FINDINGS:   Coronary Calcium  Score:   Left main: 0   Left anterior descending artery: 337   Left circumflex artery: 0   Right coronary artery: 312   Total: 649   Percentile: 98   Pericardium: Normal.   Ascending Aorta: Normal caliber.  Mild calcifications are noted.   Pulmonary artery: Normal caliber   Non-cardiac: See separate report from [facility].   IMPRESSION:   Coronary calcium  score of 649. This was 66 percentile for age-, race-,   and sex-matched controls.  Echo 10/11/2016:  Study Conclusions  - Left ventricle: The cavity size was normal. Wall thickness was   normal. Systolic function was normal. The estimated ejection   fraction was in the range of 60% to 65%. Wall motion was normal;   there were no regional wall motion abnormalities. Features are   consistent with a pseudonormal left ventricular filling pattern,   with concomitant abnormal relaxation and increased filling   pressure (grade 2 diastolic dysfunction). - Left atrium: The atrium was mildly dilated. - Right atrium: The atrium was mildly dilated.  Past Medical History:  Diagnosis Date   Anemia    Arthritis    osteoarthritis   Asthma  states no asthma attack since 2002   Breast cancer (HCC) 06/26/16 bx   left breast   Chronic back pain    Complication of anesthesia    states takes more than normal to put her to sleep   Constipation    Dental bridge present    upper   Dental crowns present    DVT of upper extremity (deep vein thrombosis) (HCC)    Fibromyalgia    Genetic testing 09/19/2016   Ms. Laramee underwent genetic counseling and testing for hereditary cancer syndromes on 08/21/2016. Her results were negative for mutations in all 46 genes analyzed by  [employer]'s 46-gene Common Hereditary Cancers Panel. Genes analyzed include: APC, ATM, AXIN2, BARD1, BMPR1A, BRCA1, BRCA2, BRIP1, CDH1, CDKN2A, CHEK2, CTNNA1, DICER1, EPCAM, GREM1, HOXB13, KIT, MEN1, MLH1, MSH2, MSH3, MSH6, MUTYH, NBN,    GERD (gastroesophageal reflux disease)    History of blood transfusion 06/2005   History of gallstones    History of pneumonia    History of shingles 07/2011   HTN (hypertension)    Itching    Joint pain    Muscle weakness    Neuropathy    Normal coronary arteries 2003   Osteoarthropathy    Personal history of chemotherapy 2018   Personal history of radiation therapy 2018   PFO (patent foramen ovale)    Pneumonia    HX OF    Presence of inferior vena cava filter    Pulmonary embolism (HCC)    Sleep apnea    NOT SINCE 2009    Status post gastric bypass for obesity    Stomach pain    Stroke (HCC) 12/2002   right-sided weakness   Urinary incontinence     Past Surgical History:  Procedure Laterality Date   ABDOMINAL HYSTERECTOMY  11/1997   complete   ANAL RECTAL MANOMETRY N/A 01/21/2019   Procedure: ANO RECTAL MANOMETRY;  Surgeon: Burnette Fallow, MD;  [facility]: [facility];  Service: Endoscopy;  Laterality: N/A;   ANTERIOR CERVICAL DECOMP/DISCECTOMY FUSION  02/05/2005   C5-6   APPENDECTOMY  10/22/2008   laparoscopic   BREAST LUMPECTOMY Left 07/2016   BREAST LUMPECTOMY WITH RADIOACTIVE SEED AND SENTINEL LYMPH NODE BIOPSY Left 07/26/2016   Procedure: BREAST LUMPECTOMY WITH RADIOACTIVE SEED AND SENTINEL LYMPH NODE BIOPSY;  Surgeon: Debby Shipper, MD;  Location: [facility];  Service: General;  Laterality: Left;   BUNIONECTOMY  05/1980   both feet   BUNIONECTOMY  08/2011   left foot   CARDIAC CATHETERIZATION  03/04/2002   CARPAL TUNNEL RELEASE  06/21/2009   right   CARPAL TUNNEL RELEASE     left hand   CARPAL TUNNEL RELEASE  10/03/2011   Procedure: CARPAL TUNNEL RELEASE;  Surgeon: Arley JONELLE Curia, MD;  Location: [facility];   Service: Orthopedics;  Laterality: Right;  CARPAL TUNNEL WITH HYPOTHENAR FAT PAD TRANSFER   CERVICAL SPINE SURGERY  01/2005   titanium plate implanted   CHOLECYSTECTOMY  1990   ELBOW SURGERY  08/09/2004   decompression ulnar nerve right elbow   ENTEROLYSIS  10/22/2008   laparoscopic abd. enterolysis   GASTRIC ROUX-EN-Y  2009   HEEL SPUR SURGERY  08/1997   left   HEMORRHOID SURGERY  03/1993   LAPAROSCOPIC LYSIS INTESTINAL ADHESIONS  02/14/2000   NAILBED REPAIR  01/10/2005; 08/2011   exc. matrix bilat. great toe   OTHER SURGICAL HISTORY  12/1986   pt states that she had surgery to unclog her fallopean tubes   PORTACATH  PLACEMENT N/A 09/24/2016   Procedure: INSERTION PORT-A-CATH WITH US ;  Surgeon: Vanderbilt Ned, MD;  Location: [facility];  Service: General;  Laterality: N/A;   REVERSE SHOULDER ARTHROPLASTY Left 10/21/2020   Procedure: REVERSE SHOULDER ARTHROPLASTY;  Surgeon: Kay Kemps, MD;  Location[facility];  Service: Orthopedics;  Laterality: Left;  interscalene block   SHOULDER SURGERY     bilat. - (left:  06/2005)   TONSILLECTOMY  07/1995   TRIGGER FINGER RELEASE  04/25/2006   decompression A-1 pulley left thumb   TRIGGER FINGER RELEASE Right 11/11/2018   Procedure: RELEASE TRIGGER FINGER/A-1 PULLEY RIGHT THUMB, RIGHT RING FINGER;  Surgeon: Murrell Kuba, MD;  Location: [facility];  Service: Orthopedics;  Laterality: Right;   UTERINE FIBROID SURGERY  12/95, 7/96   x2   VENA CAVA FILTER PLACEMENT  2009   during Roux-en-Y surg.    MEDICATIONS:  acetaminophen  (TYLENOL ) 500 MG tablet   albuterol  (VENTOLIN  HFA) 108 (90 Base) MCG/ACT inhaler   Albuterol  Sulfate, sensor, (PROAIR  DIGIHALER) 108 (90 Base) MCG/ACT AEPB   aspirin  EC 81 MG tablet   CALCIUM  CITRATE-VITAMIN D  PO   Cholecalciferol  (VITAMIN D3) 1.25 MG (50000 UT) CAPS   Coenzyme Q10 (CO Q 10 PO)   Cyanocobalamin  (B-12) 5000 MCG SUBL   diazepam  (VALIUM ) 5 MG tablet   diclofenac  (VOLTAREN ) 75 MG EC tablet   ferrous gluconate   (FERGON) 324 MG tablet   lisinopril  (ZESTRIL ) 5 MG tablet   loratadine  (CLARITIN ) 10 MG tablet   methocarbamol  (ROBAXIN ) 750 MG tablet   Multiple Vitamins-Minerals (ALIVE ONCE DAILY WOMENS PO)   ondansetron  (ZOFRAN ) 8 MG tablet   ondansetron  (ZOFRAN -ODT) 8 MG disintegrating tablet   pantoprazole  (PROTONIX ) 40 MG tablet   Psyllium (METAMUCIL PO)   rosuvastatin  (CRESTOR ) 20 MG tablet   silver  sulfADIAZINE  (SSD) 1 % cream   sodium chloride  (OCEAN) 0.65 % SOLN nasal spray   solifenacin  (VESICARE ) 10 MG tablet   Trolamine Salicylate (BLUE-EMU HEMP EX)   valACYclovir  (VALTREX ) 500 MG tablet   No current facility-administered medications for this encounter.    Burnard CHRISTELLA Odis DEVONNA [facility] Short Stay/Anesthesiology [facility] Phone [phone] 04/20/2024 9:45 AM

## 2024-04-22 DIAGNOSIS — M7061 Trochanteric bursitis, right hip: Secondary | ICD-10-CM | POA: Diagnosis not present

## 2024-04-22 DIAGNOSIS — M1712 Unilateral primary osteoarthritis, left knee: Secondary | ICD-10-CM | POA: Diagnosis not present

## 2024-04-24 ENCOUNTER — Ambulatory Visit (HOSPITAL_COMMUNITY): Payer: Self-pay | Admitting: Medical

## 2024-04-24 ENCOUNTER — Ambulatory Visit (HOSPITAL_COMMUNITY): Payer: Self-pay

## 2024-04-24 ENCOUNTER — Encounter (HOSPITAL_COMMUNITY): Admission: RE | Disposition: A | Payer: Self-pay | Source: Ambulatory Visit | Attending: Orthopedic Surgery

## 2024-04-24 ENCOUNTER — Other Ambulatory Visit: Payer: Self-pay

## 2024-04-24 ENCOUNTER — Observation Stay (HOSPITAL_COMMUNITY)

## 2024-04-24 ENCOUNTER — Encounter: Payer: Self-pay | Admitting: Hematology and Oncology

## 2024-04-24 ENCOUNTER — Encounter (HOSPITAL_COMMUNITY): Payer: Self-pay | Admitting: Orthopedic Surgery

## 2024-04-24 ENCOUNTER — Observation Stay (HOSPITAL_COMMUNITY)
Admission: RE | Admit: 2024-04-24 | Discharge: 2024-04-25 | Disposition: A | Source: Ambulatory Visit | Attending: Orthopedic Surgery | Admitting: Orthopedic Surgery

## 2024-04-24 ENCOUNTER — Other Ambulatory Visit (HOSPITAL_BASED_OUTPATIENT_CLINIC_OR_DEPARTMENT_OTHER): Payer: Self-pay

## 2024-04-24 DIAGNOSIS — M19011 Primary osteoarthritis, right shoulder: Principal | ICD-10-CM | POA: Insufficient documentation

## 2024-04-24 DIAGNOSIS — Z853 Personal history of malignant neoplasm of breast: Secondary | ICD-10-CM | POA: Insufficient documentation

## 2024-04-24 DIAGNOSIS — G8918 Other acute postprocedural pain: Secondary | ICD-10-CM | POA: Diagnosis not present

## 2024-04-24 DIAGNOSIS — Z87891 Personal history of nicotine dependence: Secondary | ICD-10-CM | POA: Insufficient documentation

## 2024-04-24 DIAGNOSIS — Z471 Aftercare following joint replacement surgery: Secondary | ICD-10-CM | POA: Diagnosis not present

## 2024-04-24 DIAGNOSIS — Z8673 Personal history of transient ischemic attack (TIA), and cerebral infarction without residual deficits: Secondary | ICD-10-CM | POA: Insufficient documentation

## 2024-04-24 DIAGNOSIS — J45909 Unspecified asthma, uncomplicated: Secondary | ICD-10-CM | POA: Insufficient documentation

## 2024-04-24 DIAGNOSIS — Z7982 Long term (current) use of aspirin: Secondary | ICD-10-CM | POA: Insufficient documentation

## 2024-04-24 DIAGNOSIS — Z96611 Presence of right artificial shoulder joint: Principal | ICD-10-CM

## 2024-04-24 DIAGNOSIS — I10 Essential (primary) hypertension: Secondary | ICD-10-CM | POA: Diagnosis not present

## 2024-04-24 DIAGNOSIS — Z79899 Other long term (current) drug therapy: Secondary | ICD-10-CM | POA: Insufficient documentation

## 2024-04-24 LAB — POCT I-STAT, CHEM 8
BUN: 31 mg/dL — ABNORMAL HIGH (ref 8–23)
Calcium, Ion: 0.74 mmol/L — CL (ref 1.15–1.40)
Chloride: 111 mmol/L (ref 98–111)
Creatinine, Ser: 0.7 mg/dL (ref 0.44–1.00)
Glucose, Bld: 80 mg/dL (ref 70–99)
HCT: 38 % (ref 36.0–46.0)
Hemoglobin: 12.9 g/dL (ref 12.0–15.0)
Potassium: 7.2 mmol/L (ref 3.5–5.1)
Sodium: 131 mmol/L — ABNORMAL LOW (ref 135–145)
TCO2: 19 mmol/L — ABNORMAL LOW (ref 22–32)

## 2024-04-24 LAB — BASIC METABOLIC PANEL WITH GFR
Anion gap: 9 (ref 5–15)
BUN: 19 mg/dL (ref 8–23)
CO2: 26 mmol/L (ref 22–32)
Calcium: 8.9 mg/dL (ref 8.9–10.3)
Chloride: 106 mmol/L (ref 98–111)
Creatinine, Ser: 0.64 mg/dL (ref 0.44–1.00)
GFR, Estimated: >60 mL/min (ref >60)
Glucose, Bld: 73 mg/dL (ref 70–99)
Potassium: 3.9 mmol/L (ref 3.5–5.1)
Sodium: 141 mmol/L (ref 135–145)

## 2024-04-24 SURGERY — ARTHROPLASTY, SHOULDER, TOTAL, REVERSE
Anesthesia: General | Site: Shoulder | Laterality: Right

## 2024-04-24 MED ORDER — VANCOMYCIN HCL 1000 MG IV SOLR
INTRAVENOUS | Status: AC
  Filled 2024-04-24: qty 20

## 2024-04-24 MED ORDER — VITAMIN B-12 1000 MCG PO TABS
1000.0000 ug | ORAL_TABLET | Freq: Every day | ORAL | Status: DC
  Administered 2024-04-25: 1000 ug via ORAL
  Filled 2024-04-24: qty 1

## 2024-04-24 MED ORDER — PANTOPRAZOLE SODIUM 40 MG PO TBEC: 40.0000 mg | DELAYED_RELEASE_TABLET | Freq: Every day | ORAL | Status: DC

## 2024-04-24 MED ORDER — MENTHOL 3 MG MT LOZG: 1.0000 | LOZENGE | OROMUCOSAL | Status: DC | PRN

## 2024-04-24 MED ORDER — TRANEXAMIC ACID-NACL 1000-0.7 MG/100ML-% IV SOLN
1000.0000 mg | Freq: Once | INTRAVENOUS | Status: AC
  Administered 2024-04-24: 1000 mg via INTRAVENOUS
  Filled 2024-04-24: qty 100

## 2024-04-24 MED ORDER — CEFAZOLIN SODIUM-DEXTROSE 2-4 GM/100ML-% IV SOLN
2.0000 g | Freq: Four times a day (QID) | INTRAVENOUS | Status: AC
  Administered 2024-04-24 – 2024-04-25 (×2): 2 g via INTRAVENOUS
  Filled 2024-04-24 (×2): qty 100

## 2024-04-24 MED ORDER — FERROUS GLUCONATE 324 (38 FE) MG PO TABS
324.0000 mg | ORAL_TABLET | Freq: Every day | ORAL | Status: DC
  Administered 2024-04-25: 324 mg via ORAL
  Filled 2024-04-24: qty 1

## 2024-04-24 MED ORDER — MORPHINE SULFATE (PF) 2 MG/ML IV SOLN: 0.5000 mg | INTRAVENOUS | Status: DC | PRN

## 2024-04-24 MED ORDER — SUGAMMADEX SODIUM 200 MG/2ML IV SOLN
INTRAVENOUS | Status: AC
  Filled 2024-04-24: qty 2

## 2024-04-24 MED ORDER — B-12 5000 MCG SL SUBL: 5000.0000 ug | SUBLINGUAL_TABLET | Freq: Every day | SUBLINGUAL | Status: DC

## 2024-04-24 MED ORDER — VANCOMYCIN HCL 1 G IV SOLR
INTRAVENOUS | Status: DC | PRN
  Administered 2024-04-24: 1000 mg via TOPICAL

## 2024-04-24 MED ORDER — METHOCARBAMOL 500 MG PO TABS
500.0000 mg | ORAL_TABLET | Freq: Three times a day (TID) | ORAL | 1 refills | Status: AC | PRN
  Filled 2024-04-24 – 2024-04-25 (×3): qty 40, 14d supply, fill #0

## 2024-04-24 MED ORDER — MIDAZOLAM HCL 2 MG/2ML IJ SOLN
INTRAMUSCULAR | Status: AC
  Filled 2024-04-24: qty 2

## 2024-04-24 MED ORDER — HYDROCODONE-ACETAMINOPHEN 5-325 MG PO TABS
1.0000 | ORAL_TABLET | Freq: Four times a day (QID) | ORAL | 0 refills | Status: DC | PRN
  Filled 2024-04-24 – 2024-04-25 (×2): qty 30, 8d supply, fill #0

## 2024-04-24 MED ORDER — DOCUSATE SODIUM 100 MG PO CAPS
100.0000 mg | ORAL_CAPSULE | Freq: Two times a day (BID) | ORAL | Status: DC
  Administered 2024-04-24 – 2024-04-25 (×2): 100 mg via ORAL
  Filled 2024-04-24 (×2): qty 1

## 2024-04-24 MED ORDER — VITAMIN D3 1.25 MG (50000 UT) PO CAPS: 50000.0000 [IU] | ORAL_CAPSULE | ORAL | Status: DC

## 2024-04-24 MED ORDER — ROCURONIUM BROMIDE 10 MG/ML (PF) SYRINGE
PREFILLED_SYRINGE | INTRAVENOUS | Status: AC
  Filled 2024-04-24: qty 10

## 2024-04-24 MED ORDER — POLYETHYLENE GLYCOL 3350 17 G PO PACK: 17.0000 g | PACK | Freq: Every day | ORAL | Status: DC | PRN

## 2024-04-24 MED ORDER — ASPIRIN 81 MG PO TBEC
81.0000 mg | DELAYED_RELEASE_TABLET | Freq: Every day | ORAL | Status: DC
  Administered 2024-04-25: 81 mg via ORAL
  Filled 2024-04-24: qty 1

## 2024-04-24 MED ORDER — FENTANYL CITRATE (PF) 50 MCG/ML IJ SOSY
50.0000 ug | PREFILLED_SYRINGE | INTRAMUSCULAR | Status: DC
  Administered 2024-04-24: 50 ug via INTRAVENOUS
  Filled 2024-04-24: qty 2

## 2024-04-24 MED ORDER — FENTANYL CITRATE (PF) 100 MCG/2ML IJ SOLN
INTRAMUSCULAR | Status: AC
  Filled 2024-04-24: qty 2

## 2024-04-24 MED ORDER — ALBUTEROL SULFATE (2.5 MG/3ML) 0.083% IN NEBU: 2.5000 mg | INHALATION_SOLUTION | Freq: Four times a day (QID) | RESPIRATORY_TRACT | Status: DC | PRN

## 2024-04-24 MED ORDER — ALIVE ONCE DAILY WOMENS PO TABS: ORAL_TABLET | Freq: Every day | ORAL | Status: DC

## 2024-04-24 MED ORDER — ONDANSETRON HCL 4 MG PO TABS: 4.0000 mg | ORAL_TABLET | Freq: Four times a day (QID) | ORAL | Status: DC | PRN

## 2024-04-24 MED ORDER — METHOCARBAMOL 1000 MG/10ML IJ SOLN: 500.0000 mg | Freq: Four times a day (QID) | INTRAMUSCULAR | Status: DC | PRN

## 2024-04-24 MED ORDER — BUPIVACAINE-EPINEPHRINE (PF) 0.25% -1:200000 IJ SOLN
INTRAMUSCULAR | Status: AC
  Filled 2024-04-24: qty 30

## 2024-04-24 MED ORDER — TRANEXAMIC ACID-NACL 1000-0.7 MG/100ML-% IV SOLN
1000.0000 mg | INTRAVENOUS | Status: AC
  Administered 2024-04-24: 1000 mg via INTRAVENOUS
  Filled 2024-04-24: qty 100

## 2024-04-24 MED ORDER — HYDROCODONE-ACETAMINOPHEN 5-325 MG PO TABS: 1.0000 | ORAL_TABLET | ORAL | Status: DC | PRN

## 2024-04-24 MED ORDER — FENTANYL CITRATE (PF) 100 MCG/2ML IJ SOLN
INTRAMUSCULAR | Status: DC | PRN
  Administered 2024-04-24: 50 ug via INTRAVENOUS

## 2024-04-24 MED ORDER — ONDANSETRON HCL 4 MG/2ML IJ SOLN
INTRAMUSCULAR | Status: DC | PRN
  Administered 2024-04-24: 4 mg via INTRAVENOUS

## 2024-04-24 MED ORDER — 0.9 % SODIUM CHLORIDE (POUR BTL) OPTIME
TOPICAL | Status: DC | PRN
  Administered 2024-04-24: 1000 mL

## 2024-04-24 MED ORDER — BUPIVACAINE-EPINEPHRINE (PF) 0.5% -1:200000 IJ SOLN
INTRAMUSCULAR | Status: DC | PRN
  Administered 2024-04-24: 15 mL via PERINEURAL

## 2024-04-24 MED ORDER — LACTATED RINGERS IV SOLN: INTRAVENOUS | Status: DC

## 2024-04-24 MED ORDER — ORAL CARE MOUTH RINSE: 15.0000 mL | Freq: Once | OROMUCOSAL | Status: AC

## 2024-04-24 MED ORDER — CEFAZOLIN SODIUM-DEXTROSE 2-4 GM/100ML-% IV SOLN
2.0000 g | INTRAVENOUS | Status: AC
  Administered 2024-04-24: 2 g via INTRAVENOUS
  Filled 2024-04-24: qty 100

## 2024-04-24 MED ORDER — PHENOL 1.4 % MT LIQD: 1.0000 | OROMUCOSAL | Status: DC | PRN

## 2024-04-24 MED ORDER — PSYLLIUM 95 % PO PACK
1.0000 | PACK | Freq: Every day | ORAL | Status: DC
  Administered 2024-04-25: 1 via ORAL
  Filled 2024-04-24: qty 1

## 2024-04-24 MED ORDER — LIDOCAINE HCL (CARDIAC) PF 100 MG/5ML IV SOSY
PREFILLED_SYRINGE | INTRAVENOUS | Status: DC | PRN
  Administered 2024-04-24: 40 mg via INTRAVENOUS

## 2024-04-24 MED ORDER — HYDROMORPHONE HCL 1 MG/ML IJ SOLN: 0.2500 mg | INTRAMUSCULAR | Status: DC | PRN

## 2024-04-24 MED ORDER — FESOTERODINE FUMARATE ER 4 MG PO TB24
4.0000 mg | ORAL_TABLET | Freq: Every day | ORAL | Status: DC
  Administered 2024-04-25: 4 mg via ORAL
  Filled 2024-04-24: qty 1

## 2024-04-24 MED ORDER — SUGAMMADEX SODIUM 200 MG/2ML IV SOLN
INTRAVENOUS | Status: DC | PRN
  Administered 2024-04-24: 170 mg via INTRAVENOUS

## 2024-04-24 MED ORDER — ONDANSETRON HCL 4 MG/2ML IJ SOLN
INTRAMUSCULAR | Status: AC
  Filled 2024-04-24: qty 2

## 2024-04-24 MED ORDER — METOCLOPRAMIDE HCL 5 MG PO TABS: 5.0000 mg | ORAL_TABLET | Freq: Three times a day (TID) | ORAL | Status: DC | PRN

## 2024-04-24 MED ORDER — METHOCARBAMOL 500 MG PO TABS: 500.0000 mg | ORAL_TABLET | Freq: Four times a day (QID) | ORAL | Status: DC | PRN

## 2024-04-24 MED ORDER — ALBUTEROL SULFATE (SENSOR) 108 (90 BASE) MCG/ACT IN AEPB: 2.0000 | INHALATION_SPRAY | Freq: Four times a day (QID) | RESPIRATORY_TRACT | Status: DC | PRN

## 2024-04-24 MED ORDER — PROPOFOL 10 MG/ML IV BOLUS
INTRAVENOUS | Status: AC
  Filled 2024-04-24: qty 20

## 2024-04-24 MED ORDER — ALBUTEROL SULFATE HFA 108 (90 BASE) MCG/ACT IN AERS: 1.0000 | INHALATION_SPRAY | RESPIRATORY_TRACT | Status: DC | PRN

## 2024-04-24 MED ORDER — SODIUM CHLORIDE 0.9 % IV SOLN: INTRAVENOUS | Status: DC

## 2024-04-24 MED ORDER — ONDANSETRON HCL 4 MG/2ML IJ SOLN: 4.0000 mg | Freq: Four times a day (QID) | INTRAMUSCULAR | Status: DC | PRN

## 2024-04-24 MED ORDER — VALACYCLOVIR HCL 500 MG PO TABS
500.0000 mg | ORAL_TABLET | Freq: Every day | ORAL | Status: DC
  Administered 2024-04-25: 500 mg via ORAL
  Filled 2024-04-24: qty 1

## 2024-04-24 MED ORDER — SALINE SPRAY 0.65 % NA SOLN: 1.0000 | Freq: Every evening | NASAL | Status: DC | PRN

## 2024-04-24 MED ORDER — ACETAMINOPHEN 500 MG PO TABS: 1000.0000 mg | ORAL_TABLET | Freq: Every day | ORAL | Status: DC | PRN

## 2024-04-24 MED ORDER — BUPIVACAINE LIPOSOME 1.3 % IJ SUSP
INTRAMUSCULAR | Status: DC | PRN
  Administered 2024-04-24: 10 mL via PERINEURAL

## 2024-04-24 MED ORDER — ADULT MULTIVITAMIN W/MINERALS CH
1.0000 | ORAL_TABLET | Freq: Every day | ORAL | Status: DC
  Administered 2024-04-25: 1 via ORAL
  Filled 2024-04-24: qty 1

## 2024-04-24 MED ORDER — SILVER SULFADIAZINE 1 % EX CREA
1.0000 | TOPICAL_CREAM | Freq: Every day | CUTANEOUS | Status: DC
  Filled 2024-04-24: qty 50

## 2024-04-24 MED ORDER — CHLORHEXIDINE GLUCONATE 0.12 % MT SOLN
15.0000 mL | Freq: Once | OROMUCOSAL | Status: AC
  Administered 2024-04-24: 15 mL via OROMUCOSAL

## 2024-04-24 MED ORDER — CO Q 10 10 MG PO CAPS: ORAL_CAPSULE | Freq: Every day | ORAL | Status: DC

## 2024-04-24 MED ORDER — MUSCLE RUB 10-15 % EX CREA: TOPICAL_CREAM | Freq: Every day | CUTANEOUS | Status: DC | PRN

## 2024-04-24 MED ORDER — LORATADINE 10 MG PO TABS
10.0000 mg | ORAL_TABLET | Freq: Every day | ORAL | Status: DC
  Administered 2024-04-25: 10 mg via ORAL
  Filled 2024-04-24: qty 1

## 2024-04-24 MED ORDER — ONDANSETRON HCL 4 MG PO TABS
4.0000 mg | ORAL_TABLET | Freq: Three times a day (TID) | ORAL | 1 refills | Status: AC | PRN
  Filled 2024-04-24 – 2024-04-25 (×3): qty 30, 10d supply, fill #0

## 2024-04-24 MED ORDER — METOCLOPRAMIDE HCL 5 MG/ML IJ SOLN: 5.0000 mg | Freq: Three times a day (TID) | INTRAMUSCULAR | Status: DC | PRN

## 2024-04-24 MED ORDER — ROSUVASTATIN CALCIUM 20 MG PO TABS
20.0000 mg | ORAL_TABLET | Freq: Every day | ORAL | Status: DC
  Administered 2024-04-25: 20 mg via ORAL
  Filled 2024-04-24: qty 1

## 2024-04-24 MED ORDER — PHENYLEPHRINE HCL-NACL 20-0.9 MG/250ML-% IV SOLN
INTRAVENOUS | Status: DC | PRN
  Administered 2024-04-24: 20 ug/min via INTRAVENOUS

## 2024-04-24 MED ORDER — PHENYLEPHRINE 80 MCG/ML (10ML) SYRINGE FOR IV PUSH (FOR BLOOD PRESSURE SUPPORT)
PREFILLED_SYRINGE | INTRAVENOUS | Status: AC
  Filled 2024-04-24: qty 10

## 2024-04-24 MED ORDER — ROCURONIUM BROMIDE 10 MG/ML (PF) SYRINGE
PREFILLED_SYRINGE | INTRAVENOUS | Status: DC | PRN
  Administered 2024-04-24: 40 mg via INTRAVENOUS

## 2024-04-24 MED ORDER — MIDAZOLAM HCL (PF) 2 MG/2ML IJ SOLN
1.0000 mg | INTRAMUSCULAR | Status: DC
  Administered 2024-04-24 (×2): 1 mg via INTRAVENOUS
  Filled 2024-04-24: qty 2

## 2024-04-24 MED ORDER — LISINOPRIL 5 MG PO TABS
5.0000 mg | ORAL_TABLET | Freq: Every day | ORAL | Status: DC
  Administered 2024-04-25: 5 mg via ORAL
  Filled 2024-04-24: qty 1

## 2024-04-24 MED ORDER — BUPIVACAINE-EPINEPHRINE (PF) 0.25% -1:200000 IJ SOLN
INTRAMUSCULAR | Status: DC | PRN
  Administered 2024-04-24: 20 mL

## 2024-04-24 MED ORDER — OYSTER SHELL CALCIUM/D3 500-5 MG-MCG PO TABS: 1.0000 | ORAL_TABLET | Freq: Every day | ORAL | Status: DC

## 2024-04-24 MED ORDER — PROPOFOL 10 MG/ML IV BOLUS
INTRAVENOUS | Status: DC | PRN
  Administered 2024-04-24: 120 mg via INTRAVENOUS

## 2024-04-24 MED ORDER — TROLAMINE SALICYLATE 10 % EX CREA: TOPICAL_CREAM | Freq: Every day | CUTANEOUS | Status: DC | PRN

## 2024-04-24 MED ORDER — STERILE WATER FOR IRRIGATION IR SOLN
Status: DC | PRN
  Administered 2024-04-24: 2000 mL

## 2024-04-24 SURGICAL SUPPLY — 59 items
BAG COUNTER SPONGE SURGICOUNT (BAG) ×2 IMPLANT
BAG ZIPLOCK 12X15 (MISCELLANEOUS) IMPLANT
BIT DRILL 1.6MX128 (BIT) IMPLANT
BIT DRILL 170X2.5X (BIT) IMPLANT
BLADE SAG 18X100X1.27 (BLADE) ×2 IMPLANT
COVER BACK TABLE 60X90IN (DRAPES) ×2 IMPLANT
COVER SURGICAL LIGHT HANDLE (MISCELLANEOUS) ×2 IMPLANT
DRAPE INCISE IOBAN 66X45 STRL (DRAPES) ×2 IMPLANT
DRAPE POUCH INSTRU U-SHP 10X18 (DRAPES) ×2 IMPLANT
DRAPE SHEET LG 3/4 BI-LAMINATE (DRAPES) ×2 IMPLANT
DRAPE SURG ORHT 6 SPLT 77X108 (DRAPES) ×4 IMPLANT
DRAPE TOP 10253 STERILE (DRAPES) ×2 IMPLANT
DRAPE U-SHAPE 47X51 STRL (DRAPES) ×2 IMPLANT
DRSG ADAPTIC 3X8 NADH LF (GAUZE/BANDAGES/DRESSINGS) ×2 IMPLANT
DRSG AQUACEL AG ADV 3.5X10 (GAUZE/BANDAGES/DRESSINGS) ×2 IMPLANT
DURAPREP 26ML APPLICATOR (WOUND CARE) ×2 IMPLANT
ECCENTRIC EPIPHYSI MODULAR SZ1 (Trauma) IMPLANT
ELECT BLADE TIP CTD 4 INCH (ELECTRODE) ×2 IMPLANT
ELECT NDL TIP 2.8 STRL (NEEDLE) ×2 IMPLANT
ELECT PENCIL ROCKER SW 15FT (MISCELLANEOUS) ×2 IMPLANT
ELECT REM PT RETURN 15FT ADLT (MISCELLANEOUS) ×2 IMPLANT
FACESHIELD WRAPAROUND OR TEAM (MASK) ×2 IMPLANT
GAUZE PAD ABD 8X10 STRL (GAUZE/BANDAGES/DRESSINGS) ×2 IMPLANT
GAUZE SPONGE 4X4 12PLY STRL (GAUZE/BANDAGES/DRESSINGS) ×2 IMPLANT
GLENOSPHERE DELTA XTEND LAT 38 (Miscellaneous) IMPLANT
GLOVE BIOGEL PI IND STRL 7.5 (GLOVE) ×2 IMPLANT
GLOVE BIOGEL PI IND STRL 8.5 (GLOVE) ×2 IMPLANT
GLOVE ORTHO TXT STRL SZ7.5 (GLOVE) ×2 IMPLANT
GLOVE SURG ORTHO 8.5 STRL (GLOVE) ×2 IMPLANT
GOWN STRL REUS W/ TWL XL LVL3 (GOWN DISPOSABLE) ×4 IMPLANT
KIT BASIN OR (CUSTOM PROCEDURE TRAY) ×2 IMPLANT
KIT TURNOVER KIT A (KITS) ×2 IMPLANT
MANIFOLD NEPTUNE II (INSTRUMENTS) ×2 IMPLANT
METAGLENE DELTA EXTEND (Trauma) IMPLANT
NDL MAYO CATGUT SZ4 TPR NDL (NEEDLE) IMPLANT
NS IRRIG 1000ML POUR BTL (IV SOLUTION) ×2 IMPLANT
PACK SHOULDER (CUSTOM PROCEDURE TRAY) ×2 IMPLANT
PIN GUIDE 1.2 (PIN) IMPLANT
PIN GUIDE GLENOPHERE 1.5MX300M (PIN) IMPLANT
PIN METAGLENE 2.5 (PIN) IMPLANT
PIN STEINMAN FIXATION KNEE (PIN) IMPLANT
RESTRAINT HEAD UNIVERSAL NS (MISCELLANEOUS) ×2 IMPLANT
SCREW 4.5X36MM (Screw) IMPLANT
SCREW LOCK 42 (Screw) IMPLANT
SLING ARM FOAM STRAP LRG (SOFTGOODS) IMPLANT
SPACER 38 PLUS 3 (Spacer) IMPLANT
SPIKE FLUID TRANSFER (MISCELLANEOUS) ×2 IMPLANT
STEM 12 HA (Stem) IMPLANT
STRIP CLOSURE SKIN 1/2X4 (GAUZE/BANDAGES/DRESSINGS) ×2 IMPLANT
SUCTION TUBE FRAZIER 12FR DISP (SUCTIONS) IMPLANT
SUT MNCRL AB 4-0 PS2 18 (SUTURE) ×2 IMPLANT
SUT VIC AB 0 CT1 36 (SUTURE) ×2 IMPLANT
SUT VIC AB 0 CT2 27 (SUTURE) ×2 IMPLANT
SUT VIC AB 2-0 CT1 TAPERPNT 27 (SUTURE) ×2 IMPLANT
SUTURE FIBERWR #2 38 T-5 BLUE (SUTURE) ×4 IMPLANT
SUTURE FIBERWR#2 38 REV NDL BL (SUTURE) IMPLANT
TOWEL GREEN STERILE FF (TOWEL DISPOSABLE) ×2 IMPLANT
TOWEL OR 17X26 10 PK STRL BLUE (TOWEL DISPOSABLE) ×2 IMPLANT
YANKAUER SUCT BULB TIP NO VENT (SUCTIONS) ×2 IMPLANT

## 2024-04-24 NOTE — Transfer of Care (Signed)
 Immediate Anesthesia Transfer of Care Note  Patient: [name]  Procedure(s) Performed: ARTHROPLASTY, SHOULDER, TOTAL, REVERSE (Right: Shoulder)  Patient Location: PACU  Anesthesia Type:General  Level of Consciousness: awake and drowsy  Airway & Oxygen Therapy: Patient Spontanous Breathing and Patient connected to face mask oxygen  Post-op Assessment: Report given to RN and Post -op Vital signs reviewed and stable  Post vital signs: Reviewed and stable  Last Vitals:  Vitals Value Taken Time  BP 145/94 04/24/24 13:50  Temp    Pulse 76 04/24/24 13:55  Resp 14 04/24/24 13:55  SpO2 100 % 04/24/24 13:55  Vitals shown include unfiled device data.  Last Pain:  Vitals:   04/24/24 0905  TempSrc:   PainSc: 7          Complications: No notable events documented.

## 2024-04-24 NOTE — Interval H&P Note (Signed)
 History and Physical Interval Note:  04/24/2024 9:59 AM  [name]  has presented today for surgery, with the diagnosis of Primary osteoarthritis of right shoulder.  The various methods of treatment have been discussed with the patient and family. After consideration of risks, benefits and other options for treatment, the patient has consented to  Procedure(s): ARTHROPLASTY, SHOULDER, TOTAL, REVERSE (Right) as a surgical intervention.  The patient's history has been reviewed, patient examined, no change in status, stable for surgery.  I have reviewed the patient's chart and labs.  Questions were answered to the patient's satisfaction.     Elspeth JONELLE Her

## 2024-04-24 NOTE — Anesthesia Postprocedure Evaluation (Signed)
 Anesthesia Post Note  Patient: Alicia Arsenio Sacks  Procedure(s) Performed: ARTHROPLASTY, SHOULDER, TOTAL, REVERSE (Right: Shoulder)     Patient location during evaluation: PACU Anesthesia Type: General and Regional Level of consciousness: awake and alert Pain management: pain level controlled Vital Signs Assessment: post-procedure vital signs reviewed and stable Respiratory status: spontaneous breathing, nonlabored ventilation and respiratory function stable Cardiovascular status: blood pressure returned to baseline and stable Postop Assessment: no apparent nausea or vomiting Anesthetic complications: no   No notable events documented.  Last Vitals:  Vitals:   04/24/24 1415 04/24/24 1430  BP: 139/71 138/79  Pulse: 68 (!) 58  Resp: 15 11  Temp:    SpO2: 93% 94%    Last Pain:  Vitals:   04/24/24 1430  TempSrc:   PainSc: 3                  Carine Nordgren,W. EDMOND

## 2024-04-24 NOTE — Discharge Instructions (Signed)
 Ice to the shoulder constantly.  Keep the incision covered and clean and dry for one week, then ok to get it wet in the shower. May shower with the Aquacel bandage on(waterproof)   Do exercise as instructed several times per day.  DO NOT reach behind your back or push up out of a chair with the operative arm.  Use a sling while you are up and around for comfort, may remove while seated.  Keep pillow propped behind the operative elbow.  Follow up with Dr Kay in two weeks in the office, call [phone] for appt  Please call Dr Kay (cell) [phone] with any questions or concerns

## 2024-04-24 NOTE — Op Note (Signed)
 NAMEPHILICIA, [name] MEDICAL RECORD NO: [id number] ACCOUNT NO: [id number] DATE OF BIRTH: [birth year] FACILITY: [facility] LOCATION: [facility] PHYSICIAN: Elspeth SAUNDERS. Kay, MD  Operative Report   DATE OF PROCEDURE: 04/24/2024  PREOPERATIVE DIAGNOSIS:  Right shoulder end-stage osteoarthritis.  POSTOPERATIVE DIAGNOSIS:  Right shoulder end-stage osteoarthritis.  PROCEDURE PERFORMED:  Right reverse total shoulder arthroplasty using DePuy Delta Xtend prosthesis with no subscapularis repair.  ATTENDING SURGEON:  Elspeth SAUNDERS. Kay, MD  ASSISTANT:  Debby Crock [place], [place], who was scrubbed during the entire procedure, and necessary for satisfactory completion of surgery.  ANESTHESIA:  General anesthesia was used plus interscalene block.  ESTIMATED BLOOD LOSS:  Less than 100 mL.  FLUID REPLACEMENT:  1000 mL crystalloid.  COUNTS:  Instrument counts correct.  COMPLICATIONS:  No complications.  ANTIBIOTICS:  Perioperative antibiotics were given.  INDICATIONS:  The patient is a 63 year old female who presents with a history of worsening right shoulder pain.  The patient has a history of prior right shoulder rotator cuff surgery.  She has had a failure of conservative management with ongoing pain  due to progressive arthritis in the shoulder.  Given her pain level and interference with ADLs, we discussed options and agreed mutually to proceed with reverse total shoulder arthroplasty.  Risks including but not limited to instability and infection  were discussed with the patient and informed consent obtained.  DESCRIPTION OF PROCEDURE:  After an adequate level of anesthesia was achieved, the patient was positioned in the modified beach chair position.  Right shoulder correctly identified and sterile prep and drape performed.  Timeout called verifying correct  patient, correct site.  We entered the shoulder using a deltopectoral incision starting at the coracoid process and extending down the anterior  humerus.  Dissection down through subcutaneous tissues using Bovie.  Cephalic vein was identified and taken  laterally with the deltoid.  Pectoralis taken medially.  Conjoined tendon identified and retracted medially.  Deep retractors placed.  Biceps likely re-tenodesed in situ with 0 Vicryl figure-of-eight suture x2.  We then released the subscapularis  subperiosteally off the lesser tuberosity.  This was tagged for protection of the axillary nerve.  This had been repaired previously and was not felt to be repairable.  We then released the inferior capsule.  We extended the shoulder, delivering the  humeral head out of the wound.  There was advanced cartilage loss on the humeral side.  We entered the proximal humerus with a 6 mm reamer, reaming up to size 12.  We then placed our 12 mm T-handle guide and resected the head at 20 degrees of  retroversion.  We removed excess osteophytes with a rongeur.  We then went ahead and subluxed the humerus posteriorly, gaining good exposure of the glenoid.  Removed the capsule and labrum and the remaining cartilage from the glenoid.  With our deep retractors placed, we drilled our guide pin centered low on the  glenoid.  We then reamed to subchondral bone for the baseplate.  We then did our peripheral T-handled reamer to clear room for the glenosphere.  Next, we drilled out our central peg hole.  We irrigated thoroughly.  We impacted the HA-coated press-fit  baseplate in position.  We placed a 42 screw at the base of the plate into the inferior scapula.  We then placed a 36 screw at the base of the coracoid superiorly.  The anterior and posterior screws could not be placed due to the small anterior to  posterior diameter of the glenoid.  With the baseplate secure, we went ahead and selected the real 38 +0 standard glenosphere and attached that to the baseplate with the screwdriver.  Once that was in place, I did a finger sweep to make sure we had no  soft tissue caught  up between the baseplate and the glenosphere.  With the glenosphere in place, we went ahead and went back to the humeral side.  We reamed for the 1 right metaphysis.  We next used the trial stem with the 12 stem and then the 1 right metaphysis set in the 0 setting and placed in 20 degrees of  retroversion.  We trialled with the 38 +3 poly.  We reduced the shoulder, had a nice little pop as it reduced, with appropriate stability throughout a full arc of motion.  We irrigated thoroughly and removed all trial components.  Using available bone  graft from the humeral head and an impaction grafting technique, we impacted the HA-coated press-fit 12 stem with the 1 right metaphysis set in the 0 setting and impacted in 20 degrees of retroversion.  The stem was stable and at the appropriate height.   Thus, we selected the real 38 +3 poly, placed on the humeral tray, impacting that, and then reduced the shoulder with a nice little pop.  Excellent stability with the shoulder throughout a full range of motion.  No gapping with inferior pull or external  rotation.  We irrigated again.  We then went ahead.  I had used a little bit of the vancomycin  powder inside the canal prior to impacting the stem and then we used the remaining vancomycin  powder in the deep wound after resecting the subscapularis remnant.  We  closed the deltopectoral interval with number 1 or 0 Vicryl suture, followed by 2-0 Vicryl for subcutaneous closure and 4-0 running Monocryl for skin.  Steri-Strips were applied, followed by a sterile dressing.  The patient tolerated surgery well.   PUS D: 04/24/2024 1:51:32 pm T: 04/24/2024 2:07:00 pm  JOB: [id number]/ [id number]

## 2024-04-24 NOTE — Brief Op Note (Signed)
 04/24/2024  1:41 PM  PATIENT:  [name]  63 y.o. female  PRE-OPERATIVE DIAGNOSIS:  Primary osteoarthritis of right shoulder  POST-OPERATIVE DIAGNOSIS:  Primary osteoarthritis of right shoulder  PROCEDURE:  Procedure(s): ARTHROPLASTY, SHOULDER, TOTAL, REVERSE (Right) DePuy Delta Xtend with no subscap repair  SURGEON:  Surgeons and Role:    DEWAINE Kay Kemps, MD - Primary  PHYSICIAN ASSISTANT:   ASSISTANTS: Debby KATHEE Fireman, PA-C   ANESTHESIA:   regional and general  EBL:  less than  100 cc  BLOOD ADMINISTERED:none  DRAINS: none   LOCAL MEDICATIONS USED:  MARCAINE      SPECIMEN:  No Specimen  DISPOSITION OF SPECIMEN:  N/A  COUNTS:  YES  TOURNIQUET:  * No tourniquets in log *  DICTATION: .Other Dictation: Dictation Number [id number]  PLAN OF CARE: Admit for overnight observation  PATIENT DISPOSITION:  PACU - hemodynamically stable.   Delay start of Pharmacological VTE agent (>24hrs) due to surgical blood loss or risk of bleeding: not applicable

## 2024-04-24 NOTE — Anesthesia Procedure Notes (Addendum)
 Procedure Name: Intubation Date/Time: 04/24/2024 12:19 PM  Performed by: Metta Andrea NOVAK, CRNAPre-anesthesia Checklist: Patient identified, Emergency Drugs available, Suction available and Patient being monitored Patient Re-evaluated:Patient Re-evaluated prior to induction Oxygen Delivery Method: Circle system utilized Preoxygenation: Pre-oxygenation with 100% oxygen Induction Type: IV induction Ventilation: Mask ventilation without difficulty Laryngoscope Size: Mac and 3 Grade View: Grade II Tube type: Oral Tube size: 7.0 mm Number of attempts: 1 Airway Equipment and Method: Stylet and Oral airway Placement Confirmation: ETT inserted through vocal cords under direct vision, positive ETCO2 and breath sounds checked- equal and bilateral Secured at: 22 cm Tube secured with: Tape Dental Injury: Teeth and Oropharynx as per pre-operative assessment  Comments: ETT placed by Madelaine London, SRNA with supervision by Dr. Epifanio and Andrea, CRNA

## 2024-04-24 NOTE — Anesthesia Procedure Notes (Signed)
 Anesthesia Regional Block: Interscalene brachial plexus block   Pre-Anesthetic Checklist: , timeout performed,  Correct Patient, Correct Site, Correct Laterality,  Correct Procedure, Correct Position, site marked,  Risks and benefits discussed,  Pre-op evaluation,  At surgeon's request and post-op pain management  Laterality: Right  Prep: Maximum Sterile Barrier Precautions used, chloraprep       Needles:  Injection technique: Single-shot  Needle Type: Echogenic Stimulator Needle     Needle Length: 5cm  Needle Gauge: 22     Additional Needles:   Procedures:, nerve stimulator,,, ultrasound used (permanent image in chart),,     Nerve Stimulator or Paresthesia:  Response: Biceps response, 0.48 mA  Additional Responses:   Narrative:  Start time: 04/24/2024 10:34 AM End time: 04/24/2024 10:44 AM Injection made incrementally with aspirations every 5 mL.  Performed by: Personally  Anesthesiologist: Epifanio Fallow, MD  Additional Notes:

## 2024-04-24 NOTE — Plan of Care (Signed)
  Problem: Health Behavior/Discharge Planning: Goal: Ability to manage health-related needs will improve Outcome: Progressing   Problem: Clinical Measurements: Goal: Ability to maintain clinical measurements within normal limits will improve Outcome: Progressing Goal: Will remain free from infection Outcome: Progressing Goal: Diagnostic test results will improve Outcome: Progressing Goal: Respiratory complications will improve Outcome: Progressing Goal: Cardiovascular complication will be avoided Outcome: Progressing   Problem: Activity: Goal: Risk for activity intolerance will decrease Outcome: Progressing   Problem: Nutrition: Goal: Adequate nutrition will be maintained Outcome: Progressing   Problem: Coping: Goal: Level of anxiety will decrease Outcome: Progressing   Problem: Elimination: Goal: Will not experience complications related to bowel motility Outcome: Progressing Goal: Will not experience complications related to urinary retention Outcome: Progressing   Problem: Pain Managment: Goal: General experience of comfort will improve and/or be controlled Outcome: Progressing   Problem: Safety: Goal: Ability to remain free from injury will improve Outcome: Progressing   Problem: Skin Integrity: Goal: Risk for impaired skin integrity will decrease Outcome: Progressing   Problem: Education: Goal: Knowledge of the prescribed therapeutic regimen will improve Outcome: Progressing Goal: Understanding of activity limitations/precautions following surgery will improve Outcome: Progressing Goal: Individualized Educational Video(s) Outcome: Progressing   Problem: Activity: Goal: Ability to tolerate increased activity will improve Outcome: Progressing   Problem: Pain Management: Goal: Pain level will decrease with appropriate interventions Outcome: Progressing

## 2024-04-25 ENCOUNTER — Other Ambulatory Visit (HOSPITAL_COMMUNITY): Payer: Self-pay

## 2024-04-25 ENCOUNTER — Encounter: Payer: Self-pay | Admitting: Hematology and Oncology

## 2024-04-25 MED ORDER — PANTOPRAZOLE SODIUM 40 MG PO TBEC
40.0000 mg | DELAYED_RELEASE_TABLET | Freq: Every day | ORAL | Status: DC
  Administered 2024-04-25: 40 mg via ORAL
  Filled 2024-04-25: qty 1

## 2024-04-25 NOTE — Discharge Summary (Signed)
 In most cases prophylactic antibiotics for Dental procdeures after total joint surgery are not necessary.  Exceptions are as follows:  1. History of prior total joint infection  2. Severely immunocompromised (Organ Transplant, cancer chemotherapy, Rheumatoid biologic meds such as Humera)  3. Poorly controlled diabetes (A1C &gt; 8.0, blood glucose over 200)  If you have one of these conditions, contact your surgeon for an antibiotic prescription, prior to your dental procedure. Orthopedic Discharge Summary        Physician Discharge Summary  Patient ID: [name] MRN: [id number] DOB/AGE: [birth year] 63 y.o.  Admit date: 04/24/2024 Discharge date: 04/25/2024   Procedures:  Procedure(s) (LRB): ARTHROPLASTY, SHOULDER, TOTAL, REVERSE (Right)  Attending Physician:  Dr. Elspeth Her  Admission Diagnoses:   Right shoulder end stage OA  Discharge Diagnoses:  same   Past Medical History:  Diagnosis Date   Anemia    Arthritis    osteoarthritis   Asthma    states no asthma attack since 2002   Breast cancer (HCC) 06/26/16 bx   left breast   Chronic back pain    Complication of anesthesia    states takes more than normal to put her to sleep   Constipation    Dental bridge present    upper   Dental crowns present    DVT of upper extremity (deep vein thrombosis) (HCC)    Fibromyalgia    Genetic testing 09/19/2016   Ms. [name] underwent genetic counseling and testing for hereditary cancer syndromes on 08/21/2016. Her results were negative for mutations in all 46 genes analyzed by [employer]'s 46-gene Common Hereditary Cancers Panel. Genes analyzed include: APC, ATM, AXIN2, BARD1, BMPR1A, BRCA1, BRCA2, BRIP1, CDH1, CDKN2A, CHEK2, CTNNA1, DICER1, EPCAM, GREM1, HOXB13, KIT, MEN1, MLH1, MSH2, MSH3, MSH6, MUTYH, NBN,    GERD (gastroesophageal reflux disease)    History of blood transfusion 06/2005   History of gallstones    History of pneumonia    History of shingles  07/2011   HTN (hypertension)    Itching    Joint pain    Muscle weakness    Neuropathy    Normal coronary arteries 2003   Osteoarthropathy    Personal history of chemotherapy 2018   Personal history of radiation therapy 2018   PFO (patent foramen ovale)    Pneumonia    HX OF    Presence of inferior vena cava filter    Pulmonary embolism (HCC)    Sleep apnea    NOT SINCE 2009    Status post gastric bypass for obesity    Stomach pain    Stroke (HCC) 12/2002   right-sided weakness   Urinary incontinence     PCP: Arloa Elsie SAUNDERS, MD   Discharged Condition: good  Hospital Course:  Patient underwent the above stated procedure on 04/24/2024. Patient tolerated the procedure well and brought to the recovery room in good condition and subsequently to the floor. Patient had an uncomplicated hospital course and was stable for discharge.   Disposition: Discharge disposition: 01-Home or Self Care      with follow up in 2 weeks    Follow-up Information     Her Kemps, MD. Call in 2 week(s).   Specialty: Orthopedic Surgery Why: please call the office [phone] for appt in two weeks Contact information: [address] [place] [address] [phone]                 Dental Antibiotics:  In most cases prophylactic  antibiotics for Dental procdeures after total joint surgery are not necessary.  Exceptions are as follows:  1. History of prior total joint infection  2. Severely immunocompromised (Organ Transplant, cancer chemotherapy, Rheumatoid biologic meds such as Humera)  3. Poorly controlled diabetes (A1C &gt; 8.0, blood glucose over 200)  If you have one of these conditions, contact your surgeon for an antibiotic prescription, prior to your dental procedure.  Discharge Instructions     Call MD / Call 911   Complete by: As directed    If you experience chest pain or shortness of breath, CALL 911 and be transported to the hospital  emergency room.  If you develope a fever above 101 F, pus (white drainage) or increased drainage or redness at the wound, or calf pain, call your surgeon's office.   Constipation Prevention   Complete by: As directed    Drink plenty of fluids.  Prune juice may be helpful.  You may use a stool softener, such as Colace (over the counter) 100 mg twice a day.  Use MiraLax  (over the counter) for constipation as needed.   Diet - low sodium heart healthy   Complete by: As directed    Increase activity slowly as tolerated   Complete by: As directed    Post-operative opioid taper instructions:   Complete by: As directed    POST-OPERATIVE OPIOID TAPER INSTRUCTIONS: It is important to wean off of your opioid medication as soon as possible. If you do not need pain medication after your surgery it is ok to stop day one. Opioids include: Codeine , Hydrocodone (Norco, Vicodin), Oxycodone (Percocet, oxycontin ) and hydromorphone  amongst others.  Long term and even short term use of opiods can cause: Increased pain response Dependence Constipation Depression Respiratory depression And more.  Withdrawal symptoms can include Flu like symptoms Nausea, vomiting And more Techniques to manage these symptoms Hydrate well Eat regular healthy meals Stay active Use relaxation techniques(deep breathing, meditating, yoga) Do Not substitute Alcohol to help with tapering If you have been on opioids for less than two weeks and do not have pain than it is ok to stop all together.  Plan to wean off of opioids This plan should start within one week post op of your joint replacement. Maintain the same interval or time between taking each dose and first decrease the dose.  Cut the total daily intake of opioids by one tablet each day Next start to increase the time between doses. The last dose that should be eliminated is the evening dose.          Allergies as of 04/25/2024       Reactions   Aspirin  Other (See  Comments)   ESOPHAGITIS   Oxycodone  Hcl Diarrhea, Nausea And Vomiting   Propoxyphene N-acetaminophen  Diarrhea, Nausea And Vomiting   Tramadol  Other (See Comments)   Cause migraines   Diclofenac  Sodium Other (See Comments)   Headache; only when using the cream.   Adhesive [tape] Rash, Other (See Comments)   Pulls skin off - please use paper tape   Prednisone  Rash        Medication List     TAKE these medications    acetaminophen  500 MG tablet Commonly known as: TYLENOL  Take 1,000 mg by mouth daily as needed for mild pain (pain score 1-3) or moderate pain (pain score 4-6).   albuterol  108 (90 Base) MCG/ACT inhaler Commonly known as: VENTOLIN  HFA Inhale 1 puff into the lungs every 4 (four) hours as needed.   ALIVE ONCE  DAILY WOMENS PO Take 1 tablet by mouth daily.   aspirin  EC 81 MG tablet Take 81 mg by mouth daily.   B-12 5000 MCG Subl Place 5,000 mcg under the tongue daily at 6 (six) AM.   BLUE-EMU HEMP EX Apply 1 application  topically daily as needed.   CALCIUM  CITRATE-VITAMIN D  PO Take 650 mg by mouth daily at 12 noon.   CO Q 10 PO Take 1 tablet by mouth daily at 12 noon.   diclofenac  75 MG EC tablet Commonly known as: VOLTAREN  Take 1 tablet (75 mg total) by mouth 2 (two) times daily with a meal. What changed:  when to take this reasons to take this   ferrous gluconate  324 MG tablet Commonly known as: FERGON Take 1 tablet (324 mg total) by mouth daily with breakfast. What changed: when to take this   HYDROcodone -acetaminophen  5-325 MG tablet Commonly known as: NORCO/VICODIN Take 1 tablet by mouth every 6 (six) hours as needed for severe pain (pain score 7-10).   lisinopril  5 MG tablet Commonly known as: ZESTRIL  Take 1 tablet (5 mg total) by mouth daily.   loratadine  10 MG tablet Commonly known as: CLARITIN  Take 1 tablet (10 mg total) by mouth daily.   METAMUCIL PO Take 1 Scoop by mouth daily at 12 noon.   methocarbamol  750 MG tablet Commonly  known as: ROBAXIN  Take 1 tablet (750 mg total) by mouth 3 (three) times daily. What changed:  when to take this reasons to take this   methocarbamol  500 MG tablet Commonly known as: ROBAXIN  Take 1 tablet (500 mg total) by mouth every 8 (eight) hours as needed for muscle spasms. What changed: You were already taking a medication with the same name, and this prescription was added. Make sure you understand how and when to take each.   ondansetron  4 MG tablet Commonly known as: Zofran  Take 1 tablet (4 mg total) by mouth every 8 (eight) hours as needed for vomiting, refractory nausea / vomiting or nausea.   pantoprazole  40 MG tablet Commonly known as: PROTONIX  Take 1 tablet (40 mg total) by mouth daily.   ProAir  Digihaler 108 (90 Base) MCG/ACT Aepb Generic drug: Albuterol  Sulfate (sensor) Inhale 2 puffs into the lungs every 6 (six) hours as needed.   rosuvastatin  20 MG tablet Commonly known as: CRESTOR  Take 1 tablet (20 mg total) by mouth daily.   sodium chloride  0.65 % Soln nasal spray Commonly known as: OCEAN Place 1 spray into both nostrils at bedtime as needed for congestion.   solifenacin  10 MG tablet Commonly known as: VESICARE  Take 1 tablet (10 mg total) by mouth daily.   SSD 1 % cream Generic drug: silver  sulfADIAZINE  Apply 1 Application topically daily to the affected area as directed. What changed:  when to take this reasons to take this   valACYclovir  500 MG tablet Commonly known as: VALTREX  Take 500 mg by mouth daily. Patient can take 2 tabs when needed   Vitamin D3 1.25 MG (50000 UT) Caps Take 1 capsule (50,000 Units total) by mouth once a week. What changed: when to take this          Signed: Elspeth JONELLE Her 04/25/2024, 8:31 AM  [facility] is now [facility]  [place] [address], [place], [place] [address] Phone: [phone] Facebook  Instagram  [employer]

## 2024-04-25 NOTE — TOC Initial Note (Signed)
 Transition of Care [facility]) - Initial/Assessment Note    Patient Details  Name: [name] MRN: [id number] Date of Birth: 1961-03-17  [facility] of [facility] CM/SW Contact:    Sonda Manuella Quill, RN Phone Number: 04/25/2024, 5:01 PM  Clinical Narrative:                 Home w/ family support  Expected Discharge Plan: Home/Self Care Barriers to Discharge: No Barriers Identified   Patient Goals and CMS Choice Patient states their goals for this hospitalization and ongoing recovery are:: home          Expected Discharge Plan and Services   Discharge Planning Services: CM Consult   Living arrangements for the past 2 months: Single Family Home Expected Discharge Date: 04/25/24               DME Arranged: N/A DME Agency: NA       HH Arranged: NA HH Agency: NA        Prior Living Arrangements/Services Living arrangements for the past 2 months: Single Family Home Lives with:: Self Patient language and need for interpreter reviewed:: Yes Do you feel safe going back to the place where you live?: Yes      Need for Family Participation in Patient Care: Yes (Comment) Care giver support system in place?: Yes (comment) Current home services: DME (cane) Criminal Activity/Legal Involvement Pertinent to Current Situation/Hospitalization: No - Comment as needed  Activities of Daily Living   ADL Screening (condition at time of admission) Independently performs ADLs?: Yes (appropriate for developmental age) Is the patient deaf or have difficulty hearing?: No Does the patient have difficulty seeing, even when wearing glasses/contacts?: No Does the patient have difficulty concentrating, remembering, or making decisions?: No  Permission Sought/Granted Permission sought to share information with : Case Manager Permission granted to share information with : Yes, Verbal Permission Granted  Share Information with NAME: Case Manager     Permission granted to share  info w Relationship: Staysha Truby (sister) [phone]     Emotional Assessment Appearance:: Appears stated age Attitude/Demeanor/Rapport: Gracious Affect (typically observed): Accepting Orientation: : Oriented to Self, Oriented to Place, Oriented to Situation, Oriented to  Time Alcohol / Substance Use: Not Applicable Psych Involvement: No (comment)  Admission diagnosis:  Arthritis of right shoulder region [M19.011] S/P shoulder replacement, right [Z96.611] Patient Active Problem List   Diagnosis Date Noted   S/P shoulder replacement, right 04/24/2024   H/O total shoulder replacement, left 10/21/2020   Abdominal pain 07/28/2020   Enteritis 07/28/2020   Class 2 severe obesity with serious comorbidity and body mass index (BMI) of 37.0 to 37.9 in adult 01/15/2019   Status post gastric surgery 07/29/2018   Prediabetes 03/06/2018   Vitamin D  deficiency 02/18/2018   Chemotherapy-induced peripheral neuropathy 02/15/2017   Port catheter in place 11/02/2016   Genetic testing 09/19/2016   Pre-operative clearance 07/23/2016   Malignant neoplasm of lower-outer quadrant of left breast of female, estrogen receptor positive (HCC) 07/10/2016   Essential hypertension    Chest pain 09/30/2015   DVT, lower extremity (HCC) 06/18/2011   DVT of upper extremity (deep vein thrombosis) (HCC) 06/18/2011   [place] filter in place 06/18/2011   History of stroke 06/18/2011   PFO (patent foramen ovale) 06/18/2011   Status post gastric bypass for obesity 06/18/2011   PELVIC PAIN, CHRONIC 07/20/2008   FIBROIDS, UTERUS 07/16/2008   Asthma 07/16/2008   FIBROMYALGIA 07/16/2008   CARPAL TUNNEL SYNDROME, HX OF 07/16/2008  History of pulmonary embolus (PE) 07/16/2008   PCP:  Arloa Elsie SAUNDERS, MD Pharmacy:   [facility] [address] [place] [address] Phone: [phone] Fax: [phone]  [facility] [address] [place] [address] Phone: [phone] Fax: [phone]     Social Drivers of Health (SDOH) Social History: SDOH Screenings   Food Insecurity: No Food Insecurity (04/25/2024)  Housing: Low Risk  (04/25/2024)  Transportation Needs: No Transportation Needs (04/25/2024)  Utilities: Not At Risk (04/25/2024)  Tobacco Use: Medium Risk (04/24/2024)   SDOH Interventions: Food Insecurity Interventions: Inpatient TOC Housing Interventions: Intervention Not Indicated, Inpatient TOC Transportation Interventions: Intervention Not Indicated, Inpatient TOC Utilities Interventions: Intervention Not Indicated, Inpatient TOC   Readmission Risk Interventions     No data to display

## 2024-04-25 NOTE — Progress Notes (Signed)
 PIV x 2 removed as noted. AVS reviewed with patient who verbalized an understanding. No other questions at this time. Patient dressing for discharge to home. Sister enroute- will pick up patient from main entrance

## 2024-04-25 NOTE — Plan of Care (Signed)
  Problem: Activity: Goal: Risk for activity intolerance will decrease Outcome: Progressing   Problem: Nutrition: Goal: Adequate nutrition will be maintained Outcome: Progressing   Problem: Coping: Goal: Level of anxiety will decrease Outcome: Progressing   Problem: Elimination: Goal: Will not experience complications related to urinary retention Outcome: Progressing   Problem: Pain Managment: Goal: General experience of comfort will improve and/or be controlled Outcome: Progressing   Problem: Safety: Goal: Ability to remain free from injury will improve Outcome: Progressing   Problem: Skin Integrity: Goal: Risk for impaired skin integrity will decrease Outcome: Progressing   Problem: Activity: Goal: Ability to tolerate increased activity will improve Outcome: Progressing   Problem: Pain Management: Goal: Pain level will decrease with appropriate interventions Outcome: Progressing

## 2024-04-25 NOTE — Care Management Obs Status (Signed)
 MEDICARE OBSERVATION STATUS NOTIFICATION   Patient Details  Name: [name] MRN: [id number] Date of Birth: 03-Dec-1960   Medicare Observation Status Notification Given:  Yes    Sonda Manuella Quill, RN 04/25/2024, 10:51 AM

## 2024-04-25 NOTE — Evaluation (Signed)
 Occupational Therapy Evaluation Patient Details Name: [name] MRN: [id number] DOB: 1961-05-09 Today's Date: 04/25/2024   History of Present Illness   Pt is a  y/o female presenting on 12/5 for R reverse total shoulder surgery. PMH includes: arthritis, asthma, L breast cancer, DVT, fibromyalgia, PFO, PNA, gastric bypass, CVA. L TSA, bil carpal tunnel release, ACDF.     Clinical Impressions PTA patient reports mostly independent with ADLs, IADLs and mobility using cane; reports sister assists as needed. Admitted for above and presents with problem list below.  Patient educated on R shoulder precautions (ROM and NWB), sling mgmt and wear schedule, ADL compensatory techniques, positioning of UE, exercises to elbow and distal, use of R UE for gentle ADLs.  Pt demonstrates ability to complete ADLs with min to mod assist, transfers and mobility in room using cane with contact guard assist.  Simulated tub transfers with contact guard assist. Pt will have support from sister for ADLs, mobility and IADLs as needed.  R UE nerve block intact, and completed PROM to elbow, wrist, forearm and hand (AROM to hand flexion).  Will benefit from continued OT services acutely and after dc per surgeons recommendations.  Will follow.      If plan is discharge home, recommend the following:   A little help with walking and/or transfers;A lot of help with bathing/dressing/bathroom;Assistance with cooking/housework;Assist for transportation;Help with stairs or ramp for entrance     Functional Status Assessment   Patient has had a recent decline in their functional status and demonstrates the ability to make significant improvements in function in a reasonable and predictable amount of time.     Equipment Recommendations   Tub/shower seat     Recommendations for Other Services         Precautions/Restrictions   Precautions Precautions: Shoulder;Fall Type of Shoulder Precautions:  conservative- no shoulder ROM, okay for elbow, hand and wrist.  okay for gentle ADLs. Shoulder Interventions: Shoulder sling/immobilizer;At all times;Off for dressing/bathing/exercises Precaution Booklet Issued: Yes (comment) Recall of Precautions/Restrictions: Intact Precaution/Restrictions Comments: reviewed with pt Required Braces or Orthoses: Sling Restrictions Weight Bearing Restrictions Per Provider Order: Yes RUE Weight Bearing Per Provider Order: Non weight bearing     Mobility Bed Mobility Overal bed mobility: Needs Assistance Bed Mobility: Supine to Sit     Supine to sit: Supervision     General bed mobility comments: exiting on L side, HOB slightly elevated    Transfers Overall transfer level: Needs assistance Equipment used: Straight cane Transfers: Sit to/from Stand Sit to Stand: Contact guard assist                  Balance Overall balance assessment: Mild deficits observed, not formally tested (relies on cane)                                         ADL either performed or assessed with clinical judgement   ADL Overall ADL's : Needs assistance/impaired     Grooming: Set up;Sitting           Upper Body Dressing : Sitting;Moderate assistance   Lower Body Dressing: Moderate assistance;Sit to/from stand   Toilet Transfer: Electronics Engineer Details (indicate cue type and reason): cane Toileting- Clothing Manipulation and Hygiene: Minimal assistance;Sit to/from stand       Functional mobility during ADLs: Cane;Contact guard assist  Vision   Vision Assessment?: No apparent visual deficits     Perception         Praxis         Pertinent Vitals/Pain Pain Assessment Pain Assessment: No/denies pain (post op shoulder with nerve block intact)     Extremity/Trunk Assessment Upper Extremity Assessment Upper Extremity Assessment: Right hand dominant;RUE deficits/detail RUE Deficits  / Details: s/p reverse total shoulder.  nerve block intact.  pt able to active composite flexion with effort but otherwise no active motion.  PROM elbow/wrist/forearm/hand WFL RUE: Unable to fully assess due to immobilization RUE Sensation: decreased light touch;decreased proprioception RUE Coordination: decreased fine motor;decreased gross motor   Lower Extremity Assessment Lower Extremity Assessment: Overall WFL for tasks assessed   Cervical / Trunk Assessment Cervical / Trunk Assessment: Normal   Communication Communication Communication: No apparent difficulties   Cognition Arousal: Alert Behavior During Therapy: WFL for tasks assessed/performed Cognition: No apparent impairments                               Following commands: Intact       Cueing  General Comments   Cueing Techniques: Verbal cues;Visual cues      Exercises Exercises: Shoulder Shoulder Exercises Elbow Flexion: PROM, Right, 10 reps, Seated Elbow Extension: PROM, Right, 10 reps, Seated Wrist Flexion: PROM, Right, 10 reps, Seated Wrist Extension: PROM, Right, 10 reps, Seated Digit Composite Flexion: AROM, Right, 10 reps, Seated Composite Extension: PROM, Right, 10 reps, Seated   Shoulder Instructions Shoulder Instructions Donning/doffing shirt without moving shoulder: Moderate assistance Method for sponge bathing under operated UE: Maximal assistance Donning/doffing sling/immobilizer: Maximal assistance;Patient able to independently direct caregiver Correct positioning of sling/immobilizer: Maximal assistance ROM for elbow, wrist and digits of operated UE: Maximal assistance;Patient able to independently direct caregiver Sling wearing schedule (on at all times/off for ADL's): Supervision/safety Proper positioning of operated UE when showering: Supervision/safety Positioning of UE while sleeping: Supervision/safety    Home Living Family/patient expects to be discharged to:: Private  residence Living Arrangements: Other relatives Available Help at Discharge: Family;Available 24 hours/day Type of Home: Other(Comment) (town house) Home Access: Stairs to enter Secretary/administrator of Steps: 1   Home Layout: Two level;Bed/bath upstairs;1/2 bath on main level Alternate Level Stairs-Number of Steps: 14 Alternate Level Stairs-Rails: Left Bathroom Shower/Tub: Chief Strategy Officer: Standard     Home Equipment: Cane - single point;Grab bars - tub/shower          Prior Functioning/Environment Prior Level of Function : Independent/Modified Independent;Driving             Mobility Comments: uses SPC at all times ADLs Comments: ind ADLs, sister assist as needed    OT Problem List: Decreased strength;Decreased activity tolerance;Impaired balance (sitting and/or standing);Decreased knowledge of use of DME or AE;Decreased knowledge of precautions;Impaired UE functional use   OT Treatment/Interventions: Self-care/ADL training;Therapeutic exercise;DME and/or AE instruction;Therapeutic activities;Patient/family education;Balance training      OT Goals(Current goals can be found in the care plan section)   Acute Rehab OT Goals Patient Stated Goal: home OT Goal Formulation: With patient Time For Goal Achievement: 05/09/24 Potential to Achieve Goals: Good   OT Frequency:  Min 3X/week    Co-evaluation              AM-PAC OT 6 Clicks Daily Activity     Outcome Measure Help from another person eating meals?: None Help from another person taking  care of personal grooming?: A Little Help from another person toileting, which includes using toliet, bedpan, or urinal?: A Little Help from another person bathing (including washing, rinsing, drying)?: A Lot Help from another person to put on and taking off regular upper body clothing?: A Lot Help from another person to put on and taking off regular lower body clothing?: A Little 6 Click Score: 17    End of Session Equipment Utilized During Treatment: Other (comment) (sling, cane) Nurse Communication: Mobility status;Precautions  Activity Tolerance: Patient tolerated treatment well Patient left: in chair;with call bell/phone within reach;with chair alarm set  OT Visit Diagnosis: Other abnormalities of gait and mobility (R26.89);Muscle weakness (generalized) (M62.81)                Time: 9178-9147 OT Time Calculation (min): 31 min Charges:  OT General Charges $OT Visit: 1 Visit OT Evaluation $OT Eval Low Complexity: 1 Low OT Treatments $Self Care/Home Management : 8-22 mins  Etta NOVAK, OT Acute Rehabilitation Services Office [phone] Secure Chat Preferred    Etta GORMAN Hope 04/25/2024, 9:04 AM

## 2024-04-25 NOTE — Progress Notes (Signed)
 Discharge med ina secure bag delivered to patient in room by this RN

## 2024-04-25 NOTE — Progress Notes (Signed)
 Orthopedics Progress Note  Subjective: Patient still comfortable with the block working. Minimal pain  Objective:  Vitals:   04/25/24 0259 04/25/24 0545  BP: (!) 129/94 (!) 147/77  Pulse: 92 69  Resp: 14 15  Temp: 98.1 F (36.7 C) (!) 97.3 F (36.3 C)  SpO2: 97% 96%    General: Awake and alert  Musculoskeletal: right shoulder dressing changed to the Aquacel bandage. Min swelling and bruising Neurovascularly intact  Lab Results  Component Value Date   WBC 5.7 04/14/2024   HGB 12.9 04/24/2024   HCT 38.0 04/24/2024   MCV 106.8 (H) 04/14/2024   PLT 161 04/14/2024       Component Value Date/Time   NA 141 04/24/2024 1045   NA 134 (A) 10/30/2022 1210   NA 138 04/19/2017 1005   K 3.9 04/24/2024 1045   K 4.0 04/19/2017 1005   CL 106 04/24/2024 1045   CO2 26 04/24/2024 1045   CO2 25 04/19/2017 1005   GLUCOSE 73 04/24/2024 1045   GLUCOSE 87 04/19/2017 1005   BUN 19 04/24/2024 1045   BUN 19 10/30/2022 1210   BUN 12.0 04/19/2017 1005   CREATININE 0.64 04/24/2024 1045   CREATININE 0.77 11/26/2017 1544   CREATININE 0.7 04/19/2017 1005   CALCIUM  8.9 04/24/2024 1045   CALCIUM  8.9 04/19/2017 1005   GFRNONAA >60 04/24/2024 1045   GFRNONAA >60 11/26/2017 1544   GFRAA 112 11/26/2019 1359   GFRAA >60 11/26/2017 1544    Lab Results  Component Value Date   INR 1.00 10/30/2022    Assessment/Plan: POD #1 s/p Procedure(s): ARTHROPLASTY, SHOULDER, TOTAL, REVERSE Stable overnight. Home today after OT. Home exercises only, no formal therapy Follow up in two weeks in the office  Elspeth R. Kay, MD 04/25/2024 8:29 AM

## 2024-04-26 DIAGNOSIS — Z96611 Presence of right artificial shoulder joint: Secondary | ICD-10-CM | POA: Diagnosis not present

## 2024-04-27 ENCOUNTER — Encounter (HOSPITAL_COMMUNITY): Payer: Self-pay | Admitting: Orthopedic Surgery

## 2024-04-27 ENCOUNTER — Other Ambulatory Visit (HOSPITAL_BASED_OUTPATIENT_CLINIC_OR_DEPARTMENT_OTHER): Payer: Self-pay

## 2024-05-05 ENCOUNTER — Other Ambulatory Visit (HOSPITAL_BASED_OUTPATIENT_CLINIC_OR_DEPARTMENT_OTHER): Payer: Self-pay

## 2024-05-05 MED ORDER — CIPROFLOXACIN HCL 500 MG PO TABS
500.0000 mg | ORAL_TABLET | Freq: Two times a day (BID) | ORAL | 0 refills | Status: AC
  Filled 2024-05-05: qty 10, 5d supply, fill #0

## 2024-05-06 ENCOUNTER — Other Ambulatory Visit (HOSPITAL_BASED_OUTPATIENT_CLINIC_OR_DEPARTMENT_OTHER): Payer: Self-pay

## 2024-05-06 DIAGNOSIS — Z4789 Encounter for other orthopedic aftercare: Secondary | ICD-10-CM | POA: Diagnosis not present

## 2024-05-06 MED ORDER — CELECOXIB 200 MG PO CAPS
200.0000 mg | ORAL_CAPSULE | Freq: Every day | ORAL | 2 refills | Status: AC
  Filled 2024-05-06: qty 30, 30d supply, fill #0
  Filled 2024-06-05: qty 30, 30d supply, fill #1

## 2024-05-07 ENCOUNTER — Other Ambulatory Visit (HOSPITAL_BASED_OUTPATIENT_CLINIC_OR_DEPARTMENT_OTHER): Payer: Self-pay

## 2024-05-08 ENCOUNTER — Other Ambulatory Visit: Payer: Self-pay

## 2024-05-11 ENCOUNTER — Other Ambulatory Visit (HOSPITAL_BASED_OUTPATIENT_CLINIC_OR_DEPARTMENT_OTHER): Payer: Self-pay

## 2024-05-11 MED ORDER — METHOCARBAMOL 750 MG PO TABS
750.0000 mg | ORAL_TABLET | Freq: Three times a day (TID) | ORAL | 1 refills | Status: AC
  Filled 2024-05-11: qty 60, 20d supply, fill #0

## 2024-05-12 ENCOUNTER — Other Ambulatory Visit (HOSPITAL_BASED_OUTPATIENT_CLINIC_OR_DEPARTMENT_OTHER): Payer: Self-pay

## 2024-05-12 MED ORDER — SILVER SULFADIAZINE 1 % EX CREA
TOPICAL_CREAM | CUTANEOUS | 0 refills | Status: AC
  Filled 2024-05-12: qty 400, 90d supply, fill #0

## 2024-05-13 ENCOUNTER — Encounter: Payer: Self-pay | Admitting: Hematology and Oncology

## 2024-05-13 ENCOUNTER — Other Ambulatory Visit (HOSPITAL_BASED_OUTPATIENT_CLINIC_OR_DEPARTMENT_OTHER): Payer: Self-pay

## 2024-05-13 MED ORDER — LORATADINE 10 MG PO TABS
10.0000 mg | ORAL_TABLET | Freq: Every day | ORAL | 0 refills | Status: AC
  Filled 2024-05-13: qty 100, 100d supply, fill #0

## 2024-05-18 ENCOUNTER — Other Ambulatory Visit (HOSPITAL_BASED_OUTPATIENT_CLINIC_OR_DEPARTMENT_OTHER): Payer: Self-pay

## 2024-05-26 ENCOUNTER — Other Ambulatory Visit (HOSPITAL_BASED_OUTPATIENT_CLINIC_OR_DEPARTMENT_OTHER): Payer: Self-pay

## 2024-05-26 MED ORDER — DIAZEPAM 5 MG PO TABS
ORAL_TABLET | ORAL | 0 refills | Status: AC
  Filled 2024-05-26 – 2024-06-08 (×2): qty 1, 1d supply, fill #0

## 2024-06-08 ENCOUNTER — Other Ambulatory Visit (HOSPITAL_BASED_OUTPATIENT_CLINIC_OR_DEPARTMENT_OTHER): Payer: Self-pay

## 2024-06-08 MED ORDER — HYDROCODONE-ACETAMINOPHEN 5-325 MG PO TABS
1.0000 | ORAL_TABLET | ORAL | 0 refills | Status: AC | PRN
  Filled 2024-06-08: qty 12, 2d supply, fill #0

## 2024-06-08 MED ORDER — CLINDAMYCIN HCL 300 MG PO CAPS
300.0000 mg | ORAL_CAPSULE | Freq: Three times a day (TID) | ORAL | 1 refills | Status: AC
  Filled 2024-06-08: qty 30, 10d supply, fill #0

## 2024-06-09 ENCOUNTER — Other Ambulatory Visit (HOSPITAL_COMMUNITY): Payer: Self-pay

## 2024-06-11 ENCOUNTER — Other Ambulatory Visit (HOSPITAL_BASED_OUTPATIENT_CLINIC_OR_DEPARTMENT_OTHER): Payer: Self-pay

## 2024-06-15 ENCOUNTER — Other Ambulatory Visit (HOSPITAL_BASED_OUTPATIENT_CLINIC_OR_DEPARTMENT_OTHER): Payer: Self-pay

## 2024-06-23 ENCOUNTER — Encounter: Payer: Self-pay | Admitting: Cardiovascular Disease

## 2024-06-25 NOTE — Telephone Encounter (Signed)
 I think an even better course of action is to take a statin holiday, to make sure that the statin is truly the culprit. The medication is not critical in the short term, but necessary for long term risk reduction. Go ahead and stop the rosuvastatin  completely for 2 months. If the leg pain is no better, we cannot blame the statin. If it improves, we can find an alternative cholesterol reducing agent. 2 months off the statin will make no difference in the grand scheme of things.

## 2024-06-25 NOTE — Telephone Encounter (Signed)
 No need for repeat blood work.  Thanks
